# Patient Record
Sex: Male | Born: 1955 | Race: Black or African American | Hispanic: No | Marital: Married | Smoking: Former smoker
Health system: Southern US, Community
[De-identification: ages and names within clinical notes are randomized; demographics above are authoritative.]

## PROBLEM LIST (undated history)

## (undated) ENCOUNTER — Telehealth

## (undated) ENCOUNTER — Encounter: Attending: Family | Primary: Family

## (undated) ENCOUNTER — Encounter: Attending: Hematology & Oncology | Primary: Hematology & Oncology

## (undated) ENCOUNTER — Non-Acute Institutional Stay: Payer: PRIVATE HEALTH INSURANCE

## (undated) ENCOUNTER — Encounter: Payer: PRIVATE HEALTH INSURANCE | Attending: Family | Primary: Family

## (undated) ENCOUNTER — Telehealth
Attending: Pharmacist Clinician (PhC)/ Clinical Pharmacy Specialist | Primary: Pharmacist Clinician (PhC)/ Clinical Pharmacy Specialist

## (undated) ENCOUNTER — Encounter

## (undated) ENCOUNTER — Ambulatory Visit

## (undated) ENCOUNTER — Telehealth: Attending: Family | Primary: Family

## (undated) ENCOUNTER — Ambulatory Visit
Attending: Pharmacist Clinician (PhC)/ Clinical Pharmacy Specialist | Primary: Pharmacist Clinician (PhC)/ Clinical Pharmacy Specialist

## (undated) ENCOUNTER — Encounter
Attending: Pharmacist Clinician (PhC)/ Clinical Pharmacy Specialist | Primary: Pharmacist Clinician (PhC)/ Clinical Pharmacy Specialist

## (undated) ENCOUNTER — Ambulatory Visit: Payer: PRIVATE HEALTH INSURANCE

## (undated) ENCOUNTER — Encounter: Attending: Sports Medicine | Primary: Sports Medicine

## (undated) ENCOUNTER — Ambulatory Visit: Payer: PRIVATE HEALTH INSURANCE | Attending: Family | Primary: Family

## (undated) DIAGNOSIS — B1920 Unspecified viral hepatitis C without hepatic coma: Secondary | ICD-10-CM

## (undated) DIAGNOSIS — C189 Malignant neoplasm of colon, unspecified: Secondary | ICD-10-CM

## (undated) DIAGNOSIS — M1990 Unspecified osteoarthritis, unspecified site: Secondary | ICD-10-CM

## (undated) DIAGNOSIS — I10 Essential (primary) hypertension: Secondary | ICD-10-CM

## (undated) HISTORY — PX: SHOULDER ARTHROSCOPY: SHX128

## (undated) HISTORY — DX: Malignant neoplasm of colon, unspecified: C18.9

## (undated) HISTORY — DX: Essential (primary) hypertension: I10

## (undated) HISTORY — PX: WRIST SURGERY: SHX841

## (undated) HISTORY — PX: COLON SURGERY: SHX602

## (undated) HISTORY — PX: REPLACEMENT TOTAL KNEE: SUR1224

## (undated) MED FILL — Fluorouracil IV Soln 5 GM/100ML (50 MG/ML): INTRAVENOUS | Qty: 70 | Status: AC

## (undated) MED FILL — Fluorouracil IV Soln 2.5 GM/50ML (50 MG/ML): INTRAVENOUS | Qty: 12 | Status: AC

## (undated) MED FILL — Fluorouracil IV Soln 5 GM/100ML (50 MG/ML): INTRAVENOUS | Qty: 100 | Status: AC

---
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
## 2013-11-19 ENCOUNTER — Encounter: Payer: Self-pay | Admitting: Family

## 2013-11-19 ENCOUNTER — Ambulatory Visit (INDEPENDENT_AMBULATORY_CARE_PROVIDER_SITE_OTHER): Payer: Managed Care, Other (non HMO) | Admitting: Family

## 2013-11-19 VITALS — BP 140/90 | Temp 98.4°F | Ht 70.75 in | Wt 233.0 lb

## 2013-11-19 DIAGNOSIS — M79604 Pain in right leg: Secondary | ICD-10-CM

## 2013-11-19 DIAGNOSIS — IMO0002 Reserved for concepts with insufficient information to code with codable children: Secondary | ICD-10-CM

## 2013-11-19 DIAGNOSIS — M79609 Pain in unspecified limb: Secondary | ICD-10-CM

## 2013-11-19 DIAGNOSIS — M5416 Radiculopathy, lumbar region: Secondary | ICD-10-CM

## 2013-11-19 MED ORDER — IBUPROFEN 800 MG PO TABS: 800.0000 mg | ORAL_TABLET | Freq: Three times a day (TID) | ORAL | Status: DC | PRN

## 2013-11-19 MED ORDER — TRAMADOL HCL 50 MG PO TABS: 50.0000 mg | ORAL_TABLET | Freq: Two times a day (BID) | ORAL | Status: DC | PRN

## 2013-11-19 NOTE — Patient Instructions (Signed)
Back Exercises These exercises may help you when beginning to rehabilitate your injury. Your symptoms may resolve with or without further involvement from your physician, physical therapist or athletic trainer. While completing these exercises, remember:   Restoring tissue flexibility helps normal motion to return to the joints. This allows healthier, less painful movement and activity.  An effective stretch should be held for at least 30 seconds.  A stretch should never be painful. You should only feel a gentle lengthening or release in the stretched tissue. STRETCH  Extension, Prone on Elbows   Lie on your stomach on the floor, a bed will be too soft. Place your palms about shoulder width apart and at the height of your head.  Place your elbows under your shoulders. If this is too painful, stack pillows under your chest.  Allow your body to relax so that your hips drop lower and make contact more completely with the floor.  Hold this position for __________ seconds.  Slowly return to lying flat on the floor. Repeat __________ times. Complete this exercise __________ times per day.  RANGE OF MOTION  Extension, Prone Press Ups   Lie on your stomach on the floor, a bed will be too soft. Place your palms about shoulder width apart and at the height of your head.  Keeping your back as relaxed as possible, slowly straighten your elbows while keeping your hips on the floor. You may adjust the placement of your hands to maximize your comfort. As you gain motion, your hands will come more underneath your shoulders.  Hold this position __________ seconds.  Slowly return to lying flat on the floor. Repeat __________ times. Complete this exercise __________ times per day.  RANGE OF MOTION- Quadruped, Neutral Spine   Assume a hands and knees position on a firm surface. Keep your hands under your shoulders and your knees under your hips. You may place padding under your knees for comfort.  Drop  your head and point your tail bone toward the ground below you. This will round out your low back like an angry cat. Hold this position for __________ seconds.  Slowly lift your head and release your tail bone so that your back sags into a large arch, like an old horse.  Hold this position for __________ seconds.  Repeat this until you feel limber in your low back.  Now, find your "sweet spot." This will be the most comfortable position somewhere between the two previous positions. This is your neutral spine. Once you have found this position, tense your stomach muscles to support your low back.  Hold this position for __________ seconds. Repeat __________ times. Complete this exercise __________ times per day.  STRETCH  Flexion, Single Knee to Chest   Lie on a firm bed or floor with both legs extended in front of you.  Keeping one leg in contact with the floor, bring your opposite knee to your chest. Hold your leg in place by either grabbing behind your thigh or at your knee.  Pull until you feel a gentle stretch in your low back. Hold __________ seconds.  Slowly release your grasp and repeat the exercise with the opposite side. Repeat __________ times. Complete this exercise __________ times per day.  STRETCH - Hamstrings, Standing  Stand or sit and extend your right / left leg, placing your foot on a chair or foot stool  Keeping a slight arch in your low back and your hips straight forward.  Lead with your chest and   lean forward at the waist until you feel a gentle stretch in the back of your right / left knee or thigh. (When done correctly, this exercise requires leaning only a small distance.)  Hold this position for __________ seconds. Repeat __________ times. Complete this stretch __________ times per day. STRENGTHENING  Deep Abdominals, Pelvic Tilt   Lie on a firm bed or floor. Keeping your legs in front of you, bend your knees so they are both pointed toward the ceiling and  your feet are flat on the floor.  Tense your lower abdominal muscles to press your low back into the floor. This motion will rotate your pelvis so that your tail bone is scooping upwards rather than pointing at your feet or into the floor.  With a gentle tension and even breathing, hold this position for __________ seconds. Repeat __________ times. Complete this exercise __________ times per day.  STRENGTHENING  Abdominals, Crunches   Lie on a firm bed or floor. Keeping your legs in front of you, bend your knees so they are both pointed toward the ceiling and your feet are flat on the floor. Cross your arms over your chest.  Slightly tip your chin down without bending your neck.  Tense your abdominals and slowly lift your trunk high enough to just clear your shoulder blades. Lifting higher can put excessive stress on the low back and does not further strengthen your abdominal muscles.  Control your return to the starting position. Repeat __________ times. Complete this exercise __________ times per day.  STRENGTHENING  Quadruped, Opposite UE/LE Lift   Assume a hands and knees position on a firm surface. Keep your hands under your shoulders and your knees under your hips. You may place padding under your knees for comfort.  Find your neutral spine and gently tense your abdominal muscles so that you can maintain this position. Your shoulders and hips should form a rectangle that is parallel with the floor and is not twisted.  Keeping your trunk steady, lift your right hand no higher than your shoulder and then your left leg no higher than your hip. Make sure you are not holding your breath. Hold this position __________ seconds.  Continuing to keep your abdominal muscles tense and your back steady, slowly return to your starting position. Repeat with the opposite arm and leg. Repeat __________ times. Complete this exercise __________ times per day. Document Released: 12/15/2005 Document  Revised: 02/19/2012 Document Reviewed: 03/11/2009 [facility] Patient Information 2014 [place], [place].    Lumbosacral Radiculopathy Lumbosacral radiculopathy is a pinched nerve or nerves in the low back (lumbosacral area). When this happens you may have weakness in your legs and may not be able to stand on your toes. You may have pain going down into your legs. There may be difficulties with walking normally. There are many causes of this problem. Sometimes this may happen from an injury, or simply from arthritis or boney problems. It may also be caused by other illnesses such as diabetes. If there is no improvement after treatment, further studies may be done to find the exact cause. DIAGNOSIS  X-rays may be needed if the problems become long standing. Electromyograms may be done. This study is one in which the working of nerves and muscles is studied. HOME CARE INSTRUCTIONS   Applications of ice packs may be helpful. Ice can be used in a plastic bag with a towel around it to prevent frostbite to skin. This may be used every 2 hours for 20  to 30 minutes, or as needed, while awake, or as directed by your caregiver.  Only take over-the-counter or prescription medicines for pain, discomfort, or fever as directed by your caregiver.  If physical therapy was prescribed, follow your caregiver's directions. SEEK IMMEDIATE MEDICAL CARE IF:   You have pain not controlled with medications.  You seem to be getting worse rather than better.  You develop increasing weakness in your legs.  You develop loss of bowel or bladder control.  You have difficulty with walking or balance, or develop clumsiness in the use of your legs.  You have a fever. MAKE SURE YOU:   Understand these instructions.  Will watch your condition.  Will get help right away if you are not doing well or get worse. Document Released: 11/27/2005 Document Revised: 02/19/2012 Document Reviewed: 07/17/2008 [facility] Patient  Information 2014 [place], [place].

## 2013-11-19 NOTE — Progress Notes (Signed)
Subjective:    Patient ID: Evan [name], male    DOB: December 28, 1955, 57 y.o.   MRN: [id number]  HPI  This 57 year old the patient to the practice is then to be established. He has complaints of right upper leg pain ongoing several months for which she's been taken 100 mg ibuprofen and tramadol for relief. He is a Education administrator. With the pain tends to be worse after a days work. The symptoms and pain are relieved with tramadol and ibuprofen. He has a history of a bulging disc to the L-spine. Denies any current back pain. He rates the pain as 10 out of 10 when it occurs and describes it as a catch and sharp. He has not seen a primary care provider in over 30 years. Reports a history of drug dependence in the past.   Review of Systems  Constitutional: Negative.   HENT: Negative.   Respiratory: Negative.   Cardiovascular: Negative.   Gastrointestinal: Negative.   Endocrine: Negative.   Genitourinary: Negative.   Musculoskeletal: Negative.   Skin: Negative.   Allergic/Immunologic: Negative.   Neurological: Negative.   Hematological: Negative.   Psychiatric/Behavioral: Negative.    History reviewed. No pertinent past medical history.  History   Social History  . Marital Status: Widowed    Spouse Name: N/A    Number of Children: N/A  . Years of Education: N/A   Occupational History  . Not on file.   Social History Main Topics  . Smoking status: Former Smoker    Quit date: 11/19/2005  . Smokeless tobacco: Not on file  . Alcohol Use: No  . Drug Use: No  . Sexual Activity: Not on file   Other Topics Concern  . Not on file   Social History Narrative  . No narrative on file    Past Surgical History  Procedure Laterality Date  . Shoulder arthroscopy    . Wrist surgery Left     Family History  Problem Relation Age of Onset  . Heart disease Mother   . Dementia Father     No Known Allergies  No current outpatient prescriptions on file prior to visit.   No current  facility-administered medications on file prior to visit.    BP 140/90  Temp(Src) 98.4 F (36.9 C) (Oral)  Ht 5' 10.75" (1.797 m)  Wt 233 lb (105.688 kg)  BMI 32.73 kg/m2chart    Objective:   Physical Exam  Constitutional: He is oriented to person, place, and time. He appears well-developed and well-nourished.  HENT:  Right Ear: External ear normal.  Left Ear: External ear normal.  Nose: Nose normal.  Mouth/Throat: Oropharynx is clear and moist.  Neck: Normal range of motion. Neck supple. No thyromegaly present.  Cardiovascular: Normal rate, regular rhythm and normal heart sounds.   Pulmonary/Chest: Effort normal and breath sounds normal.  Abdominal: Soft. Bowel sounds are normal.  Musculoskeletal: Normal range of motion. He exhibits no edema and no tenderness.  Neurological: He is alert and oriented to person, place, and time. He has normal reflexes. He displays normal reflexes. No cranial nerve deficit. Coordination normal.  Skin: Skin is warm and dry.  Psychiatric: He has a normal mood and affect.          Assessment & Plan:  Assessment: 1. Lumbar radiculopathy 2. Right leg pain  Plan: Low back strengthening exercises provided for patient today. Refill prescription for tramadol as needed and ibuprofen 3 times a day with food. Consider Prilosec daily. Will bring  patient back for recheck and a complete physical exam in 2-3 weeks and sooner as needed fasting.

## 2013-11-20 LAB — COMPREHENSIVE METABOLIC PANEL
ALT: 53 U/L (ref 0–53)
AST: 59 U/L — ABNORMAL HIGH (ref 0–37)
Alkaline Phosphatase: 55 U/L (ref 39–117)
BUN: 10 mg/dL (ref 6–23)
Creatinine, Ser: 1 mg/dL (ref 0.4–1.5)
GFR: 102.91 mL/min (ref >60.00)
Total Bilirubin: 0.8 mg/dL (ref 0.3–1.2)

## 2013-12-09 ENCOUNTER — Ambulatory Visit: Payer: Managed Care, Other (non HMO)

## 2013-12-09 ENCOUNTER — Other Ambulatory Visit (INDEPENDENT_AMBULATORY_CARE_PROVIDER_SITE_OTHER): Payer: Managed Care, Other (non HMO)

## 2013-12-09 DIAGNOSIS — D729 Disorder of white blood cells, unspecified: Secondary | ICD-10-CM

## 2013-12-09 DIAGNOSIS — Z0000 Encounter for general adult medical examination without abnormal findings: Secondary | ICD-10-CM

## 2013-12-09 LAB — CBC WITH DIFFERENTIAL/PLATELET
Basophils Relative: 0.6 % (ref 0.0–3.0)
Eosinophils Absolute: 0.1 10*3/uL (ref 0.0–0.7)
Hemoglobin: 14.4 g/dL (ref 13.0–17.0)
MCHC: 33.7 g/dL (ref 30.0–36.0)
MCV: 95.3 fl (ref 78.0–100.0)
Monocytes Absolute: 0.7 10*3/uL (ref 0.1–1.0)
Neutro Abs: 2.5 10*3/uL (ref 1.4–7.7)
Neutrophils Relative %: 30.6 % — ABNORMAL LOW (ref 43.0–77.0)
RBC: 4.48 Mil/uL (ref 4.22–5.81)

## 2013-12-09 LAB — HEPATIC FUNCTION PANEL
Albumin: 3.9 g/dL (ref 3.5–5.2)
Bilirubin, Direct: 0.3 mg/dL (ref 0.0–0.3)
Total Protein: 7.2 g/dL (ref 6.0–8.3)

## 2013-12-09 LAB — BASIC METABOLIC PANEL
CO2: 26 mEq/L (ref 19–32)
Chloride: 104 mEq/L (ref 96–112)
Creatinine, Ser: 1 mg/dL (ref 0.4–1.5)
Glucose, Bld: 134 mg/dL — ABNORMAL HIGH (ref 70–99)
Potassium: 3.6 mEq/L (ref 3.5–5.1)
Sodium: 138 mEq/L (ref 135–145)

## 2013-12-09 LAB — PSA: PSA: 0.37 ng/mL (ref 0.10–4.00)

## 2013-12-09 LAB — POCT URINALYSIS DIPSTICK
Ketones, UA: NEGATIVE
Leukocytes, UA: NEGATIVE
Nitrite, UA: NEGATIVE
Protein, UA: NEGATIVE
pH, UA: 6

## 2013-12-10 ENCOUNTER — Telehealth: Payer: Self-pay

## 2013-12-10 DIAGNOSIS — R7989 Other specified abnormal findings of blood chemistry: Secondary | ICD-10-CM

## 2013-12-10 LAB — PATHOLOGIST SMEAR REVIEW

## 2013-12-10 MED ORDER — DICLOFENAC SODIUM 75 MG PO TBEC: 75.0000 mg | DELAYED_RELEASE_TABLET | Freq: Two times a day (BID) | ORAL | Status: DC

## 2013-12-10 NOTE — Telephone Encounter (Signed)
Message copied by Beverely Low on Wed Dec 10, 2013  1:37 PM ------      Message from: Adline Mango B      Created: Wed Dec 10, 2013  8:40 AM       Elevated LFTs. Needs ultrasound of the liver. Avoid NSAIDS, Tylenol, alcohol. ------

## 2013-12-16 ENCOUNTER — Ambulatory Visit (INDEPENDENT_AMBULATORY_CARE_PROVIDER_SITE_OTHER): Payer: Managed Care, Other (non HMO) | Admitting: Family

## 2013-12-16 ENCOUNTER — Encounter: Payer: Self-pay | Admitting: Family

## 2013-12-16 VITALS — BP 140/90 | HR 62 | Ht 70.75 in | Wt 229.0 lb

## 2013-12-16 DIAGNOSIS — R7309 Other abnormal glucose: Secondary | ICD-10-CM

## 2013-12-16 DIAGNOSIS — R739 Hyperglycemia, unspecified: Secondary | ICD-10-CM

## 2013-12-16 DIAGNOSIS — R945 Abnormal results of liver function studies: Secondary | ICD-10-CM

## 2013-12-16 DIAGNOSIS — Z0000 Encounter for general adult medical examination without abnormal findings: Secondary | ICD-10-CM

## 2013-12-16 DIAGNOSIS — R7989 Other specified abnormal findings of blood chemistry: Secondary | ICD-10-CM

## 2013-12-16 LAB — HEPATIC FUNCTION PANEL
ALK PHOS: 64 U/L (ref 39–117)
ALT: 65 U/L — AB (ref 0–53)
AST: 63 U/L — ABNORMAL HIGH (ref 0–37)
Albumin: 4.2 g/dL (ref 3.5–5.2)
BILIRUBIN DIRECT: 0.2 mg/dL (ref 0.0–0.3)
Total Bilirubin: 1.1 mg/dL (ref 0.3–1.2)
Total Protein: 8.3 g/dL (ref 6.0–8.3)

## 2013-12-16 LAB — HEMOGLOBIN A1C: Hgb A1c MFr Bld: 5.4 % (ref 4.6–6.5)

## 2013-12-16 NOTE — Progress Notes (Signed)
Pre visit review using our clinic review tool, if applicable. No additional management support is needed unless otherwise documented below in the visit note. 

## 2013-12-16 NOTE — Progress Notes (Signed)
Subjective:    Patient ID: Evan [name], male    DOB: 1956-04-28, 58 y.o.   MRN: [id number]  HPI 58 year old [place] American male, nonsmoker  Patient presents for yearly preventative medicine examination. All immunizations and health maintenance protocols were reviewed with the patient and they are up to date with these protocols. Screening laboratory values were reviewed with the patient including screening of hyperlipidemia PSA renal function and hepatic function. There medications past medical history social history problem list and allergies were reviewed in detail.     Review of Systems  Constitutional: Negative.   HENT: Negative.   Eyes: Negative.   Respiratory: Negative.   Cardiovascular: Negative.   Gastrointestinal: Negative.   Endocrine: Negative.   Genitourinary: Negative.   Musculoskeletal: Negative.   Skin: Negative.   Allergic/Immunologic: Negative.   Neurological: Negative.   Hematological: Negative.   Psychiatric/Behavioral: Negative.    History reviewed. No pertinent past medical history.  History   Social History  . Marital Status: Widowed    Spouse Name: N/A    Number of Children: N/A  . Years of Education: N/A   Occupational History  . Not on file.   Social History Main Topics  . Smoking status: Former Smoker    Quit date: 11/19/2005  . Smokeless tobacco: Not on file  . Alcohol Use: No  . Drug Use: No  . Sexual Activity: Not on file   Other Topics Concern  . Not on file   Social History Narrative  . No narrative on file    Past Surgical History  Procedure Laterality Date  . Shoulder arthroscopy    . Wrist surgery Left     Family History  Problem Relation Age of Onset  . Heart disease Mother   . Dementia Father     No Known Allergies  Current Outpatient Prescriptions on File Prior to Visit  Medication Sig Dispense Refill  . diclofenac (VOLTAREN) 75 MG EC tablet Take 1 tablet (75 mg total) by mouth 2 (two) times daily.   60 tablet  4  . traMADol (ULTRAM) 50 MG tablet Take 1 tablet (50 mg total) by mouth 2 (two) times daily as needed.  30 tablet  0   No current facility-administered medications on file prior to visit.    BP 140/90  Pulse 62  Ht 5' 10.75" (1.797 m)  Wt 229 lb (103.874 kg)  BMI 32.17 kg/m2chart    Objective:   Physical Exam  Constitutional: He is oriented to person, place, and time. He appears well-developed and well-nourished.  HENT:  Head: Normocephalic and atraumatic.  Right Ear: External ear normal.  Left Ear: External ear normal.  Nose: Nose normal.  Mouth/Throat: Oropharynx is clear and moist.  Eyes: Conjunctivae and EOM are normal. Pupils are equal, round, and reactive to light.  Neck: Normal range of motion. Neck supple.  Cardiovascular: Normal rate, regular rhythm and normal heart sounds.   Pulmonary/Chest: Breath sounds normal.  Abdominal: Soft. Bowel sounds are normal.  Genitourinary: Rectum normal, prostate normal and penis normal.  Musculoskeletal: Normal range of motion. He exhibits no tenderness.  Neurological: He is alert and oriented to person, place, and time. He displays normal reflexes. No cranial nerve deficit. Coordination normal.  Skin: Skin is warm and dry.  Psychiatric: He has a normal mood and affect.  EKG in office.        Assessment & Plan:  Assessment: 1. CPX 2. Elevated LFT related to chrinic Ibuprofen usage 3. Hyperglycemia  Plan Referral to [facility] for liver ultrasound.   Obtain labs that include LFT and hemoglobin A1C.  Continue leg exercises.  Contact office for any questions or concerns. Proceed with Ultrasound if still elevated.

## 2013-12-16 NOTE — Patient Instructions (Signed)
High Blood Sugar High blood sugar (hyperglycemia) means that the level of sugar in your blood is higher than it should be. Signs of high blood sugar include:  Feeling thirsty.  Frequent peeing (urinating).  Feeling tired or sleepy.  Dry mouth.  Vision changes.  Feeling weak.  Feeling hungry but losing weight.  Numbness and tingling in your hands or feet.  Headache. When you ignore these signs, your blood sugar may keep going up. These problems may get worse, and other problems may begin. HOME CARE  Check your blood sugars as told by your doctor. Write down the numbers with the date and time.  Take the right amount of insulin or diabetes pills at the right time. Write down the dose with date and time.  Refill your insulin or diabetes pills before running out.  Watch what you eat. Follow your meal plan.  Drink liquids without sugar, such as water. Check with your doctor if you have kidney or heart disease.  Follow your doctor's orders for exercise. Exercise at the same time of day.  Keep your doctor's appointments. GET HELP RIGHT AWAY IF:   You have trouble thinking or are confused.  You have fast breathing with fruity smelling breath.  You pass out (faint).  You have 2 to 3 days of high blood sugars and you do not know why.  You have chest pain.  You are feeling sick to your stomach (nauseous) or throwing up (vomiting).  You have sudden vision changes. MAKE SURE YOU:   Understand these instructions.  Will watch your condition.  Will get help right away if you are not doing well or get worse. Document Released: 09/24/2009 Document Revised: 02/19/2012 Document Reviewed: 09/24/2009 [facility] Patient Information 2014 [place], [place].

## 2013-12-18 ENCOUNTER — Other Ambulatory Visit: Payer: Self-pay | Admitting: Family

## 2013-12-18 ENCOUNTER — Other Ambulatory Visit (INDEPENDENT_AMBULATORY_CARE_PROVIDER_SITE_OTHER): Payer: Managed Care, Other (non HMO)

## 2013-12-18 DIAGNOSIS — D72829 Elevated white blood cell count, unspecified: Secondary | ICD-10-CM

## 2013-12-18 LAB — CBC WITH DIFFERENTIAL/PLATELET
BASOS ABS: 0 10*3/uL (ref 0.0–0.1)
Basophils Relative: 0.5 % (ref 0.0–3.0)
EOS ABS: 0.1 10*3/uL (ref 0.0–0.7)
Eosinophils Relative: 1.7 % (ref 0.0–5.0)
HCT: 42.2 % (ref 39.0–52.0)
Hemoglobin: 14.2 g/dL (ref 13.0–17.0)
Lymphocytes Relative: 58 % — ABNORMAL HIGH (ref 12.0–46.0)
Lymphs Abs: 4.5 10*3/uL — ABNORMAL HIGH (ref 0.7–4.0)
MCHC: 33.7 g/dL (ref 30.0–36.0)
MCV: 94.4 fl (ref 78.0–100.0)
MONO ABS: 0.5 10*3/uL (ref 0.1–1.0)
Monocytes Relative: 7 % (ref 3.0–12.0)
NEUTROS ABS: 2.5 10*3/uL (ref 1.4–7.7)
NEUTROS PCT: 32.8 % — AB (ref 43.0–77.0)
Platelets: 162 10*3/uL (ref 150.0–400.0)
RBC: 4.47 Mil/uL (ref 4.22–5.81)
RDW: 13 % (ref 11.5–14.6)
WBC: 7.8 10*3/uL (ref 4.5–10.5)

## 2013-12-19 LAB — HIV ANTIBODY (ROUTINE TESTING W REFLEX): HIV: NONREACTIVE

## 2013-12-22 ENCOUNTER — Ambulatory Visit
Admission: RE | Admit: 2013-12-22 | Discharge: 2013-12-22 | Disposition: A | Discharge status: Home / Self Care | Payer: Managed Care, Other (non HMO) | Source: Ambulatory Visit | Attending: Family | Admitting: Family

## 2013-12-22 DIAGNOSIS — R7989 Other specified abnormal findings of blood chemistry: Secondary | ICD-10-CM

## 2013-12-22 DIAGNOSIS — R945 Abnormal results of liver function studies: Principal | ICD-10-CM

## 2014-01-07 ENCOUNTER — Encounter: Payer: Self-pay | Admitting: Family

## 2014-02-24 ENCOUNTER — Telehealth: Payer: Self-pay | Admitting: Family

## 2014-02-24 MED ORDER — IBUPROFEN 600 MG PO TABS: 600.0000 mg | ORAL_TABLET | Freq: Two times a day (BID) | ORAL | Status: DC | PRN

## 2014-02-24 NOTE — Telephone Encounter (Signed)
Pt is needing rx ibuprofen, states he only has 4 pills left, also wants to inform padonda that the exercises she told him to do is working and he is not having to take as much medicine for the pain. Send to [facility]

## 2014-02-24 NOTE — Telephone Encounter (Signed)
Rx sent 

## 2015-08-11 ENCOUNTER — Ambulatory Visit (INDEPENDENT_AMBULATORY_CARE_PROVIDER_SITE_OTHER): Payer: Self-pay | Admitting: Family

## 2015-08-11 ENCOUNTER — Ambulatory Visit (INDEPENDENT_AMBULATORY_CARE_PROVIDER_SITE_OTHER)
Admission: RE | Admit: 2015-08-11 | Discharge: 2015-08-11 | Disposition: A | Discharge status: Home / Self Care | Payer: Self-pay | Source: Ambulatory Visit | Attending: Family | Admitting: Family

## 2015-08-11 ENCOUNTER — Encounter: Payer: Self-pay | Admitting: Family

## 2015-08-11 ENCOUNTER — Ambulatory Visit: Payer: Self-pay | Admitting: Family

## 2015-08-11 VITALS — BP 120/90 | HR 60 | Temp 98.3°F | Wt 222.0 lb

## 2015-08-11 DIAGNOSIS — M5416 Radiculopathy, lumbar region: Secondary | ICD-10-CM

## 2015-08-11 DIAGNOSIS — M1710 Unilateral primary osteoarthritis, unspecified knee: Secondary | ICD-10-CM

## 2015-08-11 DIAGNOSIS — M545 Low back pain: Secondary | ICD-10-CM

## 2015-08-11 DIAGNOSIS — M549 Dorsalgia, unspecified: Secondary | ICD-10-CM

## 2015-08-11 DIAGNOSIS — IMO0002 Reserved for concepts with insufficient information to code with codable children: Secondary | ICD-10-CM

## 2015-08-11 DIAGNOSIS — M179 Osteoarthritis of knee, unspecified: Secondary | ICD-10-CM

## 2015-08-11 DIAGNOSIS — G8929 Other chronic pain: Secondary | ICD-10-CM

## 2015-08-11 MED ORDER — HYDROCODONE-ACETAMINOPHEN 10-325 MG PO TABS: 1.0000 | ORAL_TABLET | Freq: Three times a day (TID) | ORAL | Status: DC | PRN

## 2015-08-11 MED ORDER — PREDNISONE 20 MG PO TABS: ORAL_TABLET | ORAL | Status: AC

## 2015-08-11 NOTE — Progress Notes (Signed)
Pre visit review using our clinic review tool, if applicable. No additional management support is needed unless otherwise documented below in the visit note. 

## 2015-08-11 NOTE — Patient Instructions (Signed)

## 2015-08-17 DIAGNOSIS — M549 Dorsalgia, unspecified: Secondary | ICD-10-CM

## 2015-08-17 DIAGNOSIS — IMO0002 Reserved for concepts with insufficient information to code with codable children: Secondary | ICD-10-CM | POA: Insufficient documentation

## 2015-08-17 DIAGNOSIS — M1710 Unilateral primary osteoarthritis, unspecified knee: Secondary | ICD-10-CM | POA: Insufficient documentation

## 2015-08-17 DIAGNOSIS — G8929 Other chronic pain: Secondary | ICD-10-CM | POA: Insufficient documentation

## 2015-08-17 NOTE — Progress Notes (Signed)
Subjective:    Patient ID: Evan [name], male    DOB: 1956/04/04, 59 y.o.   MRN: [id number]  HPI 59 year old African-American male, with a history of osteoarthritis is in today with complaints of joint pain in his knees particularly his lower back. He had past inflammatory medication without much relief. Rates the pain a 7 out of 10, worse with movement. Denies any acute injury describes the pain as a dull ache that tends to be worse first thing in the morning. Reports the pain in his back radiating down his right leg into his thigh.   Review of Systems  Constitutional: Negative.   Respiratory: Negative.   Cardiovascular: Negative.   Gastrointestinal: Negative.   Endocrine: Negative.   Genitourinary: Negative.   Musculoskeletal: Positive for back pain and arthralgias.  Skin: Negative.   Neurological: Negative.  Negative for dizziness, tremors and weakness.  Hematological: Negative.   Psychiatric/Behavioral: Negative.    No past medical history on file.  Social History   Social History  . Marital Status: Widowed    Spouse Name: N/A  . Number of Children: N/A  . Years of Education: N/A   Occupational History  . Not on file.   Social History Main Topics  . Smoking status: Former Smoker    Quit date: 11/19/2005  . Smokeless tobacco: Not on file  . Alcohol Use: No  . Drug Use: No  . Sexual Activity: Not on file   Other Topics Concern  . Not on file   Social History Narrative    Past Surgical History  Procedure Laterality Date  . Shoulder arthroscopy    . Wrist surgery Left     Family History  Problem Relation Age of Onset  . Heart disease Mother   . Dementia Father     No Known Allergies  Current Outpatient Prescriptions on File Prior to Visit  Medication Sig Dispense Refill  . diclofenac (VOLTAREN) 75 MG EC tablet Take 1 tablet (75 mg total) by mouth 2 (two) times daily. 60 tablet 4  . traMADol (ULTRAM) 50 MG tablet Take 1 tablet (50 mg total) by  mouth 2 (two) times daily as needed. 30 tablet 0   No current facility-administered medications on file prior to visit.    BP 120/90 mmHg  Pulse 60  Temp(Src) 98.3 F (36.8 C) (Oral)  Wt 222 lb (100.699 kg)chart    Objective:   Physical Exam  Constitutional: He is oriented to person, place, and time. He appears well-developed and well-nourished.  Neck: Normal range of motion. Neck supple. No thyromegaly present.  Cardiovascular: Normal rate, regular rhythm and normal heart sounds.   Pulmonary/Chest: Effort normal and breath sounds normal.  Abdominal: Soft. Bowel sounds are normal.  Musculoskeletal: He exhibits no edema or tenderness.  Has about a 90 flexion at the hip before pain is elicited. No tenderness to palpation of the spinosis process. Minimal pain with lateral movement.  Neurological: He is alert and oriented to person, place, and time. He has normal reflexes. No cranial nerve deficit. Coordination normal.  Positive straight leg raise on the right  Skin: Skin is warm.  Psychiatric: He has a normal mood and affect.          Assessment & Plan:  Evan [name] was seen today for generalized body aches.  Diagnoses and all orders for this visit:  Chronic low back pain -     DG Lumbar Spine Complete; Future  Lumbar radiculopathy, chronic -  DG Lumbar Spine Complete; Future  Other orders -     HYDROcodone-acetaminophen (NORCO) 10-325 MG per tablet; Take 1 tablet by mouth every 8 (eight) hours as needed. -     predniSONE (DELTASONE) 20 MG tablet; '60mg'$  PO qam x 3 days, '40mg'$  po qam x 3 days, '20mg'$  qam x 3 days   Suspect osteoarthritis versus lumbar radiculopathy. We'll treat with prednisone provided pain medication. Consider MRI symptoms persist. Call the office with any questions or concerns.

## 2015-08-25 ENCOUNTER — Telehealth: Payer: Self-pay | Admitting: Family

## 2015-08-25 DIAGNOSIS — M47819 Spondylosis without myelopathy or radiculopathy, site unspecified: Secondary | ICD-10-CM

## 2015-08-25 MED ORDER — HYDROCODONE-ACETAMINOPHEN 10-325 MG PO TABS: 1.0000 | ORAL_TABLET | Freq: Three times a day (TID) | ORAL | Status: DC | PRN

## 2015-08-25 NOTE — Telephone Encounter (Signed)
Advanced osteoarthritis. We can provide 1 last RX. Refer to pain clinic

## 2015-08-25 NOTE — Telephone Encounter (Signed)
Spoke to the pt.  Advised that the x-ray showed advanced osteoarthritis and that Evan [name] will be providing one more pain prescription.  Will start working on pain management referral.

## 2015-08-25 NOTE — Telephone Encounter (Signed)
Pt states he is still having that hip pain he saw padonda for 2 weeks ago. Pt would like to know what The xray said.  Pt also got the HYDROcodone-acetaminophen (NORCO) 10-325 MG per tablet 2 weeks ago, and they worked just great.  Pt states he wants some more of those.

## 2015-09-08 ENCOUNTER — Telehealth: Payer: Self-pay | Admitting: Family

## 2015-09-08 NOTE — Telephone Encounter (Signed)
Approve refill for quantity requested?

## 2015-09-08 NOTE — Telephone Encounter (Signed)
Pt is calling to request refill on HYDROcodone-acetaminophen (NORCO) 10-325 MG per tablet. He states he is only being given enough for 10 days at a time and needs more as he is taking atleast 3 per day. He also states he cannot work through the pain without them and hasn't gotten into a pain clinic as of yet.

## 2015-09-10 NOTE — Telephone Encounter (Signed)
One time only.

## 2015-09-13 ENCOUNTER — Other Ambulatory Visit: Payer: Self-pay | Admitting: *Deleted

## 2015-09-13 MED ORDER — HYDROCODONE-ACETAMINOPHEN 10-325 MG PO TABS: 1.0000 | ORAL_TABLET | Freq: Three times a day (TID) | ORAL | Status: DC | PRN

## 2015-09-13 NOTE — Telephone Encounter (Signed)
Rx printed

## 2015-09-13 NOTE — Telephone Encounter (Signed)
Rx printed and attempted to call patient it was ready to be picked up. Voicemail not accepting messages.

## 2015-09-13 NOTE — Telephone Encounter (Signed)
Rx printed and attempted to call patient that Rx for Hydrocodone-acetaminophen (Norco) was ready to be picked up. Voicemail was not accepting messages.

## 2015-10-08 ENCOUNTER — Telehealth: Payer: Self-pay | Admitting: Family

## 2015-10-08 NOTE — Telephone Encounter (Signed)
Pt needs new rx hydrocodone °

## 2015-10-12 NOTE — Telephone Encounter (Signed)
Patient was referred to pain clinic but referral is still under pending review as of 10/08/15. Could we do a 1 time refill until appointment is made with pain clinic?

## 2015-10-12 NOTE — Telephone Encounter (Signed)
Last 2 rx said 1x only. This cannot be done without an office visit and even then cannot guarantee this will be given. Would also need status update from pain clinic. Also Dr. Justin Mend works on [facility]- suggest this be brought to her attention

## 2015-10-13 ENCOUNTER — Ambulatory Visit (INDEPENDENT_AMBULATORY_CARE_PROVIDER_SITE_OTHER): Payer: Self-pay | Admitting: Family

## 2015-10-13 ENCOUNTER — Encounter: Payer: Self-pay | Admitting: Family

## 2015-10-13 VITALS — BP 124/70 | HR 82 | Temp 98.0°F | Wt 216.9 lb

## 2015-10-13 DIAGNOSIS — M545 Low back pain: Secondary | ICD-10-CM

## 2015-10-13 DIAGNOSIS — G8929 Other chronic pain: Secondary | ICD-10-CM

## 2015-10-13 DIAGNOSIS — M5416 Radiculopathy, lumbar region: Secondary | ICD-10-CM

## 2015-10-13 MED ORDER — PREDNISONE 20 MG PO TABS: 20.0000 mg | ORAL_TABLET | Freq: Every day | ORAL | Status: DC

## 2015-10-13 MED ORDER — HYDROCODONE-ACETAMINOPHEN 10-325 MG PO TABS: 1.0000 | ORAL_TABLET | Freq: Three times a day (TID) | ORAL | Status: DC | PRN

## 2015-10-13 NOTE — Telephone Encounter (Signed)
Pt wife will contact her husband and callback

## 2015-10-13 NOTE — Progress Notes (Signed)
Pre visit review using our clinic review tool, if applicable. No additional management support is needed unless otherwise documented below in the visit note. 

## 2015-10-13 NOTE — Patient Instructions (Signed)

## 2015-10-13 NOTE — Telephone Encounter (Signed)
Can you please schedule appointment for patient to be seen by [facility] for medication follow-up. If able, Abby Potash is here tomorrow and patient can be seen then.

## 2015-10-13 NOTE — Progress Notes (Signed)
Subjective:    Patient ID: Evan [name], male    DOB: [birth year], 59 y.o.   MRN: [id number]  HPI 59 year old male is in today with continued c/o chronic low back pain that is relieved with prednisone and hydrocodone. Requesting a referral to the pain clinic. Pain typically  8/10, very debilitating and keeps him from working when he is unable to take pain medication. I have given him a couple scripts at this point but I have advised primary care does not treat chronic pain. Due to the complexity he will have to see a specialist.   Review of Systems  Constitutional: Negative.   Respiratory: Negative.   Cardiovascular: Negative.   Gastrointestinal: Negative.   Musculoskeletal: Positive for back pain and arthralgias. Negative for neck pain.  Skin: Negative.   Allergic/Immunologic: Negative.   Neurological: Negative.   Psychiatric/Behavioral: Negative.    History reviewed. No pertinent past medical history.  Social History   Social History  . Marital Status: Widowed    Spouse Name: N/A  . Number of Children: N/A  . Years of Education: N/A   Occupational History  . Not on file.   Social History Main Topics  . Smoking status: Former Smoker    Quit date: 11/19/2005  . Smokeless tobacco: Not on file  . Alcohol Use: No  . Drug Use: No  . Sexual Activity: Not on file   Other Topics Concern  . Not on file   Social History Narrative    Past Surgical History  Procedure Laterality Date  . Shoulder arthroscopy    . Wrist surgery Left     Family History  Problem Relation Age of Onset  . Heart disease Mother   . Dementia Father     No Known Allergies  Current Outpatient Prescriptions on File Prior to Visit  Medication Sig Dispense Refill  . diclofenac (VOLTAREN) 75 MG EC tablet Take 1 tablet (75 mg total) by mouth 2 (two) times daily. 60 tablet 4  . traMADol (ULTRAM) 50 MG tablet Take 1 tablet (50 mg total) by mouth 2 (two) times daily as needed. 30 tablet 0    No current facility-administered medications on file prior to visit.    BP 124/70 mmHg  Pulse 82  Temp(Src) 98 F (36.7 C) (Oral)  Wt 216 lb 14.4 oz (98.385 kg)chart     Objective:   Physical Exam  Constitutional: He is oriented to person, place, and time. He appears well-developed and well-nourished.  Neck: Normal range of motion. Neck supple.  Cardiovascular: Normal rate, regular rhythm and normal heart sounds.   Pulmonary/Chest: Effort normal and breath sounds normal.  Musculoskeletal: He exhibits tenderness. He exhibits no edema.  Negative straight leg raise. Tenderness to palpation to the lower lumbar spine.  Neurological: He is alert and oriented to person, place, and time. He has normal reflexes. He displays normal reflexes. No cranial nerve deficit. Coordination normal.  Skin: Skin is warm and dry.  Psychiatric: He has a normal mood and affect.          Assessment & Plan:  Heyden was seen today for follow-up.  Diagnoses and all orders for this visit:  Chronic low back pain -     Ambulatory referral to Pain Clinic  Lumbar radiculopathy, chronic -     Ambulatory referral to Pain Clinic  Other orders -     HYDROcodone-acetaminophen (NORCO) 10-325 MG tablet; Take 1 tablet by mouth every 8 (eight) hours as needed. -  predniSONE (DELTASONE) 20 MG tablet; Take 1 tablet (20 mg total) by mouth daily with breakfast.   Call the office with any medical questions or concerns. Follow-up with pain clinic for further management. No more prescription from here will be able to be provided after today. Prednisone Norco filled. Recheck for medical concerns as needed.

## 2015-10-14 NOTE — Telephone Encounter (Signed)
Patient was seen by [facility] 11/2 and Rx was completed

## 2015-12-02 ENCOUNTER — Encounter: Payer: Self-pay | Admitting: Physical Medicine & Rehabilitation

## 2015-12-08 ENCOUNTER — Encounter
Payer: BLUE CROSS/BLUE SHIELD | Attending: Physical Medicine & Rehabilitation | Admitting: Physical Medicine & Rehabilitation

## 2015-12-08 ENCOUNTER — Encounter: Payer: Self-pay | Admitting: Physical Medicine & Rehabilitation

## 2015-12-08 ENCOUNTER — Other Ambulatory Visit: Payer: Self-pay | Admitting: Physical Medicine & Rehabilitation

## 2015-12-08 VITALS — BP 140/90 | HR 70 | Resp 14

## 2015-12-08 DIAGNOSIS — M79651 Pain in right thigh: Secondary | ICD-10-CM | POA: Insufficient documentation

## 2015-12-08 DIAGNOSIS — Z5181 Encounter for therapeutic drug level monitoring: Secondary | ICD-10-CM

## 2015-12-08 DIAGNOSIS — M79604 Pain in right leg: Secondary | ICD-10-CM | POA: Diagnosis present

## 2015-12-08 DIAGNOSIS — Z79899 Other long term (current) drug therapy: Secondary | ICD-10-CM | POA: Diagnosis not present

## 2015-12-08 DIAGNOSIS — Z87891 Personal history of nicotine dependence: Secondary | ICD-10-CM | POA: Insufficient documentation

## 2015-12-08 DIAGNOSIS — G894 Chronic pain syndrome: Secondary | ICD-10-CM | POA: Insufficient documentation

## 2015-12-08 MED ORDER — HYDROCODONE-ACETAMINOPHEN 5-325 MG PO TABS: 1.0000 | ORAL_TABLET | Freq: Every day | ORAL | Status: DC | PRN

## 2015-12-08 MED ORDER — TRAMADOL HCL 50 MG PO TABS: 100.0000 mg | ORAL_TABLET | Freq: Four times a day (QID) | ORAL | Status: DC | PRN

## 2015-12-08 NOTE — Addendum Note (Signed)
Addended by: Caro Hight on: 12/08/2015 11:36 AM   Modules accepted: Orders

## 2015-12-08 NOTE — Progress Notes (Addendum)
Subjective:    Patient ID: Evan [name], male    DOB: [birth year], 59 y.o.   MRN: [id number]  HPI 59 y/o male with pmh of OA presents with right leg pain.  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  The pain has been present for years and has been getting progressively worse.  He denies inciting event.  Exercise, pain meds improve the pain.  Prolonged postures exacerbate the pain.  Describes the pain as sharp.  Denies radiation.  Denies back pain.  Pain is constant.  Today, severity is 4/10.  If the pt is moving the pain does not bother him as much, if he sits, when he gets up, it takes him a couple of minutes for the pain to wear off.    He has tried prednisone with ?benefit.   Pain Inventory Average Pain na Pain Right Now na My pain is sharp, stabbing, tingling and aching  In the last 24 hours, has pain interfered with the following? General activity 8 Relation with others 7 Enjoyment of life 5 What TIME of day is your pain at its worst? daytime, night Sleep (in general) NA  Pain is worse with: walking, bending, sitting and some activites Pain improves with: rest, therapy/exercise and medication Relief from Meds: 10  Mobility walk without assistance ability to climb steps?  yes do you drive?  yes  Function employed # of hrs/week 50 hours what is your job? [employer] I need assistance with the following:  dressing  Neuro/Psych No problems in this area  Prior Studies Any changes since last visit?  no  Physicians involved in your care Primary care [place]  PMH: OA  Family History  Problem Relation Age of Onset  . Heart disease Mother   . Dementia Father    Social History   Social History  . Marital Status: Widowed    Spouse Name: N/A  . Number of Children: N/A  . Years of Education: N/A   Social History Main Topics  . Smoking status: Former Smoker    Quit date: 11/19/2005  . Smokeless tobacco: None  . Alcohol Use: No   . Drug Use: No  . Sexual Activity: Not Asked   Other Topics Concern  . None   Social History Narrative   Past Surgical History  Procedure Laterality Date  . Shoulder arthroscopy    . Wrist surgery Left    History reviewed. No pertinent past medical history. BP 140/90 mmHg  Pulse 70  Resp 14  SpO2 95%  Opioid Risk Score:   Fall Risk Score:  `1  Depression screen PHQ 2/9  Depression screen PHQ 2/9 12/08/2015  Decreased Interest 2  Down, Depressed, Hopeless 1  PHQ - 2 Score 3  Altered sleeping 2  Tired, decreased energy 2  Change in appetite 2  Feeling bad or failure about yourself  1  Trouble concentrating 2  Moving slowly or fidgety/restless 2  Suicidal thoughts 0  PHQ-9 Score 14  Difficult doing work/chores Very difficult   Current Outpatient Prescriptions on File Prior to Visit  Medication Sig Dispense Refill  . diclofenac (VOLTAREN) 75 MG EC tablet Take 1 tablet (75 mg total) by mouth 2 (two) times daily. 60 tablet 4  . HYDROcodone-acetaminophen (NORCO) 10-325 MG tablet Take 1 tablet by mouth every 8 (eight) hours as needed. 30 tablet 0  . predniSONE (DELTASONE) 20 MG tablet Take 1 tablet (20 mg total) by mouth daily  with breakfast. 18 tablet 0  . traMADol (ULTRAM) 50 MG tablet Take 1 tablet (50 mg total) by mouth 2 (two) times daily as needed. 30 tablet 0   No current facility-administered medications on file prior to visit.   Review of Systems  Constitutional: Negative for fever and fatigue.  Gastrointestinal: Negative for nausea and diarrhea.  Genitourinary: Negative for urgency.  Musculoskeletal:       +Right leg pain  Skin: Negative.   Neurological: Negative for weakness and numbness.  All other systems reviewed and are negative.     Objective:   Physical Exam HENT: Normocephalic, Atraumatic Eyes: EOMI, Conj WNL Cardio: S1, S2 normal, RRR Pulm: B/l clear to auscultation.  Effort normal Abd: Soft, non-distended, non-tender, BS+ MSK: Gait  Antalgic.   TTP right lateral leg.    No edema.   Difficult to assess FABERs due to lateral leg pain.  Neg fortin finger Neuro: CN II-XII grossly intact.    Sensation intact to light touch in all LE dermatomes  3+ right L4, otherwise 2+ throughout  Strength  5/5 in all LLE myotomes    4/5 in all RLE myotomes (limited due to pain)  ?SLR RLE, limited due to pain  +3 beat clonus in RLE, neg LLE Skin: Warm and Dry     Assessment & Plan:  59 y/o with right leg pain  1. Chronic pain syndrome  Pt states he took medication immediately before coming to the office, so is not in that much pain.  Will refer pt to PT as this has helped him in the past  Will also order MRI due to +neurological findings  Will order tramadol 100 q6 PRN  Will order hydocodone q8 PRN, discussed with pt this is not a long term medication he would be receiving, but provide until follow up visit  Opiod contract signed by pt  UDS performed  There may also be a component of trochanteric bursitis, but it is difficult to assess at this point  Will consider aquatic therapy referral in future   RTC 6 weeks

## 2015-12-09 LAB — PMP ALCOHOL METABOLITE (ETG): ETGU: NEGATIVE ng/mL

## 2015-12-10 LAB — OPIATES/OPIOIDS (LC/MS-MS)
Codeine Urine: NEGATIVE ng/mL (ref <50)
HYDROMORPHONE: NEGATIVE ng/mL — AB (ref <50)
Hydrocodone: 10970 ng/mL (ref <50)
MORPHINE: NEGATIVE ng/mL (ref <50)
NORHYDROCODONE, UR: 7749 ng/mL (ref <50)
NOROXYCODONE, UR: NEGATIVE ng/mL (ref <50)
OXYCODONE, UR: NEGATIVE ng/mL (ref <50)
OXYMORPHONE, URINE: NEGATIVE ng/mL (ref <50)

## 2015-12-11 LAB — PRESCRIPTION MONITORING PROFILE (SOLSTAS)
AMPHETAMINE/METH: NEGATIVE ng/mL
BARBITURATE SCREEN, URINE: NEGATIVE ng/mL
BENZODIAZEPINE SCREEN, URINE: NEGATIVE ng/mL
Buprenorphine, Urine: NEGATIVE ng/mL
Cannabinoid Scrn, Ur: NEGATIVE ng/mL
Carisoprodol, Urine: NEGATIVE ng/mL
Cocaine Metabolites: NEGATIVE ng/mL
Creatinine, Urine: 293.81 mg/dL (ref >20.0)
Fentanyl, Ur: NEGATIVE ng/mL
MDMA URINE: NEGATIVE ng/mL
MEPERIDINE UR: NEGATIVE ng/mL
METHADONE SCREEN, URINE: NEGATIVE ng/mL
NITRITES URINE, INITIAL: NEGATIVE ug/mL
OXYCODONE SCRN UR: NEGATIVE ng/mL
PROPOXYPHENE: NEGATIVE ng/mL
TAPENTADOLUR: NEGATIVE ng/mL
Tramadol Scrn, Ur: NEGATIVE ng/mL
Zolpidem, Urine: NEGATIVE ng/mL
pH, Initial: 5 pH (ref 4.5–8.9)

## 2015-12-15 ENCOUNTER — Ambulatory Visit: Payer: BLUE CROSS/BLUE SHIELD | Attending: Physical Medicine & Rehabilitation | Admitting: Physical Therapy

## 2015-12-15 DIAGNOSIS — M6281 Muscle weakness (generalized): Secondary | ICD-10-CM | POA: Insufficient documentation

## 2015-12-15 DIAGNOSIS — R6889 Other general symptoms and signs: Secondary | ICD-10-CM | POA: Insufficient documentation

## 2015-12-15 DIAGNOSIS — M79604 Pain in right leg: Secondary | ICD-10-CM | POA: Insufficient documentation

## 2015-12-15 DIAGNOSIS — M2560 Stiffness of unspecified joint, not elsewhere classified: Secondary | ICD-10-CM | POA: Insufficient documentation

## 2015-12-15 DIAGNOSIS — M5416 Radiculopathy, lumbar region: Secondary | ICD-10-CM | POA: Insufficient documentation

## 2015-12-15 DIAGNOSIS — M5441 Lumbago with sciatica, right side: Secondary | ICD-10-CM | POA: Insufficient documentation

## 2015-12-15 DIAGNOSIS — M79601 Pain in right arm: Secondary | ICD-10-CM | POA: Insufficient documentation

## 2015-12-15 DIAGNOSIS — R29898 Other symptoms and signs involving the musculoskeletal system: Secondary | ICD-10-CM | POA: Insufficient documentation

## 2016-01-10 ENCOUNTER — Ambulatory Visit: Payer: BLUE CROSS/BLUE SHIELD

## 2016-01-11 ENCOUNTER — Encounter: Payer: Self-pay | Admitting: Physical Therapy

## 2016-01-11 ENCOUNTER — Ambulatory Visit: Payer: BLUE CROSS/BLUE SHIELD | Admitting: Physical Therapy

## 2016-01-11 DIAGNOSIS — M5441 Lumbago with sciatica, right side: Secondary | ICD-10-CM | POA: Diagnosis present

## 2016-01-11 DIAGNOSIS — M5416 Radiculopathy, lumbar region: Secondary | ICD-10-CM | POA: Diagnosis not present

## 2016-01-11 DIAGNOSIS — M6281 Muscle weakness (generalized): Secondary | ICD-10-CM

## 2016-01-11 DIAGNOSIS — M79601 Pain in right arm: Secondary | ICD-10-CM | POA: Diagnosis present

## 2016-01-11 DIAGNOSIS — R29898 Other symptoms and signs involving the musculoskeletal system: Secondary | ICD-10-CM | POA: Diagnosis present

## 2016-01-11 DIAGNOSIS — M5386 Other specified dorsopathies, lumbar region: Secondary | ICD-10-CM

## 2016-01-11 DIAGNOSIS — M79604 Pain in right leg: Secondary | ICD-10-CM | POA: Diagnosis present

## 2016-01-11 DIAGNOSIS — M25669 Stiffness of unspecified knee, not elsewhere classified: Secondary | ICD-10-CM

## 2016-01-11 DIAGNOSIS — R6889 Other general symptoms and signs: Secondary | ICD-10-CM | POA: Diagnosis present

## 2016-01-11 DIAGNOSIS — M2560 Stiffness of unspecified joint, not elsewhere classified: Secondary | ICD-10-CM | POA: Diagnosis present

## 2016-01-11 NOTE — Patient Instructions (Addendum)
Posture Tips DO: - stand tall and erect - keep chin tucked in - keep head and shoulders in alignment - check posture regularly in mirror or large window - pull head back against headrest in car seat;  Change your position often.  Sit with lumbar support. DON'T: - slouch or slump while watching TV or reading - sit, stand or lie in one position  for too long;  Sitting is especially hard on the spine so if you sit at a desk/use the computer, then stand up often!   Copyright  VHI. All rights reserved.  Posture - Standing   Good posture is important. Avoid slouching and forward head thrust. Maintain curve in low back and align ears over shoul- ders, hips over ankles.  Pull your belly button in toward your back bone. Stand with even weight on balls of feet and heels,  Stand with ribs lifted up and chin down   Copyright  VHI. All rights reserved.  Posture - Sitting   Sit upright, head facing forward. Try using a roll to support lower back. Keep shoulders relaxed, and avoid rounded back. Keep hips level with knees. Avoid crossing legs for long periods. Sit on sit bones and not tail bone.  Sit with lumbar support all the way back into chair   Copyright  VHI. All rights reserved.  Leg Extension (Hamstring)   Sit toward front edge of chair, with leg out straight, heel on floor, toes pointing toward body. Keeping back straight, bend forward at hip, breathing out through pursed lips. Return, breathing in. Repeat __3_ times. Repeat with other leg. Do __3_ sessions per day  Can do the one below Variation: Perform from standing position, with support.  Hamstring Step 1   Straighten left knee. Keep knee level with other knee . Hold _30__ seconds. Relax knee by returning foot to start. Repeat _3__ times. 3 times a day  Hamstring Stretch   Hamstring Stretch (Standing)   Standing, place one heel on chair or bench. Use one or both hands on thigh for support. Keeping torso straight, lean forward  slowly until a stretch is felt in back of same thigh. Hold _30___ seconds. Repeat with other leg.    Voncille Lo, PT 01/11/2016 11:43 AM Phone: [phone] Fax: [phone]

## 2016-01-11 NOTE — Therapy (Signed)
[place], [place], [address] Phone: [phone]   Fax:  [phone]  Physical Therapy Evaluation  Patient Details  Name: Evan [name] MRN: [id number] Date of Birth: 1956-06-03 Referring Provider: Delice Lesch MD  Encounter Date: 01/11/2016      PT End of Session - 01/11/16 1256    Visit Number 1   Number of Visits 8   Date for PT Re-Evaluation 03/07/16   Authorization Type [employer]   Authorization Time Period 3_28-17   PT Start Time 1100   PT Stop Time 1150   PT Time Calculation (min) 50 min   Activity Tolerance Patient tolerated treatment well   Behavior During Therapy [facility] for tasks assessed/performed      History reviewed. No pertinent past medical history.  Past Surgical History  Procedure Laterality Date  . Shoulder arthroscopy    . Wrist surgery Left     There were no vitals filed for this visit.  Visit Diagnosis:  Lumbar radiculopathy, chronic  Decreased ROM of lumbar spine  Decreased ROM of lower extremity  Musculoskeletal pain of lower extremity, right  Activity intolerance  Decreased muscle strength  Right-sided low back pain with right-sided sciatica      Subjective Assessment - 01/11/16 1110    Subjective Sometimes I have pain down my right leg and it catches me off gaurd with shooting pain.  I can walk for 1 mile but then i hurt bad.  Ihave had this pain for 2 years.   Pertinent History no surgeries   Limitations Standing;Walking;House hold activities   How long can you sit comfortably? unlimited   How long can you stand comfortably? 1 hour   How long can you walk comfortably? 1 hour   Diagnostic tests MRI   Patient Stated Goals I would like to return to exercise . 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  I would like to exericises without back pain   Currently in Pain? 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 and getting in and out of car, going up and down steps   Pain Relieving Factors medication   Multiple Pain Sites Yes   Pain Score 8   Pain Location Leg  anterior thigh pain   Pain Orientation Right   Pain Descriptors / Indicators Aching;Sharp   Pain Type Chronic pain   Pain Onset More than a month ago   Pain Frequency Intermittent   Aggravating Factors  same as above            [facility] PT Assessment - 01/11/16 1115    Assessment   Medical Diagnosis low back pain radiating into right leg and thigh   Referring Provider Delice Lesch MD   Onset Date/Surgical Date 01/10/14   Hand Dominance Right   Prior Therapy none   Precautions   Precautions None   Restrictions   Weight Bearing Restrictions No   Balance Screen   Has the patient fallen in the past 6 months No   Has the patient had a decrease in activity level because of a fear of falling?  No   Is the patient reluctant to leave their home because of  a fear of falling?  No   Home Social worker Private residence   Living Arrangements Spouse/significant other;Children   [place] to enter   Entrance Stairs-Number of Steps 1   [place] One level   Prior Function   Level of Independence Independent   Cognition   Overall Cognitive Status Within Functional Limits for tasks assessed   Observation/Other Assessments   Focus on Therapeutic Outcomes (FOTO)  Intake 49% limtation 51%  predicted 40%   Functional Tests   Functional tests Squat;Lunges   Squat   Comments Pt can only bend 70 degrees with wt shift to left off of Right.   Lunges   Comments Left side able to bend 60 degrees, right unable to lunge with right and demo poor motor control and visibel co contractions   Posture/Postural Control   Posture/Postural Control  Postural limitations   AROM   Right Hip Flexion 90  pain   Right Hip External Rotation  15  pain   Right Hip Internal Rotation  15  pain   Right Hip ABduction 45   Left Hip Flexion 105   Left Hip External Rotation  25   Left Hip Internal Rotation  30   Lumbar Flexion 64   Lumbar Extension 10   Lumbar - Right Side Bend 20   Lumbar - Left Side Bend 13  pain   Lumbar - Right Rotation 80%   Lumbar - Left Rotation 60%   Strength   Overall Strength Deficits   Flexibility   Hamstrings Pt with bil hamstring tightness R58, L 63   Palpation   Spinal mobility hypomobility T8 to L5,  Pt with increased pain with Right ext and rotation with referral into ant and lateral thigh   Palpation comment Pt with hypomobility of Lumbar L-1 to L5 spinous process and pain , Tenderness over Right Quadratus and Right gluts.          Posture education for sitting and standing There Ex. Hamstring stretch with strap in supine 30 sec x 3 and sitting 30 sec x 3 Standing hamstring/ITband stretch with foot on chair and rotating ankle for added stretch to tolerance for 2 minutes                  PT Education - 01/11/16 1143    Education provided Yes   Education Details POC, Explanation of findings, posture sitting and standing.  Hamstring stretch/ IT band variations variations   Person(s) Educated Patient   Methods Explanation;Demonstration;Tactile cues;Verbal cues;Handout   Comprehension Verbalized understanding;Returned demonstration          PT Short Term Goals - 01/11/16 1303    PT SHORT TERM GOAL #1   Title Pt will be independent with Initial HEP2-28-17   Time 4   Period Weeks   Status New   PT SHORT TERM GOAL #2   Title "Report pain decrease from8 /10 to5 /10.    02-08-16   Time 4   Period Weeks   Status New   PT SHORT TERM GOAL #3   Title "Demonstrate understanding of proper sitting posture and be more conscious of position and posture throughout the day.    Time 4    Period Weeks   Status New           PT Long Term Goals - 01/11/16 1305    PT LONG TERM GOAL #1   Title "Demonstrate and verbalize techniques to reduce the risk of  re-injury including: lifting, posture, body mechanics. 03-07-16   Time 8   Period Weeks   Status New   PT LONG TERM GOAL #2   Title "Pt will be independent with advanced HEP 03-07-16   Time 8   Period Weeks   Status New   PT LONG TERM GOAL #3   Title "Pain will decrease to 3/10 or less with all functional activities   Time 8   Period Weeks   Status New   PT LONG TERM GOAL #4   Title "Pt will tolerate sitting 1 hour without increased pain to ride in car without increased pain   Time 8   Period Weeks   Status New   PT LONG TERM GOAL #5   Title Pt will be able to get in and out of car without exacerbating pain symptoms in back   Time 8   Period Weeks   Status New   PT LONG TERM GOAL #6   Title "FOTO will improve from   51%   to  40% limtation   indicating improved functional mobility   Time 8   Period Weeks   Status New               Plan - 01/11/16 1257    Clinical Impression Statement 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  Pt has complained of pain for last 2 years but recently increasing in pain. Pt presents with signs and symptoms compatible with lumbar radiculopathy due to disc derangement and stiffness dominant.   Pt must travels from [place] and he would like an HEP to address his deficits and help him get stronger in order to do his work as a Curator and to return to healthy exericise which he has not done for > than 5 years. Pt would benefit from skilled PT for 1 times a week for  8 weeks (limited in transportation and distance from clinic)to address above impariments and functional limitations and return to pain-free PLOF.   Pt will benefit from skilled therapeutic  intervention in order to improve on the following deficits Decreased activity tolerance;Decreased mobility;Decreased strength;Hypomobility;Decreased range of motion;Postural dysfunction;Improper body mechanics;Pain;Increased fascial restricitons   Rehab Potential Good   PT Frequency 1x / week   PT Duration 8 weeks   PT Treatment/Interventions Dry needling;Iontophoresis '4mg'$ /ml Dexamethasone;Moist Heat;Electrical Stimulation;ADLs/Self Care Home Management;Cryotherapy;Ultrasound;Traction;Functional mobility training;Therapeutic exercise;Manual techniques;Taping;Patient/family education;Neuromuscular re-education   PT Next Visit Plan Give back back and introduce Lynden [place] extension and assess benefit.  Manual/dry needling  as needed   PT Home Exercise Plan Posture and hamstring stretch   Consulted and Agree with Plan of Care Patient         Problem List Patient Active Problem List   Diagnosis Date Noted  . Right leg pain 12/08/2015  . Right thigh pain 12/08/2015  . Osteoarthrosis, unspecified whether generalized or localized, involving lower leg 08/17/2015  . Chronic back pain 08/17/2015   Voncille Lo, PT 01/11/2016 1:13 PM Phone: [phone] Fax: [phone]  By signing I understand that I am ordering/authorizing the use of Iontophoresis using 4 mg/mL of dexamethasone as a component of this plan of care. [place], [place], [address] Phone: [phone]   Fax:  [phone]  Name: Evan [name] MRN: [id number] Date of Birth: [birth year]

## 2016-01-18 ENCOUNTER — Ambulatory Visit: Payer: BLUE CROSS/BLUE SHIELD | Attending: Physical Medicine & Rehabilitation | Admitting: Physical Therapy

## 2016-01-18 DIAGNOSIS — R29898 Other symptoms and signs involving the musculoskeletal system: Secondary | ICD-10-CM | POA: Diagnosis present

## 2016-01-18 DIAGNOSIS — M2560 Stiffness of unspecified joint, not elsewhere classified: Secondary | ICD-10-CM | POA: Insufficient documentation

## 2016-01-18 DIAGNOSIS — M5441 Lumbago with sciatica, right side: Secondary | ICD-10-CM | POA: Diagnosis present

## 2016-01-18 DIAGNOSIS — M79604 Pain in right leg: Secondary | ICD-10-CM | POA: Diagnosis present

## 2016-01-18 DIAGNOSIS — R6889 Other general symptoms and signs: Secondary | ICD-10-CM | POA: Diagnosis present

## 2016-01-18 DIAGNOSIS — M6281 Muscle weakness (generalized): Secondary | ICD-10-CM | POA: Insufficient documentation

## 2016-01-18 DIAGNOSIS — M25669 Stiffness of unspecified knee, not elsewhere classified: Secondary | ICD-10-CM

## 2016-01-18 DIAGNOSIS — M79601 Pain in right arm: Secondary | ICD-10-CM | POA: Diagnosis present

## 2016-01-18 DIAGNOSIS — M5416 Radiculopathy, lumbar region: Secondary | ICD-10-CM | POA: Diagnosis not present

## 2016-01-18 DIAGNOSIS — M5386 Other specified dorsopathies, lumbar region: Secondary | ICD-10-CM

## 2016-01-18 NOTE — Therapy (Signed)
[place], [place], [address] Phone: [phone]   Fax:  [phone]  Physical Therapy Treatment  Patient Details  Name: Evan [name] MRN: [id number] Date of Birth: Jun 29, 1956 Referring Provider: Delice Lesch MD  Encounter Date: 01/18/2016      PT End of Session - 01/18/16 1009    Visit Number 2   Number of Visits 8   Date for PT Re-Evaluation 03/07/16   Authorization Type [employer]   Authorization Time Period 3_28-17   PT Start Time 0932   PT Stop Time 1025   PT Time Calculation (min) 53 min   Activity Tolerance Patient tolerated treatment well   Behavior During Therapy [facility] for tasks assessed/performed      No past medical history on file.  Past Surgical History  Procedure Laterality Date  . Shoulder arthroscopy    . Wrist surgery Left     There were no vitals filed for this visit.  Visit Diagnosis:  Lumbar radiculopathy, chronic  Decreased ROM of lumbar spine  Decreased ROM of lower extremity  Musculoskeletal pain of lower extremity, right  Activity intolerance  Decreased muscle strength  Right-sided low back pain with right-sided sciatica      Subjective Assessment - 01/18/16 0936    Subjective I feel the same as on the initial evaluation   Pertinent History no surgeries   Limitations Standing;Walking;House hold activities   How long can you sit comfortably? unlimited   How long can you stand comfortably? 1 hour   How long can you walk comfortably? 1 hour   Diagnostic tests MRI   Currently in Pain? Yes   Pain Score 5    Pain Location Back   Pain Orientation Right   Pain Descriptors / Indicators Aching   Pain Score 8   Pain Location Leg  thigh/ piriformis   Pain Orientation Right   Pain Descriptors / Indicators Aching;Sharp   Pain Type Chronic pain   Pain Onset More than a month ago   Pain Frequency Intermittent                         [facility] Adult  PT Treatment/Exercise - 01/18/16 1012    Knee/Hip Exercises: Stretches   Piriformis Stretch 5 reps;30 seconds  some to 60 sec on right sitting and supine   Piriformis Stretch Limitations pt with limited IR/ER   Other Knee/Hip Stretches trunk rotation stretch in supine bil 3 x 30 sec each   Modalities   Modalities Moist Heat   Moist Heat Therapy   Number Minutes Moist Heat 15 Minutes   Moist Heat Location Lumbar Spine;Hip   Manual Therapy   Joint Mobilization right PA hip mob grade 3/4 and IR/er maitland in supine and prone grae 3 and 4   Soft tissue mobilization prone IR/ER to right gluteals and piriformis          Trigger Point Dry Needling - 01/18/16 1013    Consent Given? Yes   Education Handout Provided Yes   Muscles Treated Lower Body Gluteus minimus;Gluteus maximus;Piriformis  right side only   Gluteus Maximus Response Twitch response elicited;Palpable increased muscle length   Gluteus Minimus Response Twitch response elicited;Palpable increased muscle length   Piriformis Response Twitch response elicited;Palpable increased muscle length      trans abdominal set 10 sec with cough to engage  X 10          PT Education - 01/18/16  0943    Education provided Yes   Education Details triigger point dry needling, piriformis  and trunk rotation stretch   Person(s) Educated Patient   Methods Explanation;Demonstration;Verbal cues;Handout   Comprehension Verbalized understanding;Returned demonstration          PT Short Term Goals - 01/18/16 0934    PT SHORT TERM GOAL #1   Title Pt will be independent with Initial HEP2-28-17   Time 4   Period Weeks   Status On-going   PT SHORT TERM GOAL #2   Title "Report pain decrease from8 /10 to5 /10.    02-08-16   Time 4   Period Weeks   Status On-going   PT SHORT TERM GOAL #3   Title "Demonstrate understanding of proper sitting posture and be more conscious of position and posture throughout the day.    Time 4   Period Weeks    Status On-going           PT Long Term Goals - 01/18/16 0935    PT LONG TERM GOAL #1   Title "Demonstrate and verbalize techniques to reduce the risk of re-injury including: lifting, posture, body mechanics. 03-07-16   Time 8   Period Weeks   Status On-going   PT LONG TERM GOAL #2   Title "Pt will be independent with advanced HEP 03-07-16   Time 8   Period Weeks   Status On-going   PT LONG TERM GOAL #3   Title "Pain will decrease to 3/10 or less with all functional activities   Period Weeks   Status On-going   PT LONG TERM GOAL #4   Title "Pt will tolerate sitting 1 hour without increased pain to ride in car without increased pain   Time 8   Period Weeks   Status On-going   PT LONG TERM GOAL #5   Title Pt will be able to get in and out of car without exacerbating pain symptoms in back   Time 8   Period Weeks   Status On-going   PT LONG TERM GOAL #6   Title "FOTO will improve from   51%   to  40% limtation   indicating improved functional mobility   Time 8   Period Weeks   Status On-going               Plan - 01/18/16 1009    Clinical Impression Statement Pt had trigger points in right gluteals and right piriformis that decreased from 8/10 to  4- 5/10 with one treatment.  Pt with limited ER/IR hip AROM and PROM but increased post treatment.  Pt given HEP for piriformis and trunk rotation and needs to continue with core ABDominal and basic back   Pt will benefit from skilled therapeutic intervention in order to improve on the following deficits Decreased activity tolerance;Decreased mobility;Decreased strength;Hypomobility;Decreased range of motion;Postural dysfunction;Improper body mechanics;Pain;Increased fascial restricitons   Rehab Potential Good   PT Frequency 1x / week   PT Duration 8 weeks   PT Treatment/Interventions Dry needling;Iontophoresis '4mg'$ /ml Dexamethasone;Moist Heat;Electrical Stimulation;ADLs/Self Care Home  Management;Cryotherapy;Ultrasound;Traction;Functional mobility training;Therapeutic exercise;Manual techniques;Taping;Patient/family education;Neuromuscular re-education   PT Next Visit Plan Give back back review HEP given for piriformis and trunk rogtation and assess benefit of dry needling.   continue with core abdominal and back strength.    PT Home Exercise Plan hamstring, Transabd set and piriformis and trunk roatation   Consulted and Agree with Plan of Care Patient        Problem  List Patient Active Problem List   Diagnosis Date Noted  . Right leg pain 12/08/2015  . Right thigh pain 12/08/2015  . Osteoarthrosis, unspecified whether generalized or localized, involving lower leg 08/17/2015  . Chronic back pain 08/17/2015   Voncille Lo, PT 01/18/2016 10:17 AM Phone: [phone] Fax: [facility] [address] [place], [place], [address] Phone: [phone]   Fax:  [phone]  Name: ALYAN HARTLINE MRN: [id number] Date of Birth: [birth year]

## 2016-01-18 NOTE — Patient Instructions (Addendum)
Trigger Point Dry Needling  . What is Trigger Point Dry Needling (DN)? o DN is a physical therapy technique used to treat muscle pain and dysfunction. Specifically, DN helps deactivate muscle trigger points (muscle knots).  o A thin filiform needle is used to penetrate the skin and stimulate the underlying trigger point. The goal is for a local twitch response (LTR) to occur and for the trigger point to relax. No medication of any kind is injected during the procedure.   . What Does Trigger Point Dry Needling Feel Like?  o The procedure feels different for each individual patient. Some patients report that they do not actually feel the needle enter the skin and overall the process is not painful. Very mild bleeding may occur. However, many patients feel a deep cramping in the muscle in which the needle was inserted. This is the local twitch response.   Marland Kitchen How Will I feel after the treatment? o Soreness is normal, and the onset of soreness may not occur for a few hours. Typically this soreness does not last longer than two days.  o Bruising is uncommon, however; ice can be used to decrease any possible bruising.  o In rare cases feeling tired or nauseous after the treatment is normal. In addition, your symptoms may get worse before they get better, this period will typically not last longer than 24 hours.   . What Can I do After My Treatment? o Increase your hydration by drinking more water for the next 24 hours. o You may place ice or heat on the areas treated that have become sore, however, do not use heat on inflamed or bruised areas. Heat often brings more relief post needling. o You can continue your regular activities, but vigorous activity is not recommended initially after the treatment for 24 hours. o DN is best combined with other physical therapy such as strengthening, stretching, and other therapies.     Hip Stretch  Put right ankle over left knee. Let right knee fall downward, but  keep ankle in place. Feel the stretch in hip. May push down gently with hand to feel stretch. Hold 30____ seconds while counting out loud. Repeat with other leg. Repeat __3__ times. Do __2-3__ sessions per day.  Stretching: Piriformis   Stretching: Piriformis (Supine)  Pull right knee toward opposite shoulder. Hold ___30-60_ seconds. Relax. Repeat _3___ times per set. Do __1__ sets per session. Do __2-3__ sessions per day.  Knee Roll    Lying on back, with knees bent and feet flat on floor, arms outstretched to sides, slowly roll both knees to side, hold  30 -60 seconds. . Then to opposite side, hold 5 30 to 60 seconds. Return to starting position. Keep shoulders and arms in contact with floor.  You can separate feet and do trunk rotation for added stretch to internal and exernal rotators of hip.   To start being aware of Transversus Abdominis , cough and hold muscle for 10 seconds.    Copyright  VHI. All rights reserved.   Voncille Lo, PT 01/18/2016 9:42 AM Phone: [phone] Fax: [phone]  .

## 2016-01-19 ENCOUNTER — Encounter: Payer: BLUE CROSS/BLUE SHIELD | Admitting: Physical Medicine & Rehabilitation

## 2016-01-20 ENCOUNTER — Encounter: Payer: Self-pay | Admitting: Physical Medicine & Rehabilitation

## 2016-01-20 ENCOUNTER — Encounter
Payer: BLUE CROSS/BLUE SHIELD | Attending: Physical Medicine & Rehabilitation | Admitting: Physical Medicine & Rehabilitation

## 2016-01-20 VITALS — BP 173/112 | HR 75 | Resp 14

## 2016-01-20 DIAGNOSIS — M791 Myalgia: Secondary | ICD-10-CM

## 2016-01-20 DIAGNOSIS — G894 Chronic pain syndrome: Secondary | ICD-10-CM | POA: Insufficient documentation

## 2016-01-20 DIAGNOSIS — M79604 Pain in right leg: Secondary | ICD-10-CM | POA: Insufficient documentation

## 2016-01-20 DIAGNOSIS — Z79899 Other long term (current) drug therapy: Secondary | ICD-10-CM | POA: Insufficient documentation

## 2016-01-20 DIAGNOSIS — Z87891 Personal history of nicotine dependence: Secondary | ICD-10-CM | POA: Diagnosis not present

## 2016-01-20 DIAGNOSIS — M79651 Pain in right thigh: Secondary | ICD-10-CM

## 2016-01-20 DIAGNOSIS — M609 Myositis, unspecified: Secondary | ICD-10-CM

## 2016-01-20 DIAGNOSIS — Z5181 Encounter for therapeutic drug level monitoring: Secondary | ICD-10-CM | POA: Diagnosis not present

## 2016-01-20 DIAGNOSIS — IMO0001 Reserved for inherently not codable concepts without codable children: Secondary | ICD-10-CM

## 2016-01-20 MED ORDER — HYDROCODONE-ACETAMINOPHEN 5-325 MG PO TABS: 1.0000 | ORAL_TABLET | Freq: Every day | ORAL | Status: DC | PRN

## 2016-01-20 NOTE — Progress Notes (Signed)
Urine drug screen for this encounter is consistent for prescribed medication 

## 2016-01-20 NOTE — Progress Notes (Signed)
Subjective:    Patient ID: Evan [name], male    DOB: 09-03-1956, 60 y.o.   MRN: [id number]  HPI   60 y/o male with pmh of OA presents with right leg pain. Pt is a Curator. Located along the later aspect of his proximal right leg. The pain has been present for years and had been getting progressively worse. He denied inciting event. Exercise, pain meds improve the pain. Prolonged postures exacerbate the pain. Describes the pain as sharp. Denies radiation. Denies back pain. Pain is constant. Today, severity is 5/10. If the pt is moving the pain does not bother him as much, if he sits, when he gets up, it takes him a couple of minutes for the pain to wear off.  On his last visit he was referred to PT.  He did not have an MRI.  Tramadol 100 was ordered along with hydrocodone.   He has been going to PT, he has only had 3 sessions, but notes that he has had considerable improvement.  He does not like the way tramadol makes him feel, so does not typically take it. He has been taking the hydrocodone 1/day.  He has been taking aleve, which appears to help.    Pain Inventory Average Pain 6 Pain Right Now 5 My pain is sharp, stabbing and aching  In the last 24 hours, has pain interfered with the following? General activity 7 Relation with others 0 Enjoyment of life 3 What TIME of day is your pain at its worst? daytime Sleep (in general) Poor  Pain is worse with: walking, bending, standing and some activites Pain improves with: rest Relief from Meds: NA  Mobility walk without assistance ability to climb steps?  yes do you drive?  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  no  Physicians involved in your care Any changes since last visit?  no   Family History  Problem Relation Age of Onset  . Heart disease Mother   . Dementia Father     Social History   Social History  . Marital Status: Widowed    Spouse Name: N/A  . Number of Children: N/A  . Years of Education: N/A   Social History Main Topics  . Smoking status: Former Smoker    Quit date: 11/19/2005  . Smokeless tobacco: None  . Alcohol Use: No  . Drug Use: No  . Sexual Activity: Not Asked   Other Topics Concern  . None   Social History Narrative   Past Surgical History  Procedure Laterality Date  . Shoulder arthroscopy    . Wrist surgery Left    History reviewed. No pertinent past medical history. BP 173/112 mmHg  Pulse 75  Resp 14  SpO2 98%  Opioid Risk Score:   Fall Risk Score:  `1  Depression screen PHQ 2/9  Depression screen [facility] 2/9 01/20/2016 12/08/2015  Decreased Interest 2 2  Down, Depressed, Hopeless 1 1  PHQ - 2 Score 3 3  Altered sleeping 3 2  Tired, decreased energy 3 2  Change in appetite 3 2  Feeling bad or failure about yourself  0 1  Trouble concentrating 0 2  Moving slowly or fidgety/restless 0 2  Suicidal thoughts 0 0  PHQ-9 Score 12 14  Difficult doing work/chores Very difficult Very difficult    Review of Systems  All  other systems reviewed and are negative.     Objective:   Physical Exam HENT: Normocephalic, Atraumatic Eyes: EOMI, Conj WNL Cardio: S1, S2 normal, RRR Pulm: B/l clear to auscultation. Effort normal Abd: Soft, non-distended, non-tender, BS+ MSK: Gait Antalgic.  TTP right lateral leg.  No edema.   Limited ROM right hip 2/2 pain and tightness  Neg FABERs b/l, FADERs Neg fortin finger Neuro: CN II-XII grossly intact.  Sensation intact to light touch in all LE dermatomes  Reflexes 1+ right L4, otherwise 2+ throughout Strength 5/5 in all LLE myotomes 5/5 in all RLE myotomes  Neg SLR RLE +2 beat clonus in RLE, neg LLE Skin: Warm and  Dry    Assessment & Plan:  60 y/o with right leg pain  1. Chronic pain syndrome: right leg and lateral thigh Cont PT as this is providing him with significant relief  Change tramadol to '50mg'$  q6 PRN  Cont hydocodone daily PRN, discussed with pt this will be the last prescription to allow him to get through therapies.  UDS reviewed, expected Pt does not have access to a pool  Will consider [employer] if pt does not continue to improve  Will also consider MRI in future if symptoms persist and radiate  2. Myalgia  See above

## 2016-01-24 ENCOUNTER — Ambulatory Visit: Payer: BLUE CROSS/BLUE SHIELD | Admitting: Physical Therapy

## 2016-01-24 DIAGNOSIS — M25669 Stiffness of unspecified knee, not elsewhere classified: Secondary | ICD-10-CM

## 2016-01-24 DIAGNOSIS — M6281 Muscle weakness (generalized): Secondary | ICD-10-CM

## 2016-01-24 DIAGNOSIS — R6889 Other general symptoms and signs: Secondary | ICD-10-CM

## 2016-01-24 DIAGNOSIS — M5441 Lumbago with sciatica, right side: Secondary | ICD-10-CM

## 2016-01-24 DIAGNOSIS — M5386 Other specified dorsopathies, lumbar region: Secondary | ICD-10-CM

## 2016-01-24 DIAGNOSIS — M5416 Radiculopathy, lumbar region: Secondary | ICD-10-CM | POA: Diagnosis not present

## 2016-01-24 DIAGNOSIS — M79604 Pain in right leg: Secondary | ICD-10-CM

## 2016-01-24 NOTE — Therapy (Addendum)
[place], [place], [address] Phone: [phone]   Fax:  [phone]  Physical Therapy Treatment/Discharge Note  Patient Details  Name: Evan [name] MRN: [id number] Date of Birth: 10-06-1956 Referring Provider: Delice Lesch MD  Encounter Date: 01/24/2016      PT End of Session - 01/24/16 1016    Visit Number 3   Number of Visits 8   Date for PT Re-Evaluation 03/07/16   PT Start Time 0930   PT Stop Time 1010   PT Time Calculation (min) 40 min   Activity Tolerance Patient tolerated treatment well;No increased pain   Behavior During Therapy [facility] for tasks assessed/performed      No past medical history on file.  Past Surgical History  Procedure Laterality Date  . Shoulder arthroscopy    . Wrist surgery Left     There were no vitals filed for this visit.  Visit Diagnosis:  Lumbar radiculopathy, chronic  Decreased ROM of lumbar spine  Decreased ROM of lower extremity  Musculoskeletal pain of lower extremity, right  Activity intolerance  Decreased muscle strength  Right-sided low back pain with right-sided sciatica      Subjective Assessment - 01/24/16 0932    Subjective Less pain over all.  Stretching helpful.  Pain now intermittant.  Less medication   Currently in Pain? Yes   Pain Location Hip   Pain Orientation Right   Pain Descriptors / Indicators Aching;Sharp   Pain Radiating Towards back/gluteal/knee   Pain Frequency Intermittent   Aggravating Factors  not stretching   Pain Relieving Factors stretching,                         [facility] Adult PT Treatment/Exercise - 01/24/16 0939    Lumbar Exercises: Stretches   Passive Hamstring Stretch 3 reps;30 seconds   Single Knee to Chest Stretch 5 reps   Lower Trunk Rotation 30 seconds;3 reps  both, tight.   Pelvic Tilt 5 reps   Piriformis Stretch 3 reps;30 seconds   Piriformis Stretch Limitations 3 reps LT  passive by PTA   Lumbar Exercises: Supine   Bridge 10 reps  good height   Knee/Hip Exercises: Stretches   Gastroc Stretch 3 reps;30 seconds   Other Knee/Hip Stretches IT bands supine, standing, groin stretch supine knees flexed 3 X 30 seconds each                PT Education - 01/24/16 1016    Education provided Yes   Education Details stretching, ADL   Person(s) Educated Patient   Methods Explanation;Demonstration;Tactile cues;Verbal cues;Handout   Comprehension Verbalized understanding;Returned demonstration          PT Short Term Goals - 01/24/16 1017    PT SHORT TERM GOAL #1   Title Pt will be independent with Initial HEP2-28-17   Time 4   Period Weeks   Status On-going   PT SHORT TERM GOAL #2   Title "Report pain decrease from8 /10 to5 /10.    02-08-16   Baseline 3/10   Time 4   Period Weeks   Status Partially Met   PT SHORT TERM GOAL #3   Title "Demonstrate understanding of proper sitting posture and be more conscious of position and posture throughout the day.    Time 4   Period Weeks   Status On-going           PT Long Term Goals - 01/18/16 3300  PT LONG TERM GOAL #1   Title "Demonstrate and verbalize techniques to reduce the risk of re-injury including: lifting, posture, body mechanics. 03-07-16   Time 8   Period Weeks   Status On-going   PT LONG TERM GOAL #2   Title "Pt will be independent with advanced HEP 03-07-16   Time 8   Period Weeks   Status On-going   PT LONG TERM GOAL #3   Title "Pain will decrease to 3/10 or less with all functional activities   Period Weeks   Status On-going   PT LONG TERM GOAL #4   Title "Pt will tolerate sitting 1 hour without increased pain to ride in car without increased pain   Time 8   Period Weeks   Status On-going   PT LONG TERM GOAL #5   Title Pt will be able to get in and out of car without exacerbating pain symptoms in back   Time 8   Period Weeks   Status On-going   PT LONG TERM GOAL #6    Title "FOTO will improve from   51%   to  40% limtation   indicating improved functional mobility   Time 8   Period Weeks   Status On-going               Plan - 01/24/16 1016    Clinical Impression Statement Less pain post session.  Progress toward home exerecise goals and pain goals.   PT Next Visit Plan review new stretches, add quad stretch,  Review ADL   PT Home Exercise Plan groin, IT, hamstring, calf stretches   Consulted and Agree with Plan of Care Patient        Problem List Patient Active Problem List   Diagnosis Date Noted  . Right leg pain 12/08/2015  . Right thigh pain 12/08/2015  . Osteoarthrosis, unspecified whether generalized or localized, involving lower leg 08/17/2015  . Chronic back pain 08/17/2015    [facility] 01/24/2016, 10:19 AM  [facility] [address] [place], [place], [address] Phone: [phone]   Fax:  [phone]  Name: Evan [name] MRN: [id number] Date of Birth: May 22, 1956 Melvenia Needles, PTA 01/24/2016 10:19 AM Phone: [phone] Fax: [phone]  PHYSICAL THERAPY DISCHARGE SUMMARY  Visits from Start of Care: 3  Current functional level related to goals / functional outcomes: unknown   Remaining deficits: unknown   Education / Equipment: Initial HEP Plan:                                                    Patient goals were not met. Patient is being discharged due to not returning since the last visit.  ?????      Voncille Lo, PT [facility]  11/23/16 4:46 PM Phone: [phone] Fax: [phone]

## 2016-01-24 NOTE — Patient Instructions (Addendum)
Hamstring Step 1    Straighten left knee. Keep knee level with other knee or on bolster. Hold _30__ seconds. Relax knee by returning foot to start. Repeat __3_ times.  Copyright  VHI. All rights reserved.  Sleeping on Back  Place pillow under knees. A pillow with cervical support and a roll around waist are also helpful. Copyright  VHI. All rights reserved.  Sleeping on Side Place pillow between knees. Use cervical support under neck and a roll around waist as needed. Copyright  VHI. All rights reserved.   Sleeping on Stomach   If this is the only desirable sleeping position, place pillow under lower legs, and under stomach or chest as needed.  Posture - Sitting   Sit upright, head facing forward. Try using a roll to support lower back. Keep shoulders relaxed, and avoid rounded back. Keep hips level with knees. Avoid crossing legs for long periods. Stand to Sit / Sit to Stand   To sit: Bend knees to lower self onto front edge of chair, then scoot back on seat. To stand: Reverse sequence by placing one foot forward, and scoot to front of seat. Use rocking motion to stand up.   Work Height and Reach  Ideal work height is no more than 2 to 4 inches below elbow level when standing, and at elbow level when sitting. Reaching should be limited to arm's length, with elbows slightly bent.  Bending  Bend at hips and knees, not back. Keep feet shoulder-width apart.    Posture - Standing   Good posture is important. Avoid slouching and forward head thrust. Maintain curve in low back and align ears over shoul- ders, hips over ankles.  Alternating Positions   Alternate tasks and change positions frequently to reduce fatigue and muscle tension. Take rest breaks. Computer Work   Position work to Programmer, multimedia. Use proper work and seat height. Keep shoulders back and down, wrists straight, and elbows at right angles. Use chair that provides full back support. Add footrest and  lumbar roll as needed.  Getting Into / Out of Car  Lower self onto seat, scoot back, then bring in one leg at a time. Reverse sequence to get out.  Dressing  Lie on back to pull socks or slacks over feet, or sit and bend leg while keeping back straight.    Housework - Sink  Place one foot on ledge of cabinet under sink when standing at sink for prolonged periods.   Pushing / Pulling  Pushing is preferable to pulling. Keep back in proper alignment, and use leg muscles to do the work.  Deep Squat   Squat and lift with both arms held against upper trunk. Tighten stomach muscles without holding breath. Use smooth movements to avoid jerking.  Avoid Twisting   Avoid twisting or bending back. Pivot around using foot movements, and bend at knees if needed when reaching for articles.  Carrying Luggage   Distribute weight evenly on both sides. Use a cart whenever possible. Do not twist trunk. Move body as a unit.   Lifting Principles .Maintain proper posture and head alignment. .Slide object as close as possible before lifting. .Move obstacles out of the way. .Test before lifting; ask for help if too heavy. .Tighten stomach muscles without holding breath. .Use smooth movements; do not jerk. .Use legs to do the work, and pivot with feet. .Distribute the work load symmetrically and close to the center of trunk. .Push instead of pull whenever possible.   Ask  For Help   Ask for help and delegate to others when possible. Coordinate your movements when lifting together, and maintain the low back curve.  Log Roll   Lying on back, bend left knee and place left arm across chest. Roll all in one movement to the right. Reverse to roll to the left. Always move as one unit. Housework - Sweeping  Use long-handled equipment to avoid stooping.   Housework - Wiping  Position yourself as close as possible to reach work surface. Avoid straining your back.  Laundry - Unloading  Wash   To unload small items at bottom of washer, lift leg opposite to arm being used to reach.  Downing close to area to be raked. Use arm movements to do the work. Keep back straight and avoid twisting.     Cart  When reaching into cart with one arm, lift opposite leg to keep back straight.   Getting Into / Out of Bed  Lower self to lie down on one side by raising legs and lowering head at the same time. Use arms to assist moving without twisting. Bend both knees to roll onto back if desired. To sit up, start from lying on side, and use same move-ments in reverse. Housework - Vacuuming  Hold the vacuum with arm held at side. Step back and forth to move it, keeping head up. Avoid twisting.   Laundry - IT consultant so that bending and twisting can be avoided.   Laundry - Unloading Dryer  Squat down to reach into clothes dryer or use a reacher.  Gardening - Weeding / Probation officer or Kneel. Knee pads may be helpful.                    From exercise drawer:IT band, groin and calf stretch on step 3 X 30 seconds daily each.

## 2016-02-01 ENCOUNTER — Ambulatory Visit: Payer: BLUE CROSS/BLUE SHIELD | Admitting: Physical Therapy

## 2016-02-02 ENCOUNTER — Ambulatory Visit: Payer: BLUE CROSS/BLUE SHIELD

## 2016-02-08 ENCOUNTER — Ambulatory Visit: Payer: BLUE CROSS/BLUE SHIELD | Admitting: Physical Therapy

## 2016-03-02 ENCOUNTER — Encounter
Payer: BLUE CROSS/BLUE SHIELD | Attending: Physical Medicine & Rehabilitation | Admitting: Physical Medicine & Rehabilitation

## 2016-03-02 DIAGNOSIS — Z5181 Encounter for therapeutic drug level monitoring: Secondary | ICD-10-CM | POA: Insufficient documentation

## 2016-03-02 DIAGNOSIS — Z79899 Other long term (current) drug therapy: Secondary | ICD-10-CM | POA: Insufficient documentation

## 2016-03-02 DIAGNOSIS — M79651 Pain in right thigh: Secondary | ICD-10-CM | POA: Insufficient documentation

## 2016-03-02 DIAGNOSIS — M79604 Pain in right leg: Secondary | ICD-10-CM | POA: Insufficient documentation

## 2016-03-02 DIAGNOSIS — Z87891 Personal history of nicotine dependence: Secondary | ICD-10-CM | POA: Insufficient documentation

## 2016-03-02 DIAGNOSIS — G894 Chronic pain syndrome: Secondary | ICD-10-CM | POA: Insufficient documentation

## 2017-01-17 ENCOUNTER — Ambulatory Visit: Payer: BLUE CROSS/BLUE SHIELD | Admitting: Adult Health

## 2017-01-31 ENCOUNTER — Ambulatory Visit: Payer: BLUE CROSS/BLUE SHIELD | Admitting: Adult Health

## 2017-06-14 ENCOUNTER — Ambulatory Visit
Admission: RE | Admit: 2017-06-14 | Discharge: 2017-06-14 | Disposition: A | Discharge status: Home / Self Care | Payer: PRIVATE HEALTH INSURANCE | Attending: Hematology & Oncology | Admitting: Hematology & Oncology

## 2017-06-14 DIAGNOSIS — Z95828 Presence of other vascular implants and grafts: Secondary | ICD-10-CM

## 2017-06-14 DIAGNOSIS — M5441 Lumbago with sciatica, right side: Secondary | ICD-10-CM

## 2017-06-14 DIAGNOSIS — C182 Malignant neoplasm of ascending colon: Principal | ICD-10-CM

## 2017-06-14 DIAGNOSIS — Z8719 Personal history of other diseases of the digestive system: Secondary | ICD-10-CM

## 2017-06-14 DIAGNOSIS — G8929 Other chronic pain: Secondary | ICD-10-CM

## 2017-06-14 MED ORDER — HYDROCODONE 5 MG-ACETAMINOPHEN 325 MG TABLET
ORAL_TABLET | Freq: Three times a day (TID) | ORAL | 0 refills | 0 days | Status: CP | PRN
Start: 2017-06-14 — End: 2018-01-15

## 2017-06-24 MED ORDER — PROCHLORPERAZINE MALEATE 10 MG TABLET
ORAL_TABLET | Freq: Four times a day (QID) | ORAL | 5 refills | 0.00000 days | Status: CP | PRN
Start: 2017-06-24 — End: 2017-07-01

## 2017-06-24 MED ORDER — DIPHENOXYLATE-ATROPINE 2.5 MG-0.025 MG TABLET
ORAL_TABLET | ORAL | 5 refills | 0 days | Status: CP | PRN
Start: 2017-06-24 — End: 2018-01-15

## 2017-06-24 MED ORDER — PYRIDOXINE (VITAMIN B6) 50 MG TABLET
ORAL_TABLET | 5 refills | 0 days | Status: CP
Start: 2017-06-24 — End: 2018-11-12

## 2017-06-27 MED ORDER — CAPECITABINE 500 MG TABLET
ORAL_TABLET | 7 refills | 0 days | Status: CP
Start: 2017-06-27 — End: 2017-06-27

## 2017-06-27 MED ORDER — CAPECITABINE 500 MG TABLET: tablet | 7 refills | 0 days | Status: AC

## 2017-06-29 MED ORDER — CAPECITABINE 500 MG TABLET
ORAL_TABLET | 7 refills | 0 days | Status: CP
Start: 2017-06-29 — End: 2017-06-27

## 2017-07-04 MED ORDER — CAPECITABINE 500 MG TABLET
ORAL_TABLET | ORAL | 7 refills | 0.00000 days | Status: CP
Start: 2017-07-04 — End: 2017-07-03

## 2017-07-06 ENCOUNTER — Ambulatory Visit
Admission: RE | Admit: 2017-07-06 | Discharge: 2017-07-06 | Disposition: A | Discharge status: Home / Self Care | Payer: MEDICAID

## 2017-07-06 DIAGNOSIS — Z95828 Presence of other vascular implants and grafts: Secondary | ICD-10-CM

## 2017-07-06 DIAGNOSIS — C182 Malignant neoplasm of ascending colon: Principal | ICD-10-CM

## 2017-08-01 ENCOUNTER — Ambulatory Visit: Admission: RE | Admit: 2017-08-01 | Discharge: 2017-08-01 | Payer: PRIVATE HEALTH INSURANCE

## 2017-08-01 DIAGNOSIS — T80211D Bloodstream infection due to central venous catheter, subsequent encounter: Secondary | ICD-10-CM

## 2017-08-01 DIAGNOSIS — T80212D Local infection due to central venous catheter, subsequent encounter: Principal | ICD-10-CM

## 2017-08-07 ENCOUNTER — Ambulatory Visit: Admission: RE | Admit: 2017-08-07 | Discharge: 2017-08-07 | Payer: PRIVATE HEALTH INSURANCE

## 2017-08-07 DIAGNOSIS — T80211D Bloodstream infection due to central venous catheter, subsequent encounter: Secondary | ICD-10-CM

## 2017-08-07 DIAGNOSIS — T80212D Local infection due to central venous catheter, subsequent encounter: Principal | ICD-10-CM

## 2017-08-07 DIAGNOSIS — C182 Malignant neoplasm of ascending colon: Secondary | ICD-10-CM

## 2017-08-10 ENCOUNTER — Ambulatory Visit
Admission: RE | Admit: 2017-08-10 | Discharge: 2017-08-10 | Disposition: A | Discharge status: Home / Self Care | Payer: MEDICAID

## 2017-08-10 ENCOUNTER — Ambulatory Visit
Admission: RE | Admit: 2017-08-10 | Discharge: 2017-08-10 | Disposition: A | Discharge status: Home / Self Care | Payer: PRIVATE HEALTH INSURANCE

## 2017-08-10 DIAGNOSIS — C189 Malignant neoplasm of colon, unspecified: Principal | ICD-10-CM

## 2017-08-15 ENCOUNTER — Ambulatory Visit
Admission: RE | Admit: 2017-08-15 | Discharge: 2017-08-15 | Disposition: A | Discharge status: Home / Self Care | Payer: MEDICAID

## 2017-08-15 DIAGNOSIS — C182 Malignant neoplasm of ascending colon: Principal | ICD-10-CM

## 2017-08-17 ENCOUNTER — Ambulatory Visit
Admission: RE | Admit: 2017-08-17 | Discharge: 2017-08-17 | Disposition: A | Discharge status: Home / Self Care | Payer: PRIVATE HEALTH INSURANCE

## 2017-08-17 ENCOUNTER — Ambulatory Visit
Admission: RE | Admit: 2017-08-17 | Discharge: 2017-08-17 | Disposition: A | Discharge status: Home / Self Care | Payer: MEDICAID | Attending: Hematology & Oncology | Admitting: Hematology & Oncology

## 2017-08-17 DIAGNOSIS — Z95828 Presence of other vascular implants and grafts: Secondary | ICD-10-CM

## 2017-08-17 DIAGNOSIS — C182 Malignant neoplasm of ascending colon: Principal | ICD-10-CM

## 2017-08-17 MED ORDER — MIRTAZAPINE 15 MG DISINTEGRATING TABLET
ORAL_TABLET | Freq: Every evening | ORAL | 2 refills | 0.00000 days | Status: CP
Start: 2017-08-17 — End: 2017-10-19

## 2017-08-22 ENCOUNTER — Ambulatory Visit: Admission: RE | Admit: 2017-08-22 | Discharge: 2017-08-22 | Payer: MEDICAID

## 2017-08-22 DIAGNOSIS — C182 Malignant neoplasm of ascending colon: Principal | ICD-10-CM

## 2017-08-28 ENCOUNTER — Ambulatory Visit: Admission: RE | Admit: 2017-08-28 | Discharge: 2017-08-28 | Payer: PRIVATE HEALTH INSURANCE

## 2017-09-05 ENCOUNTER — Ambulatory Visit
Admission: RE | Admit: 2017-09-05 | Discharge: 2017-09-05 | Disposition: A | Discharge status: Home / Self Care | Payer: PRIVATE HEALTH INSURANCE

## 2017-09-05 DIAGNOSIS — C182 Malignant neoplasm of ascending colon: Principal | ICD-10-CM

## 2017-09-12 ENCOUNTER — Ambulatory Visit
Admission: RE | Admit: 2017-09-12 | Discharge: 2017-09-12 | Disposition: A | Discharge status: Home / Self Care | Payer: PRIVATE HEALTH INSURANCE

## 2017-09-12 DIAGNOSIS — C182 Malignant neoplasm of ascending colon: Principal | ICD-10-CM

## 2017-09-18 ENCOUNTER — Ambulatory Visit
Admission: RE | Admit: 2017-09-18 | Discharge: 2017-09-18 | Disposition: A | Discharge status: Home / Self Care | Payer: PRIVATE HEALTH INSURANCE

## 2017-09-18 ENCOUNTER — Ambulatory Visit
Admission: RE | Admit: 2017-09-18 | Discharge: 2017-09-18 | Disposition: A | Discharge status: Home / Self Care | Payer: MEDICAID | Attending: Hematology & Oncology | Admitting: Hematology & Oncology

## 2017-09-18 DIAGNOSIS — C189 Malignant neoplasm of colon, unspecified: Principal | ICD-10-CM

## 2017-09-18 DIAGNOSIS — Z85038 Personal history of other malignant neoplasm of large intestine: Secondary | ICD-10-CM

## 2017-09-18 DIAGNOSIS — Z5111 Encounter for antineoplastic chemotherapy: Secondary | ICD-10-CM

## 2017-09-18 DIAGNOSIS — M159 Polyosteoarthritis, unspecified: Secondary | ICD-10-CM

## 2017-09-18 MED ORDER — ESOMEPRAZOLE MAGNESIUM 20 MG CAPSULE,DELAYED RELEASE
ORAL_CAPSULE | Freq: Every day | ORAL | 3 refills | 0.00000 days | Status: CP
Start: 2017-09-18 — End: 2018-01-15

## 2017-09-24 ENCOUNTER — Ambulatory Visit
Admission: RE | Admit: 2017-09-24 | Discharge: 2017-09-24 | Disposition: A | Discharge status: Home / Self Care | Payer: PRIVATE HEALTH INSURANCE

## 2017-09-24 DIAGNOSIS — Z5111 Encounter for antineoplastic chemotherapy: Secondary | ICD-10-CM

## 2017-09-24 DIAGNOSIS — Z85038 Personal history of other malignant neoplasm of large intestine: Secondary | ICD-10-CM

## 2017-09-24 DIAGNOSIS — C189 Malignant neoplasm of colon, unspecified: Principal | ICD-10-CM

## 2017-09-25 ENCOUNTER — Ambulatory Visit
Admission: RE | Admit: 2017-09-25 | Discharge: 2017-09-25 | Disposition: A | Discharge status: Home / Self Care | Payer: MEDICAID

## 2017-09-25 DIAGNOSIS — Z95828 Presence of other vascular implants and grafts: Secondary | ICD-10-CM

## 2017-09-25 DIAGNOSIS — C182 Malignant neoplasm of ascending colon: Principal | ICD-10-CM

## 2017-10-11 ENCOUNTER — Ambulatory Visit
Admission: RE | Admit: 2017-10-11 | Discharge: 2017-10-11 | Disposition: A | Discharge status: Home / Self Care | Payer: PRIVATE HEALTH INSURANCE

## 2017-10-11 ENCOUNTER — Ambulatory Visit
Admission: RE | Admit: 2017-10-11 | Discharge: 2017-10-11 | Disposition: A | Discharge status: Home / Self Care | Payer: PRIVATE HEALTH INSURANCE | Attending: Hematology & Oncology | Admitting: Hematology & Oncology

## 2017-10-11 DIAGNOSIS — Z5111 Encounter for antineoplastic chemotherapy: Secondary | ICD-10-CM

## 2017-10-11 DIAGNOSIS — C180 Malignant neoplasm of cecum: Principal | ICD-10-CM

## 2017-10-11 DIAGNOSIS — C189 Malignant neoplasm of colon, unspecified: Principal | ICD-10-CM

## 2017-10-11 DIAGNOSIS — C182 Malignant neoplasm of ascending colon: Principal | ICD-10-CM

## 2017-10-11 DIAGNOSIS — Z85038 Personal history of other malignant neoplasm of large intestine: Secondary | ICD-10-CM

## 2017-10-19 ENCOUNTER — Ambulatory Visit
Admission: RE | Admit: 2017-10-19 | Discharge: 2017-10-19 | Disposition: A | Discharge status: Home / Self Care | Payer: MEDICAID | Attending: Hematology & Oncology | Admitting: Hematology & Oncology

## 2017-10-19 ENCOUNTER — Ambulatory Visit
Admission: RE | Admit: 2017-10-19 | Discharge: 2017-10-19 | Disposition: A | Discharge status: Home / Self Care | Payer: PRIVATE HEALTH INSURANCE

## 2017-10-19 DIAGNOSIS — Z85038 Personal history of other malignant neoplasm of large intestine: Secondary | ICD-10-CM

## 2017-10-19 DIAGNOSIS — Z5111 Encounter for antineoplastic chemotherapy: Secondary | ICD-10-CM

## 2017-10-19 DIAGNOSIS — C182 Malignant neoplasm of ascending colon: Principal | ICD-10-CM

## 2017-10-19 DIAGNOSIS — C189 Malignant neoplasm of colon, unspecified: Principal | ICD-10-CM

## 2017-10-31 ENCOUNTER — Ambulatory Visit
Admission: RE | Admit: 2017-10-31 | Discharge: 2017-10-31 | Disposition: A | Discharge status: Home / Self Care | Payer: PRIVATE HEALTH INSURANCE

## 2017-10-31 DIAGNOSIS — C182 Malignant neoplasm of ascending colon: Principal | ICD-10-CM

## 2017-11-09 ENCOUNTER — Ambulatory Visit
Admission: RE | Admit: 2017-11-09 | Discharge: 2017-11-09 | Disposition: A | Discharge status: Home / Self Care | Payer: PRIVATE HEALTH INSURANCE

## 2017-11-09 ENCOUNTER — Ambulatory Visit
Admission: RE | Admit: 2017-11-09 | Discharge: 2017-11-09 | Disposition: A | Discharge status: Home / Self Care | Payer: MEDICAID | Attending: Hematology & Oncology | Admitting: Hematology & Oncology

## 2017-11-09 ENCOUNTER — Ambulatory Visit
Admission: RE | Admit: 2017-11-09 | Discharge: 2017-11-09 | Disposition: A | Discharge status: Home / Self Care | Payer: MEDICAID

## 2017-11-09 DIAGNOSIS — M1712 Unilateral primary osteoarthritis, left knee: Secondary | ICD-10-CM

## 2017-11-09 DIAGNOSIS — Z95828 Presence of other vascular implants and grafts: Principal | ICD-10-CM

## 2017-11-09 DIAGNOSIS — Z5111 Encounter for antineoplastic chemotherapy: Secondary | ICD-10-CM

## 2017-11-09 DIAGNOSIS — C182 Malignant neoplasm of ascending colon: Principal | ICD-10-CM

## 2017-11-30 ENCOUNTER — Ambulatory Visit
Admission: RE | Admit: 2017-11-30 | Discharge: 2017-11-30 | Disposition: A | Discharge status: Home / Self Care | Payer: PRIVATE HEALTH INSURANCE

## 2017-11-30 ENCOUNTER — Ambulatory Visit
Admission: RE | Admit: 2017-11-30 | Discharge: 2017-11-30 | Disposition: A | Discharge status: Home / Self Care | Payer: PRIVATE HEALTH INSURANCE | Attending: Hematology & Oncology | Admitting: Hematology & Oncology

## 2017-11-30 DIAGNOSIS — C182 Malignant neoplasm of ascending colon: Principal | ICD-10-CM

## 2017-11-30 DIAGNOSIS — Z09 Encounter for follow-up examination after completed treatment for conditions other than malignant neoplasm: Secondary | ICD-10-CM

## 2017-11-30 DIAGNOSIS — Z809 Family history of malignant neoplasm, unspecified: Secondary | ICD-10-CM

## 2017-12-19 ENCOUNTER — Encounter: Admit: 2017-12-19 | Discharge: 2017-12-20 | Payer: PRIVATE HEALTH INSURANCE

## 2017-12-19 DIAGNOSIS — C182 Malignant neoplasm of ascending colon: Principal | ICD-10-CM

## 2017-12-24 ENCOUNTER — Encounter: Admit: 2017-12-24 | Discharge: 2017-12-24 | Payer: PRIVATE HEALTH INSURANCE

## 2017-12-24 ENCOUNTER — Ambulatory Visit: Admit: 2017-12-24 | Discharge: 2017-12-24 | Payer: PRIVATE HEALTH INSURANCE

## 2017-12-24 DIAGNOSIS — D500 Iron deficiency anemia secondary to blood loss (chronic): Secondary | ICD-10-CM

## 2017-12-24 DIAGNOSIS — C180 Malignant neoplasm of cecum: Secondary | ICD-10-CM

## 2017-12-24 DIAGNOSIS — Z95828 Presence of other vascular implants and grafts: Principal | ICD-10-CM

## 2017-12-24 DIAGNOSIS — C182 Malignant neoplasm of ascending colon: Principal | ICD-10-CM

## 2017-12-24 DIAGNOSIS — Z09 Encounter for follow-up examination after completed treatment for conditions other than malignant neoplasm: Secondary | ICD-10-CM

## 2018-01-09 ENCOUNTER — Other Ambulatory Visit: Admit: 2018-01-09 | Discharge: 2018-01-09 | Payer: PRIVATE HEALTH INSURANCE

## 2018-01-09 DIAGNOSIS — C182 Malignant neoplasm of ascending colon: Principal | ICD-10-CM

## 2018-01-10 ENCOUNTER — Encounter
Admit: 2018-01-10 | Discharge: 2018-01-11 | Payer: PRIVATE HEALTH INSURANCE | Attending: Sports Medicine | Primary: Sports Medicine

## 2018-01-10 DIAGNOSIS — C189 Malignant neoplasm of colon, unspecified: Secondary | ICD-10-CM

## 2018-01-10 DIAGNOSIS — M1710 Unilateral primary osteoarthritis, unspecified knee: Principal | ICD-10-CM

## 2018-01-10 DIAGNOSIS — D500 Iron deficiency anemia secondary to blood loss (chronic): Secondary | ICD-10-CM

## 2018-01-15 ENCOUNTER — Encounter: Admit: 2018-01-15 | Discharge: 2018-01-15 | Payer: PRIVATE HEALTH INSURANCE

## 2018-01-15 DIAGNOSIS — C182 Malignant neoplasm of ascending colon: Principal | ICD-10-CM

## 2018-01-28 ENCOUNTER — Encounter: Admit: 2018-01-28 | Discharge: 2018-01-29 | Payer: PRIVATE HEALTH INSURANCE

## 2018-01-28 DIAGNOSIS — C182 Malignant neoplasm of ascending colon: Principal | ICD-10-CM

## 2018-02-08 ENCOUNTER — Encounter: Admit: 2018-02-08 | Discharge: 2018-02-09 | Payer: PRIVATE HEALTH INSURANCE

## 2018-03-18 ENCOUNTER — Encounter: Admit: 2018-03-18 | Discharge: 2018-03-19 | Payer: PRIVATE HEALTH INSURANCE

## 2018-03-18 DIAGNOSIS — C189 Malignant neoplasm of colon, unspecified: Principal | ICD-10-CM

## 2018-03-18 DIAGNOSIS — C182 Malignant neoplasm of ascending colon: Secondary | ICD-10-CM

## 2018-04-12 ENCOUNTER — Encounter: Admit: 2018-04-12 | Discharge: 2018-04-13 | Payer: PRIVATE HEALTH INSURANCE

## 2018-04-24 ENCOUNTER — Encounter
Admit: 2018-04-24 | Discharge: 2018-04-25 | Payer: PRIVATE HEALTH INSURANCE | Attending: Sports Medicine | Primary: Sports Medicine

## 2018-04-24 DIAGNOSIS — M1710 Unilateral primary osteoarthritis, unspecified knee: Principal | ICD-10-CM

## 2018-04-30 ENCOUNTER — Encounter: Admit: 2018-04-30 | Discharge: 2018-05-01 | Payer: PRIVATE HEALTH INSURANCE

## 2018-05-07 MED ORDER — OXYCODONE-ACETAMINOPHEN 5 MG-325 MG TABLET
ORAL_TABLET | Freq: Four times a day (QID) | ORAL | 0 refills | 0.00000 days | Status: CP | PRN
Start: 2018-05-07 — End: 2018-05-09

## 2018-05-08 ENCOUNTER — Encounter
Admit: 2018-05-08 | Discharge: 2018-05-09 | Payer: PRIVATE HEALTH INSURANCE | Attending: Sports Medicine | Primary: Sports Medicine

## 2018-05-08 DIAGNOSIS — Z96652 Presence of left artificial knee joint: Principal | ICD-10-CM

## 2018-05-09 MED ORDER — OXYCODONE-ACETAMINOPHEN 5 MG-325 MG TABLET
ORAL_TABLET | Freq: Four times a day (QID) | ORAL | 0 refills | 0 days | Status: CP | PRN
Start: 2018-05-09 — End: 2018-05-22

## 2018-05-15 ENCOUNTER — Ambulatory Visit
Admit: 2018-05-15 | Discharge: 2018-05-16 | Payer: PRIVATE HEALTH INSURANCE | Attending: Sports Medicine | Primary: Sports Medicine

## 2018-05-15 DIAGNOSIS — M1712 Unilateral primary osteoarthritis, left knee: Principal | ICD-10-CM

## 2018-05-15 DIAGNOSIS — Z96652 Presence of left artificial knee joint: Secondary | ICD-10-CM

## 2018-05-22 MED ORDER — OXYCODONE-ACETAMINOPHEN 5 MG-325 MG TABLET
ORAL_TABLET | Freq: Four times a day (QID) | ORAL | 0 refills | 0 days | Status: CP | PRN
Start: 2018-05-22 — End: 2018-05-27

## 2018-05-27 MED ORDER — OXYCODONE-ACETAMINOPHEN 5 MG-325 MG TABLET
ORAL_TABLET | Freq: Four times a day (QID) | ORAL | 0 refills | 0.00000 days | Status: CP | PRN
Start: 2018-05-27 — End: 2018-06-03

## 2018-06-03 MED ORDER — OXYCODONE-ACETAMINOPHEN 5 MG-325 MG TABLET
ORAL_TABLET | Freq: Four times a day (QID) | ORAL | 0 refills | 0.00000 days | Status: CP | PRN
Start: 2018-06-03 — End: 2018-06-10

## 2018-06-10 MED ORDER — OXYCODONE-ACETAMINOPHEN 5 MG-325 MG TABLET
ORAL_TABLET | Freq: Four times a day (QID) | ORAL | 0 refills | 0 days | Status: CP | PRN
Start: 2018-06-10 — End: 2019-02-18

## 2018-06-19 ENCOUNTER — Encounter
Admit: 2018-06-19 | Discharge: 2018-06-20 | Payer: PRIVATE HEALTH INSURANCE | Attending: Sports Medicine | Primary: Sports Medicine

## 2018-06-19 DIAGNOSIS — Z96652 Presence of left artificial knee joint: Principal | ICD-10-CM

## 2018-07-08 ENCOUNTER — Encounter: Admit: 2018-07-08 | Discharge: 2018-07-09 | Payer: PRIVATE HEALTH INSURANCE

## 2018-07-08 DIAGNOSIS — C182 Malignant neoplasm of ascending colon: Principal | ICD-10-CM

## 2018-07-15 ENCOUNTER — Encounter: Admit: 2018-07-15 | Discharge: 2018-07-16 | Payer: PRIVATE HEALTH INSURANCE

## 2018-07-15 ENCOUNTER — Ambulatory Visit: Admit: 2018-07-15 | Discharge: 2018-07-16 | Payer: PRIVATE HEALTH INSURANCE

## 2018-07-15 DIAGNOSIS — R768 Other specified abnormal immunological findings in serum: Principal | ICD-10-CM

## 2018-07-15 DIAGNOSIS — C182 Malignant neoplasm of ascending colon: Secondary | ICD-10-CM

## 2018-07-26 ENCOUNTER — Encounter: Admit: 2018-07-26 | Discharge: 2018-07-27 | Payer: PRIVATE HEALTH INSURANCE

## 2018-07-26 DIAGNOSIS — C182 Malignant neoplasm of ascending colon: Principal | ICD-10-CM

## 2018-09-04 ENCOUNTER — Encounter
Admit: 2018-09-04 | Discharge: 2018-09-04 | Payer: PRIVATE HEALTH INSURANCE | Attending: Gastroenterology | Primary: Gastroenterology

## 2018-09-04 DIAGNOSIS — R768 Other specified abnormal immunological findings in serum: Principal | ICD-10-CM

## 2018-09-04 DIAGNOSIS — C182 Malignant neoplasm of ascending colon: Secondary | ICD-10-CM

## 2018-09-04 LAB — COMPREHENSIVE METABOLIC PANEL
ALBUMIN: 4.1 g/dL (ref 3.5–5.0)
ALKALINE PHOSPHATASE: 75 U/L (ref 38–126)
ALT (SGPT): 28 U/L (ref 19–72)
ANION GAP: 7 mmol/L — ABNORMAL LOW (ref 9–15)
AST (SGOT): 45 U/L (ref 19–55)
BILIRUBIN TOTAL: 0.8 mg/dL (ref 0.0–1.2)
BLOOD UREA NITROGEN: 18 mg/dL (ref 7–21)
BUN / CREAT RATIO: 20
CALCIUM: 9.6 mg/dL (ref 8.5–10.2)
CO2: 26 mmol/L (ref 22.0–30.0)
CREATININE: 0.89 mg/dL (ref 0.70–1.30)
EGFR CKD-EPI AA MALE: >90 mL/min/{1.73_m2} (ref >=60)
EGFR CKD-EPI NON-AA MALE: >90 mL/min/{1.73_m2} (ref >=60)
GLUCOSE RANDOM: 95 mg/dL (ref 65–179)
POTASSIUM: 4.4 mmol/L (ref 3.5–5.0)
PROTEIN TOTAL: 8 g/dL (ref 6.5–8.3)
SODIUM: 138 mmol/L (ref 135–145)

## 2018-09-04 LAB — MEAN CORPUSCULAR HEMOGLOBIN: Lab: 31.2

## 2018-09-04 LAB — CBC
HEMATOCRIT: 42.2 % (ref 41.0–53.0)
HEMOGLOBIN: 13.8 g/dL (ref 13.5–17.5)
MEAN CORPUSCULAR HEMOGLOBIN CONC: 32.7 g/dL (ref 31.0–37.0)
MEAN CORPUSCULAR HEMOGLOBIN: 31.2 pg (ref 26.0–34.0)
MEAN PLATELET VOLUME: 12.5 fL — ABNORMAL HIGH (ref 7.0–10.0)
PLATELET COUNT: 140 10*9/L — ABNORMAL LOW (ref 150–440)
RED BLOOD CELL COUNT: 4.43 10*12/L — ABNORMAL LOW (ref 4.50–5.90)
RED CELL DISTRIBUTION WIDTH: 15.2 % — ABNORMAL HIGH (ref 12.0–15.0)
WBC ADJUSTED: 6 10*9/L (ref 4.5–11.0)

## 2018-09-04 LAB — GLUCOSE RANDOM: Glucose:MCnc:Pt:Ser/Plas:Qn:: 95

## 2018-09-04 NOTE — Unmapped (Signed)
[facility]    Bob Short, M.D.  Professor of Medicine  Director, [facility]  [place] [facility] [place] [facility] [place]    [phone]    Gwenevere Abbot, MD  [address] [place], [place] [address]     Chief complaint: Patient referred for consultation for chronic hepatitis C genotype 1a.    Present illness: The patient is a 62 y.o. African-American male with chronic hepatitis C genotype 1a.  Patient was diagnosed when he was undergoing therapy for advanced colon cancer.  He has a distant history of injecting drug use.  He never had acute icteric hepatitis.  Up until recently he had no idea that he may have had hepatitis C.  Overall he feels well.  He denies nausea, vomiting, abdominal pain, chest pain, or shortness of breath.  Patient has a history of stage IIIb colon cancer which was treated with resection.  He also had chemotherapy through May 2018.    10 system review of systems is negative    Active Ambulatory Problems     Diagnosis Date Noted   ??? Chronic back pain 08/17/2015   ??? Osteoarthrosis involving lower leg 08/17/2015   ??? Right leg pain 12/08/2015   ??? Colon cancer (CMS-HCC)    ??? Iron deficiency anemia due to chronic blood loss 05/31/2017   ??? Port catheter in place 06/14/2017   ??? Bloodstream infection due to Port-A-Cath 08/01/2017   ??? Melena 08/06/2017   ??? Anemia, unspecified 08/06/2017   ??? Malignant neoplasm of colon (CMS-HCC) 01/10/2018   ??? 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 Hepatitis C antibody test positive 07/15/2018     Resolved Ambulatory Problems     Diagnosis Date Noted   ??? Right thigh pain 12/08/2015     Past Medical History:   Diagnosis Date   ??? Cancer (CMS-HCC) 05/2017   ??? Osteoarthritis    ??? Pain in right leg    ??? Port catheter in place 06/08/2017     Liver care:  1.  Hepatitis B surface antigen negative  2.  HIV negative  3.  CT abdomen pelvis 06/2018: No evidence of metastatic disease.  4.  FIBROSCAN/2019: 8.6 kPa consistent with stage F2 moderate fibrosis.    No Known Allergies    Medications: None currently.    Social history: The patient works as a Education administrator.  He is married he has 7 children.  His wife has not been tested for hepatitis C which I have recommended to him.  He rarely drinks alcohol.  He smokes a half a pack of cigarettes per day    Family history: Negative for liver cancer or liver disease.    BP 150/89  - Pulse 61  - Temp 36.4 ??C (97.5 ??F)  - Resp 20  - Ht 182.9 cm (6')  - Wt 94.2 kg (207 lb 11.2 oz)  - SpO2 98%  - BMI 28.17 kg/m??     Pleasant individual in NAD    HEENT: Sclera are anicteric, no temporal muscle loss, oropharynx is negative  NECK: No thyromegaly or lymphadenopathy, No carotid bruits  Chest: Clear to auscultation and percussion  Heart: S1, S2, RR, No murmurs  Abdomen: Soft, non-tender, non-distended, no hepatosplenomegaly, no masses appreciated, no ascites  Skin: No spider angiomata, No rashes  Extremities: Without pedal edema, no palmar erythema  Neuro: Grossly intact, No focal deficits    Results for  orders placed or performed in visit on 09/04/18   Hepatitis A IgG   Result Value Ref Range    Hep A IgG Reactive (A) Nonreactive   Hepatitis B Surface Antibody   Result Value Ref Range    Hep B S Ab Nonreactive Nonreactive, [place]    Hep B Surf Ab Quant <8.00 <8.00 m(IU)/mL   Hepatitis B Core Antibody, total   Result Value Ref Range    Hep B Core Total Ab Nonreactive Nonreactive   Comprehensive Metabolic Panel   Result Value Ref Range    Sodium 138 135 - 145 mmol/L    Potassium 4.4 3.5 - 5.0 mmol/L    Chloride 105 98 - 107 mmol/L    CO2 26.0 22.0 - 30.0 mmol/L    BUN 18 7 - 21 mg/dL    Creatinine 1.61 0.96 - 1.30 mg/dL    BUN/Creatinine Ratio 20     EGFR CKD-EPI Non-African American, Male >90 >=60 mL/min/1.28m2    EGFR CKD-EPI African American, Male >90 >=60 mL/min/1.56m2    Glucose 95 65 - 179 mg/dL    Calcium 9.6 8.5 - 04.5 mg/dL    Albumin 4.1 3.5 - 5.0 g/dL    Total Protein 8.0 6.5 - 8.3 g/dL    Total Bilirubin 0.8 0.0 - 1.2 mg/dL    AST 45 19 - 55 U/L    ALT 28 19 - 72 U/L    Alkaline Phosphatase 75 38 - 126 U/L    Anion Gap 7 (L) 9 - 15 mmol/L   CBC   Result Value Ref Range    WBC 6.0 4.5 - 11.0 10*9/L    RBC 4.43 (L) 4.50 - 5.90 10*12/L    HGB 13.8 13.5 - 17.5 g/dL    HCT 40.9 81.1 - 91.4 %    MCV 95.4 80.0 - 100.0 fL    MCH 31.2 26.0 - 34.0 pg    MCHC 32.7 31.0 - 37.0 g/dL    RDW 78.2 (H) 95.6 - 15.0 %    MPV 12.5 (H) 7.0 - 10.0 fL    Platelet 140 (L) 150 - 440 10*9/L       Impression:  1.  Chronic hepatitis C genotype 1a.  Patient has moderate liver disease based upon FIBROSCAN.  He is otherwise asymptomatic. Today we discussed the natural history of HCV infection, including the risks for progression to cirrhosis, liver failure, liver cancer, and risks of hepatocellular carcinoma. Patient is aware of the need for continued follow-up and monitoring.  We discussed the importance of remaining abstinent from alcohol due to additive effects on disease progression to cirrhosis.  We discussed potential treatment options that include all-oral regimens with low rates of side effects and high rates of cure (sustained virological response).     Patient will meet with our pharmacist, Erskine Squibb, to review treatment options.    2.  Vaccination status for hepatitis A and hepatitis B: Appropriate serologies are pending.  Patient will be vaccinated as needed at the time of his next visit.    Patient will follow-up in 3 months with Owens Shark, DNP or sooner once hepatitis C medications have been started.    Bob Short, M.D.  Professor of Medicine  Director, [facility]  [place] [facility] [place] [facility] [place]    [phone]

## 2018-09-04 NOTE — Unmapped (Addendum)
[facility]  FAST ??? Fibrosis Assessment Team  Division of Gastroenterology and Hepatology  ??  ??    ??  FIBROSCAN will be performed to assess hepatic fibrosis (scarring) in order to stage this patient's liver disease. This will assist with evaluating the natural course of the disease and will provide important information regarding prognosis, duration of therapy, and potential response to treatment. This information will also help assess risk for hepatocellular carcinoma and need for liver cancer surveillance.??  ??  FibroscanProcedure:   After obtaining verbal consent, the patient was placed in a supine position. Physical characteristics and landmarks were assessed to establish appropriate mid-axillary intercostal space for probe placement. 50Hz  Shear Wave pulses were applied and the resulting Shear Wave and Propagation Speed detected with a 3.5 MHz ultrasonic signal, using the FibroScan probe.  Skin to liver capsule distance and liver parenchyma were accessed during the entire examination with the FibroScan probe. The patient was instructed to breathe normally and to abstain from sudden movements during the procedure, allowing for random measurements of liver stiffness. At least ten Shear Waves were produced; individual measurements of each Shear Wave were calculated. Patient tolerated the procedure well and was discharged without incident.  ??  Probe used []  M+    Serial # [id number]                         [x]  XL+   Serial # [id number]  ??  Main Etiology of Liver Disease:  [x] HCV     [] HBV   [] Alcohol    [] NASH  [] PBC      [] PSC     [] Other________________  ??  50Hz  shear wave pulses were applied and the resulting shear wave and propagation speed detected with a 3.5 MHz ultrasonic signal, using the FibroScan probe.     ??  At least ten Shear Waves were produced; individual measurements of each shear wave were calculated.    ??  Patient tolerated the procedure well and was discharged without incident.  ??  Fibroscan score: ___8.6___kPa  ??  IQR:                       ___1___%  ??  Test performed by: Luiz Ochoa, RN  ??  [facility]  FAST ??? Fibrosis Assessment Team  Division of Gastroenterology and Hepatology  ??  ??    ??  ??  ??  Estimation of the stage of liver fibrosis (Metavir Score):  The results of the Liver Stiffness Score are consistent with the following liver fibrosis stage:  ??  ??  []  F0-F1             [x]  F2               []  F3               []  F4  ??  ??  GENERAL RECOMMENDATIONS ACCORDING TO THE STAGE OF LIVER FIBROSIS.  ??  F0-F1: No-minimal fibrosis. The risk of progression to advanced fibrosis and cirrhosis is low. If the cause of liver disease is not removed, a 1-2 yr follow-up study is recommended.   F2: Significant fibrosis. There is a moderate risk of progression to cirrhosis. If the cause of liver disease is not removed, a follow-up study in 12 months is recommended.  F3: Advanced (pre-cirrhotic stage). The risk of progression to cirrhosis is high. Imaging studies to rule out hepatocellular carcinoma  should be considered. Efforts to remove the cause of liver disease are highly recommended.  F4: Cirrhosis. There is significant risk of portal hypertension and esophageal varices. An upper endoscopy is recommended. Imaging studies for hepatocellular carcinoma screening are recommended.   ??  Any and all FibroScan studies must be carefully evaluated, taking fully into account all individual measurement/scans, patient history and other factors.  As with liver biopsy, any estimation of liver fibrosis may be subject to under or over staging due to sampling error.  Any further medical or surgical intervention should be made only while fully considering the circumstances of this patient and in consultation with this patient.    I have reviewed and interpreted the FIBROSCAN test results as described above.

## 2018-09-04 NOTE — Unmapped (Addendum)
Chronic hepatitis C. You will be a good candidate for treatment with medications that have a high rate of cure and a low risk of side effects. Labs and FIBROSCAN today. You will meet our pharmacist Erskine Squibb to discuss starting therapy. Your wife should be tested on one occasion for hepatitis C.   Return to office in 3 months or sooner once hepatitis C medication has been started to see Owens Shark, DNP.

## 2018-09-04 NOTE — Unmapped (Signed)
Counseling for HCV treatment     B18.2 Hep C: yes    K74.60 Cirrhosis: no,   Child Pugh Score if applicable and for Medicaid pts: n/a  Z94.4 Liver Transplant: no    Genotype: 1a (07/15/18)  HCV RNA:  2,170,000 Iu/ml on 07/26/18   Fibrosis score: F2 (8.6 kPa) on Fibroscan on 09/04/18  HIV Co-infection? no  Signs of liver decompensation? no  Previous treatment? naive    Planned regimen: Harvoni (ledipasvir/sofosbuvir 90/400mg ) x 12 weeks  Urgency: Routine Request    Prescribing Provider/NPI: Dr. Gavin Short / [id number]  Signature waiver form not obtained at this time.  Insurance: Bob Short is 62 y.o. African American male who presents to clinic wife (in waiting room) and is interested in starting treatment with Harvoni. We discussed the prior authorization (PA) process of obtaining the medication through insurance and that this may take some time.      Current medications: Naproxen-Esomeprazole 500mg /20mg  1 cap BID.    Following topics were discussed during counseling:    1. Indications for medication, dosage and administration.     A. Harvoni 90/400mg  1 tablet to take daily with or without food.    2. Common side effects of medications and management strategies. (fatigue, headache)    3. Importance of adherence to regimen, follow-up clinic visits and lab monitoring.     A.Asked patient to call Bob Short at [phone] before starting treatment to establish start date and to schedule TW#4 appointment.    4. Drug-drug interaction.    A. Current medications have been reviewed and assessed for possible interaction.  Endorses esomprazole use with his Vimono. We discussed the mechanism of drug-drug interaction with acid lowering agents. Instructed to check with his PCP to see if ok to take plain naproxen without esomeprazole. Advised to check with MD or pharmacist before taking any OTC/herbal medications, with emphasis regarding indigestion/heartburn medications.  Denies use of herbal medication such as milk thistle or St. John's wart.  Allergies have been verified. Quit alcohol about 2 weeks ago. Recommended continued abstinence during treatment. Pt in agreement.    5. Importance of informing pharmacy and clinic of updated contact information.   Discussed the process of obtaining medication through specialty pharmacy and when approved medication will be delivered to patient's home. Stressed importance of being able to be reached over the phone.    Patient verbalized understanding. Provided contact information for any questions/concerns.       Bob Short, Pharm D., BCPS, BCGP, CPP  [facility]  [address]  [place], [place] [address]  [phone]    This portion of the visit was 15 minutes in duration and greater than 50% was spend in direct counseling and coordination of care regarding hepatitis C medication management.

## 2018-09-05 LAB — HEPATITIS B SURFACE ANTIBODY
HEPATITIS B SURFACE ANTIBODY QUANT: <8 m[IU]/mL (ref <8.00)
Hepatitis B virus surface Ab:PrThr:Pt:Ser:Ord:: NONREACTIVE

## 2018-09-05 LAB — HEPATITIS A IGG: Hepatitis A virus Ab.IgG:PrThr:Pt:Ser:Ord:: REACTIVE — AB

## 2018-09-05 LAB — HEPATITIS B CORE TOTAL ANTIBODY: Hepatitis B virus core Ab:PrThr:Pt:Ser/Plas:Ord:IA: NONREACTIVE

## 2018-09-11 NOTE — Unmapped (Signed)
Spoke with patient regarding naproxen/esomperazole DDI with Harvoni. Patient reports naproxen/esomeprazole was prescribed for his wife but he's been taking and it has been helpful. Advised not safe to take prescription medication that's not prescribed for him. Reviewed with patient that naproxen does not have any interactions with Harvoni but it's the esomeprazole that will interact. Advised pt may purchase OTC naproxen to see if that will control his pain before we proceed with Harvoni treatment. Pt will try OTC naproxen as he does not want any opioids (h/o opioid abuse) and will give me a call back.

## 2018-09-12 NOTE — Unmapped (Signed)
Addended by: Jalyric Kaestner M on: 09/12/2018 01:19 PM     Modules accepted: Orders

## 2018-09-23 MED ORDER — LEDIPASVIR 90 MG-SOFOSBUVIR 400 MG TABLET
ORAL_TABLET | Freq: Every day | ORAL | 2 refills | 0 days | Status: CP
Start: 2018-09-23 — End: 2018-10-01

## 2018-09-23 NOTE — Unmapped (Signed)
Followed up with patient regarding stopping naproxen/esomeprazole (see Telephone encounter 09/11/18 for details). Patient reports he's been doing fine on OTC naproxen 3x/day. Denies any stomach issues but recommended to take naproxen with food. Reviewed DDI between acid suppressive agents and Harvoni. Pt verbalized understanding. Will proceed with PA approval for Harvoni.

## 2018-10-01 MED ORDER — LEDIPASVIR 90 MG-SOFOSBUVIR 400 MG TABLET
ORAL_TABLET | Freq: Every day | ORAL | 2 refills | 0 days | Status: CP
Start: 2018-10-01 — End: 2019-04-14

## 2018-10-01 NOTE — Unmapped (Signed)
Initial Counseling for HCV Treatment     Planned regimen: Harvoni (ledipasvir/sofosbuvir 90/400mg ) x 12 weeks  Planned start date: TBD, pending delivery    Pharmacy: [facility] [phone])    PMH:   Past Medical History:   Diagnosis Date   ??? Cancer (CMS-HCC) 05/2017    Colon Cancer   ??? Colon cancer (CMS-HCC)     May 2018 R colectomy - stage IIIB   ??? Osteoarthritis     L spine DJD with radiculopathy, bilat hip DJD   ??? Pain in right leg    ??? Port catheter in place 06/08/2017    Right     Current meds: naproxen     Patient is ready to start Harvoni.     Following topics were discussed during counseling:   Patient Counseling    Counseled the patient on the following:  doses and administration discussed, possible adverse effects and management discussed, possible drug and prescription drug interactions discussed, possible drug and OTC drug and food interactions discussed, lab monitoring and follow-up discussed, cost of medications and cost implications discussed, adherence and missed doses discussed, pharmacy contact information discussed        1. Indications for medication, dosage and administration.     A. Harvoni 90/400mg  1 tablet to take daily with or without food.      2. Common side effects of medications and management strategies. (fatigue, headache)      3. Importance of adherence to regimen, follow-up clinic visits and lab monitoring.   Medication Adherence    Demonstrates understanding of importance of adherence:  yes  Informant:  patient  Patient is at risk for Non-Adherence:  No  Reasons for non-adherence:  no problems identified              Confirmed plan for next specialty medication refill:  outside pharmacy       A. Asked patient to call Carren Rang at [phone] establish start date for treatment and to schedule appointment 4 weeks before starting treatment.      4. Drug-drug interaction.  Drug Interactions    Drug interactions evaluated:  yes  Clinically relevant drug interactions identified:  no               A. Current medications have been reviewed and assessed for possible interaction.  We discussed the mechanism of drug-drug interaction with acid lowering agents. Advised to check with MD or pharmacist before taking any OTC/herbal medications, with emphasis regarding indigestion/heartburn medications.  Denies use of herbal medication such as milk thistle or St. John's wart.  Allergies have been verified. Reports 1 can of 24 oz Natrual light beer about a week ago. (Was celebrating that he got his driver's licence back with friends. Advised to avoid bad influences.) Reviewed effects of alcohol on HCV treatment and liver. Recommended abstinence. Pt in agreement.      5. Importance of informing pharmacy and clinic of updated contact information. Stressed importance of being able to reach over the phone to set up refills. Advised patient to call pharmacy when down to about 10 day supply left to ensure there's no interruption in therapy.       Patient verbalized understanding. Provided contact information for any questions/concerns.       Park Breed, Pharm D., BCPS, BCGP, CPP  [facility]  [address]  [place], [place] [address]  [phone]    October 01, 2018 9:37 AM

## 2018-10-01 NOTE — Unmapped (Signed)
Harvoni PA approved til 12/18/2018. Pt must fill through [facility]  [phone]) per insurance requirement. Transferred rx over.

## 2018-10-07 NOTE — Unmapped (Signed)
Patient called and reschedule appointment for a 3 month f/u until 11/11@10am .

## 2018-10-11 NOTE — Unmapped (Signed)
Follow-Up Counseling for HCV Treatment    Regimen: Harvoni (ledipasvir/sofosbuvir 90/400mg ) x 12 weeks  Start Date: 10/08/18  Completed Treatment Day #4    Pharmacy: [facility] [phone]    Following topics were reviewed during the phone call:  Patient Counseling    Counseled the patient on the following:  doses and administration discussed, possible adverse effects and management discussed, possible drug and prescription drug interactions discussed, possible drug and OTC drug and food interactions discussed, lab monitoring and follow-up discussed, therapeutic rationale discussed, adherence and missed doses discussed, pharmacy contact information discussed         1. Medication administration - Takes Harvoni every morning between 7:30 and 0800 am. Pt takes it right before he takes his daughter to school.     2. Importance of adherence -   Medication Adherence    Patient reported X missed doses in the last month:  0  Specialty Medication:  Harvoni  Patient is on additional specialty medications:  No  Patient is on more than two specialty medications:  No  Any gaps in refill history greater than 2 weeks in the last 3 months:  no  Demonstrates understanding of importance of adherence:  yes  Informant:  patient  Reliability of informant:  reliable  Provider-estimated medication adherence level:  90-100%  Patient is at risk for Non-Adherence:  No  Reasons for non-adherence:  no problems identified      Adherence tools used:  directed education          Confirmed plan for next specialty medication refill:  outside pharmacy       Pt reports taking 4 days of medication and denies missing any doses.     3. Side effects - No new side effects to report.  Adverse Effects        *All other systems reviewed and are negative         4. Drug-drug interaction  Pt was counseled on interaction between Claritin and Harvoni and advised to take 5 mg daily of Loratadine onlyas needed. Pt stated he usually only takes it once or twice a month. Pt denies any alcohol use since starting tx. Pt asked Is it ok if I smoke a little marijuana while on tx. Advised pt to abstain from marijuana while on tx. Pt verbalized understanding.   Drug Interactions    Drug interactions evaluated:  yes  Clinically relevant drug interactions identified:  no      Provided the patient with educational material regarding drug interactions:  yes   Educational material comments:  Loratadine 10 mg: P-glycoprotein/ABCB1 Inhibitors may increase the serum concentration of P-glycoprotein/ABCB1 Substrates. P-glycoprotein inhibitors may also enhance the distribution of p-glycoprotein substrates to specific cells/tissues/organs where p-glycoprotein is present in large amounts (e.g., brain, T-lymphocytes, testes, etc.). Severity Moderate Reliability Rating Fair   After speaking with Bob Short, CPP pt was advised to only take 5 mg of Loratadine once daily as needed.           5. Follow up - Has follow up appointment scheduled in HCV treatment clinic on12/3/19 @ 0900 am with Bob Shark, DNP. Advised patient to call pharmacy when down to about 7 day supply left to ensure there's no interruption in therapy.      All questions were answered.      Bob Limber RN, BSN  Nursing Care Coordinator   Pharmacy Adult GI Medicine  [facility]  [address]   [place], [place] [address]  [phone]    October 11, 2018 9:14 AM

## 2018-10-21 NOTE — Unmapped (Signed)
Called patient about missed appt, had to leave a voicemail asking him to call the office.

## 2018-10-22 NOTE — Unmapped (Signed)
Follow-Up Counseling for HCV Treatment    Regimen: Harvoni (ledipasvir/sofosbuvir 90/400mg ) x 12 weeks  Start Date: 10/08/18  Completed Treatment Week #2    Pharmacy: [facility] [phone]    Following topics were reviewed during the phone call:  Patient Counseling    Counseled the patient on the following:  doses and administration discussed, possible adverse effects and management discussed, possible drug and prescription drug interactions discussed, possible drug and OTC drug and food interactions discussed, lab monitoring and follow-up discussed, therapeutic rationale discussed, adherence and missed doses discussed, pharmacy contact information discussed         1. Medication administration - Takes Harvoni every day between 7:30 & 8:00 am before he takes his daughter to school. Pt admits to a couple times taking it later around 10:00 am.     2. Importance of adherence -   Medication Adherence    Patient reported X missed doses in the last month:  0  Specialty Medication:  Harvoni  Patient is on additional specialty medications:  No  Patient is on more than two specialty medications:  No  Any gaps in refill history greater than 2 weeks in the last 3 months:  no  Demonstrates understanding of importance of adherence:  yes  Informant:  patient  Reliability of informant:  reliable  Provider-estimated medication adherence level:  90-100%  Patient is at risk for Non-Adherence:  No  Reasons for non-adherence:  no problems identified      Adherence tools used:  directed education          Confirmed plan for next specialty medication refill:  outside pharmacy        Pill count over the phone revealed #13 tablets which is appropriate.    3. Side effects - No new side effects to report.  Adverse Effects        *All other systems reviewed and are negative         4. Drug-drug interaction -Pt reports taking something over the counter like Viagra. Pt states  I can't remember the name of it, I was experimenting. Stressed the importance of not taking any thing prescribed or OTC without consulting with our office first. Pt verbalized understanding. Pt denies any alcohol use. Pt reports hitting on one joint weekly. Stressed the importance of abstaining from marijuana during tx. Pt states it is hard because my wife smokes and she lights up in bed beside me so I take a hit.      Drug Interactions    Drug interactions evaluated:  yes  Clinically relevant drug interactions identified:  no                 5. Follow up - Has follow up appointment scheduled in HCV treatment clinic on 11/12/18 @ 0900 with Owens Shark, DNP.  Advised patient to call pharmacy when down to about 7 day supply left to ensure there's no interruption in therapy.      All questions were answered.      Vertell Limber RN, [facility]   Pharmacy Adult GI Medicine  [facility]  [address]   [place], [place] [address]  [phone]    October 22, 2018 2:13 PM

## 2018-11-12 ENCOUNTER — Encounter: Admit: 2018-11-12 | Discharge: 2018-11-13 | Payer: PRIVATE HEALTH INSURANCE | Attending: Family | Primary: Family

## 2018-11-12 DIAGNOSIS — B182 Chronic viral hepatitis C: Principal | ICD-10-CM

## 2018-11-12 DIAGNOSIS — Z9229 Personal history of other drug therapy: Secondary | ICD-10-CM

## 2019-01-06 ENCOUNTER — Ambulatory Visit: Admit: 2019-01-06 | Discharge: 2019-01-07 | Payer: PRIVATE HEALTH INSURANCE | Attending: Family | Primary: Family

## 2019-01-06 DIAGNOSIS — B182 Chronic viral hepatitis C: Principal | ICD-10-CM

## 2019-02-18 ENCOUNTER — Ambulatory Visit: Admit: 2019-02-18 | Discharge: 2019-02-18 | Payer: PRIVATE HEALTH INSURANCE

## 2019-02-18 DIAGNOSIS — C189 Malignant neoplasm of colon, unspecified: Principal | ICD-10-CM

## 2019-02-18 DIAGNOSIS — C801 Malignant (primary) neoplasm, unspecified: Principal | ICD-10-CM

## 2019-02-18 DIAGNOSIS — C182 Malignant neoplasm of ascending colon: Principal | ICD-10-CM

## 2019-02-18 DIAGNOSIS — R768 Other specified abnormal immunological findings in serum: Principal | ICD-10-CM

## 2019-02-18 DIAGNOSIS — D649 Anemia, unspecified: Principal | ICD-10-CM

## 2019-02-18 DIAGNOSIS — I10 Essential (primary) hypertension: Principal | ICD-10-CM

## 2019-02-18 DIAGNOSIS — M1990 Unspecified osteoarthritis, unspecified site: Principal | ICD-10-CM

## 2019-02-18 DIAGNOSIS — Z95828 Presence of other vascular implants and grafts: Principal | ICD-10-CM

## 2019-02-18 DIAGNOSIS — M79604 Pain in right leg: Principal | ICD-10-CM

## 2019-04-14 ENCOUNTER — Telehealth: Admit: 2019-04-14 | Discharge: 2019-04-15 | Payer: PRIVATE HEALTH INSURANCE | Attending: Family | Primary: Family

## 2019-04-14 DIAGNOSIS — B182 Chronic viral hepatitis C: Principal | ICD-10-CM

## 2019-05-26 ENCOUNTER — Encounter: Admit: 2019-05-26 | Discharge: 2019-05-27 | Payer: PRIVATE HEALTH INSURANCE

## 2019-05-26 DIAGNOSIS — C182 Malignant neoplasm of ascending colon: Principal | ICD-10-CM

## 2019-05-29 ENCOUNTER — Encounter: Admit: 2019-05-29 | Discharge: 2019-05-30 | Payer: PRIVATE HEALTH INSURANCE

## 2019-05-29 DIAGNOSIS — Z720 Tobacco use: Secondary | ICD-10-CM

## 2019-05-29 DIAGNOSIS — D649 Anemia, unspecified: Secondary | ICD-10-CM

## 2019-05-29 DIAGNOSIS — R911 Solitary pulmonary nodule: Principal | ICD-10-CM

## 2019-05-29 DIAGNOSIS — C189 Malignant neoplasm of colon, unspecified: Secondary | ICD-10-CM

## 2019-05-29 DIAGNOSIS — D500 Iron deficiency anemia secondary to blood loss (chronic): Secondary | ICD-10-CM

## 2019-05-29 DIAGNOSIS — R768 Other specified abnormal immunological findings in serum: Secondary | ICD-10-CM

## 2019-08-25 DIAGNOSIS — C189 Malignant neoplasm of colon, unspecified: Secondary | ICD-10-CM

## 2019-09-11 ENCOUNTER — Inpatient Hospital Stay (HOSPITAL_COMMUNITY): Payer: BLUE CROSS/BLUE SHIELD | Admitting: Hematology

## 2019-09-22 ENCOUNTER — Ambulatory Visit (HOSPITAL_COMMUNITY): Payer: BLUE CROSS/BLUE SHIELD | Admitting: Hematology

## 2019-10-06 ENCOUNTER — Encounter (HOSPITAL_COMMUNITY): Payer: Self-pay | Admitting: *Deleted

## 2019-10-07 ENCOUNTER — Inpatient Hospital Stay (HOSPITAL_COMMUNITY): Payer: Self-pay | Attending: Hematology | Admitting: Hematology

## 2019-10-07 ENCOUNTER — Encounter (HOSPITAL_COMMUNITY): Payer: Self-pay

## 2019-10-07 ENCOUNTER — Encounter (HOSPITAL_COMMUNITY): Payer: Self-pay | Admitting: Hematology

## 2019-10-07 ENCOUNTER — Other Ambulatory Visit: Payer: Self-pay

## 2019-10-07 DIAGNOSIS — R918 Other nonspecific abnormal finding of lung field: Secondary | ICD-10-CM | POA: Insufficient documentation

## 2019-10-07 DIAGNOSIS — Z23 Encounter for immunization: Secondary | ICD-10-CM | POA: Insufficient documentation

## 2019-10-07 DIAGNOSIS — Z808 Family history of malignant neoplasm of other organs or systems: Secondary | ICD-10-CM | POA: Diagnosis not present

## 2019-10-07 DIAGNOSIS — F17210 Nicotine dependence, cigarettes, uncomplicated: Secondary | ICD-10-CM | POA: Insufficient documentation

## 2019-10-07 DIAGNOSIS — I10 Essential (primary) hypertension: Secondary | ICD-10-CM | POA: Diagnosis not present

## 2019-10-07 DIAGNOSIS — C180 Malignant neoplasm of cecum: Secondary | ICD-10-CM

## 2019-10-07 DIAGNOSIS — Z77090 Contact with and (suspected) exposure to asbestos: Secondary | ICD-10-CM | POA: Insufficient documentation

## 2019-10-07 DIAGNOSIS — Z79899 Other long term (current) drug therapy: Secondary | ICD-10-CM | POA: Insufficient documentation

## 2019-10-07 DIAGNOSIS — G629 Polyneuropathy, unspecified: Secondary | ICD-10-CM | POA: Insufficient documentation

## 2019-10-07 DIAGNOSIS — Z9221 Personal history of antineoplastic chemotherapy: Secondary | ICD-10-CM | POA: Diagnosis not present

## 2019-10-07 DIAGNOSIS — Z85038 Personal history of other malignant neoplasm of large intestine: Secondary | ICD-10-CM | POA: Insufficient documentation

## 2019-10-07 MED ORDER — INFLUENZA VAC SPLIT QUAD 0.5 ML IM SUSY
0.5000 mL | PREFILLED_SYRINGE | Freq: Once | INTRAMUSCULAR | Status: AC
  Administered 2019-10-07: 0.5 mL via INTRAMUSCULAR
  Filled 2019-10-07: qty 0.5

## 2019-10-07 NOTE — Patient Instructions (Signed)
[place] at [facility] Discharge Instructions  You were seen today by Dr. Delton Coombes. He went over your history, family history and how you've been feeling lately. He will schedule you for a repeat CT scan in 2 months. He will see you back in 6 weeks for labs and follow up.   Thank you for choosing [place] at [facility] to provide your oncology and hematology care.  To afford each patient quality time with our provider, please arrive at least 15 minutes before your scheduled appointment time.   If you have a lab appointment with the [place] please come in thru the  Main Entrance and check in at the main information desk  You need to re-schedule your appointment should you arrive 10 or more minutes late.  We strive to give you quality time with our providers, and arriving late affects you and other patients whose appointments are after yours.  Also, if you no show three or more times for appointments you may be dismissed from the clinic at the providers discretion.     Again, thank you for choosing [facility].  Our hope is that these requests will decrease the amount of time that you wait before being seen by our physicians.       _____________________________________________________________  Should you have questions after your visit to [facility], please contact our office at [phone] between the hours of 8:00 a.m. and 4:30 p.m.  Voicemails left after 4:00 p.m. will not be returned until the following business day.  For prescription refill requests, have your pharmacy contact our office and allow 72 hours.    Cancer Center Support Programs:   > Cancer Support Group  2nd Tuesday of the month 1pm-2pm, Journey Room

## 2019-10-07 NOTE — Progress Notes (Signed)
[facility] NOTE  No care team member to display  CHIEF COMPLAINTS/PURPOSE OF CONSULTATION:  Stage III cecal cancer.  HISTORY OF PRESENTING ILLNESS:  Evan [name] 63 y.o. male is seen in consultation today for surveillance of stage III cecal adenocarcinoma.  He was found to have a partially circumscribed ulcerated firm mass in the cecum on a colonoscopy on 05/04/2017, consistent with adenocarcinoma.  CT scan of the abdomen and pelvis on 05/04/2017 showed 3.5 cm soft mass in the cecum with partially necrotic appearing pericecal lymph nodes and a second lymph node slightly more cephalad.  CEA was 4.2.  He underwent laparoscopic right hemicolectomy with end-to-end anastomosis on 05/09/2017.  Pathology revealed 3.7 x 3.3 cm, well-differentiated adenocarcinoma involving the proximal ascending colon, 1/24 regional lymph nodes positive for metastatic disease.  Surgical margins were negative.  No lymphovascular or perineural invasion was identified.  Pathological staging is [id number].  He received 3 cycles of adjuvant XELOX from 07/06/2017 through 09/25/2017.  Oxaliplatin was held during cycle 4 onwards secondary to transaminases elevation and bilirubin elevation and thrombocytopenia after cycle 3.  He completed cycle 8 on 01/08/2018.  CT of the chest and abdomen and pelvis performed on 01/11/2018 showed no evidence of recurrence.  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  He was undergoing surveillance CT scans, last scan in June 2020.  Last CEA was also within normal limits.  He does report some residual numbness in the fingertips which is stable.  He also reported colonoscopy in 2019 which was reportedly normal.  Appetite is 50%.  Energy levels are 100%.  Denies any bleeding per rectum or melena.  Family history significant for brother who died of brain cancer.  Father also had cancer.  MEDICAL HISTORY:  Past Medical History:  Diagnosis Date  . Colon cancer ([place])   .  Hypertension     SURGICAL HISTORY: Past Surgical History:  Procedure Laterality Date  . REPLACEMENT TOTAL KNEE    . SHOULDER ARTHROSCOPY    . WRIST SURGERY Left     SOCIAL HISTORY: Social History   Socioeconomic History  . Marital status: Married    Spouse name: Not on file  . Number of children: 7  . Years of education: Not on file  . Highest education level: Not on file  Occupational History  . Occupation: employed  Scientific laboratory technician  . Financial resource strain: Not very hard  . Food insecurity    Worry: Never true    Inability: Never true  . Transportation needs    Medical: No    Non-medical: No  Tobacco Use  . Smoking status: Current Every Day Smoker    Packs/day: 0.50    Types: Cigarettes    Last attempt to quit: 11/19/2005    Years since quitting: 13.8  . Smokeless tobacco: Never Used  Substance and Sexual Activity  . Alcohol use: Yes    Comment: occasional beer  . Drug use: Yes    Types: Marijuana  . Sexual activity: Yes  Lifestyle  . Physical activity    Days per week: 0 days    Minutes per session: 0 min  . Stress: Not at all  Relationships  . Social Herbalist on phone: Once a week    Gets together: Once a week    Attends religious service: More than 4 times per year    Active member of club or organization: Yes    Attends meetings of clubs or  organizations: More than 4 times per year    Relationship status: Married  . Intimate partner violence    Fear of current or ex partner: No    Emotionally abused: No    Physically abused: No    Forced sexual activity: No  Other Topics Concern  . Not on file  Social History Narrative  . Not on file    FAMILY HISTORY: Family History  Problem Relation Age of Onset  . Heart disease Mother   . Dementia Father   . Heart disease Sister   . Cancer Brother   . Hypertension Brother   . Hypertension Brother   . Healthy Son   . Healthy Son   . Healthy Son   . Healthy Son   . Healthy Daughter    . Healthy Daughter   . Healthy Daughter     ALLERGIES:  has No Known Allergies.  MEDICATIONS:  Current Outpatient Medications  Medication Sig Dispense Refill  . BYSTOLIC 5 MG tablet Take 5 mg by mouth daily.    Marland Kitchen HYDROcodone-acetaminophen (NORCO) 5-325 MG tablet Take 1 tablet by mouth daily as needed for moderate pain. 30 tablet 0   No current facility-administered medications for this visit.     REVIEW OF SYSTEMS:   Constitutional: Denies fevers, chills or abnormal night sweats Eyes: Denies blurriness of vision, double vision or watery eyes Ears, nose, mouth, throat, and face: Denies mucositis or sore throat Respiratory: Denies cough, dyspnea or wheezes Cardiovascular: Denies palpitation, chest discomfort or lower extremity swelling Gastrointestinal:  Denies nausea, heartburn or change in bowel habits Skin: Denies abnormal skin rashes Lymphatics: Denies new lymphadenopathy or easy bruising Neurological:Denies numbness, tingling or new weaknesses Behavioral/Psych: Mood is stable, no new changes  All other systems were reviewed with the patient and are negative.  PHYSICAL EXAMINATION: ECOG PERFORMANCE STATUS: 0 - Asymptomatic  Vitals:   10/07/19 1300  BP: (!) 169/98  Pulse: (!) 57  Resp: 16  Temp: 97.7 F (36.5 C)  SpO2: 97%   Filed Weights   10/07/19 1300  Weight: 202 lb 2 oz (91.7 kg)    GENERAL:alert, no distress and comfortable SKIN: skin color, texture, turgor are normal, no rashes or significant lesions EYES: normal, conjunctiva are pink and non-injected, sclera clear OROPHARYNX:no exudate, no erythema and lips, buccal mucosa, and tongue normal  NECK: supple, thyroid normal size, non-tender, without nodularity LYMPH:  no palpable lymphadenopathy in the cervical, axillary or inguinal LUNGS: clear to auscultation and percussion with normal breathing effort HEART: regular rate & rhythm and no murmurs and no lower extremity edema ABDOMEN:abdomen soft,  non-tender and normal bowel sounds Musculoskeletal:no cyanosis of digits and no clubbing  PSYCH: alert & oriented x 3 with fluent speech NEURO: no focal motor/sensory deficits  LABORATORY DATA:  I have reviewed the data as listed Lab Results  Component Value Date   WBC 7.8 12/18/2013   HGB 14.2 12/18/2013   HCT 42.2 12/18/2013   MCV 94.4 12/18/2013   PLT 162.0 12/18/2013     Chemistry      Component Value Date/Time   NA 138 12/09/2013 1010   K 3.6 12/09/2013 1010   CL 104 12/09/2013 1010   CO2 26 12/09/2013 1010   BUN 11 12/09/2013 1010   CREATININE 1.0 12/09/2013 1010      Component Value Date/Time   CALCIUM 8.8 12/09/2013 1010   ALKPHOS 64 12/16/2013 1047   AST 63 (H) 12/16/2013 1047   ALT 65 (H) 12/16/2013 1047  BILITOT 1.1 12/16/2013 1047       RADIOGRAPHIC STUDIES: I have personally reviewed the radiological images as listed and agreed with the findings in the report.  ASSESSMENT & PLAN:  Cecal cancer (HCC) 1.  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  Margins were negative.  No lymphovascular or perineural invasion.  Preoperative CEA was 4.2. -Cycle 1 of XELOX on 07/06/2017, cycle 3 on 09/25/2017, oxaliplatin discontinued during cycle 4 secondary to transaminitis, elevated bilirubin and thrombocytopenia.  He completed cycle 8 of single agent Xeloda on 01/08/2018. -Since then he has been under surveillance.  Last CEA and CT scan of the abdomen and pelvis and June was negative for recurrence or metastatic disease. 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 -Last colonoscopy in 2019 was within normal limits by Dr. Ladona Horns. -I have recommended a repeat CT scan of the abdomen and pelvis along with a CEA level and LFTs in 2 months. -We have reviewed surveillance plan for his colon cancer with CT scan of the abdomen and pelvis  yearly from year 3-5.  We will also obtain CEA and other blood work with physical exam once every 6 months.  2.  Pleural plaques: -Notes from [facility] say that he has pleural plaques on the chest CT related to asbestos exposure. 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  He smoked 1 pack/day of cigarettes for 20 years and quit 12 years ago.  He started back smoking again half pack per day 1 year ago.   Orders Placed This Encounter  Procedures  . CT Abdomen Pelvis W Contrast    Standing Status:   Future    Standing Expiration Date:   10/06/2020    Order Specific Question:   ** REASON FOR EXAM (FREE TEXT)    Answer:   cecal cancer    Order Specific Question:   If indicated for the ordered procedure, I authorize the administration of contrast media per Radiology protocol    Answer:   Yes    Order Specific Question:   Preferred imaging location?    Answer:   [facility]    Order Specific Question:   Is Oral Contrast requested for this exam?    Answer:   Yes, Per Radiology protocol    Order Specific Question:   Radiology Contrast Protocol - do NOT remove file path    Answer:   [email or web address]  . CBC with Differential/Platelet    Standing Status:   Future    Standing Expiration Date:   10/06/2020  . Comprehensive metabolic panel    Standing Status:   Future    Standing Expiration Date:   10/06/2020  . CEA    Standing Status:   Future    Standing Expiration Date:   10/06/2020    All questions were answered. The patient knows to call the clinic with any problems, questions or concerns.     Derek Jack, MD 10/07/2019 7:08 PM

## 2019-10-07 NOTE — Assessment & Plan Note (Addendum)
1.  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  Margins were negative.  No lymphovascular or perineural invasion.  Preoperative CEA was 4.2. -Cycle 1 of XELOX on 07/06/2017, cycle 3 on 09/25/2017, oxaliplatin discontinued during cycle 4 secondary to transaminitis, elevated bilirubin and thrombocytopenia.  He completed cycle 8 of single agent Xeloda on 01/08/2018. -Since then he has been under surveillance.  Last CEA and CT scan of the abdomen and pelvis and June was negative for recurrence or metastatic disease. 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 -Last colonoscopy in 2019 was within normal limits by Dr. Ladona Horns. -I have recommended a repeat CT scan of the abdomen and pelvis along with a CEA level and LFTs in 2 months. -We have reviewed surveillance plan for his colon cancer with CT scan of the abdomen and pelvis yearly from year 3-5.  We will also obtain CEA and other blood work with physical exam once every 6 months.  2.  Pleural plaques: -Notes from [facility] say that he has pleural plaques on the chest CT related to asbestos exposure. 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  He smoked 1 pack/day of cigarettes for 20 years and quit 12 years ago.  He started back smoking again half pack per day 1 year ago.

## 2019-10-21 ENCOUNTER — Institutional Professional Consult (permissible substitution): Admit: 2019-10-21 | Discharge: 2019-10-22 | Attending: Family | Primary: Family

## 2019-12-16 ENCOUNTER — Inpatient Hospital Stay (HOSPITAL_COMMUNITY): Payer: Self-pay | Attending: Hematology

## 2019-12-16 ENCOUNTER — Ambulatory Visit (HOSPITAL_COMMUNITY): Admission: RE | Admit: 2019-12-16 | Payer: Self-pay | Source: Ambulatory Visit

## 2019-12-18 ENCOUNTER — Ambulatory Visit (HOSPITAL_COMMUNITY): Payer: BLUE CROSS/BLUE SHIELD | Admitting: Hematology

## 2020-02-26 ENCOUNTER — Encounter (HOSPITAL_COMMUNITY): Payer: Self-pay | Admitting: Lab

## 2020-02-26 NOTE — Progress Notes (Signed)
Received request from [place] for patient to be seen for HX of colon cancer.  Patient already established with our practice and therefore, only needs a follow up.  Patient was a no show / no cancel for CT, labs and follow up for January 2021.  Called patient today to reschedule.  No answer.  No voicemail available.

## 2020-10-26 NOTE — Progress Notes (Signed)
Pt. Needs orders for upcomming surgery.PST and labs. appointment on 10/27/20.Thanks.

## 2020-10-26 NOTE — Patient Instructions (Addendum)
DUE TO COVID-19 ONLY ONE VISITOR IS ALLOWED TO COME WITH YOU AND STAY IN THE WAITING ROOM ONLY DURING PRE OP AND PROCEDURE DAY OF SURGERY. THE 1 VISITOR  MAY VISIT WITH YOU AFTER SURGERY IN YOUR PRIVATE ROOM DURING VISITING HOURS ONLY!  YOU NEED TO HAVE A COVID 19 TEST ON: 11/05/20 @ 9:00 AM , THIS TEST MUST BE DONE BEFORE SURGERY,  COVID TESTING SITE [place]  [id number], IT IS ON THE RIGHT GOING OUT [address] APPROXIMATELY  2 MINUTES PAST [employer] ON THE RIGHT. ONCE YOUR COVID TEST IS COMPLETED,  PLEASE BEGIN THE QUARANTINE INSTRUCTIONS AS OUTLINED IN YOUR HANDOUT.                Evan [name]    Your procedure is scheduled on: 11/09/20   Report to [facility] Main  Entrance   Report to admitting at: 10:20 AM     Call this number if you have problems the morning of surgery [phone]    Remember: NO SOLID FOOD AFTER MIDNIGHT THE NIGHT PRIOR TO SURGERY. NOTHING BY MOUTH EXCEPT CLEAR LIQUIDS UNTIL: 9:50 AM . PLEASE FINISH ENSURE DRINK PER SURGEON ORDER  WHICH NEEDS TO BE COMPLETED AT : 9:50 AM.  CLEAR LIQUID DIET   Foods Allowed                                                                     Foods Excluded  Coffee and tea, regular and decaf                             liquids that you cannot  Plain Jell-O any favor except red or purple                                           see through such as: Fruit ices (not with fruit pulp)                                     milk, soups, orange juice  Iced Popsicles                                    All solid food Carbonated beverages, regular and diet                                    Cranberry, grape and apple juices Sports drinks like Gatorade Lightly seasoned clear broth or consume(fat free) Sugar, honey syrup  Sample Menu Breakfast                                Lunch  Supper Cranberry juice                    Beef broth                             Chicken broth Jell-O                                     Grape juice                           Apple juice Coffee or tea                        Jell-O                                      Popsicle                                                Coffee or tea                        Coffee or tea  _____________________________________________________________________  BRUSH YOUR TEETH MORNING OF SURGERY AND RINSE YOUR MOUTH OUT, NO CHEWING GUM CANDY OR MINTS.                                You may not have any metal on your body including hair pins and              piercings  Do not wear jewelry, lotions, powders or perfumes, deodorant             Men may shave face and neck.   Do not bring valuables to the hospital. Evan [name].  Contacts, dentures or bridgework may not be worn into surgery.  Leave suitcase in the car. After surgery it may be brought to your room.     Patients discharged the day of surgery will not be allowed to drive home. IF YOU ARE HAVING SURGERY AND GOING HOME THE SAME DAY, YOU MUST HAVE AN ADULT TO DRIVE YOU HOME AND BE WITH YOU FOR 24 HOURS. YOU MAY GO HOME BY TAXI OR UBER OR ORTHERWISE, BUT AN ADULT MUST ACCOMPANY YOU HOME AND STAY WITH YOU FOR 24 HOURS.  Name and phone number of your driver:  Special Instructions: N/A              Please read over the following fact sheets you were given: _____________________________________________________________________         [facility] - Preparing for Surgery Before surgery, you can play an important role.  Because skin is not sterile, your skin needs to be as free of germs as possible.  You can reduce the number of germs on your skin by washing with CHG (chlorahexidine gluconate) soap before surgery.  CHG is an antiseptic cleaner which kills germs and bonds with the  skin to continue killing germs even after washing. Please DO NOT use if you have an allergy to CHG or  antibacterial soaps.  If your skin becomes reddened/irritated stop using the CHG and inform your nurse when you arrive at Short Stay. Do not shave (including legs and underarms) for at least 48 hours prior to the first CHG shower.  You may shave your face/neck. Please follow these instructions carefully:  1.  Shower with CHG Soap the night before surgery and the  morning of Surgery.  2.  If you choose to wash your hair, wash your hair first as usual with your  normal  shampoo.  3.  After you shampoo, rinse your hair and body thoroughly to remove the  shampoo.                           4.  Use CHG as you would any other liquid soap.  You can apply chg directly  to the skin and wash                       Gently with a scrungie or clean washcloth.  5.  Apply the CHG Soap to your body ONLY FROM THE NECK DOWN.   Do not use on face/ open                           Wound or open sores. Avoid contact with eyes, ears mouth and genitals (private parts).                       Wash face,  Genitals (private parts) with your normal soap.             6.  Wash thoroughly, paying special attention to the area where your surgery  will be performed.  7.  Thoroughly rinse your body with warm water from the neck down.  8.  DO NOT shower/wash with your normal soap after using and rinsing off  the CHG Soap.                9.  Pat yourself dry with a clean towel.            10.  Wear clean pajamas.            11.  Place clean sheets on your bed the night of your first shower and do not  sleep with pets. Day of Surgery : Do not apply any lotions/deodorants the morning of surgery.  Please wear clean clothes to the hospital/surgery center.  FAILURE TO FOLLOW THESE INSTRUCTIONS MAY RESULT IN THE CANCELLATION OF YOUR SURGERY PATIENT SIGNATURE_________________________________  NURSE SIGNATURE__________________________________  ________________________________________________________________________   Evan [name]  An incentive spirometer is a tool that can help keep your lungs clear and active. This tool measures how well you are filling your lungs with each breath. Taking long deep breaths may help reverse or decrease the chance of developing breathing (pulmonary) problems (especially infection) following:  A long period of time when you are unable to move or be active. BEFORE THE PROCEDURE   If the spirometer includes an indicator to show your best effort, your nurse or respiratory therapist will set it to a desired goal.  If possible, sit up straight or lean slightly forward. Try not to slouch.  Hold the incentive spirometer in an upright position. INSTRUCTIONS FOR USE  1. Sit on the edge of your bed if possible, or sit up as far as you can in bed or on a chair. 2. Hold the incentive spirometer in an upright position. 3. Breathe out normally. 4. Place the mouthpiece in your mouth and seal your lips tightly around it. 5. Breathe in slowly and as deeply as possible, raising the piston or the ball toward the top of the column. 6. Hold your breath for 3-5 seconds or for as long as possible. Allow the piston or ball to fall to the bottom of the column. 7. Remove the mouthpiece from your mouth and breathe out normally. 8. Rest for a few seconds and repeat Steps 1 through 7 at least 10 times every 1-2 hours when you are awake. Take your time and take a few normal breaths between deep breaths. 9. The spirometer may include an indicator to show your best effort. Use the indicator as a goal to work toward during each repetition. 10. After each set of 10 deep breaths, practice coughing to be sure your lungs are clear. If you have an incision (the cut made at the time of surgery), support your incision when coughing by placing a pillow or rolled up towels firmly against it. Once you are able to get out of bed, walk around indoors and cough well. You may stop using the incentive spirometer when  instructed by your caregiver.  RISKS AND COMPLICATIONS  Take your time so you do not get dizzy or light-headed.  If you are in pain, you may need to take or ask for pain medication before doing incentive spirometry. It is harder to take a deep breath if you are having pain. AFTER USE  Rest and breathe slowly and easily.  It can be helpful to keep track of a log of your progress. Your caregiver can provide you with a simple table to help with this. If you are using the spirometer at home, follow these instructions: [place] IF:   You are having difficultly using the spirometer.  You have trouble using the spirometer as often as instructed.  Your pain medication is not giving enough relief while using the spirometer.  You develop fever of 100.5 F (38.1 C) or higher. SEEK IMMEDIATE MEDICAL CARE IF:   You cough up bloody sputum that had not been present before.  You develop fever of 102 F (38.9 C) or greater.  You develop worsening pain at or near the incision site. MAKE SURE YOU:   Understand these instructions.  Will watch your condition.  Will get help right away if you are not doing well or get worse. Document Released: 04/09/2007 Document Revised: 02/19/2012 Document Reviewed: 06/10/2007 [facility] Patient Information 2014 [place], [place].   ________________________________________________________________________

## 2020-10-27 ENCOUNTER — Encounter (HOSPITAL_COMMUNITY): Payer: Self-pay

## 2020-10-27 ENCOUNTER — Encounter (HOSPITAL_COMMUNITY)
Admission: RE | Admit: 2020-10-27 | Discharge: 2020-10-27 | Disposition: A | Discharge status: Home / Self Care | Payer: 59 | Source: Ambulatory Visit | Attending: Orthopedic Surgery | Admitting: Orthopedic Surgery

## 2020-10-27 ENCOUNTER — Other Ambulatory Visit: Payer: Self-pay

## 2020-10-27 DIAGNOSIS — Z01818 Encounter for other preprocedural examination: Secondary | ICD-10-CM | POA: Diagnosis present

## 2020-10-27 HISTORY — DX: Unspecified viral hepatitis C without hepatic coma: B19.20

## 2020-10-27 HISTORY — DX: Unspecified osteoarthritis, unspecified site: M19.90

## 2020-10-27 LAB — BASIC METABOLIC PANEL
Anion gap: 7 (ref 5–15)
BUN: 12 mg/dL (ref 8–23)
CO2: 28 mmol/L (ref 22–32)
Calcium: 9.2 mg/dL (ref 8.9–10.3)
Chloride: 105 mmol/L (ref 98–111)
Creatinine, Ser: 0.98 mg/dL (ref 0.61–1.24)
GFR, Estimated: >60 mL/min (ref >60)
Glucose, Bld: 90 mg/dL (ref 70–99)
Potassium: 3.7 mmol/L (ref 3.5–5.1)
Sodium: 140 mmol/L (ref 135–145)

## 2020-10-27 LAB — CBC
HCT: 36.6 % — ABNORMAL LOW (ref 39.0–52.0)
Hemoglobin: 12.4 g/dL — ABNORMAL LOW (ref 13.0–17.0)
MCH: 32.3 pg (ref 26.0–34.0)
MCHC: 33.9 g/dL (ref 30.0–36.0)
MCV: 95.3 fL (ref 80.0–100.0)
Platelets: 141 10*3/uL — ABNORMAL LOW (ref 150–400)
RBC: 3.84 MIL/uL — ABNORMAL LOW (ref 4.22–5.81)
RDW: 12.9 % (ref 11.5–15.5)
WBC: 5.5 10*3/uL (ref 4.0–10.5)
nRBC: 0 % (ref 0.0–0.2)

## 2020-10-27 LAB — SURGICAL PCR SCREEN
MRSA, PCR: NEGATIVE
Staphylococcus aureus: NEGATIVE

## 2020-10-27 NOTE — Progress Notes (Signed)
COVID Vaccine Completed: Yes Date COVID Vaccine completed: 04/2020 COVID vaccine manufacturer:   [employer]   PCP - Aggie Hacker: PA. LOV:10/2020 Cardiologist -   Chest x-ray -  EKG -  Stress Test -  ECHO -  Cardiac Cath -  Pacemaker/ICD device last checked:  Sleep Study -  CPAP -   Fasting Blood Sugar -  Checks Blood Sugar _____ times a day  Blood Thinner Instructions: Aspirin Instructions: Last Dose:  Anesthesia review: Hx: HTN.  Patient denies shortness of breath, fever, cough and chest pain at PAT appointment   Patient verbalized understanding of instructions that were given to them at the PAT appointment. Patient was also instructed that they will need to review over the PAT instructions again at home before surgery.

## 2020-10-29 NOTE — H&P (Signed)
HIP ARTHROPLASTY ADMISSION H&P  Patient ID: Evan [name] MRN: [id number] DOB/AGE: 1956/01/07 64 y.o.  Chief Complaint: right hip pain.  Planned Procedure Date: 11/09/20 Medical Clearance by Georgianne Fick     HPI: Evan [name] is a 64 y.o. male who presents for evaluation of OA RIGHT HIP. The patient has a history of pain and functional disability in the right hip due to arthritis and has failed non-surgical conservative treatments for greater than 12 weeks to include NSAID's and/or analgesics, corticosteriod injections, flexibility and strengthening excercises and activity modification.  Onset of symptoms was gradual, starting 6 years ago with gradually worsening course since that time. The patient noted no past surgery on the right hip.  Patient currently rates pain at 6 out of 10 with activity. Patient has night pain, worsening of pain with activity and weight bearing, pain that interferes with activities of daily living and pain with passive range of motion.  Patient has evidence of periarticular osteophytes and joint space narrowing by imaging studies.  There is no active infection.  Past Medical History:  Diagnosis Date  . Arthritis   . Colon cancer ([place])    colon  . Hepatitis C   . Hypertension    Past Surgical History:  Procedure Laterality Date  . COLON SURGERY    . REPLACEMENT TOTAL KNEE    . SHOULDER ARTHROSCOPY    . WRIST SURGERY Left    No Known Allergies Prior to Admission medications   Medication Sig Start Date End Date Taking? Authorizing Provider  HYDROcodone-acetaminophen (NORCO) 5-325 MG tablet Take 1 tablet by mouth daily as needed for moderate pain. Patient taking differently: Take 1 tablet by mouth every 6 (six) hours as needed for moderate pain.  01/20/16  Yes Patel, Domenick Bookbinder, MD  losartan-hydrochlorothiazide (HYZAAR) 50-12.5 MG tablet Take 1 tablet by mouth daily. 09/17/20  Yes [provider]  naproxen (NAPROSYN) 500 MG tablet Take 500 mg by  mouth 2 (two) times daily with a meal.   Yes [provider]  BYSTOLIC 5 MG tablet Take 5 mg by mouth daily. Patient not taking: Reported on 10/21/2020 10/05/19   [provider]   Social History   Socioeconomic History  . Marital status: Married    Spouse name: Not on file  . Number of children: 7  . Years of education: Not on file  . Highest education level: Not on file  Occupational History  . Occupation: employed  Tobacco Use  . Smoking status: Current Every Day Smoker    Packs/day: 0.50    Types: Cigarettes    Last attempt to quit: 11/19/2005    Years since quitting: 14.9  . Smokeless tobacco: Never Used  Vaping Use  . Vaping Use: Never used  Substance and Sexual Activity  . Alcohol use: Yes    Comment: occasional beer  . Drug use: Yes    Types: Marijuana  . Sexual activity: Yes  Other Topics Concern  . Not on file  Social History Narrative  . Not on file   Social Determinants of Health   Financial Resource Strain:   . Difficulty of Paying Living Expenses: Not on file  Food Insecurity:   . Worried About Charity fundraiser in the Last Year: Not on file  . Ran Out of Food in the Last Year: Not on file  Transportation Needs:   . Lack of Transportation (Medical): Not on file  . Lack of Transportation (Non-Medical): Not on file  Physical  Activity:   . Days of Exercise per Week: Not on file  . Minutes of Exercise per Session: Not on file  Stress:   . Feeling of Stress : Not on file  Social Connections:   . Frequency of Communication with Friends and Family: Not on file  . Frequency of Social Gatherings with Friends and Family: Not on file  . Attends Religious Services: Not on file  . Active Member of Clubs or Organizations: Not on file  . Attends Archivist Meetings: Not on file  . Marital Status: Not on file   Family History  Problem Relation Age of Onset  . Heart disease Mother   . Dementia Father   . Heart disease Sister   .  Cancer Brother   . Hypertension Brother   . Hypertension Brother   . Healthy Son   . Healthy Son   . Healthy Son   . Healthy Son   . Healthy Daughter   . Healthy Daughter   . Healthy Daughter     ROS: Currently denies lightheadedness, dizziness, Fever, chills, CP, SOB.   No personal history of DVT, PE, MI, or CVA. Pt. Does have loose teeth or dentures All other systems have been reviewed and were otherwise currently negative with the exception of those mentioned in the HPI and as above.  Objective: Vitals: Ht: 71 Wt: 194.2 lbs Temp: 98.1 BP: 139/87 Pulse: 55 O2 99% on room air.   Physical Exam: General: Alert, NAD. Trendelenberg Gait  HEENT: EOMI, Trachea is midline, Head is AT/[place] Pulm: No increased work of breathing.  Clear B/L A/P w/o crackle or wheeze.  CV: RRR, No m/g/r appreciated  GI: soft, NT, ND Neuro: Neuro without gross focal deficit.  Sensation intact distally Skin: No lesions in the area of chief complaint MSK/Surgical Site: Right Hip pain with passive ROM.  Positive Stinchfield.  5/5 strength.  NVI.    Imaging Review Plain radiographs demonstrate severe degenerative joint disease of the right hip.   The bone quality appears to be fair for age and reported activity level.  Preoperative templating of the joint replacement has been completed, documented, and submitted to the Operating Room personnel in order to optimize intra-operative equipment management.  Assessment: OA RIGHT HIP Active Problems:   * No active hospital problems. *   Plan: Plan for Procedure(s): TOTAL HIP ARTHROPLASTY ANTERIOR APPROACH  The patient history, physical exam, clinical judgement of the provider and imaging are consistent with end stage degenerative joint disease and total joint arthroplasty is deemed medically necessary. The treatment options including medical management, injection therapy, and arthroplasty were discussed at length. The risks and benefits of Procedure(s): TOTAL  HIP ARTHROPLASTY ANTERIOR APPROACH were presented and reviewed.  The risks of nonoperative treatment, versus surgical intervention including but not limited to continued pain, aseptic loosening, stiffness, dislocation/subluxation, infection, bleeding, nerve injury, blood clots, cardiopulmonary complications, morbidity, mortality, among others were discussed. The patient verbalizes understanding and wishes to proceed with the plan.  Patient is being admitted for surgery, pain control, PT, prophylactic antibiotics, VTE prophylaxis, progressive ambulation, ADL's and discharge planning.   Dental prophylaxis discussed and recommended for 2 years postoperatively.   The patient does meet the criteria for TXA which will be used perioperatively.    ASA 81 mg BID  will be used postoperatively for DVT prophylaxis in addition to SCDs, and early ambulation.  Plan for Oxycodone, Celebrex, Gabapentin for pain.  Robaxin for spasm.  Omeprazole for gastric protection.  The  patient is planning to be discharged home with OPPT in care of his wife Laren Boom, Hershal Coria 10/29/2020 7:53 AM

## 2020-11-05 ENCOUNTER — Other Ambulatory Visit (HOSPITAL_COMMUNITY)
Admission: RE | Admit: 2020-11-05 | Discharge: 2020-11-05 | Disposition: A | Discharge status: Home / Self Care | Payer: 59 | Source: Ambulatory Visit | Attending: Orthopedic Surgery | Admitting: Orthopedic Surgery

## 2020-11-05 DIAGNOSIS — Z20822 Contact with and (suspected) exposure to covid-19: Secondary | ICD-10-CM | POA: Diagnosis not present

## 2020-11-05 DIAGNOSIS — Z01812 Encounter for preprocedural laboratory examination: Secondary | ICD-10-CM | POA: Diagnosis present

## 2020-11-05 LAB — SARS CORONAVIRUS 2 (TAT 6-24 HRS): SARS Coronavirus 2: NEGATIVE

## 2020-11-09 ENCOUNTER — Ambulatory Visit (HOSPITAL_COMMUNITY): Payer: 59 | Admitting: Certified Registered Nurse Anesthetist

## 2020-11-09 ENCOUNTER — Ambulatory Visit (HOSPITAL_COMMUNITY): Payer: 59

## 2020-11-09 ENCOUNTER — Encounter (HOSPITAL_COMMUNITY)
Admission: RE | Disposition: A | Discharge status: Home / Self Care | Payer: Self-pay | Source: Other Acute Inpatient Hospital | Attending: Orthopedic Surgery

## 2020-11-09 ENCOUNTER — Other Ambulatory Visit: Payer: Self-pay

## 2020-11-09 ENCOUNTER — Observation Stay (HOSPITAL_COMMUNITY)
Admission: RE | Admit: 2020-11-09 | Discharge: 2020-11-10 | Disposition: A | Discharge status: Home / Self Care | Payer: 59 | Source: Other Acute Inpatient Hospital | Attending: Orthopedic Surgery | Admitting: Orthopedic Surgery

## 2020-11-09 ENCOUNTER — Encounter (HOSPITAL_COMMUNITY): Payer: Self-pay | Admitting: Orthopedic Surgery

## 2020-11-09 DIAGNOSIS — M1611 Unilateral primary osteoarthritis, right hip: Secondary | ICD-10-CM | POA: Diagnosis not present

## 2020-11-09 DIAGNOSIS — Z96649 Presence of unspecified artificial hip joint: Secondary | ICD-10-CM

## 2020-11-09 DIAGNOSIS — F17210 Nicotine dependence, cigarettes, uncomplicated: Secondary | ICD-10-CM | POA: Diagnosis not present

## 2020-11-09 DIAGNOSIS — I10 Essential (primary) hypertension: Secondary | ICD-10-CM | POA: Diagnosis not present

## 2020-11-09 DIAGNOSIS — Z79899 Other long term (current) drug therapy: Secondary | ICD-10-CM | POA: Diagnosis not present

## 2020-11-09 DIAGNOSIS — Z85038 Personal history of other malignant neoplasm of large intestine: Secondary | ICD-10-CM | POA: Diagnosis not present

## 2020-11-09 DIAGNOSIS — M25551 Pain in right hip: Secondary | ICD-10-CM | POA: Diagnosis present

## 2020-11-09 DIAGNOSIS — S72041A Displaced fracture of base of neck of right femur, initial encounter for closed fracture: Secondary | ICD-10-CM

## 2020-11-09 HISTORY — PX: TOTAL HIP ARTHROPLASTY: SHX124

## 2020-11-09 SURGERY — ARTHROPLASTY, HIP, TOTAL, ANTERIOR APPROACH
Anesthesia: Spinal | Site: Hip | Laterality: Right

## 2020-11-09 MED ORDER — ASPIRIN 81 MG PO CHEW
81.0000 mg | CHEWABLE_TABLET | Freq: Two times a day (BID) | ORAL | Status: DC
  Administered 2020-11-09 – 2020-11-10 (×2): 81 mg via ORAL
  Filled 2020-11-09 (×2): qty 1

## 2020-11-09 MED ORDER — ASPIRIN EC 81 MG PO TBEC: 81.0000 mg | DELAYED_RELEASE_TABLET | Freq: Two times a day (BID) | ORAL | Status: DC

## 2020-11-09 MED ORDER — BUPIVACAINE LIPOSOME 1.3 % IJ SUSP
10.0000 mL | Freq: Once | INTRAMUSCULAR | Status: AC
  Administered 2020-11-09: 10 mL
  Filled 2020-11-09: qty 10

## 2020-11-09 MED ORDER — METHOCARBAMOL 500 MG IVPB - SIMPLE MED
500.0000 mg | Freq: Four times a day (QID) | INTRAVENOUS | Status: DC | PRN
  Filled 2020-11-09: qty 50

## 2020-11-09 MED ORDER — BUPIVACAINE IN DEXTROSE 0.75-8.25 % IT SOLN
INTRATHECAL | Status: DC | PRN
  Administered 2020-11-09: 13.5 mg via INTRATHECAL

## 2020-11-09 MED ORDER — LACTATED RINGERS IV BOLUS: 250.0000 mL | Freq: Once | INTRAVENOUS | Status: DC

## 2020-11-09 MED ORDER — CHLORHEXIDINE GLUCONATE 0.12 % MT SOLN
15.0000 mL | Freq: Once | OROMUCOSAL | Status: AC
  Administered 2020-11-09: 15 mL via OROMUCOSAL

## 2020-11-09 MED ORDER — METHOCARBAMOL 500 MG PO TABS: 500.0000 mg | ORAL_TABLET | Freq: Four times a day (QID) | ORAL | Status: DC | PRN

## 2020-11-09 MED ORDER — TRANEXAMIC ACID-NACL 1000-0.7 MG/100ML-% IV SOLN
1000.0000 mg | Freq: Once | INTRAVENOUS | Status: AC
  Administered 2020-11-09: 1000 mg via INTRAVENOUS
  Filled 2020-11-09: qty 100

## 2020-11-09 MED ORDER — FENTANYL CITRATE (PF) 100 MCG/2ML IJ SOLN
INTRAMUSCULAR | Status: DC | PRN
  Administered 2020-11-09: 50 ug via INTRAVENOUS

## 2020-11-09 MED ORDER — OXYCODONE HCL 5 MG PO TABS: 10.0000 mg | ORAL_TABLET | ORAL | Status: DC | PRN

## 2020-11-09 MED ORDER — PHENYLEPHRINE HCL (PRESSORS) 10 MG/ML IV SOLN
INTRAVENOUS | Status: AC
  Filled 2020-11-09: qty 1

## 2020-11-09 MED ORDER — METOCLOPRAMIDE HCL 5 MG/ML IJ SOLN: 5.0000 mg | Freq: Three times a day (TID) | INTRAMUSCULAR | Status: DC | PRN

## 2020-11-09 MED ORDER — PROPOFOL 500 MG/50ML IV EMUL
INTRAVENOUS | Status: DC | PRN
  Administered 2020-11-09: 75 ug/kg/min via INTRAVENOUS

## 2020-11-09 MED ORDER — DEXAMETHASONE SODIUM PHOSPHATE 10 MG/ML IJ SOLN
INTRAMUSCULAR | Status: DC | PRN
  Administered 2020-11-09: 10 mg via INTRAVENOUS

## 2020-11-09 MED ORDER — PROMETHAZINE HCL 25 MG/ML IJ SOLN: 6.2500 mg | INTRAMUSCULAR | Status: DC | PRN

## 2020-11-09 MED ORDER — CEFAZOLIN SODIUM-DEXTROSE 2-4 GM/100ML-% IV SOLN
2.0000 g | INTRAVENOUS | Status: AC
  Administered 2020-11-09: 2 g via INTRAVENOUS
  Filled 2020-11-09: qty 100

## 2020-11-09 MED ORDER — HYDROMORPHONE HCL 1 MG/ML IJ SOLN: 0.5000 mg | INTRAMUSCULAR | Status: DC | PRN

## 2020-11-09 MED ORDER — LACTATED RINGERS IV SOLN: INTRAVENOUS | Status: DC

## 2020-11-09 MED ORDER — FENTANYL CITRATE (PF) 100 MCG/2ML IJ SOLN: 25.0000 ug | INTRAMUSCULAR | Status: DC | PRN

## 2020-11-09 MED ORDER — ONDANSETRON HCL 4 MG PO TABS: 4.0000 mg | ORAL_TABLET | Freq: Four times a day (QID) | ORAL | Status: DC | PRN

## 2020-11-09 MED ORDER — PANTOPRAZOLE SODIUM 40 MG PO TBEC: 40.0000 mg | DELAYED_RELEASE_TABLET | Freq: Every day | ORAL | 0 refills | Status: DC

## 2020-11-09 MED ORDER — PROPOFOL 10 MG/ML IV BOLUS
INTRAVENOUS | Status: AC
  Filled 2020-11-09: qty 20

## 2020-11-09 MED ORDER — BISACODYL 10 MG RE SUPP: 10.0000 mg | Freq: Every day | RECTAL | Status: DC | PRN

## 2020-11-09 MED ORDER — TRANEXAMIC ACID-NACL 1000-0.7 MG/100ML-% IV SOLN: 1000.0000 mg | Freq: Once | INTRAVENOUS | Status: DC

## 2020-11-09 MED ORDER — WATER FOR IRRIGATION, STERILE IR SOLN
Status: DC | PRN
  Administered 2020-11-09: 2000 mL

## 2020-11-09 MED ORDER — LACTATED RINGERS IV BOLUS
500.0000 mL | Freq: Once | INTRAVENOUS | Status: AC
  Administered 2020-11-09: 500 mL via INTRAVENOUS

## 2020-11-09 MED ORDER — MENTHOL 3 MG MT LOZG: 1.0000 | LOZENGE | OROMUCOSAL | Status: DC | PRN

## 2020-11-09 MED ORDER — CELECOXIB 200 MG PO CAPS: 200.0000 mg | ORAL_CAPSULE | Freq: Two times a day (BID) | ORAL | Status: DC

## 2020-11-09 MED ORDER — OXYCODONE HCL 5 MG PO TABS: 5.0000 mg | ORAL_TABLET | Freq: Once | ORAL | Status: DC | PRN

## 2020-11-09 MED ORDER — EPHEDRINE 5 MG/ML INJ
INTRAVENOUS | Status: AC
  Filled 2020-11-09: qty 10

## 2020-11-09 MED ORDER — OXYCODONE HCL 5 MG/5ML PO SOLN: 5.0000 mg | Freq: Once | ORAL | Status: DC | PRN

## 2020-11-09 MED ORDER — PHENYLEPHRINE HCL-NACL 10-0.9 MG/250ML-% IV SOLN
INTRAVENOUS | Status: DC | PRN
  Administered 2020-11-09: 60 ug/min via INTRAVENOUS

## 2020-11-09 MED ORDER — DIPHENHYDRAMINE HCL 12.5 MG/5ML PO ELIX: 12.5000 mg | ORAL_SOLUTION | ORAL | Status: DC | PRN

## 2020-11-09 MED ORDER — FENTANYL CITRATE (PF) 100 MCG/2ML IJ SOLN
INTRAMUSCULAR | Status: AC
  Filled 2020-11-09: qty 2

## 2020-11-09 MED ORDER — SENNOSIDES-DOCUSATE SODIUM 8.6-50 MG PO TABS
1.0000 | ORAL_TABLET | Freq: Every evening | ORAL | Status: DC | PRN
Start: 2020-11-09 — End: 2020-11-09

## 2020-11-09 MED ORDER — GLYCOPYRROLATE PF 0.2 MG/ML IJ SOSY
PREFILLED_SYRINGE | INTRAMUSCULAR | Status: AC
  Filled 2020-11-09: qty 1

## 2020-11-09 MED ORDER — OXYCODONE HCL 5 MG PO TABS
10.0000 mg | ORAL_TABLET | ORAL | Status: DC | PRN
  Administered 2020-11-09: 10 mg via ORAL
  Filled 2020-11-09: qty 2

## 2020-11-09 MED ORDER — SODIUM CHLORIDE FLUSH 0.9 % IV SOLN
INTRAVENOUS | Status: DC | PRN
  Administered 2020-11-09: 20 mL via INTRAVENOUS

## 2020-11-09 MED ORDER — GLYCOPYRROLATE PF 0.2 MG/ML IJ SOSY
PREFILLED_SYRINGE | INTRAMUSCULAR | Status: DC | PRN
  Administered 2020-11-09: .2 mg via INTRAVENOUS

## 2020-11-09 MED ORDER — ACETAMINOPHEN 325 MG PO TABS: 325.0000 mg | ORAL_TABLET | Freq: Four times a day (QID) | ORAL | Status: DC | PRN

## 2020-11-09 MED ORDER — OXYCODONE HCL 5 MG PO TABS
5.0000 mg | ORAL_TABLET | ORAL | Status: DC | PRN
  Administered 2020-11-09: 5 mg via ORAL
  Administered 2020-11-10: 10 mg via ORAL
  Filled 2020-11-09: qty 1
  Filled 2020-11-09: qty 2

## 2020-11-09 MED ORDER — OXYCODONE HCL 5 MG PO TABS: 5.0000 mg | ORAL_TABLET | ORAL | Status: DC | PRN

## 2020-11-09 MED ORDER — METHOCARBAMOL 500 MG IVPB - SIMPLE MED: 500.0000 mg | Freq: Four times a day (QID) | INTRAVENOUS | Status: DC | PRN

## 2020-11-09 MED ORDER — ACETAMINOPHEN 500 MG PO TABS
1000.0000 mg | ORAL_TABLET | Freq: Once | ORAL | Status: AC
  Administered 2020-11-09: 1000 mg via ORAL
  Filled 2020-11-09: qty 2

## 2020-11-09 MED ORDER — HYDROMORPHONE HCL 1 MG/ML IJ SOLN
0.5000 mg | INTRAMUSCULAR | Status: DC | PRN
  Administered 2020-11-09 – 2020-11-10 (×3): 1 mg via INTRAVENOUS
  Filled 2020-11-09 (×3): qty 1

## 2020-11-09 MED ORDER — CEFAZOLIN SODIUM-DEXTROSE 2-4 GM/100ML-% IV SOLN
2.0000 g | Freq: Four times a day (QID) | INTRAVENOUS | Status: AC
  Administered 2020-11-09 – 2020-11-10 (×2): 2 g via INTRAVENOUS
  Filled 2020-11-09 (×2): qty 100

## 2020-11-09 MED ORDER — ASPIRIN EC 81 MG PO TBEC: 81.0000 mg | DELAYED_RELEASE_TABLET | Freq: Two times a day (BID) | ORAL | 0 refills | Status: AC

## 2020-11-09 MED ORDER — CELECOXIB 200 MG PO CAPS: 200.0000 mg | ORAL_CAPSULE | Freq: Two times a day (BID) | ORAL | 0 refills | Status: AC

## 2020-11-09 MED ORDER — TRANEXAMIC ACID-NACL 1000-0.7 MG/100ML-% IV SOLN
1000.0000 mg | INTRAVENOUS | Status: AC
  Administered 2020-11-09: 1000 mg via INTRAVENOUS
  Filled 2020-11-09: qty 100

## 2020-11-09 MED ORDER — METOCLOPRAMIDE HCL 5 MG PO TABS: 5.0000 mg | ORAL_TABLET | Freq: Three times a day (TID) | ORAL | Status: DC | PRN

## 2020-11-09 MED ORDER — ONDANSETRON HCL 4 MG/2ML IJ SOLN: 4.0000 mg | Freq: Four times a day (QID) | INTRAMUSCULAR | Status: DC | PRN

## 2020-11-09 MED ORDER — METHOCARBAMOL 500 MG PO TABS: 500.0000 mg | ORAL_TABLET | Freq: Three times a day (TID) | ORAL | 0 refills | Status: DC | PRN

## 2020-11-09 MED ORDER — PROPOFOL 500 MG/50ML IV EMUL
INTRAVENOUS | Status: DC | PRN
  Administered 2020-11-09: 40 mg via INTRAVENOUS

## 2020-11-09 MED ORDER — DEXAMETHASONE SODIUM PHOSPHATE 10 MG/ML IJ SOLN
INTRAMUSCULAR | Status: AC
  Filled 2020-11-09: qty 1

## 2020-11-09 MED ORDER — CELECOXIB 200 MG PO CAPS
200.0000 mg | ORAL_CAPSULE | Freq: Two times a day (BID) | ORAL | Status: DC
  Administered 2020-11-09 – 2020-11-10 (×2): 200 mg via ORAL
  Filled 2020-11-09 (×2): qty 1

## 2020-11-09 MED ORDER — SODIUM CHLORIDE (PF) 0.9 % IJ SOLN
INTRAMUSCULAR | Status: AC
  Filled 2020-11-09: qty 20

## 2020-11-09 MED ORDER — MIDAZOLAM HCL 5 MG/5ML IJ SOLN
INTRAMUSCULAR | Status: DC | PRN
  Administered 2020-11-09: 2 mg via INTRAVENOUS

## 2020-11-09 MED ORDER — MIDAZOLAM HCL 2 MG/2ML IJ SOLN
INTRAMUSCULAR | Status: AC
  Filled 2020-11-09: qty 2

## 2020-11-09 MED ORDER — ONDANSETRON HCL 4 MG/2ML IJ SOLN
INTRAMUSCULAR | Status: DC | PRN
  Administered 2020-11-09: 4 mg via INTRAVENOUS

## 2020-11-09 MED ORDER — OXYCODONE HCL 5 MG PO TABS
5.0000 mg | ORAL_TABLET | Freq: Four times a day (QID) | ORAL | 0 refills | Status: AC | PRN
Start: 2020-11-09 — End: 2020-11-14

## 2020-11-09 MED ORDER — SENNOSIDES-DOCUSATE SODIUM 8.6-50 MG PO TABS: 1.0000 | ORAL_TABLET | Freq: Every evening | ORAL | Status: DC | PRN

## 2020-11-09 MED ORDER — ORAL CARE MOUTH RINSE: 15.0000 mL | Freq: Once | OROMUCOSAL | Status: AC

## 2020-11-09 MED ORDER — METHOCARBAMOL 500 MG PO TABS
500.0000 mg | ORAL_TABLET | Freq: Four times a day (QID) | ORAL | Status: DC | PRN
  Administered 2020-11-09: 500 mg via ORAL
  Filled 2020-11-09: qty 1

## 2020-11-09 MED ORDER — ONDANSETRON HCL 4 MG PO TABS: 4.0000 mg | ORAL_TABLET | Freq: Every day | ORAL | 1 refills | Status: AC | PRN

## 2020-11-09 MED ORDER — CEFAZOLIN SODIUM-DEXTROSE 2-4 GM/100ML-% IV SOLN: 2.0000 g | Freq: Four times a day (QID) | INTRAVENOUS | Status: DC

## 2020-11-09 MED ORDER — ONDANSETRON HCL 4 MG/2ML IJ SOLN
INTRAMUSCULAR | Status: AC
  Filled 2020-11-09: qty 2

## 2020-11-09 MED ORDER — 0.9 % SODIUM CHLORIDE (POUR BTL) OPTIME
TOPICAL | Status: DC | PRN
  Administered 2020-11-09: 1000 mL

## 2020-11-09 MED ORDER — PHENOL 1.4 % MT LIQD: 1.0000 | OROMUCOSAL | Status: DC | PRN

## 2020-11-09 MED ORDER — EPHEDRINE SULFATE-NACL 50-0.9 MG/10ML-% IV SOSY
PREFILLED_SYRINGE | INTRAVENOUS | Status: DC | PRN
  Administered 2020-11-09: 10 mg via INTRAVENOUS

## 2020-11-09 MED ORDER — DOCUSATE SODIUM 100 MG PO CAPS: 100.0000 mg | ORAL_CAPSULE | Freq: Two times a day (BID) | ORAL | Status: DC

## 2020-11-09 MED ORDER — PANTOPRAZOLE SODIUM 40 MG PO TBEC
40.0000 mg | DELAYED_RELEASE_TABLET | Freq: Every day | ORAL | Status: DC
  Administered 2020-11-09 – 2020-11-10 (×2): 40 mg via ORAL
  Filled 2020-11-09 (×2): qty 1

## 2020-11-09 MED ORDER — DOCUSATE SODIUM 100 MG PO CAPS
100.0000 mg | ORAL_CAPSULE | Freq: Two times a day (BID) | ORAL | Status: DC
  Administered 2020-11-09 – 2020-11-10 (×2): 100 mg via ORAL
  Filled 2020-11-09 (×2): qty 1

## 2020-11-09 SURGICAL SUPPLY — 45 items
APL PRP STRL LF DISP 70% ISPRP (MISCELLANEOUS) ×1
BAG SPEC THK2 15X12 ZIP CLS (MISCELLANEOUS)
BAG ZIPLOCK 12X15 (MISCELLANEOUS) IMPLANT
BLADE SAG 18X100X1.27 (BLADE) ×2 IMPLANT
BLADE SURG SZ10 CARB STEEL (BLADE) IMPLANT
CHLORAPREP W/TINT 26 (MISCELLANEOUS) ×2 IMPLANT
CLSR STERI-STRIP ANTIMIC 1/2X4 (GAUZE/BANDAGES/DRESSINGS) ×2 IMPLANT
COVER PERINEAL POST (MISCELLANEOUS) ×2 IMPLANT
COVER SURGICAL LIGHT HANDLE (MISCELLANEOUS) ×2 IMPLANT
COVER WAND RF STERILE (DRAPES) IMPLANT
DECANTER SPIKE VIAL GLASS SM (MISCELLANEOUS) ×4 IMPLANT
DRAPE IMP U-DRAPE 54X76 (DRAPES) ×2 IMPLANT
DRAPE STERI IOBAN 125X83 (DRAPES) ×2 IMPLANT
DRAPE U-SHAPE 47X51 STRL (DRAPES) ×4 IMPLANT
DRSG MEPILEX BORDER 4X8 (GAUZE/BANDAGES/DRESSINGS) ×2 IMPLANT
ELECT REM PT RETURN 15FT ADLT (MISCELLANEOUS) ×2 IMPLANT
GLOVE BIO SURGEON STRL SZ7.5 (GLOVE) ×4 IMPLANT
GLOVE BIOGEL PI IND STRL 7.5 (GLOVE) ×1 IMPLANT
GLOVE BIOGEL PI IND STRL 8 (GLOVE) ×1 IMPLANT
GLOVE BIOGEL PI INDICATOR 7.5 (GLOVE) ×1
GLOVE BIOGEL PI INDICATOR 8 (GLOVE) ×1
GOWN STRL REUS W/TWL LRG LVL3 (GOWN DISPOSABLE) ×2 IMPLANT
GOWN STRL REUS W/TWL XL LVL3 (GOWN DISPOSABLE) ×2 IMPLANT
HEAD BIOLOX HIP 36/-5 (Joint) IMPLANT
HIP BIOLOX HD 36/-5 (Joint) ×2 IMPLANT
HOLDER FOLEY CATH W/STRAP (MISCELLANEOUS) IMPLANT
INSERT TRIDENT POLY 36 0DEG (Insert) ×1 IMPLANT
KIT TURNOVER KIT A (KITS) IMPLANT
MANIFOLD NEPTUNE II (INSTRUMENTS) ×2 IMPLANT
NS IRRIG 1000ML POUR BTL (IV SOLUTION) ×2 IMPLANT
PACK ANTERIOR HIP CUSTOM (KITS) ×2 IMPLANT
PROTECTOR NERVE ULNAR (MISCELLANEOUS) ×2 IMPLANT
SCREW HEX LP 6.5X20 (Screw) ×1 IMPLANT
SHELL ACETABUL CLUSTER SZ 54 (Shell) ×1 IMPLANT
STEM 37MM HIP (Hips) ×1 IMPLANT
SUT MNCRL AB 3-0 PS2 18 (SUTURE) ×2 IMPLANT
SUT STRATAFIX 0 PDS 27 VIOLET (SUTURE) ×2
SUT VIC AB 0 CT1 36 (SUTURE) ×2 IMPLANT
SUT VIC AB 1 CT1 36 (SUTURE) ×2 IMPLANT
SUT VIC AB 2-0 CT1 27 (SUTURE) ×4
SUT VIC AB 2-0 CT1 TAPERPNT 27 (SUTURE) ×2 IMPLANT
SUTURE STRATFX 0 PDS 27 VIOLET (SUTURE) ×1 IMPLANT
TRAY FOLEY MTR SLVR 16FR STAT (SET/KITS/TRAYS/PACK) IMPLANT
TUBE SUCTION HIGH CAP CLEAR NV (SUCTIONS) ×2 IMPLANT
WATER STERILE IRR 1000ML POUR (IV SOLUTION) ×4 IMPLANT

## 2020-11-09 NOTE — Progress Notes (Signed)
PT Cancellation Note  Patient Details Name: Evan [name] MRN: [id number] DOB: 1956/10/15   Cancelled Treatment:    Reason Eval/Treat Not Completed: Other (comment);Patient not medically ready (Patient unable to activate bil LE's with 1/5 or less throughout. Patient will need to stay overnight and acute PT will re-assess tomorrow as pt able and schedule allows.)   Verner Mould, DPT Acute Rehabilitation Services  Office [phone] Pager [phone]  11/09/2020 6:03 PM

## 2020-11-09 NOTE — Anesthesia Procedure Notes (Addendum)
Spinal  Patient location during procedure: OR Start time: 11/09/2020 1:37 PM Staffing Performed: resident/CRNA  Resident/CRNA: Rosaland Lao, CRNA Preanesthetic Checklist Completed: patient identified, IV checked, site marked, risks and benefits discussed, surgical consent, monitors and equipment checked, pre-op evaluation and timeout performed Spinal Block Patient position: sitting Prep: DuraPrep Patient monitoring: heart rate, cardiac monitor, continuous pulse ox and blood pressure Approach: midline Location: L3-4 Injection technique: single-shot Needle Needle type: Pencan  Needle gauge: 24 G Needle length: 10 cm Needle insertion depth: 6 cm Assessment Sensory level: T4

## 2020-11-09 NOTE — Progress Notes (Signed)
Margy Clarks P.A. called and informed of patient's spinal level and patient need to remain overnight for PT assessment tomorrow

## 2020-11-09 NOTE — Op Note (Signed)
11/09/2020  2:48 PM  PATIENT:  Evan [name]   MRN: [id number]  PRE-OPERATIVE DIAGNOSIS:  OA RIGHT HIP  POST-OPERATIVE DIAGNOSIS:  OA RIGHT HIP  PROCEDURE:  Procedure(s): TOTAL HIP ARTHROPLASTY ANTERIOR APPROACH  PREOPERATIVE INDICATIONS:    Evan [name] is an 64 y.o. male who has a diagnosis of <principal problem not specified> and elected for surgical management after failing conservative treatment.  The risks benefits and alternatives were discussed with the patient including but not limited to the risks of nonoperative treatment, versus surgical intervention including infection, bleeding, nerve injury, periprosthetic fracture, the need for revision surgery, dislocation, leg length discrepancy, blood clots, cardiopulmonary complications, morbidity, mortality, among others, and they were willing to proceed.     OPERATIVE REPORT     SURGEON:   Renette Butters, MD    ASSISTANT:  Margy Clarks, PA-C, he was present and scrubbed throughout the case, critical for completion in a timely fashion, and for retraction, instrumentation, and closure.     ANESTHESIA:  General    COMPLICATIONS:  None.     COMPONENTS:  Stryker acolade fit femur size 7 with a 36 mm -5 head ball and an acetabular shell size 54 with a  polyethylene liner    PROCEDURE IN DETAIL:   The patient was met in the holding area and  identified.  The appropriate hip was identified and marked at the operative site.  The patient was then transported to the OR  and  placed under anesthesia per that record.  At that point, the patient was  placed in the supine position and  secured to the operating room table and all bony prominences padded. He received pre-operative antibiotics    The operative lower extremity was prepped from the iliac crest to the distal leg.  Sterile draping was performed.  Time out was performed prior to incision.      Skin incision was made just 2 cm lateral to the ASIS  extending in line  with the tensor fascia lata. Electrocautery was used to control all bleeders. I dissected down sharply to the fascia of the tensor fascia lata was confirmed that the muscle fibers beneath were running posteriorly. I then incised the fascia over the superficial tensor fascia lata in line with the incision. The fascia was elevated off the anterior aspect of the muscle the muscle was retracted posteriorly and protected throughout the case. I then used electrocautery to incise the tensor fascia lata fascia control and all bleeders. Immediately visible was the fat over top of the anterior neck and capsule.  I removed the anterior fat from the capsule and elevated the rectus muscle off of the anterior capsule. I then removed a large time of capsule. The retractors were then placed over the anterior acetabulum as well as around the superior and inferior neck.  I then made a femoral neck cut. Then used the power corkscrew to remove the femoral head from the acetabulum and thoroughly irrigated the acetabulum. I sized the femoral head.    I then exposed the deep acetabulum, cleared out any tissue including the ligamentum teres.   After adequate visualization, I excised the labrum, and then sequentially reamed.  I then impacted the acetabular implant into place using fluoroscopy for guidance.  Appropriate version and inclination was confirmed clinically matching their bony anatomy, and with fluoroscopy.  I placed a 20 mm screw in the posterior/superio position with an excellent bite.    I then placed the polyethylene liner in  place  I then adducted the leg and released the external rotators from the posterior femur allowing it to be easily delivered up lateral and anterior to the acetabulum for preparation of the femoral canal.    I then prepared the proximal femur using the cookie-cutter and then sequentially reamed and broached.  A trial broach, neck, and head was utilized, and I reduced the hip and used  floroscopy to assess the neck length and femoral implant.  I then impacted the femoral prosthesis into place into the appropriate version. The hip was then reduced and fluoroscopy confirmed appropriate position. Leg lengths were restored.  I then irrigated the hip copiously again with, and repaired the fascia with Vicryl, followed by monocryl for the subcutaneous tissue, Monocryl for the skin, Steri-Strips and sterile gauze. The patient was then awakened and returned to PACU in stable and satisfactory condition. There were no complications.  POST OPERATIVE PLAN: WBAT, DVT px: SCD's/TED, ambulation and chemical dvt px  Edmonia Lynch, MD Orthopedic Surgeon [phone]

## 2020-11-09 NOTE — Anesthesia Postprocedure Evaluation (Signed)
Anesthesia Post Note  Patient: Evan [name]  Procedure(s) Performed: TOTAL HIP ARTHROPLASTY ANTERIOR APPROACH (Right Hip)     Patient location during evaluation: Nursing Unit Anesthesia Type: Spinal Level of consciousness: oriented and awake and alert Pain management: pain level controlled Vital Signs Assessment: post-procedure vital signs reviewed and stable Respiratory status: spontaneous breathing and respiratory function stable Cardiovascular status: blood pressure returned to baseline and stable Postop Assessment: no headache, no backache, no apparent nausea or vomiting and patient able to bend at knees Anesthetic complications: no   No complications documented.  Last Vitals:  Vitals:   11/09/20 1545 11/09/20 1600  BP: 113/77 114/81  Pulse: 68 67  Resp: 15 13  Temp:    SpO2: 100% 100%    Last Pain:  Vitals:   11/09/20 1600  TempSrc:   PainSc: 0-No pain                 Barnet Glasgow

## 2020-11-09 NOTE — Anesthesia Preprocedure Evaluation (Addendum)
Anesthesia Evaluation  Patient identified by MRN, date of birth, ID band Patient awake    Reviewed: Allergy & Precautions, NPO status , Patient's Chart, lab work & pertinent test results  Airway Mallampati: II  TM Distance: >3 FB Neck ROM: Full    Dental  (+) Upper Dentures, Dental Advisory Given   Pulmonary Current Smoker,    Pulmonary exam normal breath sounds clear to auscultation       Cardiovascular hypertension, Normal cardiovascular exam Rhythm:Regular Rate:Normal     Neuro/Psych negative neurological ROS  negative psych ROS   GI/Hepatic (+)     substance abuse  marijuana use, Hepatitis -, C  Endo/Other    Renal/GU   negative genitourinary   Musculoskeletal  (+) Arthritis ,   Abdominal Normal abdominal exam  (+)   Peds negative pediatric ROS (+)  Hematology   Anesthesia Other Findings Colon cancer  Reproductive/Obstetrics                            Anesthesia Physical Anesthesia Plan  ASA: III  Anesthesia Plan: Spinal   Post-op Pain Management:    Induction: Intravenous  PONV Risk Score and Plan: 0 and Propofol infusion, TIVA and Treatment may vary due to age or medical condition  Airway Management Planned: Natural Airway and Simple Face Mask  Additional Equipment:   Intra-op Plan:   Post-operative Plan:   Informed Consent: I have reviewed the patients History and Physical, chart, labs and discussed the procedure including the risks, benefits and alternatives for the proposed anesthesia with the patient or authorized representative who has indicated his/her understanding and acceptance.     Dental advisory given  Plan Discussed with: CRNA and Anesthesiologist  Anesthesia Plan Comments:        Anesthesia Quick Evaluation

## 2020-11-09 NOTE — Transfer of Care (Signed)
Immediate Anesthesia Transfer of Care Note  Patient: Evan [name]  Procedure(s) Performed: TOTAL HIP ARTHROPLASTY ANTERIOR APPROACH (Right Hip)  Patient Location: PACU  Anesthesia Type:Spinal  Level of Consciousness: awake, alert  and oriented  Airway & Oxygen Therapy: Patient Spontanous Breathing and Patient connected to face mask  Post-op Assessment: Report given to RN and Post -op Vital signs reviewed and stable  Post vital signs: Reviewed and stable  Last Vitals:  Vitals Value Taken Time  BP 117/80 11/09/20 1540  Temp    Pulse 67 11/09/20 1542  Resp 15 11/09/20 1542  SpO2 100 % 11/09/20 1542  Vitals shown include unvalidated device data.  Last Pain:  Vitals:   11/09/20 1102  TempSrc:   PainSc: 7       Patients Stated Pain Goal: 5 (48/54/62 7035)  Complications: No complications documented.

## 2020-11-09 NOTE — Interval H&P Note (Signed)
History and Physical Interval Note:  11/09/2020 10:24 AM  Evan [name]  has presented today for surgery, with the diagnosis of OA RIGHT HIP.  The various methods of treatment have been discussed with the patient and family. After consideration of risks, benefits and other options for treatment, the patient has consented to  Procedure(s): TOTAL HIP ARTHROPLASTY ANTERIOR APPROACH (Right) as a surgical intervention.  The patient's history has been reviewed, patient examined, no change in status, stable for surgery.  I have reviewed the patient's chart and labs.  Questions were answered to the patient's satisfaction.     Renette Butters

## 2020-11-09 NOTE — Discharge Instructions (Signed)
You may bear weight as tolerated. Keep your dressing on and dry until follow up. Take medicine to prevent blood clots as directed. Take pain medicine as needed with the goal of transitioning to over the counter medicines.  If needed, you may increase breakthrough pain medication (oxycodone) for the first few days post op - up to 2 tablets every 4 hours.  Stop this medication as soon as you are able.  INSTRUCTIONS AFTER JOINT REPLACEMENT   o Remove items at home which could result in a fall. This includes throw rugs or furniture in walking pathways o ICE to the affected joint every three hours while awake for 30 minutes at a time, for at least the first 3-5 days, and then as needed for pain and swelling.  Continue to use ice for pain and swelling. You may notice swelling that will progress down to the foot and ankle.  This is normal after surgery.  Elevate your leg when you are not up walking on it.   o Continue to use the breathing machine you got in the hospital (incentive spirometer) which will help keep your temperature down.  It is common for your temperature to cycle up and down following surgery, especially at night when you are not up moving around and exerting yourself.  The breathing machine keeps your lungs expanded and your temperature down.   DIET:  As you were doing prior to hospitalization, we recommend a well-balanced diet.  DRESSING / WOUND CARE / SHOWERING  You may shower 3 days after surgery, but keep the wounds dry during showering.  You may use an occlusive plastic wrap (Press'n Seal for example) with blue painter's tape at edges, NO SOAKING/SUBMERGING IN THE BATHTUB.  If the bandage gets wet, change with a clean dry gauze.  If the incision gets wet, pat the wound dry with a clean towel.  ACTIVITY  o Increase activity slowly as tolerated, but follow the weight bearing instructions below.   o No driving for 6 weeks or until further direction given by your physician.  You  cannot drive while taking narcotics.  o No lifting or carrying greater than 10 lbs. until further directed by your surgeon. o Avoid periods of inactivity such as sitting longer than an hour when not asleep. This helps prevent blood clots.  o You may return to work once you are authorized by your doctor.     WEIGHT BEARING   Weight bearing as tolerated with assist device (walker, cane, etc) as directed, use it as long as suggested by your surgeon or therapist, typically at least 4-6 weeks.   EXERCISES  Results after joint replacement surgery are often greatly improved when you follow the exercise, range of motion and muscle strengthening exercises prescribed by your doctor. Safety measures are also important to protect the joint from further injury. Any time any of these exercises cause you to have increased pain or swelling, decrease what you are doing until you are comfortable again and then slowly increase them. If you have problems or questions, call your caregiver or physical therapist for advice.   Rehabilitation is important following a joint replacement. After just a few days of immobilization, the muscles of the leg can become weakened and shrink (atrophy).  These exercises are designed to build up the tone and strength of the thigh and leg muscles and to improve motion. Often times heat used for twenty to thirty minutes before working out will loosen up your tissues and help with  improving the range of motion but do not use heat for the first two weeks following surgery (sometimes heat can increase post-operative swelling).   These exercises can be done on a training (exercise) mat, on the floor, on a table or on a bed. Use whatever works the best and is most comfortable for you.    Use music or television while you are exercising so that the exercises are a pleasant break in your day. This will make your life better with the exercises acting as a break in your routine that you can look  forward to.   Perform all exercises about fifteen times, three times per day or as directed.  You should exercise both the operative leg and the other leg as well.  Exercises include:   . Quad Sets - Tighten up the muscle on the front of the thigh (Quad) and hold for 5-10 seconds.   . Straight Leg Raises - With your knee straight (if you were given a brace, keep it on), lift the leg to 60 degrees, hold for 3 seconds, and slowly lower the leg.  Perform this exercise against resistance later as your leg gets stronger.  . Leg Slides: Lying on your back, slowly slide your foot toward your buttocks, bending your knee up off the floor (only go as far as is comfortable). Then slowly slide your foot back down until your leg is flat on the floor again.  G[place]ard Haring Wings: Lying on your back spread your legs to the side as far apart as you can without causing discomfort.  . Hamstring Strength:  Lying on your back, push your heel against the floor with your leg straight by tightening up the muscles of your buttocks.  Repeat, but this time bend your knee to a comfortable angle, and push your heel against the floor.  You may put a pillow under the heel to make it more comfortable if necessary.   A rehabilitation program following joint replacement surgery can speed recovery and prevent re-injury in the future due to weakened muscles. Contact your doctor or a physical therapist for more information on knee rehabilitation.    CONSTIPATION  Constipation is defined medically as fewer than three stools per week and severe constipation as less than one stool per week.  Even if you have a regular bowel pattern at home, your normal regimen is likely to be disrupted due to multiple reasons following surgery.  Combination of anesthesia, postoperative narcotics, change in appetite and fluid intake all can affect your bowels.   YOU MUST use at least one of the following options; they are listed in order of increasing strength  to get the job done.  They are all available over the counter, and you may need to use some, POSSIBLY even all of these options:    Drink plenty of fluids (prune juice may be helpful) and high fiber foods Colace 100 mg by mouth twice a day  Senokot for constipation as directed and as needed Dulcolax (bisacodyl), take with full glass of water  Miralax (polyethylene glycol) once or twice a day as needed.  If you have tried all these things and are unable to have a bowel movement in the first 3-4 days after surgery call either your surgeon or your primary doctor.    If you experience loose stools or diarrhea, hold the medications until you stool forms back up.  If your symptoms do not get better within 1 week or if they get worse,  check with your doctor.  If you experience "the worst abdominal pain ever" or develop nausea or vomiting, please contact the office immediately for further recommendations for treatment.   ITCHING:  If you experience itching with your medications, try taking only a single pain pill, or even half a pain pill at a time.  You can also use Benadryl over the counter for itching or also to help with sleep.   TED HOSE STOCKINGS:  Use stockings on both legs until for at least 2 weeks or as directed by physician office. They may be removed at night for sleeping.  MEDICATIONS:  See your medication summary on the "After Visit Summary" that nursing will review with you.  You may have some home medications which will be placed on hold until you complete the course of blood thinner medication.  It is important for you to complete the blood thinner medication as prescribed.  PRECAUTIONS:  If you experience chest pain or shortness of breath - call 911 immediately for transfer to the hospital emergency department.   If you develop a fever greater that 101 F, purulent drainage from wound, increased redness or drainage from wound, foul odor from the wound/dressing, or calf pain - CONTACT  YOUR SURGEON.                                                   FOLLOW-UP APPOINTMENTS:  If you do not already have a post-op appointment, please call the office for an appointment to be seen by your surgeon.  Guidelines for how soon to be seen are listed in your "After Visit Summary", but are typically between 1-4 weeks after surgery.  OTHER INSTRUCTIONS:  Dental Antibiotics:  In most cases prophylactic antibiotics for Dental procdeures after total joint surgery are not necessary.  Exceptions are as follows:  1. History of prior total joint infection  2. Severely immunocompromised (Organ Transplant, cancer chemotherapy, Rheumatoid biologic meds such as [place])  3. Poorly controlled diabetes (A1C &gt; 8.0, blood glucose over 200)  If you have one of these conditions, contact your surgeon for an antibiotic prescription, prior to your dental procedure.   MAKE SURE YOU:  . Understand these instructions.  . Get help right away if you are not doing well or get worse.    Thank you for letting us be a part of your medical care team.  It is a privilege we respect greatly.  We hope these instructions will help you stay on track for a fast and full recovery!

## 2020-11-10 ENCOUNTER — Encounter (HOSPITAL_COMMUNITY): Payer: Self-pay | Admitting: Orthopedic Surgery

## 2020-11-10 DIAGNOSIS — M1611 Unilateral primary osteoarthritis, right hip: Secondary | ICD-10-CM | POA: Diagnosis not present

## 2020-11-10 NOTE — Plan of Care (Signed)

## 2020-11-10 NOTE — TOC Transition Note (Signed)
Transition of Care [facility]) - CM/SW Discharge Note   Patient Details  Name: Evan [name] MRN: [id number] Date of Birth: 1956/04/02  [facility] CM/SW Contact:  Lia Hopping, [place] Phone Number: 11/10/2020, 1:48 PM   Clinical Narrative:    Therapy Plan: OPPT Patient confirm he has RW and 3 in1    Final next level of care: OP Rehab Barriers to Discharge: No Barriers Identified   Patient Goals and CMS Choice        Discharge Placement                       Discharge Plan and Services                                     Social Determinants of Health (SDOH) Interventions     Readmission Risk Interventions No flowsheet data found.

## 2020-11-10 NOTE — Progress Notes (Signed)
    Subjective: Patient reports pain as mild.  Tolerating diet.  Urinating.  +Flatus.  No CP, SOB.  Not yet OOB.  Objective:   VITALS:   Vitals:   11/09/20 2111 11/09/20 2235 11/10/20 0232 11/10/20 0553  BP: 139/79 (!) 135/59 (!) 153/95 (!) 150/97  Pulse: 67 70 77 82  Resp: 15 17 18 17   Temp: 98 F (36.7 C) 98.5 F (36.9 C) 98.6 F (37 C) 98.3 F (36.8 C)  TempSrc: Oral  Oral Oral  SpO2: 99% 97% 100% 99%  Weight:      Height:       CBC Latest Ref Rng & Units 10/27/2020 12/18/2013 12/09/2013  WBC 4.0 - 10.5 K/uL 5.5 7.8 8.1  Hemoglobin 13.0 - 17.0 g/dL 12.4(L) 14.2 14.4  Hematocrit 39 - 52 % 36.6(L) 42.2 42.7  Platelets 150 - 400 K/uL 141(L) 162.0 165.0   BMP Latest Ref Rng & Units 10/27/2020 12/09/2013 11/19/2013  Glucose 70 - 99 mg/dL 90 134(H) 62(L)  BUN 8 - 23 mg/dL 12 11 10   Creatinine 0.61 - 1.24 mg/dL 0.98 1.0 1.0  Sodium 135 - 145 mmol/L 140 138 140  Potassium 3.5 - 5.1 mmol/L 3.7 3.6 4.0  Chloride 98 - 111 mmol/L 105 104 105  CO2 22 - 32 mmol/L 28 26 29   Calcium 8.9 - 10.3 mg/dL 9.2 8.8 9.1   Intake/Output      11/30 0701 - 12/01 0700 12/01 0701 - 12/02 0700   P.O. 480    I.V. (mL/kg) 1000 (11.3)    IV Piggyback 1000    Total Intake(mL/kg) 2480 (28)    Urine (mL/kg/hr) 1325    Blood 200    Total Output 1525    Net +955            Physical Exam: General: NAD.  Resting in bed, comfortable.  Resp: No increased wob Cardio: regular rate and rhythm ABD soft Neurologically intact MSK Neurovascularly intact Sensation intact distally Intact pulses distally Dorsiflexion/Plantar flexion intact Incision: dressing C/D/I Neurovascular intact Sensation intact distally Dorsiflexion/Plantar flexion intact Compartment soft  Assessment: 1 Day Post-Op  S/P Procedure(s) (LRB): TOTAL HIP ARTHROPLASTY ANTERIOR APPROACH (Right) by Dr. Ernesta Amble. Murphy on 11/08/20  Active Problems:   S/P total hip arthroplasty     Primary osteoarthritis, status post  total hip arthroplasty   Plan: Up with therapy D/C IV fluids Incentive Spirometry Apply ice   Weight Bearing: Weight Bearing as Tolerated (WBAT) RLE Dressings: Maintain Mepilex.   VTE prophylaxis: Aspirin 81mg  BID, SCDs, ambulation Dispo: Home with OPPT   Rachael Fee, PA-C 11/10/2020, 7:42 AM

## 2020-11-10 NOTE — Evaluation (Signed)
Occupational Therapy Evaluation Patient Details Name: Evan [name] MRN: [id number] DOB: 12-26-1955 Today's Date: 11/10/2020    History of Present Illness Patient is S/PRight  total hip arthroplasty   Clinical Impression   [name] is a 64 year old man s/p R THA who presents with mild complaints of hip pain, decreased ROM and strength of RLE limiting his ability to perform independent ambulation and ADLs. Patient able to don underwear and shorts but will need assistance for footwear. Educated patient on use of long handled shoe horn and potential need for sock aide though he states his wife can help him. Patient reports low toilet at home but states he will not need a BSC or riser. He has a counter to push up on as needed. Therapist recommended use of shower chair in tub for safety. Patient verbalized understanding of all education. No further OT needs at this time.    Follow Up Recommendations  No OT follow up    Equipment Recommendations  Tub/shower seat;Other (comment) (long handled shoe horn, sock aide)    Recommendations for Other Services       Precautions / Restrictions Precautions Precautions: Fall Restrictions Weight Bearing Restrictions: No Other Position/Activity Restrictions: WBAT      Mobility Bed Mobility Overal bed mobility: Needs Assistance Bed Mobility: Supine to Sit     Supine to sit: Supervision     General bed mobility comments: increased time and use of bed rail.    Transfers Overall transfer level: Needs assistance Equipment used: Rolling walker (2 wheeled) Transfers: Sit to/from [facility] Sit to Stand: Min guard Stand pivot transfers: Min guard       General transfer comment: min guard to ambulate in room with RW    Balance Overall balance assessment: No apparent balance deficits (not formally assessed)                                         ADL either performed or assessed with  clinical judgement   ADL Overall ADL's : Needs assistance/impaired Eating/Feeding: Independent   Grooming: Independent   Upper Body Bathing: Independent   Lower Body Bathing: Minimal assistance Lower Body Bathing Details (indicate cue type and reason): recommended use of shower chair at home for safety. educated on where to purchase. Upper Body Dressing : Independent   Lower Body Dressing: Minimal assistance;Sitting/lateral leans;Sit to/from stand Lower Body Dressing Details (indicate cue type and reason): Able to donn underwear and shorts by leaning as needed. Reports normally doning clothing in bed. Needed assistance for socks. Recommended use of long handled shoe horn and potentially sock aide if needed at discharge.                     Vision Patient Visual Report: No change from baseline       Perception     Praxis      Pertinent Vitals/Pain Pain Assessment: 0-10 Pain Score: 3  Pain Location: R hip Pain Intervention(s): Monitored during session;Premedicated before session     Hand Dominance Right   Extremity/Trunk Assessment Upper Extremity Assessment Upper Extremity Assessment: Overall WFL for tasks assessed (history of shoulder surgeries bilaterally.)   Lower Extremity Assessment Lower Extremity Assessment: Defer to PT evaluation   Cervical / Trunk Assessment Cervical / Trunk Assessment: Normal   Communication Communication Communication: No difficulties   Cognition Arousal/Alertness: Awake/alert Behavior During  Therapy: WFL for tasks assessed/performed Overall Cognitive Status: Within Functional Limits for tasks assessed                                     General Comments       Exercises     Shoulder Instructions      Home Living Family/patient expects to be discharged to:: Private residence Living Arrangements: Spouse/significant other Available Help at Discharge: Family Type of Home: House Home Access: Stairs to  enter Technical brewer of Steps: 2   Home Layout: One level     Bathroom Shower/Tub: Teacher, early years/pre: [place]: Environmental consultant - 2 wheels          Prior Functioning/Environment Level of Independence: Independent        Comments: Works as a Curator.        OT Problem List:        OT Treatment/Interventions:      OT Goals(Current goals can be found in the care plan section) Acute Rehab OT Goals Patient Stated Goal: To get back to work. OT Goal Formulation: All assessment and education complete, DC therapy  OT Frequency:     Barriers to D/C:            Co-evaluation              AM-PAC OT "6 Clicks" Daily Activity     Outcome Measure Help from another person eating meals?: None Help from another person taking care of personal grooming?: None Help from another person toileting, which includes using toliet, bedpan, or urinal?: A Little Help from another person bathing (including washing, rinsing, drying)?: A Little Help from another person to put on and taking off regular upper body clothing?: None Help from another person to put on and taking off regular lower body clothing?: A Little 6 Click Score: 21   End of Session Equipment Utilized During Treatment: Rolling walker Nurse Communication: Mobility status  Activity Tolerance: Patient tolerated treatment well Patient left: in chair;with chair alarm set  OT Visit Diagnosis: Other abnormalities of gait and mobility (R26.89);Pain Pain - Right/Left: Right Pain - part of body: Hip                Time: 0092-3300 OT Time Calculation (min): 15 min Charges:  OT General Charges $OT Visit: 1 Visit OT Evaluation $OT Eval Low Complexity: 1 Low  Eleanora Guinyard, OTR/L [place] [phone] Pager: [place] 11/10/2020, 9:06 AM

## 2020-11-10 NOTE — Discharge Summary (Signed)
Discharge Summary  Patient ID: Evan [name] MRN: [id number] DOB/AGE: 16-Oct-1956 64 y.o.  Admit date: 11/09/2020 Discharge date: 11/10/2020  Admission Diagnoses:  OA of the Right hip  Discharge Diagnoses:  Active Problems:   S/P total hip arthroplasty   Past Medical History:  Diagnosis Date  . Arthritis   . Colon cancer ([place])    colon  . Hepatitis C   . Hypertension     Surgeries: Procedure(s): TOTAL HIP ARTHROPLASTY ANTERIOR APPROACH on 11/09/2020   Consultants (if any):   Discharged Condition: Improved  Hospital Course: [name] is an 64 y.o. male who was admitted 11/09/2020 with a diagnosis of <principal problem not specified> and went to the operating room on 11/09/2020 and underwent the above named procedures.     He was given perioperative antibiotics:  Anti-infectives (From admission, onward)   Start     Dose/Rate Route Frequency Ordered Stop   11/09/20 2000  ceFAZolin (ANCEF) IVPB 2g/100 mL premix        2 g 200 mL/hr over 30 Minutes Intravenous Every 6 hours 11/09/20 1647 11/10/20 0301   11/09/20 1945  ceFAZolin (ANCEF) IVPB 2g/100 mL premix  Status:  Discontinued        2 g 200 mL/hr over 30 Minutes Intravenous Every 6 hours 11/09/20 1847 11/09/20 1855   11/09/20 1100  ceFAZolin (ANCEF) IVPB 2g/100 mL premix        2 g 200 mL/hr over 30 Minutes Intravenous On call to O.R. 11/09/20 1049 11/09/20 1408    .  He was given sequential compression devices, early ambulation, and ASA for DVT prophylaxis.  He benefited maximally from the hospital stay and there were no complications.    Recent vital signs:  Vitals:   11/10/20 0232 11/10/20 0553  BP: (!) 153/95 (!) 150/97  Pulse: 77 82  Resp: 18 17  Temp: 98.6 F (37 C) 98.3 F (36.8 C)  SpO2: 100% 99%    Recent laboratory studies:  Lab Results  Component Value Date   HGB 12.4 (L) 10/27/2020   HGB 14.2 12/18/2013   HGB 14.4 12/09/2013   Lab Results  Component Value Date   WBC  5.5 10/27/2020   PLT 141 (L) 10/27/2020   No results found for: INR Lab Results  Component Value Date   NA 140 10/27/2020   K 3.7 10/27/2020   CL 105 10/27/2020   CO2 28 10/27/2020   BUN 12 10/27/2020   CREATININE 0.98 10/27/2020   GLUCOSE 90 10/27/2020    Discharge Medications:   Allergies as of 11/10/2020   No Known Allergies     Medication List    STOP taking these medications   Bystolic 5 MG tablet Generic drug: nebivolol   HYDROcodone-acetaminophen 5-325 MG tablet Commonly known as: Norco   naproxen 500 MG tablet Commonly known as: NAPROSYN     TAKE these medications   aspirin EC 81 MG tablet Take 1 tablet (81 mg total) by mouth in the morning and at bedtime. Swallow whole.   celecoxib 200 MG capsule Commonly known as: CeleBREX Take 1 capsule (200 mg total) by mouth 2 (two) times daily for 14 days.   losartan-hydrochlorothiazide 50-12.5 MG tablet Commonly known as: HYZAAR Take 1 tablet by mouth daily.   methocarbamol 500 MG tablet Commonly known as: Robaxin Take 1 tablet (500 mg total) by mouth every 8 (eight) hours as needed for muscle spasms.   ondansetron 4 MG tablet Commonly known as: Zofran Take 1  tablet (4 mg total) by mouth daily as needed for up to 14 days for nausea or vomiting.   oxyCODONE 5 MG immediate release tablet Commonly known as: Roxicodone Take 1 tablet (5 mg total) by mouth every 6 (six) hours as needed for up to 5 days.   pantoprazole 40 MG tablet Commonly known as: Protonix Take 1 tablet (40 mg total) by mouth daily for 14 days.       Diagnostic Studies: DG C-Arm 1-60 Min-No Report  Result Date: 11/09/2020 Fluoroscopy was utilized by the requesting physician.  No radiographic interpretation.   DG HIP OPERATIVE UNILAT W OR W/O PELVIS RIGHT  Result Date: 11/09/2020 CLINICAL DATA:  Right hip replacement EXAM: OPERATIVE RIGHT HIP (WITH PELVIS IF PERFORMED) 2 VIEWS TECHNIQUE: Fluoroscopic spot image(s) were submitted for  interpretation post-operatively. COMPARISON:  None. FINDINGS: Two fluoroscopic images are obtained during the performance of the procedure and are provided for interpretation only. Right hip arthroplasty is identified in expected position without evidence of complication. FLUOROSCOPY TIME:  13 seconds IMPRESSION: 1. Unremarkable right hip arthroplasty. Electronically Signed   By: Randa Ngo M.D.   On: 11/09/2020 15:05    Disposition:  Discharge to home with [place]    Renette Butters, MD In 2 weeks.   Specialty: Orthopedic Surgery Contact information: [address] [place] [address] [phone]                Signed: Rachael Fee PA-C 11/10/2020, 7:45 AM

## 2020-11-10 NOTE — Plan of Care (Signed)
°  Problem: Education: Goal: Knowledge of General Education information will improve Description: Including pain rating scale, medication(s)/side effects and non-pharmacologic comfort measures Outcome: Progressing   Problem: Clinical Measurements: Goal: Will remain free from infection Outcome: Progressing   Problem: Activity: Goal: Risk for activity intolerance will decrease Outcome: Progressing   Problem: Pain Managment: Goal: General experience of comfort will improve Outcome: Progressing

## 2020-11-10 NOTE — Evaluation (Signed)
Physical Therapy Evaluation Patient Details Name: Evan [name] MRN: [id number] DOB: [birth year] Today's Date: 11/10/2020   History of Present Illness  Pt is 64 yo male s/p R anterior THA on 11/09/20.  He has PMH including TKA, colon CA, and HTN.  Clinical Impression  Pt is s/p THA resulting in the deficits listed below (see PT Problem List). Pt presenting with excellent ROM, pain control, and mobility for POD #1.  He was able to demonstrate safe gait and transfers with AD. Required min cues for safety and correct exercise technique.  Pt normally independent, has necessary DME, and family support.  Demonstrates mobility safe to return home from PT perspective but will continue to benefit from acute PT while hospitalized to advance mobility. Pt will benefit from skilled PT to increase their independence and safety with mobility to allow discharge to the venue listed below.      Follow Up Recommendations Follow surgeon's recommendation for DC plan and follow-up therapies;Supervision/Assistance - 24 hour    Equipment Recommendations  None recommended by PT    Recommendations for Other Services       Precautions / Restrictions Precautions Precautions: Fall Restrictions Weight Bearing Restrictions: No Other Position/Activity Restrictions: WBAT      Mobility  Bed Mobility Overal bed mobility: Needs Assistance Bed Mobility: Supine to Sit     Supine to sit: Supervision     General bed mobility comments: increased time and use of bed rail.    Transfers Overall transfer level: Needs assistance Equipment used: None Transfers: Sit to/from Stand Sit to Stand: Supervision Stand pivot transfers: Min guard       General transfer comment: cues to wait for RW  Ambulation/Gait Ambulation/Gait assistance: Supervision Gait Distance (Feet): 300 Feet Assistive device: Rolling walker (2 wheeled);Straight cane Gait Pattern/deviations: Step-through pattern Gait velocity: normal    General Gait Details: Near normal gait pattern with only slight decrease in stance time on R.  Ambulated safely with RW.  Pt request to try cane and ambulated 20' in gym with cane safely.  Advised use of RW at home for first several days to assist with pain control and safety.  Stairs Stairs: Yes Stairs assistance: Min guard Stair Management: No rails;With cane;Step to pattern;Forwards;One rail Right Number of Stairs: 5 General stair comments: Started with rail/cane and progressed to cane only  Wheelchair Mobility    Modified Rankin (Stroke Patients Only)       Balance Overall balance assessment: No apparent balance deficits (not formally assessed)   Sitting balance-Leahy Scale: Normal     Standing balance support: Bilateral upper extremity supported;Single extremity supported;No upper extremity supported Standing balance-Leahy Scale: Good Standing balance comment: Utilized cane or RW for ambulation but was able to static stand and weight shift without UE support                             Pertinent Vitals/Pain Pain Assessment: No/denies pain Pain Score: 3  Pain Location: R hip Pain Intervention(s): Monitored during session;Premedicated before session    Home Living Family/patient expects to be discharged to:: Private residence Living Arrangements: Spouse/significant other Available Help at Discharge: Family;Available 24 hours/day Type of Home: House Home Access: Stairs to enter Entrance Stairs-Rails: None Entrance Stairs-Number of Steps: 2 Home Layout: One level Home Equipment: Walker - 2 wheels;Cane - single point      Prior Function Level of Independence: Independent         Comments: Works  as a Curator.     Hand Dominance   Dominant Hand: Right    Extremity/Trunk Assessment   Upper Extremity Assessment Upper Extremity Assessment: Overall WFL for tasks assessed    Lower Extremity Assessment Lower Extremity Assessment: LLE  deficits/detail;RLE deficits/detail RLE Deficits / Details: ROM : WFL, tends to IR hip with transfers cued to maintain neutral and was able to correct; MMT: ankle 5/5, knee and hip 3/5 not further tested LLE Deficits / Details: WFL    Cervical / Trunk Assessment Cervical / Trunk Assessment: Normal  Communication   Communication: No difficulties  Cognition Arousal/Alertness: Awake/alert Behavior During Therapy: WFL for tasks assessed/performed Overall Cognitive Status: Within Functional Limits for tasks assessed                                 General Comments: pleasant and motivated      General Comments General comments (skin integrity, edema, etc.): VSS; Educated on safety, HEP, RW use, f/u with therapy as prescribed by MD, no pivots, safe ice use    Exercises Total Joint Exercises Ankle Circles/Pumps: AROM;Both;10 reps;Supine Quad Sets: AROM;Both;10 reps;Supine Heel Slides: AROM;Right;10 reps;Supine (cues to limit IR) Long Arc Quad: AROM;Both;10 reps;Seated   Assessment/Plan    PT Assessment Patient needs continued PT services  PT Problem List Decreased strength;Decreased mobility;Decreased range of motion;Decreased activity tolerance;Decreased balance;Decreased knowledge of use of DME       PT Treatment Interventions DME instruction;Therapeutic activities;Modalities;Gait training;Therapeutic exercise;Patient/family education;Stair training;Balance training;Functional mobility training    PT Goals (Current goals can be found in the Care Plan section)  Acute Rehab PT Goals Patient Stated Goal: To get back to work. PT Goal Formulation: With patient Time For Goal Achievement: 11/24/20 Potential to Achieve Goals: Good    Frequency 7X/week   Barriers to discharge        Co-evaluation               AM-PAC PT "6 Clicks" Mobility  Outcome Measure Help needed turning from your back to your side while in a flat bed without using bedrails?: None Help  needed moving from lying on your back to sitting on the side of a flat bed without using bedrails?: None Help needed moving to and from a bed to a chair (including a wheelchair)?: None Help needed standing up from a chair using your arms (e.g., wheelchair or bedside chair)?: None Help needed to walk in hospital room?: None Help needed climbing 3-5 steps with a railing? : A Little 6 Click Score: 23    End of Session Equipment Utilized During Treatment: Gait belt Activity Tolerance: Patient tolerated treatment well Patient left: in bed;with call bell/phone within reach;Other (comment) (safe to ambulate independently if unhooked from IV) Nurse Communication: Mobility status PT Visit Diagnosis: Other abnormalities of gait and mobility (R26.89);Muscle weakness (generalized) (M62.81)    Time: 7412-8786 PT Time Calculation (min) (ACUTE ONLY): 23 min   Charges:   PT Evaluation $PT Eval Low Complexity: 1 Low PT Treatments $Therapeutic Exercise: 8-22 mins        Abran Richard, PT Acute Rehab Services Pager [phone] [facility] [phone]    Karlton Lemon 11/10/2020, 11:24 AM

## 2020-11-11 ENCOUNTER — Encounter (HOSPITAL_COMMUNITY): Payer: Self-pay | Admitting: Physical Therapy

## 2020-11-11 ENCOUNTER — Ambulatory Visit (HOSPITAL_COMMUNITY): Payer: 59 | Attending: Physician Assistant | Admitting: Physical Therapy

## 2020-11-11 ENCOUNTER — Other Ambulatory Visit: Payer: Self-pay

## 2020-11-11 DIAGNOSIS — R262 Difficulty in walking, not elsewhere classified: Secondary | ICD-10-CM | POA: Diagnosis not present

## 2020-11-11 DIAGNOSIS — M6281 Muscle weakness (generalized): Secondary | ICD-10-CM | POA: Insufficient documentation

## 2020-11-11 DIAGNOSIS — R2689 Other abnormalities of gait and mobility: Secondary | ICD-10-CM | POA: Diagnosis present

## 2020-11-11 NOTE — Patient Instructions (Signed)
Access Code: [email or web address] URL: [email or web address] Date: 11/11/2020 Prepared by: Sherlyn Lees  Exercises Supine Heel Slide with Strap - 3 x daily - 7 x weekly - 3 sets - 10 reps - 3 sec hold Supine Hip Abduction - 3 x daily - 7 x weekly - 3 sets - 10 reps

## 2020-11-11 NOTE — Therapy (Addendum)
[place], [place], [address] Phone: [phone]   Fax:  [phone]  Physical Therapy Evaluation  Patient Details  Name: Evan [name] MRN: [id number] Date of Birth: 1956-08-11 Referring Provider (PT): Fredonia Highland, MD   Encounter Date: 11/11/2020   PT End of Session - 11/11/20 1354    Visit Number 1    Number of Visits 12    Date for PT Re-Evaluation 12/23/20    Authorization Type [employer]- visit limit 30, 2 used    Authorization - Visit Number 1    Authorization - Number of Visits 28    Progress Note Due on Visit 10    PT Start Time 1318    PT Stop Time 1350    PT Time Calculation (min) 32 min    Activity Tolerance Patient tolerated treatment well    Behavior During Therapy [facility] for tasks assessed/performed           Past Medical History:  Diagnosis Date  . Arthritis   . Colon cancer ([place])    colon  . Hepatitis C   . Hypertension     Past Surgical History:  Procedure Laterality Date  . COLON SURGERY    . REPLACEMENT TOTAL KNEE    . SHOULDER ARTHROSCOPY    . TOTAL HIP ARTHROPLASTY Right 11/09/2020   Procedure: TOTAL HIP ARTHROPLASTY ANTERIOR APPROACH;  Surgeon: Renette Butters, MD;  Location: [facility];  Service: Orthopedics;  Laterality: Right;  . WRIST SURGERY Left     There were no vitals filed for this visit.    Subjective Assessment - 11/11/20 1337    Subjective R THA Surgery 11/09/20, 5/10 pain along right lateral thigh. Worse with movement. Using RW at this time for ambulation. Currently taking medication for pain. 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 Yes    Pain Score 5     Pain Location Hip    Pain Orientation Right    Pain Descriptors / Indicators Sore     Pain Type Surgical pain    Pain Radiating Towards R lateral thigh    Pain Onset In the past 7 days    Pain Frequency Intermittent              [facility] PT Assessment - 11/11/20 0001      Assessment   Medical Diagnosis R THA    Referring Provider (PT) Fredonia Highland, MD    Onset Date/Surgical Date 11/09/20    Hand Dominance Right      Precautions   Precautions Anterior Hip      Restrictions   Weight Bearing Restrictions Yes    RLE Weight Bearing Weight bearing as tolerated      Balance Screen   Has the patient fallen in the past 6 months No    Is the patient reluctant to leave their home because of a fear of falling?  No      Home Social worker Private residence    Living Arrangements Spouse/significant other    Available Help at Discharge Family    Type of [place] to enter    Entrance Stairs-Number of Steps 2    [place]  One level    Research scientist (life sciences) - 2 wheels;Cane - single point      Prior Function   Level of Independence Independent    Vocation Full time employment    Biochemist, clinical   bending, squat,lifting, climbing ladders     Observation/Other Assessments   Focus on Therapeutic Outcomes (FOTO)  42% function       ROM / Strength   AROM / PROM / Strength AROM;PROM;Strength      AROM   Right Hip Extension --   unable to extend fully in supine   Right Hip Flexion 50    Right Hip ABduction 12   supine     Strength   Left Hip Flexion 3/5   by observation   Left Hip ABduction 3-/5   by observation     Ambulation/Gait   Ambulation/Gait Yes    Ambulation/Gait Assistance 6: Modified independent (Device/Increase time)    Ambulation Distance (Feet) 350 Feet    Assistive device Rolling walker    Gait Pattern Step-through pattern;Decreased hip/knee flexion - right;Decreased weight shift to right;Trunk flexed    Ambulation Surface Level    Gait Comments 2MWT: 350 ft                       Objective measurements completed on examination: See above findings.       [place] Adult PT Treatment/Exercise - 11/11/20 0001      Exercises   Exercises Knee/Hip      Knee/Hip Exercises: Stretches   Other Knee/Hip Stretches pt instructed in supine AAROM hip flexion and abduction 3x10 w/ 5 sec hold                  PT Education - 11/11/20 1339    Education Details patient educated in HEP activities and activity limitations to CLOF and advised to continue w/ use of RW until gait mechanics have improved    Person(s) Educated Patient    Methods Explanation;Handout    Comprehension Verbalized understanding            PT Short Term Goals - 11/11/20 1404      PT SHORT TERM GOAL #1   Title Patient will be independent in HEP activities in order to self-progress program    Baseline In development    Time 3    Period Weeks    Status New    Target Date 12/02/20      PT SHORT TERM GOAL #2   Title Patient will demonstrate RLE ROM WNL    Baseline hip and abduction ROM limited    Time 3    Period Weeks    Status New    Target Date 12/02/20      PT SHORT TERM GOAL #3   Title Patient will demonstrate Independent ambulation with normalized pattern    Baseline Modified independent with RW and gait deviations    Time 3    Period Weeks    Target Date 12/02/20      PT SHORT TERM GOAL #4   Title Patient will ambulate 600 ft during 2MWT without AD    Baseline 350 modified indepedent w RW    Time 3    Period Weeks    Status New    Target Date 12/02/20             PT Long Term Goals - 11/11/20 1411      PT LONG TERM GOAL #1  Title Patient will improve FOTO score by at least 15 points in order to indicate improved tolerance to activity.    Baseline 42%    Time 6    Period Weeks    Status New    Target Date 12/23/20      PT LONG TERM GOAL #2   Title "Pt will be independent with advanced HEP for self-progression of strengthening exercises     Baseline in development    Time 6    Period Weeks    Status New    Target Date 12/23/20      PT LONG TERM GOAL #3   Title Patient will be able to ascend/descend flight of stairs while carrying 10 lbs to prepare for return to work    Baseline unable to attempt    Time 6    Period Weeks    Status New    Target Date 12/23/20                  Plan - 11/11/20 1355    Clinical Impression Statement Patient presents s/p R THA anterior approach and demonstrates gait deviations, decreased ROM, reduced activity tolerance, and new onset of RLE pain following surgery which limits activity and employment participation    Personal Factors and Comorbidities Comorbidity 1    Comorbidities HTN    Examination-Activity Limitations [place];Carry;Lift;Locomotion Level;Squat;Stairs;Stand;Transfers    Examination-Participation Restrictions Community Activity;Driving;Laundry;Occupation    Stability/Clinical Decision Making Stable/Uncomplicated    Clinical Decision Making Low    Rehab Potential Excellent    PT Frequency 2x / week    PT Duration 6 weeks    PT Treatment/Interventions Aquatic Therapy;Cryotherapy;Electrical Stimulation;DME Instruction;Gait training;Stair training;Functional mobility training;Therapeutic activities;Therapeutic exercise;Balance training;Patient/family education;Manual techniques;Passive range of motion;Joint Manipulations    PT Next Visit Plan continue with HEP development and techniques to normalize gait pattern    PT Home Exercise Plan AAROM heel slides, hip abduction           Patient will benefit from skilled therapeutic intervention in order to improve the following deficits and impairments:  Abnormal gait, Decreased activity tolerance, Decreased mobility, Decreased knowledge of use of DME, Decreased endurance, Decreased range of motion, Decreased strength, Difficulty walking, Pain  Visit Diagnosis: Difficulty in walking, not elsewhere classified - Plan: PT plan  of care cert/re-cert  Other abnormalities of gait and mobility - Plan: PT plan of care cert/re-cert  Muscle weakness (generalized) - Plan: PT plan of care cert/re-cert     Problem List Patient Active Problem List   Diagnosis Date Noted  . 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 Right leg pain 12/08/2015  . Right thigh pain 12/08/2015  . Osteoarthrosis, unspecified whether generalized or localized, involving lower leg 08/17/2015  . Chronic back pain 08/17/2015   3:39 PM, 11/11/20 Jerene Pitch, DPT Physical Therapy with [facility]  [phone] office  [place] [address] [place], [place], [address] Phone: [phone]   Fax:  [phone]  Name: Evan [name] MRN: [id number] Date of Birth: July 11, 1956

## 2020-11-15 ENCOUNTER — Ambulatory Visit (HOSPITAL_COMMUNITY): Payer: 59 | Admitting: Physical Therapy

## 2020-11-17 ENCOUNTER — Encounter (HOSPITAL_COMMUNITY): Payer: Self-pay | Admitting: Physical Therapy

## 2020-11-17 ENCOUNTER — Other Ambulatory Visit: Payer: Self-pay

## 2020-11-17 ENCOUNTER — Ambulatory Visit (HOSPITAL_COMMUNITY): Payer: 59 | Admitting: Physical Therapy

## 2020-11-17 DIAGNOSIS — R262 Difficulty in walking, not elsewhere classified: Secondary | ICD-10-CM

## 2020-11-17 DIAGNOSIS — R2689 Other abnormalities of gait and mobility: Secondary | ICD-10-CM

## 2020-11-17 DIAGNOSIS — M6281 Muscle weakness (generalized): Secondary | ICD-10-CM

## 2020-11-17 NOTE — Patient Instructions (Signed)
Access Code: [email or web address] URL: [email or web address] Date: 11/17/2020 Prepared by: Mitzi Hansen Kynadie Yaun  Exercises Sit to Stand with Arms Crossed - 1 x daily - 7 x weekly - 2 sets - 10 reps Beginner Bridge - 1 x daily - 7 x weekly - 3 sets - 10 reps Active Straight Leg Raise with Quad Set - 1 x daily - 7 x weekly - 2 sets - 10 reps

## 2020-11-17 NOTE — Therapy (Signed)
Evan [name], [place], [address] Phone: [phone]   Fax:  [phone]  Physical Therapy Treatment  Patient Details  Name: Evan [name] MRN: [id number] Date of Birth: January 27, 1956 Referring Provider (PT): Evan Highland, MD   Encounter Date: 11/17/2020   PT End of Session - 11/17/20 1359    Visit Number 2    Number of Visits 12    Date for PT Re-Evaluation 12/23/20    Authorization Type [employer]- visit limit 30, 2 used    Authorization - Visit Number 2    Authorization - Number of Visits 28    Progress Note Due on Visit 10    PT Start Time 1400    PT Stop Time 1440    PT Time Calculation (min) 40 min    Activity Tolerance Patient tolerated treatment well    Behavior During Therapy [facility] for tasks assessed/performed           Past Medical History:  Diagnosis Date  . Arthritis   . Colon cancer ([place])    colon  . Hepatitis C   . Hypertension     Past Surgical History:  Procedure Laterality Date  . COLON SURGERY    . REPLACEMENT TOTAL KNEE    . SHOULDER ARTHROSCOPY    . TOTAL HIP ARTHROPLASTY Right 11/09/2020   Procedure: TOTAL HIP ARTHROPLASTY ANTERIOR APPROACH;  Surgeon: Renette Butters, MD;  Location: [facility];  Service: Orthopedics;  Laterality: Right;  . WRIST SURGERY Left     There were no vitals filed for this visit.   Subjective Assessment - 11/17/20 1400    Subjective He has been walking with cane. He is still having pain on the side of his leg. His home exercises are going well.    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 Yes    Pain Score 3     Pain Location Hip    Pain Orientation Right    Pain Descriptors / Indicators Sore    Pain Type Surgical pain    Pain Onset In the past 7 days                             [facility] Adult PT Treatment/Exercise - 11/17/20 0001       Knee/Hip Exercises: Standing   Heel Raises Both;1 set;20 reps    Hip Flexion Both;2 sets;10 reps    Hip Flexion Limitations marching    Forward Step Up Right;15 reps;Hand Hold: 1;Step Height: 6";2 sets    Functional Squat 1 set;10 reps    Functional Squat Limitations at counter with manual cueing for reducing dynamic knee valgus on R      Knee/Hip Exercises: Seated   Sit to Sand 10 reps   2 sets     Knee/Hip Exercises: Supine   Bridges Both;2 sets;10 reps    Straight Leg Raises Left;2 sets;10 reps    Other Supine Knee/Hip Exercises glute sets 5x 5 second holds                  PT Education - 11/17/20 1400    Education Details Patient educated on HEP, exercise mechanics, gait mechanics with SPC    Person(s) Educated Patient    Methods Explanation;Demonstration;Handout    Comprehension Verbalized understanding;Returned demonstration  PT Short Term Goals - 11/11/20 1404      PT SHORT TERM GOAL #1   Title Patient will be independent in HEP activities in order to self-progress program    Baseline In development    Time 3    Period Weeks    Status New    Target Date 12/02/20      PT SHORT TERM GOAL #2   Title Patient will demonstrate RLE ROM WNL    Baseline hip and abduction ROM limited    Time 3    Period Weeks    Status New    Target Date 12/02/20      PT SHORT TERM GOAL #3   Title Patient will demonstrate Independent ambulation with normalized pattern    Baseline Modified independent with RW and gait deviations    Time 3    Period Weeks    Target Date 12/02/20      PT SHORT TERM GOAL #4   Title Patient will ambulate 600 ft during 2MWT without AD    Baseline 350 modified indepedent w RW    Time 3    Period Weeks    Status New    Target Date 12/02/20             PT Long Term Goals - 11/11/20 1411      PT LONG TERM GOAL #1   Title Patient will improve FOTO score by at least 15 points in order to indicate improved tolerance to  activity.    Baseline 42%    Time 6    Period Weeks    Status New    Target Date 12/23/20      PT LONG TERM GOAL #2   Title "Pt will be independent with advanced HEP for self-progression of strengthening exercises    Baseline in development    Time 6    Period Weeks    Status New    Target Date 12/23/20      PT LONG TERM GOAL #3   Title Patient will be able to ascend/descend flight of stairs while carrying 10 lbs to prepare for return to work    Baseline unable to attempt    Time 6    Period Weeks    Status New    Target Date 12/23/20                 Plan - 11/17/20 1359    Clinical Impression Statement Patient completes glute sets initially and transitions to bridges with good mechanics. Patient has no difficulty with supine marching so progressed to SLR with cueing for prior quad set. Patient given cueing for glute activation with marching and to decrease height of hip flexion if painful. Patient showing dynamic knee valgus on RLE secondary to R hip weakness. Patient given manual, verbal, and tactile cueing for improved glute activation to reduce valgus. Patient requires unilateral UE support with step up secondary to impaired dynamic balance while completing. Patient will continue to benefit from skilled physical therapy in order to reduce impairment and improve function.    Personal Factors and Comorbidities Comorbidity 1    Comorbidities HTN    Examination-Activity Limitations Bend;Carry;Lift;Locomotion Level;Squat;Stairs;Stand;Transfers    Examination-Participation Restrictions Community Activity;Driving;Laundry;Occupation    Stability/Clinical Decision Making Stable/Uncomplicated    Rehab Potential Excellent    PT Frequency 2x / week    PT Duration 6 weeks    PT Treatment/Interventions Aquatic Therapy;Cryotherapy;Electrical Stimulation;DME Instruction;Gait training;Stair training;Functional mobility training;Therapeutic activities;Therapeutic exercise;Balance  training;Patient/family  education;Manual techniques;Passive range of motion;Joint Manipulations    PT Next Visit Plan continue with HEP development and techniques to normalize gait pattern    PT Home Exercise Plan AAROM heel slides, hip abduction; 12/8 bridge STS, SLR           Patient will benefit from skilled therapeutic intervention in order to improve the following deficits and impairments:  Abnormal gait, Decreased activity tolerance, Decreased mobility, Decreased knowledge of use of DME, Decreased endurance, Decreased range of motion, Decreased strength, Difficulty walking, Pain  Visit Diagnosis: Difficulty in walking, not elsewhere classified  Other abnormalities of gait and mobility  Muscle weakness (generalized)     Problem List Patient Active Problem List   Diagnosis Date Noted  . 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 Right leg pain 12/08/2015  . Right thigh pain 12/08/2015  . Osteoarthrosis, unspecified whether generalized or localized, involving lower leg 08/17/2015  . Chronic back pain 08/17/2015    2:42 PM, 11/17/20 Mearl Latin PT, DPT Physical Therapist at [place], [place], [address] Phone: [phone]   Fax:  [phone]  Name: Evan [name] MRN: [id number] Date of Birth: 04-19-1956

## 2020-11-19 ENCOUNTER — Telehealth (HOSPITAL_COMMUNITY): Payer: Self-pay | Admitting: Physical Therapy

## 2020-11-19 ENCOUNTER — Ambulatory Visit (HOSPITAL_COMMUNITY): Payer: 59 | Admitting: Physical Therapy

## 2020-11-19 NOTE — Telephone Encounter (Signed)
pt cancelled appt for today because his ride could not bring him

## 2020-11-22 ENCOUNTER — Ambulatory Visit (HOSPITAL_COMMUNITY): Payer: 59 | Admitting: Physical Therapy

## 2020-11-24 ENCOUNTER — Other Ambulatory Visit: Payer: Self-pay

## 2020-11-24 ENCOUNTER — Ambulatory Visit (HOSPITAL_COMMUNITY): Payer: 59 | Admitting: Physical Therapy

## 2020-11-24 DIAGNOSIS — R2689 Other abnormalities of gait and mobility: Secondary | ICD-10-CM

## 2020-11-24 DIAGNOSIS — R262 Difficulty in walking, not elsewhere classified: Secondary | ICD-10-CM

## 2020-11-24 DIAGNOSIS — M6281 Muscle weakness (generalized): Secondary | ICD-10-CM

## 2020-11-24 NOTE — Therapy (Addendum)
[place], [place], [address] Phone: [phone]   Fax:  [phone]  Physical Therapy Treatment and Discharge note   Patient Details  Name: Evan [name] MRN: [id number] Date of Birth: 1956/01/10 Referring Provider (PT): Fredonia Highland, MD  PHYSICAL THERAPY DISCHARGE SUMMARY  Visits from Start of Care: 3  Current functional level related to goals / functional outcomes: Unable to assess secondary to unplanned discharge   Remaining deficits: Unable to assess secondary to unplanned discharge   Education / Equipment: Unable to assess secondary to unplanned discharge Plan: Patient agrees to discharge.  Patient goals were not met. Patient is being discharged due to the patient's request.  ?????     9:23 AM, 12/02/20 Jerene Pitch, DPT Physical Therapy with [facility]  [phone] office   Encounter Date: 11/24/2020   PT End of Session - 11/24/20 1450    Visit Number 3    Number of Visits 12    Date for PT Re-Evaluation 12/23/20    Authorization Type [employer]- visit limit 30, 2 used    Authorization - Visit Number 3    Authorization - Number of Visits 28    Progress Note Due on Visit 10    PT Start Time 1400    PT Stop Time 1438    PT Time Calculation (min) 38 min    Activity Tolerance Patient tolerated treatment well    Behavior During Therapy WFL for tasks assessed/performed           Past Medical History:  Diagnosis Date  . Arthritis   . Colon cancer ([place])    colon  . Hepatitis C   . Hypertension     Past Surgical History:  Procedure Laterality Date  . COLON SURGERY    . REPLACEMENT TOTAL KNEE    . SHOULDER ARTHROSCOPY    . TOTAL HIP ARTHROPLASTY Right 11/09/2020   Procedure: TOTAL HIP ARTHROPLASTY ANTERIOR APPROACH;  Surgeon: Renette Butters, MD;  Location: [facility];  Service: Orthopedics;  Laterality: Right;  . WRIST SURGERY Left     There were no  vitals filed for this visit.   Subjective Assessment - 11/24/20 1405    Subjective pt states he returns to [facility] today to get his stitches out.  No pain or issues.  Compliant with HEP.    Currently in Pain? No/denies                             [facility] Adult PT Treatment/Exercise - 11/24/20 0001      Knee/Hip Exercises: Standing   Heel Raises 20 reps    Hip Flexion Both;2 sets;15 reps    Hip Flexion Limitations marching    Hip Abduction Both;15 reps    Hip Extension Both;15 reps    Lateral Step Up Both;15 reps;Step Height: 6";Hand Hold: 1    Forward Step Up Right;15 reps;Hand Hold: 1;Step Height: 6";2 sets    Step Down Both;15 reps;Step Height: 4";Hand Hold: 1    Stairs 7" steps 3RT reciprcally no issues    SLS with Vectors 10X5" each LE                    PT Short Term Goals - 11/24/20 1536      PT SHORT TERM GOAL #1   Title Patient will be independent in HEP activities in order to self-progress program  Baseline In development    Time 3    Period Weeks    Status Achieved    Target Date 12/02/20      PT SHORT TERM GOAL #2   Title Patient will demonstrate RLE ROM WNL    Baseline hip and abduction ROM limited    Time 3    Period Weeks    Status Achieved    Target Date 12/02/20      PT SHORT TERM GOAL #3   Title Patient will demonstrate Independent ambulation with normalized pattern    Baseline Modified independent with RW and gait deviations    Time 3    Period Weeks    Status Achieved    Target Date 12/02/20      PT SHORT TERM GOAL #4   Title Patient will ambulate 600 ft during 2MWT without AD    Baseline 350 modified indepedent w RW    Time 3    Period Weeks    Status On-going    Target Date 12/02/20             PT Long Term Goals - 11/24/20 1537      PT LONG TERM GOAL #1   Title Patient will improve FOTO score by at least 15 points in order to indicate improved tolerance to activity.    Baseline 42%    Time 6    Period  Weeks    Status On-going      PT LONG TERM GOAL #2   Title "Pt will be independent with advanced HEP for self-progression of strengthening exercises    Baseline in development    Time 6    Period Weeks    Status On-going      PT LONG TERM GOAL #3   Title Patient will be able to ascend/descend flight of stairs while carrying 10 lbs to prepare for return to work    Baseline unable to attempt    Time 6    Period Weeks    Status On-going                 Plan - 11/24/20 1534    Clinical Impression Statement Continued with established therex.  Pt without any pain or discomfort today and reports overall improvement since beginning therapy.  Added forward step downs, lateral step ups, hip abduction/extension and vectors to help improve hip and LE strength and stability.  Pt able to negotiate 7 inch stairs without UE assist reciprocally without gait deviations or any other impairments.  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  Gait without antalgia today.   Instructed to discuss with MD today at appt and may be discharged at any time he feels he has returned to 100%.  Otherwise, will continue PT to address all remaining goals.    Personal Factors and Comorbidities Comorbidity 1    Comorbidities HTN    Examination-Activity Limitations Bend;Carry;Lift;Locomotion Level;Squat;Stairs;Stand;Transfers    Examination-Participation Restrictions Community Activity;Driving;Laundry;Occupation    Stability/Clinical Decision Making Stable/Uncomplicated    Rehab Potential Excellent    PT Frequency 2x / week    PT Duration 6 weeks    PT Treatment/Interventions Aquatic Therapy;Cryotherapy;Electrical Stimulation;DME Instruction;Gait training;Stair training;Functional mobility training;Therapeutic activities;Therapeutic exercise;Balance training;Patient/family education;Manual techniques;Passive range of motion;Joint  Manipulations    PT Next Visit Plan continue with HEP development and techniques to normalize gait pattern    PT Home Exercise Plan AAROM  heel slides, hip abduction; 12/8 bridge STS, SLR           Patient will benefit from skilled therapeutic intervention in order to improve the following deficits and impairments:  Abnormal gait,Decreased activity tolerance,Decreased mobility,Decreased knowledge of use of DME,Decreased endurance,Decreased range of motion,Decreased strength,Difficulty walking,Pain  Visit Diagnosis: Muscle weakness (generalized)  Other abnormalities of gait and mobility  Difficulty in walking, not elsewhere classified     Problem List Patient Active Problem List   Diagnosis Date Noted  . 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 Right leg pain 12/08/2015  . Right thigh pain 12/08/2015  . Osteoarthrosis, unspecified whether generalized or localized, involving lower leg 08/17/2015  . Chronic back pain 08/17/2015   Teena Irani, PTA/CLT [phone]  Teena Irani 11/24/2020, 3:39 PM  [facility] [address] [place], [place], [address] Phone: [phone]   Fax:  [phone]  Name: Evan [name] MRN: [id number] Date of Birth: 1956/11/07

## 2020-11-26 ENCOUNTER — Ambulatory Visit (HOSPITAL_COMMUNITY): Payer: 59

## 2020-11-26 ENCOUNTER — Telehealth (HOSPITAL_COMMUNITY): Payer: Self-pay

## 2020-11-26 NOTE — Telephone Encounter (Signed)
No show, called and talked to pt.  Pt stated he forgot to call.  Went to [facility] apt following last apt and ready for DC.   Plan to cancel rest of apts and send note to evaluation therapist.    Ihor Austin, LPTA/CLT; Delana Meyer [phone]

## 2020-11-29 ENCOUNTER — Encounter (HOSPITAL_COMMUNITY): Payer: 59 | Admitting: Physical Therapy

## 2020-12-01 ENCOUNTER — Ambulatory Visit (HOSPITAL_COMMUNITY): Payer: 59 | Admitting: Physical Therapy

## 2020-12-07 ENCOUNTER — Emergency Department
Admit: 2020-12-07 | Discharge: 2020-12-07 | Disposition: A | Discharge status: Home / Self Care | Payer: PRIVATE HEALTH INSURANCE

## 2020-12-07 ENCOUNTER — Ambulatory Visit
Admit: 2020-12-07 | Discharge: 2020-12-07 | Disposition: A | Discharge status: Home / Self Care | Payer: PRIVATE HEALTH INSURANCE

## 2020-12-07 DIAGNOSIS — F17210 Nicotine dependence, cigarettes, uncomplicated: Principal | ICD-10-CM

## 2020-12-07 DIAGNOSIS — R0781 Pleurodynia: Principal | ICD-10-CM

## 2020-12-07 DIAGNOSIS — W19XXXA Unspecified fall, initial encounter: Principal | ICD-10-CM

## 2020-12-07 DIAGNOSIS — S20212A Contusion of left front wall of thorax, initial encounter: Principal | ICD-10-CM

## 2020-12-07 MED ORDER — HYDROCODONE 5 MG-ACETAMINOPHEN 325 MG TABLET
ORAL_TABLET | Freq: Four times a day (QID) | ORAL | 0 refills | 2 days | Status: CP | PRN
Start: 2020-12-07 — End: ?

## 2020-12-07 MED ORDER — LIDOCAINE 5 % TOPICAL PATCH
MEDICATED_PATCH | TRANSDERMAL | 0 refills | 30 days | Status: CP
Start: 2020-12-07 — End: 2021-01-06

## 2020-12-07 MED ORDER — IBUPROFEN 800 MG TABLET
ORAL_TABLET | Freq: Three times a day (TID) | ORAL | 0 refills | 10 days | Status: CP | PRN
Start: 2020-12-07 — End: ?

## 2020-12-08 ENCOUNTER — Encounter (HOSPITAL_COMMUNITY): Payer: 59 | Admitting: Physical Therapy

## 2020-12-09 ENCOUNTER — Encounter (HOSPITAL_COMMUNITY): Payer: 59 | Admitting: Physical Therapy

## 2020-12-14 ENCOUNTER — Encounter (HOSPITAL_COMMUNITY): Payer: 59

## 2020-12-16 ENCOUNTER — Encounter (HOSPITAL_COMMUNITY): Payer: 59 | Admitting: Physical Therapy

## 2020-12-21 ENCOUNTER — Encounter (HOSPITAL_COMMUNITY): Payer: 59 | Admitting: Physical Therapy

## 2020-12-23 ENCOUNTER — Encounter (HOSPITAL_COMMUNITY): Payer: 59 | Admitting: Physical Therapy

## 2020-12-28 ENCOUNTER — Encounter (HOSPITAL_COMMUNITY): Payer: 59

## 2020-12-30 ENCOUNTER — Encounter (HOSPITAL_COMMUNITY): Payer: 59 | Admitting: Physical Therapy

## 2021-10-08 ENCOUNTER — Emergency Department: Admit: 2021-10-08 | Discharge: 2021-10-08 | Discharge status: Against Medical Advice | Payer: BLUE CROSS/BLUE SHIELD

## 2021-10-08 ENCOUNTER — Ambulatory Visit: Admit: 2021-10-08 | Discharge: 2021-10-08 | Discharge status: Against Medical Advice | Payer: BLUE CROSS/BLUE SHIELD

## 2021-12-16 ENCOUNTER — Other Ambulatory Visit (HOSPITAL_COMMUNITY): Payer: Self-pay | Admitting: Physician Assistant

## 2021-12-16 ENCOUNTER — Other Ambulatory Visit: Payer: Self-pay

## 2021-12-16 ENCOUNTER — Ambulatory Visit (HOSPITAL_COMMUNITY)
Admission: RE | Admit: 2021-12-16 | Discharge: 2021-12-16 | Disposition: A | Discharge status: Home / Self Care | Payer: 59 | Source: Ambulatory Visit | Attending: Physician Assistant | Admitting: Physician Assistant

## 2021-12-16 ENCOUNTER — Other Ambulatory Visit: Payer: Self-pay | Admitting: Physician Assistant

## 2021-12-16 ENCOUNTER — Encounter (HOSPITAL_COMMUNITY): Payer: Self-pay | Admitting: Radiology

## 2021-12-16 DIAGNOSIS — R918 Other nonspecific abnormal finding of lung field: Secondary | ICD-10-CM | POA: Insufficient documentation

## 2021-12-16 LAB — POCT I-STAT CREATININE: Creatinine, Ser: 1 mg/dL (ref 0.61–1.24)

## 2021-12-16 MED ORDER — IOHEXOL 300 MG/ML  SOLN
75.0000 mL | Freq: Once | INTRAMUSCULAR | Status: AC | PRN
  Administered 2021-12-16: 75 mL via INTRAVENOUS

## 2021-12-23 ENCOUNTER — Encounter (HOSPITAL_COMMUNITY): Payer: Self-pay

## 2021-12-23 NOTE — Progress Notes (Signed)
Introductory phone call placed to patient. I introduced myself and explained my role in the patient's care. I provided my contact information and encouraged the patient to call with questions or concerns.

## 2021-12-26 ENCOUNTER — Other Ambulatory Visit: Payer: Self-pay

## 2021-12-26 ENCOUNTER — Encounter (HOSPITAL_COMMUNITY): Payer: Self-pay | Admitting: Hematology

## 2021-12-26 ENCOUNTER — Encounter (HOSPITAL_COMMUNITY): Payer: Self-pay

## 2021-12-26 ENCOUNTER — Inpatient Hospital Stay (HOSPITAL_COMMUNITY): Payer: 59 | Attending: Hematology | Admitting: Hematology

## 2021-12-26 ENCOUNTER — Inpatient Hospital Stay (HOSPITAL_COMMUNITY): Payer: 59

## 2021-12-26 DIAGNOSIS — R1013 Epigastric pain: Secondary | ICD-10-CM | POA: Diagnosis not present

## 2021-12-26 DIAGNOSIS — Z87891 Personal history of nicotine dependence: Secondary | ICD-10-CM | POA: Insufficient documentation

## 2021-12-26 DIAGNOSIS — R918 Other nonspecific abnormal finding of lung field: Secondary | ICD-10-CM | POA: Insufficient documentation

## 2021-12-26 DIAGNOSIS — Z79899 Other long term (current) drug therapy: Secondary | ICD-10-CM | POA: Diagnosis not present

## 2021-12-26 DIAGNOSIS — Z85038 Personal history of other malignant neoplasm of large intestine: Secondary | ICD-10-CM | POA: Diagnosis present

## 2021-12-26 DIAGNOSIS — R0781 Pleurodynia: Secondary | ICD-10-CM | POA: Diagnosis not present

## 2021-12-26 DIAGNOSIS — Z809 Family history of malignant neoplasm, unspecified: Secondary | ICD-10-CM | POA: Insufficient documentation

## 2021-12-26 DIAGNOSIS — C3490 Malignant neoplasm of unspecified part of unspecified bronchus or lung: Secondary | ICD-10-CM

## 2021-12-26 DIAGNOSIS — K921 Melena: Secondary | ICD-10-CM | POA: Insufficient documentation

## 2021-12-26 LAB — CBC WITH DIFFERENTIAL/PLATELET
Abs Immature Granulocytes: 0.03 10*3/uL (ref 0.00–0.07)
Basophils Absolute: 0.1 10*3/uL (ref 0.0–0.1)
Basophils Relative: 1 %
Eosinophils Absolute: 0.4 10*3/uL (ref 0.0–0.5)
Eosinophils Relative: 6 %
HCT: 34.5 % — ABNORMAL LOW (ref 39.0–52.0)
Hemoglobin: 11.5 g/dL — ABNORMAL LOW (ref 13.0–17.0)
Immature Granulocytes: 1 %
Lymphocytes Relative: 36 %
Lymphs Abs: 2.4 10*3/uL (ref 0.7–4.0)
MCH: 31 pg (ref 26.0–34.0)
MCHC: 33.3 g/dL (ref 30.0–36.0)
MCV: 93 fL (ref 80.0–100.0)
Monocytes Absolute: 0.7 10*3/uL (ref 0.1–1.0)
Monocytes Relative: 11 %
Neutro Abs: 2.9 10*3/uL (ref 1.7–7.7)
Neutrophils Relative %: 45 %
Platelets: 210 10*3/uL (ref 150–400)
RBC: 3.71 MIL/uL — ABNORMAL LOW (ref 4.22–5.81)
RDW: 13 % (ref 11.5–15.5)
WBC: 6.6 10*3/uL (ref 4.0–10.5)
nRBC: 0 % (ref 0.0–0.2)

## 2021-12-26 LAB — COMPREHENSIVE METABOLIC PANEL
ALT: 9 U/L (ref 0–44)
AST: 18 U/L (ref 15–41)
Albumin: 3.6 g/dL (ref 3.5–5.0)
Alkaline Phosphatase: 48 U/L (ref 38–126)
Anion gap: 7 (ref 5–15)
BUN: 19 mg/dL (ref 8–23)
CO2: 26 mmol/L (ref 22–32)
Calcium: 9.1 mg/dL (ref 8.9–10.3)
Chloride: 101 mmol/L (ref 98–111)
Creatinine, Ser: 0.89 mg/dL (ref 0.61–1.24)
GFR, Estimated: >60 mL/min (ref >60)
Glucose, Bld: 94 mg/dL (ref 70–99)
Potassium: 4.2 mmol/L (ref 3.5–5.1)
Sodium: 134 mmol/L — ABNORMAL LOW (ref 135–145)
Total Bilirubin: 0.5 mg/dL (ref 0.3–1.2)
Total Protein: 7.7 g/dL (ref 6.5–8.1)

## 2021-12-26 LAB — LACTATE DEHYDROGENASE: LDH: 258 U/L — ABNORMAL HIGH (ref 98–192)

## 2021-12-26 NOTE — Patient Instructions (Addendum)
[place] at [facility] Discharge Instructions  You were seen and examined today by Dr. Delton Coombes. Dr. Delton Coombes is a medical oncologist, meaning that he specializes in the management of cancer diagnoses. Dr. Delton Coombes discussed your past medical history, family history of cancers and the events that led to you being here today.  You were referred to Dr. Delton Coombes due to an abnormal chest CT which revealed multiple nodules in your lung. This is highly concerning for cancer. There are nodules in both of your lung as well as lymph node involvement. This means that the cancer is Stage IV. Treatment options and prognosis will be discussed further when there is more information. In order to identify where the cancer is arising from, you will need a biopsy.  Dr. Delton Coombes has recommended another CT scan to get a photo of your abdomen and pelvis. Dr. Delton Coombes also recommends a brain MRI. This is done for any patient with a lung mass. This will be helpful to identify the stage of your cancer.  Dr. Delton Coombes also recommends checking a CEA. This is a lab test known as a tumor marker. CEA is specific to colon cancer.  Follow-up as scheduled following your scans.   Thank you for choosing [place] at [facility] to provide your oncology and hematology care.  To afford each patient quality time with our provider, please arrive at least 15 minutes before your scheduled appointment time.   If you have a lab appointment with the [place] please come in thru the Main Entrance and check in at the main information desk.  You need to re-schedule your appointment should you arrive 10 or more minutes late.  We strive to give you quality time with our providers, and arriving late affects you and other patients whose appointments are after yours.  Also, if you no show three or more times for appointments you may be dismissed from the clinic at the providers  discretion.     Again, thank you for choosing [facility].  Our hope is that these requests will decrease the amount of time that you wait before being seen by our physicians.       _____________________________________________________________  Should you have questions after your visit to [facility], please contact our office at [phone] and follow the prompts.  Our office hours are 8:00 a.m. and 4:30 p.m. Monday - Friday.  Please note that voicemails left after 4:00 p.m. may not be returned until the following business day.  We are closed weekends and major holidays.  You do have access to a nurse 24-7, just call the main number to the clinic [phone] and do not press any options, hold on the line and a nurse will answer the phone.    For prescription refill requests, have your pharmacy contact our office and allow 72 hours.    Due to Covid, you will need to wear a mask upon entering the hospital. If you do not have a mask, a mask will be given to you at the Main Entrance upon arrival. For doctor visits, patients may have 1 support person age 46 or older with them. For treatment visits, patients can not have anyone with them due to social distancing guidelines and our immunocompromised population.

## 2021-12-26 NOTE — Progress Notes (Signed)
[name] [address] [place] [address]   CLINIC:  Medical Oncology/Hematology  CONSULT NOTE  Patient Care Team: Lavella Lemons, [place] as PCP - General (Physician Assistant) Derek Jack, MD as Medical Oncologist (Medical Oncology) Brien Mates, RN as Oncology Nurse Navigator (Medical Oncology)  CHIEF COMPLAINTS/PURPOSE OF CONSULTATION:  Evaluation of lung mass  HISTORY OF PRESENTING ILLNESS:  Evan [name] 66 y.o. male is here because of evaluation of lung mass, at the request of Aggie Hacker, [place].  Today he reports feeling well. He reports  a dry cough starting 6 months ago which has not resolved. He reports losing 30 lbs over the past 2 years, but he denies sudden unexpected weight loss. He reports constant abdominal pain for which he takes naproxen prn which provides temporary relief; this pain started 1 week ago. He reports black stool starting 1 week ago. He reports noticing the black stool after he started requiring daily naproxen 1 week ago. He denies cardiac issues. He denies tingling/numbness.   He lives at home on his own, and he works as a Facilities manager. He quit smoking 16 years ago; he previously smoked 1/2 ppd. His father had unspecified cancer, and his brother passed from brain cancer 3 years ago.   MEDICAL HISTORY:  Past Medical History:  Diagnosis Date   Arthritis    Colon cancer ([place])    colon   Hepatitis C    Hypertension     SURGICAL HISTORY: Past Surgical History:  Procedure Laterality Date   COLON SURGERY     REPLACEMENT TOTAL KNEE     SHOULDER ARTHROSCOPY     TOTAL HIP ARTHROPLASTY Right 11/09/2020   Procedure: TOTAL HIP ARTHROPLASTY ANTERIOR APPROACH;  Surgeon: Renette Butters, MD;  Location: [facility];  Service: Orthopedics;  Laterality: Right;   WRIST SURGERY Left     SOCIAL HISTORY: Social History   Socioeconomic History   Marital status: Married    Spouse name: Not on file   Number  of children: 7   Years of education: Not on file   Highest education level: Not on file  Occupational History   Occupation: employed  Tobacco Use   Smoking status: Former    Packs/day: 0.50    Types: Cigarettes    Quit date: 11/19/2005    Years since quitting: 16.1   Smokeless tobacco: Never  Vaping Use   Vaping Use: Never used  Substance and Sexual Activity   Alcohol use: Not Currently   Drug use: Yes   Sexual activity: Yes  Other Topics Concern   Not on file  Social History Narrative   Separated from wife 11/2021   Social Determinants of Health   Financial Resource Strain: Not on file  Food Insecurity: Not on file  Transportation Needs: Not on file  Physical Activity: Not on file  Stress: Not on file  Social Connections: Not on file  Intimate Partner Violence: Not on file    FAMILY HISTORY: Family History  Problem Relation Age of Onset   Heart disease Mother    Dementia Father    Heart disease Sister    Cancer Brother    Hypertension Brother    Hypertension Brother    Healthy Son    Healthy Son    Healthy Son    Healthy Son    Healthy Daughter    Healthy Daughter    Healthy Daughter     ALLERGIES:  has No Known  Allergies.  MEDICATIONS:  Current Outpatient Medications  Medication Sig Dispense Refill   NAPROXEN PO Take by mouth.     No current facility-administered medications for this visit.    REVIEW OF SYSTEMS:   Review of Systems  Constitutional:  Positive for appetite change. Negative for fatigue and unexpected weight change.  HENT:   Positive for trouble swallowing (chewing - teeth).   Respiratory:  Positive for cough (dry) and shortness of breath.   Cardiovascular:  Positive for chest pain (9/10).  Gastrointestinal:  Positive for abdominal pain (9/10), constipation and vomiting (black).  Neurological:  Negative for numbness.  All other systems reviewed and are negative.   PHYSICAL EXAMINATION: ECOG PERFORMANCE STATUS: 0 -  Asymptomatic  Vitals:   12/26/21 0825  BP: 119/73  Pulse: 77  Resp: 18  Temp: (!) 97.5 F (36.4 C)  SpO2: 95%   Filed Weights   12/26/21 0825  Weight: 179 lb 6.4 oz (81.4 kg)   Physical Exam Vitals reviewed.  Constitutional:      Appearance: Normal appearance.  Cardiovascular:     Rate and Rhythm: Normal rate and regular rhythm.     Pulses: Normal pulses.     Heart sounds: Normal heart sounds.  Pulmonary:     Effort: Pulmonary effort is normal.     Breath sounds: Normal breath sounds.  Abdominal:     Palpations: Abdomen is soft. There is no hepatomegaly, splenomegaly or mass.     Tenderness: There is no abdominal tenderness.  Lymphadenopathy:     Cervical: No cervical adenopathy.     Right cervical: No superficial, deep or posterior cervical adenopathy.    Left cervical: No superficial, deep or posterior cervical adenopathy.  Neurological:     General: No focal deficit present.     Mental Status: He is alert and oriented to person, place, and time.  Psychiatric:        Mood and Affect: Mood normal.        Behavior: Behavior normal.     LABORATORY DATA:  I have reviewed the data as listed CBC Latest Ref Rng & Units 10/27/2020 12/18/2013 12/09/2013  WBC 4.0 - 10.5 K/uL 5.5 7.8 8.1  Hemoglobin 13.0 - 17.0 g/dL 12.4(L) 14.2 14.4  Hematocrit 39.0 - 52.0 % 36.6(L) 42.2 42.7  Platelets 150 - 400 K/uL 141(L) 162.0 165.0   CMP Latest Ref Rng & Units 12/16/2021 10/27/2020 12/16/2013  Glucose 70 - 99 mg/dL - 90 -  BUN 8 - 23 mg/dL - 12 -  Creatinine 0.61 - 1.24 mg/dL 1.00 0.98 -  Sodium 135 - 145 mmol/L - 140 -  Potassium 3.5 - 5.1 mmol/L - 3.7 -  Chloride 98 - 111 mmol/L - 105 -  CO2 22 - 32 mmol/L - 28 -  Calcium 8.9 - 10.3 mg/dL - 9.2 -  Total Protein 6.0 - 8.3 g/dL - - 8.3  Total Bilirubin 0.3 - 1.2 mg/dL - - 1.1  Alkaline Phos 39 - 117 U/L - - 64  AST 0 - 37 U/L - - 63(H)  ALT 0 - 53 U/L - - 65(H)    RADIOGRAPHIC STUDIES: I have personally reviewed the  radiological images as listed and agreed with the findings in the report. CT CHEST W CONTRAST  Result Date: 12/16/2021 CLINICAL DATA:  Cough, weight loss, history of colon cancer, mass and pulmonary nodules on chest radiograph EXAM: CT CHEST WITH CONTRAST TECHNIQUE: Multidetector CT imaging of the chest was performed during intravenous contrast administration.  CONTRAST:  26mL OMNIPAQUE IOHEXOL 300 MG/ML  SOLN COMPARISON:  Chest radiographs, 12/15/2021, CT chest, 05/22/2019 FINDINGS: Cardiovascular: No significant vascular findings. Normal heart size. No pericardial effusion. Mediastinum/Nodes: Numerous bulky mediastinal and bilateral hilar lymph nodes, largest pretracheal nodes measuring up to 4.1 x 3.1 cm. Thyroid gland, trachea, and esophagus demonstrate no significant findings. Lungs/Pleura: Bulky left hilar mass centered about the superior segment left lower lobe, measuring 8.1 x 7.5 cm (series 4, image 89). Numerous bilateral pulmonary masses and nodules of varying sizes, index mass of the medial right upper lobe measuring 3.9 x 2.3 cm (series 4, image 60), index nodule of the anterior left upper lobe measuring 2.4 x 1.9 cm (series 4, image 60). No pleural effusion or pneumothorax. Upper Abdomen: No acute abnormality. New left adrenal nodule measuring 2.8 x 2.2 cm (series 2, image 153). Musculoskeletal: No chest wall mass or suspicious bone lesions identified. IMPRESSION: 1. Bulky left hilar mass centered about the superior segment left lower lobe, measuring 8.1 x 7.5 cm. 2. Numerous bilateral pulmonary masses and nodules of varying sizes. 3. Numerous bulky mediastinal and bilateral hilar lymph nodes. 4. New left adrenal nodule. 5. Constellation of findings consistent with primary lung malignancy and metastatic disease. These results will be called to the ordering clinician or representative by the Radiologist Assistant, and communication documented in the PACS or [employer]. Electronically Signed   By:  Delanna Ahmadi M.D.   On: 12/16/2021 14:33    ASSESSMENT:  Left lung mass with multiple lung nodules: - Presentation with dry cough for 6 months. - 30 pound weight loss in the last couple of years, weight stable over the last 6 months. - CT chest with contrast on 12/16/2021 showed bulky left hilar mass measuring 8.1 x 7.5 cm.  Numerous bilateral lung nodules of varying sizes.  Left adrenal nodule measuring 2.8 x 2.2 cm.  Mediastinal and bilateral hilar adenopathy with the largest pretracheal node measuring 4.1 x 3.1 cm.   Social/family history: - Lives by himself.  He paints houses for living. - He quit smoking 12 years back and started back again 1 and half year ago and smoked half pack per day.  He quit again about 1 week ago. - Father had cancer, type unknown to the patient.  Brother died of brain tumor.  3.  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  Margins negative.  No lymphovascular or perineural invasion. - Received 3 cycles of XELOX followed by Xeloda for total of 6 months.  Oxaliplatin discontinued during cycle 4 secondary to transaminitis, elevated bilirubin and thrombocytopenia.   PLAN:  Left lung mass with multiple lung nodules: - We have reviewed images of the CT of the chest with the patient. - Differential diagnosis includes metastatic cecal adenocarcinoma versus lung primary. - Recommend CT abdomen and pelvis with contrast to complete staging.  Also recommend brain MRI with and without contrast. - Recommend CT-guided biopsy of the left hilar mass for diagnosis. - We will also check CEA level and LDH levels. - Discussed terminal nature of his disease and treatment intent in the palliative setting. - RTC 1 week after the biopsy.  2.  Lower rib/epigastric pain: - He reports bilateral lower rib pains since CT scan done on 12/16/2021. - He is reportedly taking Naprosyn daily. - He reported black stool  in the last 1 week. - Recommend discontinuing Naprosyn. - We will check CBC today.  All questions were answered. The patient knows to call the clinic with any problems, questions or concerns.   Derek Jack, MD, 12/26/21 8:57 AM  [place] [phone]   I, Thana Ates, am acting as a scribe for Dr. Derek Jack.  I, Derek Jack MD, have reviewed the above documentation for accuracy and completeness, and I agree with the above.

## 2021-12-27 ENCOUNTER — Encounter (HOSPITAL_COMMUNITY): Payer: Self-pay

## 2021-12-27 ENCOUNTER — Encounter: Payer: Self-pay | Admitting: Licensed Clinical Social Worker

## 2021-12-27 DIAGNOSIS — R918 Other nonspecific abnormal finding of lung field: Secondary | ICD-10-CM

## 2021-12-27 LAB — CEA: CEA: 15.6 ng/mL — ABNORMAL HIGH (ref 0.0–4.7)

## 2021-12-27 NOTE — Progress Notes (Signed)
[facility]  Initial Assessment   Evan [name] is a 66 y.o. year old male contacted by phone. Clinical Social Work was referred by Reed Pandy for assessment of psychosocial needs.   SDOH (Social Determinants of Health) assessments performed: No   Distress Screen completed: No No flowsheet data found.    Family/Social Information:  Housing Arrangement: patient lives alone, recently separated Family members/support persons in your life? Family and Medical Staff Transportation concerns: no, patient stated he plans to continue his regular routine during treatment  Employment: Working full time. Income source: Employment Financial concerns: No Type of concern: None Food access concerns: no Religious or spiritual practice: no Medication Concerns: no  Services Currently in place:  N/A  Coping/ Adjustment to diagnosis: Patient understands treatment plan and what happens next? yes Concerns about diagnosis and/or treatment: I'm not especially worried about anything Patient reported stressors:  Patient stated at this moment he does not have any concerns  or stressors Hopes and priorities: To continue his current ADLs, independently Patient enjoys watching TV Current coping skills/ strengths: Average or above average intelligence , Capable of independent living , Financial means , and Work skills     SUMMARY: Current SDOH Barriers:  Patient does not have current barriers, he is independent, continues work and has family support  Clinical Social Work Clinical Goal(s):  Patient agreed to contact CSW if he felt he needed additional support  Interventions: Discussed common feeling and emotions when being diagnosed with cancer, and the importance of support during treatment Informed patient of the support team roles and support services at [facility] Provided [place] contact information and encouraged patient to call with any questions or concerns Patient declined CSW services at this  time   Follow Up Plan: Patient will contact CSW with any support or resource needs Patient verbalizes understanding of plan: Yes  Patient has seven children who are active supports,  is currently independent w/ all ADLS, and continues to work.  Patient stated he will contact CSW for assistance or support if he needs it.   Adelene Amas , LCSW

## 2022-01-03 ENCOUNTER — Other Ambulatory Visit: Payer: Self-pay

## 2022-01-03 ENCOUNTER — Ambulatory Visit (HOSPITAL_COMMUNITY)
Admission: RE | Admit: 2022-01-03 | Discharge: 2022-01-03 | Disposition: A | Discharge status: Home / Self Care | Payer: 59 | Source: Ambulatory Visit | Attending: Hematology | Admitting: Hematology

## 2022-01-03 ENCOUNTER — Encounter (HOSPITAL_COMMUNITY): Payer: Self-pay

## 2022-01-03 DIAGNOSIS — C3490 Malignant neoplasm of unspecified part of unspecified bronchus or lung: Secondary | ICD-10-CM | POA: Diagnosis present

## 2022-01-03 DIAGNOSIS — R918 Other nonspecific abnormal finding of lung field: Secondary | ICD-10-CM | POA: Insufficient documentation

## 2022-01-03 MED ORDER — IOHEXOL 300 MG/ML  SOLN
100.0000 mL | Freq: Once | INTRAMUSCULAR | Status: AC | PRN
  Administered 2022-01-03: 100 mL via INTRAVENOUS

## 2022-01-03 MED ORDER — GADOBUTROL 1 MMOL/ML IV SOLN
7.0000 mL | Freq: Once | INTRAVENOUS | Status: AC | PRN
  Administered 2022-01-03: 7 mL via INTRAVENOUS

## 2022-01-04 ENCOUNTER — Institutional Professional Consult (permissible substitution): Payer: 59 | Admitting: Emergency Medicine

## 2022-01-04 ENCOUNTER — Encounter (HOSPITAL_COMMUNITY): Payer: Self-pay | Admitting: Radiology

## 2022-01-04 NOTE — Progress Notes (Signed)
Patient Name  °Evan [name], Evan [name] Legal Sex  °Male DOB  °11/09/1956 SSN  °[id number] Address  °[address]  °[place] [address] Phone  °[phone] (Home)  °[phone] (Mobile)  ° ° RE: CT Biopsy °Received: Today °Hassell, Daniel, MD  Simpson, Nitasha D °Ok  ° °CT core biop [name]  °See CT 01/03/22 Se 2 Im 12 [name]  ° °[facility]   °  °   °Previous Messages °  °----- Message -----  °From: Simpson, Nitasha D  °Sent: 01/03/2022   6:08 PM EST  °To: Daniel Hassell, MD  °Subject: RE: CT Biopsy                                  ° °CT complete  °----- Message -----  °From: Hassell, Daniel, MD  °Sent: 12/27/2021  10:00 AM EST  °To: Nitasha D Simpson  °Subject: RE: CT Biopsy                                  ° °Resubmit when CT A/P finalized  °Thx  °[facility]  ° ° ° °----- Message -----  °From: Simpson, Nitasha D  °Sent: 12/26/2021  10:19 AM EST  °To: Ir Procedure Requests  °Subject: CT Biopsy                                      ° °Procedure:  CT Biopsy  ° °Reason:  Malignant neoplasm of unspecified part of unspecified bronchus or lung, [name]  ° °History:  CT Chest in computer, CT ABD/PEL, MR Brain scheduled  ° °Provider:  KATRAGADDA, SREEDHAR  ° °Provider Contact:  [phone]  °

## 2022-01-09 IMAGING — RF DG HIP (WITH PELVIS) OPERATIVE*R*
1 series · 2 of 2 positions shown · non-contrast
Comparison: None.

CLINICAL DATA: Right hip replacement

EXAM:
OPERATIVE RIGHT HIP (WITH PELVIS IF PERFORMED) 2 VIEWS
TECHNIQUE: Fluoroscopic spot image(s) were submitted for interpretation
post-operatively.

[Series 1: unknown protocol · 0.20mm/px · 2 of 2 slices shown]
[im 1/2]
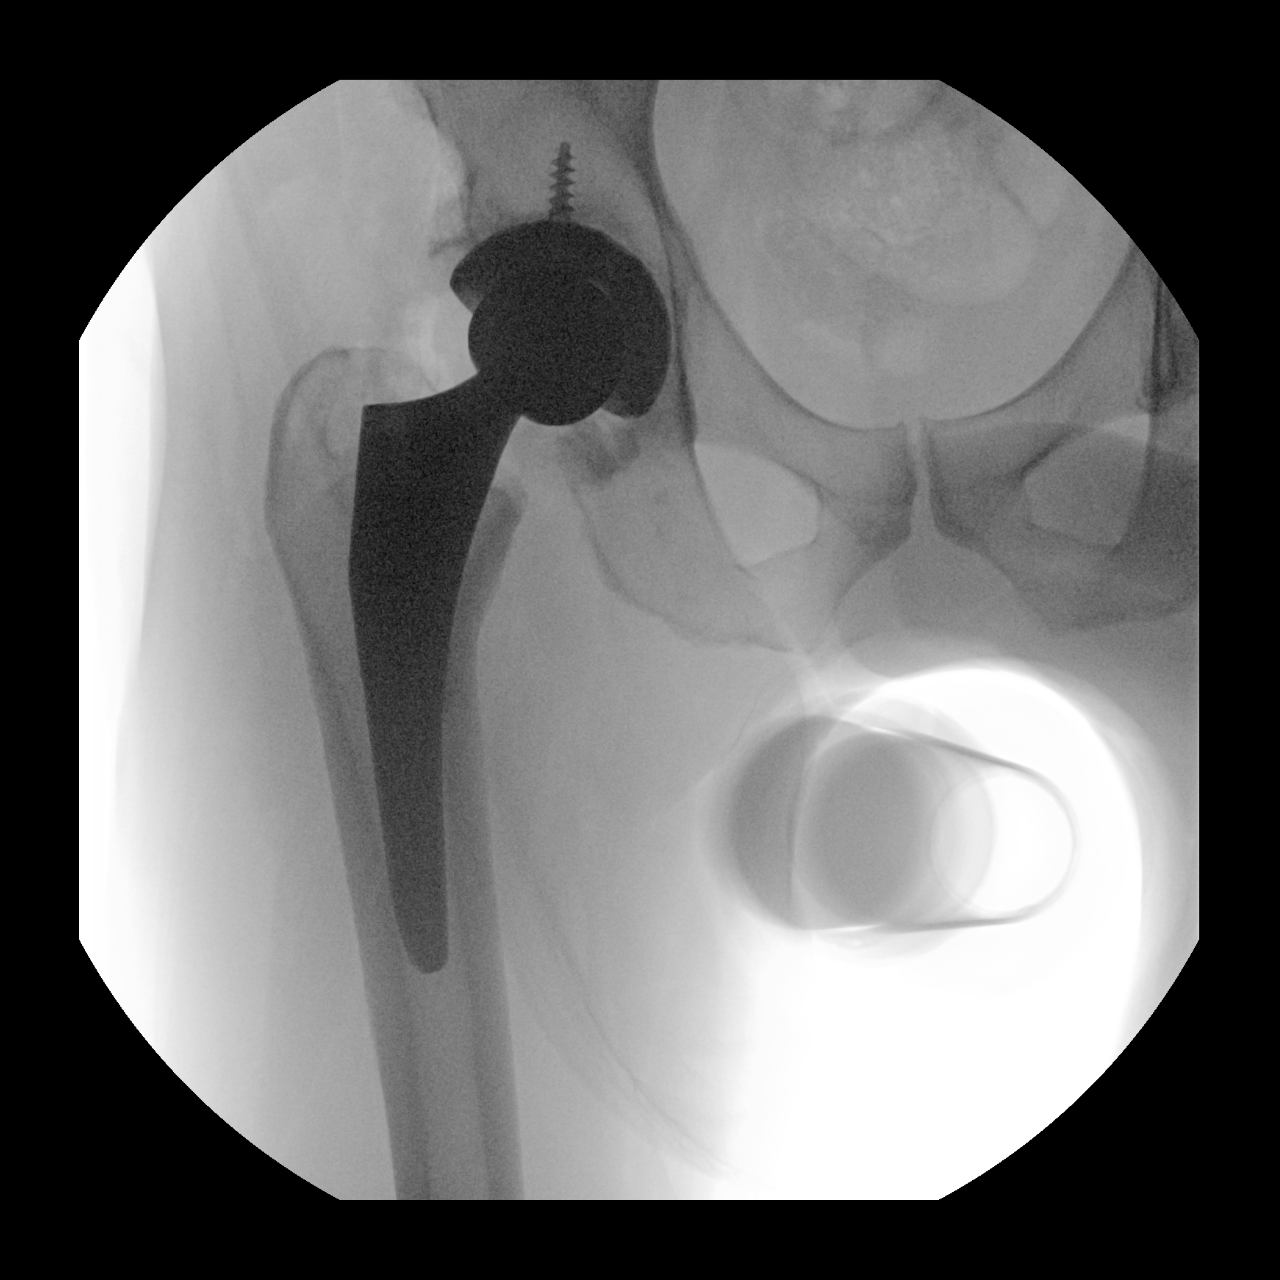
[im 2/2]
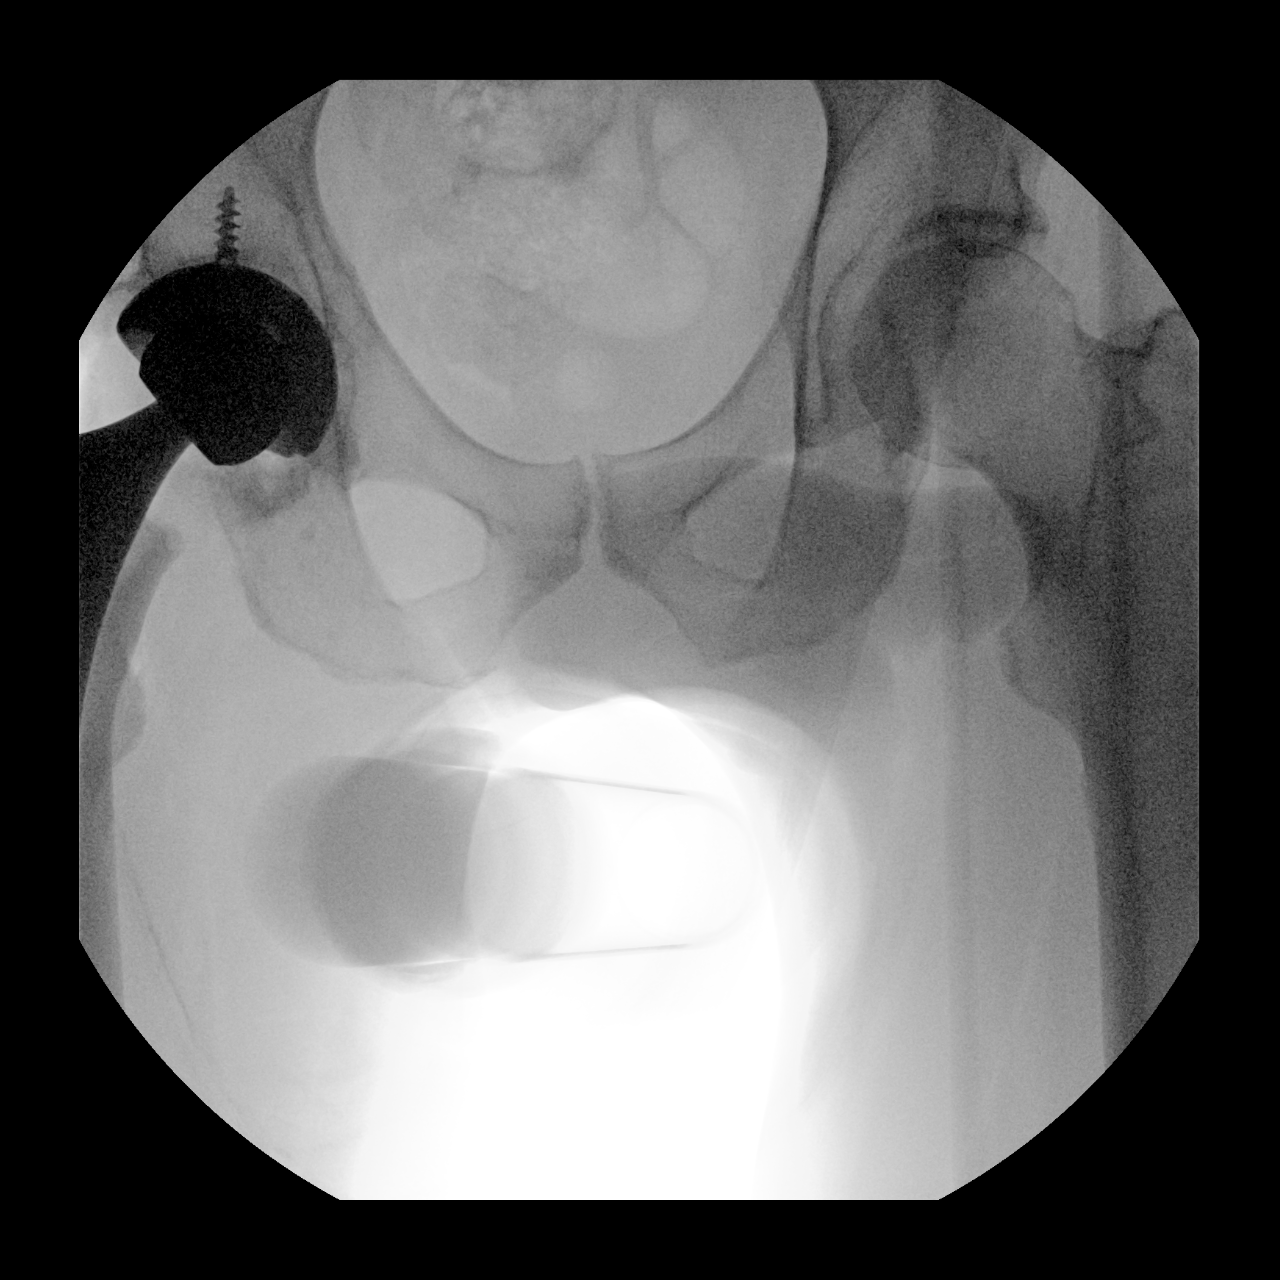

[2 of 2 positions shown; findings below may reference images not displayed]

FINDINGS: Two fluoroscopic images are obtained during the performance of the
procedure and are provided for interpretation only. Right hip
arthroplasty is identified in expected position without evidence of
complication.

FLUOROSCOPY TIME:  13 seconds
IMPRESSION: 1. Unremarkable right hip arthroplasty.

## 2022-01-12 ENCOUNTER — Other Ambulatory Visit (HOSPITAL_COMMUNITY): Payer: Self-pay | Admitting: Physician Assistant

## 2022-01-12 ENCOUNTER — Other Ambulatory Visit: Payer: Self-pay | Admitting: Radiology

## 2022-01-16 ENCOUNTER — Ambulatory Visit (HOSPITAL_COMMUNITY)
Admission: RE | Admit: 2022-01-16 | Discharge: 2022-01-16 | Disposition: A | Discharge status: Home / Self Care | Payer: 59 | Source: Ambulatory Visit | Attending: Hematology | Admitting: Hematology

## 2022-01-16 ENCOUNTER — Other Ambulatory Visit: Payer: Self-pay

## 2022-01-16 ENCOUNTER — Other Ambulatory Visit: Payer: Self-pay | Admitting: Radiology

## 2022-01-16 ENCOUNTER — Encounter (HOSPITAL_COMMUNITY): Payer: Self-pay

## 2022-01-16 DIAGNOSIS — C3490 Malignant neoplasm of unspecified part of unspecified bronchus or lung: Secondary | ICD-10-CM

## 2022-01-16 MED ORDER — SODIUM CHLORIDE 0.9 % IV SOLN: INTRAVENOUS | Status: DC

## 2022-01-17 ENCOUNTER — Ambulatory Visit (HOSPITAL_COMMUNITY)
Admission: RE | Admit: 2022-01-17 | Discharge: 2022-01-17 | Disposition: A | Discharge status: Home / Self Care | Payer: 59 | Source: Ambulatory Visit | Attending: Hematology | Admitting: Hematology

## 2022-01-17 ENCOUNTER — Other Ambulatory Visit: Payer: Self-pay

## 2022-01-17 ENCOUNTER — Encounter (HOSPITAL_COMMUNITY): Payer: Self-pay

## 2022-01-17 ENCOUNTER — Ambulatory Visit (HOSPITAL_COMMUNITY)
Admission: RE | Admit: 2022-01-17 | Discharge: 2022-01-17 | Disposition: A | Discharge status: Home / Self Care | Payer: 59 | Source: Ambulatory Visit | Attending: Interventional Radiology | Admitting: Interventional Radiology

## 2022-01-17 ENCOUNTER — Ambulatory Visit (HOSPITAL_COMMUNITY): Payer: 59 | Admitting: Hematology

## 2022-01-17 DIAGNOSIS — J939 Pneumothorax, unspecified: Secondary | ICD-10-CM | POA: Insufficient documentation

## 2022-01-17 DIAGNOSIS — C3490 Malignant neoplasm of unspecified part of unspecified bronchus or lung: Secondary | ICD-10-CM | POA: Diagnosis present

## 2022-01-17 DIAGNOSIS — R058 Other specified cough: Secondary | ICD-10-CM | POA: Diagnosis not present

## 2022-01-17 DIAGNOSIS — C3492 Malignant neoplasm of unspecified part of left bronchus or lung: Secondary | ICD-10-CM | POA: Insufficient documentation

## 2022-01-17 DIAGNOSIS — R109 Unspecified abdominal pain: Secondary | ICD-10-CM | POA: Diagnosis not present

## 2022-01-17 DIAGNOSIS — K921 Melena: Secondary | ICD-10-CM | POA: Diagnosis not present

## 2022-01-17 DIAGNOSIS — Z85038 Personal history of other malignant neoplasm of large intestine: Secondary | ICD-10-CM | POA: Insufficient documentation

## 2022-01-17 DIAGNOSIS — J850 Gangrene and necrosis of lung: Secondary | ICD-10-CM | POA: Insufficient documentation

## 2022-01-17 LAB — CBC
HCT: 35.6 % — ABNORMAL LOW (ref 39.0–52.0)
Hemoglobin: 11.7 g/dL — ABNORMAL LOW (ref 13.0–17.0)
MCH: 29.1 pg (ref 26.0–34.0)
MCHC: 32.9 g/dL (ref 30.0–36.0)
MCV: 88.6 fL (ref 80.0–100.0)
Platelets: 253 10*3/uL (ref 150–400)
RBC: 4.02 MIL/uL — ABNORMAL LOW (ref 4.22–5.81)
RDW: 12.6 % (ref 11.5–15.5)
WBC: 6.3 10*3/uL (ref 4.0–10.5)
nRBC: 0 % (ref 0.0–0.2)

## 2022-01-17 LAB — PROTIME-INR
INR: 1.1 (ref 0.8–1.2)
Prothrombin Time: 14.5 seconds (ref 11.4–15.2)

## 2022-01-17 MED ORDER — SODIUM CHLORIDE 0.9 % IV SOLN: INTRAVENOUS | Status: DC

## 2022-01-17 MED ORDER — LIDOCAINE HCL 1 % IJ SOLN
INTRAMUSCULAR | Status: AC
  Filled 2022-01-17: qty 10

## 2022-01-17 MED ORDER — FENTANYL CITRATE (PF) 100 MCG/2ML IJ SOLN
INTRAMUSCULAR | Status: AC | PRN
  Administered 2022-01-17 (×2): 50 ug via INTRAVENOUS

## 2022-01-17 MED ORDER — MIDAZOLAM HCL 2 MG/2ML IJ SOLN
INTRAMUSCULAR | Status: AC | PRN
Start: 2022-01-17 — End: 2022-01-17
  Administered 2022-01-17 (×2): 1 mg via INTRAVENOUS

## 2022-01-17 MED ORDER — MIDAZOLAM HCL 2 MG/2ML IJ SOLN
INTRAMUSCULAR | Status: AC
  Filled 2022-01-17: qty 4

## 2022-01-17 MED ORDER — FENTANYL CITRATE (PF) 100 MCG/2ML IJ SOLN
INTRAMUSCULAR | Status: AC
  Filled 2022-01-17: qty 4

## 2022-01-17 NOTE — Procedures (Signed)
Vascular and Interventional Radiology Procedure Note  Patient: Evan [name] DOB: June 07, 1956 Medical Record Number: [id number] Note Date/Time: 01/17/22 12:51 PM   Performing Physician: Michaelle Birks, MD Assistant(s): None  Diagnosis: L lung mass  Procedure: LEFT HILAR LUNG MASS BIOPSY  Anesthesia: Conscious Sedation Complications: None Estimated Blood Loss: Minimal Specimens: Sent for Pathology  Findings:  Successful CT-guided biopsy of L lung mass. A total of 3 samples were obtained. Hemostasis of the tract was achieved using Hemostatic Patch.  Plan: Bed rest for 1 hours.  See detailed procedure note with images in PACS. The patient tolerated the procedure well without incident or complication and was returned to Recovery in stable condition.    Michaelle Birks, MD [facility]   Pager. [place]

## 2022-01-17 NOTE — H&P (Signed)
Chief Complaint: Patient was seen in consultation today for left hilar mass biopsy at the request of [place]  Referring Physician(s): Katragadda,Sreedhar  Supervising Physician: Michaelle Birks  Patient Status: [facility] - Out-pt  History of Present Illness: Evan [name] is a 66 y.o. male   Hx Colon Ca 2018 Dry cough for months Persisted after treatment with PCP 30 lb wt loss over last year or 2-- stable now about 6 mo And pain and black stools CEA 15.6 12/26/21 Follows with Dr Delton Coombes  CT 12/16/21:  IMPRESSION: 1. Bulky left hilar mass centered about the superior segment left lower lobe, measuring 8.1 x 7.5 cm. 2. Numerous bilateral pulmonary masses and nodules of varying sizes. 3. Numerous bulky mediastinal and bilateral hilar lymph nodes. 4. New left adrenal nodule. 5. Constellation of findings consistent with primary lung malignancy and metastatic disease.  Katragadda note 12/26/21: PLAN:  Left lung mass with multiple lung nodules: - We have reviewed images of the CT of the chest with the patient. - Differential diagnosis includes metastatic cecal adenocarcinoma versus lung primary. - Recommend CT abdomen and pelvis with contrast to complete staging.  Also recommend brain MRI with and without contrast. - Recommend CT-guided biopsy of the left hilar mass for diagnosis. - We will also check CEA level and LDH levels. - Discussed terminal nature of his disease and treatment intent in the palliative setting. - RTC 1 week after the biopsy  Scheduled now for Left hilar mass biopsy  Past Medical History:  Diagnosis Date   Arthritis    Colon cancer ([place])    colon   Hepatitis C    Hypertension     Past Surgical History:  Procedure Laterality Date   COLON SURGERY     REPLACEMENT TOTAL KNEE     SHOULDER ARTHROSCOPY     TOTAL HIP ARTHROPLASTY Right 11/09/2020   Procedure: TOTAL HIP ARTHROPLASTY ANTERIOR APPROACH;  Surgeon: Renette Butters, MD;   Location: [facility];  Service: Orthopedics;  Laterality: Right;   WRIST SURGERY Left     Allergies: Patient has no known allergies.  Medications: Prior to Admission medications   Medication Sig Start Date End Date Taking? Authorizing Provider  NAPROXEN PO Take by mouth.   Yes [provider]     Family History  Problem Relation Age of Onset   Heart disease Mother    Dementia Father    Heart disease Sister    Cancer Brother    Hypertension Brother    Hypertension Brother    Healthy Son    Healthy Son    Healthy Son    Healthy Son    Healthy Daughter    Healthy Daughter    Healthy Daughter     Social History   Socioeconomic History   Marital status: Married    Spouse name: Not on file   Number of children: 7   Years of education: Not on file   Highest education level: Not on file  Occupational History   Occupation: employed  Tobacco Use   Smoking status: Former    Packs/day: 0.50    Types: Cigarettes    Quit date: 11/19/2005    Years since quitting: 16.1   Smokeless tobacco: Never  Vaping Use   Vaping Use: Never used  Substance and Sexual Activity   Alcohol use: Not Currently   Drug use: Yes   Sexual activity: Yes  Other Topics Concern   Not on file  Social History Narrative   **  Merged History Encounter **       Separated from wife 11/2021   Social Determinants of Health   Financial Resource Strain: Not on file  Food Insecurity: Not on file  Transportation Needs: Not on file  Physical Activity: Not on file  Stress: Not on file  Social Connections: Not on file    Review of Systems: A 12 point ROS discussed and pertinent positives are indicated in the HPI above.  All other systems are negative.  Review of Systems  Constitutional:  Positive for unexpected weight change. Negative for activity change, appetite change, fatigue and fever.  Respiratory:  Negative for cough and shortness of breath.   Cardiovascular:  Negative for chest pain.   Gastrointestinal:  Negative for abdominal pain, nausea and vomiting.  Neurological:  Negative for weakness.  Psychiatric/Behavioral:  Negative for behavioral problems and confusion.    Vital Signs: BP 108/71    Pulse 71    Temp 97.8 F (36.6 C) (Oral)    Resp 16    Ht 5\' 11"  (1.803 m)    Wt 180 lb (81.6 kg)    SpO2 98%    BMI 25.10 kg/m   Physical Exam Vitals reviewed.  HENT:     Mouth/Throat:     Mouth: Mucous membranes are moist.  Cardiovascular:     Rate and Rhythm: Normal rate and regular rhythm.     Heart sounds: Normal heart sounds.  Pulmonary:     Effort: Pulmonary effort is normal.     Breath sounds: Normal breath sounds.  Abdominal:     Palpations: Abdomen is soft.  Musculoskeletal:        General: Normal range of motion.  Skin:    General: Skin is warm.  Neurological:     Mental Status: He is alert and oriented to person, place, and time.  Psychiatric:        Behavior: Behavior normal.    Imaging: MR Brain W Wo Contrast  Result Date: 01/04/2022 CLINICAL DATA:  Lung mass, staging EXAM: MRI HEAD WITHOUT AND WITH CONTRAST TECHNIQUE: Multiplanar, multiecho pulse sequences of the brain and surrounding structures were obtained without and with intravenous contrast. CONTRAST:  12mL GADAVIST GADOBUTROL 1 MMOL/ML IV SOLN COMPARISON:  None. FINDINGS: Brain: There is no acute infarction or intracranial hemorrhage. There is no intracranial mass, mass effect, or edema. There is no hydrocephalus or extra-axial fluid collection. Ventricles and sulci are normal in size and configuration. No abnormal enhancement. Vascular: Major vessel flow voids at the skull base are preserved. Skull and upper cervical spine: Normal marrow signal is preserved. Sinuses/Orbits: Paranasal sinuses are aerated. Orbits are unremarkable. Other: Sella is unremarkable.  Mastoid air cells are clear. IMPRESSION: No evidence of intracranial metastatic disease. Electronically Signed   By: Macy Mis M.D.   On:  01/04/2022 10:35   CT Abdomen Pelvis W Contrast  Result Date: 01/03/2022 CLINICAL DATA:  Non-small-cell lung cancer, staging. History of colon cancer. EXAM: CT ABDOMEN AND PELVIS WITH CONTRAST TECHNIQUE: Multidetector CT imaging of the abdomen and pelvis was performed using the standard protocol following bolus administration of intravenous contrast. RADIATION DOSE REDUCTION: This exam was performed according to the departmental dose-optimization program which includes automated exposure control, adjustment of the mA and/or kV according to patient size and/or use of iterative reconstruction technique. CONTRAST:  143mL OMNIPAQUE IOHEXOL 300 MG/ML  SOLN COMPARISON:  12/16/2021 chest CT. 05/22/2019 abdominopelvic CT from [facility] rocking [place] FINDINGS: Lower chest: Bilateral pulmonary lesions, as detailed on dedicated  chest CT. Normal heart size without pericardial or pleural effusion. Hepatobiliary: Normal liver. Normal gallbladder, without biliary ductal dilatation. Pancreas: Normal, without mass or ductal dilatation. Spleen: 7 mm low-density splenic lesion is nonspecific, but of doubtful clinical significance. No splenomegaly. Adrenals/Urinary Tract: Normal right adrenal gland. A left adrenal nodule of 2.5 cm is new since the remote abdominal CT and most consistent with metastatic disease. Normal kidneys, without hydronephrosis. Normal urinary bladder. Stomach/Bowel: Normal stomach, without wall thickening. Colonic stool burden suggests constipation. Partial right hemicolectomy. Normal small bowel. Vascular/Lymphatic: Aortic atherosclerosis. No abdominopelvic adenopathy. Reproductive: Grossly normal prostate; beam hardening artifact from right hip arthroplasty degrades evaluation. Other: No significant free fluid. No evidence of omental or peritoneal disease. Musculoskeletal: Right hip arthroplasty. Mild left hip osteoarthritis. IMPRESSION: 1. Isolated left adrenal metastasis, as on chest CT. No other evidence of  metastatic disease within the abdomen or pelvis. 2.  Possible constipation. 3.  Aortic Atherosclerosis (ICD10-I70.0). Electronically Signed   By: Abigail Miyamoto M.D.   On: 01/03/2022 16:23    Labs:  CBC: Recent Labs    12/26/21 1010 01/17/22 0948  WBC 6.6 6.3  HGB 11.5* 11.7*  HCT 34.5* 35.6*  PLT 210 253    COAGS: Recent Labs    01/17/22 0948  INR 1.1    BMP: Recent Labs    12/16/21 1346 12/26/21 1010  NA  --  134*  K  --  4.2  CL  --  101  CO2  --  26  GLUCOSE  --  94  BUN  --  19  CALCIUM  --  9.1  CREATININE 1.00 0.89  GFRNONAA  --  >60    LIVER FUNCTION TESTS: Recent Labs    12/26/21 1010  BILITOT 0.5  AST 18  ALT 9  ALKPHOS 48  PROT 7.7  ALBUMIN 3.6    TUMOR MARKERS: No results for input(s): AFPTM, CEA, CA199, CHROMGRNA in the last 8760 hours.  Assessment and Plan:  Left hilar mass Hx colon cancer 2018 Request for biopsy for diagnosis and planning Risks and benefits of CT guided left hilar mass biopsy was discussed with the patient including, but not limited to bleeding, hemoptysis, respiratory failure requiring intubation, infection, pneumothorax requiring chest tube placement, stroke from air embolism or even death.  All of the patient's questions were answered and the patient is agreeable to proceed. Consent signed and in chart.   Thank you for this interesting consult.  I greatly enjoyed meeting DAMARI SUASTEGUI and look forward to participating in their care.  A copy of this report was sent to the requesting provider on this date.  Electronically Signed: Lavonia Drafts, PA-C 01/17/2022, 10:28 AM   I spent a total of  30 Minutes   in face to face in clinical consultation, greater than 50% of which was counseling/coordinating care for left hilar mass bx

## 2022-01-18 NOTE — Progress Notes (Signed)
Will be reviewed at 2/13 OV

## 2022-01-19 LAB — SURGICAL PATHOLOGY

## 2022-01-23 ENCOUNTER — Inpatient Hospital Stay (HOSPITAL_COMMUNITY): Payer: 59 | Attending: Hematology | Admitting: Hematology

## 2022-01-23 ENCOUNTER — Other Ambulatory Visit: Payer: Self-pay

## 2022-01-23 VITALS — BP 114/77 | HR 83 | Temp 97.7°F | Resp 18 | Ht 71.0 in | Wt 176.8 lb

## 2022-01-23 DIAGNOSIS — C3490 Malignant neoplasm of unspecified part of unspecified bronchus or lung: Secondary | ICD-10-CM | POA: Diagnosis not present

## 2022-01-23 DIAGNOSIS — D649 Anemia, unspecified: Secondary | ICD-10-CM | POA: Diagnosis not present

## 2022-01-23 DIAGNOSIS — Z79899 Other long term (current) drug therapy: Secondary | ICD-10-CM | POA: Insufficient documentation

## 2022-01-23 DIAGNOSIS — C7972 Secondary malignant neoplasm of left adrenal gland: Secondary | ICD-10-CM | POA: Diagnosis not present

## 2022-01-23 DIAGNOSIS — C189 Malignant neoplasm of colon, unspecified: Secondary | ICD-10-CM | POA: Diagnosis not present

## 2022-01-23 DIAGNOSIS — C7802 Secondary malignant neoplasm of left lung: Secondary | ICD-10-CM | POA: Insufficient documentation

## 2022-01-23 DIAGNOSIS — R918 Other nonspecific abnormal finding of lung field: Secondary | ICD-10-CM

## 2022-01-23 DIAGNOSIS — R1013 Epigastric pain: Secondary | ICD-10-CM | POA: Diagnosis not present

## 2022-01-23 DIAGNOSIS — Z87891 Personal history of nicotine dependence: Secondary | ICD-10-CM | POA: Diagnosis not present

## 2022-01-23 DIAGNOSIS — C180 Malignant neoplasm of cecum: Secondary | ICD-10-CM | POA: Insufficient documentation

## 2022-01-23 DIAGNOSIS — C7801 Secondary malignant neoplasm of right lung: Secondary | ICD-10-CM | POA: Diagnosis present

## 2022-01-23 NOTE — Progress Notes (Signed)
[place] testing sent on [phone] per Dr. Delton Coombes

## 2022-01-23 NOTE — Patient Instructions (Addendum)
[place] at [facility] Discharge Instructions   You were seen and examined today by Dr. Delton Coombes.  He reviewed the results of your biopsy.  It confirms a diagnosis of recurrent colon cancer. He also reviewed the results of your CT scan.  There is a mass that has spread to your left adrenal gland, as well as colon cancer that has spread to your lungs.  This makes this a Stage IV cancer.  This means this cancer is not curable.  But it is treatable.  We can initiate treatment to shrink the tumors you have and also prevent the cancer from spreading further with treatment.   We will also send special testing on your biopsy tissue to help determine all of the treatment options available to you. We will see you back in a couple of weeks to review the results of this testing to discuss treatment options available to you.      Thank you for choosing [place] at [facility] to provide your oncology and hematology care.  To afford each patient quality time with our provider, please arrive at least 15 minutes before your scheduled appointment time.   If you have a lab appointment with the [place] please come in thru the Main Entrance and check in at the main information desk.  You need to re-schedule your appointment should you arrive 10 or more minutes late.  We strive to give you quality time with our providers, and arriving late affects you and other patients whose appointments are after yours.  Also, if you no show three or more times for appointments you may be dismissed from the clinic at the providers discretion.     Again, thank you for choosing [facility].  Our hope is that these requests will decrease the amount of time that you wait before being seen by our physicians.       _____________________________________________________________  Should you have questions after your visit to [facility], please contact  our office at [phone] and follow the prompts.  Our office hours are 8:00 a.m. and 4:30 p.m. Monday - Friday.  Please note that voicemails left after 4:00 p.m. may not be returned until the following business day.  We are closed weekends and major holidays.  You do have access to a nurse 24-7, just call the main number to the clinic [phone] and do not press any options, hold on the line and a nurse will answer the phone.    For prescription refill requests, have your pharmacy contact our office and allow 72 hours.    Due to Covid, you will need to wear a mask upon entering the hospital. If you do not have a mask, a mask will be given to you at the Main Entrance upon arrival. For doctor visits, patients may have 1 support person age 27 or older with them. For treatment visits, patients can not have anyone with them due to social distancing guidelines and our immunocompromised population.

## 2022-01-23 NOTE — Progress Notes (Signed)
[place], [place] [address]   CLINIC:  Medical Oncology/Hematology  PCP:  Lavella Lemons, PA [address] 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, a 66 y.o. male, returns for routine follow-up of his left lung mass. Evan [name] was last seen on 12/26/2021.   Today he reports feeling good. He reports continued lower rib and epigastric pain for which he has continued to take Naproxen. He denies any recurrence of black stools.   REVIEW OF SYSTEMS:  Review of Systems  Constitutional:  Positive for appetite change. Negative for fatigue.  HENT:   Positive for trouble swallowing.   Respiratory:  Positive for cough, hemoptysis and shortness of breath.   Cardiovascular:  Positive for chest pain (4/10 lower rib).  Gastrointestinal:  Positive for abdominal pain (epigastric) and constipation.  Psychiatric/Behavioral:  Positive for depression. The patient is nervous/anxious.   All other systems reviewed and are negative.  PAST MEDICAL/SURGICAL HISTORY:  Past  Medical History:  Diagnosis Date   Arthritis    Colon cancer ([place])    colon   Hepatitis C    Hypertension    Past Surgical History:  Procedure Laterality Date   COLON SURGERY     REPLACEMENT TOTAL KNEE     SHOULDER ARTHROSCOPY     TOTAL HIP ARTHROPLASTY Right 11/09/2020   Procedure: TOTAL HIP ARTHROPLASTY ANTERIOR APPROACH;  Surgeon: Renette Butters, MD;  Location: [facility];  Service: Orthopedics;  Laterality: Right;   WRIST SURGERY Left     SOCIAL HISTORY:  Social History   Socioeconomic History   Marital status: Married    Spouse name: Not on file   Number of children: 7   Years of education: Not on file   Highest education level: Not on file  Occupational History   Occupation: employed  Tobacco Use   Smoking status: Former    Packs/day: 0.50    Types: Cigarettes    Quit date: 11/19/2005    Years since quitting: 16.1   Smokeless tobacco: Never  Vaping Use   Vaping Use: Never used  Substance and Sexual Activity   Alcohol use: Not Currently   Drug use: Yes   Sexual activity: Yes  Other Topics Concern   Not on file  Social History Narrative   ** Merged History Encounter **       Separated from wife 11/2021   Social Determinants of Health   Financial Resource Strain: Not on file  Food Insecurity: Not on file  Transportation Needs: Not on file  Physical Activity: Not on file  Stress: Not on file  Social Connections: Not on file  Intimate Partner Violence: Not on file    FAMILY HISTORY:  Family History  Problem Relation Age of Onset   Heart disease Mother    Dementia Father    Heart disease Sister    Cancer Brother    Hypertension Brother    Hypertension Brother    Healthy Son    Healthy Son    Healthy Son    Healthy Son    Healthy Daughter    Healthy Daughter    Healthy Daughter     CURRENT MEDICATIONS:  Current Outpatient Medications  Medication Sig Dispense Refill   NAPROXEN PO Take by mouth.     No current facility-administered  medications for this visit.    ALLERGIES:  No Known Allergies  PHYSICAL EXAM:  Performance status (ECOG): 0 - Asymptomatic  Vitals:   01/23/22 1122  BP: 114/77  Pulse: 83  Resp: 18  Temp: 97.7 F (36.5 C)  SpO2: 95%   Wt Readings from Last 3 Encounters:  01/23/22 176 lb 12.8 oz (80.2 kg)  01/17/22 180 lb (81.6 kg)  12/26/21 179 lb 6.4 oz (81.4 kg)   Physical Exam Vitals reviewed.  Constitutional:      Appearance: Normal appearance.  Cardiovascular:     Rate and Rhythm: Normal rate and regular rhythm.     Pulses: Normal pulses.     Heart sounds: Normal heart sounds.  Pulmonary:     Effort: Pulmonary effort is normal.     Breath sounds: Normal breath sounds.  Neurological:     General: No focal deficit present.     Mental Status: He is alert and oriented to person, place, and time.  Psychiatric:        Mood and Affect: Mood normal.        Behavior: Behavior normal.     LABORATORY DATA:  I have reviewed the labs as listed.  CBC Latest Ref Rng & Units 01/17/2022 12/26/2021 10/27/2020  WBC 4.0 - 10.5 K/uL 6.3 6.6 5.5  Hemoglobin 13.0 - 17.0 g/dL 11.7(L) 11.5(L) 12.4(L)  Hematocrit 39.0 - 52.0 % 35.6(L) 34.5(L) 36.6(L)  Platelets 150 - 400 K/uL 253 210 141(L)   CMP Latest Ref Rng & Units 12/26/2021 12/16/2021 10/27/2020  Glucose 70 - 99 mg/dL 94 - 90  BUN 8 - 23 mg/dL 19 - 12  Creatinine 0.61 - 1.24 mg/dL 0.89 1.00 0.98  Sodium 135 - 145 mmol/L 134(L) - 140  Potassium 3.5 - 5.1 mmol/L 4.2 - 3.7  Chloride 98 - 111 mmol/L 101 - 105  CO2 22 - 32 mmol/L 26 - 28  Calcium 8.9 - 10.3 mg/dL 9.1 - 9.2  Total Protein 6.5 - 8.1 g/dL 7.7 - -  Total Bilirubin 0.3 - 1.2 mg/dL 0.5 - -  Alkaline Phos 38 - 126 U/L 48 - -  AST 15 - 41 U/L 18 - -  ALT 0 - 44 U/L 9 - -    DIAGNOSTIC IMAGING:  I have independently reviewed the scans and discussed with the patient. CT GUIDED NEEDLE PLACEMENT  Result Date: 01/17/2022 INDICATION: LEFT lung mass. EXAM: CT GUIDANCE NEEDLE PLACEMENT  Procedure: CT-GUIDED LEFT HILAR LUNG MASS BIOPSY RADIATION DOSE REDUCTION: This exam was performed according to the departmental dose-optimization program which includes automated exposure control, adjustment of the mA and/or kV according to patient size and/or use of iterative reconstruction technique. COMPARISON:  CT chest, 12/16/2021. MEDICATIONS: None. ANESTHESIA/SEDATION: Fentanyl 100 mcg IV; Versed  2 mg IV Sedation time: 12 minutes; The patient was continuously monitored during the procedure by the interventional radiology nurse under my direct supervision. CONTRAST:  None. COMPLICATIONS: None immediate. PROCEDURE: Informed consent was obtained from the patient following an explanation of the procedure, risks, benefits and alternatives. A time out was performed prior to the initiation of the procedure. The patient was positioned supine on the CT table and a limited CT was performed for procedural planning demonstrating LEFT hilar lung mass. The procedure was planned. The operative site was prepped and draped in the usual sterile fashion. Appropriate trajectory was confirmed with a 22 gauge spinal needle after the adjacent tissues were anesthetized with 1% Lidocaine with epinephrine. Under intermittent CT guidance, a 17 gauge coaxial needle was advanced into the peripheral aspect of the mass. Appropriate positioning was confirmed and 3 samples were obtained with an 18 gauge core needle biopsy device. The co-axial needle was removed and hemostasis was achieved with hemostatic patch. A limited postprocedural CT was negative for pneumothorax, pulmonary hemorrhage or additional complication. A dressing was placed. The patient tolerated the procedure well without immediate postprocedural complication. IMPRESSION: Successful CT guided core needle biopsy of LEFT hilar lung mass, as above. Michaelle Birks, MD [facility] Electronically Signed   By: Michaelle Birks  M.D.   On: 01/17/2022 13:26   MR Brain W Wo Contrast  Result Date: 01/04/2022 CLINICAL DATA:  Lung mass, staging EXAM: MRI HEAD WITHOUT AND WITH CONTRAST TECHNIQUE: Multiplanar, multiecho pulse sequences of the brain and surrounding structures were obtained without and with intravenous contrast. CONTRAST:  65m GADAVIST GADOBUTROL 1 MMOL/ML IV SOLN COMPARISON:  None. FINDINGS: Brain: There is no acute infarction or intracranial hemorrhage. There is no intracranial mass, mass effect, or edema. There is no hydrocephalus or extra-axial fluid collection. Ventricles and sulci are normal in size and configuration. No abnormal enhancement. Vascular: Major vessel flow voids at the skull base are preserved. Skull and upper cervical spine: Normal marrow signal is preserved. Sinuses/Orbits: Paranasal sinuses are aerated. Orbits are unremarkable. Other: Sella is unremarkable.  Mastoid air cells are clear. IMPRESSION: No evidence of intracranial metastatic disease. Electronically Signed   By: PMacy MisM.D.   On: 01/04/2022 10:35   CT Abdomen Pelvis W Contrast  Result Date: 01/03/2022 CLINICAL DATA:  Non-small-cell lung cancer, staging. History of colon cancer. EXAM: CT ABDOMEN AND PELVIS WITH CONTRAST TECHNIQUE: Multidetector CT imaging of the abdomen and pelvis was performed using the standard protocol following bolus administration of intravenous contrast. RADIATION DOSE REDUCTION: This exam was performed according to the departmental dose-optimization program which includes automated exposure control, adjustment of the mA and/or kV according to patient size and/or use of iterative reconstruction technique. CONTRAST:  1083mOMNIPAQUE IOHEXOL 300 MG/ML  SOLN COMPARISON:  12/16/2021 chest CT. [facility]22/[facility] FINDINGS: Lower chest: Bilateral pulmonary lesions, as detailed on dedicated chest CT. Normal heart size without pericardial or pleural effusion. Hepatobiliary: Normal liver.  Normal gallbladder, without biliary ductal dilatation. Pancreas: Normal, without mass or ductal dilatation. Spleen: 7 mm low-density splenic lesion is nonspecific, but of doubtful clinical significance. No splenomegaly. Adrenals/Urinary Tract: Normal right adrenal gland. A left adrenal nodule of 2.5 cm is new since the remote abdominal CT and most consistent with metastatic disease. Normal kidneys, without hydronephrosis. Normal urinary bladder. Stomach/Bowel: Normal stomach, without wall thickening. Colonic stool burden suggests constipation. Partial right hemicolectomy. Normal small bowel. Vascular/Lymphatic: Aortic atherosclerosis. No abdominopelvic adenopathy. Reproductive:  Grossly normal prostate; beam hardening artifact from right hip arthroplasty degrades evaluation. Other: No significant free fluid. No evidence of omental or peritoneal disease. Musculoskeletal: Right hip arthroplasty. Mild left hip osteoarthritis. IMPRESSION: 1. Isolated left adrenal metastasis, as on chest CT. No other evidence of metastatic disease within the abdomen or pelvis. 2.  Possible constipation. 3.  Aortic Atherosclerosis (ICD10-I70.0). Electronically Signed   By: Abigail Miyamoto M.D.   On: 01/03/2022 16:23   DG Chest Port 1 View  Result Date: 01/17/2022 CLINICAL DATA:  Status post right lung biopsy. EXAM: PORTABLE CHEST 1 VIEW COMPARISON:  December 15, 2021. FINDINGS: Normal cardiac size. Large left perihilar mass is again noted. Multiple rounded densities are noted throughout both lungs concerning for metastatic disease. No definite pneumothorax is noted status post biopsy. Bony thorax unremarkable. IMPRESSION: No definite pneumothorax seen status post left lung biopsy. Electronically Signed   By: Marijo Conception M.D.   On: 01/17/2022 14:10     ASSESSMENT:  Left lung mass with multiple lung nodules: - Presentation with dry cough for 6 months. - 30 pound weight loss in the last couple of years, weight stable over the last 6  months. - CT chest with contrast on 12/16/2021 showed bulky left hilar mass measuring 8.1 x 7.5 cm.  Numerous bilateral lung nodules of varying sizes.  Left adrenal nodule measuring 2.8 x 2.2 cm.  Mediastinal and bilateral hilar adenopathy with the largest pretracheal node measuring 4.1 x 3.1 cm.    Social/family history: - Lives by himself.  He paints houses for living. - He quit smoking 12 years back and started back again 1 and half year ago and smoked half pack per day.  He quit again about 1 week ago. - Father had cancer, type unknown to the patient.  Brother died of brain tumor.  3.  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  Margins negative.  No lymphovascular or perineural invasion. - Received 3 cycles of XELOX followed by Xeloda for total of 6 months.  Oxaliplatin discontinued during cycle 4 secondary to transaminitis, elevated bilirubin and thrombocytopenia.   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 - Reviewed CTAP from 01/03/2022 which showed isolated left adrenal metastasis with no other evidence of metastatic disease in the abdomen or pelvis. - Reviewed pathology report from the left lung biopsy which shows adenocarcinoma with necrosis.  CK20 positive and CDX2 positive but negative for CK7 indicating colonic primary. - We have talked about normal prognosis of metastatic colon cancer with median survival of 2 to 3 years. - I have recommended NGS testing for MSI, RAS and other targetable mutations. - We talked about palliative chemotherapy to keep the disease under control for the rest of his life. - He has poorly tolerated oxaliplatin in the past when he had elevated LFTs.  However he was also treated for hep C with Harvoni after that. - I have recommended FOLFIRI with bevacizumab as first-line  treatment. - I have recommended port placement. - We will see him back in 2 weeks after we get the NGS results back.  2.  Lower rib/epigastric pain: - He has occasional pain at the xiphisternum. - Continue Naprosyn as needed which is helping.   Orders placed this encounter:  No orders of the defined types were placed in this encounter.  Derek Jack, MD [facility] [phone]   I, Thana Ates, am acting as a scribe for Dr. Derek Jack.  I, Derek Jack MD, have reviewed the above documentation for accuracy and completeness, and I agree with the above.

## 2022-01-31 ENCOUNTER — Encounter: Payer: Self-pay | Admitting: Surgery

## 2022-01-31 ENCOUNTER — Ambulatory Visit (INDEPENDENT_AMBULATORY_CARE_PROVIDER_SITE_OTHER): Payer: 59 | Admitting: Surgery

## 2022-01-31 ENCOUNTER — Other Ambulatory Visit: Payer: Self-pay

## 2022-01-31 VITALS — BP 103/69 | HR 69 | Temp 97.1°F | Resp 16 | Ht 71.0 in | Wt 179.0 lb

## 2022-01-31 DIAGNOSIS — R918 Other nonspecific abnormal finding of lung field: Secondary | ICD-10-CM | POA: Diagnosis not present

## 2022-01-31 NOTE — Progress Notes (Signed)
[facility] History and Physical  Reason for Referral: Port-a-cath insertion Referring Physician: Dr. [name] is a 66 y.o. male.  HPI: Patient presents to discuss Port-a-cath insertion.  Patient has a history of colon cancer of the cecum, status post laparoscopic right hemicolectomy in May 2018, and status postchemotherapy treatment.  In January of this year, he was noted to have a left lung mass, and biopsy demonstrates concern for metastatic colon cancer.  He has had increasing shortness of breath and difficulty breathing since that time.  He had noted that he had a decreased appetite over the last 6 months and had 30 pound weight loss in that same time, however, he was awaiting return of his insurance in January, at which time he was evaluated.  For his last cancer treatment, he required bilateral port placements, as his initial port was infected requiring port placement on the other side.  He quit smoking in January after diagnosis of this new lung mass.  He denies use of blood thinning medications, alcohol, and illegal drugs.  Past Medical History:  Diagnosis Date   Arthritis    Colon cancer ([place])    colon   Hepatitis C    Hypertension     Past Surgical History:  Procedure Laterality Date   COLON SURGERY     REPLACEMENT TOTAL KNEE     SHOULDER ARTHROSCOPY     TOTAL HIP ARTHROPLASTY Right 11/09/2020   Procedure: TOTAL HIP ARTHROPLASTY ANTERIOR APPROACH;  Surgeon: Renette Butters, MD;  Location: [facility];  Service: Orthopedics;  Laterality: Right;   WRIST SURGERY Left     Family History  Problem Relation Age of Onset   Heart disease Mother    Dementia Father    Heart disease Sister    Cancer Brother    Hypertension Brother    Hypertension Brother    Healthy Son    Healthy Son    Healthy Son    Healthy Son    Healthy Daughter    Healthy Daughter    Healthy Daughter     Social History   Tobacco Use   Smoking status: Former     Packs/day: 0.50    Types: Cigarettes    Quit date: 11/19/2005    Years since quitting: 16.2   Smokeless tobacco: Never  Vaping Use   Vaping Use: Never used  Substance Use Topics   Alcohol use: Not Currently   Drug use: Yes    Medications: I have reviewed the patient's current medications. Allergies as of 01/31/2022   No Known Allergies      Medication List        Accurate as of January 31, 2022  9:02 AM. If you have any questions, ask your nurse or doctor.          NAPROXEN PO Take by mouth.         ROS:  Constitutional: negative for chills, fatigue, and fevers Eyes: negative for visual disturbance and pain Ears, nose, mouth, throat, and face: negative for ear drainage, sore throat, and sinus problems Respiratory: positive for cough, wheezing, and shortness of breath Cardiovascular: negative for chest pain and palpitations Gastrointestinal: negative for abdominal pain, nausea, reflux symptoms, and vomiting Genitourinary:negative for dysuria, frequency, and urinary retention Integument/breast: negative for dryness and rash Hematologic/lymphatic: negative for bleeding and lymphadenopathy Musculoskeletal:negative for back pain, neck pain, and joint pain Neurological: negative for dizziness, tremors, and numbness Endocrine: negative for temperature intolerance  There were no  vitals taken for this visit. Physical Exam Vitals reviewed.  Constitutional:      Appearance: Normal appearance.  HENT:     Head: Normocephalic and atraumatic.  Eyes:     Extraocular Movements: Extraocular movements intact.     Pupils: Pupils are equal, round, and reactive to light.  Cardiovascular:     Rate and Rhythm: Normal rate and regular rhythm.  Pulmonary:     Effort: Pulmonary effort is normal.     Breath sounds: Normal breath sounds.  Abdominal:     General: There is no distension.     Palpations: Abdomen is soft.     Tenderness: There is no abdominal tenderness.   Musculoskeletal:        General: Normal range of motion.     Cervical back: Normal range of motion.  Lymphadenopathy:     Cervical: No cervical adenopathy.  Skin:    General: Skin is warm and dry.  Neurological:     General: No focal deficit present.     Mental Status: He is alert and oriented to person, place, and time.  Psychiatric:        Mood and Affect: Mood normal.        Behavior: Behavior normal.    Results: No results found for this or any previous visit (from the past 48 hour(s)).  No results found.   Assessment & Plan:  Evan [name] is a 66 y.o. male who presents to discus Port-a-cath placement for metastatic colon cancer to the lung.  -The risk and benefits of right IJ Port-a-cath insertion were discussed including but not limited to bleeding, infection, injury to surrounding structures, pneumothorax, and need for additional procedures.  After careful consideration, MICHAIAH HOLSOPPLE has decided to proceed with Port-a-cath insertion.  -Port-A-Cath placement tentatively scheduled for 3/1  All questions were answered to the satisfaction of the patient.   Graciella Freer, DO [facility] [address] [place], [place] [address] [phone] (office)

## 2022-01-31 NOTE — H&P (Signed)
[facility] History and Physical   Reason for Referral: Port-a-cath insertion Referring Physician: Dr. [name] is a 66 y.o. male.  HPI: Patient presents to discuss Port-a-cath insertion.  Patient has a history of colon cancer of the cecum, status post laparoscopic right hemicolectomy in May 2018, and status postchemotherapy treatment.  In January of this year, he was noted to have a left lung mass, and biopsy demonstrates concern for metastatic colon cancer.  He has had increasing shortness of breath and difficulty breathing since that time.  He had noted that he had a decreased appetite over the last 6 months and had 30 pound weight loss in that same time, however, he was awaiting return of his insurance in January, at which time he was evaluated.  For his last cancer treatment, he required bilateral port placements, as his initial port was infected requiring port placement on the other side.  He quit smoking in January after diagnosis of this new lung mass.  He denies use of blood thinning medications, alcohol, and illegal drugs.       Past Medical History:  Diagnosis Date   Arthritis     Colon cancer ([place])      colon   Hepatitis C     Hypertension             Past Surgical History:  Procedure Laterality Date   COLON SURGERY       REPLACEMENT TOTAL KNEE       SHOULDER ARTHROSCOPY       TOTAL HIP ARTHROPLASTY Right 11/09/2020    Procedure: TOTAL HIP ARTHROPLASTY ANTERIOR APPROACH;  Surgeon: Renette Butters, MD;  Location: [facility];  Service: Orthopedics;  Laterality: Right;   WRIST SURGERY Left             Family History  Problem Relation Age of Onset   Heart disease Mother     Dementia Father     Heart disease Sister     Cancer Brother     Hypertension Brother     Hypertension Brother     Healthy Son     Healthy Son     Healthy Son     Healthy Son     Healthy Daughter     Healthy Daughter     Healthy Daughter        Social  History         Tobacco Use   Smoking status: Former      Packs/day: 0.50      Types: Cigarettes      Quit date: 11/19/2005      Years since quitting: 16.2   Smokeless tobacco: Never  Vaping Use   Vaping Use: Never used  Substance Use Topics   Alcohol use: Not Currently   Drug use: Yes      Medications: I have reviewed the patient's current medications. Allergies as of 01/31/2022   No Known Allergies         Medication List           Accurate as of January 31, 2022  9:02 AM. If you have any questions, ask your nurse or doctor.              NAPROXEN PO Take by mouth.               ROS:  Constitutional: negative for chills, fatigue, and fevers Eyes: negative for visual disturbance and pain Ears, nose, mouth,  throat, and face: negative for ear drainage, sore throat, and sinus problems Respiratory: positive for cough, wheezing, and shortness of breath Cardiovascular: negative for chest pain and palpitations Gastrointestinal: negative for abdominal pain, nausea, reflux symptoms, and vomiting Genitourinary:negative for dysuria, frequency, and urinary retention Integument/breast: negative for dryness and rash Hematologic/lymphatic: negative for bleeding and lymphadenopathy Musculoskeletal:negative for back pain, neck pain, and joint pain Neurological: negative for dizziness, tremors, and numbness Endocrine: negative for temperature intolerance   There were no vitals taken for this visit. Physical Exam Vitals reviewed.  Constitutional:      Appearance: Normal appearance.  HENT:     Head: Normocephalic and atraumatic.  Eyes:     Extraocular Movements: Extraocular movements intact.     Pupils: Pupils are equal, round, and reactive to light.  Cardiovascular:     Rate and Rhythm: Normal rate and regular rhythm.  Pulmonary:     Effort: Pulmonary effort is normal.     Breath sounds: Normal breath sounds.  Abdominal:     General: There is no distension.      Palpations: Abdomen is soft.     Tenderness: There is no abdominal tenderness.  Musculoskeletal:        General: Normal range of motion.     Cervical back: Normal range of motion.  Lymphadenopathy:     Cervical: No cervical adenopathy.  Skin:    General: Skin is warm and dry.  Neurological:     General: No focal deficit present.     Mental Status: He is alert and oriented to person, place, and time.  Psychiatric:        Mood and Affect: Mood normal.        Behavior: Behavior normal.      Results: Lab Results Last 48 Hours  No results found for this or any previous visit (from the past 48 hour(s)).     Imaging Results (Last 48 hours)  No results found.       Assessment & Plan:  Evan [name] is a 66 y.o. male who presents to discus Port-a-cath placement for metastatic colon cancer to the lung.   -The risk and benefits of right IJ Port-a-cath insertion were discussed including but not limited to bleeding, infection, injury to surrounding structures, pneumothorax, and need for additional procedures.  After careful consideration, Evan [name] has decided to proceed with Port-a-cath insertion.  -Port-A-Cath placement tentatively scheduled for 3/1   All questions were answered to the satisfaction of the patient.     Graciella Freer, DO [facility] [address] [place], [place] [address] [phone] (office)

## 2022-02-02 ENCOUNTER — Other Ambulatory Visit: Payer: Self-pay

## 2022-02-02 ENCOUNTER — Encounter (HOSPITAL_COMMUNITY): Payer: Self-pay

## 2022-02-02 NOTE — Patient Instructions (Signed)
Evan [name]  02/02/2022     @PREFPERIOPPHARMACY @   Your procedure is scheduled on  02/08/2022.   Report to [facility] at  0930  A.M.   Call this number if you have problems the morning of surgery:  [phone]   Remember:  Do not eat or drink after midnight.      Take these medicines the morning of surgery with A SIP OF WATER                                                None     Do not wear jewelry, make-up or nail polish.  Do not wear lotions, powders, or perfumes, or deodorant.  Do not shave 48 hours prior to surgery.  Men may shave face and neck.  Do not bring valuables to the hospital.  [facility] is not responsible for any belongings or valuables.  Contacts, dentures or bridgework may not be worn into surgery.  Leave your suitcase in the car.  After surgery it may be brought to your room.  For patients admitted to the hospital, discharge time will be determined by your treatment team.  Patients discharged the day of surgery will not be allowed to drive home and must have someone with them for 24 hours.    Special instructions:   DO NOT smoke tobacco or vape for 24 hours before your procedure.  Please read over the following fact sheets that you were given. Coughing and Deep Breathing, Surgical Site Infection Prevention, Anesthesia Post-op Instructions, and Care and Recovery After Surgery      Implanted [facility] Guide An implanted port is a device that is placed under the skin. It is usually placed in the chest. The device may vary based on the need. Implanted ports can be used to give IV medicine, to take blood, or to give fluids. You may have an implanted port if: You need IV medicine that would be irritating to the small veins in your hands or arms. You need IV medicines, such as chemotherapy, for a long period of time. You need IV nutrition for a long period of time. You may have fewer limitations when using a port than you would if you  used other types of long-term IVs. You will also likely be able to return to normal activities after your incision heals. An implanted port has two main parts: Reservoir. The reservoir is the part where a needle is inserted to give medicines or draw blood. The reservoir is round. After the port is placed, it appears as a small, raised area under your skin. Catheter. The catheter is a small, thin tube that connects the reservoir to a vein. Medicine that is inserted into the reservoir goes into the catheter and then into the vein. How is my port accessed? To access your port: A numbing cream may be placed on the skin over the port site. Your health care provider will put on a mask and sterile gloves. The skin over your port will be cleaned carefully with a germ-killing soap and allowed to dry. Your health care provider will gently pinch the port and insert a needle into it. Your health care provider will check for a blood return to make sure the port is in the vein and is still working (  patent). If your port needs to remain accessed to get medicine continuously (constant infusion), your health care provider will place a clear bandage (dressing) over the needle site. The dressing and needle will need to be changed every week, or as told by your health care provider. What is flushing? Flushing helps keep the port working. Follow instructions from your health care provider about how and when to flush the port. Ports are usually flushed with saline solution or a medicine called heparin. The need for flushing will depend on how the port is used: If the port is only used from time to time to give medicines or draw blood, the port may need to be flushed: Before and after medicines have been given. Before and after blood has been drawn. As part of routine maintenance. Flushing may be recommended every 4-6 weeks. If a constant infusion is running, the port may not need to be flushed. Throw away any syringes  in a disposal container that is meant for sharp items (sharps container). You can buy a sharps container from a pharmacy, or you can make one by using an empty hard plastic bottle with a cover. How long will my port stay implanted? The port can stay in for as long as your health care provider thinks it is needed. When it is time for the port to come out, a surgery will be done to remove it. The surgery will be similar to the procedure that was done to put the port in. Follow these instructions at home: Caring for your port and port site Flush your port as told by your health care provider. If you need an infusion over several days, follow instructions from your health care provider about how to take care of your port site. Make sure you: Change your dressing as told by your health care provider. Wash your hands with soap and water for at least 20 seconds before and after you change your dressing. If soap and water are not available, use alcohol-based hand sanitizer. Place any used dressings or infusion bags into a plastic bag. Throw that bag in the trash. Keep the dressing that covers the needle clean and dry. Do not get it wet. Do not use scissors or sharp objects near the infusion tubing. Keep any external tubes clamped, unless they are being used. Check your port site every day for signs of infection. Check for: Redness, swelling, or pain. Fluid or blood. Warmth. Pus or a bad smell. Protect the skin around the port site. Avoid wearing bra straps that rub or irritate the site. Protect the skin around your port from seat belts. Place a soft pad over your chest if needed. Bathe or shower as told by your health care provider. The site may get wet as long as you are not actively receiving an infusion. General instructions  Return to your normal activities as told by your health care provider. Ask your health care provider what activities are safe for you. Carry a medical alert card or wear a  medical alert bracelet at all times. This will let health care providers know that you have an implanted port in case of an emergency. Where to find more information American Cancer Society: www.cancer.[place] of Clinical Oncology: www.cancer.net Contact a health care provider if: You have a fever or chills. You have redness, swelling, or pain at the port site. You have fluid or blood coming from your port site. Your incision feels warm to the touch. You have pus  or a bad smell coming from the port site. Summary Implanted ports are usually placed in the chest for long-term IV access. Follow instructions from your health care provider about flushing the port and changing bandages (dressings). Take care of the area around your port by avoiding clothing that puts pressure on the area, and by watching for signs of infection. Protect the skin around your port from seat belts. Place a soft pad over your chest if needed. Contact a health care provider if you have a fever or you have redness, swelling, pain, fluid, or a bad smell at the port site. This information is not intended to replace advice given to you by your health care provider. Make sure you discuss any questions you have with your health care provider. Document Revised: 05/31/2021 Document Reviewed: 05/31/2021 Elsevier Patient Education  2022 [place] Insert[place], Care After The following information offers guidance on how to care for yourself after your procedure. Your health care provider may also give you more specific instructions. If you have problems or questions, contact your health care provider. What can I expect after the procedure? After the procedure, it is common to have: Discomfort at the port insertion site. Bruising on the skin over the port. This should improve over 3-4 days. Follow these instructions at home: [facility] care After your port is placed, you will get a manufacturer's  information card. The card has information about your port. Keep this card with you at all times. Take care of the port as told by your health care provider. Ask your health care provider if you or a family member can get training for taking care of the port at home. A home health care nurse will be be available to help care for the port. Make sure to remember what type of port you have. Incision care   Follow instructions from your health care provider about how to take care of your port insertion site. Make sure you: Wash your hands with soap and water for at least 20 seconds before and after you change your bandage (dressing). If soap and water are not available, use hand sanitizer. Change your dressing as told by your health care provider. Leave stitches (sutures), skin glue, or adhesive strips in place. These skin closures may need to stay in place for 2 weeks or longer. If adhesive strip edges start to loosen and curl up, you may trim the loose edges. Do not remove adhesive strips completely unless your health care provider tells you to do that. Check your port insertion site every day for signs of infection. Check for: Redness, swelling, or pain. Fluid or blood. Warmth. Pus or a bad smell. Activity Return to your normal activities as told by your health care provider. Ask your health care provider what activities are safe for you. You may have to avoid lifting. Ask your health care provider how much you can safely lift. General instructions Take over-the-counter and prescription medicines only as told by your health care provider. Do not take baths, swim, or use a hot tub until your health care provider approves. Ask your health care provider if you may take showers. You may only be allowed to take sponge baths. If you were given a sedative during the procedure, it can affect you for several hours. Do not drive or operate machinery until your health care provider says that it is  safe. Wear a medical alert bracelet in case of an emergency. This will tell any health  care providers that you have a port. Keep all follow-up visits. This is important. Contact a health care provider if: You cannot flush your port with saline as directed, or you cannot draw blood from the port. You have a fever or chills. You have redness, swelling, or pain around your port insertion site. You have fluid or blood coming from your port insertion site. Your port insertion site feels warm to the touch. You have pus or a bad smell coming from the port insertion site. Get help right away if: You have chest pain or shortness of breath. You have bleeding from your port that you cannot control. These symptoms may be an emergency. Get help right away. Call 911. Do not wait to see if the symptoms will go away. Do not drive yourself to the hospital. Summary Take care of the port as told by your health care provider. Keep the manufacturer's information card with you at all times. Change your dressing as told by your health care provider. Contact a health care provider if you have a fever or chills or if you have redness, swelling, or pain around your port insertion site. Keep all follow-up visits. This information is not intended to replace advice given to you by your health care provider. Make sure you discuss any questions you have with your health care provider. Document Revised: 05/31/2021 Document Reviewed: 05/31/2021 Elsevier Patient Education  [place] After This sheet gives you information about how to care for yourself after your procedure. Your health care provider may also give you more specific instructions. If you have problems or questions, contact your health care provider. What can I expect after the procedure? After the procedure, it is common to have: Tiredness. Forgetfulness about what happened after the procedure. Impaired judgment for  important decisions. Nausea or vomiting. Some difficulty with balance. Follow these instructions at home: For the time period you were told by your health care provider:   Rest as needed. Do not participate in activities where you could fall or become injured. Do not drive or use machinery. Do not drink alcohol. Do not take sleeping pills or medicines that cause drowsiness. Do not make important decisions or sign legal documents. Do not take care of children on your own. Eating and drinking Follow the diet that is recommended by your health care provider. Drink enough fluid to keep your urine pale yellow. If you vomit: Drink water, juice, or soup when you can drink without vomiting. Make sure you have little or no nausea before eating solid foods. General instructions Have a responsible adult stay with you for the time you are told. It is important to have someone help care for you until you are awake and alert. Take over-the-counter and prescription medicines only as told by your health care provider. If you have sleep apnea, surgery and certain medicines can increase your risk for breathing problems. Follow instructions from your health care provider about wearing your sleep device: Anytime you are sleeping, including during daytime naps. While taking prescription pain medicines, sleeping medicines, or medicines that make you drowsy. Avoid smoking. Keep all follow-up visits as told by your health care provider. This is important. Contact a health care provider if: You keep feeling nauseous or you keep vomiting. You feel light-headed. You are still sleepy or having trouble with balance after 24 hours. You develop a rash. You have a fever. You have redness or swelling around the IV site. Get help right  away if: You have trouble breathing. You have new-onset confusion at home. Summary For several hours after your procedure, you may feel tired. You may also be forgetful and have  poor judgment. Have a responsible adult stay with you for the time you are told. It is important to have someone help care for you until you are awake and alert. Rest as told. Do not drive or operate machinery. Do not drink alcohol or take sleeping pills. Get help right away if you have trouble breathing, or if you suddenly become confused. This information is not intended to replace advice given to you by your health care provider. Make sure you discuss any questions you have with your health care provider. Document Revised: 08/12/2020 Document Reviewed: 10/30/2019 Elsevier Patient Education  2022 [place]. How to Use Chlorhexidine for Bathing Chlorhexidine gluconate (CHG) is a germ-killing (antiseptic) solution that is used to clean the skin. It can get rid of the bacteria that normally live on the skin and can keep them away for about 24 hours. To clean your skin with CHG, you may be given: A CHG solution to use in the shower or as part of a sponge bath. A prepackaged cloth that contains CHG. Cleaning your skin with CHG may help lower the risk for infection: While you are staying in the intensive care unit of the hospital. If you have a vascular access, such as a central line, to provide short-term or long-term access to your veins. If you have a catheter to drain urine from your bladder. If you are on a ventilator. A ventilator is a machine that helps you breathe by moving air in and out of your lungs. After surgery. What are the risks? Risks of using CHG include: A skin reaction. Hearing loss, if CHG gets in your ears and you have a perforated eardrum. Eye injury, if CHG gets in your eyes and is not rinsed out. The CHG product catching fire. Make sure that you avoid smoking and flames after applying CHG to your skin. Do not use CHG: If you have a chlorhexidine allergy or have previously reacted to chlorhexidine. On babies younger than 30 months of age. How to use CHG solution Use  CHG only as told by your health care provider, and follow the instructions on the label. Use the full amount of CHG as directed. Usually, this is one bottle. During a shower Follow these steps when using CHG solution during a shower (unless your health care provider gives you different instructions): Start the shower. Use your normal soap and shampoo to wash your face and hair. Turn off the shower or move out of the shower stream. Pour the CHG onto a clean washcloth. Do not use any type of brush or rough-edged sponge. Starting at your neck, lather your body down to your toes. Make sure you follow these instructions: If you will be having surgery, pay special attention to the part of your body where you will be having surgery. Scrub this area for at least 1 minute. Do not use CHG on your head or face. If the solution gets into your ears or eyes, rinse them well with water. Avoid your genital area. Avoid any areas of skin that have broken skin, cuts, or scrapes. Scrub your back and under your arms. Make sure to wash skin folds. Let the lather sit on your skin for 1-2 minutes or as long as told by your health care provider. Thoroughly rinse your entire body in the shower.  Make sure that all body creases and crevices are rinsed well. Dry off with a clean towel. Do not put any substances on your body afterward--such as powder, lotion, or perfume--unless you are told to do so by your health care provider. Only use lotions that are recommended by the manufacturer. Put on clean clothes or pajamas. If it is the night before your surgery, sleep in clean sheets.  During a sponge bath Follow these steps when using CHG solution during a sponge bath (unless your health care provider gives you different instructions): Use your normal soap and shampoo to wash your face and hair. Pour the CHG onto a clean washcloth. Starting at your neck, lather your body down to your toes. Make sure you follow these  instructions: If you will be having surgery, pay special attention to the part of your body where you will be having surgery. Scrub this area for at least 1 minute. Do not use CHG on your head or face. If the solution gets into your ears or eyes, rinse them well with water. Avoid your genital area. Avoid any areas of skin that have broken skin, cuts, or scrapes. Scrub your back and under your arms. Make sure to wash skin folds. Let the lather sit on your skin for 1-2 minutes or as long as told by your health care provider. Using a different clean, wet washcloth, thoroughly rinse your entire body. Make sure that all body creases and crevices are rinsed well. Dry off with a clean towel. Do not put any substances on your body afterward--such as powder, lotion, or perfume--unless you are told to do so by your health care provider. Only use lotions that are recommended by the manufacturer. Put on clean clothes or pajamas. If it is the night before your surgery, sleep in clean sheets. How to use CHG prepackaged cloths Only use CHG cloths as told by your health care provider, and follow the instructions on the label. Use the CHG cloth on clean, dry skin. Do not use the CHG cloth on your head or face unless your health care provider tells you to. When washing with the CHG cloth: Avoid your genital area. Avoid any areas of skin that have broken skin, cuts, or scrapes. Before surgery Follow these steps when using a CHG cloth to clean before surgery (unless your health care provider gives you different instructions): Using the CHG cloth, vigorously scrub the part of your body where you will be having surgery. Scrub using a back-and-forth motion for 3 minutes. The area on your body should be completely wet with CHG when you are done scrubbing. Do not rinse. Discard the cloth and let the area air-dry. Do not put any substances on the area afterward, such as powder, lotion, or perfume. Put on clean clothes  or pajamas. If it is the night before your surgery, sleep in clean sheets.  For general bathing Follow these steps when using CHG cloths for general bathing (unless your health care provider gives you different instructions). Use a separate CHG cloth for each area of your body. Make sure you wash between any folds of skin and between your fingers and toes. Wash your body in the following order, switching to a new cloth after each step: The front of your neck, shoulders, and chest. Both of your arms, under your arms, and your hands. Your stomach and groin area, avoiding the genitals. Your right leg and foot. Your left leg and foot. The back of your neck, your  back, and your buttocks. Do not rinse. Discard the cloth and let the area air-dry. Do not put any substances on your body afterward--such as powder, lotion, or perfume--unless you are told to do so by your health care provider. Only use lotions that are recommended by the manufacturer. Put on clean clothes or pajamas. Contact a health care provider if: Your skin gets irritated after scrubbing. You have questions about using your solution or cloth. You swallow any chlorhexidine. Call your local poison control center (1-845 367 3117 in the [place] Get help right away if: Your eyes itch badly, or they become very red or swollen. Your skin itches badly and is red or swollen. Your hearing changes. You have trouble seeing. You have swelling or tingling in your mouth or throat. You have trouble breathing. These symptoms may represent a serious problem that is an emergency. Do not wait to see if the symptoms will go away. Get medical help right away. Call your local emergency services (911 in the [place] Do not drive yourself to the hospital. Summary Chlorhexidine gluconate (CHG) is a germ-killing (antiseptic) solution that is used to clean the skin. Cleaning your skin with CHG may help to lower your risk for infection. You may be given CHG to  use for bathing. It may be in a bottle or in a prepackaged cloth to use on your skin. Carefully follow your health care provider's instructions and the instructions on the product label. Do not use CHG if you have a chlorhexidine allergy. Contact your health care provider if your skin gets irritated after scrubbing. This information is not intended to replace advice given to you by your health care provider. Make sure you discuss any questions you have with your health care provider. Document Revised: 02/07/2021 Document Reviewed: 02/07/2021 Elsevier Patient Education  2022 [employer].

## 2022-02-03 ENCOUNTER — Encounter (HOSPITAL_COMMUNITY)
Admission: RE | Admit: 2022-02-03 | Discharge: 2022-02-03 | Disposition: A | Discharge status: Home / Self Care | Payer: 59 | Source: Ambulatory Visit | Attending: Surgery | Admitting: Surgery

## 2022-02-06 ENCOUNTER — Ambulatory Visit (HOSPITAL_COMMUNITY): Payer: 59 | Admitting: Hematology

## 2022-02-08 ENCOUNTER — Encounter (HOSPITAL_COMMUNITY): Payer: Self-pay | Admitting: Surgery

## 2022-02-08 ENCOUNTER — Encounter (HOSPITAL_COMMUNITY)
Admission: RE | Disposition: A | Discharge status: Home / Self Care | Payer: Self-pay | Source: Ambulatory Visit | Attending: Surgery

## 2022-02-08 ENCOUNTER — Ambulatory Visit (HOSPITAL_BASED_OUTPATIENT_CLINIC_OR_DEPARTMENT_OTHER): Payer: 59 | Admitting: Certified Registered Nurse Anesthetist

## 2022-02-08 ENCOUNTER — Other Ambulatory Visit: Payer: Self-pay

## 2022-02-08 ENCOUNTER — Ambulatory Visit (HOSPITAL_COMMUNITY): Payer: 59 | Admitting: Certified Registered Nurse Anesthetist

## 2022-02-08 ENCOUNTER — Ambulatory Visit (HOSPITAL_COMMUNITY): Payer: 59

## 2022-02-08 ENCOUNTER — Ambulatory Visit (HOSPITAL_COMMUNITY)
Admission: RE | Admit: 2022-02-08 | Discharge: 2022-02-08 | Disposition: A | Discharge status: Home / Self Care | Payer: 59 | Source: Ambulatory Visit | Attending: Surgery | Admitting: Surgery

## 2022-02-08 DIAGNOSIS — C180 Malignant neoplasm of cecum: Secondary | ICD-10-CM | POA: Insufficient documentation

## 2022-02-08 DIAGNOSIS — C7802 Secondary malignant neoplasm of left lung: Secondary | ICD-10-CM | POA: Insufficient documentation

## 2022-02-08 DIAGNOSIS — M1990 Unspecified osteoarthritis, unspecified site: Secondary | ICD-10-CM | POA: Diagnosis not present

## 2022-02-08 DIAGNOSIS — I10 Essential (primary) hypertension: Secondary | ICD-10-CM | POA: Insufficient documentation

## 2022-02-08 DIAGNOSIS — Z9221 Personal history of antineoplastic chemotherapy: Secondary | ICD-10-CM | POA: Insufficient documentation

## 2022-02-08 DIAGNOSIS — Z95828 Presence of other vascular implants and grafts: Secondary | ICD-10-CM

## 2022-02-08 DIAGNOSIS — Z85038 Personal history of other malignant neoplasm of large intestine: Secondary | ICD-10-CM | POA: Diagnosis not present

## 2022-02-08 DIAGNOSIS — R918 Other nonspecific abnormal finding of lung field: Secondary | ICD-10-CM | POA: Diagnosis not present

## 2022-02-08 DIAGNOSIS — C189 Malignant neoplasm of colon, unspecified: Secondary | ICD-10-CM | POA: Diagnosis present

## 2022-02-08 DIAGNOSIS — Z87891 Personal history of nicotine dependence: Secondary | ICD-10-CM | POA: Diagnosis not present

## 2022-02-08 DIAGNOSIS — R0602 Shortness of breath: Secondary | ICD-10-CM | POA: Diagnosis not present

## 2022-02-08 DIAGNOSIS — Z9049 Acquired absence of other specified parts of digestive tract: Secondary | ICD-10-CM | POA: Insufficient documentation

## 2022-02-08 HISTORY — PX: PORTACATH PLACEMENT: SHX2246

## 2022-02-08 SURGERY — INSERTION, TUNNELED CENTRAL VENOUS DEVICE, WITH PORT
Anesthesia: General | Site: Chest | Laterality: Right

## 2022-02-08 MED ORDER — HEPARIN SOD (PORK) LOCK FLUSH 100 UNIT/ML IV SOLN
INTRAVENOUS | Status: AC
  Filled 2022-02-08: qty 5

## 2022-02-08 MED ORDER — CHLORHEXIDINE GLUCONATE CLOTH 2 % EX PADS: 6.0000 | MEDICATED_PAD | Freq: Once | CUTANEOUS | Status: DC

## 2022-02-08 MED ORDER — LIDOCAINE HCL (PF) 1 % IJ SOLN
INTRAMUSCULAR | Status: AC
  Filled 2022-02-08: qty 30

## 2022-02-08 MED ORDER — CEFAZOLIN SODIUM-DEXTROSE 2-4 GM/100ML-% IV SOLN
2.0000 g | INTRAVENOUS | Status: AC
  Administered 2022-02-08: 2 g via INTRAVENOUS

## 2022-02-08 MED ORDER — LACTATED RINGERS IV SOLN: INTRAVENOUS | Status: DC

## 2022-02-08 MED ORDER — CHLORHEXIDINE GLUCONATE 0.12 % MT SOLN
15.0000 mL | Freq: Once | OROMUCOSAL | Status: AC
  Administered 2022-02-08: 15 mL via OROMUCOSAL

## 2022-02-08 MED ORDER — PROPOFOL 10 MG/ML IV BOLUS
INTRAVENOUS | Status: AC
Start: 2022-02-08 — End: ?
  Filled 2022-02-08: qty 20

## 2022-02-08 MED ORDER — FENTANYL CITRATE (PF) 100 MCG/2ML IJ SOLN
INTRAMUSCULAR | Status: DC | PRN
  Administered 2022-02-08 (×3): 25 ug via INTRAVENOUS

## 2022-02-08 MED ORDER — FENTANYL CITRATE (PF) 100 MCG/2ML IJ SOLN
INTRAMUSCULAR | Status: AC
Start: 2022-02-08 — End: ?
  Filled 2022-02-08: qty 2

## 2022-02-08 MED ORDER — OXYCODONE HCL 5 MG PO TABS
5.0000 mg | ORAL_TABLET | Freq: Three times a day (TID) | ORAL | 0 refills | Status: DC | PRN
Start: 2022-02-08 — End: 2022-04-04

## 2022-02-08 MED ORDER — PROPOFOL 10 MG/ML IV BOLUS
INTRAVENOUS | Status: DC | PRN
  Administered 2022-02-08: 30 mg via INTRAVENOUS

## 2022-02-08 MED ORDER — HEPARIN SOD (PORK) LOCK FLUSH 100 UNIT/ML IV SOLN
INTRAVENOUS | Status: DC | PRN
Start: 2022-02-08 — End: 2022-02-08
  Administered 2022-02-08: 300 [IU] via INTRAVENOUS

## 2022-02-08 MED ORDER — DOCUSATE SODIUM 100 MG PO CAPS: 100.0000 mg | ORAL_CAPSULE | Freq: Two times a day (BID) | ORAL | 0 refills | Status: AC

## 2022-02-08 MED ORDER — MIDAZOLAM HCL 2 MG/2ML IJ SOLN
INTRAMUSCULAR | Status: DC | PRN
  Administered 2022-02-08: 2 mg via INTRAVENOUS

## 2022-02-08 MED ORDER — MIDAZOLAM HCL 2 MG/2ML IJ SOLN
INTRAMUSCULAR | Status: AC
  Filled 2022-02-08: qty 2

## 2022-02-08 MED ORDER — LIDOCAINE HCL (PF) 1 % IJ SOLN
INTRAMUSCULAR | Status: DC | PRN
  Administered 2022-02-08: 13 mL

## 2022-02-08 MED ORDER — HYDROMORPHONE HCL 1 MG/ML IJ SOLN: 0.2500 mg | INTRAMUSCULAR | Status: DC | PRN

## 2022-02-08 MED ORDER — PROPOFOL 500 MG/50ML IV EMUL
INTRAVENOUS | Status: DC | PRN
  Administered 2022-02-08: 50 ug/kg/min via INTRAVENOUS

## 2022-02-08 MED ORDER — ONDANSETRON HCL 4 MG/2ML IJ SOLN: 4.0000 mg | Freq: Once | INTRAMUSCULAR | Status: DC | PRN

## 2022-02-08 MED ORDER — ORAL CARE MOUTH RINSE: 15.0000 mL | Freq: Once | OROMUCOSAL | Status: AC

## 2022-02-08 MED ORDER — SODIUM CHLORIDE (PF) 0.9 % IJ SOLN
INTRAMUSCULAR | Status: DC | PRN
  Administered 2022-02-08: 10 mL via INTRAVENOUS

## 2022-02-08 MED ORDER — CEFAZOLIN SODIUM-DEXTROSE 2-4 GM/100ML-% IV SOLN
INTRAVENOUS | Status: AC
  Filled 2022-02-08: qty 100

## 2022-02-08 MED ORDER — ACETAMINOPHEN 500 MG PO TABS: 1000.0000 mg | ORAL_TABLET | Freq: Four times a day (QID) | ORAL | 0 refills | Status: AC

## 2022-02-08 SURGICAL SUPPLY — 32 items
ADH SKN CLS APL DERMABOND .7 (GAUZE/BANDAGES/DRESSINGS) ×1
APL PRP STRL LF ISPRP CHG 10.5 (MISCELLANEOUS) ×1
APPLICATOR CHLORAPREP 10.5 ORG (MISCELLANEOUS) ×3 IMPLANT
BAG DECANTER FOR FLEXI CONT (MISCELLANEOUS) ×3 IMPLANT
CLOTH BEACON ORANGE TIMEOUT ST (SAFETY) ×3 IMPLANT
COVER LIGHT HANDLE STERIS (MISCELLANEOUS) ×6 IMPLANT
COVER PROBE U/S 5X48 (MISCELLANEOUS) ×3 IMPLANT
DECANTER SPIKE VIAL GLASS SM (MISCELLANEOUS) ×3 IMPLANT
DERMABOND ADVANCED (GAUZE/BANDAGES/DRESSINGS) ×1
DERMABOND ADVANCED .7 DNX12 (GAUZE/BANDAGES/DRESSINGS) ×2 IMPLANT
DRAPE C-ARM FOLDED MOBILE STRL (DRAPES) ×3 IMPLANT
ELECT REM PT RETURN 9FT ADLT (ELECTROSURGICAL) ×2
ELECTRODE REM PT RTRN 9FT ADLT (ELECTROSURGICAL) ×2 IMPLANT
GAUZE 4X4 16PLY ~~LOC~~+RFID DBL (SPONGE) ×1 IMPLANT
GLOVE SURG ENC MOIS LTX SZ6.5 (GLOVE) ×3 IMPLANT
GLOVE SURG UNDER POLY LF SZ7 (GLOVE) ×6 IMPLANT
GOWN STRL REUS W/TWL LRG LVL3 (GOWN DISPOSABLE) ×6 IMPLANT
IV NS 500ML (IV SOLUTION) ×2
IV NS 500ML BAXH (IV SOLUTION) ×2 IMPLANT
KIT PORT POWER 8FR ISP MRI (Port) ×3 IMPLANT
KIT TURNOVER KIT A (KITS) ×3 IMPLANT
MANIFOLD NEPTUNE II (INSTRUMENTS) ×3 IMPLANT
NDL HYPO 25X1 1.5 SAFETY (NEEDLE) ×2 IMPLANT
NEEDLE HYPO 25X1 1.5 SAFETY (NEEDLE) ×2 IMPLANT
PACK MINOR (CUSTOM PROCEDURE TRAY) ×3 IMPLANT
PAD ARMBOARD 7.5X6 YLW CONV (MISCELLANEOUS) ×3 IMPLANT
SET BASIN LINEN APH (SET/KITS/TRAYS/PACK) ×3 IMPLANT
SUT MNCRL AB 4-0 PS2 18 (SUTURE) ×3 IMPLANT
SUT VIC AB 3-0 SH 27 (SUTURE) ×2
SUT VIC AB 3-0 SH 27X BRD (SUTURE) ×2 IMPLANT
SYR 10ML LL (SYRINGE) ×6 IMPLANT
SYR CONTROL 10ML LL (SYRINGE) ×3 IMPLANT

## 2022-02-08 NOTE — Progress Notes (Signed)
Update Note: ? ?Patient's wife, Kenichi Cassada, to update her about the surgery.  It was explained that he did well, and the Port-a-cath was able to be placed without significant issue.  He will get a CXR in PACU to verify the catheter is in the correct position.  I explained that I discharged him with some pain medications and that I recommend that he take scheduled Tylenol for a few days after the surgery.  It was further explained that he will have a phone follow up with me in 2 weeks.  All questions were answered to her expressed satisfaction. ? ?Graciella Freer, DO ?[facility] ?[place], [place] [address] ?[phone] (office) ? ?

## 2022-02-08 NOTE — Transfer of Care (Signed)
Immediate Anesthesia Transfer of Care Note ? ?Patient: Evan [name] ? ?Procedure(s) Performed: INSERTION PORT-A-CATH- RIJ (Right: Chest) ? ?Patient Location: Short Stay ? ?Anesthesia Type:General ? ?Level of Consciousness: awake, alert  and oriented ? ?Airway & Oxygen Therapy: Patient Spontanous Breathing ? ?Post-op Assessment: Report given to RN and Post -op Vital signs reviewed and stable ? ?Post vital signs: Reviewed and stable ? ?Last Vitals:  ?Vitals Value Taken Time  ?BP    ?Temp    ?Pulse    ?Resp    ?SpO2    ? ? ?Last Pain:  ?Vitals:  ? 02/08/22 0831  ?TempSrc: Oral  ?PainSc: 0-No pain  ?   ? ?Patients Stated Pain Goal: 5 (02/08/22 0831) ? ?Complications: No notable events documented. ?

## 2022-02-08 NOTE — Anesthesia Postprocedure Evaluation (Signed)
Anesthesia Post Note ? ?Patient: Evan [name] ? ?Procedure(s) Performed: INSERTION PORT-A-CATH- RIJ (Right: Chest) ? ?Patient location during evaluation: Phase II ?Anesthesia Type: General ?Level of consciousness: awake and alert and oriented ?Pain management: pain level controlled ?Vital Signs Assessment: post-procedure vital signs reviewed and stable ?Respiratory status: spontaneous breathing, nonlabored ventilation and respiratory function stable ?Cardiovascular status: blood pressure returned to baseline and stable ?Postop Assessment: no apparent nausea or vomiting ?Anesthetic complications: no ? ? ?No notable events documented. ? ? ?Last Vitals:  ?Vitals:  ? 02/08/22 0831 02/08/22 1116  ?BP: 114/79 93/71  ?Pulse: 67 (!) 58  ?Resp: 15 17  ?Temp: (!) 36.4 ?C 36.8 ?C  ?SpO2: 97% 94%  ?  ?Last Pain:  ?Vitals:  ? 02/08/22 1116  ?TempSrc: Oral  ?PainSc: 0-No pain  ? ? ?  ?  ?  ?  ?  ?  ? ?Tanelle Lanzo C Shalla Bulluck ? ? ? ? ?

## 2022-02-08 NOTE — Op Note (Signed)
Operative Note ?02/08/22 ?  ?Preoperative Diagnosis: Metastatic colon adenocarcinoma ?  ?Postoperative Diagnosis: Same ?  ?Procedure(s) Performed: Right IJ Port-A-Cath placement under ultrasound guidance ?  ?Surgeon: Graciella Freer, DO  ?  ?Assistants: No qualified resident was available ?  ?Anesthesia: Monitored anesthesia care ?  ?Anesthesiologist: Denese Killings, MD  ?  ?Specimens: None ?  ?Estimated Blood Loss: Minimal  ? ?Fluoroscopy time: 31 seconds ?  ?Blood Replacement: None  ?  ?Complications: None  ?  ?Operative Findings: Right IJ Port-a-cath in appropriate position ? ?Indications: Patient is a 66 year old male who presents for R IJ Port-a-cath insertion.  He had been previously treated for right colon cancer, and he was noted to have a new left lung mass on recent imaging. Biopsy confirms metastatic adenocarcinoma.  He is agreeable to Port-a-cath placement. ? ?All risks, benefits, and alternatives to Right IJ Port-a-cath insertion were discussed with the patient, all of his questions were answered to his expressed satisfaction. The patient expresses he wishes to proceed, and informed consent was obtained. ? ?Procedure: The patient was brought into the operating room and was induced.  One percent lidocaine was used for local anesthesia throughout the procedure.   The right chest and neck was prepped and draped in the usual sterile fashion.  Preoperative antibiotics given.  Using ultrasound guidance, the right neck was localized.  Again using ultrasound guidance, the access needle was advanced into the right internal jugular vein using the Seldinger technique without difficulty. A guidewire was then advanced into the right atrium under fluoroscopic guidance.  Ectopia was not noted. The wire was confirmed to be in the vein with ultrasound.  The skin was knicked.  An incision was made below the right clavicle, lateral to previous port surgical site.  A subcutaneous pocket was formed. The catheter  was then tunneled from the pocket to the access site in the neck.  An introducer and peel-away sheath were placed over the guidewire. The catheter was then inserted through the peel-away sheath and the peel-away sheath was removed.  A spot film was performed to confirm the position. The catheter was then attached to the port and the port placed in subcutaneous pocket. Adequate positioning was confirmed by fluoroscopy. Hemostasis was confirmed.  Good backflow of blood was noted on aspiration of the port. The port was flushed with heparin flush. Subcutaneous layer was reapproximated using a 3-0 Vicryl interrupted suture. The skin was closed using a 4-0 Vicryl subcuticular suture. Dermabond was applied. ?   ?All tape and needle counts were correct at the end of the procedure. The patient was transferred to PACU in stable condition. A chest x-ray will be performed at that time. ? ?Graciella Freer, DO  ?[facility] ?[place], [place] [address] ?[phone] (office) ? ? ? ? ?

## 2022-02-08 NOTE — Progress Notes (Signed)
[facility] radiology called to give results of CXR to RN Recommending advancement. Dr. Okey Dupre made aware. Dr. Okey Dupre reviewed CXR and report. Recommending pt be discharged.  ?

## 2022-02-08 NOTE — Anesthesia Preprocedure Evaluation (Signed)
Anesthesia Evaluation  ?Patient identified by MRN, date of birth, ID band ?Patient awake ? ? ? ?Reviewed: ?Allergy & Precautions, NPO status , Patient's Chart, lab work & pertinent test results ? ?Airway ?Mallampati: II ? ?TM Distance: >3 FB ?Neck ROM: Full ? ? ? Dental ? ?(+) Dental Advisory Given, Partial Upper, Missing ?  ?Pulmonary ?shortness of breath and with exertion, former smoker,  ?Left lung cancer ?  ?Pulmonary exam normal ?breath sounds clear to auscultation ? ? ? ? ? ? Cardiovascular ?Exercise Tolerance: Good ?hypertension, Pt. on medications ?Normal cardiovascular exam ?Rhythm:Regular Rate:Normal ? ? ?  ?Neuro/Psych ?  ? GI/Hepatic ?(+) Hepatitis -, CMetastatic Colon cancer ?  ?Endo/Other  ?negative endocrine ROS ? Renal/GU ?negative Renal ROS  ? ?  ?Musculoskeletal ? ?(+) Arthritis ,  ? Abdominal ?  ?Peds ? Hematology ?negative hematology ROS ?(+)   ?Anesthesia Other Findings ? ? Reproductive/Obstetrics ? ?  ? ? ? ? ? ? ? ? ? ? ? ? ? ?  ?  ? ? ? ? ? ? ? ?Anesthesia Physical ?Anesthesia Plan ? ?ASA: 3 ? ?Anesthesia Plan: General  ? ?Post-op Pain Management: Minimal or no pain anticipated  ? ?Induction: Intravenous ? ?PONV Risk Score and Plan: 3 and Ondansetron and TIVA ? ?Airway Management Planned: Nasal Cannula, Natural Airway and Simple Face Mask ? ?Additional Equipment:  ? ?Intra-op Plan:  ? ?Post-operative Plan:  ? ?Informed Consent: I have reviewed the patients History and Physical, chart, labs and discussed the procedure including the risks, benefits and alternatives for the proposed anesthesia with the patient or authorized representative who has indicated his/her understanding and acceptance.  ? ? ? ?Dental advisory given ? ?Plan Discussed with: CRNA and Surgeon ? ?Anesthesia Plan Comments:   ? ? ? ? ? ?Anesthesia Quick Evaluation ? ?

## 2022-02-08 NOTE — Discharge Instructions (Signed)
Ambulatory Surgery Discharge Instructions  General Anesthesia or Sedation Do not drive or operate heavy machinery for 24 hours.  Do not consume alcohol, tranquilizers, sleeping medications, or any non-prescribed medications for 24 hours. Do not make important decisions or sign any important papers in the next 24 hours. You should have someone with you tonight at home.  Activity  You are advised to go directly home from the hospital.  Restrict your activities and rest for a day.  Resume light activity tomorrow. No heavy lifting over 10 lbs or strenuous exercise.  Fluids and Diet Begin with clear liquids, bouillon, dry toast, soda crackers.  If not nauseated, you may go to a regular diet when you desire.  Greasy and spicy foods are not advised.  Medications  If you have not had a bowel movement in 24 hours, take 2 tablespoons over the counter Milk of mag.             You May resume your blood thinners tomorrow (Aspirin, coumadin, or other).  You are being discharged with prescriptions for Opioid/Narcotic Medications: There are some specific considerations for these medications that you should know. Opioid Meds have risks & benefits. Addiction to these meds is always a concern with prolonged use Take medication only as directed Do not drive while taking narcotic pain medication Do not crush tablets or capsules Do not use a different container than medication was dispensed in Lock the container of medication in a cool, dry place out of reach of children and pets. Opioid medication can cause addiction Do not share with anyone else (this is a felony) Do not store medications for future use. Dispose of them properly.     Disposal:  Find a [place] household drug take back site near you.  If you can't get to a drug take back site, use the recipe below as a last resort to dispose of expired, unused or unwanted drugs. Disposal  (Do not dispose chemotherapy drugs this way, talk to your  prescribing doctor instead.) Step 1: Mix drugs (do not crush) with dirt, kitty litter, or used coffee grounds and add a small amount of water to dissolve any solid medications. Step 2: Seal drugs in plastic bag. Step 3: Place plastic bag in trash. Step 4: Take prescription container and scratch out personal information, then recycle or throw away.  Operative Site  You have a liquid bandage over your incisions, this will begin to flake off in about a week. Ok to shower tomorrow. Keep wound clean and dry. No baths or swimming. No lifting more than 10 pounds.  Contact Information: If you have questions or concerns, please call our office, [phone], Monday- Thursday 8AM-5PM and Friday 8AM-12Noon.  If it is after hours or on the weekend, please call [facility] Main Number, [phone], and ask to speak to the surgeon on call for Dr. Punam Broussard at [place].   SPECIFIC COMPLICATIONS TO WATCH FOR: Inability to urinate Fever over 101? F by mouth Nausea and vomiting lasting longer than 24 hours. Pain not relieved by medication ordered Swelling around the operative site Increased redness, warmth, hardness, around operative area Numbness, tingling, or cold fingers or toes Blood -soaked dressing, (small amounts of oozing may be normal) Increasing and progressive drainage from surgical area or exam site  

## 2022-02-08 NOTE — Interval H&P Note (Signed)
History and Physical Interval Note: ? ?02/08/2022 ?9:22 AM ? ?Evan [name]  has presented today for surgery, with the diagnosis of Stage IV Colon Cancer, Metastatic Cancer (Lung).  The various methods of treatment have been discussed with the patient and family. After consideration of risks, benefits and other options for treatment, the patient has consented to  Procedure(s) with comments: ?INSERTION PORT-A-CATH- RIJ (Right) - pt knows to arrive at 8:15 as a surgical intervention.  The patient's history has been reviewed, patient examined, no change in status, stable for surgery.  I have reviewed the patient's chart and labs.  Questions were answered to the patient's satisfaction.   ? ? ?Baron Parmelee A Yechiel Erny ? ? ?

## 2022-02-13 ENCOUNTER — Encounter (HOSPITAL_COMMUNITY): Payer: Self-pay

## 2022-02-13 ENCOUNTER — Encounter (HOSPITAL_COMMUNITY): Payer: Self-pay | Admitting: Surgery

## 2022-02-15 ENCOUNTER — Other Ambulatory Visit: Payer: Self-pay

## 2022-02-15 ENCOUNTER — Encounter (HOSPITAL_COMMUNITY): Payer: Self-pay | Admitting: Hematology

## 2022-02-15 ENCOUNTER — Inpatient Hospital Stay (HOSPITAL_COMMUNITY): Payer: 59 | Attending: Hematology | Admitting: Hematology

## 2022-02-15 VITALS — BP 108/69 | HR 77 | Temp 97.8°F | Resp 18 | Ht 71.0 in | Wt 173.1 lb

## 2022-02-15 DIAGNOSIS — C3490 Malignant neoplasm of unspecified part of unspecified bronchus or lung: Secondary | ICD-10-CM

## 2022-02-15 DIAGNOSIS — C7801 Secondary malignant neoplasm of right lung: Secondary | ICD-10-CM | POA: Insufficient documentation

## 2022-02-15 DIAGNOSIS — R042 Hemoptysis: Secondary | ICD-10-CM | POA: Insufficient documentation

## 2022-02-15 DIAGNOSIS — C7972 Secondary malignant neoplasm of left adrenal gland: Secondary | ICD-10-CM | POA: Diagnosis not present

## 2022-02-15 DIAGNOSIS — R1013 Epigastric pain: Secondary | ICD-10-CM | POA: Insufficient documentation

## 2022-02-15 DIAGNOSIS — Z87891 Personal history of nicotine dependence: Secondary | ICD-10-CM | POA: Diagnosis not present

## 2022-02-15 DIAGNOSIS — R059 Cough, unspecified: Secondary | ICD-10-CM | POA: Diagnosis not present

## 2022-02-15 DIAGNOSIS — Z95828 Presence of other vascular implants and grafts: Secondary | ICD-10-CM

## 2022-02-15 DIAGNOSIS — C189 Malignant neoplasm of colon, unspecified: Secondary | ICD-10-CM

## 2022-02-15 DIAGNOSIS — Z5111 Encounter for antineoplastic chemotherapy: Secondary | ICD-10-CM | POA: Diagnosis present

## 2022-02-15 DIAGNOSIS — C180 Malignant neoplasm of cecum: Secondary | ICD-10-CM | POA: Diagnosis present

## 2022-02-15 DIAGNOSIS — C7802 Secondary malignant neoplasm of left lung: Secondary | ICD-10-CM | POA: Diagnosis not present

## 2022-02-15 HISTORY — DX: Presence of other vascular implants and grafts: Z95.828

## 2022-02-15 MED ORDER — PROCHLORPERAZINE MALEATE 10 MG PO TABS: 10.0000 mg | ORAL_TABLET | Freq: Four times a day (QID) | ORAL | 1 refills | Status: DC | PRN

## 2022-02-15 MED ORDER — LIDOCAINE-PRILOCAINE 2.5-2.5 % EX CREA: TOPICAL_CREAM | CUTANEOUS | 3 refills | Status: DC

## 2022-02-15 NOTE — Progress Notes (Signed)
[address] [place], [place] [address]   CLINIC:  Medical Oncology/Hematology  PCP:  Lavella Lemons, PA [address] 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[place])   10/07/2019 Cancer Staging   Staging form: Colon and Rectum, AJCC 8th Edition - Clinical stage from 10/07/2019: Stage IIIB (cT3, cN1, cM0) - Signed by Derek Jack, MD on 10/07/2019    02/20/2022 -  Chemotherapy   Patient is on Treatment Plan : COLORECTAL FOLFIRI / BEVACIZUMAB Q14D       CANCER STAGING: Cancer Staging  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, a 66 y.o. male, returns for routine follow-up of his left lung mass. Evan [name] was last seen on 01/23/2022.   Today he reports feeling well. He reports CP while coughing; his cough and SOB are stable. He reports constipation. He also reports hemoptysis.   REVIEW OF SYSTEMS:  Review of Systems  Constitutional:  Negative for appetite change and fatigue.  Respiratory:  Positive for cough, hemoptysis and shortness of breath.   Cardiovascular:  Positive for chest pain.  Gastrointestinal:  Positive for constipation and vomiting.  Neurological:  Positive for headaches.  All other systems reviewed and are negative.  PAST MEDICAL/SURGICAL  HISTORY:  Past Medical History:  Diagnosis Date   Arthritis    Colon cancer ([place])    colon   Hepatitis C    Hypertension    Past Surgical History:  Procedure Laterality Date   COLON SURGERY     PORTACATH PLACEMENT Right 02/08/2022   Procedure: INSERTION PORT-A-CATH- RIJ;  Surgeon: Rusty Aus, DO;  Location: [facility];  Service: General;  Laterality: Right;   REPLACEMENT TOTAL KNEE Left    SHOULDER ARTHROSCOPY Bilateral    TOTAL HIP ARTHROPLASTY Right 11/09/2020   Procedure: TOTAL HIP ARTHROPLASTY ANTERIOR APPROACH;  Surgeon: Renette Butters, MD;  Location: [facility];  Service: Orthopedics;  Laterality: Right;   WRIST SURGERY Left     SOCIAL HISTORY:  Social History   Socioeconomic History   Marital status: Married    Spouse name: Not on file   Number of children: 7   Years of education: Not on file   Highest education level: Not on file  Occupational History   Occupation: employed  Tobacco Use   Smoking status: Former    Packs/day: 1.50    Years: 20.00    Pack years: 30.00    Types: Cigarettes    Quit date: 11/19/2005    Years since quitting: 16.2   Smokeless tobacco: Never  Vaping Use   Vaping Use: Never used  Substance and Sexual Activity   Alcohol use: Not Currently   Drug use: Never   Sexual activity: Yes  Other Topics Concern   Not on file  Social History Narrative   **  Merged History Encounter **       Separated from wife 11/2021   Social Determinants of Health   Financial Resource Strain: Not on file  Food Insecurity: Not on file  Transportation Needs: Not on file  Physical Activity: Not on file  Stress: Not on file  Social Connections: Not on file  Intimate Partner Violence: Not on file    FAMILY HISTORY:  Family History  Problem Relation Age of Onset   Heart disease Mother    Dementia Father    Heart disease Sister    Cancer Brother    Hypertension Brother    Hypertension Brother    Healthy Son    Healthy Son    Healthy Son     Healthy Son    Healthy Daughter    Healthy Daughter    Healthy Daughter     CURRENT MEDICATIONS:  Current Outpatient Medications  Medication Sig Dispense Refill   naproxen sodium (ALEVE) 220 MG tablet Take 220-440 mg by mouth daily as needed (pain).     oxyCODONE (ROXICODONE) 5 MG immediate release tablet Take 1 tablet (5 mg total) by mouth every 8 (eight) hours as needed. 10 tablet 0   No current facility-administered medications for this visit.    ALLERGIES:  No Known Allergies  PHYSICAL EXAM:  Performance status (ECOG): 0 - Asymptomatic  Vitals:   02/15/22 1314  BP: 108/69  Pulse: 77  Resp: 18  Temp: 97.8 F (36.6 C)  SpO2: 92%   Wt Readings from Last 3 Encounters:  02/15/22 173 lb 1 oz (78.5 kg)  02/08/22 180 lb (81.6 kg)  02/02/22 180 lb (81.6 kg)   Physical Exam Vitals reviewed.  Constitutional:      Appearance: Normal appearance.  Cardiovascular:     Rate and Rhythm: Normal rate and regular rhythm.     Pulses: Normal pulses.     Heart sounds: Normal heart sounds.  Pulmonary:     Effort: Pulmonary effort is normal.     Breath sounds: Normal breath sounds.  Neurological:     General: No focal deficit present.     Mental Status: He is alert and oriented to person, place, and time.  Psychiatric:        Mood and Affect: Mood normal.        Behavior: Behavior normal.     LABORATORY DATA:  I have reviewed the labs as listed.  CBC Latest Ref Rng & Units 01/17/2022 12/26/2021 10/27/2020  WBC 4.0 - 10.5 K/uL 6.3 6.6 5.5  Hemoglobin 13.0 - 17.0 g/dL 11.7(L) 11.5(L) 12.4(L)  Hematocrit 39.0 - 52.0 % 35.6(L) 34.5(L) 36.6(L)  Platelets 150 - 400 K/uL 253 210 141(L)   CMP Latest Ref Rng & Units 12/26/2021 12/16/2021 10/27/2020  Glucose 70 - 99 mg/dL 94 - 90  BUN 8 - 23 mg/dL 19 - 12  Creatinine 0.61 - 1.24 mg/dL 0.89 1.00 0.98  Sodium 135 - 145 mmol/L 134(L) - 140  Potassium 3.5 - 5.1 mmol/L 4.2 - 3.7  Chloride 98 - 111 mmol/L 101 - 105  CO2 22 - 32 mmol/L 26  - 28  Calcium 8.9 - 10.3 mg/dL 9.1 - 9.2  Total Protein 6.5 - 8.1 g/dL 7.7 - -  Total Bilirubin 0.3 - 1.2 mg/dL 0.5 - -  Alkaline Phos 38 - 126 U/L 48 - -  AST 15 - 41 U/L 18 - -  ALT 0 - 44 U/L 9 - -    DIAGNOSTIC IMAGING:  I  have independently reviewed the scans and discussed with the patient. CT GUIDED NEEDLE PLACEMENT  Result Date: 01/17/2022 INDICATION: LEFT lung mass. EXAM: CT GUIDANCE NEEDLE PLACEMENT Procedure: CT-GUIDED LEFT HILAR LUNG MASS BIOPSY RADIATION DOSE REDUCTION: This exam was performed according to the departmental dose-optimization program which includes automated exposure control, adjustment of the mA and/or kV according to patient size and/or use of iterative reconstruction technique. COMPARISON:  CT chest, 12/16/2021. MEDICATIONS: None. ANESTHESIA/SEDATION: Fentanyl 100 mcg IV; Versed 2 mg IV Sedation time: 12 minutes; The patient was continuously monitored during the procedure by the interventional radiology nurse under my direct supervision. CONTRAST:  None. COMPLICATIONS: None immediate. PROCEDURE: Informed consent was obtained from the patient following an explanation of the procedure, risks, benefits and alternatives. A time out was performed prior to the initiation of the procedure. The patient was positioned supine on the CT table and a limited CT was performed for procedural planning demonstrating LEFT hilar lung mass. The procedure was planned. The operative site was prepped and draped in the usual sterile fashion. Appropriate trajectory was confirmed with a 22 gauge spinal needle after the adjacent tissues were anesthetized with 1% Lidocaine with epinephrine. Under intermittent CT guidance, a 17 gauge coaxial needle was advanced into the peripheral aspect of the mass. Appropriate positioning was confirmed and 3 samples were obtained with an 18 gauge core needle biopsy device. The co-axial needle was removed and hemostasis was achieved with hemostatic patch. A limited  postprocedural CT was negative for pneumothorax, pulmonary hemorrhage or additional complication. A dressing was placed. The patient tolerated the procedure well without immediate postprocedural complication. IMPRESSION: Successful CT guided core needle biopsy of LEFT hilar lung mass, as above. Michaelle Birks, MD [facility] Electronically Signed   By: Michaelle Birks M.D.   On: 01/17/2022 13:26   DG Chest Port 1 View  Result Date: 02/08/2022 CLINICAL DATA:  Provided history: Port-A-Cath in place. EXAM: PORTABLE CHEST 1 VIEW COMPARISON:  Prior chest radiographs 01/17/2022 and earlier. Chest CT 12/16/2021. FINDINGS: Right chest infusion port catheter with tip projecting at the level of the upper SVC. Heart size within normal limits. Aortic atherosclerosis. Numerous bilateral pulmonary nodules and masses, many of which were better appreciated on the prior chest CT of 12/16/2021. As before, there is a dominant large mass in the left hilar region. Mediastinal and hilar lymphadenopathy was also better appreciated on this prior exam. A portion of the left lateral costophrenic angle is excluded from the field of view. Within this limitation, there is no evidence of pleural effusion. No evidence of pneumothorax. No acute bony abnormality identified. Advanced degenerative changes of the left glenohumeral joint. Impression #2 will be called to the ordering clinician or representative by the Radiologist Assistant, and communication documented in the PACS or [employer]. IMPRESSION: A portion of the left lateral costophrenic angle is excluded from the field of view. Right chest infusion port catheter with tip projecting at the level of the upper superior vena cava. Advancement recommended if catheter termination at the superior cavoatrial junction is desired. No evidence of pneumothorax. Numerous bilateral pulmonary nodules and masses, many of which were better  appreciated on the prior chest CT of 12/16/2021. As before, large dominant mass is present in the left hilar region. Mediastinal and hilar lymphadenopathy was also better appreciated on this prior exam. Aortic Atherosclerosis (ICD10-I70.0). Electronically Signed   By: Kellie Simmering D.O.   On: 02/08/2022 11:43   DG Chest [facility]  Result Date: 01/17/2022 CLINICAL DATA:  Status post right lung biopsy. EXAM: PORTABLE CHEST 1 VIEW COMPARISON:  December 15, 2021. FINDINGS: Normal cardiac size. Large left perihilar mass is again noted. Multiple rounded densities are noted throughout both lungs concerning for metastatic disease. No definite pneumothorax is noted status post biopsy. Bony thorax unremarkable. IMPRESSION: No definite pneumothorax seen status post left lung biopsy. Electronically Signed   By: Marijo Conception M.D.   On: 01/17/2022 14:10   DG C-Arm 1-60 Min-No Report  Result Date: 02/08/2022 Fluoroscopy was utilized by the requesting physician.  No radiographic interpretation.     ASSESSMENT:  Left lung mass with multiple lung nodules: - Presentation with dry cough for 6 months. - 30 pound weight loss in the last couple of years, weight stable over the last 6 months. - CT chest with contrast on 12/16/2021 showed bulky left hilar mass measuring 8.1 x 7.5 cm.  Numerous bilateral lung nodules of varying sizes.  Left adrenal nodule measuring 2.8 x 2.2 cm.  Mediastinal and bilateral hilar adenopathy with the largest pretracheal node measuring 4.1 x 3.1 cm. - MRI of the brain from 01/04/2022 which was negative for metastatic disease. - CTAP from 01/03/2022 which showed isolated left adrenal metastasis with no other evidence of metastatic disease in the abdomen or pelvis. - Pathology of left lung biopsy which shows adenocarcinoma with necrosis.  CK20 positive and CDX2 positive but negative for CK7 indicating colonic primary. 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  HER2 negative.  TMB low.  MSI-stable.   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  Other targetable mutations negative.   Social/family history: - Lives by himself.  He paints houses for living. - He quit smoking 12 years back and started back again 1 and half year ago and smoked half pack per day.  He quit again about 1 week ago. - Father had cancer, type unknown to the patient.  Brother died of brain tumor.  3.  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  Margins negative.  No lymphovascular or perineural invasion. - Received 3 cycles of XELOX followed by Xeloda for total of 6 months.  Oxaliplatin discontinued during cycle 4 secondary to transaminitis, elevated bilirubin and thrombocytopenia.  However he was also treated for hep C with Harvoni after that.   PLAN:  Metastatic colon cancer to the lungs and left adrenal gland: -He has port placed. - He reports occasional hemoptysis and cough. - I have reviewed lung scan images with him.  Most likely the left lower lobe lung lesion is causing cough and hemoptysis. - We reviewed NGS test results which showed K-ras mutation, precluding him from receiving EGFR antibody. - We have discussed first-line treatment with FOLFIRI and bevacizumab.  However I would hold off on bevacizumab because of hemoptysis.  Once hemoptysis resolved, we will introduce bevacizumab. - We talked about schedule of FOLFIRI and side effects in detail. - We will likely start his treatment next week.  He would like to continue working during treatments. - RTC 2 weeks for follow-up after first cycle.  2.  Lower rib/epigastric pain: - He has some pain in the xiphisternal region.  This has been stable.    Orders placed this encounter:  No orders of the defined types were placed in this encounter.    Derek Jack, MD [place] [phone]   I, Thana Ates, am acting as a Education administrator  for Dr. Derek Jack.  I,  Derek Jack MD, have reviewed the above documentation for accuracy and completeness, and I agree with the above.

## 2022-02-15 NOTE — Patient Instructions (Addendum)
[place] at [facility] ?Discharge Instructions ? ? ?You were seen and examined today by Dr. Delton Coombes. ? 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  He suggests starting you on a regimen called FOLFIRI.  This treatment is every 2 weeks. You will also wear an ambulatory pump for 2 days once you leave the clinic on the day of treatment.  You will return after 48 hours to the clinic to have the pump removed.  The intent of this treatment is to control this cancer and prevent it from spreading further.  Treatment should also help alleviate any of the symptoms this cancer is causing. We will continue on this treatment until it stops working to control this disease. This is not a curable cancer, but it is treatable.  ? ?Return as scheduled in 1 week to initiate treatment.  ? ? ?Thank you for choosing [place] at [facility] to provide your oncology and hematology care.  To afford each patient quality time with our provider, please arrive at least 15 minutes before your scheduled appointment time.  ? ?If you have a lab appointment with the [facility] please come in thru the Main Entrance and check in at the main information desk. ? ?You need to re-schedule your appointment should you arrive 10 or more minutes late.  We strive to give you quality time with our providers, and arriving late affects you and other patients whose appointments are after yours.  Also, if you no show three or more times for appointments you may be dismissed from the clinic at the providers discretion.     ?Again, thank you for choosing [facility]  Our hope is that these requests will decrease the amount of time that you wait before being seen by our physicians.       ?_____________________________________________________________ ? ?Should you have questions after your visit to [facility], please contact our office at [phone] and follow the  prompts.  Our office hours are 8:00 a.m. and 4:30 p.m. Monday - Friday.  Please note that voicemails left after 4:00 p.m. may not be returned until the following business day.  We are closed weekends and major holidays.  You do have access to a nurse 24-7, just call the main number to the clinic [phone] and do not press any options, hold on the line and a nurse will answer the phone.   ? ?For prescription refill requests, have your pharmacy contact our office and allow 72 hours.   ? ?Due to Covid, you will need to wear a mask upon entering the hospital. If you do not have a mask, a mask will be given to you at the Main Entrance upon arrival. For doctor visits, patients may have 1 support person age 19 or older with them. For treatment visits, patients can not have anyone with them due to social distancing guidelines and our immunocompromised population.  ? ?   ?

## 2022-02-15 NOTE — Progress Notes (Signed)
START ON PATHWAY REGIMEN - Colorectal ? ? ?  A cycle is every 14 days: ?    Bevacizumab-xxxx  ?    Irinotecan  ?    Leucovorin  ?    Fluorouracil  ?    Fluorouracil  ? ?**Always confirm dose/schedule in your pharmacy ordering system** ? 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

## 2022-02-16 ENCOUNTER — Encounter (HOSPITAL_COMMUNITY): Payer: Self-pay

## 2022-02-16 NOTE — Progress Notes (Signed)
Written information on Folfiri and Avastin provided to patient as discussed by Dr. Delton Coombes ?

## 2022-02-16 NOTE — Patient Instructions (Addendum)
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  You will be treated in the clinic every 2 weeks with a combination of chemotherapy drugs.  Those drugs are irinotecan, leucovorin (not chemo), and fluorouracil (5FU). This cancer is not curable but treatable.  The goal of treatment is to shrink this cancer, prevent it from spreading any further, and to alleviate any symptoms you may be having related to this disease.  We will continue with this treatment as long as it is helping to control your cancer.  You will see the doctor regularly throughout treatment.  We will obtain blood work from you prior to every treatment and monitor your results to make sure it is safe to give your treatment. The doctor monitors your response to treatment by the way you are feeling, your blood work, and by obtaining scans periodically.  There will be wait times while you are here for treatment.  It will take about 30 minutes to 1 hour for your lab work to result.  Then there will be wait times while pharmacy mixes your medications.    Medications you will receive in the clinic prior to your chemotherapy medications:  Aloxi:  ALOXI is used in adults to help prevent nausea and vomiting that happens with certain chemotherapy drugs.  Aloxi is a long acting medication, and will remain in your system for about two days.   Dexamethasone:  This is a steroid given prior to chemotherapy to help prevent allergic reactions; it may also help prevent and control nausea and diarrhea.     Irinotecan (Camptosar)    About This Drug  Irinotecan is used to treat cancer. It is given in the vein (IV). It will take 1.5 hours to infuse.    Possible Side Effects   Bone marrow suppression. This is a decrease in the number of white blood cells, red blood cells, and platelets. This may raise your risk of infection, make you tired and weak, and raise your risk of bleeding.     Soreness of the mouth and throat. You may have red areas, white patches, or sores that hurt.    Nausea and vomiting (throwing up)    Pain in your abdomen  Diarrhea (loose bowel movements)    Constipation (unable to move bowels)    Decreased appetite (decreased hunger)    Weight loss    Changes in your liver function    Pain    Weakness    Fever    Infection    Hair loss. Hair loss is often temporary, although with certain medicine, hair loss can sometimes be permanent. Hair loss may happen suddenly or gradually. If you lose hair, you may lose it from your head, face, armpits, pubic area, chest, and/or legs. You may also notice your hair getting thin.   Note: Each of the side effects above was reported in 30% or greater of patients treated with irinotecan alone or in combination with other chemotherapy. Not all possible side effects are included above.   Warnings and Precautions    Severe diarrhea and colitis which is swelling (inflammation) in the colon - symptoms are diarrhea, stomach cramping, and sometimes blood in the bowel movements. Very rarely, an abnormal hole in your stomach, small and/or large intestine can happen. Diarrhea can begin shortly after the infusion or up to a week or two after, and can be life-threatening if it leads to dehydration, and other complications.  Severe bone marrow suppression which can be life-threatening    Allergic reactions, including anaphylaxis are rare but may happen in some patients. Signs of allergic reaction to this drug may be swelling of the face, feeling like your tongue or throat are swelling, trouble breathing, rash, itching, fever, chills, feeling dizzy, and/or feeling that your heart is beating in a fast or not normal way. If this happens, do not take another dose of this drug. You should get urgent medical treatment.    Changes in your kidney function which can cause kidney failure and be life-threatening    Inflammation  (swelling) or scarring of the lungs, which can be life-threatening. You may have a cough or trouble breathing. Note: Some of the side effects above are very rare. If you have concerns and/or questions, please discuss them with your medical team. Important Information    This drug may be present in the saliva, tears, sweat, urine, stool, vomit, semen, and vaginal secretions. Talk to your doctor and/or your nurse about the necessary precautions to take during this time.    It is important that you notify your doctor and/or nurse at the first sign of diarrhea, so they can provide you with anti-diarrheal medication and give you further instructions. Notify your doctor and/ or nurse if you are taking anti-diarrheal medication and your symptoms have not improved or are worsening.    Treating Side Effects    Manage tiredness by pacing your activities for the day.    Be sure to include periods of rest between energy-draining activities.    To decrease the risk of infection, wash your hands regularly.  Avoid close contact with people who have a cold, the flu, or other infections.    Take your temperature as your doctor or nurse tells you, and whenever you feel like you may have a fever.    To help decrease the risk of bleeding, use a soft toothbrush. Check with your nurse before using dental floss.    Be very careful when using knives or tools.    Use an electric shaver instead of a razor.    Drink plenty of fluids (a minimum of eight glasses per day is recommended).    If you throw up or have diarrhea, you should drink more fluids so that you do not become dehydrated (lack of water in the body from losing too much fluid).    Mouth care is very important. Your mouth care should consist of routine, gentle cleaning of your teeth or dentures and rinsing your mouth with a mixture of 1/2 teaspoon of salt in 8 ounces of water or 1/2 teaspoon of baking soda in 8 ounces of water. This should be done at  least after each meal and at bedtime.    If you have mouth sores, avoid mouthwash that has alcohol. Also avoid alcohol and smoking because they can bother your mouth and throat.     To help with nausea and vomiting, eat small, frequent meals instead of three large meals a day. Choose foods and drinks that are at room temperature. Ask your nurse or doctor about other helpful tips and medicine that is available to help stop or lessen these symptoms.    If you have diarrhea, eat low-fiber foods that are high in protein and calories and avoid foods that can irritate your digestive tracts or lead to cramping.    If you are not able to move your bowels, check with your doctor or nurse before  you use enemas, laxatives, or suppositories.    Ask your doctor or nurse about medicines that are available to help stop or lessen constipation and/ or diarrhea.    To help with decreased appetite, eat small, frequent meals. Eat foods high in calories and protein, such as meat, poultry, fish, dry beans, tofu, eggs, nuts, milk, yogurt, cheese, ice cream, pudding, and nutritional supplements.    Consider using sauces and spices to increase taste. Daily exercise, with your doctors approval, may increase your appetite.    To help with weight loss, drink fluids that contribute calories (whole milk, juice, soft drinks, sweetened beverages, milkshakes, and nutritional supplements) instead of water.    Keeping your pain under control is important to your well-being. Please tell your doctor or nurse if you are experiencing pain.    To help with hair loss, wash with a mild shampoo and avoid washing your hair every day. Avoid coloring your hair.    Avoid rubbing your scalp, pat your hair or scalp dry.    Limit your use of hair spray, electric curlers, blow dryers, and curling irons.    If you are interested in getting a wig, talk to your nurse and they can help you get in touch with programs in your local area.     Food and Drug Interactions    This drug may interact with grapefruit and grapefruit juice. Talk to your doctor as this could make side effects worse.    Check with your doctor or pharmacist about all other prescription medicines and over-the-counter medicines and dietary supplements (vitamins, minerals, herbs, and others) you are taking before starting this medicine as there are known drug interactions with irinotecan. Also, check with your doctor or pharmacist before starting any new prescription or over-the-counter medicines, or dietary supplements to make sure that there are no interactions.    Avoid the use of [place] while taking irinotecan as this may lower the levels of the drug in your body, which can make it less effective.    When to Call the Doctor   Call your doctor or nurse if you have any of these symptoms and/or any new or unusual symptoms:    Fever of 100.4 F (38 C) or higher    Chills    Pain in your chest     Dry cough    Trouble breathing and/or wheezing    Feeling dizzy or lightheaded    Easy bleeding or bruising    Tiredness or weakness that interferes with your daily activities    Pain in your mouth or throat that makes it hard to eat or drink    Nausea that stops you from eating or drinking and/or is not relieved by prescribed medicines    Throwing up   Diarrhea, 4 times in one day or diarrhea with lack of strength or a feeling of being dizzy    No bowel movement in 3 days or when you feel uncomfortable    Severe abdominal pain that does not go away    Difficulty swallowing    General pain that does not go away, or is not relieved by prescribed medicines    Blood in your stool    Lasting loss of appetite or rapid weight loss of five pounds in a week    Decreased or very dark urine    Signs of allergic reaction: swelling of the face, feeling like your tongue or throat are swelling, trouble breathing, rash, itching, fever,  chills, feeling dizzy, and/or feeling that your heart is beating in a fast or not normal way. If this happens, call 911 for emergency care.    Signs of possible liver problems: dark urine, pale bowel movements, pain in your abdomen, feeling very tired and weak, unusual itching, or yellowing of the eyes or skin    If you think you may be pregnant or may have impregnated your partner    Reproduction Warnings    Pregnancy warning: This drug can have harmful effects on the unborn baby. Women of childbearing potential should use effective methods of birth control during your cancer treatment and for 6 months after treatment. Men with male partners of childbearing potential should use effective methods of birth control during your cancer treatment and for 3 months after your cancer treatment. Let your doctor know right away if you think you may be pregnant or may have impregnated your partner.    Breastfeeding warning: Women should not breastfeed during treatment and for at least 7 days after treatment because this drug can enter the breast milk and cause harm to a breastfeeding baby.  Fertility warning: In men and women both, this drug may affect your ability to have children in the future. Talk with your doctor or nurse if you plan to have children. Ask for information on sperm or egg banking. Revised August 2021   Leucovorin Calcium  About This Drug  Leucovorin is a vitamin. It is used in combination with other cancer fighting drugs such as 5-fluorouracil and methotrexate. Leucovorin is given in the vein (IV).  This drug runs at the same time as the oxaliplatin and takes 2 hours to infuse.   Possible Side Effects  Rash and itching  Note: Leucovorin by itself has very few side effects. Other side effects you may have can be caused by the other drugs you are taking, such as 5-fluorouracil.    Warnings and Precautions   Allergic reactions, including anaphylaxis are rare but may happen in some  patients. Signs of allergic reaction to this drug may be swelling of the face, feeling like your tongue or throat are swelling, trouble breathing, rash, itching, fever, chills, feeling dizzy, and/or feeling that your heart is beating in a fast or not normal way. If this happens, do not take another dose of this drug. You should get urgent medical treatment.   Food and Drug Interactions   There are no known interactions of leucovorin with food.   This drug may interact with other medicines. Tell your doctor and pharmacist about all the prescription and over-the-counter medicines and dietary supplements (vitamins, minerals, herbs and others) that you are taking at this time.   Also, check with your doctor or pharmacist before starting any new prescription or over-the-counter medicines, or dietary supplements to make sure that there are no interactions.   When to Call the Doctor  Call your doctor or nurse if you have any of these symptoms and/or any new or unusual symptoms:   A new rash or a rash that is not relieved by prescribed medicines   Signs of allergic reaction: swelling of the face, feeling like your tongue or throat are swelling, trouble breathing, rash, itching, fever, chills, feeling dizzy, and/or feeling that your heart is beating in a fast or not normal way. If this happens, call 911 for emergency care.   If you think you may be pregnant   Reproduction Warnings   Pregnancy warning: It is not known if this drug  may harm an unborn child. For this reason, be sure to talk with your doctor if you are pregnant or planning to become pregnant while receiving this drug. Let your doctor know right away if you think you may be pregnant   Breastfeeding warning: It is not known if this drug passes into breast milk. For this reason, women should talk to their doctor about the risks and benefits of breastfeeding during treatment with this drug because this drug may enter the breast milk and  cause harm to a breastfeeding baby.   Fertility warning: Human fertility studies have not been done with this drug. Talk with your doctor or nurse if you plan to have children. Ask for information on sperm or egg banking.   5-Fluorouracil (Adrucil; 5FU)  About This Drug  Fluorouracil is used to treat cancer. It is given in the vein (IV). It is given as an IV push from a syringe and also as a continuous infusion given via an ambulatory pump (a pump you take home and wear for a specified amount of time).  Possible Side Effects   Bone marrow suppression. This is a decrease in the number of white blood cells, red blood cells, and platelets. This may raise your risk of infection, make you tired and weak (fatigue), and raise your risk of bleeding   Changes in the tissue of the heart and/or heart attack. Some changes may happen that can cause your heart to have less ability to pump blood.   Blurred vision or other changes in eyesight   Nausea and throwing up (vomiting)   Diarrhea (loose bowel movements)   Ulcers - sores that may cause pain or bleeding in your digestive tract, which includes your mouth, esophagus, stomach, small/large intestines and rectum   Soreness of the mouth and throat. You may have red areas, white patches, or sores that hurt.   Allergic reactions, including anaphylaxis are rare but may happen in some patients. Signs of allergic reaction to this drug may be swelling of the face, feeling like your tongue or throat are swelling, trouble breathing, rash, itching, fever, chills, feeling dizzy, and/or feeling that your heart is beating in a fast or not normal way. If this happens, do not take another dose of this drug. You should get urgent medical treatment.   Sensitivity to light (photosensitivity). Photosensitivity means that you may become more sensitive to the sun and/or light. You may get a skin rash/reaction if you are in the sun or are exposed to sun lamps and tanning  beds. Your eyes may water more, mostly in bright light.   Changes in your nail color, nail loss and/or brittle nail   Darkening of the skin, or changes to the color of your skin and/or veins used for infusion   Rash, dry skin, or itching  Note: Not all possible side effects are included above.  Warnings and Precautions   Hand-and-foot syndrome. The palms of your hands or soles of your feet may tingle, become numb, painful, swollen, or red.   Changes in your central nervous system can happen. The central nervous system is made up of your brain and spinal cord. You could feel extreme tiredness, agitation, confusion, hallucinations (see or hear things that are not there), trouble understanding or speaking, loss of control of your bowels or bladder, eyesight changes, numbness or lack of strength to your arms, legs, face, or body, or coma. If you start to have any of these symptoms let your doctor know  right away.   Side effects of this drug may be unexpectedly severe in some patients  Note: Some of the side effects above are very rare. If you have concerns and/or questions, please discuss them with your medical team.   Important Information   This drug may be present in the saliva, tears, sweat, urine, stool, vomit, semen, and vaginal secretions. Talk to your doctor and/or your nurse about the necessary precautions to take during this time.   Treating Side Effects   Manage tiredness by pacing your activities for the day.   Be sure to include periods of rest between energy-draining activities.   To help decrease the risk of infections, wash your hands regularly.   Avoid close contact with people who have a cold, the flu, or other infections.   Take your temperature as your doctor or nurse tells you, and whenever you feel like you may have a fever.   Use a soft toothbrush. Check with your nurse before using dental floss.   Be very careful when using knives or tools.   Use an  electric shaver instead of a razor.   If you have a nose bleed, sit with your head tipped slightly forward. Apply pressure by lightly pinching the bridge of your nose between your thumb and forefinger. Call your doctor if you feel dizzy or faint or if the bleeding doesnt stop after 10 to 15 minutes.   Drink plenty of fluids (a minimum of eight glasses per day is recommended).   If you throw up or have loose bowel movements, you should drink more fluids so that you do not  become dehydrated (lack of water in the body from losing too much fluid).   To help with nausea and vomiting, eat small, frequent meals instead of three large meals a day. Choose foods and drinks that are at room temperature. Ask your nurse or doctor about other helpful tips and medicine that is available to help, stop, or lessen these symptoms.   If you have diarrhea, eat low-fiber foods that are high in protein and calories and avoid foods that can irritate your digestive tracts or lead to cramping.   Ask your nurse or doctor about medicine that can lessen or stop your diarrhea.   Mouth care is very important. Your mouth care should consist of routine, gentle cleaning of your teeth or dentures and rinsing your mouth with a mixture of 1/2 teaspoon of salt in 8 ounces of water or 1/2 teaspoon of baking soda in 8 ounces of water. This should be done at least after each meal and at bedtime.   If you have mouth sores, avoid mouthwash that has alcohol. Also avoid alcohol and smoking because they can bother your mouth and throat.   Keeping your nails moisturized may help with brittleness.   To help with itching, moisturize your skin several times day.   Use sunscreen with SPF 30 or higher when you are outdoors even for a short time. Cover up when you are out in the sun. Wear wide-brimmed hats, long-sleeved shirts, and pants. Keep your neck, chest, and back covered. Wear dark sun glasses when in the sun or bright lights.   If  you get a rash do not put anything on it unless your doctor or nurse says you may. Keep the area around the rash clean and dry. Ask your doctor for medicine if your rash bothers you.   Keeping your pain under control is important to your well-being.  Please tell your doctor or nurse if you are experiencing pain.   Food and Drug Interactions   There are no known interactions of fluorouracil with food.   Check with your doctor or pharmacist about all other prescription medicines and over-the-counter medicines and dietary supplements (vitamins, minerals, herbs and others) you are taking before starting this medicine as there are known drug interactions with 5-fluoroucacil. Also, check with your doctor or pharmacist before starting any new prescription or over-the-counter medicines, or dietary supplements to make sure that there are no interactions.   When to Call the Doctor  Call your doctor or nurse if you have any of these symptoms and/or any new or unusual symptoms:   Fever of 100.4 F (38 C) or higher   Chills   Easy bleeding or bruising   Nose bleed that doesnt stop bleeding after 10-15 minutes   Trouble breathing   Feeling dizzy or lightheaded   Feeling that your heart is beating in a fast or not normal way (palpitations)   Chest pain or symptoms of a heart attack. Most heart attacks involve pain in the center of the chest that lasts more than a few minutes. The pain may go away and come back or it can be constant. It can feel like pressure, squeezing, fullness, or pain. Sometimes pain is felt in one or both arms, the back, neck, jaw, or stomach. If any of these symptoms last 2 minutes, call 911.   Confusion and/or agitation   Hallucinations   Trouble understanding or speaking   Loss of control of bowels or bladder   Blurry vision or changes in your eyesight   Headache that does not go away   Numbness or lack of strength to your arms, legs, face, or body   Nausea  that stops you from eating or drinking and/or is not relieved by prescribed medicines   Throwing up    Diarrhea, 4 times in one day or diarrhea with lack of strength or a feeling of being dizzy   Pain in your mouth or throat that makes it hard to eat or drink   Pain along the digestive tract - especially if worse after eating   Blood in your vomit (bright red or coffee-ground) and/or stools (bright red, or black/tarry)   Coughing up blood   Tiredness that interferes with your daily activities   Painful, red, or swollen areas on your hands or feet or around your nails   A new rash or a rash that is not relieved by prescribed medicines   Develop sensitivity to sunlight/light   Numbness and/or tingling of your hands and/or feet   Signs of allergic reaction: swelling of the face, feeling like your tongue or throat are swelling, trouble breathing, rash, itching, fever, chills, feeling dizzy, and/or feeling that your heart is beating in a fast or not normal way. If this happens, call 911 for emergency care.   If you think you are pregnant or may have impregnated your partner  Reproduction Warnings   Pregnancy warning: This drug may have harmful effects on the unborn baby. Women of child bearing potential should use effective methods of birth control during your cancer treatment and 3 months after treatment. Men with male partners of childbearing potential should use effective methods of birth control during your cancer treatment and for 3 months after your cancer treatment. Let your doctor know right away if you think you may be pregnant or may have impregnated your partner.  Breastfeeding warning: It is not known if this drug passes into breast milk. For this reason, Women should not breastfeed during treatment because this drug could enter the breast milk and cause harm to a breastfeeding baby.   Fertility warning: In men and women both, this drug may affect your ability to have  children in the future. Talk with your doctor or nurse if you plan to have children. Ask for information on sperm or egg banking.   SELF CARE ACTIVITIES WHILE RECEIVING CHEMOTHERAPY:  Hydration Increase your fluid intake and try to drink at least 8 to 12 cups (64 ounces) of water/decaffeinated beverages per day while receiving treatment. You can still have your cup of coffee or soda but these beverages do not count as part of your 8 to 12 cups that you need to drink daily. No alcohol intake.  Medications Continue taking your normal prescription medication as prescribed.  If you start any new herbal or new supplements please let us know first to make sure it is safe.  Mouth Care Have teeth cleaned professionally before starting treatment. Keep dentures and partial plates clean. Use soft toothbrush and do not use mouthwashes that contain alcohol. Biotene is a good mouthwash that is available at most pharmacies or may be ordered by calling [phone]. Use warm salt water gargles (1 teaspoon salt per 1 quart warm water) before and after meals and at bedtime. If you need dental work, please let the doctor know before you go for your appointment so that we can coordinate the best possible time for you in regards to your chemo regimen. You need to also let your dentist know that you are actively taking chemo. We may need to do labs prior to your dental appointment.  Skin Care Always use sunscreen that has not expired and with SPF (Sun Protection Factor) of 50 or higher. Wear hats to protect your head from the sun. Remember to use sunscreen on your hands, ears, face, & feet.  Use good moisturizing lotions such as udder cream, eucerin, or even Vaseline. Some chemotherapies can cause dry skin, color changes in your skin and nails.    Avoid long, hot showers or baths. Use gentle, fragrance-free soaps and laundry detergent. Use moisturizers, preferably creams or ointments rather than lotions because the  thicker consistency is better at preventing skin dehydration. Apply the cream or ointment within 15 minutes of showering. Reapply moisturizer at night, and moisturize your hands every time after you wash them.  Hair Loss (if your doctor says your hair will fall out)  If your doctor says that your hair is likely to fall out, decide before you begin chemo whether you want to wear a wig. You may want to shop before treatment to match your hair color. Hats, turbans, and scarves can also camouflage hair loss, although some people prefer to leave their heads uncovered. If you go bare-headed outdoors, be sure to use sunscreen on your scalp. Cut your hair short. It eases the inconvenience of shedding lots of hair, but it also can reduce the emotional impact of watching your hair fall out. Don't perm or color your hair during chemotherapy. Those chemical treatments are already damaging to hair and can enhance hair loss. Once your chemo treatments are done and your hair has grown back, it's OK to resume dyeing or perming hair.  With chemotherapy, hair loss is almost always temporary. But when it grows back, it may be a different color or texture. In older adults  who still had hair color before chemotherapy, the new growth may be completely gray.  Often, new hair is very fine and soft.  Infection Prevention Please wash your hands for at least 30 seconds using warm soapy water. Handwashing is the #1 way to prevent the spread of germs. Stay away from sick people or people who are getting over a cold. If you develop respiratory systems such as green/yellow mucus production or productive cough or persistent cough let us know and we will see if you need an antibiotic. It is a good idea to keep a pair of gloves on when going into grocery stores/[employer] to decrease your risk of coming into contact with germs on the carts, etc. Carry alcohol hand gel with you at all times and use it frequently if out in public. If your  temperature reaches 100.5 or higher please call the clinic and let us know.  If it is after hours or on the weekend please go to the ER if your temperature is over 100.5.  Please have your own personal thermometer at home to use.    Sex and bodily fluids If you are going to have sex, a condom must be used to protect the person that isn't taking chemotherapy. Chemo can decrease your libido (sex drive). For a few days after chemotherapy, chemotherapy can be excreted through your bodily fluids.  When using the toilet please close the lid and flush the toilet twice.  Do this for a few day after you have had chemotherapy.   Effects of chemotherapy on your sex life Some changes are simple and won't last long. They won't affect your sex life permanently.  Sometimes you may feel: too tired not strong enough to be very active sick or sore  not in the mood anxious or low Your anxiety might not seem related to sex. For example, you may be worried about the cancer and how your treatment is going. Or you may be worried about money, or about how you family are coping with your illness.  These things can cause stress, which can affect your interest in sex. It's important to talk to your partner about how you feel.  Remember - the changes to your sex life don't usually last long. There's usually no medical reason to stop having sex during chemo. The drugs won't have any long term physical effects on your performance or enjoyment of sex. Cancer can't be passed on to your partner during sex  Contraception It's important to use reliable contraception during treatment. Avoid getting pregnant while you or your partner are having chemotherapy. This is because the drugs may harm the baby. Sometimes chemotherapy drugs can leave a man or woman infertile.  This means you would not be able to have children in the future. You might want to talk to someone about permanent infertility. It can be very difficult to learn that you  may no longer be able to have children. Some people find counselling helpful. There might be ways to preserve your fertility, although this is easier for men than for women. You may want to speak to a fertility expert. You can talk about sperm banking or harvesting your eggs. You can also ask about other fertility options, such as donor eggs. If you have or have had breast cancer, your doctor might advise you not to take the contraceptive pill. This is because the hormones in it might affect the cancer. It is not known for sure whether or not chemotherapy drugs  can be passed on through semen or secretions from the vagina. Because of this some doctors advise people to use a barrier method if you have sex during treatment. This applies to vaginal, anal or oral sex. Generally, doctors advise a barrier method only for the time you are actually having the treatment and for about a week after your treatment. Advice like this can be worrying, but this does not mean that you have to avoid being intimate with your partner. You can still have close contact with your partner and continue to enjoy sex.  Animals If you have cats or birds we just ask that you not change the litter or change the cage.  Please have someone else do this for you while you are on chemotherapy.   Food Safety During and After Cancer Treatment Food safety is important for people both during and after cancer treatment. Cancer and cancer treatments, such as chemotherapy, radiation therapy, and stem cell/bone marrow transplantation, often weaken the immune system. This makes it harder for your body to protect itself from foodborne illness, also called food poisoning. Foodborne illness is caused by eating food that contains harmful bacteria, parasites, or viruses.  Foods to avoid Some foods have a higher risk of becoming tainted with bacteria. These include: Unwashed fresh fruit and vegetables, especially leafy vegetables that can hide dirt and  other contaminants Raw sprouts, such as alfalfa sprouts Raw or undercooked beef, especially ground beef, or other raw or undercooked meat and poultry Fatty, fried, or spicy foods immediately before or after treatment.  These can sit heavy on your stomach and make you feel nauseous. Raw or undercooked shellfish, such as oysters. Sushi and sashimi, which often contain raw fish.  Unpasteurized beverages, such as unpasteurized fruit juices, raw milk, raw yogurt, or cider Undercooked eggs, such as soft boiled, over easy, and poached; raw, unpasteurized eggs; or foods made with raw egg, such as homemade raw cookie dough and homemade mayonnaise  Simple steps for food safety  Shop smart. Do not buy food stored or displayed in an unclean area. Do not buy bruised or damaged fruits or vegetables. Do not buy cans that have cracks, dents, or bulges. Pick up foods that can spoil at the end of your shopping trip and store them in a cooler on the way home.  Prepare and clean up foods carefully. Rinse all fresh fruits and vegetables under running water, and dry them with a clean towel or paper towel. Clean the top of cans before opening them. After preparing food, wash your hands for 20 seconds with hot water and soap. Pay special attention to areas between fingers and under nails. Clean your utensils and dishes with hot water and soap. Disinfect your kitchen and cutting boards using 1 teaspoon of liquid, unscented bleach mixed into 1 quart of water.    Dispose of old food. Eat canned and packaged food before its expiration date (the use by or best before date). Consume refrigerated leftovers within 3 to 4 days. After that time, throw out the food. Even if the food does not smell or look spoiled, it still may be unsafe. Some bacteria, such as Listeria, can grow even on foods stored in the refrigerator if they are kept for too long.  Take precautions when eating out. At restaurants, avoid buffets and  salad bars where food sits out for a long time and comes in contact with many people. Food can become contaminated when someone with a virus, often a norovirus,  or another bug handles it. Put any leftover food in a to-go container yourself, rather than having the server do it. And, refrigerate leftovers as soon as you get home. Choose restaurants that are clean and that are willing to prepare your food as you order it cooked.   AT HOME MEDICATIONS:                                                                                                                                                                Compazine/Prochlorperazine 10mg  tablet. Take 1 tablet every 6 hours as needed for nausea/vomiting. (This can make you sleepy)   EMLA cream. Apply a quarter size amount to port site 1 hour prior to chemo. Do not rub in. Cover with plastic wrap.    Diarrhea Sheet   If you are having loose stools/diarrhea, please purchase Imodium and begin taking as outlined:  At the first sign of poorly formed or loose stools you should begin taking Imodium (loperamide) 2 mg capsules.  Take two tablets (4mg ) followed by one tablet (2mg ) every 2 hours - DO NOT EXCEED 8 tablets in 24 hours.  If it is bedtime and you are having loose stools, take 2 tablets at bedtime, then 2 tablets every 4 hours until morning.   Always call the [facility] if you are having loose stools/diarrhea that you can't get under control.  Loose stools/diarrhea leads to dehydration (loss of water) in your body.  We have other options of trying to get the loose stools/diarrhea to stop but you must let us know!   Constipation Sheet  Colace - 100 mg capsules - take 2 capsules daily.  If this doesn't help then you can increase to 2 capsules twice daily.  Please call if the above does not work for you. Do not go more than 2 days without a bowel movement.  It is very important that you do not become constipated.  It will make you feel sick  to your stomach (nausea) and can cause abdominal pain and vomiting.  Nausea Sheet   Compazine/Prochlorperazine 10mg  tablet. Take 1 tablet every 6 hours as needed for nausea/vomiting (This can make you drowsy).  If you are having persistent nausea (nausea that does not stop) please call the [facility] and let us know the amount of nausea that you are experiencing.  If you begin to vomit, you need to call the [employer] [facility] and if it is the weekend and you have vomited more than one time and can't get it to stop-go to the Emergency Room.  Persistent nausea/vomiting can lead to dehydration (loss of fluid in your body) and will make you feel very weak and unwell. Ice chips, sips of clear liquids, foods that are at room temperature, crackers, and toast  tend to be better tolerated.   SYMPTOMS TO REPORT AS SOON AS POSSIBLE AFTER TREATMENT:  FEVER GREATER THAN 100.4 F  CHILLS WITH OR WITHOUT FEVER  NAUSEA AND VOMITING THAT IS NOT CONTROLLED WITH YOUR NAUSEA MEDICATION  UNUSUAL SHORTNESS OF BREATH  UNUSUAL BRUISING OR BLEEDING  TENDERNESS IN MOUTH AND THROAT WITH OR WITHOUT PRESENCE OF ULCERS  URINARY PROBLEMS  BOWEL PROBLEMS  UNUSUAL RASH      Wear comfortable clothing and clothing appropriate for easy access to any Portacath or PICC line. Let us know if there is anything that we can do to make your therapy better!    What to do if you need assistance after hours or on the weekends: CALL [phone].  HOLD on the line, do not hang up.  You will hear multiple messages but at the end you will be connected with a nurse triage line.  They will contact the doctor if necessary.  Most of the time they will be able to assist you.  Do not call the hospital operator.      I have been informed and understand all of the instructions given to me and have received a copy. I have been instructed to call the clinic [phone] or my family physician as soon as possible for continued medical  care, if indicated. I do not have any more questions at this time but understand that I may call the [facility] or the Patient Navigator at [phone] during office hours should I have questions or need assistance in obtaining follow-up care.

## 2022-02-20 ENCOUNTER — Encounter (HOSPITAL_COMMUNITY): Payer: Self-pay | Admitting: Emergency Medicine

## 2022-02-20 NOTE — Progress Notes (Signed)
Pharmacist Chemotherapy Monitoring - Initial Assessment   ? ?Anticipated start date: 02/22/2022  ? ?The following has been reviewed per standard work regarding the patient's treatment regimen: ?The patient's diagnosis, treatment plan and drug doses, and organ/hematologic function ?Lab orders and baseline tests specific to treatment regimen  ?The treatment plan start date, drug sequencing, and pre-medications ?Prior authorization status  ?Patient's documented medication list, including drug-drug interaction screen and prescriptions for anti-emetics and supportive care specific to the treatment regimen ?The drug concentrations, fluid compatibility, administration routes, and timing of the medications to be used ?The patient's access for treatment and lifetime cumulative dose history, if applicable  ?The patient's medication allergies and previous infusion related reactions, if applicable  ? ?Changes made to treatment plan:  ?treatment plan date ? ?Follow up needed:  ?N/A ? ? ?Wynona Neat, [facility], ?02/20/2022  3:19 PM ? ?

## 2022-02-20 NOTE — Progress Notes (Signed)
PA approved from 02/20/2022-02/21/2023 through [employer] for lidocaine 2.5%. ?

## 2022-02-21 ENCOUNTER — Other Ambulatory Visit: Payer: Self-pay

## 2022-02-21 ENCOUNTER — Encounter: Payer: Self-pay | Admitting: Surgery

## 2022-02-21 ENCOUNTER — Ambulatory Visit (INDEPENDENT_AMBULATORY_CARE_PROVIDER_SITE_OTHER): Payer: 59 | Admitting: Surgery

## 2022-02-21 VITALS — BP 113/73 | HR 66 | Temp 97.8°F | Resp 16 | Ht 71.0 in | Wt 170.0 lb

## 2022-02-21 DIAGNOSIS — Z09 Encounter for follow-up examination after completed treatment for conditions other than malignant neoplasm: Secondary | ICD-10-CM

## 2022-02-21 NOTE — Progress Notes (Signed)
[facility] Note  ? ?HPI:  ?66 y.o. Male presents to clinic for post-op follow-up after right internal jugular Port-A-Cath placement on 3/1. Patient reports that he has been doing well.  He denies minimal pain associated with his catheter, and he is tolerating a diet without nausea and vomiting.  He is moving his bowels without issue.  He denies any issues at his incision site.  He is due to undergo his first infusion tomorrow. ? ?Review of Systems:  ?All other review of systems: otherwise negative  ? ?Vital Signs:  ?BP 113/73   Pulse 66   Temp 97.8 ?F (36.6 ?C) (Other (Comment))   Resp 16   Ht 5\' 11"  (1.803 m)   Wt 170 lb (77.1 kg)   SpO2 94%   BMI 23.71 kg/m?   ? ?Physical Exam:  ?Physical Exam ?Vitals reviewed.  ?Constitutional:   ?   Appearance: Normal appearance.  ?Skin: ?   General: Skin is warm and dry.  ?   Comments: Right chest incision C/D/I with skin glue in place, no erythema, drainage, or evidence of infection; right neck catheter entrance site healed well  ?Neurological:  ?   Mental Status: He is alert.  ? ? ?Laboratory studies: None ? ?Imaging: None ? ?Assessment:  ?66 y.o. yo Male who presents status post right IJ Port-A-Cath insertion on 3/1. ? ?Plan:  ?-Patient has been doing well since the surgery, and denies any significant pain associated with the catheter ?-He is scheduled to undergo his first infusion tomorrow ?-Advised patient to call if he is having any issues with his catheter or if oncology states that he no longer needs it, at which time we can schedule removal of the catheter ?-Follow up PRN ? ?All of the above recommendations were discussed with the patient, and all of patient's  questions were answered to his expressed satisfaction. ? ?Graciella Freer, DO ?[facility] ?[address], [place] [address] ?[phone] (office) ? ? ?

## 2022-02-22 ENCOUNTER — Inpatient Hospital Stay (HOSPITAL_COMMUNITY): Payer: 59

## 2022-02-22 ENCOUNTER — Encounter (HOSPITAL_COMMUNITY): Payer: Self-pay | Admitting: Licensed Clinical Social Worker

## 2022-02-22 VITALS — BP 124/80 | HR 70 | Temp 96.8°F | Resp 18

## 2022-02-22 DIAGNOSIS — D649 Anemia, unspecified: Secondary | ICD-10-CM

## 2022-02-22 DIAGNOSIS — E876 Hypokalemia: Secondary | ICD-10-CM

## 2022-02-22 DIAGNOSIS — C180 Malignant neoplasm of cecum: Secondary | ICD-10-CM

## 2022-02-22 DIAGNOSIS — Z5111 Encounter for antineoplastic chemotherapy: Secondary | ICD-10-CM | POA: Diagnosis not present

## 2022-02-22 DIAGNOSIS — Z95828 Presence of other vascular implants and grafts: Secondary | ICD-10-CM

## 2022-02-22 DIAGNOSIS — C189 Malignant neoplasm of colon, unspecified: Secondary | ICD-10-CM

## 2022-02-22 LAB — CBC WITH DIFFERENTIAL/PLATELET
Abs Immature Granulocytes: 0.05 10*3/uL (ref 0.00–0.07)
Basophils Absolute: 0.1 10*3/uL (ref 0.0–0.1)
Basophils Relative: 1 %
Eosinophils Absolute: 0.4 10*3/uL (ref 0.0–0.5)
Eosinophils Relative: 6 %
HCT: 34.4 % — ABNORMAL LOW (ref 39.0–52.0)
Hemoglobin: 11.1 g/dL — ABNORMAL LOW (ref 13.0–17.0)
Immature Granulocytes: 1 %
Lymphocytes Relative: 27 %
Lymphs Abs: 1.9 10*3/uL (ref 0.7–4.0)
MCH: 28 pg (ref 26.0–34.0)
MCHC: 32.3 g/dL (ref 30.0–36.0)
MCV: 86.6 fL (ref 80.0–100.0)
Monocytes Absolute: 0.7 10*3/uL (ref 0.1–1.0)
Monocytes Relative: 10 %
Neutro Abs: 4 10*3/uL (ref 1.7–7.7)
Neutrophils Relative %: 55 %
Platelets: 282 10*3/uL (ref 150–400)
RBC: 3.97 MIL/uL — ABNORMAL LOW (ref 4.22–5.81)
RDW: 13.2 % (ref 11.5–15.5)
WBC: 7.1 10*3/uL (ref 4.0–10.5)
nRBC: 0 % (ref 0.0–0.2)

## 2022-02-22 LAB — COMPREHENSIVE METABOLIC PANEL
ALT: 11 U/L (ref 0–44)
AST: 21 U/L (ref 15–41)
Albumin: 2.9 g/dL — ABNORMAL LOW (ref 3.5–5.0)
Alkaline Phosphatase: 46 U/L (ref 38–126)
Anion gap: 10 (ref 5–15)
BUN: 10 mg/dL (ref 8–23)
CO2: 25 mmol/L (ref 22–32)
Calcium: 8.7 mg/dL — ABNORMAL LOW (ref 8.9–10.3)
Chloride: 100 mmol/L (ref 98–111)
Creatinine, Ser: 0.89 mg/dL (ref 0.61–1.24)
GFR, Estimated: >60 mL/min (ref >60)
Glucose, Bld: 145 mg/dL — ABNORMAL HIGH (ref 70–99)
Potassium: 3.4 mmol/L — ABNORMAL LOW (ref 3.5–5.1)
Sodium: 135 mmol/L (ref 135–145)
Total Bilirubin: 0.6 mg/dL (ref 0.3–1.2)
Total Protein: 7.4 g/dL (ref 6.5–8.1)

## 2022-02-22 LAB — IRON AND TIBC
Iron: 25 ug/dL — ABNORMAL LOW (ref 45–182)
Saturation Ratios: 11 % — ABNORMAL LOW (ref 17.9–39.5)
TIBC: 237 ug/dL — ABNORMAL LOW (ref 250–450)
UIBC: 212 ug/dL

## 2022-02-22 LAB — VITAMIN B12: Vitamin B-12: 175 pg/mL — ABNORMAL LOW (ref 180–914)

## 2022-02-22 LAB — FOLATE: Folate: 9.9 ng/mL (ref >5.9)

## 2022-02-22 LAB — MAGNESIUM: Magnesium: 1.9 mg/dL (ref 1.7–2.4)

## 2022-02-22 LAB — FERRITIN: Ferritin: 292 ng/mL (ref 24–336)

## 2022-02-22 MED ORDER — SODIUM CHLORIDE 0.9 % IV SOLN
5000.0000 mg | INTRAVENOUS | Status: DC
  Administered 2022-02-22: 5000 mg via INTRAVENOUS
  Filled 2022-02-22: qty 100

## 2022-02-22 MED ORDER — POTASSIUM CHLORIDE CRYS ER 20 MEQ PO TBCR
40.0000 meq | EXTENDED_RELEASE_TABLET | Freq: Once | ORAL | Status: AC
  Administered 2022-02-22: 40 meq via ORAL
  Filled 2022-02-22: qty 2

## 2022-02-22 MED ORDER — SODIUM CHLORIDE 0.9 % IV SOLN
400.0000 mg/m2 | Freq: Once | INTRAVENOUS | Status: AC
  Administered 2022-02-22: 792 mg via INTRAVENOUS
  Filled 2022-02-22: qty 39.6

## 2022-02-22 MED ORDER — SODIUM CHLORIDE 0.9 % IV SOLN
180.0000 mg/m2 | Freq: Once | INTRAVENOUS | Status: AC
  Administered 2022-02-22: 360 mg via INTRAVENOUS
  Filled 2022-02-22: qty 15

## 2022-02-22 MED ORDER — SODIUM CHLORIDE 0.9 % IV SOLN
10.0000 mg | Freq: Once | INTRAVENOUS | Status: AC
  Administered 2022-02-22: 10 mg via INTRAVENOUS
  Filled 2022-02-22: qty 10

## 2022-02-22 MED ORDER — FLUOROURACIL CHEMO INJECTION 2.5 GM/50ML
400.0000 mg/m2 | Freq: Once | INTRAVENOUS | Status: AC
  Administered 2022-02-22: 800 mg via INTRAVENOUS
  Filled 2022-02-22: qty 16

## 2022-02-22 MED ORDER — SODIUM CHLORIDE 0.9 % IV SOLN: Freq: Once | INTRAVENOUS | Status: AC

## 2022-02-22 MED ORDER — ATROPINE SULFATE 1 MG/ML IV SOLN
0.5000 mg | Freq: Once | INTRAVENOUS | Status: AC | PRN
  Administered 2022-02-22: 0.5 mg via INTRAVENOUS
  Filled 2022-02-22: qty 0.5

## 2022-02-22 MED ORDER — PALONOSETRON HCL INJECTION 0.25 MG/5ML
0.2500 mg | Freq: Once | INTRAVENOUS | Status: AC
  Administered 2022-02-22: 0.25 mg via INTRAVENOUS
  Filled 2022-02-22: qty 5

## 2022-02-22 NOTE — Progress Notes (Signed)

## 2022-02-22 NOTE — Patient Instructions (Signed)
[facility]  Discharge Instructions: Thank you for choosing [facility] to provide your oncology and hematology care.  If you have a lab appointment with the Cancer Center, please come in thru the Main Entrance and check in at the main information desk.  Wear comfortable clothing and clothing appropriate for easy access to any Portacath or PICC line.   We strive to give you quality time with your provider. You may need to reschedule your appointment if you arrive late (15 or more minutes).  Arriving late affects you and other patients whose appointments are after yours.  Also, if you miss three or more appointments without notifying the office, you may be dismissed from the clinic at the provider's discretion.      For prescription refill requests, have your pharmacy contact our office and allow 72 hours for refills to be completed.        To help prevent nausea and vomiting after your treatment, we encourage you to take your nausea medication as directed.  BELOW ARE SYMPTOMS THAT SHOULD BE REPORTED IMMEDIATELY: *FEVER GREATER THAN 100.4 F (38 C) OR HIGHER *CHILLS OR SWEATING *NAUSEA AND VOMITING THAT IS NOT CONTROLLED WITH YOUR NAUSEA MEDICATION *UNUSUAL SHORTNESS OF BREATH *UNUSUAL BRUISING OR BLEEDING *URINARY PROBLEMS (pain or burning when urinating, or frequent urination) *BOWEL PROBLEMS (unusual diarrhea, constipation, pain near the anus) TENDERNESS IN MOUTH AND THROAT WITH OR WITHOUT PRESENCE OF ULCERS (sore throat, sores in mouth, or a toothache) UNUSUAL RASH, SWELLING OR PAIN  UNUSUAL VAGINAL DISCHARGE OR ITCHING   Items with * indicate a potential emergency and should be followed up as soon as possible or go to the Emergency Department if any problems should occur.  Please show the CHEMOTHERAPY ALERT CARD or IMMUNOTHERAPY ALERT CARD at check-in to the Emergency Department and triage nurse.  Should you have questions after your visit or need to cancel  or reschedule your appointment, please contact [facility] [phone]  and follow the prompts.  Office hours are 8:00 a.m. to 4:30 p.m. Monday - Friday. Please note that voicemails left after 4:00 p.m. may not be returned until the following business day.  We are closed weekends and major holidays. You have access to a nurse at all times for urgent questions. Please call the main number to the clinic [phone] and follow the prompts.  For any non-urgent questions, you may also contact your provider using MyChart. We now offer e-Visits for anyone 18 and older to request care online for non-urgent symptoms. For details visit [email or web address].   Also download the MyChart app! Go to the app store, search "MyChart", open the app, select [facility], and log in with your MyChart username and password.  Due to Covid, a mask is required upon entering the hospital/clinic. If you do not have a mask, one will be given to you upon arrival. For doctor visits, patients may have 1 support person aged 18 or older with them. For treatment visits, patients cannot have anyone with them due to current Covid guidelines and our immunocompromised population.  

## 2022-02-22 NOTE — Progress Notes (Signed)
Patient presents today for chemotherapy infusion.  Patient is in satisfactory condition with no complaints voiced.  Vital signs are stable.  Chemotherapy teaching and consent signing done today by Anastasio Champion, RN.  Labs reviewed.  All labs are within treatment parameters.  Potassium today is 3.4.  I will give Klor Con 40 mEq PO x one dose per Dr. Tomie China standing orders.  We will proceed with treatment per MD orders.  ? ?Patient tolerated treatment well with no complaints voiced.  Education done on home infusion 5FU pump.  Patient voiced understanding.  Patient left ambulatory in stable condition.  Vital signs stable at discharge.  Follow up as scheduled.    ?

## 2022-02-22 NOTE — Progress Notes (Signed)
[facility] Clinical Social Work  ?Initial Assessment ? ? ?Evan [name] is a 66 y.o. year old male accompanied by patient and spouse. Clinical Social Work was referred by nurse navigator for assessment of psychosocial needs.  ? ?SDOH (Social Determinants of Health) assessments performed: Yes ?  ?Distress Screen completed: No ?No flowsheet data found. ? ? ? ?Family/Social Information:  ?Housing Arrangement: patient lives with spouse ?Family members/support persons in your life? Family ?Transportation concerns: no  ?Employment: Working part time. Income source: Employment ?Financial concerns:  at present there are no financial concerns; however, pt will keep CSW informed as treatment progresses as he will need to cut back on work. ?Type of concern:  none at present ?Food access concerns: no ?Religious or spiritual practice: Pt states church has always been a very important part of his life and he has always been very involved; however, for the past month pt states he has not gone to church because of the way people look at him now that he is sick.  Pt became tearful during discussion regarding support and faith.  Emotional support provided. ?Services Currently in place:  none at this time ? ?Coping/ Adjustment to diagnosis: ?Patient understands treatment plan and what happens next? yes ?Concerns about diagnosis and/or treatment:  Pt states he initially was informed he had stage IV lung cancer with a poor prognosis and then following the biopsy it was determined to be metastatic colon cancer from his previous cancer with a slightly better prognosis.  Pt states he is more optimistic than he initially was, but is worried he may not survive treatment. ?Patient reported stressors: Adjusting to my illness ?Hopes and priorities: Pt would like to be able to continue to work through treatment and is hopeful he will survive long enough to see his daughter (the couple's youngest of 17) graduate from college. ?Patient enjoys  Pt  states he enjoys work, church and very much enjoyed spending time with his 3 grandchildren who recently moved to [place]. ?Current coping skills/ strengths: Other: pt is determined to continue to work through treatment as he believes it provides him with a sense of purpose and is a place he is not focused on his diagnosis. ? ? ? SUMMARY: ?Current SDOH Barriers:  ?Pt is isolating himself a bit as he is uncomfortable with the way people look at him knowing he has cancer. ? ?Clinical Social Work Clinical Goal(s):  ?CSW to continue to follow pt through treatment to address any concerns which may arise. ? ?Interventions: ?Discussed common feeling and emotions when being diagnosed with cancer, and the importance of support during treatment ?Informed patient of the support team roles and support services at [facility] ?Provided CSW contact information and encouraged patient to call with any questions or concerns ? ? ? ?Follow Up Plan: CSW will follow-up with patient by phone  ?Patient verbalizes understanding of plan: Yes ? ? ? ?Henriette Combs, LCSW ?

## 2022-02-23 ENCOUNTER — Encounter (HOSPITAL_COMMUNITY): Payer: Self-pay

## 2022-02-23 ENCOUNTER — Telehealth (HOSPITAL_COMMUNITY): Payer: Self-pay

## 2022-02-23 LAB — CEA: CEA: 10.9 ng/mL — ABNORMAL HIGH (ref 0.0–4.7)

## 2022-02-23 NOTE — Telephone Encounter (Signed)
Chemotherapy 24 hour follow up call.  Spoke with the patient and stated he is doing great.  Denied any side effects from the treatment.  Reviewed telephone numbers to call with understanding verbalized.   ?

## 2022-02-24 ENCOUNTER — Other Ambulatory Visit: Payer: Self-pay

## 2022-02-24 ENCOUNTER — Inpatient Hospital Stay (HOSPITAL_COMMUNITY): Payer: 59

## 2022-02-24 VITALS — BP 126/80 | HR 70 | Temp 96.7°F | Resp 20

## 2022-02-24 DIAGNOSIS — Z5111 Encounter for antineoplastic chemotherapy: Secondary | ICD-10-CM | POA: Diagnosis not present

## 2022-02-24 DIAGNOSIS — C180 Malignant neoplasm of cecum: Secondary | ICD-10-CM

## 2022-02-24 DIAGNOSIS — Z95828 Presence of other vascular implants and grafts: Secondary | ICD-10-CM

## 2022-02-24 MED ORDER — SODIUM CHLORIDE 0.9% FLUSH
10.0000 mL | INTRAVENOUS | Status: DC | PRN
  Administered 2022-02-24: 10 mL

## 2022-02-24 MED ORDER — HEPARIN SOD (PORK) LOCK FLUSH 100 UNIT/ML IV SOLN
500.0000 [IU] | Freq: Once | INTRAVENOUS | Status: AC | PRN
  Administered 2022-02-24: 500 [IU]

## 2022-02-24 NOTE — Progress Notes (Signed)
Evan [name] presents to have home infusion pump d/c'd and for port-a-cath deaccess with flush.  Portacath located left chest wall accessed with  H 20 needle.  Good blood return present. Portacath flushed with NS and 500U/34ml Heparin, and needle removed intact.  Procedure tolerated well and without incident.  ?

## 2022-02-27 ENCOUNTER — Other Ambulatory Visit (HOSPITAL_COMMUNITY): Payer: Self-pay

## 2022-02-27 MED ORDER — ALUMINUM & MAGNESIUM HYDROXIDE 200-200 MG/5ML PO SUSP: 10.0000 mL | Freq: Four times a day (QID) | ORAL | 0 refills | Status: DC | PRN

## 2022-02-27 MED ORDER — FAMOTIDINE 20 MG PO TABS: 20.0000 mg | ORAL_TABLET | Freq: Every day | ORAL | 3 refills | Status: DC

## 2022-02-27 NOTE — Telephone Encounter (Signed)
Patient called reporting acid reflux. Patient states that the nausea medication eases nausea but does nothing for the acid reflux. Order received from Maalox PRN and Pepcid 20mg  daily. Patient aware and verbalized understanding. ?

## 2022-03-07 ENCOUNTER — Inpatient Hospital Stay (HOSPITAL_COMMUNITY): Payer: 59

## 2022-03-07 ENCOUNTER — Other Ambulatory Visit: Payer: Self-pay

## 2022-03-07 ENCOUNTER — Inpatient Hospital Stay (HOSPITAL_BASED_OUTPATIENT_CLINIC_OR_DEPARTMENT_OTHER): Payer: 59 | Admitting: Hematology

## 2022-03-07 VITALS — BP 105/71 | HR 59 | Temp 97.6°F | Resp 18

## 2022-03-07 DIAGNOSIS — C189 Malignant neoplasm of colon, unspecified: Secondary | ICD-10-CM

## 2022-03-07 DIAGNOSIS — Z95828 Presence of other vascular implants and grafts: Secondary | ICD-10-CM

## 2022-03-07 DIAGNOSIS — C180 Malignant neoplasm of cecum: Secondary | ICD-10-CM

## 2022-03-07 DIAGNOSIS — Z5111 Encounter for antineoplastic chemotherapy: Secondary | ICD-10-CM | POA: Diagnosis not present

## 2022-03-07 LAB — COMPREHENSIVE METABOLIC PANEL
ALT: 11 U/L (ref 0–44)
AST: 18 U/L (ref 15–41)
Albumin: 3.1 g/dL — ABNORMAL LOW (ref 3.5–5.0)
Alkaline Phosphatase: 49 U/L (ref 38–126)
Anion gap: 6 (ref 5–15)
BUN: 12 mg/dL (ref 8–23)
CO2: 25 mmol/L (ref 22–32)
Calcium: 8.6 mg/dL — ABNORMAL LOW (ref 8.9–10.3)
Chloride: 105 mmol/L (ref 98–111)
Creatinine, Ser: 1.04 mg/dL (ref 0.61–1.24)
GFR, Estimated: >60 mL/min (ref >60)
Glucose, Bld: 98 mg/dL (ref 70–99)
Potassium: 3.4 mmol/L — ABNORMAL LOW (ref 3.5–5.1)
Sodium: 136 mmol/L (ref 135–145)
Total Bilirubin: 0.4 mg/dL (ref 0.3–1.2)
Total Protein: 7.2 g/dL (ref 6.5–8.1)

## 2022-03-07 LAB — CBC WITH DIFFERENTIAL/PLATELET
Abs Immature Granulocytes: 0.01 10*3/uL (ref 0.00–0.07)
Basophils Absolute: 0.1 10*3/uL (ref 0.0–0.1)
Basophils Relative: 1 %
Eosinophils Absolute: 0.3 10*3/uL (ref 0.0–0.5)
Eosinophils Relative: 6 %
HCT: 31.4 % — ABNORMAL LOW (ref 39.0–52.0)
Hemoglobin: 10.2 g/dL — ABNORMAL LOW (ref 13.0–17.0)
Immature Granulocytes: 0 %
Lymphocytes Relative: 46 %
Lymphs Abs: 2.4 10*3/uL (ref 0.7–4.0)
MCH: 28.3 pg (ref 26.0–34.0)
MCHC: 32.5 g/dL (ref 30.0–36.0)
MCV: 87 fL (ref 80.0–100.0)
Monocytes Absolute: 0.4 10*3/uL (ref 0.1–1.0)
Monocytes Relative: 8 %
Neutro Abs: 2.1 10*3/uL (ref 1.7–7.7)
Neutrophils Relative %: 39 %
Platelets: 238 10*3/uL (ref 150–400)
RBC: 3.61 MIL/uL — ABNORMAL LOW (ref 4.22–5.81)
RDW: 13.5 % (ref 11.5–15.5)
WBC: 5.3 10*3/uL (ref 4.0–10.5)
nRBC: 0 % (ref 0.0–0.2)

## 2022-03-07 LAB — MAGNESIUM: Magnesium: 1.8 mg/dL (ref 1.7–2.4)

## 2022-03-07 MED ORDER — SODIUM CHLORIDE 0.9 % IV SOLN
10.0000 mg | Freq: Once | INTRAVENOUS | Status: AC
  Administered 2022-03-07: 10 mg via INTRAVENOUS
  Filled 2022-03-07: qty 10

## 2022-03-07 MED ORDER — SODIUM CHLORIDE 0.9% FLUSH
10.0000 mL | INTRAVENOUS | Status: DC | PRN
  Administered 2022-03-07: 10 mL

## 2022-03-07 MED ORDER — VITAMIN B-12 1000 MCG PO TABS: 1000.0000 ug | ORAL_TABLET | Freq: Every day | ORAL | 6 refills | Status: DC

## 2022-03-07 MED ORDER — SODIUM CHLORIDE 0.9 % IV SOLN
5000.0000 mg | INTRAVENOUS | Status: DC
  Administered 2022-03-07: 5000 mg via INTRAVENOUS
  Filled 2022-03-07: qty 100

## 2022-03-07 MED ORDER — FLUOROURACIL CHEMO INJECTION 2.5 GM/50ML
400.0000 mg/m2 | Freq: Once | INTRAVENOUS | Status: AC
  Administered 2022-03-07: 800 mg via INTRAVENOUS
  Filled 2022-03-07: qty 16

## 2022-03-07 MED ORDER — PALONOSETRON HCL INJECTION 0.25 MG/5ML
0.2500 mg | Freq: Once | INTRAVENOUS | Status: AC
  Administered 2022-03-07: 0.25 mg via INTRAVENOUS
  Filled 2022-03-07: qty 5

## 2022-03-07 MED ORDER — ATROPINE SULFATE 1 MG/ML IV SOLN
0.5000 mg | Freq: Once | INTRAVENOUS | Status: AC | PRN
  Administered 2022-03-07: 0.5 mg via INTRAVENOUS
  Filled 2022-03-07: qty 1

## 2022-03-07 MED ORDER — SODIUM CHLORIDE 0.9 % IV SOLN
180.0000 mg/m2 | Freq: Once | INTRAVENOUS | Status: AC
  Administered 2022-03-07: 360 mg via INTRAVENOUS
  Filled 2022-03-07: qty 4

## 2022-03-07 MED ORDER — SODIUM CHLORIDE 0.9 % IV SOLN: Freq: Once | INTRAVENOUS | Status: AC

## 2022-03-07 MED ORDER — SODIUM CHLORIDE 0.9 % IV SOLN
400.0000 mg/m2 | Freq: Once | INTRAVENOUS | Status: AC
  Administered 2022-03-07: 792 mg via INTRAVENOUS
  Filled 2022-03-07: qty 39.6

## 2022-03-07 NOTE — Progress Notes (Signed)
Patient has been examined by Dr. Katragadda, and vital signs and labs have been reviewed. ANC, Creatinine, LFTs, hemoglobin, and platelets are within treatment parameters per M.D. - pt may proceed with treatment.    °

## 2022-03-07 NOTE — Patient Instructions (Signed)
[place] at [facility] ?Discharge Instructions ? ? ?You were seen and examined today by Dr. Delton Coombes. ? ?He reviewed your lab work.  Your B12 was low.  We will give you a B12 injection today. Dr. Raliegh Ip has sent a prescription for B12 to your pharmacy.  You will take 1 tablet daily. All other lab work was normal/stable. ? ?We will proceed with your treatment today.  ? ?Return as scheduled in 2 weeks.  ? ? ?Thank you for choosing [place] at [facility] to provide your oncology and hematology care.  To afford each patient quality time with our provider, please arrive at least 15 minutes before your scheduled appointment time.  ? ?If you have a lab appointment with the [facility] please come in thru the Main Entrance and check in at the main information desk. ? ?You need to re-schedule your appointment should you arrive 10 or more minutes late.  We strive to give you quality time with our providers, and arriving late affects you and other patients whose appointments are after yours.  Also, if you no show three or more times for appointments you may be dismissed from the clinic at the providers discretion.     ?Again, thank you for choosing [facility].  Our hope is that these requests will decrease the amount of time that you wait before being seen by our physicians.       ?_____________________________________________________________ ? ?Should you have questions after your visit to [facility], please contact our office at [phone] and follow the prompts.  Our office hours are 8:00 a.m. and 4:30 p.m. Monday - Friday.  Please note that voicemails left after 4:00 p.m. may not be returned until the following business day.  We are closed weekends and major holidays.  You do have access to a nurse 24-7, just call the main number to the clinic [phone] and do not press any options, hold on the line and a nurse will answer the phone.    ? ?For prescription refill requests, have your pharmacy contact our office and allow 72 hours.   ? ?Due to Covid, you will need to wear a mask upon entering the hospital. If you do not have a mask, a mask will be given to you at the Main Entrance upon arrival. For doctor visits, patients may have 1 support person age 52 or older with them. For treatment visits, patients can not have anyone with them due to social distancing guidelines and our immunocompromised population.  ? ?   ?

## 2022-03-07 NOTE — Progress Notes (Signed)
Patient presents today for FOLFIRI, patient and labs assessed today by Dr. Delton Coombes, per MD patient okay for treatment today, no Avastin d/t patient coughing up some blood. Patient tolerated chemotherapy with no complaints voiced. Side effects with management reviewed understanding verbalized. Port site clean and dry with no bruising or swelling noted at site. Good blood return noted before and after administration of chemotherapy. Band aid applied. Patient left in satisfactory condition with VSS and no s/s of distress noted.  ?

## 2022-03-07 NOTE — Progress Notes (Signed)
? ?[place] ?[address] ?[place], [place] [address] ? ? ?CLINIC:  ?Medical Oncology/Hematology ? ?PCP:  ?Lavella Lemons, PA ?[address] [place] [address] ?[phone] ? ? ?REASON FOR VISIT:  ?Follow-up for left lung mass ? ?PRIOR THERAPY:  ?Right hemicolectomy in May 2018 ?3 cycles of XELOX followed by Xeloda for total of 6 months  ?Oxaliplatin discontinued during cycle 4 secondary to transaminitis, elevated bilirubin and thrombocytopenia ? ?NGS Results: not done ? ?CURRENT THERAPY: FOLFIRI / BEVACIZUMAB Q14D ? 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  ?10/07/2019 Cancer Staging  ? Staging form: Colon and Rectum, AJCC 8th Edition ?- Clinical stage from 10/07/2019: Stage IIIB (cT3, cN1, cM0) - Signed by Derek Jack, MD on 10/07/2019 ? ?  ?02/22/2022 -  Chemotherapy  ? Patient is on Treatment Plan : COLORECTAL FOLFIRI / BEVACIZUMAB Q14D  ?   ? ? ?CANCER STAGING: ? 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 ? ?INTERVAL HISTORY:  ?[name], a 66 y.o. male, returns for routine follow-up and consideration for next cycle of chemotherapy. Evan [name] was last seen on 02/15/2022. ? ?Due for cycle #2 of FOLFIRI / BEVACIZUMAB today.  ? ?Overall, he tells me he has been feeling pretty well. He reports CP, acid reflux, and nausea starting around 3/20. The nausea was helped by compazine. He reports CP can be burning or sharp intermittently, and he has had this pain prior to treatment start. He denies nosebleeds, and he reports hemoptysis which has improved and now is pink or red tinged sputum.  ? ?Overall, he feels ready for next cycle of chemo today.  ? ?REVIEW OF SYSTEMS:   ?Review of Systems  ?Constitutional:  Positive for fatigue. Negative for appetite change.  ?HENT:   Negative for nosebleeds.   ?Respiratory:  Positive for cough, hemoptysis and shortness of breath.   ?Cardiovascular:  Positive for chest pain (3/10).  ?Gastrointestinal:  Positive for constipation, diarrhea, nausea and vomiting.  ?Neurological:  Positive for dizziness and headaches.  ?All other systems reviewed and are negative. ? ?PAST MEDICAL/SURGICAL HISTORY:  ?Past Medical History:  ?Diagnosis Date  ? Arthritis   ? Colon cancer ([place])   ? colon  ? Hepatitis C   ? Hypertension   ? Port-A-Cath in place 02/15/2022  ? ?Past Surgical History:  ?Procedure Laterality Date  ? COLON SURGERY    ? PORTACATH PLACEMENT Right 02/08/2022  ? Procedure: INSERTION PORT-A-CATH- RIJ;  Surgeon: Rusty Aus, DO;  Location: [facility];  Service: General;  Laterality: Right;  ? REPLACEMENT TOTAL KNEE Left   ? SHOULDER ARTHROSCOPY Bilateral   ? TOTAL HIP ARTHROPLASTY Right 11/09/2020  ? Procedure: TOTAL HIP ARTHROPLASTY ANTERIOR APPROACH;  Surgeon: Renette Butters, MD;  Location: [facility];  Service: Orthopedics;  Laterality: Right;  ? WRIST SURGERY Left   ? ? ?SOCIAL HISTORY:  ?Social History  ? ?Socioeconomic History  ? Marital status: Married  ?  Spouse name: Not on file  ? Number of children: 7  ? Years of education: Not on file  ? Highest education level: Not on file  ?Occupational History  ? Occupation: employed  ?Tobacco Use  ? Smoking  status: Former  ?  Packs/day: 1.50  ?  Years: 20.00  ?  Pack years: 30.00  ?  Types: Cigarettes  ?  Quit date: 11/19/2005  ?  Years since quitting: 16.3  ? Smokeless tobacco: Never  ?Vaping Use  ? Vaping Use: Never used  ?Substance and Sexual Activity  ? Alcohol use: Not Currently  ? Drug use: Never  ? Sexual activity: Yes  ?Other Topics Concern  ? Not on file  ?Social History Narrative  ? ** Merged History Encounter **  ?    ? Separated from wife 11/2021  ? ?Social Determinants of Health   ? ?Financial Resource Strain: Not on file  ?Food Insecurity: Not on file  ?Transportation Needs: Not on file  ?Physical Activity: Not on file  ?Stress: Not on file  ?Social Connections: Not on file  ?Intimate Partner Violence: Not on file  ? ? ?FAMILY HISTORY:  ?Family History  ?Problem Relation Age of Onset  ? Heart disease Mother   ? Dementia Father   ? Heart disease Sister   ? Cancer Brother   ? Hypertension Brother   ? Hypertension Brother   ? Healthy Son   ? Healthy Son   ? Healthy Son   ? Healthy Son   ? Healthy Daughter   ? Healthy Daughter   ? Healthy Daughter   ? ? ?CURRENT MEDICATIONS:  ?Current Outpatient Medications  ?Medication Sig Dispense Refill  ? aluminum-magnesium hydroxide 200-200 MG/5ML suspension Take 10 mLs by mouth every 6 (six) hours as needed for indigestion. 355 mL 0  ? Bevacizumab (AVASTIN IV) Inject into the vein every 14 (fourteen) days. *start date TBD    ? famotidine (PEPCID) 20 MG tablet Take 1 tablet (20 mg total) by mouth daily. 30 tablet 3  ? fluorouracil CALGB 84132 2,400 mg/m2 in sodium chloride 0.9 % 150 mL Inject 2,400 mg/m2 into the vein over 48 hr.    ? FLUOROURACIL IV Inject into the vein every 14 (fourteen) days.    ? IRINOTECAN HCL IV Inject into the vein every 14 (fourteen) days.    ? LEUCOVORIN CALCIUM IV Inject into the vein every 14 (fourteen) days.    ? lidocaine-prilocaine (EMLA) cream Apply a small amount to port a cath site (do not rub in) and cover with plastic wrap 1 hour prior to infusion appointments 30 g 3  ? naproxen sodium (ALEVE) 220 MG tablet Take 220-440 mg by mouth daily as needed (pain).    ? oxyCODONE (ROXICODONE) 5 MG immediate release tablet Take 1 tablet (5 mg total) by mouth every 8 (eight) hours as needed. 10 tablet 0  ? prochlorperazine (COMPAZINE) 10 MG tablet Take 1 tablet (10 mg total) by mouth every 6 (six) hours as needed (NAUSEA). 30 tablet 1  ? vitamin B-12 (CYANOCOBALAMIN) 1000 MCG tablet Take 1 tablet (1,000 mcg total) by mouth daily.  30 tablet 6  ? ?No current facility-administered medications for this visit.  ? ? ?ALLERGIES:  ?No Known Allergies ? ?PHYSICAL EXAM:  ?Performance status (ECOG): 0 - Asymptomatic ? ?There were no vitals filed for this visit. ?Wt Readings from Last 3 Encounters:  ?03/07/22 174 lb 9.6 oz (79.2 kg)  ?02/22/22 169 lb 14.4 oz (77.1 kg)  ?02/21/22 170 lb (77.1 kg)  ? ?Physical Exam ?Vitals reviewed.  ?Constitutional:   ?   Appearance: Normal appearance.  ?Cardiovascular:  ?   Rate and Rhythm: Normal rate and regular rhythm.  ?   Pulses: Normal pulses.  ?  Heart sounds: Normal heart sounds.  ?Pulmonary:  ?   Effort: Pulmonary effort is normal.  ?   Breath sounds: Normal breath sounds.  ?Chest:  ?   Chest wall: No tenderness.  ?Neurological:  ?   General: No focal deficit present.  ?   Mental Status: He is alert and oriented to person, place, and time.  ?Psychiatric:     ?   Mood and Affect: Mood normal.     ?   Behavior: Behavior normal.  ? ? ?LABORATORY DATA:  ?I have reviewed the labs as listed.  ? ?  Latest Ref Rng & Units 03/07/2022  ?  9:15 AM 02/22/2022  ?  8:22 AM 01/17/2022  ?  9:48 AM  ?CBC  ?WBC 4.0 - 10.5 K/uL 5.3   7.1   6.3    ?Hemoglobin 13.0 - 17.0 g/dL 10.2   11.1   11.7    ?Hematocrit 39.0 - 52.0 % 31.4   34.4   35.6    ?Platelets 150 - 400 K/uL 238   282   253    ? ? ?  Latest Ref Rng & Units 03/07/2022  ?  9:15 AM 02/22/2022  ?  8:22 AM 12/26/2021  ? 10:10 AM  ?CMP  ?Glucose 70 - 99 mg/dL 98   145   94    ?BUN 8 - 23 mg/dL 12   10   19     ?Creatinine 0.61 - 1.24 mg/dL 1.04   0.89   0.89    ?Sodium 135 - 145 mmol/L 136   135   134    ?Potassium 3.5 - 5.1 mmol/L 3.4   3.4   4.2    ?Chloride 98 - 111 mmol/L 105   100   101    ?CO2 22 - 32 mmol/L 25   25   26     ?Calcium 8.9 - 10.3 mg/dL 8.6   8.7   9.1    ?Total Protein 6.5 - 8.1 g/dL 7.2   7.4   7.7    ?Total Bilirubin 0.3 - 1.2 mg/dL 0.4   0.6   0.5    ?Alkaline Phos 38 - 126 U/L 49   46   48    ?AST 15 - 41 U/L 18   21   18     ?ALT 0 - 44 U/L 11   11   9      ? ? ?DIAGNOSTIC IMAGING:  ?I have independently reviewed the scans and discussed with the patient. ?DG Chest Port 1 View ? ?Result Date: 02/08/2022 ?CLINICAL DATA:  Provided history: Port-A-Cath in place. EXAM: PORTABL

## 2022-03-07 NOTE — Patient Instructions (Signed)
[facility]  Discharge Instructions: ?Thank you for choosing [facility] to provide your oncology and hematology care.  ?If you have a lab appointment with the [facility], please come in thru the Main Entrance and check in at the main information desk. ? ?Wear comfortable clothing and clothing appropriate for easy access to any Portacath or PICC line.  ? ?We strive to give you quality time with your provider. You may need to reschedule your appointment if you arrive late (15 or more minutes).  Arriving late affects you and other patients whose appointments are after yours.  Also, if you miss three or more appointments without notifying the office, you may be dismissed from the clinic at the provider?s discretion.    ?  ?For prescription refill requests, have your pharmacy contact our office and allow 72 hours for refills to be completed.   ? ?Today you received the following chemotherapy and/or immunotherapy agents FOLFIRI, return as scheduled. ?  ?To help prevent nausea and vomiting after your treatment, we encourage you to take your nausea medication as directed. ? ?BELOW ARE SYMPTOMS THAT SHOULD BE REPORTED IMMEDIATELY: ?*FEVER GREATER THAN 100.4 F (38 ?C) OR HIGHER ?*CHILLS OR SWEATING ?*NAUSEA AND VOMITING THAT IS NOT CONTROLLED WITH YOUR NAUSEA MEDICATION ?*UNUSUAL SHORTNESS OF BREATH ?*UNUSUAL BRUISING OR BLEEDING ?*URINARY PROBLEMS (pain or burning when urinating, or frequent urination) ?*BOWEL PROBLEMS (unusual diarrhea, constipation, pain near the anus) ?TENDERNESS IN MOUTH AND THROAT WITH OR WITHOUT PRESENCE OF ULCERS (sore throat, sores in mouth, or a toothache) ?UNUSUAL RASH, SWELLING OR PAIN  ?UNUSUAL VAGINAL DISCHARGE OR ITCHING  ? ?Items with * indicate a potential emergency and should be followed up as soon as possible or go to the Emergency Department if any problems should occur. ? ?Please show the CHEMOTHERAPY ALERT CARD or IMMUNOTHERAPY ALERT CARD at check-in to the  Emergency Department and triage nurse. ? ?Should you have questions after your visit or need to cancel or reschedule your appointment, please contact [facility] [phone]  and follow the prompts.  Office hours are 8:00 a.m. to 4:30 p.m. Monday - Friday. Please note that voicemails left after 4:00 p.m. may not be returned until the following business day.  We are closed weekends and major holidays. You have access to a nurse at all times for urgent questions. Please call the main number to the clinic [phone] and follow the prompts. ? ?For any non-urgent questions, you may also contact your provider using MyChart. We now offer e-Visits for anyone 98 and older to request care online for non-urgent symptoms. For details visit [email or web address]. ?  ?Also download the MyChart app! Go to the app store, search "MyChart", open the app, select [facility], and log in with your MyChart username and password. ? ?Due to Covid, a mask is required upon entering the hospital/clinic. If you do not have a mask, one will be given to you upon arrival. For doctor visits, patients may have 1 support person aged 57 or older with them. For treatment visits, patients cannot have anyone with them due to current Covid guidelines and our immunocompromised population.  ?

## 2022-03-09 ENCOUNTER — Inpatient Hospital Stay (HOSPITAL_COMMUNITY): Payer: 59

## 2022-03-09 ENCOUNTER — Encounter (HOSPITAL_COMMUNITY): Payer: Self-pay

## 2022-03-09 VITALS — BP 126/79 | HR 60 | Temp 96.6°F | Resp 18

## 2022-03-09 DIAGNOSIS — Z95828 Presence of other vascular implants and grafts: Secondary | ICD-10-CM

## 2022-03-09 DIAGNOSIS — Z5111 Encounter for antineoplastic chemotherapy: Secondary | ICD-10-CM | POA: Diagnosis not present

## 2022-03-09 DIAGNOSIS — C180 Malignant neoplasm of cecum: Secondary | ICD-10-CM

## 2022-03-09 MED ORDER — SODIUM CHLORIDE 0.9% FLUSH
10.0000 mL | INTRAVENOUS | Status: DC | PRN
  Administered 2022-03-09: 10 mL

## 2022-03-09 MED ORDER — HEPARIN SOD (PORK) LOCK FLUSH 100 UNIT/ML IV SOLN
500.0000 [IU] | Freq: Once | INTRAVENOUS | Status: AC | PRN
  Administered 2022-03-09: 500 [IU]

## 2022-03-09 NOTE — Patient Instructions (Signed)
[facility]  Discharge Instructions: ?Thank you for choosing [facility] to provide your oncology and hematology care.  ?If you have a lab appointment with the [facility], please come in thru the Main Entrance and check in at the main information desk. ? ?Wear comfortable clothing and clothing appropriate for easy access to any Portacath or PICC line.  ? ?We strive to give you quality time with your provider. You may need to reschedule your appointment if you arrive late (15 or more minutes).  Arriving late affects you and other patients whose appointments are after yours.  Also, if you miss three or more appointments without notifying the office, you may be dismissed from the clinic at the provider?s discretion.    ?  ?For prescription refill requests, have your pharmacy contact our office and allow 72 hours for refills to be completed.   ? ?Today you received your pump was removed and port was flushed.  ?  ?To help prevent nausea and vomiting after your treatment, we encourage you to take your nausea medication as directed. ? ?BELOW ARE SYMPTOMS THAT SHOULD BE REPORTED IMMEDIATELY: ?*FEVER GREATER THAN 100.4 F (38 ?C) OR HIGHER ?*CHILLS OR SWEATING ?*NAUSEA AND VOMITING THAT IS NOT CONTROLLED WITH YOUR NAUSEA MEDICATION ?*UNUSUAL SHORTNESS OF BREATH ?*UNUSUAL BRUISING OR BLEEDING ?*URINARY PROBLEMS (pain or burning when urinating, or frequent urination) ?*BOWEL PROBLEMS (unusual diarrhea, constipation, pain near the anus) ?TENDERNESS IN MOUTH AND THROAT WITH OR WITHOUT PRESENCE OF ULCERS (sore throat, sores in mouth, or a toothache) ?UNUSUAL RASH, SWELLING OR PAIN  ?UNUSUAL VAGINAL DISCHARGE OR ITCHING  ? ?Items with * indicate a potential emergency and should be followed up as soon as possible or go to the Emergency Department if any problems should occur. ? ?Please show the CHEMOTHERAPY ALERT CARD or IMMUNOTHERAPY ALERT CARD at check-in to the Emergency Department and triage  nurse. ? ?Should you have questions after your visit or need to cancel or reschedule your appointment, please contact [facility] [phone]  and follow the prompts.  Office hours are 8:00 a.m. to 4:30 p.m. Monday - Friday. Please note that voicemails left after 4:00 p.m. may not be returned until the following business day.  We are closed weekends and major holidays. You have access to a nurse at all times for urgent questions. Please call the main number to the clinic [phone] and follow the prompts. ? ?For any non-urgent questions, you may also contact your provider using MyChart. We now offer e-Visits for anyone 14 and older to request care online for non-urgent symptoms. For details visit [email or web address]. ?  ?Also download the MyChart app! Go to the app store, search "MyChart", open the app, select [facility], and log in with your MyChart username and password. ? ?Due to Covid, a mask is required upon entering the hospital/clinic. If you do not have a mask, one will be given to you upon arrival. For doctor visits, patients may have 1 support person aged 108 or older with them. For treatment visits, patients cannot have anyone with them due to current Covid guidelines and our immunocompromised population.  ?

## 2022-03-09 NOTE — Progress Notes (Signed)
Patient presents today for pump d/c.  Port flushed with good blood return noted. No bruising or swelling at site. Bandaid applied and patient discharged in satisfactory condition. VVS stable with no signs or symptoms of distressed noted.  

## 2022-03-16 ENCOUNTER — Telehealth (HOSPITAL_COMMUNITY): Payer: Self-pay

## 2022-03-16 DIAGNOSIS — C180 Malignant neoplasm of cecum: Secondary | ICD-10-CM

## 2022-03-16 DIAGNOSIS — C189 Malignant neoplasm of colon, unspecified: Secondary | ICD-10-CM

## 2022-03-16 DIAGNOSIS — K5900 Constipation, unspecified: Secondary | ICD-10-CM

## 2022-03-16 MED ORDER — LACTULOSE 20 GM/30ML PO SOLN: 10.0000 g | Freq: Every day | ORAL | 1 refills | Status: DC

## 2022-03-16 NOTE — Telephone Encounter (Signed)
Triage phone call from the patient with constipation.  Spoke with the patient and reviewed the constipation teaching received with chemotherapy.  Patient has not been taking the colace as directed due to not having enough pills.  Instructed the patient a prescription for Lactulose will be sent to his pharmacy and to follow the directions and to have some more colace tabs picked up.  Reviewed the directions with the colace and lactulose with understanding verbalized.   ?

## 2022-03-20 ENCOUNTER — Encounter (HOSPITAL_COMMUNITY): Payer: Self-pay

## 2022-03-20 ENCOUNTER — Inpatient Hospital Stay (HOSPITAL_COMMUNITY): Payer: 59

## 2022-03-20 ENCOUNTER — Inpatient Hospital Stay (HOSPITAL_COMMUNITY): Payer: 59 | Attending: Hematology

## 2022-03-20 ENCOUNTER — Inpatient Hospital Stay (HOSPITAL_COMMUNITY): Payer: 59 | Admitting: Hematology

## 2022-03-20 VITALS — BP 120/79 | HR 67 | Temp 97.1°F | Resp 16

## 2022-03-20 DIAGNOSIS — D649 Anemia, unspecified: Secondary | ICD-10-CM | POA: Insufficient documentation

## 2022-03-20 DIAGNOSIS — C180 Malignant neoplasm of cecum: Secondary | ICD-10-CM | POA: Insufficient documentation

## 2022-03-20 DIAGNOSIS — C189 Malignant neoplasm of colon, unspecified: Secondary | ICD-10-CM

## 2022-03-20 DIAGNOSIS — K219 Gastro-esophageal reflux disease without esophagitis: Secondary | ICD-10-CM | POA: Insufficient documentation

## 2022-03-20 DIAGNOSIS — C3490 Malignant neoplasm of unspecified part of unspecified bronchus or lung: Secondary | ICD-10-CM

## 2022-03-20 DIAGNOSIS — C7801 Secondary malignant neoplasm of right lung: Secondary | ICD-10-CM | POA: Insufficient documentation

## 2022-03-20 DIAGNOSIS — Z5111 Encounter for antineoplastic chemotherapy: Secondary | ICD-10-CM | POA: Diagnosis present

## 2022-03-20 DIAGNOSIS — Z95828 Presence of other vascular implants and grafts: Secondary | ICD-10-CM

## 2022-03-20 DIAGNOSIS — Z79899 Other long term (current) drug therapy: Secondary | ICD-10-CM | POA: Insufficient documentation

## 2022-03-20 DIAGNOSIS — Z5112 Encounter for antineoplastic immunotherapy: Secondary | ICD-10-CM | POA: Diagnosis not present

## 2022-03-20 DIAGNOSIS — R1013 Epigastric pain: Secondary | ICD-10-CM | POA: Insufficient documentation

## 2022-03-20 DIAGNOSIS — Z87891 Personal history of nicotine dependence: Secondary | ICD-10-CM | POA: Insufficient documentation

## 2022-03-20 LAB — COMPREHENSIVE METABOLIC PANEL
ALT: 8 U/L (ref 0–44)
AST: 16 U/L (ref 15–41)
Albumin: 3.4 g/dL — ABNORMAL LOW (ref 3.5–5.0)
Alkaline Phosphatase: 45 U/L (ref 38–126)
Anion gap: 7 (ref 5–15)
BUN: 10 mg/dL (ref 8–23)
CO2: 25 mmol/L (ref 22–32)
Calcium: 8.9 mg/dL (ref 8.9–10.3)
Chloride: 105 mmol/L (ref 98–111)
Creatinine, Ser: 0.89 mg/dL (ref 0.61–1.24)
GFR, Estimated: >60 mL/min (ref >60)
Glucose, Bld: 96 mg/dL (ref 70–99)
Potassium: 3.6 mmol/L (ref 3.5–5.1)
Sodium: 137 mmol/L (ref 135–145)
Total Bilirubin: 0.4 mg/dL (ref 0.3–1.2)
Total Protein: 7.4 g/dL (ref 6.5–8.1)

## 2022-03-20 LAB — CBC WITH DIFFERENTIAL/PLATELET
Abs Immature Granulocytes: 0.02 10*3/uL (ref 0.00–0.07)
Basophils Absolute: 0.1 10*3/uL (ref 0.0–0.1)
Basophils Relative: 1 %
Eosinophils Absolute: 0.1 10*3/uL (ref 0.0–0.5)
Eosinophils Relative: 3 %
HCT: 32 % — ABNORMAL LOW (ref 39.0–52.0)
Hemoglobin: 10.4 g/dL — ABNORMAL LOW (ref 13.0–17.0)
Immature Granulocytes: 0 %
Lymphocytes Relative: 51 %
Lymphs Abs: 2.3 10*3/uL (ref 0.7–4.0)
MCH: 27.4 pg (ref 26.0–34.0)
MCHC: 32.5 g/dL (ref 30.0–36.0)
MCV: 84.2 fL (ref 80.0–100.0)
Monocytes Absolute: 0.4 10*3/uL (ref 0.1–1.0)
Monocytes Relative: 9 %
Neutro Abs: 1.6 10*3/uL — ABNORMAL LOW (ref 1.7–7.7)
Neutrophils Relative %: 36 %
Platelets: 242 10*3/uL (ref 150–400)
RBC: 3.8 MIL/uL — ABNORMAL LOW (ref 4.22–5.81)
RDW: 14.6 % (ref 11.5–15.5)
WBC: 4.5 10*3/uL (ref 4.0–10.5)
nRBC: 0 % (ref 0.0–0.2)

## 2022-03-20 LAB — URINALYSIS, DIPSTICK ONLY
Bilirubin Urine: NEGATIVE
Glucose, UA: NEGATIVE mg/dL
Ketones, ur: NEGATIVE mg/dL
Leukocytes,Ua: NEGATIVE
Nitrite: NEGATIVE
Protein, ur: NEGATIVE mg/dL
Specific Gravity, Urine: 1.012 (ref 1.005–1.030)
pH: 5 (ref 5.0–8.0)

## 2022-03-20 LAB — MAGNESIUM: Magnesium: 1.9 mg/dL (ref 1.7–2.4)

## 2022-03-20 MED ORDER — SODIUM CHLORIDE 0.9 % IV SOLN
180.0000 mg/m2 | Freq: Once | INTRAVENOUS | Status: AC
  Administered 2022-03-20: 360 mg via INTRAVENOUS
  Filled 2022-03-20: qty 15

## 2022-03-20 MED ORDER — TRAZODONE HCL 50 MG PO TABS: 50.0000 mg | ORAL_TABLET | Freq: Every day | ORAL | 0 refills | Status: DC

## 2022-03-20 MED ORDER — CYANOCOBALAMIN 1000 MCG/ML IJ SOLN
1000.0000 ug | Freq: Once | INTRAMUSCULAR | Status: AC
Start: 2022-03-20 — End: 2022-03-20
  Administered 2022-03-20: 1000 ug via INTRAMUSCULAR
  Filled 2022-03-20: qty 1

## 2022-03-20 MED ORDER — PALONOSETRON HCL INJECTION 0.25 MG/5ML
0.2500 mg | Freq: Once | INTRAVENOUS | Status: AC
  Administered 2022-03-20: 0.25 mg via INTRAVENOUS
  Filled 2022-03-20: qty 5

## 2022-03-20 MED ORDER — SODIUM CHLORIDE 0.9 % IV SOLN
5000.0000 mg | INTRAVENOUS | Status: DC
  Administered 2022-03-20: 5000 mg via INTRAVENOUS
  Filled 2022-03-20: qty 100

## 2022-03-20 MED ORDER — SODIUM CHLORIDE 0.9 % IV SOLN: Freq: Once | INTRAVENOUS | Status: AC

## 2022-03-20 MED ORDER — FLUOROURACIL CHEMO INJECTION 2.5 GM/50ML
400.0000 mg/m2 | Freq: Once | INTRAVENOUS | Status: AC
  Administered 2022-03-20: 800 mg via INTRAVENOUS
  Filled 2022-03-20: qty 16

## 2022-03-20 MED ORDER — SODIUM CHLORIDE 0.9 % IV SOLN
10.0000 mg | Freq: Once | INTRAVENOUS | Status: AC
  Administered 2022-03-20: 10 mg via INTRAVENOUS
  Filled 2022-03-20: qty 10

## 2022-03-20 MED ORDER — ATROPINE SULFATE 1 MG/ML IV SOLN
0.5000 mg | Freq: Once | INTRAVENOUS | Status: AC | PRN
  Administered 2022-03-20: 0.5 mg via INTRAVENOUS
  Filled 2022-03-20: qty 1

## 2022-03-20 MED ORDER — SODIUM CHLORIDE 0.9 % IV SOLN
400.0000 mg/m2 | Freq: Once | INTRAVENOUS | Status: AC
  Administered 2022-03-20: 792 mg via INTRAVENOUS
  Filled 2022-03-20: qty 39.6

## 2022-03-20 NOTE — Progress Notes (Signed)
? ?[facility] ?[address] ?[place], [place] [address] ? ? ?CLINIC:  ?Medical Oncology/Hematology ? ?PCP:  ?Evan Lemons, PA ?[address] [place] [address] ?[phone] ? ? 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 ?PRIOR THERAPY: none ? ?NGS Results: K-ras G12 D mutation.  HER2 negative.  TMB low.  MSI-stable.  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  Other targetable mutations negative. ? ?CURRENT THERAPY: FOLFIRI / BEVACIZUMAB Q14D ? 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  ?10/07/2019 Cancer Staging  ? Staging form: Colon and Rectum, AJCC 8th Edition ?- Clinical stage from 10/07/2019: Stage IIIB (cT3, cN1, cM0) - Signed by Derek Jack, MD on 10/07/2019 ? ?  ?02/22/2022 -  Chemotherapy  ? Patient is on Treatment Plan : COLORECTAL FOLFIRI / BEVACIZUMAB Q14D  ?   ? ? ?CANCER STAGING: ? 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 ? ?INTERVAL HISTORY:  ?Evan [name], a 66 y.o. male, returns for routine follow-up and consideration for next cycle of chemotherapy. [name] was last seen on 03/07/2022. ? ?Due for cycle #3 of FOLFIRI / BEVACIZUMAB today.  ? ?Overall, he tells me he has been feeling pretty well. His CP and hemoptysis have resolved. He has not has an episode of hemoptysis in 2 weeks. He reports he was constipated, but following starting drinking four 16 oz water bottles daily his constipation has resolved. He denies n/v/d. His energy has improved. He reports difficulty falling asleep.  ? ?Overall, he feels ready for next cycle of chemo today.  ? ? ?REVIEW OF SYSTEMS:  ?Review of Systems  ?Constitutional:  Negative  for appetite change and fatigue.  ?Respiratory:  Positive for cough and shortness of breath. Negative for hemoptysis (resolved).   ?Cardiovascular:  Negative for chest pain.  ?Gastrointestinal:  Negative for constipation (resolved), diarrhea, nausea and vomiting.  ?Psychiatric/Behavioral:  Positive for sleep disturbance.   ?All other systems reviewed and are negative. ? ?PAST MEDICAL/SURGICAL HISTORY:  ?Past Medical History:  ?Diagnosis Date  ? Arthritis   ? Colon cancer ([place])   ? colon  ? Hepatitis C   ? Hypertension   ? Port-A-Cath in place 02/15/2022  ? ?Past Surgical History:  ?Procedure Laterality Date  ? COLON SURGERY    ? PORTACATH PLACEMENT Right 02/08/2022  ? Procedure: INSERTION PORT-A-CATH- RIJ;  Surgeon: Rusty Aus, DO;  Location: [facility];  Service: General;  Laterality: Right;  ? REPLACEMENT TOTAL KNEE Left   ? SHOULDER ARTHROSCOPY Bilateral   ? TOTAL HIP ARTHROPLASTY Right 11/09/2020  ? Procedure: TOTAL HIP ARTHROPLASTY ANTERIOR APPROACH;  Surgeon: Renette Butters, MD;  Location: [facility];  Service: Orthopedics;  Laterality: Right;  ? WRIST SURGERY Left   ? ? ?SOCIAL HISTORY:  ?Social History  ? ?Socioeconomic History  ? Marital status: Married  ?  Spouse name: Not on file  ? Number of children: 7  ? Years of education: Not on file  ? Highest education level: Not on file  ?Occupational History  ? Occupation: employed  ?Tobacco Use  ? Smoking status: Former  ?  Packs/day: 1.50  ?  Years:  20.00  ?  Pack years: 30.00  ?  Types: Cigarettes  ?  Quit date: 11/19/2005  ?  Years since quitting: 16.3  ? Smokeless tobacco: Never  ?Vaping Use  ? Vaping Use: Never used  ?Substance and Sexual Activity  ? Alcohol use: Not Currently  ? Drug use: Never  ? Sexual activity: Yes  ?Other Topics Concern  ? Not on file  ?Social History Narrative  ? ** Merged History Encounter **  ?    ? Separated from wife 11/2021  ? ?Social Determinants of Health  ? ?Financial Resource Strain: Not on file  ?Food Insecurity: Not on  file  ?Transportation Needs: Not on file  ?Physical Activity: Not on file  ?Stress: Not on file  ?Social Connections: Not on file  ?Intimate Partner Violence: Not on file  ? ? ?FAMILY HISTORY:  ?Family History  ?Problem Relation Age of Onset  ? Heart disease Mother   ? Dementia Father   ? Heart disease Sister   ? Cancer Brother   ? Hypertension Brother   ? Hypertension Brother   ? Healthy Son   ? Healthy Son   ? Healthy Son   ? Healthy Son   ? Healthy Daughter   ? Healthy Daughter   ? Healthy Daughter   ? ? ?CURRENT MEDICATIONS:  ?Current Outpatient Medications  ?Medication Sig Dispense Refill  ? aluminum-magnesium hydroxide 200-200 MG/5ML suspension Take 10 mLs by mouth every 6 (six) hours as needed for indigestion. 355 mL 0  ? Bevacizumab (AVASTIN IV) Inject into the vein every 14 (fourteen) days. *start date TBD    ? famotidine (PEPCID) 20 MG tablet Take 1 tablet (20 mg total) by mouth daily. 30 tablet 3  ? fluorouracil CALGB 61607 2,400 mg/m2 in sodium chloride 0.9 % 150 mL Inject 2,400 mg/m2 into the vein over 48 hr.    ? FLUOROURACIL IV Inject into the vein every 14 (fourteen) days.    ? IRINOTECAN HCL IV Inject into the vein every 14 (fourteen) days.    ? Lactulose 20 GM/30ML SOLN Take 15 mLs (10 g total) by mouth at bedtime. Take 15 ml at bedtime every night to assist with regular bowel movements.  Titrate down if having multiple bowel movements.  If a bowel movement has not occurred in 3 to 4 days or longer, then take 15 ml every 3 hours until a bowel movent has occurred. 450 mL 1  ? LEUCOVORIN CALCIUM IV Inject into the vein every 14 (fourteen) days.    ? lidocaine-prilocaine (EMLA) cream Apply a small amount to port a cath site (do not rub in) and cover with plastic wrap 1 hour prior to infusion appointments 30 g 3  ? naproxen sodium (ALEVE) 220 MG tablet Take 220-440 mg by mouth daily as needed (pain).    ? oxyCODONE (ROXICODONE) 5 MG immediate release tablet Take 1 tablet (5 mg total) by mouth every 8  (eight) hours as needed. 10 tablet 0  ? prochlorperazine (COMPAZINE) 10 MG tablet Take 1 tablet (10 mg total) by mouth every 6 (six) hours as needed (NAUSEA). 30 tablet 1  ? vitamin B-12 (CYANOCOBALAMIN) 1000 MCG tablet Take 1 tablet (1,000 mcg total) by mouth daily. 30 tablet 6  ? ?No current facility-administered medications for this visit.  ? ? ?ALLERGIES:  ?No Known Allergies ? ?PHYSICAL EXAM:  ?Performance status (ECOG): 0 - Asymptomatic ? ?There were no vitals filed for this visit. ?Wt Readings from Last 3 Encounters:  ?03/20/22  172 lb (78 kg)  ?03/07/22 174 lb 9.6 oz (79.2 kg)  ?02/22/22 169 lb 14.4 oz (77.1 kg)  ? ?Physical Exam ?Vitals reviewed.  ?Constitutional:   ?   Appearance: Normal appearance.  ?Cardiovascular:  ?   Rate and Rhythm: Normal rate and regular rhythm.  ?   Pulses: Normal pulses.  ?   Heart sounds: Normal heart sounds.  ?Pulmonary:  ?   Effort: Pulmonary effort is normal.  ?   Breath sounds: Normal breath sounds.  ?Abdominal:  ?   Palpations: Abdomen is soft. There is no mass.  ?   Tenderness: There is no abdominal tenderness.  ?Neurological:  ?   General: No focal deficit present.  ?   Mental Status: He is alert and oriented to person, place, and time.  ?Psychiatric:     ?   Mood and Affect: Mood normal.     ?   Behavior: Behavior normal.  ? ? ?LABORATORY DATA:  ?I have reviewed the labs as listed.  ? ?  Latest Ref Rng & Units 03/20/2022  ?  8:15 AM 03/07/2022  ?  9:15 AM 02/22/2022  ?  8:22 AM  ?CBC  ?WBC 4.0 - 10.5 K/uL 4.5   5.3   7.1    ?Hemoglobin 13.0 - 17.0 g/dL 10.4   10.2   11.1    ?Hematocrit 39.0 - 52.0 % 32.0   31.4   34.4    ?Platelets 150 - 400 K/uL 242   238   282    ? ? ?  Latest Ref Rng & Units 03/20/2022  ?  8:15 AM 03/07/2022  ?  9:15 AM 02/22/2022  ?  8:22 AM  ?CMP  ?Glucose 70 - 99 mg/dL 96   98   145    ?BUN 8 - 23 mg/dL _0 ?Creatinine 0.61 - 1.24 mg/dL 0.89   1.04   0.89    ?Sodium 135 - 145 mmol/L 137   136   135    ?Potassium 3.5 - 5.1 mmol/L 3.6   3.4    3.4    ?Chloride 98 - 111 mmol/L 105   105   100    ?CO2 22 - 32 mmol/L _1 ?Calcium 8.9 - 10.3 mg/dL 8.9   8.6   8.7    ?Total Protein 6.5 - 8.1 g/dL 7.4   7.2   7.4    ?Total Bilirubin 0.3

## 2022-03-20 NOTE — Progress Notes (Signed)
Patient presents today for treatment and follow up visit with Dr. Vickey Huger. Labs pending. Vital signs within parameters for today's treatment. MAR reviewed and updated. Patient denies any cough and coughing up blood . Patient has no complaints today. Baseline UA dipstick sent to lab for possivle Zirabev today. Attestation 02/15/2022. Consent 02/22/2022 ? ?Labs within parameters for treatment. Per verbal message from A. Ouida Sills RN / Dr. Delton Coombes to proceed with treatment. Avastin held today. Per A. Anderson Dr. Delton Coombes will hold Avastin until 4 weeks of no coughing up blood.  ? ?Treatment given today per MD orders. Tolerated infusion without adverse affects. Vital signs stable. No complaints at this time. 5FU infusing and RUN noted on screen and verified with patient. Discharged from clinic ambulatory in stable condition. Alert and oriented x 3. F/U with [facility] as scheduled.   ?

## 2022-03-20 NOTE — Patient Instructions (Signed)
[facility]  Discharge Instructions: ?Thank you for choosing [facility] to provide your oncology and hematology care.  ?If you have a lab appointment with the [facility], please come in thru the Main Entrance and check in at the main information desk. ? ?Wear comfortable clothing and clothing appropriate for easy access to any Portacath or PICC line.  ? ?We strive to give you quality time with your provider. You may need to reschedule your appointment if you arrive late (15 or more minutes).  Arriving late affects you and other patients whose appointments are after yours.  Also, if you miss three or more appointments without notifying the office, you may be dismissed from the clinic at the provider?s discretion.    ?  ?For prescription refill requests, have your pharmacy contact our office and allow 72 hours for refills to be completed.   ? ?Today you received the following chemotherapy and/or immunotherapy agents Folfiri/5FU pump.     ?  ?To help prevent nausea and vomiting after your treatment, we encourage you to take your nausea medication as directed. ? ?BELOW ARE SYMPTOMS THAT SHOULD BE REPORTED IMMEDIATELY: ?*FEVER GREATER THAN 100.4 F (38 ?C) OR HIGHER ?*CHILLS OR SWEATING ?*NAUSEA AND VOMITING THAT IS NOT CONTROLLED WITH YOUR NAUSEA MEDICATION ?*UNUSUAL SHORTNESS OF BREATH ?*UNUSUAL BRUISING OR BLEEDING ?*URINARY PROBLEMS (pain or burning when urinating, or frequent urination) ?*BOWEL PROBLEMS (unusual diarrhea, constipation, pain near the anus) ?TENDERNESS IN MOUTH AND THROAT WITH OR WITHOUT PRESENCE OF ULCERS (sore throat, sores in mouth, or a toothache) ?UNUSUAL RASH, SWELLING OR PAIN  ?UNUSUAL VAGINAL DISCHARGE OR ITCHING  ? ?Items with * indicate a potential emergency and should be followed up as soon as possible or go to the Emergency Department if any problems should occur. ? ?Please show the CHEMOTHERAPY ALERT CARD or IMMUNOTHERAPY ALERT CARD at check-in to the  Emergency Department and triage nurse. ? ?Should you have questions after your visit or need to cancel or reschedule your appointment, please contact [facility] [phone]  and follow the prompts.  Office hours are 8:00 a.m. to 4:30 p.m. Monday - Friday. Please note that voicemails left after 4:00 p.m. may not be returned until the following business day.  We are closed weekends and major holidays. You have access to a nurse at all times for urgent questions. Please call the main number to the clinic [phone] and follow the prompts. ? ?For any non-urgent questions, you may also contact your provider using MyChart. We now offer e-Visits for anyone 23 and older to request care online for non-urgent symptoms. For details visit [email or web address]. ?  ?Also download the MyChart app! Go to the app store, search "MyChart", open the app, select [facility], and log in with your MyChart username and password. ? ?Due to Covid, a mask is required upon entering the hospital/clinic. If you do not have a mask, one will be given to you upon arrival. For doctor visits, patients may have 1 support person aged 83 or older with them. For treatment visits, patients cannot have anyone with them due to current Covid guidelines and our immunocompromised population.  ?

## 2022-03-20 NOTE — Patient Instructions (Addendum)
[facility] at [facility] ?Discharge Instructions ? ? ?You were seen and examined today by Dr. Delton Coombes. ? ?He reviewed your lab results which are normal/stable except for your B12. It is low.  We will give you a B12 injection today. Start taking B12 1mg  tablet daily.  ? ?A prescription for trazodone was sent to your pharmacy.  You can take 1 pill at bedtime to help with insomnia.  ? ?We will proceed with your treatment today. ? ?Return as scheduled in 2 weeks.  ? ? ? ? ?Thank you for choosing [facility] at [facility] to provide your oncology and hematology care.  To afford each patient quality time with our provider, please arrive at least 15 minutes before your scheduled appointment time.  ? ?If you have a lab appointment with the [facility] please come in thru the Main Entrance and check in at the main information desk. ? ?You need to re-schedule your appointment should you arrive 10 or more minutes late.  We strive to give you quality time with our providers, and arriving late affects you and other patients whose appointments are after yours.  Also, if you no show three or more times for appointments you may be dismissed from the clinic at the providers discretion.     ?Again, thank you for choosing [facility].  Our hope is that these requests will decrease the amount of time that you wait before being seen by our physicians.       ?_____________________________________________________________ ? ?Should you have questions after your visit to [facility], please contact our office at [phone] and follow the prompts.  Our office hours are 8:00 a.m. and 4:30 p.m. Monday - Friday.  Please note that voicemails left after 4:00 p.m. may not be returned until the following business day.  We are closed weekends and major holidays.  You do have access to a nurse 24-7, just call the main number to the clinic [phone] and do not press  any options, hold on the line and a nurse will answer the phone.   ? ?For prescription refill requests, have your pharmacy contact our office and allow 72 hours.   ? ?Due to Covid, you will need to wear a mask upon entering the hospital. If you do not have a mask, a mask will be given to you at the Main Entrance upon arrival. For doctor visits, patients may have 1 support person age 16 or older with them. For treatment visits, patients can not have anyone with them due to social distancing guidelines and our immunocompromised population.  ? ?   ?

## 2022-03-20 NOTE — Progress Notes (Signed)
Patient has been examined by Dr. Katragadda, and vital signs and labs have been reviewed. ANC, Creatinine, LFTs, hemoglobin, and platelets are within treatment parameters per M.D. - pt may proceed with treatment.    °

## 2022-03-21 LAB — CEA: CEA: 25.8 ng/mL — ABNORMAL HIGH (ref 0.0–4.7)

## 2022-03-22 ENCOUNTER — Inpatient Hospital Stay (HOSPITAL_COMMUNITY): Payer: 59

## 2022-03-22 VITALS — BP 89/65 | HR 73 | Temp 96.8°F | Resp 20

## 2022-03-22 DIAGNOSIS — Z5112 Encounter for antineoplastic immunotherapy: Secondary | ICD-10-CM | POA: Diagnosis not present

## 2022-03-22 DIAGNOSIS — I959 Hypotension, unspecified: Secondary | ICD-10-CM

## 2022-03-22 DIAGNOSIS — Z95828 Presence of other vascular implants and grafts: Secondary | ICD-10-CM

## 2022-03-22 MED ORDER — SODIUM CHLORIDE 0.9 % IV SOLN: Freq: Once | INTRAVENOUS | Status: AC

## 2022-03-22 MED ORDER — SODIUM CHLORIDE 0.9% FLUSH
10.0000 mL | INTRAVENOUS | Status: DC | PRN
  Administered 2022-03-22: 10 mL via INTRAVENOUS

## 2022-03-22 MED ORDER — HEPARIN SOD (PORK) LOCK FLUSH 100 UNIT/ML IV SOLN
500.0000 [IU] | Freq: Once | INTRAVENOUS | Status: AC
  Administered 2022-03-22: 500 [IU] via INTRAVENOUS

## 2022-03-22 NOTE — Patient Instructions (Signed)
[facility]  Discharge Instructions: Thank you for choosing [facility] to provide your oncology and hematology care.  If you have a lab appointment with the Cancer Center, please come in thru the Main Entrance and check in at the main information desk.  Wear comfortable clothing and clothing appropriate for easy access to any Portacath or PICC line.   We strive to give you quality time with your provider. You may need to reschedule your appointment if you arrive late (15 or more minutes).  Arriving late affects you and other patients whose appointments are after yours.  Also, if you miss three or more appointments without notifying the office, you may be dismissed from the clinic at the provider's discretion.      For prescription refill requests, have your pharmacy contact our office and allow 72 hours for refills to be completed.        To help prevent nausea and vomiting after your treatment, we encourage you to take your nausea medication as directed.  BELOW ARE SYMPTOMS THAT SHOULD BE REPORTED IMMEDIATELY: *FEVER GREATER THAN 100.4 F (38 C) OR HIGHER *CHILLS OR SWEATING *NAUSEA AND VOMITING THAT IS NOT CONTROLLED WITH YOUR NAUSEA MEDICATION *UNUSUAL SHORTNESS OF BREATH *UNUSUAL BRUISING OR BLEEDING *URINARY PROBLEMS (pain or burning when urinating, or frequent urination) *BOWEL PROBLEMS (unusual diarrhea, constipation, pain near the anus) TENDERNESS IN MOUTH AND THROAT WITH OR WITHOUT PRESENCE OF ULCERS (sore throat, sores in mouth, or a toothache) UNUSUAL RASH, SWELLING OR PAIN  UNUSUAL VAGINAL DISCHARGE OR ITCHING   Items with * indicate a potential emergency and should be followed up as soon as possible or go to the Emergency Department if any problems should occur.  Please show the CHEMOTHERAPY ALERT CARD or IMMUNOTHERAPY ALERT CARD at check-in to the Emergency Department and triage nurse.  Should you have questions after your visit or need to cancel  or reschedule your appointment, please contact [facility] [phone]  and follow the prompts.  Office hours are 8:00 a.m. to 4:30 p.m. Monday - Friday. Please note that voicemails left after 4:00 p.m. may not be returned until the following business day.  We are closed weekends and major holidays. You have access to a nurse at all times for urgent questions. Please call the main number to the clinic [phone] and follow the prompts.  For any non-urgent questions, you may also contact your provider using MyChart. We now offer e-Visits for anyone 18 and older to request care online for non-urgent symptoms. For details visit [email or web address].   Also download the MyChart app! Go to the app store, search "MyChart", open the app, select [facility], and log in with your MyChart username and password.  Due to Covid, a mask is required upon entering the hospital/clinic. If you do not have a mask, one will be given to you upon arrival. For doctor visits, patients may have 1 support person aged 18 or older with them. For treatment visits, patients cannot have anyone with them due to current Covid guidelines and our immunocompromised population.  

## 2022-03-22 NOTE — Progress Notes (Signed)
Patient presents today for 5FU home infusion pump d/c.  Patient is in satisfactory condition with only complaints of lightheadedness.  BP today is 81/57.  All other vital signs are stable.  Dr. Delton Coombes notified of BP.  He ordered NS 1 L over one hour.  We will proceed per MD orders.  ? ?5FU home infusion pump disconnected with no difficulties.  Port was flushed and good blood return noted.  NS infusing.   ? ?Patient tolerated fluids well with no complaints voiced.  Patient left ambulatory in stable condition.  Vital signs stable at discharge.  Follow up as scheduled.    ?

## 2022-04-04 ENCOUNTER — Inpatient Hospital Stay (HOSPITAL_BASED_OUTPATIENT_CLINIC_OR_DEPARTMENT_OTHER): Payer: 59 | Admitting: Hematology

## 2022-04-04 ENCOUNTER — Inpatient Hospital Stay (HOSPITAL_COMMUNITY): Payer: 59

## 2022-04-04 VITALS — BP 118/81 | HR 52 | Temp 97.6°F | Resp 16

## 2022-04-04 DIAGNOSIS — C3490 Malignant neoplasm of unspecified part of unspecified bronchus or lung: Secondary | ICD-10-CM | POA: Diagnosis not present

## 2022-04-04 DIAGNOSIS — Z5112 Encounter for antineoplastic immunotherapy: Secondary | ICD-10-CM | POA: Diagnosis not present

## 2022-04-04 DIAGNOSIS — C189 Malignant neoplasm of colon, unspecified: Secondary | ICD-10-CM

## 2022-04-04 DIAGNOSIS — C180 Malignant neoplasm of cecum: Secondary | ICD-10-CM

## 2022-04-04 DIAGNOSIS — Z95828 Presence of other vascular implants and grafts: Secondary | ICD-10-CM

## 2022-04-04 LAB — CBC WITH DIFFERENTIAL/PLATELET
Abs Immature Granulocytes: 0.02 10*3/uL (ref 0.00–0.07)
Basophils Absolute: 0 10*3/uL (ref 0.0–0.1)
Basophils Relative: 1 %
Eosinophils Absolute: 0.1 10*3/uL (ref 0.0–0.5)
Eosinophils Relative: 2 %
HCT: 30.5 % — ABNORMAL LOW (ref 39.0–52.0)
Hemoglobin: 9.9 g/dL — ABNORMAL LOW (ref 13.0–17.0)
Immature Granulocytes: 1 %
Lymphocytes Relative: 43 %
Lymphs Abs: 1.8 10*3/uL (ref 0.7–4.0)
MCH: 27.9 pg (ref 26.0–34.0)
MCHC: 32.5 g/dL (ref 30.0–36.0)
MCV: 85.9 fL (ref 80.0–100.0)
Monocytes Absolute: 0.5 10*3/uL (ref 0.1–1.0)
Monocytes Relative: 12 %
Neutro Abs: 1.7 10*3/uL (ref 1.7–7.7)
Neutrophils Relative %: 41 %
Platelets: 193 10*3/uL (ref 150–400)
RBC: 3.55 MIL/uL — ABNORMAL LOW (ref 4.22–5.81)
RDW: 16.4 % — ABNORMAL HIGH (ref 11.5–15.5)
WBC: 4.1 10*3/uL (ref 4.0–10.5)
nRBC: 0 % (ref 0.0–0.2)

## 2022-04-04 LAB — MAGNESIUM: Magnesium: 1.9 mg/dL (ref 1.7–2.4)

## 2022-04-04 LAB — COMPREHENSIVE METABOLIC PANEL
ALT: 6 U/L (ref 0–44)
AST: 16 U/L (ref 15–41)
Albumin: 3.5 g/dL (ref 3.5–5.0)
Alkaline Phosphatase: 47 U/L (ref 38–126)
Anion gap: 6 (ref 5–15)
BUN: 11 mg/dL (ref 8–23)
CO2: 25 mmol/L (ref 22–32)
Calcium: 8.9 mg/dL (ref 8.9–10.3)
Chloride: 106 mmol/L (ref 98–111)
Creatinine, Ser: 0.93 mg/dL (ref 0.61–1.24)
GFR, Estimated: >60 mL/min (ref >60)
Glucose, Bld: 110 mg/dL — ABNORMAL HIGH (ref 70–99)
Potassium: 3.7 mmol/L (ref 3.5–5.1)
Sodium: 137 mmol/L (ref 135–145)
Total Bilirubin: 0.6 mg/dL (ref 0.3–1.2)
Total Protein: 7.5 g/dL (ref 6.5–8.1)

## 2022-04-04 MED ORDER — ATROPINE SULFATE 1 MG/ML IV SOLN
0.5000 mg | Freq: Once | INTRAVENOUS | Status: AC | PRN
  Administered 2022-04-04: 0.5 mg via INTRAVENOUS
  Filled 2022-04-04: qty 1

## 2022-04-04 MED ORDER — SODIUM CHLORIDE 0.9 % IV SOLN
400.0000 mg/m2 | Freq: Once | INTRAVENOUS | Status: AC
  Administered 2022-04-04: 792 mg via INTRAVENOUS
  Filled 2022-04-04: qty 39.6

## 2022-04-04 MED ORDER — PALONOSETRON HCL INJECTION 0.25 MG/5ML
0.2500 mg | Freq: Once | INTRAVENOUS | Status: AC
  Administered 2022-04-04: 0.25 mg via INTRAVENOUS
  Filled 2022-04-04: qty 5

## 2022-04-04 MED ORDER — SODIUM CHLORIDE 0.9 % IV SOLN
5000.0000 mg | INTRAVENOUS | Status: DC
  Administered 2022-04-04: 5000 mg via INTRAVENOUS
  Filled 2022-04-04: qty 100

## 2022-04-04 MED ORDER — SODIUM CHLORIDE 0.9 % IV SOLN
10.0000 mg | Freq: Once | INTRAVENOUS | Status: AC
  Administered 2022-04-04: 10 mg via INTRAVENOUS
  Filled 2022-04-04: qty 10

## 2022-04-04 MED ORDER — SODIUM CHLORIDE 0.9 % IV SOLN
5.0000 mg/kg | Freq: Once | INTRAVENOUS | Status: AC
  Administered 2022-04-04: 400 mg via INTRAVENOUS
  Filled 2022-04-04: qty 16

## 2022-04-04 MED ORDER — SODIUM CHLORIDE 0.9 % IV SOLN
180.0000 mg/m2 | Freq: Once | INTRAVENOUS | Status: AC
  Administered 2022-04-04: 360 mg via INTRAVENOUS
  Filled 2022-04-04: qty 15

## 2022-04-04 MED ORDER — SODIUM CHLORIDE 0.9 % IV SOLN: Freq: Once | INTRAVENOUS | Status: AC

## 2022-04-04 MED ORDER — FLUOROURACIL CHEMO INJECTION 2.5 GM/50ML
400.0000 mg/m2 | Freq: Once | INTRAVENOUS | Status: AC
  Administered 2022-04-04: 800 mg via INTRAVENOUS
  Filled 2022-04-04: qty 16

## 2022-04-04 NOTE — Progress Notes (Signed)
Patient has been examined by Dr. Delton Coombes, and vital signs and labs have been reviewed. ANC, Creatinine, LFTs, hemoglobin, and platelets are within treatment parameters per M.D. - pt may proceed with treatment.  Will add Avastin to tx today per MD.  ?

## 2022-04-04 NOTE — Progress Notes (Signed)
? ?[facility] ?[address] ?[place],  [address] ? ? ?CLINIC:  ?Medical Oncology/Hematology ? ?PCP:  ?Lavella Lemons, PA ?[address] [place] [address] ?[phone] ? ? 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 ?PRIOR THERAPY: none ? ?NGS Results: K-ras G12 D mutation.  HER2 negative.  TMB low.  MSI-stable.  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  Other targetable mutations negative. ? ?CURRENT THERAPY: FOLFIRI / BEVACIZUMAB Q14D ? 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 ?  ?10/07/2019 Cancer Staging  ? Staging form: Colon and Rectum, AJCC 8th Edition ?- Clinical stage from 10/07/2019: Stage IIIB (cT3, cN1, cM0) - Signed by Derek Jack, MD on 10/07/2019 ? ?  ?02/22/2022 -  Chemotherapy  ? Patient is on Treatment Plan : COLORECTAL FOLFIRI / BEVACIZUMAB Q14D  ? ?  ?  ? ? ?CANCER STAGING: ? 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 ? ?INTERVAL HISTORY:  ?[name], a 66 y.o. male, returns for routine follow-up and consideration for next cycle of chemotherapy. Sisto was last seen on 03/20/2022. ? ?Due for cycle #4 of FOLFIRI / BEVACIZUMAB today.  ? ?Overall, he tells me he has been feeling pretty well. He denies any current pain. He reports occasional cough. He denies hemoptysis and CP. He denies acid reflux or heart burn. He continues to take vitamin B-12. He is taking trazodone every night which has improved his sleep. He denies abdominal cramping. His energy is good. He denies ankle swellings, nausea, vomiting, and mouth sores.  ? ?Overall, he feels ready for next cycle of chemo today.  ? ? ?REVIEW OF SYSTEMS:   ?Review of Systems  ?Constitutional:  Negative for appetite change and fatigue.  ?HENT:   Negative for mouth sores.   ?Respiratory:  Positive for shortness of breath. Negative for hemoptysis.   ?Cardiovascular:  Negative for chest pain and leg swelling.  ?Gastrointestinal:  Negative for abdominal pain, nausea and vomiting.  ?Psychiatric/Behavioral:  Negative for sleep disturbance.   ?All other systems reviewed and are negative. ? ?PAST MEDICAL/SURGICAL HISTORY:  ?Past Medical History:  ?Diagnosis Date  ? Arthritis   ? Colon cancer ([place])   ? colon  ? Hepatitis C   ? Hypertension   ? Port-A-Cath in place 02/15/2022  ? ?Past Surgical History:  ?Procedure Laterality Date  ? COLON SURGERY    ? PORTACATH PLACEMENT Right 02/08/2022  ? Procedure: INSERTION PORT-A-CATH- RIJ;  Surgeon: Rusty Aus, DO;  Location: [facility];  Service: General;  Laterality: Right;  ? REPLACEMENT TOTAL KNEE Left   ? SHOULDER ARTHROSCOPY Bilateral   ? TOTAL HIP ARTHROPLASTY Right 11/09/2020  ? Procedure: TOTAL HIP ARTHROPLASTY ANTERIOR APPROACH;  Surgeon: Renette Butters, MD;  Location: [facility];  Service: Orthopedics;  Laterality: Right;  ? WRIST SURGERY Left   ? ? ?SOCIAL HISTORY:  ?Social History  ? ?Socioeconomic History  ? Marital status: Married  ?  Spouse name: Not on file  ? Number of children: 7  ? Years of education: Not on file  ? Highest education level: Not on file  ?Occupational History  ? Occupation:  employed  ?Tobacco Use  ? Smoking status: Former  ?  Packs/day: 1.50  ?  Years: 20.00  ?  Pack years: 30.00  ?  Types: Cigarettes  ?  Quit date: 11/19/2005  ?  Years since quitting: 16.3  ? Smokeless tobacco: Never  ?Vaping Use  ? Vaping Use: Never used  ?Substance and Sexual Activity  ? Alcohol use: Not Currently  ? Drug use: Never  ? Sexual activity: Yes  ?Other Topics Concern  ? Not on file  ?Social History Narrative  ? ** Merged History Encounter **  ?    ? Separated from wife 11/2021  ? ?Social Determinants of Health   ? ?Financial Resource Strain: Not on file  ?Food Insecurity: Not on file  ?Transportation Needs: Not on file  ?Physical Activity: Not on file  ?Stress: Not on file  ?Social Connections: Not on file  ?Intimate Partner Violence: Not on file  ? ? ?FAMILY HISTORY:  ?Family History  ?Problem Relation Age of Onset  ? Heart disease Mother   ? Dementia Father   ? Heart disease Sister   ? Cancer Brother   ? Hypertension Brother   ? Hypertension Brother   ? Healthy Son   ? Healthy Son   ? Healthy Son   ? Healthy Son   ? Healthy Daughter   ? Healthy Daughter   ? Healthy Daughter   ? ? ?CURRENT MEDICATIONS:  ?Current Outpatient Medications  ?Medication Sig Dispense Refill  ? aluminum-magnesium hydroxide 200-200 MG/5ML suspension Take 10 mLs by mouth every 6 (six) hours as needed for indigestion. 355 mL 0  ? Bevacizumab (AVASTIN IV) Inject into the vein every 14 (fourteen) days. *start date TBD    ? famotidine (PEPCID) 20 MG tablet Take 1 tablet (20 mg total) by mouth daily. 30 tablet 3  ? fluorouracil CALGB 51761 2,400 mg/m2 in sodium chloride 0.9 % 150 mL Inject 2,400 mg/m2 into the vein over 48 hr.    ? FLUOROURACIL IV Inject into the vein every 14 (fourteen) days.    ? IRINOTECAN HCL IV Inject into the vein every 14 (fourteen) days.    ? Lactulose 20 GM/30ML SOLN Take 15 mLs (10 g total) by mouth at bedtime. Take 15 ml at bedtime every night to assist with regular bowel movements.  Titrate down if having multiple bowel movements.  If a bowel movement has not occurred in 3 to 4 days or longer, then take 15 ml every 3 hours until a bowel movent has occurred. 450 mL 1  ? LEUCOVORIN CALCIUM IV Inject into the vein every 14 (fourteen) days.    ? naproxen sodium (ALEVE) 220 MG tablet Take 220-440 mg by mouth daily as needed (pain).    ? oxyCODONE (ROXICODONE) 5 MG immediate release tablet Take 1 tablet (5 mg total) by mouth every 8 (eight) hours as needed. 10 tablet 0  ? traZODone (DESYREL) 50 MG tablet Take 1 tablet (50 mg  total) by mouth at bedtime. 30 tablet 0  ? vitamin B-12 (CYANOCOBALAMIN) 1000 MCG tablet Take 1 tablet (1,000 mcg total) by mouth daily. 30 tablet 6  ? lidocaine-prilocaine (EMLA) cream Apply a small amount to port a cath site (do not rub in) and cover with plastic wrap 1 hour prior to infusion appointments (Patient not taking: Reported on 04/04/2022) 30 g 3  ? prochlorperazine (COMPAZINE) 10 MG tablet Take 1 tablet (10 mg total) by mouth every 6 (six) hours as needed (NAUSEA). (Patient not taking:  Reported on 04/04/2022) 30 tablet 1  ? ?No current facility-administered medications for this visit.  ? ? ?ALLERGIES:  ?No Known Allergies ? ?PHYSICAL EXAM:  ?Performance status (ECOG): 0 - Asymptomatic ? ?There were no vitals filed for this visit. ?Wt Readings from Last 3 Encounters:  ?04/04/22 173 lb 9.6 oz (78.7 kg)  ?03/20/22 172 lb (78 kg)  ?03/07/22 174 lb 9.6 oz (79.2 kg)  ? ?Physical Exam ?Vitals reviewed.  ?Constitutional:   ?   Appearance: Normal appearance.  ?Cardiovascular:  ?   Rate and Rhythm: Normal rate and regular rhythm.  ?   Pulses: Normal pulses.  ?   Heart sounds: Normal heart sounds.  ?Pulmonary:  ?   Effort: Pulmonary effort is normal.  ?   Breath sounds: Normal breath sounds.  ?Neurological:  ?   General: No focal deficit present.  ?   Mental Status: He is alert and oriented to person, place, and time.  ?Psychiatric:     ?   Mood and Affect: Mood normal.     ?   Behavior: Behavior normal.  ? ? ?LABORATORY DATA:  ?I have reviewed the labs as listed.  ? ?  Latest Ref Rng & Units 04/04/2022  ?  8:14 AM 03/20/2022  ?  8:15 AM 03/07/2022  ?  9:15 AM  ?CBC  ?WBC 4.0 - 10.5 K/uL 4.1   4.5   5.3    ?Hemoglobin 13.0 - 17.0 g/dL 9.9   10.4   10.2    ?Hematocrit 39.0 - 52.0 % 30.5   32.0   31.4    ?Platelets 150 - 400 K/uL 193   242   238    ? ? ?  Latest Ref Rng & Units 04/04/2022  ?  8:14 AM 03/20/2022  ?  8:15 AM 03/07/2022  ?  9:15 AM  ?CMP  ?Glucose 70 - 99 mg/dL 110   96   98    ?BUN 8 - 23 mg/dL '11   10   12     ' ?Creatinine 0.61 - 1.24 mg/dL 0.93   0.89   1.04    ?Sodium 135 - 145 mmol/L 137   137   136    ?Potassium 3.5 - 5.1 mmol/L 3.7   3.6   3.4    ?Chloride 98 - 111 mmol/L 106   105   105    ?CO2 22 - 32 mmol/L 25

## 2022-04-04 NOTE — Patient Instructions (Signed)
[facility] at [facility] ?Discharge Instructions ? ? ?You were seen and examined today by Dr. Delton Coombes. ? ?He reviewed your lab work today which is normal/stable.  ? ?We will proceed with your treatment today. ? ?Return as scheduled in 2 weeks.  ? ? ?Thank you for choosing [place] at [facility] to provide your oncology and hematology care.  To afford each patient quality time with our provider, please arrive at least 15 minutes before your scheduled appointment time.  ? ?If you have a lab appointment with the [facility] please come in thru the Main Entrance and check in at the main information desk. ? ?You need to re-schedule your appointment should you arrive 10 or more minutes late.  We strive to give you quality time with our providers, and arriving late affects you and other patients whose appointments are after yours.  Also, if you no show three or more times for appointments you may be dismissed from the clinic at the providers discretion.     ?Again, thank you for choosing [facility].  Our hope is that these requests will decrease the amount of time that you wait before being seen by our physicians.       ?_____________________________________________________________ ? ?Should you have questions after your visit to [facility], please contact our office at [phone] and follow the prompts.  Our office hours are 8:00 a.m. and 4:30 p.m. Monday - Friday.  Please note that voicemails left after 4:00 p.m. may not be returned until the following business day.  We are closed weekends and major holidays.  You do have access to a nurse 24-7, just call the main number to the clinic [phone] and do not press any options, hold on the line and a nurse will answer the phone.   ? ?For prescription refill requests, have your pharmacy contact our office and allow 72 hours.   ? ?Due to Covid, you will need to wear a mask upon entering  the hospital. If you do not have a mask, a mask will be given to you at the Main Entrance upon arrival. For doctor visits, patients may have 1 support person age 27 or older with them. For treatment visits, patients can not have anyone with them due to social distancing guidelines and our immunocompromised population.  ? ?   ?

## 2022-04-04 NOTE — Addendum Note (Signed)
Addended by: Donnie Aho on: 04/04/2022 03:26 PM ? ? Modules accepted: Orders ? ?

## 2022-04-04 NOTE — Progress Notes (Signed)
Patients port flushed without difficulty.  Good blood return noted with no bruising or swelling noted at site.  Stable during access and blood draw.  Patient to remain accessed for treatment. 

## 2022-04-06 ENCOUNTER — Inpatient Hospital Stay (HOSPITAL_COMMUNITY): Payer: 59

## 2022-04-06 VITALS — BP 137/94 | HR 60 | Temp 97.7°F | Resp 18

## 2022-04-06 DIAGNOSIS — C180 Malignant neoplasm of cecum: Secondary | ICD-10-CM

## 2022-04-06 DIAGNOSIS — Z95828 Presence of other vascular implants and grafts: Secondary | ICD-10-CM

## 2022-04-06 DIAGNOSIS — Z5112 Encounter for antineoplastic immunotherapy: Secondary | ICD-10-CM | POA: Diagnosis not present

## 2022-04-06 MED ORDER — HEPARIN SOD (PORK) LOCK FLUSH 100 UNIT/ML IV SOLN
500.0000 [IU] | Freq: Once | INTRAVENOUS | Status: AC | PRN
  Administered 2022-04-06: 500 [IU]

## 2022-04-06 MED ORDER — SODIUM CHLORIDE 0.9% FLUSH
10.0000 mL | INTRAVENOUS | Status: DC | PRN
  Administered 2022-04-06: 10 mL

## 2022-04-06 NOTE — Patient Instructions (Signed)
[facility]  Discharge Instructions: ?Thank you for choosing [facility] to provide your oncology and hematology care.  ?If you have a lab appointment with the [facility], please come in thru the Main Entrance and check in at the main information desk. ? ?Wear comfortable clothing and clothing appropriate for easy access to any Portacath or PICC line.  ? ?We strive to give you quality time with your provider. You may need to reschedule your appointment if you arrive late (15 or more minutes).  Arriving late affects you and other patients whose appointments are after yours.  Also, if you miss three or more appointments without notifying the office, you may be dismissed from the clinic at the provider?s discretion.    ?  ?For prescription refill requests, have your pharmacy contact our office and allow 72 hours for refills to be completed.   ? ?Today you received the following chemotherapy and/or immunotherapy agents Pump stop port deaccess    ?  ?To help prevent nausea and vomiting after your treatment, we encourage you to take your nausea medication as directed. ? ?BELOW ARE SYMPTOMS THAT SHOULD BE REPORTED IMMEDIATELY: ?*FEVER GREATER THAN 100.4 F (38 ?C) OR HIGHER ?*CHILLS OR SWEATING ?*NAUSEA AND VOMITING THAT IS NOT CONTROLLED WITH YOUR NAUSEA MEDICATION ?*UNUSUAL SHORTNESS OF BREATH ?*UNUSUAL BRUISING OR BLEEDING ?*URINARY PROBLEMS (pain or burning when urinating, or frequent urination) ?*BOWEL PROBLEMS (unusual diarrhea, constipation, pain near the anus) ?TENDERNESS IN MOUTH AND THROAT WITH OR WITHOUT PRESENCE OF ULCERS (sore throat, sores in mouth, or a toothache) ?UNUSUAL RASH, SWELLING OR PAIN  ?UNUSUAL VAGINAL DISCHARGE OR ITCHING  ? ?Items with * indicate a potential emergency and should be followed up as soon as possible or go to the Emergency Department if any problems should occur. ? ?Please show the CHEMOTHERAPY ALERT CARD or IMMUNOTHERAPY ALERT CARD at check-in to the  Emergency Department and triage nurse. ? ?Should you have questions after your visit or need to cancel or reschedule your appointment, please contact [facility] [phone]  and follow the prompts.  Office hours are 8:00 a.m. to 4:30 p.m. Monday - Friday. Please note that voicemails left after 4:00 p.m. may not be returned until the following business day.  We are closed weekends and major holidays. You have access to a nurse at all times for urgent questions. Please call the main number to the clinic [phone] and follow the prompts. ? ?For any non-urgent questions, you may also contact your provider using MyChart. We now offer e-Visits for anyone 13 and older to request care online for non-urgent symptoms. For details visit [email or web address] ?  ?Also download the MyChart app! Go to the app store, search "MyChart", open the app, select [facility], and log in with your MyChart username and password. ? ?Due to Covid, a mask is required upon entering the hospital/clinic. If you do not have a mask, one will be given to you upon arrival. For doctor visits, patients may have 1 support person aged 4 or older with them. For treatment visits, patients cannot have anyone with them due to current Covid guidelines and our immunocompromised population.  ?

## 2022-04-06 NOTE — Progress Notes (Signed)
Patient presents today for 5FU pump stop and disconnection after 46 hour continous infusion.   5FU pump deaccessed.  Patients port flushed without difficulty.  Good blood return noted with no bruising or swelling noted at site.  Needle removed intact.  Band aid applied.  VSS with discharge and left in satisfactory condition ambulatory with no s/s of distress noted.    ?

## 2022-04-17 ENCOUNTER — Inpatient Hospital Stay (HOSPITAL_COMMUNITY): Payer: 59 | Attending: Hematology

## 2022-04-17 ENCOUNTER — Inpatient Hospital Stay (HOSPITAL_COMMUNITY): Payer: 59

## 2022-04-17 DIAGNOSIS — Z5189 Encounter for other specified aftercare: Secondary | ICD-10-CM | POA: Insufficient documentation

## 2022-04-17 DIAGNOSIS — R072 Precordial pain: Secondary | ICD-10-CM | POA: Diagnosis not present

## 2022-04-17 DIAGNOSIS — C180 Malignant neoplasm of cecum: Secondary | ICD-10-CM

## 2022-04-17 DIAGNOSIS — Z5112 Encounter for antineoplastic immunotherapy: Secondary | ICD-10-CM | POA: Diagnosis present

## 2022-04-17 DIAGNOSIS — Z5111 Encounter for antineoplastic chemotherapy: Secondary | ICD-10-CM | POA: Insufficient documentation

## 2022-04-17 DIAGNOSIS — T451X5A Adverse effect of antineoplastic and immunosuppressive drugs, initial encounter: Secondary | ICD-10-CM | POA: Diagnosis not present

## 2022-04-17 DIAGNOSIS — C189 Malignant neoplasm of colon, unspecified: Secondary | ICD-10-CM

## 2022-04-17 DIAGNOSIS — Z95828 Presence of other vascular implants and grafts: Secondary | ICD-10-CM

## 2022-04-17 DIAGNOSIS — D701 Agranulocytosis secondary to cancer chemotherapy: Secondary | ICD-10-CM | POA: Diagnosis not present

## 2022-04-17 LAB — CBC WITH DIFFERENTIAL/PLATELET
Abs Immature Granulocytes: 0.01 10*3/uL (ref 0.00–0.07)
Basophils Absolute: 0 10*3/uL (ref 0.0–0.1)
Basophils Relative: 1 %
Eosinophils Absolute: 0.1 10*3/uL (ref 0.0–0.5)
Eosinophils Relative: 2 %
HCT: 31 % — ABNORMAL LOW (ref 39.0–52.0)
Hemoglobin: 10 g/dL — ABNORMAL LOW (ref 13.0–17.0)
Immature Granulocytes: 0 %
Lymphocytes Relative: 57 %
Lymphs Abs: 1.9 10*3/uL (ref 0.7–4.0)
MCH: 28 pg (ref 26.0–34.0)
MCHC: 32.3 g/dL (ref 30.0–36.0)
MCV: 86.8 fL (ref 80.0–100.0)
Monocytes Absolute: 0.3 10*3/uL (ref 0.1–1.0)
Monocytes Relative: 9 %
Neutro Abs: 1 10*3/uL — ABNORMAL LOW (ref 1.7–7.7)
Neutrophils Relative %: 31 %
Platelets: 170 10*3/uL (ref 150–400)
RBC: 3.57 MIL/uL — ABNORMAL LOW (ref 4.22–5.81)
RDW: 18.7 % — ABNORMAL HIGH (ref 11.5–15.5)
WBC: 3.4 10*3/uL — ABNORMAL LOW (ref 4.0–10.5)
nRBC: 0 % (ref 0.0–0.2)

## 2022-04-17 LAB — COMPREHENSIVE METABOLIC PANEL
ALT: 7 U/L (ref 0–44)
AST: 15 U/L (ref 15–41)
Albumin: 3.5 g/dL (ref 3.5–5.0)
Alkaline Phosphatase: 47 U/L (ref 38–126)
Anion gap: 6 (ref 5–15)
BUN: 10 mg/dL (ref 8–23)
CO2: 26 mmol/L (ref 22–32)
Calcium: 8.6 mg/dL — ABNORMAL LOW (ref 8.9–10.3)
Chloride: 106 mmol/L (ref 98–111)
Creatinine, Ser: 1.02 mg/dL (ref 0.61–1.24)
GFR, Estimated: >60 mL/min (ref >60)
Glucose, Bld: 100 mg/dL — ABNORMAL HIGH (ref 70–99)
Potassium: 3.5 mmol/L (ref 3.5–5.1)
Sodium: 138 mmol/L (ref 135–145)
Total Bilirubin: 0.8 mg/dL (ref 0.3–1.2)
Total Protein: 7.2 g/dL (ref 6.5–8.1)

## 2022-04-17 LAB — MAGNESIUM: Magnesium: 1.8 mg/dL (ref 1.7–2.4)

## 2022-04-17 MED ORDER — SODIUM CHLORIDE 0.9 % IV SOLN: 180.0000 mg/m2 | Freq: Once | INTRAVENOUS | Status: DC

## 2022-04-17 MED ORDER — SODIUM CHLORIDE 0.9% FLUSH
10.0000 mL | INTRAVENOUS | Status: DC | PRN
  Administered 2022-04-17: 10 mL

## 2022-04-17 MED ORDER — FLUOROURACIL CHEMO INJECTION 2.5 GM/50ML: 400.0000 mg/m2 | Freq: Once | INTRAVENOUS | Status: DC

## 2022-04-17 MED ORDER — ATROPINE SULFATE 1 MG/ML IV SOLN: 0.5000 mg | Freq: Once | INTRAVENOUS | Status: DC | PRN

## 2022-04-17 MED ORDER — PALONOSETRON HCL INJECTION 0.25 MG/5ML: 0.2500 mg | Freq: Once | INTRAVENOUS | Status: DC

## 2022-04-17 MED ORDER — SODIUM CHLORIDE 0.9 % IV SOLN: 400.0000 mg/m2 | Freq: Once | INTRAVENOUS | Status: DC

## 2022-04-17 MED ORDER — SODIUM CHLORIDE 0.9 % IV SOLN: 2400.0000 mg/m2 | INTRAVENOUS | Status: DC

## 2022-04-17 MED ORDER — HEPARIN SOD (PORK) LOCK FLUSH 100 UNIT/ML IV SOLN
500.0000 [IU] | Freq: Once | INTRAVENOUS | Status: AC | PRN
  Administered 2022-04-17: 500 [IU]

## 2022-04-17 MED ORDER — SODIUM CHLORIDE 0.9 % IV SOLN: 10.0000 mg | Freq: Once | INTRAVENOUS | Status: DC

## 2022-04-17 MED ORDER — SODIUM CHLORIDE 0.9 % IV SOLN: 5.0000 mg/kg | Freq: Once | INTRAVENOUS | Status: DC

## 2022-04-17 MED ORDER — SODIUM CHLORIDE 0.9 % IV SOLN: Freq: Once | INTRAVENOUS | Status: DC

## 2022-04-17 NOTE — Progress Notes (Signed)
Patient presents today for Zirabev/FOLFIRI with 5FU pump start.  Vital signs within parameters for treatment.  Patient has no new complaints at this time.  ANC noted to be 1, Dr. Benay Spice notified.  Message received from Dr. Benay Spice hold treatment, patient to come back in one week for labs and Udenyca to be given on Day three with next cycle. ? ?Discharge from clinic ambulatory in stable condition.  Alert and oriented X 3.  Follow up with [facility] as scheduled.  ?

## 2022-04-17 NOTE — Patient Instructions (Signed)
Evan [name]  Discharge Instructions: ?Thank you for choosing [facility] to provide your oncology and hematology care.  ?If you have a lab appointment with the [facility], please come in thru the Main Entrance and check in at the main information desk. ? ?Wear comfortable clothing and clothing appropriate for easy access to any Portacath or PICC line.  ? ?We strive to give you quality time with your provider. You may need to reschedule your appointment if you arrive late (15 or more minutes).  Arriving late affects you and other patients whose appointments are after yours.  Also, if you miss three or more appointments without notifying the office, you may be dismissed from the clinic at the provider?s discretion.    ?  ?For prescription refill requests, have your pharmacy contact our office and allow 72 hours for refills to be completed.   ? ?Today you received the following chemotherapy and/or immunotherapy agents no treatment today    ?  ?To help prevent nausea and vomiting after your treatment, we encourage you to take your nausea medication as directed. ? ?BELOW ARE SYMPTOMS THAT SHOULD BE REPORTED IMMEDIATELY: ?*FEVER GREATER THAN 100.4 F (38 ?C) OR HIGHER ?*CHILLS OR SWEATING ?*NAUSEA AND VOMITING THAT IS NOT CONTROLLED WITH YOUR NAUSEA MEDICATION ?*UNUSUAL SHORTNESS OF BREATH ?*UNUSUAL BRUISING OR BLEEDING ?*URINARY PROBLEMS (pain or burning when urinating, or frequent urination) ?*BOWEL PROBLEMS (unusual diarrhea, constipation, pain near the anus) ?TENDERNESS IN MOUTH AND THROAT WITH OR WITHOUT PRESENCE OF ULCERS (sore throat, sores in mouth, or a toothache) ?UNUSUAL RASH, SWELLING OR PAIN  ?UNUSUAL VAGINAL DISCHARGE OR ITCHING  ? ?Items with * indicate a potential emergency and should be followed up as soon as possible or go to the Emergency Department if any problems should occur. ? ?Please show the CHEMOTHERAPY ALERT CARD or IMMUNOTHERAPY ALERT CARD at check-in to the  Emergency Department and triage nurse. ? ?Should you have questions after your visit or need to cancel or reschedule your appointment, please contact [facility] [phone]  and follow the prompts.  Office hours are 8:00 a.m. to 4:30 p.m. Monday - Friday. Please note that voicemails left after 4:00 p.m. may not be returned until the following business day.  We are closed weekends and major holidays. You have access to a nurse at all times for urgent questions. Please call the main number to the clinic [phone] and follow the prompts. ? ?For any non-urgent questions, you may also contact your provider using MyChart. We now offer e-Visits for anyone 75 and older to request care online for non-urgent symptoms. For details visit [email or web address] ?  ?Also download the MyChart app! Go to the app store, search "MyChart", open the app, select [facility], and log in with your MyChart username and password. ? ?Due to Covid, a mask is required upon entering the hospital/clinic. If you do not have a mask, one will be given to you upon arrival. For doctor visits, patients may have 1 support person aged 25 or older with them. For treatment visits, patients cannot have anyone with them due to current Covid guidelines and our immunocompromised population.  ?

## 2022-04-18 LAB — CEA: CEA: 15.6 ng/mL — ABNORMAL HIGH (ref 0.0–4.7)

## 2022-04-19 ENCOUNTER — Encounter (HOSPITAL_COMMUNITY): Payer: 59

## 2022-04-24 ENCOUNTER — Encounter (HOSPITAL_COMMUNITY): Payer: Self-pay

## 2022-04-24 ENCOUNTER — Inpatient Hospital Stay (HOSPITAL_COMMUNITY): Payer: 59

## 2022-04-24 VITALS — BP 166/95 | HR 64

## 2022-04-24 DIAGNOSIS — D702 Other drug-induced agranulocytosis: Secondary | ICD-10-CM | POA: Insufficient documentation

## 2022-04-24 DIAGNOSIS — Z5112 Encounter for antineoplastic immunotherapy: Secondary | ICD-10-CM | POA: Diagnosis not present

## 2022-04-24 DIAGNOSIS — Z95828 Presence of other vascular implants and grafts: Secondary | ICD-10-CM

## 2022-04-24 DIAGNOSIS — C189 Malignant neoplasm of colon, unspecified: Secondary | ICD-10-CM

## 2022-04-24 LAB — CBC WITH DIFFERENTIAL/PLATELET
Abs Immature Granulocytes: 0.03 10*3/uL (ref 0.00–0.07)
Basophils Absolute: 0.1 10*3/uL (ref 0.0–0.1)
Basophils Relative: 1 %
Eosinophils Absolute: 0.1 10*3/uL (ref 0.0–0.5)
Eosinophils Relative: 4 %
HCT: 35.6 % — ABNORMAL LOW (ref 39.0–52.0)
Hemoglobin: 11.5 g/dL — ABNORMAL LOW (ref 13.0–17.0)
Immature Granulocytes: 1 %
Lymphocytes Relative: 61 %
Lymphs Abs: 2.1 10*3/uL (ref 0.7–4.0)
MCH: 28.8 pg (ref 26.0–34.0)
MCHC: 32.3 g/dL (ref 30.0–36.0)
MCV: 89 fL (ref 80.0–100.0)
Monocytes Absolute: 0.6 10*3/uL (ref 0.1–1.0)
Monocytes Relative: 16 %
Neutro Abs: 0.6 10*3/uL — ABNORMAL LOW (ref 1.7–7.7)
Neutrophils Relative %: 17 %
Platelets: 197 10*3/uL (ref 150–400)
RBC: 4 MIL/uL — ABNORMAL LOW (ref 4.22–5.81)
RDW: 21.3 % — ABNORMAL HIGH (ref 11.5–15.5)
WBC: 3.5 10*3/uL — ABNORMAL LOW (ref 4.0–10.5)
nRBC: 0 % (ref 0.0–0.2)

## 2022-04-24 LAB — COMPREHENSIVE METABOLIC PANEL
ALT: 7 U/L (ref 0–44)
AST: 18 U/L (ref 15–41)
Albumin: 4 g/dL (ref 3.5–5.0)
Alkaline Phosphatase: 49 U/L (ref 38–126)
Anion gap: 5 (ref 5–15)
BUN: 9 mg/dL (ref 8–23)
CO2: 27 mmol/L (ref 22–32)
Calcium: 9 mg/dL (ref 8.9–10.3)
Chloride: 108 mmol/L (ref 98–111)
Creatinine, Ser: 0.86 mg/dL (ref 0.61–1.24)
GFR, Estimated: >60 mL/min (ref >60)
Glucose, Bld: 84 mg/dL (ref 70–99)
Potassium: 3.6 mmol/L (ref 3.5–5.1)
Sodium: 140 mmol/L (ref 135–145)
Total Bilirubin: 0.6 mg/dL (ref 0.3–1.2)
Total Protein: 7.7 g/dL (ref 6.5–8.1)

## 2022-04-24 LAB — MAGNESIUM: Magnesium: 1.8 mg/dL (ref 1.7–2.4)

## 2022-04-24 MED ORDER — FILGRASTIM-SNDZ 480 MCG/0.8ML IJ SOSY
480.0000 ug | PREFILLED_SYRINGE | Freq: Once | INTRAMUSCULAR | Status: AC
  Administered 2022-04-24: 480 ug via SUBCUTANEOUS
  Filled 2022-04-24: qty 0.8

## 2022-04-24 MED ORDER — HEPARIN SOD (PORK) LOCK FLUSH 100 UNIT/ML IV SOLN
500.0000 [IU] | Freq: Once | INTRAVENOUS | Status: AC
  Administered 2022-04-24: 500 [IU] via INTRAVENOUS

## 2022-04-24 MED ORDER — SODIUM CHLORIDE 0.9% FLUSH
10.0000 mL | Freq: Once | INTRAVENOUS | Status: AC
  Administered 2022-04-24: 10 mL via INTRAVENOUS

## 2022-04-24 NOTE — Patient Instructions (Signed)
Evan [name]  Discharge Instructions: ?Thank you for choosing [facility] to provide your oncology and hematology care.  ?If you have a lab appointment with the [facility], please come in thru the Main Entrance and check in at the main information desk. ? ?Wear comfortable clothing and clothing appropriate for easy access to any Portacath or PICC line.  ? ?We strive to give you quality time with your provider. You may need to reschedule your appointment if you arrive late (15 or more minutes).  Arriving late affects you and other patients whose appointments are after yours.  Also, if you miss three or more appointments without notifying the office, you may be dismissed from the clinic at the provider?s discretion.    ?  ?For prescription refill requests, have your pharmacy contact our office and allow 72 hours for refills to be completed.   ? ?Today you received the following Zarxio injection, return tomorrow for possible treatment. ?  ?To help prevent nausea and vomiting after your treatment, we encourage you to take your nausea medication as directed. ? ?BELOW ARE SYMPTOMS THAT SHOULD BE REPORTED IMMEDIATELY: ?*FEVER GREATER THAN 100.4 F (38 ?C) OR HIGHER ?*CHILLS OR SWEATING ?*NAUSEA AND VOMITING THAT IS NOT CONTROLLED WITH YOUR NAUSEA MEDICATION ?*UNUSUAL SHORTNESS OF BREATH ?*UNUSUAL BRUISING OR BLEEDING ?*URINARY PROBLEMS (pain or burning when urinating, or frequent urination) ?*BOWEL PROBLEMS (unusual diarrhea, constipation, pain near the anus) ?TENDERNESS IN MOUTH AND THROAT WITH OR WITHOUT PRESENCE OF ULCERS (sore throat, sores in mouth, or a toothache) ?UNUSUAL RASH, SWELLING OR PAIN  ?UNUSUAL VAGINAL DISCHARGE OR ITCHING  ? ?Items with * indicate a potential emergency and should be followed up as soon as possible or go to the Emergency Department if any problems should occur. ? ?Please show the CHEMOTHERAPY ALERT CARD or IMMUNOTHERAPY ALERT CARD at check-in to the Emergency  Department and triage nurse. ? ?Should you have questions after your visit or need to cancel or reschedule your appointment, please contact [facility] [phone]  and follow the prompts.  Office hours are 8:00 a.m. to 4:30 p.m. Monday - Friday. Please note that voicemails left after 4:00 p.m. may not be returned until the following business day.  We are closed weekends and major holidays. You have access to a nurse at all times for urgent questions. Please call the main number to the clinic [phone] and follow the prompts. ? ?For any non-urgent questions, you may also contact your provider using MyChart. We now offer e-Visits for anyone 50 and older to request care online for non-urgent symptoms. For details visit [email or web address]. ?  ?Also download the MyChart app! Go to the app store, search "MyChart", open the app, select [facility], and log in with your MyChart username and password. ? ?Due to Covid, a mask is required upon entering the hospital/clinic. If you do not have a mask, one will be given to you upon arrival. For doctor visits, patients may have 1 support person aged 76 or older with them. For treatment visits, patients cannot have anyone with them due to current Covid guidelines and our immunocompromised population.  ?

## 2022-04-24 NOTE — Progress Notes (Signed)
Patient presents today for treatment. ANC is 0.6, Dr. Delton Coombes made aware. Received orders to hold treatment today, give Zarxio injection and have patient return tomorrow for possible treatment. Patient and pharmacy made aware. Per Dr. Delton Coombes patient is to receive Udenyca on Day 3 of treatment, pharmacy aware. ?Port flushed with good blood return noted. No bruising or swelling at site.  ?Patient tolerated injection with no complaints voiced. Site clean and dry with no bruising or swelling noted at site. See MAR for details. Band aid applied.  Patient stable during and after injection. VSS with discharge and left in satisfactory condition with no s/s of distress noted.  ? ?

## 2022-04-25 ENCOUNTER — Other Ambulatory Visit: Payer: Self-pay

## 2022-04-25 ENCOUNTER — Other Ambulatory Visit (HOSPITAL_COMMUNITY): Payer: Self-pay | Admitting: Hematology

## 2022-04-25 ENCOUNTER — Inpatient Hospital Stay (HOSPITAL_COMMUNITY): Payer: 59

## 2022-04-25 ENCOUNTER — Encounter (HOSPITAL_COMMUNITY): Payer: Self-pay

## 2022-04-25 VITALS — BP 160/86 | HR 53 | Temp 97.6°F | Resp 18

## 2022-04-25 VITALS — BP 174/103 | HR 70 | Resp 16

## 2022-04-25 DIAGNOSIS — R0789 Other chest pain: Secondary | ICD-10-CM

## 2022-04-25 DIAGNOSIS — C180 Malignant neoplasm of cecum: Secondary | ICD-10-CM | POA: Diagnosis not present

## 2022-04-25 DIAGNOSIS — Z95828 Presence of other vascular implants and grafts: Secondary | ICD-10-CM

## 2022-04-25 DIAGNOSIS — Z5112 Encounter for antineoplastic immunotherapy: Secondary | ICD-10-CM | POA: Diagnosis not present

## 2022-04-25 DIAGNOSIS — Z5189 Encounter for other specified aftercare: Secondary | ICD-10-CM | POA: Diagnosis not present

## 2022-04-25 DIAGNOSIS — Z5111 Encounter for antineoplastic chemotherapy: Secondary | ICD-10-CM | POA: Diagnosis not present

## 2022-04-25 DIAGNOSIS — D702 Other drug-induced agranulocytosis: Secondary | ICD-10-CM

## 2022-04-25 LAB — CBC
HCT: 37.2 % — ABNORMAL LOW (ref 39.0–52.0)
Hemoglobin: 12 g/dL — ABNORMAL LOW (ref 13.0–17.0)
MCH: 28.8 pg (ref 26.0–34.0)
MCHC: 32.3 g/dL (ref 30.0–36.0)
MCV: 89.4 fL (ref 80.0–100.0)
Platelets: 185 10*3/uL (ref 150–400)
RBC: 4.16 MIL/uL — ABNORMAL LOW (ref 4.22–5.81)
RDW: 21.8 % — ABNORMAL HIGH (ref 11.5–15.5)
WBC: 7.9 10*3/uL (ref 4.0–10.5)
nRBC: 0.5 % — ABNORMAL HIGH (ref 0.0–0.2)

## 2022-04-25 LAB — CBC WITH DIFFERENTIAL/PLATELET
Abs Immature Granulocytes: 0.15 10*3/uL — ABNORMAL HIGH (ref 0.00–0.07)
Basophils Absolute: 0.1 10*3/uL (ref 0.0–0.1)
Basophils Relative: 1 %
Eosinophils Absolute: 0.2 10*3/uL (ref 0.0–0.5)
Eosinophils Relative: 2 %
HCT: 37.1 % — ABNORMAL LOW (ref 39.0–52.0)
Hemoglobin: 12 g/dL — ABNORMAL LOW (ref 13.0–17.0)
Immature Granulocytes: 2 %
Lymphocytes Relative: 34 %
Lymphs Abs: 2.6 10*3/uL (ref 0.7–4.0)
MCH: 29 pg (ref 26.0–34.0)
MCHC: 32.3 g/dL (ref 30.0–36.0)
MCV: 89.6 fL (ref 80.0–100.0)
Monocytes Absolute: 1.2 10*3/uL — ABNORMAL HIGH (ref 0.1–1.0)
Monocytes Relative: 15 %
Neutro Abs: 3.5 10*3/uL (ref 1.7–7.7)
Neutrophils Relative %: 46 %
Platelets: 184 10*3/uL (ref 150–400)
RBC: 4.14 MIL/uL — ABNORMAL LOW (ref 4.22–5.81)
RDW: 21.8 % — ABNORMAL HIGH (ref 11.5–15.5)
WBC Morphology: REACTIVE
WBC: 7.7 10*3/uL (ref 4.0–10.5)
nRBC: 0.5 % — ABNORMAL HIGH (ref 0.0–0.2)

## 2022-04-25 LAB — CK TOTAL AND CKMB (NOT AT ARMC)
CK, MB: 2.8 ng/mL (ref 0.5–5.0)
Relative Index: INVALID (ref 0.0–2.5)
Total CK: 84 U/L (ref 49–397)

## 2022-04-25 LAB — TROPONIN I (HIGH SENSITIVITY): Troponin I (High Sensitivity): 4 ng/L

## 2022-04-25 MED ORDER — SODIUM CHLORIDE 0.9 % IV SOLN
180.0000 mg/m2 | Freq: Once | INTRAVENOUS | Status: AC
  Administered 2022-04-25: 360 mg via INTRAVENOUS
  Filled 2022-04-25: qty 15

## 2022-04-25 MED ORDER — SODIUM CHLORIDE 0.9 % IV SOLN
INTRAVENOUS | Status: DC
Start: 2022-04-25 — End: 2022-04-25

## 2022-04-25 MED ORDER — SODIUM CHLORIDE 0.9 % IV SOLN
10.0000 mg | Freq: Once | INTRAVENOUS | Status: AC
  Administered 2022-04-25: 10 mg via INTRAVENOUS
  Filled 2022-04-25: qty 10

## 2022-04-25 MED ORDER — MORPHINE SULFATE (PF) 2 MG/ML IV SOLN
2.0000 mg | Freq: Once | INTRAVENOUS | Status: AC
  Administered 2022-04-25: 2 mg via INTRAVENOUS
  Filled 2022-04-25: qty 1

## 2022-04-25 MED ORDER — SODIUM CHLORIDE 0.9 % IV SOLN
5.0000 mg/kg | Freq: Once | INTRAVENOUS | Status: AC
  Administered 2022-04-25: 400 mg via INTRAVENOUS
  Filled 2022-04-25: qty 16

## 2022-04-25 MED ORDER — SODIUM CHLORIDE 0.9% FLUSH: 10.0000 mL | INTRAVENOUS | Status: DC | PRN

## 2022-04-25 MED ORDER — HYDROCODONE-ACETAMINOPHEN 5-325 MG PO TABS: 1.0000 | ORAL_TABLET | Freq: Three times a day (TID) | ORAL | 0 refills | Status: DC | PRN

## 2022-04-25 MED ORDER — SODIUM CHLORIDE 0.9 % IV SOLN
5000.0000 mg | INTRAVENOUS | Status: DC
  Administered 2022-04-25: 5000 mg via INTRAVENOUS
  Filled 2022-04-25: qty 100

## 2022-04-25 MED ORDER — ATROPINE SULFATE 1 MG/ML IV SOLN
0.5000 mg | Freq: Once | INTRAVENOUS | Status: AC
  Administered 2022-04-25: 0.5 mg via INTRAVENOUS
  Filled 2022-04-25: qty 1

## 2022-04-25 MED ORDER — PALONOSETRON HCL INJECTION 0.25 MG/5ML
0.2500 mg | Freq: Once | INTRAVENOUS | Status: AC
  Administered 2022-04-25: 0.25 mg via INTRAVENOUS
  Filled 2022-04-25: qty 5

## 2022-04-25 MED ORDER — SODIUM CHLORIDE 0.9 % IV SOLN
400.0000 mg/m2 | Freq: Once | INTRAVENOUS | Status: AC
  Administered 2022-04-25: 792 mg via INTRAVENOUS
  Filled 2022-04-25: qty 39.6

## 2022-04-25 MED ORDER — FLUOROURACIL CHEMO INJECTION 2.5 GM/50ML
400.0000 mg/m2 | Freq: Once | INTRAVENOUS | Status: AC
  Administered 2022-04-25: 800 mg via INTRAVENOUS
  Filled 2022-04-25: qty 16

## 2022-04-25 MED ORDER — SODIUM CHLORIDE 0.9 % IV SOLN: Freq: Once | INTRAVENOUS | Status: AC

## 2022-04-25 MED ORDER — HEPARIN SOD (PORK) LOCK FLUSH 100 UNIT/ML IV SOLN: 500.0000 [IU] | Freq: Once | INTRAVENOUS | Status: DC | PRN

## 2022-04-25 NOTE — Progress Notes (Signed)
Patient presents today for possible treatment.  ?Patient arrived at clinic in 10/10 sternum pain, patient in tears, states it started this morning around 4:00 am. Dr. Larey Seat made aware, 2 mg of Morphine and EKG ordered. BP elevated and showed normal sinus rhythm. Patient aware to start taking Claritin today for injection on 5/18. ?Labs redrawn WBC 7.9, Dr. Delton Coombes made aware and is okay to proceed with treatment before [place] results. ?[place] resulted 3.5. ?Patient reported pain 10/10 at 1140, Dr. Delton Coombes made aware and RN received orders for 2mg  of Morphine and additional labs. Pain medication sent to patient's pharmacy, RN will call patient tomorrow to reassess pain.   ?Patient tolerated chemotherapy with no complaints voiced. Side effects with management reviewed understanding verbalized. Port site clean and dry with no bruising or swelling noted at site. Good blood return noted before and after administration of chemotherapy. Chemopump connected with no alarms noted, Patient left in satisfactory condition with VSS and no s/s of distress noted.  ? ?

## 2022-04-25 NOTE — Patient Instructions (Signed)
[facility]  Discharge Instructions: ?Thank you for choosing [facility] to provide your oncology and hematology care.  ?If you have a lab appointment with the [facility], please come in thru the Main Entrance and check in at the main information desk. ? ?Wear comfortable clothing and clothing appropriate for easy access to any Portacath or PICC line.  ? ?We strive to give you quality time with your provider. You may need to reschedule your appointment if you arrive late (15 or more minutes).  Arriving late affects you and other patients whose appointments are after yours.  Also, if you miss three or more appointments without notifying the office, you may be dismissed from the clinic at the provider?s discretion.    ?  ?For prescription refill requests, have your pharmacy contact our office and allow 72 hours for refills to be completed.   ? ?Today you received the following chemotherapy and/or immunotherapy agents Bevacizumab, FOLFIRI, and morphine. Return as scheduled. ?  ?To help prevent nausea and vomiting after your treatment, we encourage you to take your nausea medication as directed. ? ?BELOW ARE SYMPTOMS THAT SHOULD BE REPORTED IMMEDIATELY: ?*FEVER GREATER THAN 100.4 F (38 ?C) OR HIGHER ?*CHILLS OR SWEATING ?*NAUSEA AND VOMITING THAT IS NOT CONTROLLED WITH YOUR NAUSEA MEDICATION ?*UNUSUAL SHORTNESS OF BREATH ?*UNUSUAL BRUISING OR BLEEDING ?*URINARY PROBLEMS (pain or burning when urinating, or frequent urination) ?*BOWEL PROBLEMS (unusual diarrhea, constipation, pain near the anus) ?TENDERNESS IN MOUTH AND THROAT WITH OR WITHOUT PRESENCE OF ULCERS (sore throat, sores in mouth, or a toothache) ?UNUSUAL RASH, SWELLING OR PAIN  ?UNUSUAL VAGINAL DISCHARGE OR ITCHING  ? ?Items with * indicate a potential emergency and should be followed up as soon as possible or go to the Emergency Department if any problems should occur. ? ?Please show the CHEMOTHERAPY ALERT CARD or IMMUNOTHERAPY  ALERT CARD at check-in to the Emergency Department and triage nurse. ? ?Should you have questions after your visit or need to cancel or reschedule your appointment, please contact [facility] [phone]  and follow the prompts.  Office hours are 8:00 a.m. to 4:30 p.m. Monday - Friday. Please note that voicemails left after 4:00 p.m. may not be returned until the following business day.  We are closed weekends and major holidays. You have access to a nurse at all times for urgent questions. Please call the main number to the clinic [phone] and follow the prompts. ? ?For any non-urgent questions, you may also contact your provider using MyChart. We now offer e-Visits for anyone 60 and older to request care online for non-urgent symptoms. For details visit [email or web address]. ?  ?Also download the MyChart app! Go to the app store, search "MyChart", open the app, select [facility], and log in with your MyChart username and password. ? ?Due to Covid, a mask is required upon entering the hospital/clinic. If you do not have a mask, one will be given to you upon arrival. For doctor visits, patients may have 1 support person aged 72 or older with them. For treatment visits, patients cannot have anyone with them due to current Covid guidelines and our immunocompromised population.  ?

## 2022-04-26 ENCOUNTER — Telehealth (HOSPITAL_COMMUNITY): Payer: Self-pay

## 2022-04-26 ENCOUNTER — Other Ambulatory Visit (HOSPITAL_COMMUNITY): Payer: Self-pay | Admitting: *Deleted

## 2022-04-26 ENCOUNTER — Inpatient Hospital Stay (HOSPITAL_COMMUNITY): Payer: 59

## 2022-04-26 NOTE — Telephone Encounter (Signed)
RN called patient to check on pain level, patient states he was able to pick up pain medication yesterday and reports his pain level is a lot better today and will return to the clinic tomorrow for pump d/c. ?

## 2022-04-27 ENCOUNTER — Inpatient Hospital Stay (HOSPITAL_COMMUNITY): Payer: 59

## 2022-04-27 ENCOUNTER — Ambulatory Visit (HOSPITAL_COMMUNITY): Payer: 59

## 2022-04-27 VITALS — BP 118/77 | HR 59 | Temp 96.2°F | Resp 18

## 2022-04-27 DIAGNOSIS — Z5112 Encounter for antineoplastic immunotherapy: Secondary | ICD-10-CM | POA: Diagnosis not present

## 2022-04-27 DIAGNOSIS — Z95828 Presence of other vascular implants and grafts: Secondary | ICD-10-CM

## 2022-04-27 DIAGNOSIS — C180 Malignant neoplasm of cecum: Secondary | ICD-10-CM

## 2022-04-27 MED ORDER — HEPARIN SOD (PORK) LOCK FLUSH 100 UNIT/ML IV SOLN
500.0000 [IU] | Freq: Once | INTRAVENOUS | Status: AC | PRN
  Administered 2022-04-27: 500 [IU]

## 2022-04-27 MED ORDER — SODIUM CHLORIDE 0.9% FLUSH
10.0000 mL | INTRAVENOUS | Status: DC | PRN
  Administered 2022-04-27: 10 mL

## 2022-04-27 MED ORDER — PEGFILGRASTIM-CBQV 6 MG/0.6ML ~~LOC~~ SOSY
6.0000 mg | PREFILLED_SYRINGE | Freq: Once | SUBCUTANEOUS | Status: AC
  Administered 2022-04-27: 6 mg via SUBCUTANEOUS
  Filled 2022-04-27: qty 0.6

## 2022-04-27 NOTE — Patient Instructions (Signed)
[facility]  Discharge Instructions: Thank you for choosing [facility] to provide your oncology and hematology care.  If you have a lab appointment with the [facility], please come in thru the Main Entrance and check in at the main information desk.  Wear comfortable clothing and clothing appropriate for easy access to any Portacath or PICC line.   We strive to give you quality time with your provider. You may need to reschedule your appointment if you arrive late (15 or more minutes).  Arriving late affects you and other patients whose appointments are after yours.  Also, if you miss three or more appointments without notifying the office, you may be dismissed from the clinic at the provider's discretion.      For prescription refill requests, have your pharmacy contact our office and allow 72 hours for refills to be completed.    Today you received Udenyca and pump disconnection.     BELOW ARE SYMPTOMS THAT SHOULD BE REPORTED IMMEDIATELY: *FEVER GREATER THAN 100.4 F (38 C) OR HIGHER *CHILLS OR SWEATING *NAUSEA AND VOMITING THAT IS NOT CONTROLLED WITH YOUR NAUSEA MEDICATION *UNUSUAL SHORTNESS OF BREATH *UNUSUAL BRUISING OR BLEEDING *URINARY PROBLEMS (pain or burning when urinating, or frequent urination) *BOWEL PROBLEMS (unusual diarrhea, constipation, pain near the anus) TENDERNESS IN MOUTH AND THROAT WITH OR WITHOUT PRESENCE OF ULCERS (sore throat, sores in mouth, or a toothache) UNUSUAL RASH, SWELLING OR PAIN  UNUSUAL VAGINAL DISCHARGE OR ITCHING   Items with * indicate a potential emergency and should be followed up as soon as possible or go to the Emergency Department if any problems should occur.  Please show the CHEMOTHERAPY ALERT CARD or IMMUNOTHERAPY ALERT CARD at check-in to the Emergency Department and triage nurse.  Should you have questions after your visit or need to cancel or reschedule your appointment, please contact [facility] [phone]  and follow the prompts.  Office hours are 8:00 a.m. to 4:30 p.m. Monday - Friday. Please note that voicemails left after 4:00 p.m. may not be returned until the following business day.  We are closed weekends and major holidays. You have access to a nurse at all times for urgent questions. Please call the main number to the clinic [phone] and follow the prompts.  For any non-urgent questions, you may also contact your provider using MyChart. We now offer e-Visits for anyone 59 and older to request care online for non-urgent symptoms. For details visit [email or web address].   Also download the MyChart app! Go to the app store, search "MyChart", open the app, select [facility], and log in with your MyChart username and password.  Due to Covid, a mask is required upon entering the hospital/clinic. If you do not have a mask, one will be given to you upon arrival. For doctor visits, patients may have 1 support person aged 79 or older with them. For treatment visits, patients cannot have anyone with them due to current Covid guidelines and our immunocompromised population.

## 2022-04-27 NOTE — Progress Notes (Signed)
Pt presents today for 5FU chemotherapy pump disconnection and Udenyca injection per provider's order. Vital signs stable. Port flushed easily without difficulty with 77mL NS and 50mL of heparin. Good blood return noted, needle removed intact and no bruising or swelling noted at this time.   Discharged from clinic ambulatory in stable condition. Alert and oriented x 3. F/U with [facility] as scheduled.

## 2022-05-02 ENCOUNTER — Ambulatory Visit (HOSPITAL_COMMUNITY): Payer: 59

## 2022-05-02 ENCOUNTER — Ambulatory Visit (HOSPITAL_COMMUNITY): Payer: 59 | Admitting: Hematology

## 2022-05-02 ENCOUNTER — Other Ambulatory Visit (HOSPITAL_COMMUNITY): Payer: 59

## 2022-05-03 ENCOUNTER — Other Ambulatory Visit (HOSPITAL_COMMUNITY): Payer: Self-pay | Admitting: *Deleted

## 2022-05-03 MED ORDER — HYDROCODONE-ACETAMINOPHEN 5-325 MG PO TABS: 1.0000 | ORAL_TABLET | Freq: Three times a day (TID) | ORAL | 0 refills | Status: DC | PRN

## 2022-05-04 ENCOUNTER — Encounter (HOSPITAL_COMMUNITY): Payer: 59

## 2022-05-09 ENCOUNTER — Inpatient Hospital Stay (HOSPITAL_COMMUNITY): Payer: 59

## 2022-05-09 ENCOUNTER — Inpatient Hospital Stay (HOSPITAL_BASED_OUTPATIENT_CLINIC_OR_DEPARTMENT_OTHER): Payer: 59 | Admitting: Hematology

## 2022-05-09 VITALS — BP 175/98 | HR 55

## 2022-05-09 VITALS — BP 158/86 | HR 52 | Temp 96.2°F | Resp 18

## 2022-05-09 DIAGNOSIS — C189 Malignant neoplasm of colon, unspecified: Secondary | ICD-10-CM

## 2022-05-09 DIAGNOSIS — C180 Malignant neoplasm of cecum: Secondary | ICD-10-CM

## 2022-05-09 DIAGNOSIS — Z95828 Presence of other vascular implants and grafts: Secondary | ICD-10-CM

## 2022-05-09 DIAGNOSIS — Z5112 Encounter for antineoplastic immunotherapy: Secondary | ICD-10-CM | POA: Diagnosis not present

## 2022-05-09 LAB — COMPREHENSIVE METABOLIC PANEL
ALT: 7 U/L (ref 0–44)
AST: 16 U/L (ref 15–41)
Albumin: 3.6 g/dL (ref 3.5–5.0)
Alkaline Phosphatase: 75 U/L (ref 38–126)
Anion gap: 5 (ref 5–15)
BUN: 8 mg/dL (ref 8–23)
CO2: 26 mmol/L (ref 22–32)
Calcium: 8.8 mg/dL — ABNORMAL LOW (ref 8.9–10.3)
Chloride: 108 mmol/L (ref 98–111)
Creatinine, Ser: 0.87 mg/dL (ref 0.61–1.24)
GFR, Estimated: >60 mL/min (ref >60)
Glucose, Bld: 93 mg/dL (ref 70–99)
Potassium: 3.4 mmol/L — ABNORMAL LOW (ref 3.5–5.1)
Sodium: 139 mmol/L (ref 135–145)
Total Bilirubin: 0.1 mg/dL — ABNORMAL LOW (ref 0.3–1.2)
Total Protein: 7 g/dL (ref 6.5–8.1)

## 2022-05-09 LAB — URINALYSIS, DIPSTICK ONLY
Bilirubin Urine: NEGATIVE
Glucose, UA: NEGATIVE mg/dL
Ketones, ur: NEGATIVE mg/dL
Leukocytes,Ua: NEGATIVE
Nitrite: NEGATIVE
Protein, ur: NEGATIVE mg/dL
Specific Gravity, Urine: 1.009 (ref 1.005–1.030)
pH: 5 (ref 5.0–8.0)

## 2022-05-09 LAB — CBC WITH DIFFERENTIAL/PLATELET
Band Neutrophils: 9 %
Basophils Absolute: 0.2 10*3/uL — ABNORMAL HIGH (ref 0.0–0.1)
Basophils Relative: 1 %
Blasts: 1 %
Eosinophils Absolute: 0.2 10*3/uL (ref 0.0–0.5)
Eosinophils Relative: 1 %
HCT: 33.3 % — ABNORMAL LOW (ref 39.0–52.0)
Hemoglobin: 10.6 g/dL — ABNORMAL LOW (ref 13.0–17.0)
Lymphocytes Relative: 24 %
Lymphs Abs: 3.7 10*3/uL (ref 0.7–4.0)
MCH: 29 pg (ref 26.0–34.0)
MCHC: 31.8 g/dL (ref 30.0–36.0)
MCV: 91.2 fL (ref 80.0–100.0)
Metamyelocytes Relative: 2 %
Monocytes Absolute: 0.6 10*3/uL (ref 0.1–1.0)
Monocytes Relative: 4 %
Myelocytes: 2 %
Neutro Abs: 10.1 10*3/uL — ABNORMAL HIGH (ref 1.7–7.7)
Neutrophils Relative %: 56 %
Platelets: 147 10*3/uL — ABNORMAL LOW (ref 150–400)
RBC: 3.65 MIL/uL — ABNORMAL LOW (ref 4.22–5.81)
RDW: 21.5 % — ABNORMAL HIGH (ref 11.5–15.5)
WBC: 15.6 10*3/uL — ABNORMAL HIGH (ref 4.0–10.5)
nRBC: 1.2 % — ABNORMAL HIGH (ref 0.0–0.2)

## 2022-05-09 LAB — MAGNESIUM: Magnesium: 1.7 mg/dL (ref 1.7–2.4)

## 2022-05-09 MED ORDER — FLUOROURACIL CHEMO INJECTION 2.5 GM/50ML
400.0000 mg/m2 | Freq: Once | INTRAVENOUS | Status: AC
  Administered 2022-05-09: 800 mg via INTRAVENOUS
  Filled 2022-05-09: qty 16

## 2022-05-09 MED ORDER — AMLODIPINE BESYLATE 10 MG PO TABS: 10.0000 mg | ORAL_TABLET | Freq: Every day | ORAL | 3 refills | Status: DC

## 2022-05-09 MED ORDER — SODIUM CHLORIDE 0.9 % IV SOLN
10.0000 mg | Freq: Once | INTRAVENOUS | Status: AC
  Administered 2022-05-09: 10 mg via INTRAVENOUS
  Filled 2022-05-09: qty 10

## 2022-05-09 MED ORDER — SODIUM CHLORIDE 0.9 % IV SOLN
400.0000 mg/m2 | Freq: Once | INTRAVENOUS | Status: DC
  Filled 2022-05-09: qty 39.6

## 2022-05-09 MED ORDER — ATROPINE SULFATE 1 MG/ML IV SOLN
0.5000 mg | Freq: Once | INTRAVENOUS | Status: AC
  Administered 2022-05-09: 0.5 mg via INTRAVENOUS
  Filled 2022-05-09: qty 1

## 2022-05-09 MED ORDER — SODIUM CHLORIDE 0.9 % IV SOLN
5000.0000 mg | INTRAVENOUS | Status: DC
  Administered 2022-05-09: 5000 mg via INTRAVENOUS
  Filled 2022-05-09: qty 100

## 2022-05-09 MED ORDER — SODIUM CHLORIDE 0.9 % IV SOLN: Freq: Once | INTRAVENOUS | Status: AC

## 2022-05-09 MED ORDER — SODIUM CHLORIDE 0.9 % IV SOLN
180.0000 mg/m2 | Freq: Once | INTRAVENOUS | Status: AC
  Administered 2022-05-09: 360 mg via INTRAVENOUS
  Filled 2022-05-09: qty 3

## 2022-05-09 MED ORDER — SODIUM CHLORIDE 0.9 % IV SOLN
5.0000 mg/kg | Freq: Once | INTRAVENOUS | Status: AC
  Administered 2022-05-09: 400 mg via INTRAVENOUS
  Filled 2022-05-09: qty 16

## 2022-05-09 MED ORDER — SODIUM CHLORIDE 0.9 % IV SOLN
400.0000 mg/m2 | Freq: Once | INTRAVENOUS | Status: AC
  Administered 2022-05-09: 792 mg via INTRAVENOUS
  Filled 2022-05-09: qty 39.6

## 2022-05-09 MED ORDER — PALONOSETRON HCL INJECTION 0.25 MG/5ML
0.2500 mg | Freq: Once | INTRAVENOUS | Status: AC
  Administered 2022-05-09: 0.25 mg via INTRAVENOUS
  Filled 2022-05-09: qty 5

## 2022-05-09 MED ORDER — AMLODIPINE BESYLATE 5 MG PO TABS
10.0000 mg | ORAL_TABLET | Freq: Once | ORAL | Status: AC
  Administered 2022-05-09: 10 mg via ORAL
  Filled 2022-05-09: qty 2

## 2022-05-09 NOTE — Progress Notes (Signed)
Patient has been examined by Dr. Delton Coombes, and vital signs and labs have been reviewed. ANC, Creatinine, LFTs, hemoglobin, and platelets are within treatment parameters per M.D. - pt may proceed with treatment.   We will treat hypertension w/Norvasc 10 mg po in clinic x 1 dose. Rx sent to pt's pharmacy for same.

## 2022-05-09 NOTE — Patient Instructions (Signed)
[facility]  Discharge Instructions: Thank you for choosing [facility] to provide your oncology and hematology care.  If you have a lab appointment with the [facility], please come in thru the Main Entrance and check in at the main information desk.  Wear comfortable clothing and clothing appropriate for easy access to any Portacath or PICC line.   We strive to give you quality time with your provider. You may need to reschedule your appointment if you arrive late (15 or more minutes).  Arriving late affects you and other patients whose appointments are after yours.  Also, if you miss three or more appointments without notifying the office, you may be dismissed from the clinic at the provider's discretion.      For prescription refill requests, have your pharmacy contact our office and allow 72 hours for refills to be completed.    Today you received the following chemotherapy and/or immunotherapy agents Zirabev and Folfiri   To help prevent nausea and vomiting after your treatment, we encourage you to take your nausea medication as directed.  The chemotherapy medication bag should finish at 46 hours, 96 hours, or 7 days. For example, if your pump is scheduled for 46 hours and it was put on at 4:00 p.m., it should finish at 2:00 p.m. the day it is scheduled to come off regardless of your appointment time.     Estimated time to finish at 1100.   If the display on your pump reads "Low Volume" and it is beeping, take the batteries out of the pump and come to the cancer center for it to be taken off.   If the pump alarms go off prior to the pump reading "Low Volume" then call [phone] and someone can assist you.  If the plunger comes out and the chemotherapy medication is leaking out, please use your home chemo spill kit to clean up the spill. Do NOT use paper towels or other household products.  If you have problems or questions regarding your pump, please  call either [phone] (24 hours a day) or the cancer center Monday-Friday 8:00 a.m.- 4:30 p.m. at the clinic number and we will assist you. If you are unable to get assistance, then go to the nearest Emergency Department and ask the staff to contact the IV team for assistance.    BELOW ARE SYMPTOMS THAT SHOULD BE REPORTED IMMEDIATELY: *FEVER GREATER THAN 100.4 F (38 C) OR HIGHER *CHILLS OR SWEATING *NAUSEA AND VOMITING THAT IS NOT CONTROLLED WITH YOUR NAUSEA MEDICATION *UNUSUAL SHORTNESS OF BREATH *UNUSUAL BRUISING OR BLEEDING *URINARY PROBLEMS (pain or burning when urinating, or frequent urination) *BOWEL PROBLEMS (unusual diarrhea, constipation, pain near the anus) TENDERNESS IN MOUTH AND THROAT WITH OR WITHOUT PRESENCE OF ULCERS (sore throat, sores in mouth, or a toothache) UNUSUAL RASH, SWELLING OR PAIN  UNUSUAL VAGINAL DISCHARGE OR ITCHING   Items with * indicate a potential emergency and should be followed up as soon as possible or go to the Emergency Department if any problems should occur.    Please show the CHEMOTHERAPY ALERT CARD or IMMUNOTHERAPY ALERT CARD at check-in to the Emergency Department and triage nurse.  Should you have questions after your visit or need to cancel or reschedule your appointment, please contact [facility] [phone]  and follow the prompts.  Office hours are 8:00 a.m. to 4:30 p.m. Monday - Friday. Please note that voicemails left after 4:00 p.m. may not be returned until the following  business day.  We are closed weekends and major holidays. You have access to a nurse at all times for urgent questions. Please call the main number to the clinic [phone] and follow the prompts.  For any non-urgent questions, you may also contact your provider using MyChart. We now offer e-Visits for anyone 23 and older to request care online for non-urgent symptoms. For details visit [email or web address].   Also download the MyChart app! Go to the  app store, search "MyChart", open the app, select [facility], and log in with your MyChart username and password.  Due to Covid, a mask is required upon entering the hospital/clinic. If you do not have a mask, one will be given to you upon arrival. For doctor visits, patients may have 1 support person aged 65 or older with them. For treatment visits, patients cannot have anyone with them due to current Covid guidelines and our immunocompromised population.

## 2022-05-09 NOTE — Patient Instructions (Addendum)
[facility] at [facility] Discharge Instructions   You were seen and examined today by Dr. Delton Coombes.  He reviewed the results of your lab work which is normal/stable.   We will proceed with your treatment today.  Your blood pressure is high - we will give you medication in the clinic for this and send a prescription as well.   We will repeat a CT scan after this treatment.   Return as scheduled.      Thank you for choosing [facility] at [facility] to provide your oncology and hematology care.  To afford each patient quality time with our provider, please arrive at least 15 minutes before your scheduled appointment time.   If you have a lab appointment with the [facility] please come in thru the Main Entrance and check in at the main information desk.  You need to re-schedule your appointment should you arrive 10 or more minutes late.  We strive to give you quality time with our providers, and arriving late affects you and other patients whose appointments are after yours.  Also, if you no show three or more times for appointments you may be dismissed from the clinic at the providers discretion.     Again, thank you for choosing [facility].  Our hope is that these requests will decrease the amount of time that you wait before being seen by our physicians.       _____________________________________________________________  Should you have questions after your visit to [facility], please contact our office at [phone] and follow the prompts.  Our office hours are 8:00 a.m. and 4:30 p.m. Monday - Friday.  Please note that voicemails left after 4:00 p.m. may not be returned until the following business day.  We are closed weekends and major holidays.  You do have access to a nurse 24-7, just call the main number to the clinic [phone] and do not press any options, hold on the line and a nurse will answer  the phone.    For prescription refill requests, have your pharmacy contact our office and allow 72 hours.    Due to Covid, you will need to wear a mask upon entering the hospital. If you do not have a mask, a mask will be given to you at the Main Entrance upon arrival. For doctor visits, patients may have 1 support person age 79 or older with them. For treatment visits, patients can not have anyone with them due to social distancing guidelines and our immunocompromised population.

## 2022-05-09 NOTE — Progress Notes (Signed)
Pt presents today for Zirabev and Folfiri per provider's order. Labs and other vital signs WNL for treatment today. BP was 175/98 today. Dr.K stated to give Norvac 10 mg p.o x 1 dose while in the clinic today. BP will be rechecked  after Norvasc administration and prior to Avastin. New RX sent to pharmacy for continuous use of  Norvasc 10 mg p.o daily per Dr.K. BP rechecked 151/90. Okay to proceed with treatment today per Dr.K  Evan [name] and [facility] given today per MD orders. Tolerated infusion without adverse affects. Vital signs stable. No complaints at this time. Discharged from clinic ambulatory in stable condition. Alert and oriented x 3. F/U with [facility] as scheduled.  5FU ambulatory pump infusing.

## 2022-05-09 NOTE — Progress Notes (Signed)
[place], [place] [address]   CLINIC:  Medical Oncology/Hematology  PCP:  Lavella Lemons, PA [address] 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  HER2 negative.  TMB low.  MSI-stable.  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  Other targetable mutations negative.  CURRENT THERAPY: FOLFIRI / BEVACIZUMAB Q14D  BRIEF ONCOLOGIC HISTORY:  Oncology History  Cecal cancer ([facility])  10/07/2019 Initial Diagnosis   Cecal cancer ([facility])    10/07/2019 Cancer Staging   Staging form: Colon and Rectum, AJCC 8th Edition - Clinical stage from 10/07/2019: Stage IIIB (cT3, cN1, cM0) - Signed by Derek Jack, MD on 10/07/2019    02/22/2022 -  Chemotherapy   Patient is on Treatment Plan : COLORECTAL FOLFIRI / BEVACIZUMAB Q14D        CANCER STAGING:  Cancer Staging  Cecal cancer ([facility]) Staging form: Colon and Rectum, AJCC 8th Edition - Clinical stage from 10/07/2019: Stage IIIB (cT3, cN1, cM0) - Signed by Derek Jack, MD on 10/07/2019 - Pathologic stage from 01/23/2022: Stage IVB (rpTX, pN0, pM1b) - Unsigned   INTERVAL HISTORY:  [name], a 66 y.o. male, returns for routine follow-up and consideration for next cycle of chemotherapy. Evan [name] was last seen on 04/04/2022.  Due for cycle #6 of FOLFIRI / BEVACIZUMAB today.   Overall, he tells me he has been feeling pretty well. He reports occasional throbbing chest pain; this pain has been present since receiving his injection, but it is milder than the chest pain he had prior to cycle 2. He denies cough, hemoptysis, diarrhea, abdominal cramping, and back pain.  He reports constipation. He reports hair thinning. His appetite is good.   Overall, he feels ready for next cycle of chemo today.    REVIEW OF SYSTEMS:  Review of Systems   Constitutional:  Negative for appetite change and fatigue.  Respiratory:  Negative for cough.   Cardiovascular:  Positive for chest pain (occasional).  Gastrointestinal:  Positive for constipation. Negative for abdominal pain and diarrhea.  Musculoskeletal:  Negative for back pain.  All other systems reviewed and are negative.  PAST MEDICAL/SURGICAL HISTORY:  Past Medical History:  Diagnosis Date   Arthritis    Colon cancer ([facility])    colon   Hepatitis C    Hypertension    Port-A-Cath in place 02/15/2022   Past Surgical History:  Procedure Laterality Date   COLON SURGERY     PORTACATH PLACEMENT Right 02/08/2022   Procedure: INSERTION PORT-A-CATH- RIJ;  Surgeon: Rusty Aus, DO;  Location: [facility];  Service: General;  Laterality: Right;   REPLACEMENT TOTAL KNEE Left    SHOULDER ARTHROSCOPY Bilateral    TOTAL HIP ARTHROPLASTY Right 11/09/2020   Procedure: TOTAL HIP ARTHROPLASTY ANTERIOR APPROACH;  Surgeon: Renette Butters, MD;  Location: [facility];  Service: Orthopedics;  Laterality: Right;   WRIST SURGERY Left     SOCIAL HISTORY:  Social History   Socioeconomic History   Marital status: Married    Spouse name: Not on file   Number of children: 7   Years of education: Not on file   Highest education level: Not on file  Occupational History   Occupation: employed  Tobacco Use   Smoking status: Former    Packs/day: 1.50    Years: 20.00  Pack years: 30.00    Types: Cigarettes    Quit date: 11/19/2005    Years since quitting: 16.4   Smokeless tobacco: Never  Vaping Use   Vaping Use: Never used  Substance and Sexual Activity   Alcohol use: Not Currently   Drug use: Never   Sexual activity: Yes  Other Topics Concern   Not on file  Social History Narrative   ** Merged History Encounter **       Separated from wife 11/2021   Social Determinants of Health   Financial Resource Strain: Not on file  Food Insecurity: Not on file  Transportation Needs: Not on  file  Physical Activity: Not on file  Stress: Not on file  Social Connections: Not on file  Intimate Partner Violence: Not on file    FAMILY HISTORY:  Family History  Problem Relation Age of Onset   Heart disease Mother    Dementia Father    Heart disease Sister    Cancer Brother    Hypertension Brother    Hypertension Brother    Healthy Son    Healthy Son    Healthy Son    Healthy Son    Healthy Daughter    Healthy Daughter    Healthy Daughter     CURRENT MEDICATIONS:  Current Outpatient Medications  Medication Sig Dispense Refill   Bevacizumab (AVASTIN IV) Inject into the vein every 14 (fourteen) days. *start date TBD     fluorouracil CALGB 46962 2,400 mg/m2 in sodium chloride 0.9 % 150 mL Inject 2,400 mg/m2 into the vein over 48 hr.     FLUOROURACIL IV Inject into the vein every 14 (fourteen) days.     HYDROcodone-acetaminophen (NORCO) 5-325 MG tablet Take 1 tablet by mouth every 8 (eight) hours as needed for moderate pain. 84 tablet 0   IRINOTECAN HCL IV Inject into the vein every 14 (fourteen) days.     Lactulose 20 GM/30ML SOLN Take 15 mLs (10 g total) by mouth at bedtime. Take 15 ml at bedtime every night to assist with regular bowel movements.  Titrate down if having multiple bowel movements.  If a bowel movement has not occurred in 3 to 4 days or longer, then take 15 ml every 3 hours until a bowel movent has occurred. 450 mL 1   LEUCOVORIN CALCIUM IV Inject into the vein every 14 (fourteen) days.     traZODone (DESYREL) 50 MG tablet TAKE ONE TABLET BY MOUTH AT BEDTIME 30 tablet 0   vitamin B-12 (CYANOCOBALAMIN) 1000 MCG tablet Take 1 tablet (1,000 mcg total) by mouth daily. 30 tablet 6   lidocaine-prilocaine (EMLA) cream Apply a small amount to port a cath site (do not rub in) and cover with plastic wrap 1 hour prior to infusion appointments (Patient not taking: Reported on 05/09/2022) 30 g 3   prochlorperazine (COMPAZINE) 10 MG tablet Take 1 tablet (10 mg total) by  mouth every 6 (six) hours as needed (NAUSEA). (Patient not taking: Reported on 05/09/2022) 30 tablet 1   No current facility-administered medications for this visit.    ALLERGIES:  No Known Allergies  PHYSICAL EXAM:  Performance status (ECOG): 0 - Asymptomatic  There were no vitals filed for this visit. Wt Readings from Last 3 Encounters:  05/09/22 181 lb (82.1 kg)  04/24/22 175 lb 4.8 oz (79.5 kg)  04/17/22 176 lb 9.6 oz (80.1 kg)   Physical Exam Vitals reviewed.  Constitutional:      Appearance: Normal appearance.  Cardiovascular:  Rate and Rhythm: Normal rate and regular rhythm.     Pulses: Normal pulses.     Heart sounds: Normal heart sounds.  Pulmonary:     Effort: Pulmonary effort is normal.     Breath sounds: Normal breath sounds.  Neurological:     General: No focal deficit present.     Mental Status: He is alert and oriented to person, place, and time.  Psychiatric:        Mood and Affect: Mood normal.        Behavior: Behavior normal.    LABORATORY DATA:  I have reviewed the labs as listed.     Latest Ref Rng & Units 05/09/2022    7:57 AM 04/25/2022    9:03 AM 04/24/2022    8:05 AM  CBC  WBC 4.0 - 10.5 K/uL 15.6   7.9     7.7   3.5    Hemoglobin 13.0 - 17.0 g/dL 10.6   12.0     12.0   11.5    Hematocrit 39.0 - 52.0 % 33.3   37.2     37.1   35.6    Platelets 150 - 400 K/uL 147   185     184   197        Latest Ref Rng & Units 05/09/2022    7:57 AM 04/24/2022    8:05 AM 04/17/2022    8:06 AM  CMP  Glucose 70 - 99 mg/dL 93   84   100    BUN 8 - 23 mg/dL '8   9   10    ' Creatinine 0.61 - 1.24 mg/dL 0.87   0.86   1.02    Sodium 135 - 145 mmol/L 139   140   138    Potassium 3.5 - 5.1 mmol/L 3.4   3.6   3.5    Chloride 98 - 111 mmol/L 108   108   106    CO2 22 - 32 mmol/L '26   27   26    ' Calcium 8.9 - 10.3 mg/dL 8.8   9.0   8.6    Total Protein 6.5 - 8.1 g/dL 7.0   7.7   7.2    Total Bilirubin 0.3 - 1.2 mg/dL 0.1   0.6   0.8    Alkaline Phos 38 - 126  U/L 75   49   47    AST 15 - 41 U/L '16   18   15    ' ALT 0 - 44 U/L '7   7   7      ' DIAGNOSTIC IMAGING:  I have independently reviewed the scans and discussed with the patient. No results found.   ASSESSMENT:  Left lung mass with multiple lung nodules: - Presentation with dry cough for 6 months. - 30 pound weight loss in the last couple of years, weight stable over the last 6 months. - CT chest with contrast on 12/16/2021 showed bulky left hilar mass measuring 8.1 x 7.5 cm.  Numerous bilateral lung nodules of varying sizes.  Left adrenal nodule measuring 2.8 x 2.2 cm.  Mediastinal and bilateral hilar adenopathy with the largest pretracheal node measuring 4.1 x 3.1 cm. - MRI of the brain from 01/04/2022 which was negative for metastatic disease. - CTAP from 01/03/2022 which showed isolated left adrenal metastasis with no other evidence of metastatic disease in the abdomen or pelvis. - Pathology of left lung biopsy which shows adenocarcinoma with necrosis.  CK20 positive and CDX2 positive but negative for CK7 indicating colonic primary. 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  HER2 negative.  TMB low.  MSI-stable.  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  Other targetable mutations negative. - FOLFIRI started on 02/22/2022, bevacizumab added with cycle 4   Social/family history: - Lives by himself.  He paints houses for living. - He quit smoking 12 years back and started back again 1 and half year ago and smoked half pack per day.  He quit again about 1 week ago. - Father had cancer, type unknown to the patient.  Brother died of brain tumor.  3.  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  Margins negative.  No lymphovascular or perineural invasion. - Received 3 cycles of XELOX followed by Xeloda for total of 6 months.  Oxaliplatin discontinued during cycle 4 secondary to transaminitis, elevated bilirubin and thrombocytopenia.   However he was also treated for hep C with Harvoni after that.   PLAN:  Metastatic colon cancer to the lungs and left adrenal gland: - He denies any cough or hemoptysis. - He has mild constipation. - He will proceed with cycle 6 today.  Reviewed labs which showed normal LFTs and CBC.  Last CEA was 15.6 on 04/17/2022. - RTC 2 weeks for follow-up with repeat CT CAP and CEA levels.  2.  Lower rib/epigastric pain: - He had retrosternal throbbing pain after G-CSF injection after last cycle.  We have added long-acting G-CSF after cycle 5 to prevent any delays in chemotherapy.  He is having milder pain after long-acting G-CSF, last pain on Friday. - Use hydrocodone 5 mg as needed.  3.  Normocytic anemia: - Continue B12 1 mg tablet daily.  4.  Difficulty falling asleep: - Continue trazodone 50 mg at bedtime which is helping.  5.  Hypertension: - Blood pressure is 175/95 today.  We will add Norvasc 10 mg daily.   Orders placed this encounter:  Orders Placed This Encounter  Procedures   CT CHEST Morton, MD [place] [phone]   I, Thana Ates, am acting as a scribe for Dr. Derek Jack.  I, Derek Jack MD, have reviewed the above documentation for accuracy and completeness, and I agree with the above.

## 2022-05-10 LAB — CEA: CEA: 11.6 ng/mL — ABNORMAL HIGH (ref 0.0–4.7)

## 2022-05-11 ENCOUNTER — Encounter (HOSPITAL_COMMUNITY): Payer: Self-pay

## 2022-05-11 ENCOUNTER — Inpatient Hospital Stay (HOSPITAL_COMMUNITY): Payer: 59 | Attending: Hematology

## 2022-05-11 VITALS — BP 115/75 | HR 62 | Temp 97.9°F | Resp 18

## 2022-05-11 DIAGNOSIS — C7801 Secondary malignant neoplasm of right lung: Secondary | ICD-10-CM | POA: Insufficient documentation

## 2022-05-11 DIAGNOSIS — C7972 Secondary malignant neoplasm of left adrenal gland: Secondary | ICD-10-CM | POA: Insufficient documentation

## 2022-05-11 DIAGNOSIS — R1013 Epigastric pain: Secondary | ICD-10-CM | POA: Insufficient documentation

## 2022-05-11 DIAGNOSIS — K5900 Constipation, unspecified: Secondary | ICD-10-CM | POA: Insufficient documentation

## 2022-05-11 DIAGNOSIS — D649 Anemia, unspecified: Secondary | ICD-10-CM | POA: Insufficient documentation

## 2022-05-11 DIAGNOSIS — Z87891 Personal history of nicotine dependence: Secondary | ICD-10-CM | POA: Diagnosis not present

## 2022-05-11 DIAGNOSIS — Z5111 Encounter for antineoplastic chemotherapy: Secondary | ICD-10-CM | POA: Insufficient documentation

## 2022-05-11 DIAGNOSIS — Z5112 Encounter for antineoplastic immunotherapy: Secondary | ICD-10-CM | POA: Diagnosis present

## 2022-05-11 DIAGNOSIS — I10 Essential (primary) hypertension: Secondary | ICD-10-CM | POA: Insufficient documentation

## 2022-05-11 DIAGNOSIS — C7802 Secondary malignant neoplasm of left lung: Secondary | ICD-10-CM | POA: Diagnosis not present

## 2022-05-11 DIAGNOSIS — C180 Malignant neoplasm of cecum: Secondary | ICD-10-CM | POA: Diagnosis present

## 2022-05-11 DIAGNOSIS — Z79899 Other long term (current) drug therapy: Secondary | ICD-10-CM | POA: Insufficient documentation

## 2022-05-11 DIAGNOSIS — Z95828 Presence of other vascular implants and grafts: Secondary | ICD-10-CM

## 2022-05-11 DIAGNOSIS — Z5189 Encounter for other specified aftercare: Secondary | ICD-10-CM | POA: Diagnosis not present

## 2022-05-11 DIAGNOSIS — Z1509 Genetic susceptibility to other malignant neoplasm: Secondary | ICD-10-CM | POA: Insufficient documentation

## 2022-05-11 MED ORDER — PEGFILGRASTIM-CBQV 6 MG/0.6ML ~~LOC~~ SOSY
6.0000 mg | PREFILLED_SYRINGE | Freq: Once | SUBCUTANEOUS | Status: AC
  Administered 2022-05-11: 6 mg via SUBCUTANEOUS
  Filled 2022-05-11: qty 0.6

## 2022-05-11 MED ORDER — HEPARIN SOD (PORK) LOCK FLUSH 100 UNIT/ML IV SOLN
500.0000 [IU] | Freq: Once | INTRAVENOUS | Status: AC | PRN
  Administered 2022-05-11: 500 [IU]

## 2022-05-11 MED ORDER — SODIUM CHLORIDE 0.9% FLUSH
10.0000 mL | INTRAVENOUS | Status: DC | PRN
  Administered 2022-05-11: 10 mL

## 2022-05-11 NOTE — Patient Instructions (Addendum)
[facility]  Discharge Instructions: Thank you for choosing [facility] to provide your oncology and hematology care.  If you have a lab appointment with the [facility], please come in thru the Main Entrance and check in at the main information desk.  Wear comfortable clothing and clothing appropriate for easy access to any Portacath or PICC line.   We strive to give you quality time with your provider. You may need to reschedule your appointment if you arrive late (15 or more minutes).  Arriving late affects you and other patients whose appointments are after yours.  Also, if you miss three or more appointments without notifying the office, you may be dismissed from the clinic at the provider's discretion.      For prescription refill requests, have your pharmacy contact our office and allow 72 hours for refills to be completed.       To help prevent nausea and vomiting after your treatment, we encourage you to take your nausea medication as directed.  BELOW ARE SYMPTOMS THAT SHOULD BE REPORTED IMMEDIATELY: *FEVER GREATER THAN 100.4 F (38 C) OR HIGHER *CHILLS OR SWEATING *NAUSEA AND VOMITING THAT IS NOT CONTROLLED WITH YOUR NAUSEA MEDICATION *UNUSUAL SHORTNESS OF BREATH *UNUSUAL BRUISING OR BLEEDING *URINARY PROBLEMS (pain or burning when urinating, or frequent urination) *BOWEL PROBLEMS (unusual diarrhea, constipation, pain near the anus) TENDERNESS IN MOUTH AND THROAT WITH OR WITHOUT PRESENCE OF ULCERS (sore throat, sores in mouth, or a toothache) UNUSUAL RASH, SWELLING OR PAIN  UNUSUAL VAGINAL DISCHARGE OR ITCHING   Items with * indicate a potential emergency and should be followed up as soon as possible or go to the Emergency Department if any problems should occur.  Please show the CHEMOTHERAPY ALERT CARD or IMMUNOTHERAPY ALERT CARD at check-in to the Emergency Department and triage nurse.  Should you have questions after your visit or need to cancel  or reschedule your appointment, please contact [facility] [phone]  and follow the prompts.  Office hours are 8:00 a.m. to 4:30 p.m. Monday - Friday. Please note that voicemails left after 4:00 p.m. may not be returned until the following business day.  We are closed weekends and major holidays. You have access to a nurse at all times for urgent questions. Please call the main number to the clinic [phone] and follow the prompts.  For any non-urgent questions, you may also contact your provider using MyChart. We now offer e-Visits for anyone 9 and older to request care online for non-urgent symptoms. For details visit [email or web address].   Also download the MyChart app! Go to the app store, search "MyChart", open the app, select [facility], and log in with your MyChart username and password.  Due to Covid, a mask is required upon entering the hospital/clinic. If you do not have a mask, one will be given to you upon arrival. For doctor visits, patients may have 1 support person aged 57 or older with them. For treatment visits, patients cannot have anyone with them due to current Covid guidelines and our immunocompromised population. [facility]  Discharge Instructions: Thank you for choosing [facility] to provide your oncology and hematology care.  If you have a lab appointment with the [facility], please come in thru the Main Entrance and check in at the main information desk.  Wear comfortable clothing and clothing appropriate for easy access to any Portacath or PICC line.   We strive to give you quality  time with your provider. You may need to reschedule your appointment if you arrive late (15 or more minutes).  Arriving late affects you and other patients whose appointments are after yours.  Also, if you miss three or more appointments without notifying the office, you may be dismissed from the clinic at the provider's discretion.      For  prescription refill requests, have your pharmacy contact our office and allow 72 hours for refills to be completed.    Today you received the following chemotherapy and/or immunotherapy agents udenyca      To help prevent nausea and vomiting after your treatment, we encourage you to take your nausea medication as directed.  BELOW ARE SYMPTOMS THAT SHOULD BE REPORTED IMMEDIATELY: *FEVER GREATER THAN 100.4 F (38 C) OR HIGHER *CHILLS OR SWEATING *NAUSEA AND VOMITING THAT IS NOT CONTROLLED WITH YOUR NAUSEA MEDICATION *UNUSUAL SHORTNESS OF BREATH *UNUSUAL BRUISING OR BLEEDING *URINARY PROBLEMS (pain or burning when urinating, or frequent urination) *BOWEL PROBLEMS (unusual diarrhea, constipation, pain near the anus) TENDERNESS IN MOUTH AND THROAT WITH OR WITHOUT PRESENCE OF ULCERS (sore throat, sores in mouth, or a toothache) UNUSUAL RASH, SWELLING OR PAIN  UNUSUAL VAGINAL DISCHARGE OR ITCHING   Items with * indicate a potential emergency and should be followed up as soon as possible or go to the Emergency Department if any problems should occur.  Please show the CHEMOTHERAPY ALERT CARD or IMMUNOTHERAPY ALERT CARD at check-in to the Emergency Department and triage nurse.  Should you have questions after your visit or need to cancel or reschedule your appointment, please contact [facility] [phone]  and follow the prompts.  Office hours are 8:00 a.m. to 4:30 p.m. Monday - Friday. Please note that voicemails left after 4:00 p.m. may not be returned until the following business day.  We are closed weekends and major holidays. You have access to a nurse at all times for urgent questions. Please call the main number to the clinic [phone] and follow the prompts.  For any non-urgent questions, you may also contact your provider using MyChart. We now offer e-Visits for anyone 54 and older to request care online for non-urgent symptoms. For details visit [email or web address].    Also download the MyChart app! Go to the app store, search "MyChart", open the app, select [facility] [place], and log in with your MyChart username and password.  Due to Covid, a mask is required upon entering the hospital/clinic. If you do not have a mask, one will be given to you upon arrival. For doctor visits, patients may have 1 support person aged 79 or older with them. For treatment visits, patients cannot have anyone with them due to current Covid guidelines and our immunocompromised population.

## 2022-05-11 NOTE — Progress Notes (Signed)
Patient tolerated injection with no complaints voiced.  Site clean and dry with no bruising or swelling noted at site.  See MAR for details.  Band aid applied.  Patient stable during and after injection.  Vss with discharge and left in satisfactory condition with no s/s of distress noted.    Patient for chemotherapy pump disconnect with no complaints voiced.  Patients port flushed without difficulty.  Good blood return noted with no bruising or swelling noted at site.  Band aid applied.  VSS with discharge and left ambulatory with no s/s of distress noted.

## 2022-05-17 ENCOUNTER — Encounter (HOSPITAL_BASED_OUTPATIENT_CLINIC_OR_DEPARTMENT_OTHER): Payer: Self-pay

## 2022-05-17 ENCOUNTER — Ambulatory Visit (HOSPITAL_BASED_OUTPATIENT_CLINIC_OR_DEPARTMENT_OTHER)
Admission: RE | Admit: 2022-05-17 | Discharge: 2022-05-17 | Disposition: A | Discharge status: Home / Self Care | Payer: 59 | Source: Ambulatory Visit | Attending: Hematology | Admitting: Hematology

## 2022-05-17 DIAGNOSIS — C189 Malignant neoplasm of colon, unspecified: Secondary | ICD-10-CM | POA: Diagnosis present

## 2022-05-17 MED ORDER — IOHEXOL 300 MG/ML  SOLN
100.0000 mL | Freq: Once | INTRAMUSCULAR | Status: AC | PRN
  Administered 2022-05-17: 85 mL via INTRAVENOUS

## 2022-05-17 MED ORDER — HEPARIN SOD (PORK) LOCK FLUSH 100 UNIT/ML IV SOLN
500.0000 [IU] | Freq: Once | INTRAVENOUS | Status: AC
  Administered 2022-05-17: 500 [IU] via INTRAVENOUS

## 2022-05-22 ENCOUNTER — Encounter (HOSPITAL_COMMUNITY): Payer: Self-pay | Admitting: Hematology

## 2022-05-23 ENCOUNTER — Inpatient Hospital Stay (HOSPITAL_COMMUNITY): Payer: 59

## 2022-05-23 ENCOUNTER — Inpatient Hospital Stay (HOSPITAL_BASED_OUTPATIENT_CLINIC_OR_DEPARTMENT_OTHER): Payer: 59 | Admitting: Hematology

## 2022-05-23 VITALS — BP 131/79 | HR 61 | Temp 97.5°F | Resp 18

## 2022-05-23 DIAGNOSIS — Z95828 Presence of other vascular implants and grafts: Secondary | ICD-10-CM

## 2022-05-23 DIAGNOSIS — C189 Malignant neoplasm of colon, unspecified: Secondary | ICD-10-CM

## 2022-05-23 DIAGNOSIS — D649 Anemia, unspecified: Secondary | ICD-10-CM

## 2022-05-23 DIAGNOSIS — Z5112 Encounter for antineoplastic immunotherapy: Secondary | ICD-10-CM | POA: Diagnosis not present

## 2022-05-23 DIAGNOSIS — C180 Malignant neoplasm of cecum: Secondary | ICD-10-CM

## 2022-05-23 DIAGNOSIS — E876 Hypokalemia: Secondary | ICD-10-CM

## 2022-05-23 LAB — CBC WITH DIFFERENTIAL/PLATELET
Abs Immature Granulocytes: 1.1 10*3/uL — ABNORMAL HIGH (ref 0.00–0.07)
Band Neutrophils: 4 %
Basophils Absolute: 0 10*3/uL (ref 0.0–0.1)
Basophils Relative: 0 %
Eosinophils Absolute: 0.1 10*3/uL (ref 0.0–0.5)
Eosinophils Relative: 1 %
HCT: 32.1 % — ABNORMAL LOW (ref 39.0–52.0)
Hemoglobin: 10.3 g/dL — ABNORMAL LOW (ref 13.0–17.0)
Lymphocytes Relative: 15 %
Lymphs Abs: 2 10*3/uL (ref 0.7–4.0)
MCH: 30 pg (ref 26.0–34.0)
MCHC: 32.1 g/dL (ref 30.0–36.0)
MCV: 93.6 fL (ref 80.0–100.0)
Metamyelocytes Relative: 1 %
Monocytes Absolute: 0.8 10*3/uL (ref 0.1–1.0)
Monocytes Relative: 6 %
Myelocytes: 7 %
Neutro Abs: 9.4 10*3/uL — ABNORMAL HIGH (ref 1.7–7.7)
Neutrophils Relative %: 66 %
Platelets: 199 10*3/uL (ref 150–400)
RBC: 3.43 MIL/uL — ABNORMAL LOW (ref 4.22–5.81)
RDW: 21.4 % — ABNORMAL HIGH (ref 11.5–15.5)
WBC: 13.4 10*3/uL — ABNORMAL HIGH (ref 4.0–10.5)
nRBC: 0.5 % — ABNORMAL HIGH (ref 0.0–0.2)

## 2022-05-23 LAB — COMPREHENSIVE METABOLIC PANEL
ALT: 8 U/L (ref 0–44)
AST: 15 U/L (ref 15–41)
Albumin: 3.5 g/dL (ref 3.5–5.0)
Alkaline Phosphatase: 78 U/L (ref 38–126)
Anion gap: 6 (ref 5–15)
BUN: 7 mg/dL — ABNORMAL LOW (ref 8–23)
CO2: 25 mmol/L (ref 22–32)
Calcium: 8.4 mg/dL — ABNORMAL LOW (ref 8.9–10.3)
Chloride: 107 mmol/L (ref 98–111)
Creatinine, Ser: 1.1 mg/dL (ref 0.61–1.24)
GFR, Estimated: >60 mL/min (ref >60)
Glucose, Bld: 105 mg/dL — ABNORMAL HIGH (ref 70–99)
Potassium: 3.1 mmol/L — ABNORMAL LOW (ref 3.5–5.1)
Sodium: 138 mmol/L (ref 135–145)
Total Bilirubin: 0.4 mg/dL (ref 0.3–1.2)
Total Protein: 6.8 g/dL (ref 6.5–8.1)

## 2022-05-23 LAB — MAGNESIUM: Magnesium: 1.7 mg/dL (ref 1.7–2.4)

## 2022-05-23 MED ORDER — SODIUM CHLORIDE 0.9 % IV SOLN
180.0000 mg/m2 | Freq: Once | INTRAVENOUS | Status: AC
  Administered 2022-05-23: 360 mg via INTRAVENOUS
  Filled 2022-05-23: qty 10

## 2022-05-23 MED ORDER — SODIUM CHLORIDE 0.9 % IV SOLN
5000.0000 mg | INTRAVENOUS | Status: DC
  Administered 2022-05-23: 5000 mg via INTRAVENOUS
  Filled 2022-05-23: qty 100

## 2022-05-23 MED ORDER — ATROPINE SULFATE 1 MG/ML IV SOLN
0.5000 mg | Freq: Once | INTRAVENOUS | Status: AC
  Administered 2022-05-23: 0.5 mg via INTRAVENOUS
  Filled 2022-05-23: qty 1

## 2022-05-23 MED ORDER — SODIUM CHLORIDE 0.9% FLUSH: 10.0000 mL | INTRAVENOUS | Status: DC | PRN

## 2022-05-23 MED ORDER — SODIUM CHLORIDE 0.9 % IV SOLN
10.0000 mg | Freq: Once | INTRAVENOUS | Status: AC
  Administered 2022-05-23: 10 mg via INTRAVENOUS
  Filled 2022-05-23: qty 10

## 2022-05-23 MED ORDER — PALONOSETRON HCL INJECTION 0.25 MG/5ML
0.2500 mg | Freq: Once | INTRAVENOUS | Status: AC
  Administered 2022-05-23: 0.25 mg via INTRAVENOUS
  Filled 2022-05-23: qty 5

## 2022-05-23 MED ORDER — SODIUM CHLORIDE 0.9 % IV SOLN
5.0000 mg/kg | Freq: Once | INTRAVENOUS | Status: AC
  Administered 2022-05-23: 400 mg via INTRAVENOUS
  Filled 2022-05-23: qty 16

## 2022-05-23 MED ORDER — FLUOROURACIL CHEMO INJECTION 2.5 GM/50ML
400.0000 mg/m2 | Freq: Once | INTRAVENOUS | Status: AC
  Administered 2022-05-23: 800 mg via INTRAVENOUS
  Filled 2022-05-23: qty 16

## 2022-05-23 MED ORDER — SODIUM CHLORIDE 0.9 % IV SOLN
400.0000 mg/m2 | Freq: Once | INTRAVENOUS | Status: AC
  Administered 2022-05-23: 792 mg via INTRAVENOUS
  Filled 2022-05-23: qty 39.6

## 2022-05-23 MED ORDER — POTASSIUM CHLORIDE CRYS ER 20 MEQ PO TBCR
40.0000 meq | EXTENDED_RELEASE_TABLET | Freq: Once | ORAL | Status: AC
  Administered 2022-05-23: 40 meq via ORAL
  Filled 2022-05-23: qty 2

## 2022-05-23 MED ORDER — SODIUM CHLORIDE 0.9 % IV SOLN: Freq: Once | INTRAVENOUS | Status: AC

## 2022-05-23 NOTE — Patient Instructions (Signed)
[facility] at [facility] Discharge Instructions   You were seen and examined today by Dr. Delton Coombes.  He reviewed the results of your CT scan which is stable. All lab work was normal/stable. Your potassium was slightly low. We will give you potassium pills in the clinic today. At home, eat fruits (bananas, oranges) that are high in potassium.   We will proceed with your treatment today.   Return as scheduled.    Thank you for choosing [facility] at [facility] to provide your oncology and hematology care.  To afford each patient quality time with our provider, please arrive at least 15 minutes before your scheduled appointment time.   If you have a lab appointment with the [facility] please come in thru the Main Entrance and check in at the main information desk.  You need to re-schedule your appointment should you arrive 10 or more minutes late.  We strive to give you quality time with our providers, and arriving late affects you and other patients whose appointments are after yours.  Also, if you no show three or more times for appointments you may be dismissed from the clinic at the providers discretion.     Again, thank you for choosing [facility].  Our hope is that these requests will decrease the amount of time that you wait before being seen by our physicians.       _____________________________________________________________  Should you have questions after your visit to [facility], please contact our office at [phone] and follow the prompts.  Our office hours are 8:00 a.m. and 4:30 p.m. Monday - Friday.  Please note that voicemails left after 4:00 p.m. may not be returned until the following business day.  We are closed weekends and major holidays.  You do have access to a nurse 24-7, just call the main number to the clinic [phone] and do not press any options, hold on the line and a nurse will  answer the phone.    For prescription refill requests, have your pharmacy contact our office and allow 72 hours.    Due to Covid, you will need to wear a mask upon entering the hospital. If you do not have a mask, a mask will be given to you at the Main Entrance upon arrival. For doctor visits, patients may have 1 support person age 65 or older with them. For treatment visits, patients can not have anyone with them due to social distancing guidelines and our immunocompromised population.

## 2022-05-23 NOTE — Progress Notes (Signed)
Patients port flushed without difficulty.  Good blood return noted with no bruising or swelling noted at site.  Stable during access and blood draw.  Patient to remain accessed for treatment. 

## 2022-05-23 NOTE — Progress Notes (Signed)
[place], [place] [address]   CLINIC:  Medical Oncology/Hematology  PCP:  Lavella Lemons, PA [address] 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  HER2 negative.  TMB low.  MSI-stable.  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  Other targetable mutations negative.  CURRENT THERAPY: FOLFIRI / BEVACIZUMAB Q14D  BRIEF ONCOLOGIC HISTORY:  Oncology History  Cecal cancer ([facility])  10/07/2019 Initial Diagnosis   Cecal cancer ([facility])   10/07/2019 Cancer Staging   Staging form: Colon and Rectum, AJCC 8th Edition - Clinical stage from 10/07/2019: Stage IIIB (cT3, cN1, cM0) - Signed by Derek Jack, MD on 10/07/2019   02/22/2022 -  Chemotherapy   Patient is on Treatment Plan : COLORECTAL FOLFIRI / BEVACIZUMAB Q14D       CANCER STAGING:  Cancer Staging  Cecal cancer ([facility]) Staging form: Colon and Rectum, AJCC 8th Edition - Clinical stage from 10/07/2019: Stage IIIB (cT3, cN1, cM0) - Signed by Derek Jack, MD on 10/07/2019 - Pathologic stage from 01/23/2022: Stage IVB (rpTX, pN0, pM1b) - Unsigned   INTERVAL HISTORY:  [name], a 66 y.o. male, returns for routine follow-up and consideration for next cycle of chemotherapy. Evan [name] was last seen on 05/09/2022.  Due for cycle #7 of FOLFIRI / BEVACIZUMAB today.   Overall, he tells me he has been feeling pretty well. He reports he is tolerating the treatment well and is able to of all of his typical daily activities. He denies abdominal pain and diarrhea. He has not required hydrocodone in the past week. He reports occasional mild constipation. He denies mouth sores.   Overall, he feels ready for next cycle of chemo today.    REVIEW OF SYSTEMS:  Review of Systems  Constitutional:  Negative for appetite change and fatigue.  HENT:    Negative for mouth sores.   Cardiovascular:  Positive for chest pain and palpitations.  Gastrointestinal:  Positive for constipation. Negative for abdominal pain and diarrhea.  All other systems reviewed and are negative.   PAST MEDICAL/SURGICAL HISTORY:  Past Medical History:  Diagnosis Date   Arthritis    Colon cancer ([facility])    colon   Hepatitis C    Hypertension    Port-A-Cath in place 02/15/2022   Past Surgical History:  Procedure Laterality Date   COLON SURGERY     PORTACATH PLACEMENT Right 02/08/2022   Procedure: INSERTION PORT-A-CATH- RIJ;  Surgeon: Rusty Aus, DO;  Location: [facility];  Service: General;  Laterality: Right;   REPLACEMENT TOTAL KNEE Left    SHOULDER ARTHROSCOPY Bilateral    TOTAL HIP ARTHROPLASTY Right 11/09/2020   Procedure: TOTAL HIP ARTHROPLASTY ANTERIOR APPROACH;  Surgeon: Renette Butters, MD;  Location: [facility];  Service: Orthopedics;  Laterality: Right;   WRIST SURGERY Left     SOCIAL HISTORY:  Social History   Socioeconomic History   Marital status: Married    Spouse name: Not on file   Number of children: 7   Years of education: Not on file   Highest education level: Not on file  Occupational History   Occupation: employed  Tobacco Use   Smoking status: Former    Packs/day: 1.50    Years: 20.00    Total pack years: 30.00    Types: Cigarettes  Quit date: 11/19/2005    Years since quitting: 16.5   Smokeless tobacco: Never  Vaping Use   Vaping Use: Never used  Substance and Sexual Activity   Alcohol use: Not Currently   Drug use: Never   Sexual activity: Yes  Other Topics Concern   Not on file  Social History Narrative   ** Merged History Encounter **       Separated from wife 11/2021   Social Determinants of Health   Financial Resource Strain: Low Risk  (10/07/2019)   Overall Financial Resource Strain (CARDIA)    Difficulty of Paying Living Expenses: Not very hard  Food Insecurity: No Food Insecurity  (10/07/2019)   Hunger Vital Sign    Worried About Running Out of Food in the Last Year: Never true    Ran Out of Food in the Last Year: Never true  Transportation Needs: No Transportation Needs (10/07/2019)   PRAPARE - Hydrologist (Medical): No    Lack of Transportation (Non-Medical): No  Physical Activity: Inactive (10/07/2019)   Exercise Vital Sign    Days of Exercise per Week: 0 days    Minutes of Exercise per Session: 0 min  Stress: No Stress Concern Present (10/07/2019)   [place]    Feeling of Stress : Not at all  Social Connections: Moderately Integrated (10/07/2019)   Social Connection and Isolation Panel [NHANES]    Frequency of Communication with Friends and Family: Once a week    Frequency of Social Gatherings with Friends and Family: Once a week    Attends Religious Services: More than 4 times per year    Active Member of [employer] or Organizations: Yes    Attends Music therapist: More than 4 times per year    Marital Status: Married  Human resources officer Violence: Not At Risk (10/07/2019)   Humiliation, Afraid, Rape, and Kick questionnaire    Fear of Current or Ex-Partner: No    Emotionally Abused: No    Physically Abused: No    Sexually Abused: No    FAMILY HISTORY:  Family History  Problem Relation Age of Onset   Heart disease Mother    Dementia Father    Heart disease Sister    Cancer Brother    Hypertension Brother    Hypertension Brother    Healthy Son    Healthy Son    Healthy Son    Healthy Son    Healthy Daughter    Healthy Daughter    Healthy Daughter     CURRENT MEDICATIONS:  Current Outpatient Medications  Medication Sig Dispense Refill   amLODipine (NORVASC) 10 MG tablet Take 1 tablet (10 mg total) by mouth daily. 90 tablet 3   Bevacizumab (AVASTIN IV) Inject into the vein every 14 (fourteen) days. *start date TBD     fluorouracil CALGB  80702 2,400 mg/m2 in sodium chloride 0.9 % 150 mL Inject 2,400 mg/m2 into the vein over 48 hr.     FLUOROURACIL IV Inject into the vein every 14 (fourteen) days.     HYDROcodone-acetaminophen (NORCO) 5-325 MG tablet Take 1 tablet by mouth every 8 (eight) hours as needed for moderate pain. 84 tablet 0   IRINOTECAN HCL IV Inject into the vein every 14 (fourteen) days.     Lactulose 20 GM/30ML SOLN Take 15 mLs (10 g total) by mouth at bedtime. Take 15 ml at bedtime every night to assist with regular bowel  movements.  Titrate down if having multiple bowel movements.  If a bowel movement has not occurred in 3 to 4 days or longer, then take 15 ml every 3 hours until a bowel movent has occurred. 450 mL 1   LEUCOVORIN CALCIUM IV Inject into the vein every 14 (fourteen) days.     lidocaine-prilocaine (EMLA) cream Apply a small amount to port a cath site (do not rub in) and cover with plastic wrap 1 hour prior to infusion appointments 30 g 3   prochlorperazine (COMPAZINE) 10 MG tablet Take 1 tablet (10 mg total) by mouth every 6 (six) hours as needed (NAUSEA). 30 tablet 1   traZODone (DESYREL) 50 MG tablet TAKE ONE TABLET BY MOUTH AT BEDTIME 30 tablet 0   vitamin B-12 (CYANOCOBALAMIN) 1000 MCG tablet Take 1 tablet (1,000 mcg total) by mouth daily. 30 tablet 6   No current facility-administered medications for this visit.    ALLERGIES:  No Known Allergies  PHYSICAL EXAM:  Performance status (ECOG): 0 - Asymptomatic  There were no vitals filed for this visit. Wt Readings from Last 3 Encounters:  05/09/22 181 lb (82.1 kg)  04/24/22 175 lb 4.8 oz (79.5 kg)  04/17/22 176 lb 9.6 oz (80.1 kg)   Physical Exam Vitals reviewed.  Constitutional:      Appearance: Normal appearance.  Cardiovascular:     Rate and Rhythm: Normal rate and regular rhythm.     Pulses: Normal pulses.     Heart sounds: Normal heart sounds.  Pulmonary:     Effort: Pulmonary effort is normal.     Breath sounds: Normal breath  sounds.  Neurological:     General: No focal deficit present.     Mental Status: He is alert and oriented to person, place, and time.  Psychiatric:        Mood and Affect: Mood normal.        Behavior: Behavior normal.     LABORATORY DATA:  I have reviewed the labs as listed.     Latest Ref Rng & Units 05/09/2022    7:57 AM 04/25/2022    9:03 AM 04/24/2022    8:05 AM  CBC  WBC 4.0 - 10.5 K/uL 15.6  7.9    7.7  3.5   Hemoglobin 13.0 - 17.0 g/dL 10.6  12.0    12.0  11.5   Hematocrit 39.0 - 52.0 % 33.3  37.2    37.1  35.6   Platelets 150 - 400 K/uL 147  185    184  197       Latest Ref Rng & Units 05/09/2022    7:57 AM 04/24/2022    8:05 AM 04/17/2022    8:06 AM  CMP  Glucose 70 - 99 mg/dL 93  84  100   BUN 8 - 23 mg/dL '8  9  10   ' Creatinine 0.61 - 1.24 mg/dL 0.87  0.86  1.02   Sodium 135 - 145 mmol/L 139  140  138   Potassium 3.5 - 5.1 mmol/L 3.4  3.6  3.5   Chloride 98 - 111 mmol/L 108  108  106   CO2 22 - 32 mmol/L '26  27  26   ' Calcium 8.9 - 10.3 mg/dL 8.8  9.0  8.6   Total Protein 6.5 - 8.1 g/dL 7.0  7.7  7.2   Total Bilirubin 0.3 - 1.2 mg/dL 0.1  0.6  0.8   Alkaline Phos 38 - 126 U/L 75  49  47   AST 15 - 41 U/L '16  18  15   ' ALT 0 - 44 U/L '7  7  7     ' DIAGNOSTIC IMAGING:  I have independently reviewed the scans and discussed with the patient. CT CHEST ABDOMEN PELVIS W CONTRAST  Result Date: 05/18/2022 CLINICAL DATA:  Metastatic colon cancer.  Restaging. * Tracking Code: BO * EXAM: CT CHEST, ABDOMEN, AND PELVIS WITH CONTRAST TECHNIQUE: Multidetector CT imaging of the chest, abdomen and pelvis was performed following the standard protocol during bolus administration of intravenous contrast. RADIATION DOSE REDUCTION: This exam was performed according to the departmental dose-optimization program which includes automated exposure control, adjustment of the mA and/or kV according to patient size and/or use of iterative reconstruction technique. CONTRAST:  59m OMNIPAQUE  IOHEXOL 300 MG/ML  SOLN COMPARISON:  12/16/2021 chest CT.  01/03/2022 CT abdomen/pelvis. FINDINGS: CT CHEST FINDINGS Cardiovascular: Normal heart size. Small anterior pericardial effusion is stable. Right internal jugular Port-A-Cath terminates in the upper third of the SVC. Minimally atherosclerotic nonaneurysmal thoracic aorta. Normal caliber pulmonary arteries. No central pulmonary emboli. Mediastinum/Nodes: No discrete thyroid nodules. Unremarkable esophagus. No axillary adenopathy. Enlarged 2.4 cm stone axis diameter right paratracheal node (series 2/image 29), decreased from 2.9 cm. Enlarged 1.8 cm subcarinal node (series 2/image 36), decreased from 2.5 cm. Enlarged 1.5 cm AP window node (series 2/image 34), decreased from 2.2 cm. Enlarged 2.4 cm right hilar node (series 2/image 33), mildly decreased from 2.6 cm. Enlarged 1.3 cm left hilar node (series 2/image 31), decreased from 1.9 cm. Lungs/Pleura: No pneumothorax. No pleural effusion. Numerous (greater than 20) solid pulmonary masses and nodules scattered throughout both lungs with mixed interval changes. Representative perihilar 7.3 x 4.9 cm left lower lobe lung mass (series 4/image 91), decreased from 8.1 x 7.5 cm. Representative posterior right upper lobe 4.1 x 3.0 cm lung mass (series 4/image 65), mildly increased from 3.9 x 2.6 cm. Representative anterior left upper lobe 2.9 x 2.2 cm pulmonary nodule (series 4/image 66), mildly increased from 2.5 x 2.2 cm. Representative subpleural 2.9 cm medial right lower lobe pulmonary nodule (series 4/image 120), previously 2.9 cm, stable. Representative basilar right lower lobe 2.0 cm pulmonary nodule (series 4/image 132), slightly increased from 1.8 cm. Musculoskeletal: No aggressive appearing focal osseous lesions. Moderate thoracic spondylosis. CT ABDOMEN PELVIS FINDINGS Hepatobiliary: Normal liver with no liver mass. Normal gallbladder with no radiopaque cholelithiasis. No biliary ductal dilatation. Pancreas:  Normal, with no mass or duct dilation. Spleen: Normal size. No mass. Adrenals/Urinary Tract: Normal right adrenal. Left adrenal 1.3 cm nodule with density 66 HU, decreased from 2.8 cm. Normal kidneys with no hydronephrosis and no renal mass. Nondistended bladder is obscured by streak artifact from right hip hardware. Grossly normal bladder. Stomach/Bowel: Normal non-distended stomach. Postsurgical changes from partial right hemicolectomy with intact appearing ileocolic anastomosis in the right abdomen. No dilated or thick-walled small bowel loops. Oral contrast transits to the distal colon. No large bowel wall thickening, diverticulosis or significant pericolonic fat stranding in the remnant large-bowel. Vascular/Lymphatic: Normal atherosclerotic nonaneurysmal abdominal aorta. Patent portal, splenic, hepatic and renal veins. No pathologically enlarged lymph nodes in the abdomen or pelvis. Reproductive: Normal size prostate. Other: No pneumoperitoneum, ascites or focal fluid collection. Musculoskeletal: No aggressive appearing focal osseous lesions. Marked lumbar spondylosis. Right total hip arthroplasty. IMPRESSION: 1. Mixed interval response to therapy, although overall mildly improved multisite metastatic disease. 2. Persistent numerous bilateral pulmonary metastases, some mildly decreased, some mildly increased and some stable in size. 3.  Mediastinal and bilateral hilar adenopathy is decreased. 4. Left adrenal metastasis is decreased. 5. Aortic Atherosclerosis (ICD10-I70.0). Electronically Signed   By: Ilona Sorrel M.D.   On: 05/18/2022 11:29     ASSESSMENT:  Left lung mass with multiple lung nodules: - Presentation with dry cough for 6 months. - 30 pound weight loss in the last couple of years, weight stable over the last 6 months. - CT chest with contrast on 12/16/2021 showed bulky left hilar mass measuring 8.1 x 7.5 cm.  Numerous bilateral lung nodules of varying sizes.  Left adrenal nodule measuring 2.8 x  2.2 cm.  Mediastinal and bilateral hilar adenopathy with the largest pretracheal node measuring 4.1 x 3.1 cm. - MRI of the brain from 01/04/2022 which was negative for metastatic disease. - CTAP from 01/03/2022 which showed isolated left adrenal metastasis with no other evidence of metastatic disease in the abdomen or pelvis. - Pathology of left lung biopsy which shows adenocarcinoma with necrosis.  CK20 positive and CDX2 positive but negative for CK7 indicating colonic primary. 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  HER2 negative.  TMB low.  MSI-stable.  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  Other targetable mutations negative. - FOLFIRI started on 02/22/2022, bevacizumab added with cycle 4   Social/family history: - Lives by himself.  He paints houses for living. - He quit smoking 12 years back and started back again 1 and half year ago and smoked half pack per day.  He quit again about 1 week ago. - Father had cancer, type unknown to the patient.  Brother died of brain tumor.  3.  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  Margins negative.  No lymphovascular or perineural invasion. - Received 3 cycles of XELOX followed by Xeloda for total of 6 months.  Oxaliplatin discontinued during cycle 4 secondary to transaminitis, elevated bilirubin and thrombocytopenia.  However he was also treated for hep C with Harvoni after that.   PLAN:  Metastatic colon cancer to the lungs and left adrenal gland: - He is not having any significant side effects with chemotherapy.  No bleeding issues with bevacizumab. - Last CEA has decreased to 11.6.  He is able to function well. - Reviewed CT CAP (05/17/2022): Mediastinal lymph nodes and hilar lymph nodes have decreased in size.  Largest perihilar left lower lobe lung mass has decreased in size.  Right upper lobe mass slightly increased in size.  Other lung nodules are stable.  Left adrenal mass  has decreased to 1.3 cm from 2.8 cm. - CT scan showed mixed response as it was compared to CT scan from 12/16/2021.  He did not start chemotherapy until 02/22/2022. - I have recommended continuing same regimen for 3 more months as it is not affecting his functional status.  He is agreeable.  We will proceed with treatment today and in 2 weeks.  RTC 4 weeks for follow-up with repeat CEA. - We will plan to repeat ferritin and iron panel to work-up for anemia.  2.  Lower rib/epigastric pain: - He had retrosternal throbbing pain after G-CSF injection.  Lately he is not having any pains.  He has not taken hydrocodone in the last week. - Continue hydrocodone 5 mg as needed.  3.  Normocytic anemia: - Continue B12 1 mg tablet daily.  4.  Difficulty falling asleep: - Continue trazodone 50 mg at bedtime which is helping.  5.  Hypertension: -  Continue Norvasc 10 mg daily.  Blood pressure is better controlled.   Orders placed this encounter:  No orders of the defined types were placed in this encounter.    Derek Jack, MD [place] [phone]   I, Thana Ates, am acting as a scribe for Dr. Derek Jack.  I, Derek Jack MD, have reviewed the above documentation for accuracy and completeness, and I agree with the above.

## 2022-05-23 NOTE — Progress Notes (Signed)
Patient has been examined by Dr. Katragadda, and vital signs and labs have been reviewed. ANC, Creatinine, LFTs, hemoglobin, and platelets are within treatment parameters per M.D. - pt may proceed with treatment.    °

## 2022-05-23 NOTE — Patient Instructions (Signed)
[facility]  Discharge Instructions: Thank you for choosing [facility] to provide your oncology and hematology care.  If you have a lab appointment with the [facility], please come in thru the Main Entrance and check in at the main information desk.  Wear comfortable clothing and clothing appropriate for easy access to any Portacath or PICC line.   We strive to give you quality time with your provider. You may need to reschedule your appointment if you arrive late (15 or more minutes).  Arriving late affects you and other patients whose appointments are after yours.  Also, if you miss three or more appointments without notifying the office, you may be dismissed from the clinic at the provider's discretion.      For prescription refill requests, have your pharmacy contact our office and allow 72 hours for refills to be completed.    Today you received the following chemotherapy and/or immunotherapy agents Bevacizumab, Leucovorin, Irinotecan, and 5FU injection and pump, return as scheduled.   To help prevent nausea and vomiting after your treatment, we encourage you to take your nausea medication as directed.  BELOW ARE SYMPTOMS THAT SHOULD BE REPORTED IMMEDIATELY: *FEVER GREATER THAN 100.4 F (38 C) OR HIGHER *CHILLS OR SWEATING *NAUSEA AND VOMITING THAT IS NOT CONTROLLED WITH YOUR NAUSEA MEDICATION *UNUSUAL SHORTNESS OF BREATH *UNUSUAL BRUISING OR BLEEDING *URINARY PROBLEMS (pain or burning when urinating, or frequent urination) *BOWEL PROBLEMS (unusual diarrhea, constipation, pain near the anus) TENDERNESS IN MOUTH AND THROAT WITH OR WITHOUT PRESENCE OF ULCERS (sore throat, sores in mouth, or a toothache) UNUSUAL RASH, SWELLING OR PAIN  UNUSUAL VAGINAL DISCHARGE OR ITCHING   Items with * indicate a potential emergency and should be followed up as soon as possible or go to the Emergency Department if any problems should occur.  Please show the CHEMOTHERAPY  ALERT CARD or IMMUNOTHERAPY ALERT CARD at check-in to the Emergency Department and triage nurse.  Should you have questions after your visit or need to cancel or reschedule your appointment, please contact [facility] [phone]  and follow the prompts.  Office hours are 8:00 a.m. to 4:30 p.m. Monday - Friday. Please note that voicemails left after 4:00 p.m. may not be returned until the following business day.  We are closed weekends and major holidays. You have access to a nurse at all times for urgent questions. Please call the main number to the clinic [phone] and follow the prompts.  For any non-urgent questions, you may also contact your provider using MyChart. We now offer e-Visits for anyone 59 and older to request care online for non-urgent symptoms. For details visit [email or web address].   Also download the MyChart app! Go to the app store, search "MyChart", open the app, select [facility], and log in with your MyChart username and password.  Masks are optional in the cancer centers. If you would like for your care team to wear a mask while they are taking care of you, please let them know. For doctor visits, patients may have with them one support person who is at least 65 years old. At this time, visitors are not allowed in the infusion area.

## 2022-05-23 NOTE — Progress Notes (Signed)
Patient presents today for treatment, patient and labs assessed by Dr. Delton Coombes, patient okay for treatment with an additional order for 40 mEq of PO potassium ordered per standing orders. Patient tolerated chemotherapy with no complaints voiced. Side effects with management reviewed understanding verbalized. Port site clean and dry with no bruising or swelling noted at site. Good blood return noted before and after administration of chemotherapy. Chemo pump connected with no alarms noted. Patient left in satisfactory condition with VSS and no s/s of distress noted.

## 2022-05-25 ENCOUNTER — Encounter (HOSPITAL_COMMUNITY): Payer: Self-pay

## 2022-05-25 ENCOUNTER — Inpatient Hospital Stay (HOSPITAL_COMMUNITY): Payer: 59

## 2022-05-25 VITALS — BP 131/83 | HR 63 | Temp 96.7°F | Resp 18

## 2022-05-25 DIAGNOSIS — Z5112 Encounter for antineoplastic immunotherapy: Secondary | ICD-10-CM | POA: Diagnosis not present

## 2022-05-25 DIAGNOSIS — C180 Malignant neoplasm of cecum: Secondary | ICD-10-CM

## 2022-05-25 DIAGNOSIS — Z95828 Presence of other vascular implants and grafts: Secondary | ICD-10-CM

## 2022-05-25 MED ORDER — HEPARIN SOD (PORK) LOCK FLUSH 100 UNIT/ML IV SOLN
500.0000 [IU] | Freq: Once | INTRAVENOUS | Status: AC | PRN
  Administered 2022-05-25: 500 [IU]

## 2022-05-25 MED ORDER — PEGFILGRASTIM-CBQV 6 MG/0.6ML ~~LOC~~ SOSY
6.0000 mg | PREFILLED_SYRINGE | Freq: Once | SUBCUTANEOUS | Status: AC
  Administered 2022-05-25: 6 mg via SUBCUTANEOUS
  Filled 2022-05-25: qty 0.6

## 2022-05-25 MED ORDER — SODIUM CHLORIDE 0.9% FLUSH
10.0000 mL | INTRAVENOUS | Status: DC | PRN
  Administered 2022-05-25: 10 mL

## 2022-05-25 NOTE — Patient Instructions (Signed)
[facility]  Discharge Instructions: Thank you for choosing [facility] to provide your oncology and hematology care.  If you have a lab appointment with the [facility], please come in thru the Main Entrance and check in at the main information desk.  Wear comfortable clothing and clothing appropriate for easy access to any Portacath or PICC line.   We strive to give you quality time with your provider. You may need to reschedule your appointment if you arrive late (15 or more minutes).  Arriving late affects you and other patients whose appointments are after yours.  Also, if you miss three or more appointments without notifying the office, you may be dismissed from the clinic at the provider's discretion.      For prescription refill requests, have your pharmacy contact our office and allow 72 hours for refills to be completed.    Today you received the following udenyca injection and pump d/c, return as scheduled.   To help prevent nausea and vomiting after your treatment, we encourage you to take your nausea medication as directed.  BELOW ARE SYMPTOMS THAT SHOULD BE REPORTED IMMEDIATELY: *FEVER GREATER THAN 100.4 F (38 C) OR HIGHER *CHILLS OR SWEATING *NAUSEA AND VOMITING THAT IS NOT CONTROLLED WITH YOUR NAUSEA MEDICATION *UNUSUAL SHORTNESS OF BREATH *UNUSUAL BRUISING OR BLEEDING *URINARY PROBLEMS (pain or burning when urinating, or frequent urination) *BOWEL PROBLEMS (unusual diarrhea, constipation, pain near the anus) TENDERNESS IN MOUTH AND THROAT WITH OR WITHOUT PRESENCE OF ULCERS (sore throat, sores in mouth, or a toothache) UNUSUAL RASH, SWELLING OR PAIN  UNUSUAL VAGINAL DISCHARGE OR ITCHING   Items with * indicate a potential emergency and should be followed up as soon as possible or go to the Emergency Department if any problems should occur.  Please show the CHEMOTHERAPY ALERT CARD or IMMUNOTHERAPY ALERT CARD at check-in to the Emergency  Department and triage nurse.  Should you have questions after your visit or need to cancel or reschedule your appointment, please contact [facility] [phone]  and follow the prompts.  Office hours are 8:00 a.m. to 4:30 p.m. Monday - Friday. Please note that voicemails left after 4:00 p.m. may not be returned until the following business day.  We are closed weekends and major holidays. You have access to a nurse at all times for urgent questions. Please call the main number to the clinic [phone] and follow the prompts.  For any non-urgent questions, you may also contact your provider using MyChart. We now offer e-Visits for anyone 65 and older to request care online for non-urgent symptoms. For details visit [email or web address].   Also download the MyChart app! Go to the app store, search "MyChart", open the app, select [facility], and log in with your MyChart username and password.  Masks are optional in the cancer centers. If you would like for your care team to wear a mask while they are taking care of you, please let them know. For doctor visits, patients may have with them one support person who is at least 65 years old. At this time, visitors are not allowed in the infusion area.

## 2022-05-25 NOTE — Progress Notes (Signed)
Patient presents today for pump d/c and udenyca injection. Port flushed with good blood return noted. No bruising or swelling at site. Bandaid applied and patient discharged in satisfactory condition.  Patient tolerated injection with no complaints voiced. Site clean and dry with no bruising or swelling noted at site. See MAR for details. Band aid applied.  Patient stable during and after injection. VSS with discharge and left in satisfactory condition with no s/s of distress noted.

## 2022-06-06 ENCOUNTER — Inpatient Hospital Stay (HOSPITAL_COMMUNITY): Payer: 59

## 2022-06-06 ENCOUNTER — Encounter (HOSPITAL_COMMUNITY): Payer: Self-pay

## 2022-06-06 VITALS — BP 120/83 | HR 56 | Temp 97.6°F | Resp 18

## 2022-06-06 DIAGNOSIS — D649 Anemia, unspecified: Secondary | ICD-10-CM

## 2022-06-06 DIAGNOSIS — C180 Malignant neoplasm of cecum: Secondary | ICD-10-CM

## 2022-06-06 DIAGNOSIS — Z95828 Presence of other vascular implants and grafts: Secondary | ICD-10-CM

## 2022-06-06 DIAGNOSIS — Z5112 Encounter for antineoplastic immunotherapy: Secondary | ICD-10-CM | POA: Diagnosis not present

## 2022-06-06 DIAGNOSIS — C189 Malignant neoplasm of colon, unspecified: Secondary | ICD-10-CM

## 2022-06-06 LAB — CBC WITH DIFFERENTIAL/PLATELET
Abs Immature Granulocytes: 1.2 10*3/uL — ABNORMAL HIGH (ref 0.00–0.07)
Basophils Absolute: 0 10*3/uL (ref 0.0–0.1)
Basophils Relative: 0 %
Eosinophils Absolute: 0.2 10*3/uL (ref 0.0–0.5)
Eosinophils Relative: 2 %
HCT: 30.3 % — ABNORMAL LOW (ref 39.0–52.0)
Hemoglobin: 9.8 g/dL — ABNORMAL LOW (ref 13.0–17.0)
Lymphocytes Relative: 22 %
Lymphs Abs: 2.5 10*3/uL (ref 0.7–4.0)
MCH: 30.7 pg (ref 26.0–34.0)
MCHC: 32.3 g/dL (ref 30.0–36.0)
MCV: 95 fL (ref 80.0–100.0)
Metamyelocytes Relative: 6 %
Monocytes Absolute: 1 10*3/uL (ref 0.1–1.0)
Monocytes Relative: 9 %
Myelocytes: 4 %
Neutro Abs: 6.3 10*3/uL (ref 1.7–7.7)
Neutrophils Relative %: 56 %
Platelets: 175 10*3/uL (ref 150–400)
Promyelocytes Relative: 1 %
RBC: 3.19 MIL/uL — ABNORMAL LOW (ref 4.22–5.81)
RDW: 20.5 % — ABNORMAL HIGH (ref 11.5–15.5)
WBC: 11.2 10*3/uL — ABNORMAL HIGH (ref 4.0–10.5)
nRBC: 0.4 % — ABNORMAL HIGH (ref 0.0–0.2)

## 2022-06-06 LAB — COMPREHENSIVE METABOLIC PANEL
ALT: 7 U/L (ref 0–44)
AST: 12 U/L — ABNORMAL LOW (ref 15–41)
Albumin: 3.3 g/dL — ABNORMAL LOW (ref 3.5–5.0)
Alkaline Phosphatase: 73 U/L (ref 38–126)
Anion gap: 5 (ref 5–15)
BUN: 11 mg/dL (ref 8–23)
CO2: 26 mmol/L (ref 22–32)
Calcium: 8.4 mg/dL — ABNORMAL LOW (ref 8.9–10.3)
Chloride: 107 mmol/L (ref 98–111)
Creatinine, Ser: 1.14 mg/dL (ref 0.61–1.24)
GFR, Estimated: >60 mL/min (ref >60)
Glucose, Bld: 97 mg/dL (ref 70–99)
Potassium: 3.5 mmol/L (ref 3.5–5.1)
Sodium: 138 mmol/L (ref 135–145)
Total Bilirubin: 0.4 mg/dL (ref 0.3–1.2)
Total Protein: 6.7 g/dL (ref 6.5–8.1)

## 2022-06-06 LAB — IRON AND TIBC
Iron: 70 ug/dL (ref 45–182)
Saturation Ratios: 24 % (ref 17.9–39.5)
TIBC: 294 ug/dL (ref 250–450)
UIBC: 224 ug/dL

## 2022-06-06 LAB — FERRITIN: Ferritin: 243 ng/mL (ref 24–336)

## 2022-06-06 LAB — MAGNESIUM: Magnesium: 1.9 mg/dL (ref 1.7–2.4)

## 2022-06-06 MED ORDER — FLUOROURACIL CHEMO INJECTION 2.5 GM/50ML
400.0000 mg/m2 | Freq: Once | INTRAVENOUS | Status: AC
  Administered 2022-06-06: 800 mg via INTRAVENOUS
  Filled 2022-06-06: qty 16

## 2022-06-06 MED ORDER — SODIUM CHLORIDE 0.9 % IV SOLN
180.0000 mg/m2 | Freq: Once | INTRAVENOUS | Status: AC
  Administered 2022-06-06: 360 mg via INTRAVENOUS
  Filled 2022-06-06: qty 15

## 2022-06-06 MED ORDER — PALONOSETRON HCL INJECTION 0.25 MG/5ML
0.2500 mg | Freq: Once | INTRAVENOUS | Status: AC
  Administered 2022-06-06: 0.25 mg via INTRAVENOUS
  Filled 2022-06-06: qty 5

## 2022-06-06 MED ORDER — SODIUM CHLORIDE 0.9 % IV SOLN
5.0000 mg/kg | Freq: Once | INTRAVENOUS | Status: AC
  Administered 2022-06-06: 400 mg via INTRAVENOUS
  Filled 2022-06-06: qty 16

## 2022-06-06 MED ORDER — SODIUM CHLORIDE 0.9 % IV SOLN: Freq: Once | INTRAVENOUS | Status: AC

## 2022-06-06 MED ORDER — SODIUM CHLORIDE 0.9 % IV SOLN
5000.0000 mg | INTRAVENOUS | Status: DC
  Administered 2022-06-06: 5000 mg via INTRAVENOUS
  Filled 2022-06-06: qty 100

## 2022-06-06 MED ORDER — SODIUM CHLORIDE 0.9 % IV SOLN
10.0000 mg | Freq: Once | INTRAVENOUS | Status: AC
  Administered 2022-06-06: 10 mg via INTRAVENOUS
  Filled 2022-06-06: qty 10

## 2022-06-06 MED ORDER — SODIUM CHLORIDE 0.9 % IV SOLN
400.0000 mg/m2 | Freq: Once | INTRAVENOUS | Status: AC
  Administered 2022-06-06: 792 mg via INTRAVENOUS
  Filled 2022-06-06: qty 39.6

## 2022-06-06 MED ORDER — ATROPINE SULFATE 1 MG/ML IV SOLN
0.5000 mg | Freq: Once | INTRAVENOUS | Status: AC
  Administered 2022-06-06: 0.5 mg via INTRAVENOUS
  Filled 2022-06-06: qty 1

## 2022-06-08 ENCOUNTER — Inpatient Hospital Stay (HOSPITAL_COMMUNITY): Payer: 59

## 2022-06-08 ENCOUNTER — Encounter (HOSPITAL_COMMUNITY): Payer: Self-pay

## 2022-06-08 VITALS — BP 128/79 | HR 70 | Temp 97.8°F | Resp 18

## 2022-06-08 DIAGNOSIS — C180 Malignant neoplasm of cecum: Secondary | ICD-10-CM

## 2022-06-08 DIAGNOSIS — Z5112 Encounter for antineoplastic immunotherapy: Secondary | ICD-10-CM | POA: Diagnosis not present

## 2022-06-08 DIAGNOSIS — Z95828 Presence of other vascular implants and grafts: Secondary | ICD-10-CM

## 2022-06-08 LAB — CEA: CEA: 11.5 ng/mL — ABNORMAL HIGH (ref 0.0–4.7)

## 2022-06-08 MED ORDER — HEPARIN SOD (PORK) LOCK FLUSH 100 UNIT/ML IV SOLN
500.0000 [IU] | Freq: Once | INTRAVENOUS | Status: AC | PRN
  Administered 2022-06-08: 500 [IU]

## 2022-06-08 MED ORDER — SODIUM CHLORIDE 0.9% FLUSH
10.0000 mL | INTRAVENOUS | Status: DC | PRN
  Administered 2022-06-08: 10 mL

## 2022-06-08 MED ORDER — PEGFILGRASTIM-CBQV 6 MG/0.6ML ~~LOC~~ SOSY
6.0000 mg | PREFILLED_SYRINGE | Freq: Once | SUBCUTANEOUS | Status: AC
  Administered 2022-06-08: 6 mg via SUBCUTANEOUS
  Filled 2022-06-08: qty 0.6

## 2022-06-08 NOTE — Patient Instructions (Signed)
[facility]  Discharge Instructions: Thank you for choosing [facility] to provide your oncology and hematology care.  If you have a lab appointment with the Cancer Center, please come in thru the Main Entrance and check in at the main information desk.  Wear comfortable clothing and clothing appropriate for easy access to any Portacath or PICC line.   We strive to give you quality time with your provider. You may need to reschedule your appointment if you arrive late (15 or more minutes).  Arriving late affects you and other patients whose appointments are after yours.  Also, if you miss three or more appointments without notifying the office, you may be dismissed from the clinic at the provider's discretion.      For prescription refill requests, have your pharmacy contact our office and allow 72 hours for refills to be completed.         To help prevent nausea and vomiting after your treatment, we encourage you to take your nausea medication as directed.  BELOW ARE SYMPTOMS THAT SHOULD BE REPORTED IMMEDIATELY: *FEVER GREATER THAN 100.4 F (38 C) OR HIGHER *CHILLS OR SWEATING *NAUSEA AND VOMITING THAT IS NOT CONTROLLED WITH YOUR NAUSEA MEDICATION *UNUSUAL SHORTNESS OF BREATH *UNUSUAL BRUISING OR BLEEDING *URINARY PROBLEMS (pain or burning when urinating, or frequent urination) *BOWEL PROBLEMS (unusual diarrhea, constipation, pain near the anus) TENDERNESS IN MOUTH AND THROAT WITH OR WITHOUT PRESENCE OF ULCERS (sore throat, sores in mouth, or a toothache) UNUSUAL RASH, SWELLING OR PAIN  UNUSUAL VAGINAL DISCHARGE OR ITCHING   Items with * indicate a potential emergency and should be followed up as soon as possible or go to the Emergency Department if any problems should occur.  Please show the CHEMOTHERAPY ALERT CARD or IMMUNOTHERAPY ALERT CARD at check-in to the Emergency Department and triage nurse.  Should you have questions after your visit or need to  cancel or reschedule your appointment, please contact [facility] [phone]  and follow the prompts.  Office hours are 8:00 a.m. to 4:30 p.m. Monday - Friday. Please note that voicemails left after 4:00 p.m. may not be returned until the following business day.  We are closed weekends and major holidays. You have access to a nurse at all times for urgent questions. Please call the main number to the clinic [phone] and follow the prompts.  For any non-urgent questions, you may also contact your provider using MyChart. We now offer e-Visits for anyone 18 and older to request care online for non-urgent symptoms. For details visit [email or web address].   Also download the MyChart app! Go to the app store, search "MyChart", open the app, select [facility], and log in with your MyChart username and password.  Masks are optional in the cancer centers. If you would like for your care team to wear a mask while they are taking care of you, please let them know. For doctor visits, patients may have with them one support person who is at least 65 years old. At this time, visitors are not allowed in the infusion area.  

## 2022-06-08 NOTE — Progress Notes (Signed)
Chemotherapy pump disconnected with no complaints voiced.  Patients port flushed without difficulty.  Good blood return noted with no bruising or swelling noted at site.  Band aid applied.  VSS with discharge and left in satisfactory condition with no s/s of distress noted.

## 2022-06-20 ENCOUNTER — Inpatient Hospital Stay (HOSPITAL_COMMUNITY): Payer: 59 | Admitting: Hematology

## 2022-06-20 ENCOUNTER — Inpatient Hospital Stay (HOSPITAL_COMMUNITY): Payer: 59

## 2022-06-22 ENCOUNTER — Inpatient Hospital Stay (HOSPITAL_COMMUNITY): Payer: 59

## 2022-06-27 ENCOUNTER — Inpatient Hospital Stay (HOSPITAL_COMMUNITY): Payer: 59

## 2022-06-27 ENCOUNTER — Inpatient Hospital Stay (HOSPITAL_COMMUNITY): Payer: 59 | Attending: Hematology

## 2022-06-27 ENCOUNTER — Other Ambulatory Visit (HOSPITAL_COMMUNITY): Payer: Self-pay | Admitting: *Deleted

## 2022-06-27 ENCOUNTER — Inpatient Hospital Stay (HOSPITAL_BASED_OUTPATIENT_CLINIC_OR_DEPARTMENT_OTHER): Payer: 59 | Admitting: Hematology

## 2022-06-27 VITALS — BP 147/96 | HR 55 | Temp 97.7°F | Resp 18

## 2022-06-27 DIAGNOSIS — I10 Essential (primary) hypertension: Secondary | ICD-10-CM | POA: Insufficient documentation

## 2022-06-27 DIAGNOSIS — D649 Anemia, unspecified: Secondary | ICD-10-CM | POA: Diagnosis not present

## 2022-06-27 DIAGNOSIS — C7972 Secondary malignant neoplasm of left adrenal gland: Secondary | ICD-10-CM | POA: Insufficient documentation

## 2022-06-27 DIAGNOSIS — Z87891 Personal history of nicotine dependence: Secondary | ICD-10-CM | POA: Insufficient documentation

## 2022-06-27 DIAGNOSIS — Z5111 Encounter for antineoplastic chemotherapy: Secondary | ICD-10-CM | POA: Insufficient documentation

## 2022-06-27 DIAGNOSIS — C180 Malignant neoplasm of cecum: Secondary | ICD-10-CM | POA: Diagnosis present

## 2022-06-27 DIAGNOSIS — C189 Malignant neoplasm of colon, unspecified: Secondary | ICD-10-CM | POA: Diagnosis not present

## 2022-06-27 DIAGNOSIS — Z5112 Encounter for antineoplastic immunotherapy: Secondary | ICD-10-CM | POA: Insufficient documentation

## 2022-06-27 DIAGNOSIS — C7800 Secondary malignant neoplasm of unspecified lung: Secondary | ICD-10-CM | POA: Diagnosis not present

## 2022-06-27 DIAGNOSIS — Z5189 Encounter for other specified aftercare: Secondary | ICD-10-CM | POA: Diagnosis not present

## 2022-06-27 DIAGNOSIS — Z95828 Presence of other vascular implants and grafts: Secondary | ICD-10-CM

## 2022-06-27 DIAGNOSIS — Z79899 Other long term (current) drug therapy: Secondary | ICD-10-CM | POA: Diagnosis not present

## 2022-06-27 LAB — CBC WITH DIFFERENTIAL/PLATELET
Abs Immature Granulocytes: 0.31 10*3/uL — ABNORMAL HIGH (ref 0.00–0.07)
Basophils Absolute: 0.1 10*3/uL (ref 0.0–0.1)
Basophils Relative: 1 %
Eosinophils Absolute: 0.2 10*3/uL (ref 0.0–0.5)
Eosinophils Relative: 2 %
HCT: 34.4 % — ABNORMAL LOW (ref 39.0–52.0)
Hemoglobin: 11.1 g/dL — ABNORMAL LOW (ref 13.0–17.0)
Immature Granulocytes: 4 %
Lymphocytes Relative: 25 %
Lymphs Abs: 2.2 10*3/uL (ref 0.7–4.0)
MCH: 30 pg (ref 26.0–34.0)
MCHC: 32.3 g/dL (ref 30.0–36.0)
MCV: 93 fL (ref 80.0–100.0)
Monocytes Absolute: 0.8 10*3/uL (ref 0.1–1.0)
Monocytes Relative: 10 %
Neutro Abs: 5 10*3/uL (ref 1.7–7.7)
Neutrophils Relative %: 58 %
Platelets: 226 10*3/uL (ref 150–400)
RBC: 3.7 MIL/uL — ABNORMAL LOW (ref 4.22–5.81)
RDW: 18.3 % — ABNORMAL HIGH (ref 11.5–15.5)
WBC: 8.7 10*3/uL (ref 4.0–10.5)
nRBC: 0 % (ref 0.0–0.2)

## 2022-06-27 LAB — COMPREHENSIVE METABOLIC PANEL
ALT: 8 U/L (ref 0–44)
AST: 15 U/L (ref 15–41)
Albumin: 3.8 g/dL (ref 3.5–5.0)
Alkaline Phosphatase: 63 U/L (ref 38–126)
Anion gap: 7 (ref 5–15)
BUN: 10 mg/dL (ref 8–23)
CO2: 26 mmol/L (ref 22–32)
Calcium: 8.9 mg/dL (ref 8.9–10.3)
Chloride: 105 mmol/L (ref 98–111)
Creatinine, Ser: 0.95 mg/dL (ref 0.61–1.24)
GFR, Estimated: >60 mL/min (ref >60)
Glucose, Bld: 99 mg/dL (ref 70–99)
Potassium: 3.6 mmol/L (ref 3.5–5.1)
Sodium: 138 mmol/L (ref 135–145)
Total Bilirubin: 0.6 mg/dL (ref 0.3–1.2)
Total Protein: 7.7 g/dL (ref 6.5–8.1)

## 2022-06-27 LAB — URINALYSIS, DIPSTICK ONLY
Bilirubin Urine: NEGATIVE
Glucose, UA: NEGATIVE mg/dL
Ketones, ur: NEGATIVE mg/dL
Leukocytes,Ua: NEGATIVE
Nitrite: NEGATIVE
Protein, ur: NEGATIVE mg/dL
Specific Gravity, Urine: 1.016 (ref 1.005–1.030)
pH: 5 (ref 5.0–8.0)

## 2022-06-27 LAB — MAGNESIUM: Magnesium: 2 mg/dL (ref 1.7–2.4)

## 2022-06-27 MED ORDER — FLUOROURACIL CHEMO INJECTION 2.5 GM/50ML
400.0000 mg/m2 | Freq: Once | INTRAVENOUS | Status: AC
  Administered 2022-06-27: 800 mg via INTRAVENOUS
  Filled 2022-06-27: qty 16

## 2022-06-27 MED ORDER — ALUMINUM-MAGNESIUM-SIMETHICONE 200-200-20 MG/5ML PO SUSP: 30.0000 mL | Freq: Three times a day (TID) | ORAL | 2 refills | Status: DC

## 2022-06-27 MED ORDER — ATROPINE SULFATE 1 MG/ML IV SOLN
0.5000 mg | Freq: Once | INTRAVENOUS | Status: AC
  Administered 2022-06-27: 0.5 mg via INTRAVENOUS
  Filled 2022-06-27: qty 1

## 2022-06-27 MED ORDER — SODIUM CHLORIDE 0.9 % IV SOLN
10.0000 mg | Freq: Once | INTRAVENOUS | Status: AC
  Administered 2022-06-27: 10 mg via INTRAVENOUS
  Filled 2022-06-27: qty 10

## 2022-06-27 MED ORDER — SODIUM CHLORIDE 0.9 % IV SOLN: Freq: Once | INTRAVENOUS | Status: AC

## 2022-06-27 MED ORDER — HYDROCODONE-ACETAMINOPHEN 5-325 MG PO TABS: 1.0000 | ORAL_TABLET | Freq: Three times a day (TID) | ORAL | 0 refills | Status: DC | PRN

## 2022-06-27 MED ORDER — HEPARIN SOD (PORK) LOCK FLUSH 100 UNIT/ML IV SOLN: 500.0000 [IU] | Freq: Once | INTRAVENOUS | Status: DC | PRN

## 2022-06-27 MED ORDER — SODIUM CHLORIDE 0.9 % IV SOLN
5000.0000 mg | INTRAVENOUS | Status: DC
  Administered 2022-06-27: 5000 mg via INTRAVENOUS
  Filled 2022-06-27: qty 100

## 2022-06-27 MED ORDER — PALONOSETRON HCL INJECTION 0.25 MG/5ML
0.2500 mg | Freq: Once | INTRAVENOUS | Status: AC
  Administered 2022-06-27: 0.25 mg via INTRAVENOUS
  Filled 2022-06-27: qty 5

## 2022-06-27 MED ORDER — SODIUM CHLORIDE 0.9 % IV SOLN
5.0000 mg/kg | Freq: Once | INTRAVENOUS | Status: AC
  Administered 2022-06-27: 400 mg via INTRAVENOUS
  Filled 2022-06-27: qty 16

## 2022-06-27 MED ORDER — SODIUM CHLORIDE 0.9% FLUSH
10.0000 mL | INTRAVENOUS | Status: DC | PRN
  Administered 2022-06-27: 10 mL

## 2022-06-27 MED ORDER — SODIUM CHLORIDE 0.9 % IV SOLN
400.0000 mg/m2 | Freq: Once | INTRAVENOUS | Status: AC
  Administered 2022-06-27: 792 mg via INTRAVENOUS
  Filled 2022-06-27: qty 39.6

## 2022-06-27 MED ORDER — MEGESTROL ACETATE 400 MG/10ML PO SUSP: 400.0000 mg | Freq: Two times a day (BID) | ORAL | 3 refills | Status: DC

## 2022-06-27 MED ORDER — SODIUM CHLORIDE 0.9 % IV SOLN
180.0000 mg/m2 | Freq: Once | INTRAVENOUS | Status: AC
  Administered 2022-06-27: 360 mg via INTRAVENOUS
  Filled 2022-06-27: qty 3

## 2022-06-27 NOTE — Progress Notes (Signed)
Patient tolerated chemotherapy with no complaints voiced. Side effects with management reviewed understanding verbalized. Port site clean and dry with no bruising or swelling noted at site. Good blood return noted before and after administration of chemotherapy. Chemo pump connected with no alarms noted. Patient left in satisfactory condition with VSS and no s/s of distress noted.  °

## 2022-06-27 NOTE — Progress Notes (Signed)
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  HER2 negative.  TMB low.  MSI-stable.  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  Other targetable mutations negative.  CURRENT THERAPY: K-ras G12 D mutation.  HER2 negative.  TMB low.  MSI-stable.  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  Other targetable mutations negative.  BRIEF ONCOLOGIC HISTORY:  Oncology History  Cecal cancer ([place])  10/07/2019 Initial Diagnosis   Cecal cancer ([place])   10/07/2019 Cancer Staging   Staging form: Colon and Rectum, AJCC 8th Edition - Clinical stage from 10/07/2019: Stage IIIB (cT3, cN1, cM0) - Signed by Derek Jack, MD on 10/07/2019   02/22/2022 -  Chemotherapy   Patient is on Treatment Plan : COLORECTAL FOLFIRI / BEVACIZUMAB Q14D       CANCER STAGING:  Cancer Staging  Cecal cancer ([place]) Staging form: Colon and Rectum, AJCC 8th Edition - Clinical stage from 10/07/2019: Stage IIIB (cT3, cN1, cM0) - Signed by Derek Jack, MD on 10/07/2019 - Pathologic stage from 01/23/2022: Stage IVB (rpTX, pN0, pM1b) - Unsigned   INTERVAL HISTORY:  Mr. DAMAURI MINION, a 66 y.o. male, returns for routine follow-up and consideration for next cycle of chemotherapy. Joesiah was last seen on 05/23/2022.  Due for cycle #9 of FOLFIRI / BEVACIZUMAB today.   Overall, he tells me he has been feeling pretty well. He is taking 3-5 Norco tablets daily for throbbing chest pain which occurs intermittently for 2 weeks following his injection. He also reports soreness in the inside of his upper lip. He uses Biotene mouthwash. He denies abdominal cramping and diarrhea. His appetite is poor.  He has lost 8 lbs since his last visit.    Overall, he feels ready for next cycle of chemo today.    REVIEW OF SYSTEMS:  Review of Systems  Constitutional:  Positive for appetite change and unexpected weight change (-8 lbs).  Cardiovascular:  Positive for chest pain and palpitations.  Gastrointestinal:  Positive for constipation. Negative for abdominal pain and diarrhea.  Neurological:  Positive for headaches.  All other systems reviewed and are negative.   PAST MEDICAL/SURGICAL HISTORY:  Past Medical History:  Diagnosis Date   Arthritis    Colon cancer ([place])    colon   Hepatitis C    Hypertension    Port-A-Cath in place 02/15/2022   Past Surgical History:  Procedure Laterality Date   COLON SURGERY     PORTACATH PLACEMENT Right 02/08/2022   Procedure: INSERTION PORT-A-CATH- RIJ;  Surgeon: Rusty Aus, DO;  Location: [facility];  Service: General;  Laterality: Right;   REPLACEMENT TOTAL KNEE Left    SHOULDER ARTHROSCOPY Bilateral    TOTAL HIP ARTHROPLASTY Right 11/09/2020   Procedure: TOTAL HIP ARTHROPLASTY ANTERIOR APPROACH;  Surgeon: Renette Butters, MD;  Location: [facility];  Service: Orthopedics;  Laterality: Right;   WRIST SURGERY Left     SOCIAL HISTORY:  Social History   Socioeconomic History   Marital status: Married    Spouse name: Not on file   Number of children: 7   Years of education: Not on file   Highest education level: Not on file  Occupational History   Occupation: employed  Tobacco Use   Smoking status: Former    Packs/day: 1.50    Years: 20.00    Total pack years: 30.00    Types: Cigarettes    Quit date: 11/19/2005    Years since quitting: 16.6   Smokeless tobacco: Never  Vaping Use   Vaping Use: Never used  Substance and Sexual Activity   Alcohol use: Not Currently   Drug use: Never   Sexual activity: Yes  Other Topics Concern   Not on file  Social History Narrative   ** Merged History Encounter **       Separated from wife 11/2021   Social Determinants of Health    Financial Resource Strain: Low Risk  (10/07/2019)   Overall Financial Resource Strain (CARDIA)    Difficulty of Paying Living Expenses: Not very hard  Food Insecurity: No Food Insecurity (10/07/2019)   Hunger Vital Sign    Worried About Running Out of Food in the Last Year: Never true    Ran Out of Food in the Last Year: Never true  Transportation Needs: No Transportation Needs (10/07/2019)   PRAPARE - Hydrologist (Medical): No    Lack of Transportation (Non-Medical): No  Physical Activity: Inactive (10/07/2019)   Exercise Vital Sign    Days of Exercise per Week: 0 days    Minutes of Exercise per Session: 0 min  Stress: No Stress Concern Present (10/07/2019)   [place]    Feeling of Stress : Not at all  Social Connections: Moderately Integrated (10/07/2019)   Social Connection and Isolation Panel [NHANES]    Frequency of Communication with Friends and Family: Once a week    Frequency of Social Gatherings with Friends and Family: Once a week    Attends Religious Services: More than 4 times per year    Active Member of [employer] or Organizations: Yes    Attends Music therapist: More than 4 times per year    Marital Status: Married  Human resources officer Violence: Not At Risk (10/07/2019)   Humiliation, Afraid, Rape, and Kick questionnaire    Fear of Current or Ex-Partner: No    Emotionally Abused: No    Physically Abused: No    Sexually Abused: No    FAMILY HISTORY:  Family History  Problem Relation Age of Onset   Heart disease Mother    Dementia Father    Heart disease Sister    Cancer Brother    Hypertension Brother    Hypertension Brother    Healthy Son    Healthy Son    Healthy Son    Healthy Son    Healthy Daughter    Healthy Daughter    Healthy Daughter     CURRENT MEDICATIONS:  Current Outpatient Medications  Medication Sig Dispense Refill   amLODipine  (NORVASC) 10 MG tablet Take 1 tablet (10 mg total) by mouth daily. 90 tablet 3   Bevacizumab (AVASTIN IV) Inject into the vein every 14 (fourteen) days. *start date TBD     fluorouracil CALGB 85462 2,400 mg/m2 in sodium chloride 0.9 % 150 mL Inject 2,400 mg/m2 into the vein over 48 hr.     FLUOROURACIL IV Inject into the vein every 14 (fourteen) days.     HYDROcodone-acetaminophen (NORCO) 5-325 MG tablet Take 1 tablet by mouth every 8 (eight) hours as needed for moderate pain. 84 tablet 0   IRINOTECAN HCL IV Inject into the vein every 14 (  fourteen) days.     Lactulose 20 GM/30ML SOLN Take 15 mLs (10 g total) by mouth at bedtime. Take 15 ml at bedtime every night to assist with regular bowel movements.  Titrate down if having multiple bowel movements.  If a bowel movement has not occurred in 3 to 4 days or longer, then take 15 ml every 3 hours until a bowel movent has occurred. 450 mL 1   LEUCOVORIN CALCIUM IV Inject into the vein every 14 (fourteen) days.     lidocaine-prilocaine (EMLA) cream Apply a small amount to port a cath site (do not rub in) and cover with plastic wrap 1 hour prior to infusion appointments 30 g 3   prochlorperazine (COMPAZINE) 10 MG tablet Take 1 tablet (10 mg total) by mouth every 6 (six) hours as needed (NAUSEA). 30 tablet 1   traZODone (DESYREL) 50 MG tablet TAKE ONE TABLET BY MOUTH AT BEDTIME 30 tablet 0   vitamin B-12 (CYANOCOBALAMIN) 1000 MCG tablet Take 1 tablet (1,000 mcg total) by mouth daily. 30 tablet 6   No current facility-administered medications for this visit.    ALLERGIES:  No Known Allergies  PHYSICAL EXAM:  Performance status (ECOG): 0 - Asymptomatic  There were no vitals filed for this visit. Wt Readings from Last 3 Encounters:  06/06/22 180 lb 9.6 oz (81.9 kg)  05/23/22 180 lb (81.6 kg)  05/09/22 181 lb (82.1 kg)   Physical Exam Vitals reviewed.  Constitutional:      Appearance: Normal appearance.  Cardiovascular:     Rate and Rhythm:  Normal rate and regular rhythm.     Pulses: Normal pulses.     Heart sounds: Normal heart sounds.  Pulmonary:     Effort: Pulmonary effort is normal.     Breath sounds: Normal breath sounds.  Neurological:     General: No focal deficit present.     Mental Status: He is alert and oriented to person, place, and time.  Psychiatric:        Mood and Affect: Mood normal.        Behavior: Behavior normal.     LABORATORY DATA:  I have reviewed the labs as listed.     Latest Ref Rng & Units 06/06/2022    9:00 AM 05/23/2022    8:06 AM 05/09/2022    7:57 AM  CBC  WBC 4.0 - 10.5 K/uL 11.2  13.4  15.6   Hemoglobin 13.0 - 17.0 g/dL 9.8  10.3  10.6   Hematocrit 39.0 - 52.0 % 30.3  32.1  33.3   Platelets 150 - 400 K/uL 175  199  147       Latest Ref Rng & Units 06/06/2022    9:00 AM 05/23/2022    8:06 AM 05/09/2022    7:57 AM  CMP  Glucose 70 - 99 mg/dL 97  105  93   BUN 8 - 23 mg/dL _0 Creatinine 0.61 - 1.24 mg/dL 1.14  1.10  0.87   Sodium 135 - 145 mmol/L 138  138  139   Potassium 3.5 - 5.1 mmol/L 3.5  3.1  3.4   Chloride 98 - 111 mmol/L 107  107  108   CO2 22 - 32 mmol/L _1 Calcium 8.9 - 10.3 mg/dL 8.4  8.4  8.8   Total Protein 6.5 - 8.1 g/dL 6.7  6.8  7.0   Total Bilirubin 0.3 - 1.2 mg/dL 0.4  0.4  0.1   Alkaline Phos 38 - 126 U/L 73  78  75   AST 15 - 41 U/L _0 ALT 0 - 44 U/L _1 DIAGNOSTIC IMAGING:  I have independently reviewed the scans and discussed with the patient. No results found.   ASSESSMENT:  Left lung mass with multiple lung nodules: - Presentation with dry cough for 6 months. - 30 pound weight loss in the last couple of years, weight stable over the last 6 months. - CT chest with contrast on 12/16/2021 showed bulky left hilar mass measuring 8.1 x 7.5 cm.  Numerous bilateral lung nodules of varying sizes.  Left adrenal nodule measuring 2.8 x 2.2 cm.  Mediastinal and bilateral hilar adenopathy with the largest pretracheal node  measuring 4.1 x 3.1 cm. - MRI of the brain from 01/04/2022 which was negative for metastatic disease. - CTAP from 01/03/2022 which showed isolated left adrenal metastasis with no other evidence of metastatic disease in the abdomen or pelvis. - Pathology of left lung biopsy which shows adenocarcinoma with necrosis.  CK20 positive and CDX2 positive but negative for CK7 indicating colonic primary. 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  HER2 negative.  TMB low.  MSI-stable.  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  Other targetable mutations negative. - FOLFIRI started on 02/22/2022, bevacizumab added with cycle 4  - CT CAP (05/17/2022): Mediastinal and hilar lymph nodes have decreased in size.  Largest perihilar left lower lobe lung mass has decreased in size.  Right upper lobe mass slightly increased in size.  Other nodules are stable.  Left adrenal mass decreased to 1.3 cm from 2.8 cm. - CT scan showed mixed response as it was compared to CT from 12/16/2021.  He did not start chemotherapy until 02/22/2022.  Social/family history: - Lives by himself.  He paints houses for living. - He quit smoking 12 years back and started back again 1 and half year ago and smoked half pack per day.  He quit again about 1 week ago. - Father had cancer, type unknown to the patient.  Brother died of brain tumor.  3.  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  Margins negative.  No lymphovascular or perineural invasion. - Received 3 cycles of XELOX followed by Xeloda for total of 6 months.  Oxaliplatin discontinued during cycle 4 secondary to transaminitis, elevated bilirubin and thrombocytopenia.  However he was also treated for hep C with Harvoni after that.   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  Largest perihilar left lower lobe lung mass has  decreased in size.  Right upper lobe mass slightly increased in size.  Other nodules are stable.  Left adrenal mass decreased to 1.3 cm from 2.8 cm. - CT scan showed mixed response as it was compared to CT from 12/16/2021.  He did not start chemotherapy until 02/22/2022. - He has lost 8 pounds due to decreased appetite. - We will start him on Megace 400 mg twice daily.  He will also receive Maalox with lidocaine for mucositis.  He is currently using Biotene which is not helping. - Reviewed labs today which shows normal LFTs and CBC. - Proceed with treatment today and in 2 weeks.  RTC 4 weeks for follow-up.  2.  Lower rib/epigastric pain: - He has throbbing pain in the sternal region and ribs and lasts few seconds. - Continue hydrocodone 5/325 as needed which is helping.  3.  Normocytic anemia: - Continue B12 1 mg tablet daily.  Hemoglobin is 11.  4.  Difficulty falling asleep: - Continue trazodone 50 mg at bedtime which is helping.  5.  Hypertension: - Continue Norvasc 10 mg daily.  Blood pressure is controlled.   Orders placed this encounter:  No orders of the defined types were placed in this encounter.    Derek Jack, MD [place] [phone]   I, Thana Ates, am acting as a scribe for Dr. Derek Jack.  I, Derek Jack MD, have reviewed the above documentation for accuracy and completeness, and I agree with the above.

## 2022-06-27 NOTE — Patient Instructions (Addendum)
[facility] Discharge Instructions   You were seen and examined today by Dr. Katragadda.  He reviewed the results of your lab work which are normal/stable.   We will proceed with your treatment today.  Return as scheduled.    Thank you for choosing  [facility] Center at [facility] to provide your oncology and hematology care.  To afford each patient quality time with our provider, please arrive at least 15 minutes before your scheduled appointment time.   If you have a lab appointment with the Cancer Center please come in thru the Main Entrance and check in at the main information desk.  You need to re-schedule your appointment should you arrive 10 or more minutes late.  We strive to give you quality time with our providers, and arriving late affects you and other patients whose appointments are after yours.  Also, if you no show three or more times for appointments you may be dismissed from the clinic at the providers discretion.     Again, thank you for choosing [facility].  Our hope is that these requests will decrease the amount of time that you wait before being seen by our physicians.       _____________________________________________________________  Should you have questions after your visit to [facility], please contact our office at [phone] and follow the prompts.  Our office hours are 8:00 a.m. and 4:30 p.m. Monday - Friday.  Please note that voicemails left after 4:00 p.m. may not be returned until the following business day.  We are closed weekends and major holidays.  You do have access to a nurse 24-7, just call the main number to the clinic [phone] and do not press any options, hold on the line and a nurse will answer the phone.    For prescription refill requests, have your pharmacy contact our office and allow 72 hours.    Due to Covid, you will need to wear a mask upon entering  the hospital. If you do not have a mask, a mask will be given to you at the Main Entrance upon arrival. For doctor visits, patients may have 1 support person age 18 or older with them. For treatment visits, patients can not have anyone with them due to social distancing guidelines and our immunocompromised population.      

## 2022-06-27 NOTE — Patient Instructions (Signed)
[place]  Discharge Instructions: Thank you for choosing [facility] to provide your oncology and hematology care.  If you have a lab appointment with the [facility], please come in thru the Main Entrance and check in at the main information desk.  Wear comfortable clothing and clothing appropriate for easy access to any Portacath or PICC line.   We strive to give you quality time with your provider. You may need to reschedule your appointment if you arrive late (15 or more minutes).  Arriving late affects you and other patients whose appointments are after yours.  Also, if you miss three or more appointments without notifying the office, you may be dismissed from the clinic at the provider's discretion.      For prescription refill requests, have your pharmacy contact our office and allow 72 hours for refills to be completed.    Today you received the following chemotherapy and/or immunotherapy agents Bevacizumab and Folfiri, return as scheduled. The chemotherapy medication bag should finish at 46 hours, 96 hours, or 7 days. For example, if your pump is scheduled for 46 hours and it was put on at 4:00 p.m., it should finish at 2:00 p.m. the day it is scheduled to come off regardless of your appointment time.     Estimated time to finish at 1115   If the display on your pump reads "Low Volume" and it is beeping, take the batteries out of the pump and come to the cancer center for it to be taken off.   If the pump alarms go off prior to the pump reading "Low Volume" then call [phone] and someone can assist you.  If the plunger comes out and the chemotherapy medication is leaking out, please use your home chemo spill kit to clean up the spill. Do NOT use paper towels or other household products.  If you have problems or questions regarding your pump, please call either [phone] (24 hours a day) or the cancer center Monday-Friday 8:00 a.m.- 4:30 p.m. at  the clinic number and we will assist you. If you are unable to get assistance, then go to the nearest Emergency Department and ask the staff to contact the IV team for assistance.     To help prevent nausea and vomiting after your treatment, we encourage you to take your nausea medication as directed.  BELOW ARE SYMPTOMS THAT SHOULD BE REPORTED IMMEDIATELY: *FEVER GREATER THAN 100.4 F (38 C) OR HIGHER *CHILLS OR SWEATING *NAUSEA AND VOMITING THAT IS NOT CONTROLLED WITH YOUR NAUSEA MEDICATION *UNUSUAL SHORTNESS OF BREATH *UNUSUAL BRUISING OR BLEEDING *URINARY PROBLEMS (pain or burning when urinating, or frequent urination) *BOWEL PROBLEMS (unusual diarrhea, constipation, pain near the anus) TENDERNESS IN MOUTH AND THROAT WITH OR WITHOUT PRESENCE OF ULCERS (sore throat, sores in mouth, or a toothache) UNUSUAL RASH, SWELLING OR PAIN  UNUSUAL VAGINAL DISCHARGE OR ITCHING   Items with * indicate a potential emergency and should be followed up as soon as possible or go to the Emergency Department if any problems should occur.  Please show the CHEMOTHERAPY ALERT CARD or IMMUNOTHERAPY ALERT CARD at check-in to the Emergency Department and triage nurse.  Should you have questions after your visit or need to cancel or reschedule your appointment, please contact [facility] [phone]  and follow the prompts.  Office hours are 8:00 a.m. to 4:30 p.m. Monday - Friday. Please note that voicemails left after 4:00 p.m. may not be returned until the following  business day.  We are closed weekends and major holidays. You have access to a nurse at all times for urgent questions. Please call the main number to the clinic [phone] and follow the prompts.  For any non-urgent questions, you may also contact your provider using MyChart. We now offer e-Visits for anyone 96 and older to request care online for non-urgent symptoms. For details visit [email or web address].   Also download the  MyChart app! Go to the app store, search "MyChart", open the app, select [facility], and log in with your MyChart username and password.  Masks are optional in the cancer centers. If you would like for your care team to wear a mask while they are taking care of you, please let them know. For doctor visits, patients may have with them one support person who is at least 65 years old. At this time, visitors are not allowed in the infusion area.

## 2022-06-29 ENCOUNTER — Inpatient Hospital Stay (HOSPITAL_COMMUNITY): Payer: 59

## 2022-06-29 VITALS — BP 144/89 | HR 55 | Temp 96.2°F | Resp 18

## 2022-06-29 DIAGNOSIS — C180 Malignant neoplasm of cecum: Secondary | ICD-10-CM

## 2022-06-29 DIAGNOSIS — Z95828 Presence of other vascular implants and grafts: Secondary | ICD-10-CM

## 2022-06-29 DIAGNOSIS — D702 Other drug-induced agranulocytosis: Secondary | ICD-10-CM

## 2022-06-29 DIAGNOSIS — Z5112 Encounter for antineoplastic immunotherapy: Secondary | ICD-10-CM | POA: Diagnosis not present

## 2022-06-29 MED ORDER — HEPARIN SOD (PORK) LOCK FLUSH 100 UNIT/ML IV SOLN
500.0000 [IU] | Freq: Once | INTRAVENOUS | Status: AC | PRN
  Administered 2022-06-29: 500 [IU]

## 2022-06-29 MED ORDER — PEGFILGRASTIM-CBQV 6 MG/0.6ML ~~LOC~~ SOSY
6.0000 mg | PREFILLED_SYRINGE | Freq: Once | SUBCUTANEOUS | Status: AC
  Administered 2022-06-29: 6 mg via SUBCUTANEOUS
  Filled 2022-06-29: qty 0.6

## 2022-06-29 MED ORDER — FILGRASTIM-SNDZ 480 MCG/0.8ML IJ SOSY: 480.0000 ug | PREFILLED_SYRINGE | Freq: Once | INTRAMUSCULAR | Status: DC

## 2022-06-29 MED ORDER — SODIUM CHLORIDE 0.9% FLUSH
10.0000 mL | INTRAVENOUS | Status: DC | PRN
  Administered 2022-06-29: 10 mL

## 2022-06-29 NOTE — Patient Instructions (Signed)
[facility]  Discharge Instructions: Thank you for choosing [facility] to provide your oncology and hematology care.  If you have a lab appointment with the [facility], please come in thru the Main Entrance and check in at the main information desk.  Wear comfortable clothing and clothing appropriate for easy access to any Portacath or PICC line.   We strive to give you quality time with your provider. You may need to reschedule your appointment if you arrive late (15 or more minutes).  Arriving late affects you and other patients whose appointments are after yours.  Also, if you miss three or more appointments without notifying the office, you may be dismissed from the clinic at the provider's discretion.      For prescription refill requests, have your pharmacy contact our office and allow 72 hours for refills to be completed.    Today you received the following chemotherapy and/or immunotherapy agents Udenyca  pump stop    To help prevent nausea and vomiting after your treatment, we encourage you to take your nausea medication as directed.  BELOW ARE SYMPTOMS THAT SHOULD BE REPORTED IMMEDIATELY: *FEVER GREATER THAN 100.4 F (38 C) OR HIGHER *CHILLS OR SWEATING *NAUSEA AND VOMITING THAT IS NOT CONTROLLED WITH YOUR NAUSEA MEDICATION *UNUSUAL SHORTNESS OF BREATH *UNUSUAL BRUISING OR BLEEDING *URINARY PROBLEMS (pain or burning when urinating, or frequent urination) *BOWEL PROBLEMS (unusual diarrhea, constipation, pain near the anus) TENDERNESS IN MOUTH AND THROAT WITH OR WITHOUT PRESENCE OF ULCERS (sore throat, sores in mouth, or a toothache) UNUSUAL RASH, SWELLING OR PAIN  UNUSUAL VAGINAL DISCHARGE OR ITCHING   Items with * indicate a potential emergency and should be followed up as soon as possible or go to the Emergency Department if any problems should occur.  Please show the CHEMOTHERAPY ALERT CARD or IMMUNOTHERAPY ALERT CARD at check-in to the Emergency  Department and triage nurse.  Should you have questions after your visit or need to cancel or reschedule your appointment, please contact [facility] [phone]  and follow the prompts.  Office hours are 8:00 a.m. to 4:30 p.m. Monday - Friday. Please note that voicemails left after 4:00 p.m. may not be returned until the following business day.  We are closed weekends and major holidays. You have access to a nurse at all times for urgent questions. Please call the main number to the clinic [phone] and follow the prompts.  For any non-urgent questions, you may also contact your provider using MyChart. We now offer e-Visits for anyone 57 and older to request care online for non-urgent symptoms. For details visit [email or web address].   Also download the MyChart app! Go to the app store, search "MyChart", open the app, select [facility], and log in with your MyChart username and password.  Masks are optional in the cancer centers. If you would like for your care team to wear a mask while they are taking care of you, please let them know. For doctor visits, patients may have with them one support person who is at least 65 years old. At this time, visitors are not allowed in the infusion area.

## 2022-06-29 NOTE — Progress Notes (Signed)
Patient presents today for 5FU pump stop and disconnection after 46 hour continous infusion.   5FU pump deaccessed.  Patients port flushed without difficulty.  Good blood return noted with no bruising or swelling noted at site.  Needle removed intact.  Band aid applied.  Udenyca administration without incident; injection site WNL; see MAR for injection details.  Patient tolerated procedure well and without incident.  No questions or complaints noted at this time. VSS with discharge and left in satisfactory condition via wheelchair with no s/s of distress noted.

## 2022-06-30 ENCOUNTER — Encounter (HOSPITAL_COMMUNITY): Payer: Self-pay | Admitting: Hematology

## 2022-07-03 ENCOUNTER — Other Ambulatory Visit: Payer: Self-pay

## 2022-07-11 ENCOUNTER — Inpatient Hospital Stay: Payer: 59

## 2022-07-11 ENCOUNTER — Inpatient Hospital Stay: Payer: 59 | Attending: Hematology

## 2022-07-11 ENCOUNTER — Other Ambulatory Visit: Payer: Self-pay

## 2022-07-11 VITALS — BP 154/91 | HR 53 | Temp 96.1°F | Resp 18

## 2022-07-11 DIAGNOSIS — Z5189 Encounter for other specified aftercare: Secondary | ICD-10-CM | POA: Diagnosis not present

## 2022-07-11 DIAGNOSIS — D649 Anemia, unspecified: Secondary | ICD-10-CM | POA: Diagnosis not present

## 2022-07-11 DIAGNOSIS — I10 Essential (primary) hypertension: Secondary | ICD-10-CM | POA: Insufficient documentation

## 2022-07-11 DIAGNOSIS — Z5112 Encounter for antineoplastic immunotherapy: Secondary | ICD-10-CM | POA: Insufficient documentation

## 2022-07-11 DIAGNOSIS — Z87891 Personal history of nicotine dependence: Secondary | ICD-10-CM | POA: Insufficient documentation

## 2022-07-11 DIAGNOSIS — C180 Malignant neoplasm of cecum: Secondary | ICD-10-CM | POA: Diagnosis present

## 2022-07-11 DIAGNOSIS — G478 Other sleep disorders: Secondary | ICD-10-CM | POA: Diagnosis not present

## 2022-07-11 DIAGNOSIS — Z95828 Presence of other vascular implants and grafts: Secondary | ICD-10-CM

## 2022-07-11 DIAGNOSIS — C7800 Secondary malignant neoplasm of unspecified lung: Secondary | ICD-10-CM | POA: Insufficient documentation

## 2022-07-11 DIAGNOSIS — E876 Hypokalemia: Secondary | ICD-10-CM

## 2022-07-11 DIAGNOSIS — C7972 Secondary malignant neoplasm of left adrenal gland: Secondary | ICD-10-CM | POA: Diagnosis not present

## 2022-07-11 DIAGNOSIS — Z5111 Encounter for antineoplastic chemotherapy: Secondary | ICD-10-CM | POA: Diagnosis present

## 2022-07-11 DIAGNOSIS — Z79899 Other long term (current) drug therapy: Secondary | ICD-10-CM | POA: Insufficient documentation

## 2022-07-11 DIAGNOSIS — C189 Malignant neoplasm of colon, unspecified: Secondary | ICD-10-CM

## 2022-07-11 LAB — COMPREHENSIVE METABOLIC PANEL
ALT: 11 U/L (ref 0–44)
AST: 18 U/L (ref 15–41)
Albumin: 3.6 g/dL (ref 3.5–5.0)
Alkaline Phosphatase: 78 U/L (ref 38–126)
Anion gap: 5 (ref 5–15)
BUN: 11 mg/dL (ref 8–23)
CO2: 23 mmol/L (ref 22–32)
Calcium: 8.5 mg/dL — ABNORMAL LOW (ref 8.9–10.3)
Chloride: 111 mmol/L (ref 98–111)
Creatinine, Ser: 0.91 mg/dL (ref 0.61–1.24)
GFR, Estimated: >60 mL/min (ref >60)
Glucose, Bld: 89 mg/dL (ref 70–99)
Potassium: 3.1 mmol/L — ABNORMAL LOW (ref 3.5–5.1)
Sodium: 139 mmol/L (ref 135–145)
Total Bilirubin: 0.5 mg/dL (ref 0.3–1.2)
Total Protein: 7 g/dL (ref 6.5–8.1)

## 2022-07-11 LAB — CBC WITH DIFFERENTIAL/PLATELET
Abs Immature Granulocytes: 0.47 10*3/uL — ABNORMAL HIGH (ref 0.00–0.07)
Basophils Absolute: 0.1 10*3/uL (ref 0.0–0.1)
Basophils Relative: 1 %
Eosinophils Absolute: 0.2 10*3/uL (ref 0.0–0.5)
Eosinophils Relative: 2 %
HCT: 32.7 % — ABNORMAL LOW (ref 39.0–52.0)
Hemoglobin: 10.6 g/dL — ABNORMAL LOW (ref 13.0–17.0)
Immature Granulocytes: 4 %
Lymphocytes Relative: 21 %
Lymphs Abs: 2.3 10*3/uL (ref 0.7–4.0)
MCH: 31.1 pg (ref 26.0–34.0)
MCHC: 32.4 g/dL (ref 30.0–36.0)
MCV: 95.9 fL (ref 80.0–100.0)
Monocytes Absolute: 1 10*3/uL (ref 0.1–1.0)
Monocytes Relative: 9 %
Neutro Abs: 6.7 10*3/uL (ref 1.7–7.7)
Neutrophils Relative %: 63 %
Platelets: 167 10*3/uL (ref 150–400)
RBC: 3.41 MIL/uL — ABNORMAL LOW (ref 4.22–5.81)
RDW: 18.6 % — ABNORMAL HIGH (ref 11.5–15.5)
WBC: 10.7 10*3/uL — ABNORMAL HIGH (ref 4.0–10.5)
nRBC: 0 % (ref 0.0–0.2)

## 2022-07-11 LAB — MAGNESIUM: Magnesium: 1.9 mg/dL (ref 1.7–2.4)

## 2022-07-11 MED ORDER — SODIUM CHLORIDE 0.9 % IV SOLN
180.0000 mg/m2 | Freq: Once | INTRAVENOUS | Status: AC
  Administered 2022-07-11: 360 mg via INTRAVENOUS
  Filled 2022-07-11: qty 16

## 2022-07-11 MED ORDER — ATROPINE SULFATE 1 MG/ML IV SOLN
0.5000 mg | Freq: Once | INTRAVENOUS | Status: AC
  Administered 2022-07-11: 0.5 mg via INTRAVENOUS
  Filled 2022-07-11: qty 1

## 2022-07-11 MED ORDER — SODIUM CHLORIDE 0.9 % IV SOLN
10.0000 mg | Freq: Once | INTRAVENOUS | Status: AC
  Administered 2022-07-11: 10 mg via INTRAVENOUS
  Filled 2022-07-11: qty 10

## 2022-07-11 MED ORDER — PALONOSETRON HCL INJECTION 0.25 MG/5ML
0.2500 mg | Freq: Once | INTRAVENOUS | Status: AC
  Administered 2022-07-11: 0.25 mg via INTRAVENOUS
  Filled 2022-07-11: qty 5

## 2022-07-11 MED ORDER — POTASSIUM CHLORIDE CRYS ER 20 MEQ PO TBCR
40.0000 meq | EXTENDED_RELEASE_TABLET | Freq: Once | ORAL | Status: AC
  Administered 2022-07-11: 40 meq via ORAL
  Filled 2022-07-11: qty 2

## 2022-07-11 MED ORDER — HEPARIN SOD (PORK) LOCK FLUSH 100 UNIT/ML IV SOLN: 500.0000 [IU] | Freq: Once | INTRAVENOUS | Status: DC | PRN

## 2022-07-11 MED ORDER — SODIUM CHLORIDE 0.9 % IV SOLN
5000.0000 mg | INTRAVENOUS | Status: DC
  Administered 2022-07-11: 5000 mg via INTRAVENOUS
  Filled 2022-07-11: qty 100

## 2022-07-11 MED ORDER — SODIUM CHLORIDE 0.9% FLUSH: 10.0000 mL | INTRAVENOUS | Status: DC | PRN

## 2022-07-11 MED ORDER — FLUOROURACIL CHEMO INJECTION 2.5 GM/50ML
400.0000 mg/m2 | Freq: Once | INTRAVENOUS | Status: AC
  Administered 2022-07-11: 800 mg via INTRAVENOUS
  Filled 2022-07-11: qty 16

## 2022-07-11 MED ORDER — SODIUM CHLORIDE 0.9 % IV SOLN
5.0000 mg/kg | Freq: Once | INTRAVENOUS | Status: AC
  Administered 2022-07-11: 400 mg via INTRAVENOUS
  Filled 2022-07-11: qty 16

## 2022-07-11 MED ORDER — SODIUM CHLORIDE 0.9 % IV SOLN
400.0000 mg/m2 | Freq: Once | INTRAVENOUS | Status: AC
  Administered 2022-07-11: 792 mg via INTRAVENOUS
  Filled 2022-07-11: qty 39.6

## 2022-07-11 MED ORDER — SODIUM CHLORIDE 0.9 % IV SOLN: Freq: Once | INTRAVENOUS | Status: DC

## 2022-07-11 MED ORDER — SODIUM CHLORIDE 0.9 % IV SOLN: Freq: Once | INTRAVENOUS | Status: AC

## 2022-07-11 NOTE — Patient Instructions (Signed)
[facility]  Discharge Instructions: Thank you for choosing [facility] to provide your oncology and hematology care.  If you have a lab appointment with the [facility], please come in thru the Main Entrance and check in at the main information desk.  Wear comfortable clothing and clothing appropriate for easy access to any Portacath or PICC line.   We strive to give you quality time with your provider. You may need to reschedule your appointment if you arrive late (15 or more minutes).  Arriving late affects you and other patients whose appointments are after yours.  Also, if you miss three or more appointments without notifying the office, you may be dismissed from the clinic at the provider's discretion.      For prescription refill requests, have your pharmacy contact our office and allow 72 hours for refills to be completed.    Today you received the following chemotherapy and/or immunotherapy agents Zirabev/FOLFIRI.   Fluorouracil, 5FU; Diclofenac topical cream What is this medication? FLUOROURACIL; DICLOFENAC (flure oh YOOR a sil; dye KLOE fen ak) is a combination of a topical chemotherapy agent and non-steroidal anti-inflammatory drug (NSAID). It is used on the skin to treat skin cancer and skin conditions that could become cancer. This medicine may be used for other purposes; ask your health care provider or pharmacist if you have questions. COMMON BRAND NAME(S): FLUORAC What should I tell my care team before I take this medication? They need to know if you have any of these conditions: bleeding problems cigarette smoker DPD enzyme deficiency heart disease high blood pressure if you frequently drink alcohol containing drinks kidney disease liver disease open or infected skin stomach problems swelling or open sores at the treatment site recent or planned coronary artery bypass graft (CABG) surgery an unusual or allergic reaction to  fluorouracil, diclofenac, aspirin, other NSAIDs, other medicines, foods, dyes, or preservatives pregnant or trying to get pregnant breast-feeding How should I use this medication? This medicine is only for use on the skin. Follow the directions on the prescription label. Wash hands before and after use. Wash affected area and gently pat dry. To apply this medicine use a cotton-tipped applicator, or use gloves if applying with fingertips. If applied with unprotected fingertips, it is very important to wash your hands well after you apply this medicine. Avoid applying to the eyes, nose, or mouth. Apply enough medicine to cover the affected area. You can cover the area with a light gauze dressing, but do not use tight or air-tight dressings. Finish the full course prescribed by your doctor or health care professional, even if you think your condition is better. Do not stop taking except on the advice of your doctor or health care professional. Talk to your pediatrician regarding the use of this medicine in children. Special care may be needed. Overdosage: If you think you have taken too much of this medicine contact a poison control center or emergency room at once. NOTE: This medicine is only for you. Do not share this medicine with others. What if I miss a dose? If you miss a dose, apply it as soon as you can. If it is almost time for your next dose, only use that dose. Do not apply extra doses. Contact your doctor or health care professional if you miss more than one dose. What may interact with this medication? Interactions are not expected. Do not use any other skin products without telling your doctor or health care  professional. This list may not describe all possible interactions. Give your health care provider a list of all the medicines, herbs, non-prescription drugs, or dietary supplements you use. Also tell them if you smoke, drink alcohol, or use illegal drugs. Some items may interact with your  medicine. What should I watch for while using this medication? Visit your doctor or healthcare provider for checks on your progress. You will need to use this medicine for 2 to 6 weeks. This may be longer depending on the condition being treated. You may not see full healing for another 1 to 2 months after you stop using the medicine. This medicine may cause serious skin reactions. They can happen weeks to months after starting the medicine. Contact your healthcare provider right away if you notice fevers or flu-like symptoms with a rash. The rash may be red or purple and then turn into blisters or peeling of the skin. Or, you might notice a red rash with swelling of the face, lips or lymph nodes in your neck or under your arms. Treated areas of skin can look unsightly during and for several weeks after treatment with this medicine. This medicine can make you more sensitive to the sun. Keep out of the sun. If you cannot avoid being in the sun, wear protective clothing and use sunscreen. Do not use sun lamps or tanning beds/booths. Serious side effects or death can occur if a pet comes into contact with this drug. Contact a vet right away if a pet touches or licks the drug on your skin or comes into contact with the container. Throw away or wash any items used to apply this drug. Wash your hands after applying the drug. Make sure the drug does not get on clothing, carpet, or furniture. If you cannot avoid skin to skin contact with your pet, ask your health care provider if you can cover the area(s) where you apply this drug. Do not become pregnant while taking this medicine. Women should inform their doctor if they wish to become pregnant or think they might be pregnant. There is a potential for serious side effects to an unborn child. Talk to your healthcare provider or pharmacist for more information. What side effects may I notice from receiving this medication? Side effects that you should report to your  doctor or health care professional as soon as possible: allergic reactions like skin rash, itching or hives, swelling of the face, lips, or tongue black or bloody stools, blood in the urine or vomit blurred vision chest pain difficulty breathing or wheezing rash, fever, and swollen lymph nodes redness, blistering, peeling or loosening of the skin, including inside the mouth severe redness and swelling of normal skin slurred speech or weakness on one side of the body trouble passing urine or change in the amount of urine unexplained weight gain or swelling unusually weak or tired yellowing of eyes or skin Side effects that usually do not require medical attention (report to your doctor or health care professional if they continue or are bothersome): increased sensitivity of the skin to sun and ultraviolet light pain and burning of the affected area scaling or swelling of the affected area skin rash, itching of the affected area tenderness This list may not describe all possible side effects. Call your doctor for medical advice about side effects. You may report side effects to FDA at 1-800-FDA-1088. Where should I keep my medication? Keep out of the reach of children and pets. Store at room temperature between  20 and 25 degrees C (68 and 77 degrees F). Throw away any unused medicine after the expiration date. NOTE: This sheet is a summary. It may not cover all possible information. If you have questions about this medicine, talk to your doctor, pharmacist, or health care provider.  2023 Elsevier/Gold Standard (2021-10-28 00:00:00)   Leucovorin injection What is this medication? LEUCOVORIN (loo koe VOR in) is used to prevent or treat the harmful effects of some medicines. This medicine is used to treat anemia caused by a low amount of folic acid in the body. It is also used with 5-fluorouracil (5-FU) to treat colon cancer. This medicine may be used for other purposes; ask your health  care provider or pharmacist if you have questions. What should I tell my care team before I take this medication? They need to know if you have any of these conditions: anemia from low levels of vitamin B-12 in the blood an unusual or allergic reaction to leucovorin, folic acid, other medicines, foods, dyes, or preservatives pregnant or trying to get pregnant breast-feeding How should I use this medication? This medicine is for injection into a muscle or into a vein. It is given by a health care professional in a hospital or clinic setting. Talk to your pediatrician regarding the use of this medicine in children. Special care may be needed. Overdosage: If you think you have taken too much of this medicine contact a poison control center or emergency room at once. NOTE: This medicine is only for you. Do not share this medicine with others. What if I miss a dose? This does not apply. What may interact with this medication? capecitabine fluorouracil phenobarbital phenytoin primidone trimethoprim-sulfamethoxazole This list may not describe all possible interactions. Give your health care provider a list of all the medicines, herbs, non-prescription drugs, or dietary supplements you use. Also tell them if you smoke, drink alcohol, or use illegal drugs. Some items may interact with your medicine. What should I watch for while using this medication? Your condition will be monitored carefully while you are receiving this medicine. This medicine may increase the side effects of 5-fluorouracil, 5-FU. Tell your doctor or health care professional if you have diarrhea or mouth sores that do not get better or that get worse. What side effects may I notice from receiving this medication? Side effects that you should report to your doctor or health care professional as soon as possible: allergic reactions like skin rash, itching or hives, swelling of the face, lips, or tongue breathing problems fever,  infection mouth sores unusual bleeding or bruising unusually weak or tired Side effects that usually do not require medical attention (report to your doctor or health care professional if they continue or are bothersome): constipation or diarrhea loss of appetite nausea, vomiting This list may not describe all possible side effects. Call your doctor for medical advice about side effects. You may report side effects to FDA at 1-800-FDA-1088. Where should I keep my medication? This drug is given in a hospital or clinic and will not be stored at home. NOTE: This sheet is a summary. It may not cover all possible information. If you have questions about this medicine, talk to your doctor, pharmacist, or health care provider.  2023 Elsevier/Gold Standard (2008-06-04 00:00:00)   Irinotecan injection What is this medication? IRINOTECAN (ir in oh TEE kan ) is a chemotherapy drug. It is used to treat colon and rectal cancer. This medicine may be used for other purposes; ask your  health care provider or pharmacist if you have questions. COMMON BRAND NAME(S): Camptosar What should I tell my care team before I take this medication? They need to know if you have any of these conditions: dehydration diarrhea infection (especially a virus infection such as chickenpox, cold sores, or herpes) liver disease low blood counts, like low white cell, platelet, or red cell counts low levels of calcium, magnesium, or potassium in the blood recent or ongoing radiation therapy an unusual or allergic reaction to irinotecan, other medicines, foods, dyes, or preservatives pregnant or trying to get pregnant breast-feeding How should I use this medication? This drug is given as an infusion into a vein. It is administered in a hospital or clinic by a specially trained health care professional. Talk to your pediatrician regarding the use of this medicine in children. Special care may be needed. Overdosage: If you  think you have taken too much of this medicine contact a poison control center or emergency room at once. NOTE: This medicine is only for you. Do not share this medicine with others. What if I miss a dose? It is important not to miss your dose. Call your doctor or health care professional if you are unable to keep an appointment. What may interact with this medication? Do not take this medicine with any of the following medications: cobicistat itraconazole This medicine may interact with the following medications: antiviral medicines for HIV or AIDS certain antibiotics like rifampin or rifabutin certain medicines for fungal infections like ketoconazole, posaconazole, and voriconazole certain medicines for seizures like carbamazepine, phenobarbital, phenotoin clarithromycin gemfibrozil nefazodone St. John's Wort This list may not describe all possible interactions. Give your health care provider a list of all the medicines, herbs, non-prescription drugs, or dietary supplements you use. Also tell them if you smoke, drink alcohol, or use illegal drugs. Some items may interact with your medicine. What should I watch for while using this medication? Your condition will be monitored carefully while you are receiving this medicine. You will need important blood work done while you are taking this medicine. This drug may make you feel generally unwell. This is not uncommon, as chemotherapy can affect healthy cells as well as cancer cells. Report any side effects. Continue your course of treatment even though you feel ill unless your doctor tells you to stop. In some cases, you may be given additional medicines to help with side effects. Follow all directions for their use. You may get drowsy or dizzy. Do not drive, use machinery, or do anything that needs mental alertness until you know how this medicine affects you. Do not stand or sit up quickly, especially if you are an older patient. This reduces  the risk of dizzy or fainting spells. Call your health care professional for advice if you get a fever, chills, or sore throat, or other symptoms of a cold or flu. Do not treat yourself. This medicine decreases your body's ability to fight infections. Try to avoid being around people who are sick. Avoid taking products that contain aspirin, acetaminophen, ibuprofen, naproxen, or ketoprofen unless instructed by your doctor. These medicines may hide a fever. This medicine may increase your risk to bruise or bleed. Call your doctor or health care professional if you notice any unusual bleeding. Be careful brushing and flossing your teeth or using a toothpick because you may get an infection or bleed more easily. If you have any dental work done, tell your dentist you are receiving this medicine. Do not become  pregnant while taking this medicine or for 6 months after stopping it. Women should inform their health care professional if they wish to become pregnant or think they might be pregnant. Men should not father a child while taking this medicine and for 3 months after stopping it. There is potential for serious side effects to an unborn child. Talk to your health care professional for more information. Do not breast-feed an infant while taking this medicine or for 7 days after stopping it. This medicine has caused ovarian failure in some women. This medicine may make it more difficult to get pregnant. Talk to your health care professional if you are concerned about your fertility. This medicine has caused decreased sperm counts in some men. This may make it more difficult to father a child. Talk to your health care professional if you are concerned about your fertility. What side effects may I notice from receiving this medication? Side effects that you should report to your doctor or health care professional as soon as possible: allergic reactions like skin rash, itching or hives, swelling of the face,  lips, or tongue chest pain diarrhea flushing, runny nose, sweating during infusion low blood counts - this medicine may decrease the number of white blood cells, red blood cells and platelets. You may be at increased risk for infections and bleeding. nausea, vomiting pain, swelling, warmth in the leg signs of decreased platelets or bleeding - bruising, pinpoint red spots on the skin, black, tarry stools, blood in the urine signs of infection - fever or chills, cough, sore throat, pain or difficulty passing urine signs of decreased red blood cells - unusually weak or tired, fainting spells, lightheadedness Side effects that usually do not require medical attention (report to your doctor or health care professional if they continue or are bothersome): constipation hair loss headache loss of appetite mouth sores stomach pain This list may not describe all possible side effects. Call your doctor for medical advice about side effects. You may report side effects to FDA at 1-800-FDA-1088. Where should I keep my medication? This drug is given in a hospital or clinic and will not be stored at home. NOTE: This sheet is a summary. It may not cover all possible information. If you have questions about this medicine, talk to your doctor, pharmacist, or health care provider.  2023 Elsevier/Gold Standard (2021-10-28 00:00:00)    Bevacizumab injection What is this medication? BEVACIZUMAB (be va SIZ yoo mab) is a monoclonal antibody. It is used to treat many types of cancer. This medicine may be used for other purposes; ask your health care provider or pharmacist if you have questions. COMMON BRAND NAME(S): Alymsys, Avastin, MVASI, Noah Charon What should I tell my care team before I take this medication? They need to know if you have any of these conditions: diabetes heart disease high blood pressure history of coughing up blood prior anthracycline chemotherapy (e.g., doxorubicin, daunorubicin,  epirubicin) recent or ongoing radiation therapy recent or planning to have surgery stroke an unusual or allergic reaction to bevacizumab, hamster proteins, mouse proteins, other medicines, foods, dyes, or preservatives pregnant or trying to get pregnant breast-feeding How should I use this medication? This medicine is for infusion into a vein. It is given by a health care professional in a hospital or clinic setting. Talk to your pediatrician regarding the use of this medicine in children. Special care may be needed. Overdosage: If you think you have taken too much of this medicine contact a poison control center  or emergency room at once. NOTE: This medicine is only for you. Do not share this medicine with others. What if I miss a dose? It is important not to miss your dose. Call your doctor or health care professional if you are unable to keep an appointment. What may interact with this medication? Interactions are not expected. This list may not describe all possible interactions. Give your health care provider a list of all the medicines, herbs, non-prescription drugs, or dietary supplements you use. Also tell them if you smoke, drink alcohol, or use illegal drugs. Some items may interact with your medicine. What should I watch for while using this medication? Your condition will be monitored carefully while you are receiving this medicine. You will need important blood work and urine testing done while you are taking this medicine. This medicine may increase your risk to bruise or bleed. Call your doctor or health care professional if you notice any unusual bleeding. Before having surgery, talk to your health care provider to make sure it is ok. This drug can increase the risk of poor healing of your surgical site or wound. You will need to stop this drug for 28 days before surgery. After surgery, wait at least 28 days before restarting this drug. Make sure the surgical site or wound is  healed enough before restarting this drug. Talk to your health care provider if questions. Do not become pregnant while taking this medicine or for 6 months after stopping it. Women should inform their doctor if they wish to become pregnant or think they might be pregnant. There is a potential for serious side effects to an unborn child. Talk to your health care professional or pharmacist for more information. Do not breast-feed an infant while taking this medicine and for 6 months after the last dose. This medicine has caused ovarian failure in some women. This medicine may interfere with the ability to have a child. You should talk to your doctor or health care professional if you are concerned about your fertility. What side effects may I notice from receiving this medication? Side effects that you should report to your doctor or health care professional as soon as possible: allergic reactions like skin rash, itching or hives, swelling of the face, lips, or tongue chest pain or chest tightness chills coughing up blood high fever seizures severe constipation signs and symptoms of bleeding such as bloody or black, tarry stools; red or dark-Taneshia Lorence urine; spitting up blood or Shannelle Alguire material that looks like coffee grounds; red spots on the skin; unusual bruising or bleeding from the eye, gums, or nose signs and symptoms of a blood clot such as breathing problems; chest pain; severe, sudden headache; pain, swelling, warmth in the leg signs and symptoms of a stroke like changes in vision; confusion; trouble speaking or understanding; severe headaches; sudden numbness or weakness of the face, arm or leg; trouble walking; dizziness; loss of balance or coordination stomach pain sweating swelling of legs or ankles vomiting weight gain Side effects that usually do not require medical attention (report to your doctor or health care professional if they continue or are bothersome): back pain changes in  taste decreased appetite dry skin nausea tiredness This list may not describe all possible side effects. Call your doctor for medical advice about side effects. You may report side effects to FDA at 1-800-FDA-1088. Where should I keep my medication? This drug is given in a hospital or clinic and will not be stored at home. NOTE:  This sheet is a summary. It may not cover all possible information. If you have questions about this medicine, talk to your doctor, pharmacist, or health care provider.  2023 Elsevier/Gold Standard (2021-10-28 00:00:00)        To help prevent nausea and vomiting after your treatment, we encourage you to take your nausea medication as directed.  BELOW ARE SYMPTOMS THAT SHOULD BE REPORTED IMMEDIATELY: *FEVER GREATER THAN 100.4 F (38 C) OR HIGHER *CHILLS OR SWEATING *NAUSEA AND VOMITING THAT IS NOT CONTROLLED WITH YOUR NAUSEA MEDICATION *UNUSUAL SHORTNESS OF BREATH *UNUSUAL BRUISING OR BLEEDING *URINARY PROBLEMS (pain or burning when urinating, or frequent urination) *BOWEL PROBLEMS (unusual diarrhea, constipation, pain near the anus) TENDERNESS IN MOUTH AND THROAT WITH OR WITHOUT PRESENCE OF ULCERS (sore throat, sores in mouth, or a toothache) UNUSUAL RASH, SWELLING OR PAIN  UNUSUAL VAGINAL DISCHARGE OR ITCHING   Items with * indicate a potential emergency and should be followed up as soon as possible or go to the Emergency Department if any problems should occur.  Please show the CHEMOTHERAPY ALERT CARD or IMMUNOTHERAPY ALERT CARD at check-in to the Emergency Department and triage nurse.  Should you have questions after your visit or need to cancel or reschedule your appointment, please contact [facility] [phone]  and follow the prompts.  Office hours are 8:00 a.m. to 4:30 p.m. Monday - Friday. Please note that voicemails left after 4:00 p.m. may not be returned until the following business day.  We are closed weekends and major  holidays. You have access to a nurse at all times for urgent questions. Please call the main number to the clinic [phone] and follow the prompts.  For any non-urgent questions, you may also contact your provider using MyChart. We now offer e-Visits for anyone 3 and older to request care online for non-urgent symptoms. For details visit [email or web address].   Also download the MyChart app! Go to the app store, search "MyChart", open the app, select [facility], and log in with your MyChart username and password.  Masks are optional in the cancer centers. If you would like for your care team to wear a mask while they are taking care of you, please let them know. For doctor visits, patients may have with them one support person who is at least 65 years old. At this time, visitors are not allowed in the infusion area.

## 2022-07-11 NOTE — Progress Notes (Signed)
Patient presents today for chemotherapy infusion.  Patient is in satisfactory condition with no complaints voiced.  Vital signs are stable.  Labs reviewed.  All labs are within treatment parameters.  Potassium today is 3.1.  Patient will receive Klor Con 40 mEq PO x one dose today per standing orders from Dr. Delton Coombes.  We will proceed with treatment per MD orders.   Patient tolerated treatment well with no complaints voiced.  Patient connected to home infusion 5FU pump with no difficulty.   Patient left ambulatory in stable condition.  Vital signs stable at discharge.  Follow up as scheduled.

## 2022-07-11 NOTE — Progress Notes (Signed)
Patients port flushed without difficulty.  Good blood return noted with no bruising or swelling noted at site.  Patient remains accessed for chemotherapy treatment.  

## 2022-07-12 ENCOUNTER — Other Ambulatory Visit: Payer: Self-pay

## 2022-07-12 DIAGNOSIS — G4700 Insomnia, unspecified: Secondary | ICD-10-CM

## 2022-07-12 MED ORDER — TRAZODONE HCL 50 MG PO TABS: 50.0000 mg | ORAL_TABLET | Freq: Every day | ORAL | 3 refills | Status: DC

## 2022-07-13 ENCOUNTER — Inpatient Hospital Stay: Payer: 59

## 2022-07-13 VITALS — BP 122/82 | HR 57 | Temp 96.2°F | Resp 18

## 2022-07-13 DIAGNOSIS — C180 Malignant neoplasm of cecum: Secondary | ICD-10-CM

## 2022-07-13 DIAGNOSIS — Z5112 Encounter for antineoplastic immunotherapy: Secondary | ICD-10-CM | POA: Diagnosis not present

## 2022-07-13 DIAGNOSIS — Z95828 Presence of other vascular implants and grafts: Secondary | ICD-10-CM

## 2022-07-13 MED ORDER — SODIUM CHLORIDE 0.9% FLUSH
10.0000 mL | INTRAVENOUS | Status: DC | PRN
  Administered 2022-07-13: 10 mL

## 2022-07-13 MED ORDER — HEPARIN SOD (PORK) LOCK FLUSH 100 UNIT/ML IV SOLN
500.0000 [IU] | Freq: Once | INTRAVENOUS | Status: AC | PRN
  Administered 2022-07-13: 500 [IU]

## 2022-07-13 MED ORDER — PEGFILGRASTIM-CBQV 6 MG/0.6ML ~~LOC~~ SOSY
6.0000 mg | PREFILLED_SYRINGE | Freq: Once | SUBCUTANEOUS | Status: AC
  Administered 2022-07-13: 6 mg via SUBCUTANEOUS
  Filled 2022-07-13: qty 0.6

## 2022-07-13 NOTE — Progress Notes (Signed)
Pt presents today 5FU chemotherapy pump disconnection and Udenyca injection per provider's order. Port flush easily without difficulty with 17mL of NS and 24mL of heparin. Needle removed intact, good blood return noted. No bruising or swelling noted at the site.   5FU chemotherapy pump disconnection and Udenyca injection given today per MD orders. Tolerated infusion without adverse affects. Vital signs stable. No complaints at this time. Discharged from clinic ambulatory in stable condition. Alert and oriented x 3. F/U with [facility] as scheduled.

## 2022-07-13 NOTE — Patient Instructions (Signed)
[facility]  Discharge Instructions: Thank you for choosing [facility] to provide your oncology and hematology care.  If you have a lab appointment with the [facility], please come in thru the Main Entrance and check in at the main information desk.  Wear comfortable clothing and clothing appropriate for easy access to any Portacath or PICC line.   We strive to give you quality time with your provider. You may need to reschedule your appointment if you arrive late (15 or more minutes).  Arriving late affects you and other patients whose appointments are after yours.  Also, if you miss three or more appointments without notifying the office, you may be dismissed from the clinic at the provider's discretion.      For prescription refill requests, have your pharmacy contact our office and allow 72 hours for refills to be completed.    Today you received the following chemotherapy and/or immunotherapy agents Udenyca and pump disconnection.   To help prevent nausea and vomiting after your treatment, we encourage you to take your nausea medication as directed.  BELOW ARE SYMPTOMS THAT SHOULD BE REPORTED IMMEDIATELY: *FEVER GREATER THAN 100.4 F (38 C) OR HIGHER *CHILLS OR SWEATING *NAUSEA AND VOMITING THAT IS NOT CONTROLLED WITH YOUR NAUSEA MEDICATION *UNUSUAL SHORTNESS OF BREATH *UNUSUAL BRUISING OR BLEEDING *URINARY PROBLEMS (pain or burning when urinating, or frequent urination) *BOWEL PROBLEMS (unusual diarrhea, constipation, pain near the anus) TENDERNESS IN MOUTH AND THROAT WITH OR WITHOUT PRESENCE OF ULCERS (sore throat, sores in mouth, or a toothache) UNUSUAL RASH, SWELLING OR PAIN  UNUSUAL VAGINAL DISCHARGE OR ITCHING   Items with * indicate a potential emergency and should be followed up as soon as possible or go to the Emergency Department if any problems should occur.  Please show the CHEMOTHERAPY ALERT CARD or IMMUNOTHERAPY ALERT CARD at  check-in to the Emergency Department and triage nurse.  Should you have questions after your visit or need to cancel or reschedule your appointment, please contact [facility] [phone]  and follow the prompts.  Office hours are 8:00 a.m. to 4:30 p.m. Monday - Friday. Please note that voicemails left after 4:00 p.m. may not be returned until the following business day.  We are closed weekends and major holidays. You have access to a nurse at all times for urgent questions. Please call the main number to the clinic [phone] and follow the prompts.  For any non-urgent questions, you may also contact your provider using MyChart. We now offer e-Visits for anyone 28 and older to request care online for non-urgent symptoms. For details visit [email or web address].   Also download the MyChart app! Go to the app store, search "MyChart", open the app, select [facility], and log in with your MyChart username and password.  Masks are optional in the cancer centers. If you would like for your care team to wear a mask while they are taking care of you, please let them know. For doctor visits, patients may have with them one support person who is at least 65 years old. At this time, visitors are not allowed in the infusion area.

## 2022-07-19 ENCOUNTER — Other Ambulatory Visit: Payer: Self-pay

## 2022-07-24 ENCOUNTER — Other Ambulatory Visit: Payer: Self-pay | Admitting: *Deleted

## 2022-07-24 MED ORDER — HYDROCODONE-ACETAMINOPHEN 5-325 MG PO TABS: 1.0000 | ORAL_TABLET | Freq: Three times a day (TID) | ORAL | 0 refills | Status: DC | PRN

## 2022-07-25 ENCOUNTER — Inpatient Hospital Stay: Payer: 59

## 2022-07-25 ENCOUNTER — Inpatient Hospital Stay: Payer: 59 | Admitting: Hematology

## 2022-07-25 VITALS — BP 125/80 | HR 56 | Temp 96.7°F | Resp 18

## 2022-07-25 DIAGNOSIS — E876 Hypokalemia: Secondary | ICD-10-CM

## 2022-07-25 DIAGNOSIS — C189 Malignant neoplasm of colon, unspecified: Secondary | ICD-10-CM | POA: Diagnosis not present

## 2022-07-25 DIAGNOSIS — Z5112 Encounter for antineoplastic immunotherapy: Secondary | ICD-10-CM | POA: Diagnosis not present

## 2022-07-25 DIAGNOSIS — C180 Malignant neoplasm of cecum: Secondary | ICD-10-CM

## 2022-07-25 DIAGNOSIS — Z95828 Presence of other vascular implants and grafts: Secondary | ICD-10-CM

## 2022-07-25 LAB — CBC WITH DIFFERENTIAL/PLATELET
Abs Immature Granulocytes: 0.64 10*3/uL — ABNORMAL HIGH (ref 0.00–0.07)
Basophils Absolute: 0.1 10*3/uL (ref 0.0–0.1)
Basophils Relative: 1 %
Eosinophils Absolute: 0.2 10*3/uL (ref 0.0–0.5)
Eosinophils Relative: 2 %
HCT: 32.9 % — ABNORMAL LOW (ref 39.0–52.0)
Hemoglobin: 10.6 g/dL — ABNORMAL LOW (ref 13.0–17.0)
Immature Granulocytes: 8 %
Lymphocytes Relative: 22 %
Lymphs Abs: 1.8 10*3/uL (ref 0.7–4.0)
MCH: 31.4 pg (ref 26.0–34.0)
MCHC: 32.2 g/dL (ref 30.0–36.0)
MCV: 97.3 fL (ref 80.0–100.0)
Monocytes Absolute: 1.3 10*3/uL — ABNORMAL HIGH (ref 0.1–1.0)
Monocytes Relative: 16 %
Neutro Abs: 4.1 10*3/uL (ref 1.7–7.7)
Neutrophils Relative %: 51 %
Platelets: 162 10*3/uL (ref 150–400)
RBC: 3.38 MIL/uL — ABNORMAL LOW (ref 4.22–5.81)
RDW: 17.6 % — ABNORMAL HIGH (ref 11.5–15.5)
WBC: 8.2 10*3/uL (ref 4.0–10.5)
nRBC: 0 % (ref 0.0–0.2)

## 2022-07-25 LAB — COMPREHENSIVE METABOLIC PANEL
ALT: 9 U/L (ref 0–44)
AST: 18 U/L (ref 15–41)
Albumin: 3.8 g/dL (ref 3.5–5.0)
Alkaline Phosphatase: 81 U/L (ref 38–126)
Anion gap: 8 (ref 5–15)
BUN: 13 mg/dL (ref 8–23)
CO2: 23 mmol/L (ref 22–32)
Calcium: 8.6 mg/dL — ABNORMAL LOW (ref 8.9–10.3)
Chloride: 109 mmol/L (ref 98–111)
Creatinine, Ser: 1.17 mg/dL (ref 0.61–1.24)
GFR, Estimated: >60 mL/min (ref >60)
Glucose, Bld: 109 mg/dL — ABNORMAL HIGH (ref 70–99)
Potassium: 2.9 mmol/L — ABNORMAL LOW (ref 3.5–5.1)
Sodium: 140 mmol/L (ref 135–145)
Total Bilirubin: 0.3 mg/dL (ref 0.3–1.2)
Total Protein: 7.3 g/dL (ref 6.5–8.1)

## 2022-07-25 LAB — MAGNESIUM: Magnesium: 2.1 mg/dL (ref 1.7–2.4)

## 2022-07-25 MED ORDER — ATROPINE SULFATE 1 MG/ML IV SOLN
0.5000 mg | Freq: Once | INTRAVENOUS | Status: AC
  Administered 2022-07-25: 0.5 mg via INTRAVENOUS

## 2022-07-25 MED ORDER — SODIUM CHLORIDE 0.9 % IV SOLN
10.0000 mg | Freq: Once | INTRAVENOUS | Status: AC
  Administered 2022-07-25: 10 mg via INTRAVENOUS
  Filled 2022-07-25: qty 10

## 2022-07-25 MED ORDER — SODIUM CHLORIDE 0.9 % IV SOLN
5.0000 mg/kg | Freq: Once | INTRAVENOUS | Status: AC
  Administered 2022-07-25: 400 mg via INTRAVENOUS
  Filled 2022-07-25: qty 16

## 2022-07-25 MED ORDER — SODIUM CHLORIDE 0.9 % IV SOLN: Freq: Once | INTRAVENOUS | Status: AC

## 2022-07-25 MED ORDER — SODIUM CHLORIDE 0.9 % IV SOLN
180.0000 mg/m2 | Freq: Once | INTRAVENOUS | Status: AC
  Administered 2022-07-25: 360 mg via INTRAVENOUS
  Filled 2022-07-25: qty 16

## 2022-07-25 MED ORDER — POTASSIUM CHLORIDE CRYS ER 20 MEQ PO TBCR
40.0000 meq | EXTENDED_RELEASE_TABLET | Freq: Once | ORAL | Status: AC
  Administered 2022-07-25: 40 meq via ORAL

## 2022-07-25 MED ORDER — SODIUM CHLORIDE 0.9 % IV SOLN
400.0000 mg/m2 | Freq: Once | INTRAVENOUS | Status: AC
  Administered 2022-07-25: 792 mg via INTRAVENOUS
  Filled 2022-07-25: qty 39.6

## 2022-07-25 MED ORDER — SODIUM CHLORIDE 0.9 % IV SOLN
5000.0000 mg | INTRAVENOUS | Status: DC
  Administered 2022-07-25: 5000 mg via INTRAVENOUS
  Filled 2022-07-25: qty 100

## 2022-07-25 MED ORDER — PALONOSETRON HCL INJECTION 0.25 MG/5ML
0.2500 mg | Freq: Once | INTRAVENOUS | Status: AC
  Administered 2022-07-25: 0.25 mg via INTRAVENOUS

## 2022-07-25 MED ORDER — FLUOROURACIL CHEMO INJECTION 2.5 GM/50ML
400.0000 mg/m2 | Freq: Once | INTRAVENOUS | Status: AC
  Administered 2022-07-25: 800 mg via INTRAVENOUS
  Filled 2022-07-25: qty 16

## 2022-07-25 MED ORDER — SODIUM CHLORIDE 0.9% FLUSH: 10.0000 mL | INTRAVENOUS | Status: DC | PRN

## 2022-07-25 MED ORDER — POTASSIUM CHLORIDE CRYS ER 20 MEQ PO TBCR: 20.0000 meq | EXTENDED_RELEASE_TABLET | Freq: Two times a day (BID) | ORAL | 3 refills | Status: DC

## 2022-07-25 NOTE — Progress Notes (Signed)
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  HER2 negative.  TMB low.  MSI-stable.  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  Other targetable mutations negative.  CURRENT THERAPY: K-ras G12 D mutation.  HER2 negative.  TMB low.  MSI-stable.  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  Other targetable mutations negative.  BRIEF ONCOLOGIC HISTORY:  Oncology History  Cecal cancer ([facility])  10/07/2019 Initial Diagnosis   Cecal cancer ([facility])   10/07/2019 Cancer Staging   Staging form: Colon and Rectum, AJCC 8th Edition - Clinical stage from 10/07/2019: Stage IIIB (cT3, cN1, cM0) - Signed by Derek Jack, MD on 10/07/2019   02/22/2022 -  Chemotherapy   Patient is on Treatment Plan : COLORECTAL FOLFIRI / BEVACIZUMAB Q14D       CANCER STAGING:  Cancer Staging  Cecal cancer ([facility]) Staging form: Colon and Rectum, AJCC 8th Edition - Clinical stage from 10/07/2019: Stage IIIB (cT3, cN1, cM0) - Signed by Derek Jack, MD on 10/07/2019 - Pathologic stage from 01/23/2022: Stage IVB (rpTX, pN0, pM1b) - Unsigned   INTERVAL HISTORY:  [name], a 66 y.o. male, returns for follow-up of for next cycle of chemotherapy with FOLFIRI and bevacizumab.  Lost about 5 pounds in the last 4 weeks.  Reports stable on and off chest pain.  He is taking hydrocodone 2 tablets twice daily.  He had diarrhea twice weekly.  No fevers or infections.  No bleeding issues reported.   REVIEW OF SYSTEMS:  Review of Systems  Constitutional:  Positive for appetite change. Negative for unexpected weight change.  Cardiovascular:  Positive for palpitations. Negative for chest pain.  Gastrointestinal:  Negative for  abdominal pain, constipation and diarrhea.  Neurological:  Negative for headaches.  All other systems reviewed and are negative.   PAST MEDICAL/SURGICAL HISTORY:  Past Medical History:  Diagnosis Date   Arthritis    Colon cancer ([facility])    colon   Hepatitis C    Hypertension    Port-A-Cath in place 02/15/2022   Past Surgical History:  Procedure Laterality Date   COLON SURGERY     PORTACATH PLACEMENT Right 02/08/2022   Procedure: INSERTION PORT-A-CATH- RIJ;  Surgeon: Rusty Aus, DO;  Location: [facility];  Service: General;  Laterality: Right;   REPLACEMENT TOTAL KNEE Left    SHOULDER ARTHROSCOPY Bilateral    TOTAL HIP ARTHROPLASTY Right 11/09/2020   Procedure: TOTAL HIP ARTHROPLASTY ANTERIOR APPROACH;  Surgeon: Renette Butters, MD;  Location: [facility];  Service: Orthopedics;  Laterality: Right;   WRIST SURGERY Left     SOCIAL HISTORY:  Social History   Socioeconomic History   Marital status: Married    Spouse name: Not on file   Number of children: 7   Years of education: Not on file   Highest education level: Not on file  Occupational History   Occupation: employed  Tobacco Use   Smoking status: Former    Packs/day: 1.50    Years: 20.00    Total pack years: 30.00    Types: Cigarettes    Quit date: 11/19/2005    Years since quitting: 16.6   Smokeless tobacco: Never  Vaping Use  Vaping Use: Never used  Substance and Sexual Activity   Alcohol use: Not Currently   Drug use: Never   Sexual activity: Yes  Other Topics Concern   Not on file  Social History Narrative   ** Merged History Encounter **       Separated from wife 11/2021   Social Determinants of Health   Financial Resource Strain: Low Risk  (10/07/2019)   Overall Financial Resource Strain (CARDIA)    Difficulty of Paying Living Expenses: Not very hard  Food Insecurity: No Food Insecurity (10/07/2019)   Hunger Vital Sign    Worried About Running Out of Food in the Last Year: Never true     Ran Out of Food in the Last Year: Never true  Transportation Needs: No Transportation Needs (10/07/2019)   PRAPARE - Hydrologist (Medical): No    Lack of Transportation (Non-Medical): No  Physical Activity: Inactive (10/07/2019)   Exercise Vital Sign    Days of Exercise per Week: 0 days    Minutes of Exercise per Session: 0 min  Stress: No Stress Concern Present (10/07/2019)   [place]    Feeling of Stress : Not at all  Social Connections: Moderately Integrated (10/07/2019)   Social Connection and Isolation Panel [NHANES]    Frequency of Communication with Friends and Family: Once a week    Frequency of Social Gatherings with Friends and Family: Once a week    Attends Religious Services: More than 4 times per year    Active Member of [employer] or Organizations: Yes    Attends Music therapist: More than 4 times per year    Marital Status: Married  Human resources officer Violence: Not At Risk (10/07/2019)   Humiliation, Afraid, Rape, and Kick questionnaire    Fear of Current or Ex-Partner: No    Emotionally Abused: No    Physically Abused: No    Sexually Abused: No    FAMILY HISTORY:  Family History  Problem Relation Age of Onset   Heart disease Mother    Dementia Father    Heart disease Sister    Cancer Brother    Hypertension Brother    Hypertension Brother    Healthy Son    Healthy Son    Healthy Son    Healthy Son    Healthy Daughter    Healthy Daughter    Healthy Daughter     CURRENT MEDICATIONS:  Current Outpatient Medications  Medication Sig Dispense Refill   potassium chloride SA (KLOR-CON M) 20 MEQ tablet Take 1 tablet (20 mEq total) by mouth 2 (two) times daily. 30 tablet 3   aluminum-magnesium hydroxide-simethicone (MAALOX) 814-481-85 MG/5ML SUSP Take 30 mLs by mouth 4 (four) times daily -  before meals and at bedtime. 1680 mL 2   amLODipine (NORVASC) 10 MG  tablet Take 1 tablet (10 mg total) by mouth daily. 90 tablet 3   Bevacizumab (AVASTIN IV) Inject into the vein every 14 (fourteen) days. *start date TBD     fluorouracil CALGB 63149 2,400 mg/m2 in sodium chloride 0.9 % 150 mL Inject 2,400 mg/m2 into the vein over 48 hr.     FLUOROURACIL IV Inject into the vein every 14 (fourteen) days.     HYDROcodone-acetaminophen (NORCO) 5-325 MG tablet Take 1 tablet by mouth every 8 (eight) hours as needed for moderate pain. 84 tablet 0   IRINOTECAN HCL IV Inject into the vein every 14 (  fourteen) days.     Lactulose 20 GM/30ML SOLN Take 15 mLs (10 g total) by mouth at bedtime. Take 15 ml at bedtime every night to assist with regular bowel movements.  Titrate down if having multiple bowel movements.  If a bowel movement has not occurred in 3 to 4 days or longer, then take 15 ml every 3 hours until a bowel movent has occurred. 450 mL 1   LEUCOVORIN CALCIUM IV Inject into the vein every 14 (fourteen) days.     lidocaine-prilocaine (EMLA) cream Apply a small amount to port a cath site (do not rub in) and cover with plastic wrap 1 hour prior to infusion appointments 30 g 3   megestrol (MEGACE) 400 MG/10ML suspension Take 10 mLs (400 mg total) by mouth 2 (two) times daily. 480 mL 3   prochlorperazine (COMPAZINE) 10 MG tablet Take 1 tablet (10 mg total) by mouth every 6 (six) hours as needed (NAUSEA). 30 tablet 1   traZODone (DESYREL) 50 MG tablet Take 1 tablet (50 mg total) by mouth at bedtime. 30 tablet 3   vitamin B-12 (CYANOCOBALAMIN) 1000 MCG tablet Take 1 tablet (1,000 mcg total) by mouth daily. 30 tablet 6   No current facility-administered medications for this visit.   Facility-Administered Medications Ordered in Other Visits  Medication Dose Route Frequency Provider Last Rate Last Admin   fluorouracil (ADRUCIL) 5,000 mg in sodium chloride 0.9 % 150 mL chemo infusion  5,000 mg Intravenous 1 day or 1 dose Derek Jack, MD   Infusion Verify at 07/25/22  1443   sodium chloride flush (NS) 0.9 % injection 10 mL  10 mL Intracatheter PRN Derek Jack, MD        ALLERGIES:  No Known Allergies  PHYSICAL EXAM:  Performance status (ECOG): 0 - Asymptomatic  Vitals:   07/25/22 0924  BP: 134/82  Pulse: 61  Resp: 18  Temp: (!) 96.7 F (35.9 C)  SpO2: 100%   Wt Readings from Last 3 Encounters:  07/25/22 171 lb 12.8 oz (77.9 kg)  07/11/22 176 lb 12.8 oz (80.2 kg)  06/27/22 172 lb (78 kg)   Physical Exam Vitals reviewed.  Constitutional:      Appearance: Normal appearance.  Cardiovascular:     Rate and Rhythm: Normal rate and regular rhythm.     Pulses: Normal pulses.     Heart sounds: Normal heart sounds.  Pulmonary:     Effort: Pulmonary effort is normal.     Breath sounds: Normal breath sounds.  Neurological:     General: No focal deficit present.     Mental Status: He is alert and oriented to person, place, and time.  Psychiatric:        Mood and Affect: Mood normal.        Behavior: Behavior normal.     LABORATORY DATA:  I have reviewed the labs as listed.     Latest Ref Rng & Units 07/25/2022    9:34 AM 07/11/2022    9:51 AM 06/27/2022    8:09 AM  CBC  WBC 4.0 - 10.5 K/uL 8.2  10.7  8.7   Hemoglobin 13.0 - 17.0 g/dL 10.6  10.6  11.1   Hematocrit 39.0 - 52.0 % 32.9  32.7  34.4   Platelets 150 - 400 K/uL 162  167  226       Latest Ref Rng & Units 07/25/2022    9:34 AM 07/11/2022    9:51 AM 06/27/2022    8:09 AM  CMP  Glucose 70 - 99 mg/dL 109  89  99   BUN 8 - 23 mg/dL '13  11  10   ' Creatinine 0.61 - 1.24 mg/dL 1.17  0.91  0.95   Sodium 135 - 145 mmol/L 140  139  138   Potassium 3.5 - 5.1 mmol/L 2.9  3.1  3.6   Chloride 98 - 111 mmol/L 109  111  105   CO2 22 - 32 mmol/L '23  23  26   ' Calcium 8.9 - 10.3 mg/dL 8.6  8.5  8.9   Total Protein 6.5 - 8.1 g/dL 7.3  7.0  7.7   Total Bilirubin 0.3 - 1.2 mg/dL 0.3  0.5  0.6   Alkaline Phos 38 - 126 U/L 81  78  63   AST 15 - 41 U/L '18  18  15   ' ALT 0 - 44 U/L '9  11   8     ' DIAGNOSTIC IMAGING:  I have independently reviewed the scans and discussed with the patient. No results found.   ASSESSMENT:  Left lung mass with multiple lung nodules: - Presentation with dry cough for 6 months. - 30 pound weight loss in the last couple of years, weight stable over the last 6 months. - CT chest with contrast on 12/16/2021 showed bulky left hilar mass measuring 8.1 x 7.5 cm.  Numerous bilateral lung nodules of varying sizes.  Left adrenal nodule measuring 2.8 x 2.2 cm.  Mediastinal and bilateral hilar adenopathy with the largest pretracheal node measuring 4.1 x 3.1 cm. - MRI of the brain from 01/04/2022 which was negative for metastatic disease. - CTAP from 01/03/2022 which showed isolated left adrenal metastasis with no other evidence of metastatic disease in the abdomen or pelvis. - Pathology of left lung biopsy which shows adenocarcinoma with necrosis.  CK20 positive and CDX2 positive but negative for CK7 indicating colonic primary. 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  HER2 negative.  TMB low.  MSI-stable.  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  Other targetable mutations negative. - FOLFIRI started on 02/22/2022, bevacizumab added with cycle 4  - CT CAP (05/17/2022): Mediastinal and hilar lymph nodes have decreased in size.  Largest perihilar left lower lobe lung mass has decreased in size.  Right upper lobe mass slightly increased in size.  Other nodules are stable.  Left adrenal mass decreased to 1.3 cm from 2.8 cm. - CT scan showed mixed response as it was compared to CT from 12/16/2021.  He did not start chemotherapy until 02/22/2022.  Social/family history: - Lives by himself.  He paints houses for living. - He quit smoking 12 years back and started back again 1 and half year ago and smoked half pack per day.  He quit again about 1 week ago. - Father had cancer, type unknown to the patient.  Brother died of brain tumor.  3.  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  Margins negative.  No lymphovascular or perineural invasion. - Received 3 cycles of XELOX followed by Xeloda for total of 6 months.  Oxaliplatin discontinued during cycle 4 secondary to transaminitis, elevated bilirubin and thrombocytopenia.  However he was also treated for hep C with Harvoni after that.   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  Did not start Megace.  However started  using Maalox with lidocaine which is helping him eat better. - Reviewed labs today which showed normal LFTs.  Severe hypokalemia noted which will be corrected.  CBC was grossly normal.  Last CEA was 11.5.  Proceed with cycle 11 today.  RTC 2 weeks.  We will arrange for CT CAP and CEA level.  2.  Lower rib/epigastric pain: - Throbbing pain in the sternal region stable.      - Continue hydrocodone 5/325 2 tablets twice daily.  3.  Hypokalemia: - We will start him on potassium 20 mEq daily.  4.  Difficulty falling asleep: - Can continue trazodone 50 mg daily.  5.  Hypertension: - Continue Norvasc 10 mg daily.  Blood pressure is well controlled.   Orders placed this encounter:  No orders of the defined types were placed in this encounter.    Derek Jack, MD [place] [phone]   I, Thana Ates, am acting as a scribe for Dr. Derek Jack.  I, Derek Jack MD, have reviewed the above documentation for accuracy and completeness, and I agree with the above.

## 2022-07-25 NOTE — Progress Notes (Signed)
Patients port flushed without difficulty.  Good blood return noted with no bruising or swelling noted at site.  Stable during access and blood draw.  Patient to remain accessed for treatment. 

## 2022-07-25 NOTE — Patient Instructions (Signed)
[facility]  Discharge Instructions: Thank you for choosing [facility] to provide your oncology and hematology care.  If you have a lab appointment with the [facility], please come in thru the Main Entrance and check in at the main information desk.  Wear comfortable clothing and clothing appropriate for easy access to any Portacath or PICC line.   We strive to give you quality time with your provider. You may need to reschedule your appointment if you arrive late (15 or more minutes).  Arriving late affects you and other patients whose appointments are after yours.  Also, if you miss three or more appointments without notifying the office, you may be dismissed from the clinic at the provider's discretion.      For prescription refill requests, have your pharmacy contact our office and allow 72 hours for refills to be completed.    Today you received the following chemotherapy and/or immunotherapy agents irinotecan, leucovorin, adruicil, and avastin.   Irinotecan Injection What is this medication? IRINOTECAN (ir in oh TEE kan) treats some types of cancer. It works by slowing down the growth of cancer cells. This medicine may be used for other purposes; ask your health care provider or pharmacist if you have questions. COMMON BRAND NAME(S): Camptosar What should I tell my care team before I take this medication? They need to know if you have any of these conditions: Dehydration Diarrhea Infection, especially a viral infection, such as chickenpox, cold sores, herpes Liver disease Low blood cell levels (white cells, red cells, and platelets) Low levels of electrolytes, such as calcium, magnesium, or potassium in your blood Recent or ongoing radiation An unusual or allergic reaction to irinotecan, other medications, foods, dyes, or preservatives If you or your partner are pregnant or trying to get pregnant Breast-feeding How should I use this  medication? This medication is injected into a vein. It is given by your care team in a hospital or clinic setting. Talk to your care team about the use of this medication in children. Special care may be needed. Overdosage: If you think you have taken too much of this medicine contact a poison control center or emergency room at once. NOTE: This medicine is only for you. Do not share this medicine with others. What if I miss a dose? Keep appointments for follow-up doses. It is important not to miss your dose. Call your care team if you are unable to keep an appointment. What may interact with this medication? Do not take this medication with any of the following: Cobicistat Itraconazole This medication may also interact with the following: Certain antibiotics, such as clarithromycin, rifampin, rifabutin Certain antivirals for HIV or AIDS Certain medications for fungal infections, such as ketoconazole, posaconazole, voriconazole Certain medications for seizures, such as carbamazepine, phenobarbital, phenytoin Gemfibrozil Nefazodone St. John's wort This list may not describe all possible interactions. Give your health care provider a list of all the medicines, herbs, non-prescription drugs, or dietary supplements you use. Also tell them if you smoke, drink alcohol, or use illegal drugs. Some items may interact with your medicine. What should I watch for while using this medication? Your condition will be monitored carefully while you are receiving this medication. You may need blood work while taking this medication. This medication may make you feel generally unwell. This is not uncommon as chemotherapy can affect healthy cells as well as cancer cells. Report any side effects. Continue your course of treatment even though you feel  ill unless your care team tells you to stop. This medication can cause serious side effects. To reduce the risk, your care team may give you other medications to  take before receiving this one. Be sure to follow the directions from your care team. This medication may affect your coordination, reaction time, or judgement. Do not drive or operate machinery until you know how this medication affects you. Sit up or stand slowly to reduce the risk of dizzy or fainting spells. Drinking alcohol with this medication can increase the risk of these side effects. This medication may increase your risk of getting an infection. Call your care team for advice if you get a fever, chills, sore throat, or other symptoms of a cold or flu. Do not treat yourself. Try to avoid being around people who are sick. Avoid taking medications that contain aspirin, acetaminophen, ibuprofen, naproxen, or ketoprofen unless instructed by your care team. These medications may hide a fever. This medication may increase your risk to bruise or bleed. Call your care team if you notice any unusual bleeding. Be careful brushing or flossing your teeth or using a toothpick because you may get an infection or bleed more easily. If you have any dental work done, tell your dentist you are receiving this medication. Talk to your care team if you or your partner are pregnant or think either of you might be pregnant. This medication can cause serious birth defects if taken during pregnancy and for 6 months after the last dose. You will need a negative pregnancy test before starting this medication. Contraception is recommended while taking this medication and for 6 months after the last dose. Your care team can help you find the option that works for you. Do not father a child while taking this medication and for 3 months after the last dose. Use a condom for contraception during this time period. Do not breastfeed while taking this medication and for 7 days after the last dose. This medication may cause infertility. Talk to your care team if you are concerned about your fertility. What side effects may I notice  from receiving this medication? Side effects that you should report to your care team as soon as possible: Allergic reactions--skin rash, itching, hives, swelling of the face, lips, tongue, or throat Dry cough, shortness of breath or trouble breathing Increased saliva or tears, increased sweating, stomach cramping, diarrhea, small pupils, unusual weakness or fatigue, slow heartbeat Infection--fever, chills, cough, sore throat, wounds that don't heal, pain or trouble when passing urine, general feeling of discomfort or being unwell Kidney injury--decrease in the amount of urine, swelling of the ankles, hands, or feet Low red blood cell level--unusual weakness or fatigue, dizziness, headache, trouble breathing Severe or prolonged diarrhea Unusual bruising or bleeding Side effects that usually do not require medical attention (report to your care team if they continue or are bothersome): Constipation Diarrhea Hair loss Loss of appetite Nausea Stomach pain This list may not describe all possible side effects. Call your doctor for medical advice about side effects. You may report side effects to FDA at 1-800-FDA-1088. Where should I keep my medication? This medication is given in a hospital or clinic. It will not be stored at home. NOTE: This sheet is a summary. It may not cover all possible information. If you have questions about this medicine, talk to your doctor, pharmacist, or health care provider.  2023 Elsevier/Gold Standard (2022-04-06 00:00:00)       To help prevent nausea and  vomiting after your treatment, we encourage you to take your nausea medication as directed.  BELOW ARE SYMPTOMS THAT SHOULD BE REPORTED IMMEDIATELY: *FEVER GREATER THAN 100.4 F (38 C) OR HIGHER *CHILLS OR SWEATING *NAUSEA AND VOMITING THAT IS NOT CONTROLLED WITH YOUR NAUSEA MEDICATION *UNUSUAL SHORTNESS OF BREATH *UNUSUAL BRUISING OR BLEEDING *URINARY PROBLEMS (pain or burning when urinating, or frequent  urination) *BOWEL PROBLEMS (unusual diarrhea, constipation, pain near the anus) TENDERNESS IN MOUTH AND THROAT WITH OR WITHOUT PRESENCE OF ULCERS (sore throat, sores in mouth, or a toothache) UNUSUAL RASH, SWELLING OR PAIN  UNUSUAL VAGINAL DISCHARGE OR ITCHING   Items with * indicate a potential emergency and should be followed up as soon as possible or go to the Emergency Department if any problems should occur.  Please show the CHEMOTHERAPY ALERT CARD or IMMUNOTHERAPY ALERT CARD at check-in to the Emergency Department and triage nurse.  Should you have questions after your visit or need to cancel or reschedule your appointment, please contact [facility] [phone]  and follow the prompts.  Office hours are 8:00 a.m. to 4:30 p.m. Monday - Friday. Please note that voicemails left after 4:00 p.m. may not be returned until the following business day.  We are closed weekends and major holidays. You have access to a nurse at all times for urgent questions. Please call the main number to the clinic [phone] and follow the prompts.  For any non-urgent questions, you may also contact your provider using MyChart. We now offer e-Visits for anyone 62 and older to request care online for non-urgent symptoms. For details visit [email or web address].   Also download the MyChart app! Go to the app store, search "MyChart", open the app, select [facility], and log in with your MyChart username and password.  Masks are optional in the cancer centers. If you would like for your care team to wear a mask while they are taking care of you, please let them know. For doctor visits, patients may have with them one support person who is at least 65 years old. At this time, visitors are not allowed in the infusion area.

## 2022-07-25 NOTE — Patient Instructions (Addendum)
[facility] at [facility] Discharge Instructions   You were seen and examined today by Dr. Delton Coombes.  He reviewed the results of your lab work which are normal/stable except your potassium is low. We will give you potassium pills in the clinic today.   We will proceed with your treatment today.  We will repeat a CT scan prior to your next treatment.  Return as scheduled.    Thank you for choosing [place] at [facility] to provide your oncology and hematology care.  To afford each patient quality time with our provider, please arrive at least 15 minutes before your scheduled appointment time.   If you have a lab appointment with the [facility] please come in thru the Main Entrance and check in at the main information desk.  You need to re-schedule your appointment should you arrive 10 or more minutes late.  We strive to give you quality time with our providers, and arriving late affects you and other patients whose appointments are after yours.  Also, if you no show three or more times for appointments you may be dismissed from the clinic at the providers discretion.     Again, thank you for choosing [facility].  Our hope is that these requests will decrease the amount of time that you wait before being seen by our physicians.       _____________________________________________________________  Should you have questions after your visit to [facility], please contact our office at [phone] and follow the prompts.  Our office hours are 8:00 a.m. and 4:30 p.m. Monday - Friday.  Please note that voicemails left after 4:00 p.m. may not be returned until the following business day.  We are closed weekends and major holidays.  You do have access to a nurse 24-7, just call the main number to the clinic [phone] and do not press any options, hold on the line and a nurse will answer the phone.    For  prescription refill requests, have your pharmacy contact our office and allow 72 hours.    Due to Covid, you will need to wear a mask upon entering the hospital. If you do not have a mask, a mask will be given to you at the Main Entrance upon arrival. For doctor visits, patients may have 1 support person age 44 or older with them. For treatment visits, patients can not have anyone with them due to social distancing guidelines and our immunocompromised population.

## 2022-07-25 NOTE — Progress Notes (Signed)
Patient tolerated chemotherapy with no complaints voiced.  Side effects with management reviewed with understanding verbalized.  Port site clean and dry with no bruising or swelling noted at site.  Good blood return noted before and after administration of chemotherapy.  Chemotherapy pump connected with alarms checked.  Dressing intact.  Patient left in satisfactory condition with VSS and no s/s of distress noted.

## 2022-07-26 LAB — CEA: CEA: 13.9 ng/mL — ABNORMAL HIGH (ref 0.0–4.7)

## 2022-07-27 ENCOUNTER — Inpatient Hospital Stay: Payer: 59

## 2022-07-27 VITALS — BP 111/71 | HR 65 | Temp 98.4°F | Resp 18

## 2022-07-27 DIAGNOSIS — Z5112 Encounter for antineoplastic immunotherapy: Secondary | ICD-10-CM | POA: Diagnosis not present

## 2022-07-27 DIAGNOSIS — Z95828 Presence of other vascular implants and grafts: Secondary | ICD-10-CM

## 2022-07-27 DIAGNOSIS — C180 Malignant neoplasm of cecum: Secondary | ICD-10-CM

## 2022-07-27 MED ORDER — SODIUM CHLORIDE 0.9% FLUSH
10.0000 mL | INTRAVENOUS | Status: DC | PRN
  Administered 2022-07-27: 10 mL

## 2022-07-27 MED ORDER — PEGFILGRASTIM-CBQV 6 MG/0.6ML ~~LOC~~ SOSY
6.0000 mg | PREFILLED_SYRINGE | Freq: Once | SUBCUTANEOUS | Status: AC
  Administered 2022-07-27: 6 mg via SUBCUTANEOUS
  Filled 2022-07-27: qty 0.6

## 2022-07-27 MED ORDER — HEPARIN SOD (PORK) LOCK FLUSH 100 UNIT/ML IV SOLN
500.0000 [IU] | Freq: Once | INTRAVENOUS | Status: AC | PRN
  Administered 2022-07-27: 500 [IU]

## 2022-07-27 NOTE — Progress Notes (Signed)
Patient presents today for pump d/c and injection. When RN went to remove dressing, dressing that was placed on 8/15 had been replaced with a bigger bandage by patient, patient reports sweating so much that the bandage was coming off. Patient was educated on infection precautions and advised to apply new dressing over an existing dressing before original dressing comes off. Patient verbalizes understanding. Port flushed with good blood return noted. No bruising or swelling at site. Bandaid applied and patient discharged in satisfactory condition. VVS stable with no signs or symptoms of distressed noted.  Patient tolerated injection with no complaints voiced. Site clean and dry with no bruising or swelling noted at site. See MAR for details. Band aid applied.  Patient stable during and after injection. VSS with discharge and left in satisfactory condition with no s/s of distress noted.

## 2022-08-03 ENCOUNTER — Ambulatory Visit (HOSPITAL_COMMUNITY): Payer: 59

## 2022-08-07 ENCOUNTER — Ambulatory Visit (HOSPITAL_COMMUNITY)
Admission: RE | Admit: 2022-08-07 | Discharge: 2022-08-07 | Disposition: A | Discharge status: Home / Self Care | Payer: 59 | Source: Ambulatory Visit | Attending: Hematology | Admitting: Hematology

## 2022-08-07 DIAGNOSIS — C189 Malignant neoplasm of colon, unspecified: Secondary | ICD-10-CM | POA: Diagnosis present

## 2022-08-07 MED ORDER — IOHEXOL 300 MG/ML  SOLN
100.0000 mL | Freq: Once | INTRAMUSCULAR | Status: AC | PRN
  Administered 2022-08-07: 100 mL via INTRAVENOUS

## 2022-08-08 ENCOUNTER — Inpatient Hospital Stay: Payer: 59

## 2022-08-08 ENCOUNTER — Inpatient Hospital Stay (HOSPITAL_BASED_OUTPATIENT_CLINIC_OR_DEPARTMENT_OTHER): Payer: 59 | Admitting: Hematology

## 2022-08-08 ENCOUNTER — Other Ambulatory Visit: Payer: Self-pay | Admitting: *Deleted

## 2022-08-08 VITALS — BP 128/84 | HR 65 | Temp 98.8°F | Resp 18

## 2022-08-08 DIAGNOSIS — C180 Malignant neoplasm of cecum: Secondary | ICD-10-CM

## 2022-08-08 DIAGNOSIS — Z95828 Presence of other vascular implants and grafts: Secondary | ICD-10-CM

## 2022-08-08 DIAGNOSIS — Z5112 Encounter for antineoplastic immunotherapy: Secondary | ICD-10-CM | POA: Diagnosis not present

## 2022-08-08 DIAGNOSIS — C189 Malignant neoplasm of colon, unspecified: Secondary | ICD-10-CM

## 2022-08-08 LAB — CBC WITH DIFFERENTIAL/PLATELET
Abs Immature Granulocytes: 0 10*3/uL (ref 0.00–0.07)
Basophils Absolute: 0 10*3/uL (ref 0.0–0.1)
Basophils Relative: 0 %
Eosinophils Absolute: 0.3 10*3/uL (ref 0.0–0.5)
Eosinophils Relative: 3 %
HCT: 32.7 % — ABNORMAL LOW (ref 39.0–52.0)
Hemoglobin: 10.8 g/dL — ABNORMAL LOW (ref 13.0–17.0)
Lymphocytes Relative: 32 %
Lymphs Abs: 3.6 10*3/uL (ref 0.7–4.0)
MCH: 31.3 pg (ref 26.0–34.0)
MCHC: 33 g/dL (ref 30.0–36.0)
MCV: 94.8 fL (ref 80.0–100.0)
Monocytes Absolute: 1.3 10*3/uL — ABNORMAL HIGH (ref 0.1–1.0)
Monocytes Relative: 11 %
Neutro Abs: 6.2 10*3/uL (ref 1.7–7.7)
Neutrophils Relative %: 54 %
Platelets: 270 10*3/uL (ref 150–400)
RBC: 3.45 MIL/uL — ABNORMAL LOW (ref 4.22–5.81)
RDW: 16.4 % — ABNORMAL HIGH (ref 11.5–15.5)
WBC: 11.4 10*3/uL — ABNORMAL HIGH (ref 4.0–10.5)
nRBC: 0 % (ref 0.0–0.2)

## 2022-08-08 LAB — COMPREHENSIVE METABOLIC PANEL
ALT: 10 U/L (ref 0–44)
AST: 16 U/L (ref 15–41)
Albumin: 3.8 g/dL (ref 3.5–5.0)
Alkaline Phosphatase: 65 U/L (ref 38–126)
Anion gap: 9 (ref 5–15)
BUN: 9 mg/dL (ref 8–23)
CO2: 21 mmol/L — ABNORMAL LOW (ref 22–32)
Calcium: 9 mg/dL (ref 8.9–10.3)
Chloride: 104 mmol/L (ref 98–111)
Creatinine, Ser: 0.98 mg/dL (ref 0.61–1.24)
GFR, Estimated: >60 mL/min (ref >60)
Glucose, Bld: 104 mg/dL — ABNORMAL HIGH (ref 70–99)
Potassium: 3.6 mmol/L (ref 3.5–5.1)
Sodium: 134 mmol/L — ABNORMAL LOW (ref 135–145)
Total Bilirubin: 0.5 mg/dL (ref 0.3–1.2)
Total Protein: 7.9 g/dL (ref 6.5–8.1)

## 2022-08-08 LAB — MAGNESIUM: Magnesium: 2 mg/dL (ref 1.7–2.4)

## 2022-08-08 MED ORDER — SODIUM CHLORIDE 0.9 % IV SOLN
5.0000 mg/kg | Freq: Once | INTRAVENOUS | Status: AC
  Administered 2022-08-08: 400 mg via INTRAVENOUS
  Filled 2022-08-08: qty 16

## 2022-08-08 MED ORDER — SODIUM CHLORIDE 0.9 % IV SOLN: Freq: Once | INTRAVENOUS | Status: AC

## 2022-08-08 MED ORDER — SODIUM CHLORIDE 0.9 % IV SOLN
5000.0000 mg | INTRAVENOUS | Status: DC
  Administered 2022-08-08: 5000 mg via INTRAVENOUS
  Filled 2022-08-08: qty 100

## 2022-08-08 MED ORDER — SODIUM CHLORIDE 0.9% FLUSH: 10.0000 mL | INTRAVENOUS | Status: DC | PRN

## 2022-08-08 MED ORDER — FLUOROURACIL CHEMO INJECTION 2.5 GM/50ML
400.0000 mg/m2 | Freq: Once | INTRAVENOUS | Status: AC
  Administered 2022-08-08: 800 mg via INTRAVENOUS
  Filled 2022-08-08: qty 16

## 2022-08-08 MED ORDER — OXYCODONE HCL 10 MG PO TABS: 10.0000 mg | ORAL_TABLET | Freq: Four times a day (QID) | ORAL | 0 refills | Status: DC | PRN

## 2022-08-08 MED ORDER — SODIUM CHLORIDE 0.9 % IV SOLN
400.0000 mg/m2 | Freq: Once | INTRAVENOUS | Status: AC
  Administered 2022-08-08: 792 mg via INTRAVENOUS
  Filled 2022-08-08: qty 39.6

## 2022-08-08 MED ORDER — SODIUM CHLORIDE 0.9 % IV SOLN
180.0000 mg/m2 | Freq: Once | INTRAVENOUS | Status: AC
  Administered 2022-08-08: 360 mg via INTRAVENOUS
  Filled 2022-08-08: qty 15

## 2022-08-08 MED ORDER — PALONOSETRON HCL INJECTION 0.25 MG/5ML
0.2500 mg | Freq: Once | INTRAVENOUS | Status: AC
  Administered 2022-08-08: 0.25 mg via INTRAVENOUS
  Filled 2022-08-08: qty 5

## 2022-08-08 MED ORDER — ATROPINE SULFATE 1 MG/ML IV SOLN
0.5000 mg | Freq: Once | INTRAVENOUS | Status: AC | PRN
  Administered 2022-08-08: 0.5 mg via INTRAVENOUS
  Filled 2022-08-08: qty 1

## 2022-08-08 MED ORDER — SODIUM CHLORIDE 0.9 % IV SOLN
10.0000 mg | Freq: Once | INTRAVENOUS | Status: AC
  Administered 2022-08-08: 10 mg via INTRAVENOUS
  Filled 2022-08-08: qty 10

## 2022-08-08 NOTE — Progress Notes (Signed)
ON PATH[facility] - Colorectal  No Change  Continue With Treatment as Ordered.  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

## 2022-08-08 NOTE — Progress Notes (Signed)
Patient has been examined by Dr. Katragadda, and vital signs and labs have been reviewed. ANC, Creatinine, LFTs, hemoglobin, and platelets are within treatment parameters per M.D. - pt may proceed with treatment.  Primary RN and pharmacy notified.  

## 2022-08-08 NOTE — Progress Notes (Signed)
Urinalysis to be done next visit per Dr. Delton Coombes.    Patient tolerated chemotherapy with no complaints voiced.  Side effects with management reviewed with understanding verbalized.  Port site clean and dry with no bruising or swelling noted at site.  Good blood return noted before and after administration of chemotherapy.  Chemotherapy pump connected with no alarms noted and the word RUN seen.  Dressing intact.  Patient left in satisfactory condition with VSS and no s/s of distress noted.

## 2022-08-08 NOTE — Patient Instructions (Addendum)
[place] at [facility] Discharge Instructions   You were seen and examined today by Dr. Delton Coombes.  He reviewed the results of your CT scan which shows the cancer has responded to treatment and has gotten smaller.   He reviewed the results of your lab work which are normal/stable.   We will proceed with your treatment today. After today's cycle, we will plan to start you on maintenance therapy - we will take away the chemotherapy (irinotecan) and just give you Avastin (monoclonal antibody) and 5FU (the medicine you take home on the pump).   Return as scheduled.   Thank you for choosing [place] at [facility] to provide your oncology and hematology care.  To afford each patient quality time with our provider, please arrive at least 15 minutes before your scheduled appointment time.   If you have a lab appointment with the [facility] please come in thru the Main Entrance and check in at the main information desk.  You need to re-schedule your appointment should you arrive 10 or more minutes late.  We strive to give you quality time with our providers, and arriving late affects you and other patients whose appointments are after yours.  Also, if you no show three or more times for appointments you may be dismissed from the clinic at the providers discretion.     Again, thank you for choosing [facility]  Our hope is that these requests will decrease the amount of time that you wait before being seen by our physicians.       _____________________________________________________________  Should you have questions after your visit to [facility], please contact our office at [phone] and follow the prompts.  Our office hours are 8:00 a.m. and 4:30 p.m. Monday - Friday.  Please note that voicemails left after 4:00 p.m. may not be returned until the following business day.  We are closed weekends and major  holidays.  You do have access to a nurse 24-7, just call the main number to the clinic [phone] and do not press any options, hold on the line and a nurse will answer the phone.    For prescription refill requests, have your pharmacy contact our office and allow 72 hours.    Due to Covid, you will need to wear a mask upon entering the hospital. If you do not have a mask, a mask will be given to you at the Main Entrance upon arrival. For doctor visits, patients may have 1 support person age 8 or older with them. For treatment visits, patients can not have anyone with them due to social distancing guidelines and our immunocompromised population.

## 2022-08-08 NOTE — Patient Instructions (Signed)
[place]  Discharge Instructions: Thank you for choosing [facility] to provide your oncology and hematology care.  If you have a lab appointment with the [facility], please come in thru the Main Entrance and check in at the main information desk.  Wear comfortable clothing and clothing appropriate for easy access to any Portacath or PICC line.   We strive to give you quality time with your provider. You may need to reschedule your appointment if you arrive late (15 or more minutes).  Arriving late affects you and other patients whose appointments are after yours.  Also, if you miss three or more appointments without notifying the office, you may be dismissed from the clinic at the provider's discretion.      For prescription refill requests, have your pharmacy contact our office and allow 72 hours for refills to be completed.    Today you received the following chemotherapy and/or immunotherapy agents avastin, irinotecan, leucovorin, adruicil.        To help prevent nausea and vomiting after your treatment, we encourage you to take your nausea medication as directed.  BELOW ARE SYMPTOMS THAT SHOULD BE REPORTED IMMEDIATELY: *FEVER GREATER THAN 100.4 F (38 C) OR HIGHER *CHILLS OR SWEATING *NAUSEA AND VOMITING THAT IS NOT CONTROLLED WITH YOUR NAUSEA MEDICATION *UNUSUAL SHORTNESS OF BREATH *UNUSUAL BRUISING OR BLEEDING *URINARY PROBLEMS (pain or burning when urinating, or frequent urination) *BOWEL PROBLEMS (unusual diarrhea, constipation, pain near the anus) TENDERNESS IN MOUTH AND THROAT WITH OR WITHOUT PRESENCE OF ULCERS (sore throat, sores in mouth, or a toothache) UNUSUAL RASH, SWELLING OR PAIN  UNUSUAL VAGINAL DISCHARGE OR ITCHING   Items with * indicate a potential emergency and should be followed up as soon as possible or go to the Emergency Department if any problems should occur.  Please show the CHEMOTHERAPY ALERT CARD or IMMUNOTHERAPY  ALERT CARD at check-in to the Emergency Department and triage nurse.  Should you have questions after your visit or need to cancel or reschedule your appointment, please contact [facility] [phone]  and follow the prompts.  Office hours are 8:00 a.m. to 4:30 p.m. Monday - Friday. Please note that voicemails left after 4:00 p.m. may not be returned until the following business day.  We are closed weekends and major holidays. You have access to a nurse at all times for urgent questions. Please call the main number to the clinic [phone] and follow the prompts.  For any non-urgent questions, you may also contact your provider using MyChart. We now offer e-Visits for anyone 34 and older to request care online for non-urgent symptoms. For details visit [email or web address].   Also download the MyChart app! Go to the app store, search "MyChart", open the app, select [facility], and log in with your MyChart username and password.  Masks are optional in the cancer centers. If you would like for your care team to wear a mask while they are taking care of you, please let them know. You may have one support person who is at least 65 years old accompany you for your appointments.

## 2022-08-08 NOTE — Progress Notes (Signed)
Ok to get UP with next treatment per Dr. Raliegh Ip

## 2022-08-08 NOTE — Progress Notes (Signed)
[name], [place] [address]   CLINIC:  Medical Oncology/Hematology  PCP:  Evan Lemons, PA [address] 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  HER2 negative.  TMB low.  MSI-stable.  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  Other targetable mutations negative.  CURRENT THERAPY: K-ras G12 D mutation.  HER2 negative.  TMB low.  MSI-stable.  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  Other targetable mutations negative.  BRIEF ONCOLOGIC HISTORY:  Oncology History  Cecal cancer ([facility])  10/07/2019 Initial Diagnosis   Cecal cancer ([facility])   10/07/2019 Cancer Staging   Staging form: Colon and Rectum, AJCC 8th Edition - Clinical stage from 10/07/2019: Stage IIIB (cT3, cN1, cM0) - Signed by Evan Jack, MD on 10/07/2019   02/22/2022 -  Chemotherapy   Patient is on Treatment Plan : COLORECTAL FOLFIRI / BEVACIZUMAB Q14D       CANCER STAGING:  Cancer Staging  Cecal cancer ([facility]) Staging form: Colon and Rectum, AJCC 8th Edition - Clinical stage from 10/07/2019: Stage IIIB (cT3, cN1, cM0) - Signed by Evan Jack, MD on 10/07/2019 - Pathologic stage from 01/23/2022: Stage IVB (rpTX, pN0, pM1b) - Unsigned   INTERVAL HISTORY:  [name], a 66 y.o. male, returns for follow-up and cycle 12 of FOLFIRI and bevacizumab.  He reports that sternal pain is stable and rates it 3 out of 10.  Energy levels are 90%.  He lost about 2 pounds in the last 2 weeks.  In the first week after chemotherapy he does not have any appetite.  Second week he eats better.  He is taking 3 hydrocodone 5 mg tablets 2-3 times daily as pain is poorly controlled.  REVIEW OF SYSTEMS:  Review of Systems  Constitutional:  Negative for appetite change and unexpected weight change.  Cardiovascular:  Positive for chest pain  (Sternal pain). Negative for palpitations.  Gastrointestinal:  Positive for diarrhea and nausea. Negative for abdominal pain and constipation.  Neurological:  Negative for headaches.  Psychiatric/Behavioral:  Positive for sleep disturbance.   All other systems reviewed and are negative.   PAST MEDICAL/SURGICAL HISTORY:  Past Medical History:  Diagnosis Date   Arthritis    Colon cancer ([facility])    colon   Hepatitis C    Hypertension    Port-A-Cath in place 02/15/2022   Past Surgical History:  Procedure Laterality Date   COLON SURGERY     PORTACATH PLACEMENT Right 02/08/2022   Procedure: INSERTION PORT-A-CATH- RIJ;  Surgeon: Evan Aus, DO;  Location: [facility];  Service: General;  Laterality: Right;   REPLACEMENT TOTAL KNEE Left    SHOULDER ARTHROSCOPY Bilateral    TOTAL HIP ARTHROPLASTY Right 11/09/2020   Procedure: TOTAL HIP ARTHROPLASTY ANTERIOR APPROACH;  Surgeon: Evan Butters, MD;  Location: [facility];  Service: Orthopedics;  Laterality: Right;   WRIST SURGERY Left     SOCIAL HISTORY:  Social History   Socioeconomic History   Marital status: Married    Spouse name: Not on file   Number of children: 7   Years of education: Not on file   Highest education level: Not on file  Occupational History   Occupation: employed  Tobacco Use   Smoking status: Former    Packs/day: 1.50    Years: 20.00  Total pack years: 30.00    Types: Cigarettes    Quit date: 11/19/2005    Years since quitting: 16.7   Smokeless tobacco: Never  Vaping Use   Vaping Use: Never used  Substance and Sexual Activity   Alcohol use: Not Currently   Drug use: Never   Sexual activity: Yes  Other Topics Concern   Not on file  Social History Narrative   ** Merged History Encounter **       Separated from wife 11/2021   Social Determinants of Health   Financial Resource Strain: Low Risk  (10/07/2019)   Overall Financial Resource Strain (CARDIA)    Difficulty of Paying Living  Expenses: Not very hard  Food Insecurity: No Food Insecurity (10/07/2019)   Hunger Vital Sign    Worried About Running Out of Food in the Last Year: Never true    Ran Out of Food in the Last Year: Never true  Transportation Needs: No Transportation Needs (10/07/2019)   PRAPARE - Hydrologist (Medical): No    Lack of Transportation (Non-Medical): No  Physical Activity: Inactive (10/07/2019)   Exercise Vital Sign    Days of Exercise per Week: 0 days    Minutes of Exercise per Session: 0 min  Stress: No Stress Concern Present (10/07/2019)   [place]    Feeling of Stress : Not at all  Social Connections: Moderately Integrated (10/07/2019)   Social Connection and Isolation Panel [NHANES]    Frequency of Communication with Friends and Family: Once a week    Frequency of Social Gatherings with Friends and Family: Once a week    Attends Religious Services: More than 4 times per year    Active Member of [employer] or Organizations: Yes    Attends Music therapist: More than 4 times per year    Marital Status: Married  Human resources officer Violence: Not At Risk (10/07/2019)   Humiliation, Afraid, Rape, and Kick questionnaire    Fear of Current or Ex-Partner: No    Emotionally Abused: No    Physically Abused: No    Sexually Abused: No    FAMILY HISTORY:  Family History  Problem Relation Age of Onset   Heart disease Mother    Dementia Father    Heart disease Sister    Cancer Brother    Hypertension Brother    Hypertension Brother    Healthy Son    Healthy Son    Healthy Son    Healthy Son    Healthy Daughter    Healthy Daughter    Healthy Daughter     CURRENT MEDICATIONS:  Current Outpatient Medications  Medication Sig Dispense Refill   aluminum-magnesium hydroxide-simethicone (MAALOX) [phone] MG/5ML SUSP Take 30 mLs by mouth 4 (four) times daily -  before meals and at  bedtime. 1680 mL 2   amLODipine (NORVASC) 10 MG tablet Take 1 tablet (10 mg total) by mouth daily. 90 tablet 3   Bevacizumab (AVASTIN IV) Inject into the vein every 14 (fourteen) days. *start date TBD     fluorouracil CALGB 54098 2,400 mg/m2 in sodium chloride 0.9 % 150 mL Inject 2,400 mg/m2 into the vein over 48 hr.     FLUOROURACIL IV Inject into the vein every 14 (fourteen) days.     HYDROcodone-acetaminophen (NORCO) 5-325 MG tablet Take 1 tablet by mouth every 8 (eight) hours as needed for moderate pain. 84 tablet 0   IRINOTECAN HCL  IV Inject into the vein every 14 (fourteen) days.     Lactulose 20 GM/30ML SOLN Take 15 mLs (10 g total) by mouth at bedtime. Take 15 ml at bedtime every night to assist with regular bowel movements.  Titrate down if having multiple bowel movements.  If a bowel movement has not occurred in 3 to 4 days or longer, then take 15 ml every 3 hours until a bowel movent has occurred. 450 mL 1   LEUCOVORIN CALCIUM IV Inject into the vein every 14 (fourteen) days.     lidocaine-prilocaine (EMLA) cream Apply a small amount to port a cath site (do not rub in) and cover with plastic wrap 1 hour prior to infusion appointments 30 g 3   megestrol (MEGACE) 400 MG/10ML suspension Take 10 mLs (400 mg total) by mouth 2 (two) times daily. 480 mL 3   potassium chloride SA (KLOR-CON M) 20 MEQ tablet Take 1 tablet (20 mEq total) by mouth 2 (two) times daily. 30 tablet 3   prochlorperazine (COMPAZINE) 10 MG tablet Take 1 tablet (10 mg total) by mouth every 6 (six) hours as needed (NAUSEA). 30 tablet 1   traZODone (DESYREL) 50 MG tablet Take 1 tablet (50 mg total) by mouth at bedtime. 30 tablet 3   vitamin B-12 (CYANOCOBALAMIN) 1000 MCG tablet Take 1 tablet (1,000 mcg total) by mouth daily. 30 tablet 6   No current facility-administered medications for this visit.    ALLERGIES:  No Known Allergies  PHYSICAL EXAM:  Performance status (ECOG): 0 - Asymptomatic  There were no vitals filed  for this visit.  Wt Readings from Last 3 Encounters:  08/08/22 169 lb 1.5 oz (76.7 kg)  07/25/22 171 lb 12.8 oz (77.9 kg)  07/11/22 176 lb 12.8 oz (80.2 kg)   Physical Exam Vitals reviewed.  Constitutional:      Appearance: Normal appearance.  Cardiovascular:     Rate and Rhythm: Normal rate and regular rhythm.     Pulses: Normal pulses.     Heart sounds: Normal heart sounds.  Pulmonary:     Effort: Pulmonary effort is normal.     Breath sounds: Normal breath sounds.  Neurological:     General: No focal deficit present.     Mental Status: He is alert and oriented to person, place, and time.  Psychiatric:        Mood and Affect: Mood normal.        Behavior: Behavior normal.    LABORATORY DATA:  I have reviewed the labs as listed.     Latest Ref Rng & Units 07/25/2022    9:34 AM 07/11/2022    9:51 AM 06/27/2022    8:09 AM  CBC  WBC 4.0 - 10.5 K/uL 8.2  10.7  8.7   Hemoglobin 13.0 - 17.0 g/dL 10.6  10.6  11.1   Hematocrit 39.0 - 52.0 % 32.9  32.7  34.4   Platelets 150 - 400 K/uL 162  167  226       Latest Ref Rng & Units 07/25/2022    9:34 AM 07/11/2022    9:51 AM 06/27/2022    8:09 AM  CMP  Glucose 70 - 99 mg/dL 109  89  99   BUN 8 - 23 mg/dL '13  11  10   ' Creatinine 0.61 - 1.24 mg/dL 1.17  0.91  0.95   Sodium 135 - 145 mmol/L 140  139  138   Potassium 3.5 - 5.1 mmol/L 2.9  3.1  3.6   Chloride 98 - 111 mmol/L 109  111  105   CO2 22 - 32 mmol/L '23  23  26   ' Calcium 8.9 - 10.3 mg/dL 8.6  8.5  8.9   Total Protein 6.5 - 8.1 g/dL 7.3  7.0  7.7   Total Bilirubin 0.3 - 1.2 mg/dL 0.3  0.5  0.6   Alkaline Phos 38 - 126 U/L 81  78  63   AST 15 - 41 U/L '18  18  15   ' ALT 0 - 44 U/L '9  11  8     ' DIAGNOSTIC IMAGING:  I have independently reviewed the scans and discussed with the patient. No results found.   ASSESSMENT:  Left lung mass with multiple lung nodules: - Presentation with dry cough for 6 months. - 30 pound weight loss in the last couple of years, weight stable  over the last 6 months. - CT chest with contrast on 12/16/2021 showed bulky left hilar mass measuring 8.1 x 7.5 cm.  Numerous bilateral lung nodules of varying sizes.  Left adrenal nodule measuring 2.8 x 2.2 cm.  Mediastinal and bilateral hilar adenopathy with the largest pretracheal node measuring 4.1 x 3.1 cm. - MRI of the brain from 01/04/2022 which was negative for metastatic disease. - CTAP from 01/03/2022 which showed isolated left adrenal metastasis with no other evidence of metastatic disease in the abdomen or pelvis. - Pathology of left lung biopsy which shows adenocarcinoma with necrosis.  CK20 positive and CDX2 positive but negative for CK7 indicating colonic primary. 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  HER2 negative.  TMB low.  MSI-stable.  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  Other targetable mutations negative. - FOLFIRI started on 02/22/2022, bevacizumab added with cycle 4  - CT CAP (05/17/2022): Mediastinal and hilar lymph nodes have decreased in size.  Largest perihilar left lower lobe lung mass has decreased in size.  Right upper lobe mass slightly increased in size.  Other nodules are stable.  Left adrenal mass decreased to 1.3 cm from 2.8 cm. - CT scan showed mixed response as it was compared to CT from 12/16/2021.  He did not start chemotherapy until 02/22/2022.  Social/family history: - Lives by himself.  He paints houses for living. - He quit smoking 12 years back and started back again 1 and half year ago and smoked half pack per day.  He quit again about 1 week ago. - Father had cancer, type unknown to the patient.  Brother died of brain tumor.  3.  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  Margins negative.  No lymphovascular or perineural invasion. - Received 3 cycles of XELOX followed by Xeloda for total of 6 months.  Oxaliplatin discontinued during cycle 4 secondary to transaminitis, elevated bilirubin  and thrombocytopenia.  However he was also treated for hep C with Harvoni after that.   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  Did not start Megace.  However started using Maalox with lidocaine which is helping him eat better. - Reviewed labs today which shows normal LFTs.  CBC was grossly normal.  Last CEA was 13.9.       -We will proceed with cycle 12 of FOLFIRI and bevacizumab.  After this cycle, will give maintenance 5-FU and bevacizumab.  We also discussed option  of Xeloda and bevacizumab.  Due to his line of work and toxicity issues, it was felt that infusional 5-FU and bevacizumab was better in his situation. - RTC 6 weeks for follow-up with repeat labs.  2.  Lower rib/epigastric pain: - Pain is poorly controlled with hydrocodone. - We will start him on oxycodone 10 mg every 6 hours as needed.  3.  Hypokalemia: - Continue potassium 20 mEq daily.  Potassium is normal.  4.  Difficulty falling asleep: - Continue trazodone as needed.  5.  Hypertension: - Continue Norvasc 10 mg daily.  Blood pressure is fairly well controlled.   Orders placed this encounter:  No orders of the defined types were placed in this encounter.    Evan Jack, MD [place] [phone]   I, Thana Ates, am acting as a scribe for Dr. Derek [name].  I, Evan Jack MD, have reviewed the above documentation for accuracy and completeness, and I agree with the above.

## 2022-08-08 NOTE — Addendum Note (Signed)
Addended by: Derek Jack on: 08/08/2022 09:13 PM   Modules accepted: Orders

## 2022-08-09 ENCOUNTER — Encounter: Payer: Self-pay | Admitting: Hematology

## 2022-08-10 ENCOUNTER — Inpatient Hospital Stay: Payer: 59

## 2022-08-10 ENCOUNTER — Other Ambulatory Visit: Payer: Self-pay

## 2022-08-10 VITALS — BP 108/78 | HR 64 | Temp 97.6°F | Resp 18

## 2022-08-10 DIAGNOSIS — Z95828 Presence of other vascular implants and grafts: Secondary | ICD-10-CM

## 2022-08-10 DIAGNOSIS — Z5112 Encounter for antineoplastic immunotherapy: Secondary | ICD-10-CM | POA: Diagnosis not present

## 2022-08-10 DIAGNOSIS — C180 Malignant neoplasm of cecum: Secondary | ICD-10-CM

## 2022-08-10 MED ORDER — PEGFILGRASTIM-CBQV 6 MG/0.6ML ~~LOC~~ SOSY
6.0000 mg | PREFILLED_SYRINGE | Freq: Once | SUBCUTANEOUS | Status: AC
  Administered 2022-08-10: 6 mg via SUBCUTANEOUS
  Filled 2022-08-10: qty 0.6

## 2022-08-10 MED ORDER — SODIUM CHLORIDE 0.9% FLUSH
10.0000 mL | INTRAVENOUS | Status: DC | PRN
  Administered 2022-08-10: 10 mL

## 2022-08-10 MED ORDER — HEPARIN SOD (PORK) LOCK FLUSH 100 UNIT/ML IV SOLN
500.0000 [IU] | Freq: Once | INTRAVENOUS | Status: AC | PRN
  Administered 2022-08-10: 500 [IU]

## 2022-08-10 NOTE — Patient Instructions (Signed)
[facility]  Discharge Instructions: Thank you for choosing [facility] to provide your oncology and hematology care.  If you have a lab appointment with the Cancer Center, please come in thru the Main Entrance and check in at the main information desk.  Wear comfortable clothing and clothing appropriate for easy access to any Portacath or PICC line.   We strive to give you quality time with your provider. You may need to reschedule your appointment if you arrive late (15 or more minutes).  Arriving late affects you and other patients whose appointments are after yours.  Also, if you miss three or more appointments without notifying the office, you may be dismissed from the clinic at the provider's discretion.      For prescription refill requests, have your pharmacy contact our office and allow 72 hours for refills to be completed.     To help prevent nausea and vomiting after your treatment, we encourage you to take your nausea medication as directed.  BELOW ARE SYMPTOMS THAT SHOULD BE REPORTED IMMEDIATELY: *FEVER GREATER THAN 100.4 F (38 C) OR HIGHER *CHILLS OR SWEATING *NAUSEA AND VOMITING THAT IS NOT CONTROLLED WITH YOUR NAUSEA MEDICATION *UNUSUAL SHORTNESS OF BREATH *UNUSUAL BRUISING OR BLEEDING *URINARY PROBLEMS (pain or burning when urinating, or frequent urination) *BOWEL PROBLEMS (unusual diarrhea, constipation, pain near the anus) TENDERNESS IN MOUTH AND THROAT WITH OR WITHOUT PRESENCE OF ULCERS (sore throat, sores in mouth, or a toothache) UNUSUAL RASH, SWELLING OR PAIN  UNUSUAL VAGINAL DISCHARGE OR ITCHING   Items with * indicate a potential emergency and should be followed up as soon as possible or go to the Emergency Department if any problems should occur.  Please show the CHEMOTHERAPY ALERT CARD or IMMUNOTHERAPY ALERT CARD at check-in to the Emergency Department and triage nurse.  Should you have questions after your visit or need to  cancel or reschedule your appointment, please contact [facility] AT  [phone]  and follow the prompts.  Office hours are 8:00 a.m. to 4:30 p.m. Monday - Friday. Please note that voicemails left after 4:00 p.m. may not be returned until the following business day.  We are closed weekends and major holidays. You have access to a nurse at all times for urgent questions. Please call the main number to the clinic [phone] and follow the prompts.  For any non-urgent questions, you may also contact your provider using MyChart. We now offer e-Visits for anyone 18 and older to request care online for non-urgent symptoms. For details visit [email or web address].   Also download the MyChart app! Go to the app store, search "MyChart", open the app, select [facility], and log in with your MyChart username and password.  Masks are optional in the cancer centers. If you would like for your care team to wear a mask while they are taking care of you, please let them know. You may have one support person who is at least 65 years old accompany you for your appointments.  

## 2022-08-10 NOTE — Progress Notes (Signed)
Patients port flushed without difficulty.  Good blood return noted with no bruising or swelling noted at site.  Home infusion 5FU pump disconnected.  Band aid applied.  VSS with discharge and left in satisfactory condition with no s/s of distress noted.   °

## 2022-08-10 NOTE — Addendum Note (Signed)
Addended by: Margaret Pyle on: 08/10/2022 04:38 PM   Modules accepted: Orders

## 2022-08-16 ENCOUNTER — Encounter: Payer: Self-pay | Admitting: Hematology

## 2022-08-16 ENCOUNTER — Encounter (HOSPITAL_COMMUNITY): Payer: Self-pay | Admitting: Hematology

## 2022-08-22 ENCOUNTER — Inpatient Hospital Stay: Payer: BC Managed Care – PPO

## 2022-08-22 ENCOUNTER — Inpatient Hospital Stay: Payer: BC Managed Care – PPO | Attending: Hematology

## 2022-08-22 VITALS — BP 116/75 | HR 66 | Temp 96.7°F | Resp 18

## 2022-08-22 DIAGNOSIS — Z95828 Presence of other vascular implants and grafts: Secondary | ICD-10-CM

## 2022-08-22 DIAGNOSIS — Z5111 Encounter for antineoplastic chemotherapy: Secondary | ICD-10-CM | POA: Diagnosis present

## 2022-08-22 DIAGNOSIS — C7801 Secondary malignant neoplasm of right lung: Secondary | ICD-10-CM | POA: Diagnosis not present

## 2022-08-22 DIAGNOSIS — Z5112 Encounter for antineoplastic immunotherapy: Secondary | ICD-10-CM | POA: Insufficient documentation

## 2022-08-22 DIAGNOSIS — C180 Malignant neoplasm of cecum: Secondary | ICD-10-CM | POA: Insufficient documentation

## 2022-08-22 DIAGNOSIS — C7972 Secondary malignant neoplasm of left adrenal gland: Secondary | ICD-10-CM | POA: Diagnosis not present

## 2022-08-22 DIAGNOSIS — Z79899 Other long term (current) drug therapy: Secondary | ICD-10-CM | POA: Insufficient documentation

## 2022-08-22 DIAGNOSIS — Z87891 Personal history of nicotine dependence: Secondary | ICD-10-CM | POA: Diagnosis not present

## 2022-08-22 DIAGNOSIS — C7802 Secondary malignant neoplasm of left lung: Secondary | ICD-10-CM | POA: Diagnosis not present

## 2022-08-22 DIAGNOSIS — C189 Malignant neoplasm of colon, unspecified: Secondary | ICD-10-CM

## 2022-08-22 LAB — URINALYSIS, DIPSTICK ONLY
Bilirubin Urine: NEGATIVE
Glucose, UA: NEGATIVE mg/dL
Hgb urine dipstick: NEGATIVE
Ketones, ur: NEGATIVE mg/dL
Leukocytes,Ua: NEGATIVE
Nitrite: NEGATIVE
Protein, ur: NEGATIVE mg/dL
Specific Gravity, Urine: 1.008 (ref 1.005–1.030)
pH: 5 (ref 5.0–8.0)

## 2022-08-22 LAB — CBC WITH DIFFERENTIAL/PLATELET
Abs Immature Granulocytes: 1.4 10*3/uL — ABNORMAL HIGH (ref 0.00–0.07)
Band Neutrophils: 2 %
Basophils Absolute: 0 10*3/uL (ref 0.0–0.1)
Basophils Relative: 0 %
Eosinophils Absolute: 0 10*3/uL (ref 0.0–0.5)
Eosinophils Relative: 0 %
HCT: 30.2 % — ABNORMAL LOW (ref 39.0–52.0)
Hemoglobin: 9.7 g/dL — ABNORMAL LOW (ref 13.0–17.0)
Lymphocytes Relative: 16 %
Lymphs Abs: 1.9 10*3/uL (ref 0.7–4.0)
MCH: 31.2 pg (ref 26.0–34.0)
MCHC: 32.1 g/dL (ref 30.0–36.0)
MCV: 97.1 fL (ref 80.0–100.0)
Metamyelocytes Relative: 8 %
Monocytes Absolute: 1.8 10*3/uL — ABNORMAL HIGH (ref 0.1–1.0)
Monocytes Relative: 15 %
Myelocytes: 3 %
Neutro Abs: 6.7 10*3/uL (ref 1.7–7.7)
Neutrophils Relative %: 55 %
Platelets: 220 10*3/uL (ref 150–400)
Promyelocytes Relative: 1 %
RBC: 3.11 MIL/uL — ABNORMAL LOW (ref 4.22–5.81)
RDW: 16.2 % — ABNORMAL HIGH (ref 11.5–15.5)
WBC: 11.7 10*3/uL — ABNORMAL HIGH (ref 4.0–10.5)
nRBC: 0.3 % — ABNORMAL HIGH (ref 0.0–0.2)

## 2022-08-22 LAB — COMPREHENSIVE METABOLIC PANEL
ALT: 8 U/L (ref 0–44)
AST: 18 U/L (ref 15–41)
Albumin: 3.7 g/dL (ref 3.5–5.0)
Alkaline Phosphatase: 65 U/L (ref 38–126)
Anion gap: 8 (ref 5–15)
BUN: 9 mg/dL (ref 8–23)
CO2: 24 mmol/L (ref 22–32)
Calcium: 8.8 mg/dL — ABNORMAL LOW (ref 8.9–10.3)
Chloride: 103 mmol/L (ref 98–111)
Creatinine, Ser: 1.17 mg/dL (ref 0.61–1.24)
GFR, Estimated: >60 mL/min (ref >60)
Glucose, Bld: 98 mg/dL (ref 70–99)
Potassium: 3.2 mmol/L — ABNORMAL LOW (ref 3.5–5.1)
Sodium: 135 mmol/L (ref 135–145)
Total Bilirubin: 0.4 mg/dL (ref 0.3–1.2)
Total Protein: 7.4 g/dL (ref 6.5–8.1)

## 2022-08-22 LAB — MAGNESIUM: Magnesium: 1.8 mg/dL (ref 1.7–2.4)

## 2022-08-22 MED ORDER — FLUOROURACIL CHEMO INJECTION 2.5 GM/50ML
400.0000 mg/m2 | Freq: Once | INTRAVENOUS | Status: AC
  Administered 2022-08-22: 800 mg via INTRAVENOUS
  Filled 2022-08-22: qty 16

## 2022-08-22 MED ORDER — PALONOSETRON HCL INJECTION 0.25 MG/5ML
0.2500 mg | Freq: Once | INTRAVENOUS | Status: AC
  Administered 2022-08-22: 0.25 mg via INTRAVENOUS
  Filled 2022-08-22: qty 5

## 2022-08-22 MED ORDER — SODIUM CHLORIDE 0.9 % IV SOLN: Freq: Once | INTRAVENOUS | Status: AC

## 2022-08-22 MED ORDER — SODIUM CHLORIDE 0.9 % IV SOLN
10.0000 mg | Freq: Once | INTRAVENOUS | Status: AC
  Administered 2022-08-22: 10 mg via INTRAVENOUS
  Filled 2022-08-22: qty 1

## 2022-08-22 MED ORDER — SODIUM CHLORIDE 0.9 % IV SOLN
5000.0000 mg | INTRAVENOUS | Status: DC
  Administered 2022-08-22: 5000 mg via INTRAVENOUS
  Filled 2022-08-22: qty 100

## 2022-08-22 MED ORDER — SODIUM CHLORIDE 0.9 % IV SOLN
5.0000 mg/kg | Freq: Once | INTRAVENOUS | Status: AC
  Administered 2022-08-22: 400 mg via INTRAVENOUS
  Filled 2022-08-22: qty 16

## 2022-08-22 MED ORDER — POTASSIUM CHLORIDE CRYS ER 20 MEQ PO TBCR
40.0000 meq | EXTENDED_RELEASE_TABLET | Freq: Once | ORAL | Status: AC
  Administered 2022-08-22: 40 meq via ORAL
  Filled 2022-08-22: qty 2

## 2022-08-22 MED ORDER — SODIUM CHLORIDE 0.9 % IV SOLN
400.0000 mg/m2 | Freq: Once | INTRAVENOUS | Status: AC
  Administered 2022-08-22: 780 mg via INTRAVENOUS
  Filled 2022-08-22: qty 39

## 2022-08-22 NOTE — Patient Instructions (Signed)
[facility]  Discharge Instructions: Thank you for choosing [facility] to provide your oncology and hematology care.  If you have a lab appointment with the [facility], please come in thru the Main Entrance and check in at the main information desk.  Wear comfortable clothing and clothing appropriate for easy access to any Portacath or PICC line.   We strive to give you quality time with your provider. You may need to reschedule your appointment if you arrive late (15 or more minutes).  Arriving late affects you and other patients whose appointments are after yours.  Also, if you miss three or more appointments without notifying the office, you may be dismissed from the clinic at the provider's discretion.      For prescription refill requests, have your pharmacy contact our office and allow 72 hours for refills to be completed.    Today you received the following chemotherapy and/or immunotherapy agents Zirabev, Leucovorin, and 5FU.   To help prevent nausea and vomiting after your treatment, we encourage you to take your nausea medication as directed.   BELOW ARE SYMPTOMS THAT SHOULD BE REPORTED IMMEDIATELY: *FEVER GREATER THAN 100.4 F (38 C) OR HIGHER *CHILLS OR SWEATING *NAUSEA AND VOMITING THAT IS NOT CONTROLLED WITH YOUR NAUSEA MEDICATION *UNUSUAL SHORTNESS OF BREATH *UNUSUAL BRUISING OR BLEEDING *URINARY PROBLEMS (pain or burning when urinating, or frequent urination) *BOWEL PROBLEMS (unusual diarrhea, constipation, pain near the anus) TENDERNESS IN MOUTH AND THROAT WITH OR WITHOUT PRESENCE OF ULCERS (sore throat, sores in mouth, or a toothache) UNUSUAL RASH, SWELLING OR PAIN  UNUSUAL VAGINAL DISCHARGE OR ITCHING   Items with * indicate a potential emergency and should be followed up as soon as possible or go to the Emergency Department if any problems should occur.  Please show the CHEMOTHERAPY ALERT CARD or IMMUNOTHERAPY ALERT CARD at  check-in to the Emergency Department and triage nurse.  Should you have questions after your visit or need to cancel or reschedule your appointment, please contact [facility] [phone]  and follow the prompts.  Office hours are 8:00 a.m. to 4:30 p.m. Monday - Friday. Please note that voicemails left after 4:00 p.m. may not be returned until the following business day.  We are closed weekends and major holidays. You have access to a nurse at all times for urgent questions. Please call the main number to the clinic [phone] and follow the prompts.  For any non-urgent questions, you may also contact your provider using MyChart. We now offer e-Visits for anyone 72 and older to request care online for non-urgent symptoms. For details visit [email or web address].   Also download the MyChart app! Go to the app store, search "MyChart", open the app, select [facility], and log in with your MyChart username and password.  Masks are optional in the cancer centers. If you would like for your care team to wear a mask while they are taking care of you, please let them know. You may have one support person who is at least 65 years old accompany you for your appointments.  Bevacizumab Injection What is this medication? BEVACIZUMAB (be va SIZ yoo mab) treats some types of cancer. It works by blocking a protein that causes cancer cells to grow and multiply. This helps to slow or stop the spread of cancer cells. It is a monoclonal antibody. This medicine may be used for other purposes; ask your health care provider or pharmacist if you have questions. COMMON  BRAND NAME(S): Alymsys, Avastin, MVASI, Noah Charon What should I tell my care team before I take this medication? They need to know if you have any of these conditions: Blood clots Coughing up blood Having or recent surgery Heart failure High blood pressure History of a connection between 2 or more body parts that do not usually  connect (fistula) History of a tear in your stomach or intestines Protein in your urine An unusual or allergic reaction to bevacizumab, other medications, foods, dyes, or preservatives Pregnant or trying to get pregnant Breast-feeding How should I use this medication? This medication is injected into a vein. It is given by your care team in a hospital or clinic setting. Talk to your care team the use of this medication in children. Special care may be needed. Overdosage: If you think you have taken too much of this medicine contact a poison control center or emergency room at once. NOTE: This medicine is only for you. Do not share this medicine with others. What if I miss a dose? Keep appointments for follow-up doses. It is important not to miss your dose. Call your care team if you are unable to keep an appointment. What may interact with this medication? Interactions are not expected. This list may not describe all possible interactions. Give your health care provider a list of all the medicines, herbs, non-prescription drugs, or dietary supplements you use. Also tell them if you smoke, drink alcohol, or use illegal drugs. Some items may interact with your medicine. What should I watch for while using this medication? Your condition will be monitored carefully while you are receiving this medication. You may need blood work while taking this medication. This medication may make you feel generally unwell. This is not uncommon as chemotherapy can affect healthy cells as well as cancer cells. Report any side effects. Continue your course of treatment even though you feel ill unless your care team tells you to stop. This medication may increase your risk to bruise or bleed. Call your care team if you notice any unusual bleeding. Before having surgery, talk to your care team to make sure it is ok. This medication can increase the risk of poor healing of your surgical site or wound. You will need to  stop this medication for 28 days before surgery. After surgery, wait at least 28 days before restarting this medication. Make sure the surgical site or wound is healed enough before restarting this medication. Talk to your care team if questions. Talk to your care team if you may be pregnant. Serious birth defects can occur if you take this medication during pregnancy and for 6 months after the last dose. Contraception is recommended while taking this medication and for 6 months after the last dose. Your care team can help you find the option that works for you. Do not breastfeed while taking this medication and for 6 months after the last dose. This medication can cause infertility. Talk to your care team if you are concerned about your fertility. What side effects may I notice from receiving this medication? Side effects that you should report to your care team as soon as possible: Allergic reactions--skin rash, itching, hives, swelling of the face, lips, tongue, or throat Bleeding--bloody or black, tar-like stools, vomiting blood or brown material that looks like coffee grounds, red or dark brown urine, small red or purple spots on skin, unusual bruising or bleeding Blood clot--pain, swelling, or warmth in the leg, shortness of breath, chest pain Heart  attack--pain or tightness in the chest, shoulders, arms, or jaw, nausea, shortness of breath, cold or clammy skin, feeling faint or lightheaded Heart failure--shortness of breath, swelling of the ankles, feet, or hands, sudden weight gain, unusual weakness or fatigue Increase in blood pressure Infection--fever, chills, cough, sore throat, wounds that don't heal, pain or trouble when passing urine, general feeling of discomfort or being unwell Infusion reactions--chest pain, shortness of breath or trouble breathing, feeling faint or lightheaded Kidney injury--decrease in the amount of urine, swelling of the ankles, hands, or feet Stomach pain that is  severe, does not go away, or gets worse Stroke--sudden numbness or weakness of the face, arm, or leg, trouble speaking, confusion, trouble walking, loss of balance or coordination, dizziness, severe headache, change in vision Sudden and severe headache, confusion, change in vision, seizures, which may be signs of posterior reversible encephalopathy syndrome (PRES) Side effects that usually do not require medical attention (report to your care team if they continue or are bothersome): Back pain Change in taste Diarrhea Dry skin Increased tears Nosebleed This list may not describe all possible side effects. Call your doctor for medical advice about side effects. You may report side effects to FDA at 1-800-FDA-1088. Where should I keep my medication? This medication is given in a hospital or clinic. It will not be stored at home. NOTE: This sheet is a summary. It may not cover all possible information. If you have questions about this medicine, talk to your doctor, pharmacist, or health care provider.  2023 Elsevier/Gold Standard (2022-04-11 00:00:00)  Leucovorin Injection What is this medication? LEUCOVORIN (loo koe VOR in) prevents side effects from certain medications, such as methotrexate. It works by increasing folate levels. This helps protect healthy cells in your body. It may also be used to treat anemia caused by low levels of folate. It can also be used with fluorouracil, a type of chemotherapy, to treat colorectal cancer. It works by increasing the effects of fluorouracil in the body. This medicine may be used for other purposes; ask your health care provider or pharmacist if you have questions. What should I tell my care team before I take this medication? They need to know if you have any of these conditions: Anemia from low levels of vitamin B12 in the blood An unusual or allergic reaction to leucovorin, folic acid, other medications, foods, dyes, or preservatives Pregnant or  trying to get pregnant Breastfeeding How should I use this medication? This medication is injected into a vein or a muscle. It is given by your care team in a hospital or clinic setting. Talk to your care team about the use of this medication in children. Special care may be needed. Overdosage: If you think you have taken too much of this medicine contact a poison control center or emergency room at once. NOTE: This medicine is only for you. Do not share this medicine with others. What if I miss a dose? Keep appointments for follow-up doses. It is important not to miss your dose. Call your care team if you are unable to keep an appointment. What may interact with this medication? Capecitabine Fluorouracil Phenobarbital Phenytoin Primidone Trimethoprim;sulfamethoxazole This list may not describe all possible interactions. Give your health care provider a list of all the medicines, herbs, non-prescription drugs, or dietary supplements you use. Also tell them if you smoke, drink alcohol, or use illegal drugs. Some items may interact with your medicine. What should I watch for while using this medication? Your condition will  be monitored carefully while you are receiving this medication. This medication may increase the side effects of 5-fluorouracil. Tell your care team if you have diarrhea or mouth sores that do not get better or that get worse. What side effects may I notice from receiving this medication? Side effects that you should report to your care team as soon as possible: Allergic reactions--skin rash, itching, hives, swelling of the face, lips, tongue, or throat This list may not describe all possible side effects. Call your doctor for medical advice about side effects. You may report side effects to FDA at 1-800-FDA-1088. Where should I keep my medication? This medication is given in a hospital or clinic. It will not be stored at home. NOTE: This sheet is a summary. It may not  cover all possible information. If you have questions about this medicine, talk to your doctor, pharmacist, or health care provider.  2023 Elsevier/Gold Standard (2022-04-07 00:00:00)  Fluorouracil Injection What is this medication? FLUOROURACIL (flure oh YOOR a sil) treats some types of cancer. It works by slowing down the growth of cancer cells. This medicine may be used for other purposes; ask your health care provider or pharmacist if you have questions. COMMON BRAND NAME(S): Adrucil What should I tell my care team before I take this medication? They need to know if you have any of these conditions: Blood disorders Dihydropyrimidine dehydrogenase (DPD) deficiency Infection, such as chickenpox, cold sores, herpes Kidney disease Liver disease Poor nutrition Recent or ongoing radiation therapy An unusual or allergic reaction to fluorouracil, other medications, foods, dyes, or preservatives If you or your partner are pregnant or trying to get pregnant Breast-feeding How should I use this medication? This medication is injected into a vein. It is administered by your care team in a hospital or clinic setting. Talk to your care team about the use of this medication in children. Special care may be needed. Overdosage: If you think you have taken too much of this medicine contact a poison control center or emergency room at once. NOTE: This medicine is only for you. Do not share this medicine with others. What if I miss a dose? Keep appointments for follow-up doses. It is important not to miss your dose. Call your care team if you are unable to keep an appointment. What may interact with this medication? Do not take this medication with any of the following: Live virus vaccines This medication may also interact with the following: Medications that treat or prevent blood clots, such as warfarin, enoxaparin, dalteparin This list may not describe all possible interactions. Give your health  care provider a list of all the medicines, herbs, non-prescription drugs, or dietary supplements you use. Also tell them if you smoke, drink alcohol, or use illegal drugs. Some items may interact with your medicine. What should I watch for while using this medication? Your condition will be monitored carefully while you are receiving this medication. This medication may make you feel generally unwell. This is not uncommon as chemotherapy can affect healthy cells as well as cancer cells. Report any side effects. Continue your course of treatment even though you feel ill unless your care team tells you to stop. In some cases, you may be given additional medications to help with side effects. Follow all directions for their use. This medication may increase your risk of getting an infection. Call your care team for advice if you get a fever, chills, sore throat, or other symptoms of a cold or flu. Do  not treat yourself. Try to avoid being around people who are sick. This medication may increase your risk to bruise or bleed. Call your care team if you notice any unusual bleeding. Be careful brushing or flossing your teeth or using a toothpick because you may get an infection or bleed more easily. If you have any dental work done, tell your dentist you are receiving this medication. Avoid taking medications that contain aspirin, acetaminophen, ibuprofen, naproxen, or ketoprofen unless instructed by your care team. These medications may hide a fever. Do not treat diarrhea with over the counter products. Contact your care team if you have diarrhea that lasts more than 2 days or if it is severe and watery. This medication can make you more sensitive to the sun. Keep out of the sun. If you cannot avoid being in the sun, wear protective clothing and sunscreen. Do not use sun lamps, tanning beds, or tanning booths. Talk to your care team if you or your partner wish to become pregnant or think you might be pregnant.  This medication can cause serious birth defects if taken during pregnancy and for 3 months after the last dose. A reliable form of contraception is recommended while taking this medication and for 3 months after the last dose. Talk to your care team about effective forms of contraception. Do not father a child while taking this medication and for 3 months after the last dose. Use a condom while having sex during this time period. Do not breastfeed while taking this medication. This medication may cause infertility. Talk to your care team if you are concerned about your fertility. What side effects may I notice from receiving this medication? Side effects that you should report to your care team as soon as possible: Allergic reactions--skin rash, itching, hives, swelling of the face, lips, tongue, or throat Heart attack--pain or tightness in the chest, shoulders, arms, or jaw, nausea, shortness of breath, cold or clammy skin, feeling faint or lightheaded Heart failure--shortness of breath, swelling of the ankles, feet, or hands, sudden weight gain, unusual weakness or fatigue Heart rhythm changes--fast or irregular heartbeat, dizziness, feeling faint or lightheaded, chest pain, trouble breathing High ammonia level--unusual weakness or fatigue, confusion, loss of appetite, nausea, vomiting, seizures Infection--fever, chills, cough, sore throat, wounds that don't heal, pain or trouble when passing urine, general feeling of discomfort or being unwell Low red blood cell level--unusual weakness or fatigue, dizziness, headache, trouble breathing Pain, tingling, or numbness in the hands or feet, muscle weakness, change in vision, confusion or trouble speaking, loss of balance or coordination, trouble walking, seizures Redness, swelling, and blistering of the skin over hands and feet Severe or prolonged diarrhea Unusual bruising or bleeding Side effects that usually do not require medical attention (report to  your care team if they continue or are bothersome): Dry skin Headache Increased tears Nausea Pain, redness, or swelling with sores inside the mouth or throat Sensitivity to light Vomiting This list may not describe all possible side effects. Call your doctor for medical advice about side effects. You may report side effects to FDA at 1-800-FDA-1088. Where should I keep my medication? This medication is given in a hospital or clinic. It will not be stored at home. NOTE: This sheet is a summary. It may not cover all possible information. If you have questions about this medicine, talk to your doctor, pharmacist, or health care provider.  2023 Elsevier/Gold Standard (2022-04-04 00:00:00)

## 2022-08-22 NOTE — Progress Notes (Signed)
Pt presents today for treatment per provider's order. Vital signs and labs WNL for treatment today. Pt's potassium was 3.2 today per Dr.K's standing order, pt will receive potassium chloride 40 mEq p.o x 1 dose. Okay to proceed with treatment today.   Zirabev, Leucovorin, and 5FU given today per MD orders. Tolerated infusion without adverse affects. Vital signs stable. No complaints at this time. Discharged from clinic ambulatory in stable condition. Alert and oriented x 3. F/U with [facility] as scheduled. 5FU ambulatory pump infusing.

## 2022-08-22 NOTE — Progress Notes (Signed)
Patients port flushed without difficulty.  Good blood return noted with no bruising or swelling noted at site.  Stable during access and blood draw.  Patient to remain accessed for treatment. 

## 2022-08-23 ENCOUNTER — Ambulatory Visit (HOSPITAL_COMMUNITY): Payer: 59

## 2022-08-24 ENCOUNTER — Inpatient Hospital Stay: Payer: BC Managed Care – PPO

## 2022-08-24 VITALS — BP 129/88 | HR 74 | Temp 97.3°F | Resp 18

## 2022-08-24 DIAGNOSIS — C180 Malignant neoplasm of cecum: Secondary | ICD-10-CM | POA: Diagnosis not present

## 2022-08-24 DIAGNOSIS — Z95828 Presence of other vascular implants and grafts: Secondary | ICD-10-CM

## 2022-08-24 MED ORDER — SODIUM CHLORIDE 0.9% FLUSH
10.0000 mL | INTRAVENOUS | Status: DC | PRN
  Administered 2022-08-24: 10 mL

## 2022-08-24 MED ORDER — PEGFILGRASTIM-CBQV 6 MG/0.6ML ~~LOC~~ SOSY: 6.0000 mg | PREFILLED_SYRINGE | Freq: Once | SUBCUTANEOUS | Status: DC

## 2022-08-24 MED ORDER — HEPARIN SOD (PORK) LOCK FLUSH 100 UNIT/ML IV SOLN
500.0000 [IU] | Freq: Once | INTRAVENOUS | Status: AC | PRN
  Administered 2022-08-24: 500 [IU]

## 2022-08-24 NOTE — Progress Notes (Signed)
Hold Udenyca today, irinotecan was held on 08/22/22.  ANC 6.7  Anastasio Champion, RN/Dameion Briles Ronnald Ramp PharmD

## 2022-08-24 NOTE — Progress Notes (Signed)
Evan [name] presents to have home infusion pump d/c'd and for port-a-cath deaccess with flush.  Portacath located right chest wall accessed with  H 20 needle.  Good blood return present. Portacath flushed with NS and 500U/63ml Heparin, and needle removed intact.  Procedure tolerated well and without incident. Discharged ambulatory in stable condition.

## 2022-08-24 NOTE — Patient Instructions (Signed)
[facility]  Discharge Instructions: Thank you for choosing [facility] to provide your oncology and hematology care.  If you have a lab appointment with the [facility], please come in thru the Main Entrance and check in at the main information desk.  Wear comfortable clothing and clothing appropriate for easy access to any Portacath or PICC line.   We strive to give you quality time with your provider. You may need to reschedule your appointment if you arrive late (15 or more minutes).  Arriving late affects you and other patients whose appointments are after yours.  Also, if you miss three or more appointments without notifying the office, you may be dismissed from the clinic at the provider's discretion.      For prescription refill requests, have your pharmacy contact our office and allow 72 hours for refills to be completed.    Today you received the following:  ambulatory pump disconnection   To help prevent nausea and vomiting after your treatment, we encourage you to take your nausea medication as directed.  BELOW ARE SYMPTOMS THAT SHOULD BE REPORTED IMMEDIATELY: *FEVER GREATER THAN 100.4 F (38 C) OR HIGHER *CHILLS OR SWEATING *NAUSEA AND VOMITING THAT IS NOT CONTROLLED WITH YOUR NAUSEA MEDICATION *UNUSUAL SHORTNESS OF BREATH *UNUSUAL BRUISING OR BLEEDING *URINARY PROBLEMS (pain or burning when urinating, or frequent urination) *BOWEL PROBLEMS (unusual diarrhea, constipation, pain near the anus) TENDERNESS IN MOUTH AND THROAT WITH OR WITHOUT PRESENCE OF ULCERS (sore throat, sores in mouth, or a toothache) UNUSUAL RASH, SWELLING OR PAIN  UNUSUAL VAGINAL DISCHARGE OR ITCHING   Items with * indicate a potential emergency and should be followed up as soon as possible or go to the Emergency Department if any problems should occur.  Please show the CHEMOTHERAPY ALERT CARD or IMMUNOTHERAPY ALERT CARD at check-in to the Emergency Department and  triage nurse.  Should you have questions after your visit or need to cancel or reschedule your appointment, please contact [facility] [phone]  and follow the prompts.  Office hours are 8:00 a.m. to 4:30 p.m. Monday - Friday. Please note that voicemails left after 4:00 p.m. may not be returned until the following business day.  We are closed weekends and major holidays. You have access to a nurse at all times for urgent questions. Please call the main number to the clinic [phone] and follow the prompts.  For any non-urgent questions, you may also contact your provider using MyChart. We now offer e-Visits for anyone 15 and older to request care online for non-urgent symptoms. For details visit [email or web address].   Also download the MyChart app! Go to the app store, search "MyChart", open the app, select [facility], and log in with your MyChart username and password.  Masks are optional in the cancer centers. If you would like for your care team to wear a mask while they are taking care of you, please let them know. You may have one support person who is at least 65 years old accompany you for your appointments.

## 2022-08-30 ENCOUNTER — Other Ambulatory Visit (HOSPITAL_COMMUNITY): Payer: 59

## 2022-09-05 ENCOUNTER — Inpatient Hospital Stay: Payer: BC Managed Care – PPO

## 2022-09-05 ENCOUNTER — Inpatient Hospital Stay: Payer: BC Managed Care – PPO | Admitting: Hematology

## 2022-09-05 VITALS — BP 135/77 | HR 65 | Temp 96.9°F | Resp 18

## 2022-09-05 DIAGNOSIS — K5900 Constipation, unspecified: Secondary | ICD-10-CM

## 2022-09-05 DIAGNOSIS — C180 Malignant neoplasm of cecum: Secondary | ICD-10-CM

## 2022-09-05 DIAGNOSIS — C189 Malignant neoplasm of colon, unspecified: Secondary | ICD-10-CM

## 2022-09-05 DIAGNOSIS — Z95828 Presence of other vascular implants and grafts: Secondary | ICD-10-CM

## 2022-09-05 LAB — CBC WITH DIFFERENTIAL/PLATELET
Abs Immature Granulocytes: 0.02 10*3/uL (ref 0.00–0.07)
Basophils Absolute: 0.1 10*3/uL (ref 0.0–0.1)
Basophils Relative: 3 %
Eosinophils Absolute: 0.2 10*3/uL (ref 0.0–0.5)
Eosinophils Relative: 4 %
HCT: 29.2 % — ABNORMAL LOW (ref 39.0–52.0)
Hemoglobin: 9.4 g/dL — ABNORMAL LOW (ref 13.0–17.0)
Immature Granulocytes: 1 %
Lymphocytes Relative: 45 %
Lymphs Abs: 2 10*3/uL (ref 0.7–4.0)
MCH: 31.9 pg (ref 26.0–34.0)
MCHC: 32.2 g/dL (ref 30.0–36.0)
MCV: 99 fL (ref 80.0–100.0)
Monocytes Absolute: 0.8 10*3/uL (ref 0.1–1.0)
Monocytes Relative: 19 %
Neutro Abs: 1.2 10*3/uL — ABNORMAL LOW (ref 1.7–7.7)
Neutrophils Relative %: 28 %
Platelets: 138 10*3/uL — ABNORMAL LOW (ref 150–400)
RBC: 2.95 MIL/uL — ABNORMAL LOW (ref 4.22–5.81)
RDW: 17.1 % — ABNORMAL HIGH (ref 11.5–15.5)
WBC: 4.2 10*3/uL (ref 4.0–10.5)
nRBC: 0 % (ref 0.0–0.2)

## 2022-09-05 LAB — COMPREHENSIVE METABOLIC PANEL
ALT: 10 U/L (ref 0–44)
AST: 18 U/L (ref 15–41)
Albumin: 3.6 g/dL (ref 3.5–5.0)
Alkaline Phosphatase: 46 U/L (ref 38–126)
Anion gap: 7 (ref 5–15)
BUN: 10 mg/dL (ref 8–23)
CO2: 26 mmol/L (ref 22–32)
Calcium: 8.6 mg/dL — ABNORMAL LOW (ref 8.9–10.3)
Chloride: 106 mmol/L (ref 98–111)
Creatinine, Ser: 1 mg/dL (ref 0.61–1.24)
GFR, Estimated: >60 mL/min (ref >60)
Glucose, Bld: 76 mg/dL (ref 70–99)
Potassium: 3.6 mmol/L (ref 3.5–5.1)
Sodium: 139 mmol/L (ref 135–145)
Total Bilirubin: 0.6 mg/dL (ref 0.3–1.2)
Total Protein: 6.9 g/dL (ref 6.5–8.1)

## 2022-09-05 LAB — MAGNESIUM: Magnesium: 1.9 mg/dL (ref 1.7–2.4)

## 2022-09-05 MED ORDER — SODIUM CHLORIDE 0.9 % IV SOLN
10.0000 mg | Freq: Once | INTRAVENOUS | Status: AC
  Administered 2022-09-05: 10 mg via INTRAVENOUS
  Filled 2022-09-05: qty 10

## 2022-09-05 MED ORDER — SODIUM CHLORIDE 0.9 % IV SOLN
5000.0000 mg | INTRAVENOUS | Status: DC
  Administered 2022-09-05: 5000 mg via INTRAVENOUS
  Filled 2022-09-05: qty 100

## 2022-09-05 MED ORDER — SODIUM CHLORIDE 0.9 % IV SOLN: Freq: Once | INTRAVENOUS | Status: AC

## 2022-09-05 MED ORDER — LACTULOSE 20 GM/30ML PO SOLN: 10.0000 g | Freq: Every day | ORAL | 1 refills | Status: DC

## 2022-09-05 MED ORDER — SODIUM CHLORIDE 0.9 % IV SOLN
5.0000 mg/kg | Freq: Once | INTRAVENOUS | Status: AC
  Administered 2022-09-05: 400 mg via INTRAVENOUS
  Filled 2022-09-05: qty 16

## 2022-09-05 MED ORDER — SODIUM CHLORIDE 0.9% FLUSH: 10.0000 mL | INTRAVENOUS | Status: DC | PRN

## 2022-09-05 MED ORDER — FLUOROURACIL CHEMO INJECTION 2.5 GM/50ML
400.0000 mg/m2 | Freq: Once | INTRAVENOUS | Status: AC
  Administered 2022-09-05: 800 mg via INTRAVENOUS
  Filled 2022-09-05: qty 16

## 2022-09-05 MED ORDER — PALONOSETRON HCL INJECTION 0.25 MG/5ML
0.2500 mg | Freq: Once | INTRAVENOUS | Status: AC
  Administered 2022-09-05: 0.25 mg via INTRAVENOUS
  Filled 2022-09-05: qty 5

## 2022-09-05 MED ORDER — SODIUM CHLORIDE 0.9 % IV SOLN
400.0000 mg/m2 | Freq: Once | INTRAVENOUS | Status: AC
  Administered 2022-09-05: 780 mg via INTRAVENOUS
  Filled 2022-09-05: qty 39

## 2022-09-05 MED ORDER — SODIUM CHLORIDE 0.9 % IV SOLN
400.0000 mg/m2 | Freq: Once | INTRAVENOUS | Status: DC
  Filled 2022-09-05: qty 39

## 2022-09-05 NOTE — Progress Notes (Signed)
Anc 1.2 today with verbal order ok to treat Dr. Delton Coombes.   Patient tolerated chemotherapy with no complaints voiced.  Side effects with management reviewed with understanding verbalized.  Port site clean and dry with no bruising or swelling noted at site.  Good blood return noted before and after administration of chemotherapy.  Chemotherapy pump connected with no alarms noted.  Dressing intact.  Patient left in satisfactory condition with VSS and no s/s of distress noted.

## 2022-09-05 NOTE — Patient Instructions (Signed)
[facility]  Discharge Instructions: Thank you for choosing [facility] to provide your oncology and hematology care.  If you have a lab appointment with the [facility], please come in thru the Main Entrance and check in at the main information desk.  Wear comfortable clothing and clothing appropriate for easy access to any Portacath or PICC line.   We strive to give you quality time with your provider. You may need to reschedule your appointment if you arrive late (15 or more minutes).  Arriving late affects you and other patients whose appointments are after yours.  Also, if you miss three or more appointments without notifying the office, you may be dismissed from the clinic at the provider's discretion.      For prescription refill requests, have your pharmacy contact our office and allow 72 hours for refills to be completed.    Today you received the following chemotherapy and/or immunotherapy agents avastin, leucovorin, and adrucil.   Leucovorin Injection What is this medication? LEUCOVORIN (loo koe VOR in) prevents side effects from certain medications, such as methotrexate. It works by increasing folate levels. This helps protect healthy cells in your body. It may also be used to treat anemia caused by low levels of folate. It can also be used with fluorouracil, a type of chemotherapy, to treat colorectal cancer. It works by increasing the effects of fluorouracil in the body. This medicine may be used for other purposes; ask your health care provider or pharmacist if you have questions. What should I tell my care team before I take this medication? They need to know if you have any of these conditions: Anemia from low levels of vitamin B12 in the blood An unusual or allergic reaction to leucovorin, folic acid, other medications, foods, dyes, or preservatives Pregnant or trying to get pregnant Breastfeeding How should I use this medication? This  medication is injected into a vein or a muscle. It is given by your care team in a hospital or clinic setting. Talk to your care team about the use of this medication in children. Special care may be needed. Overdosage: If you think you have taken too much of this medicine contact a poison control center or emergency room at once. NOTE: This medicine is only for you. Do not share this medicine with others. What if I miss a dose? Keep appointments for follow-up doses. It is important not to miss your dose. Call your care team if you are unable to keep an appointment. What may interact with this medication? Capecitabine Fluorouracil Phenobarbital Phenytoin Primidone Trimethoprim;sulfamethoxazole This list may not describe all possible interactions. Give your health care provider a list of all the medicines, herbs, non-prescription drugs, or dietary supplements you use. Also tell them if you smoke, drink alcohol, or use illegal drugs. Some items may interact with your medicine. What should I watch for while using this medication? Your condition will be monitored carefully while you are receiving this medication. This medication may increase the side effects of 5-fluorouracil. Tell your care team if you have diarrhea or mouth sores that do not get better or that get worse. What side effects may I notice from receiving this medication? Side effects that you should report to your care team as soon as possible: Allergic reactions--skin rash, itching, hives, swelling of the face, lips, tongue, or throat This list may not describe all possible side effects. Call your doctor for medical advice about side effects. You may report  side effects to FDA at 1-800-FDA-1088. Where should I keep my medication? This medication is given in a hospital or clinic. It will not be stored at home. NOTE: This sheet is a summary. It may not cover all possible information. If you have questions about this medicine, talk to  your doctor, pharmacist, or health care provider.  2023 Elsevier/Gold Standard (2022-04-07 00:00:00) Fluorouracil Injection What is this medication? FLUOROURACIL (flure oh YOOR a sil) treats some types of cancer. It works by slowing down the growth of cancer cells. This medicine may be used for other purposes; ask your health care provider or pharmacist if you have questions. COMMON BRAND NAME(S): Adrucil What should I tell my care team before I take this medication? They need to know if you have any of these conditions: Blood disorders Dihydropyrimidine dehydrogenase (DPD) deficiency Infection, such as chickenpox, cold sores, herpes Kidney disease Liver disease Poor nutrition Recent or ongoing radiation therapy An unusual or allergic reaction to fluorouracil, other medications, foods, dyes, or preservatives If you or your partner are pregnant or trying to get pregnant Breast-feeding How should I use this medication? This medication is injected into a vein. It is administered by your care team in a hospital or clinic setting. Talk to your care team about the use of this medication in children. Special care may be needed. Overdosage: If you think you have taken too much of this medicine contact a poison control center or emergency room at once. NOTE: This medicine is only for you. Do not share this medicine with others. What if I miss a dose? Keep appointments for follow-up doses. It is important not to miss your dose. Call your care team if you are unable to keep an appointment. What may interact with this medication? Do not take this medication with any of the following: Live virus vaccines This medication may also interact with the following: Medications that treat or prevent blood clots, such as warfarin, enoxaparin, dalteparin This list may not describe all possible interactions. Give your health care provider a list of all the medicines, herbs, non-prescription drugs, or dietary  supplements you use. Also tell them if you smoke, drink alcohol, or use illegal drugs. Some items may interact with your medicine. What should I watch for while using this medication? Your condition will be monitored carefully while you are receiving this medication. This medication may make you feel generally unwell. This is not uncommon as chemotherapy can affect healthy cells as well as cancer cells. Report any side effects. Continue your course of treatment even though you feel ill unless your care team tells you to stop. In some cases, you may be given additional medications to help with side effects. Follow all directions for their use. This medication may increase your risk of getting an infection. Call your care team for advice if you get a fever, chills, sore throat, or other symptoms of a cold or flu. Do not treat yourself. Try to avoid being around people who are sick. This medication may increase your risk to bruise or bleed. Call your care team if you notice any unusual bleeding. Be careful brushing or flossing your teeth or using a toothpick because you may get an infection or bleed more easily. If you have any dental work done, tell your dentist you are receiving this medication. Avoid taking medications that contain aspirin, acetaminophen, ibuprofen, naproxen, or ketoprofen unless instructed by your care team. These medications may hide a fever. Do not treat diarrhea with over  the counter products. Contact your care team if you have diarrhea that lasts more than 2 days or if it is severe and watery. This medication can make you more sensitive to the sun. Keep out of the sun. If you cannot avoid being in the sun, wear protective clothing and sunscreen. Do not use sun lamps, tanning beds, or tanning booths. Talk to your care team if you or your partner wish to become pregnant or think you might be pregnant. This medication can cause serious birth defects if taken during pregnancy and for 3  months after the last dose. A reliable form of contraception is recommended while taking this medication and for 3 months after the last dose. Talk to your care team about effective forms of contraception. Do not father a child while taking this medication and for 3 months after the last dose. Use a condom while having sex during this time period. Do not breastfeed while taking this medication. This medication may cause infertility. Talk to your care team if you are concerned about your fertility. What side effects may I notice from receiving this medication? Side effects that you should report to your care team as soon as possible: Allergic reactions--skin rash, itching, hives, swelling of the face, lips, tongue, or throat Heart attack--pain or tightness in the chest, shoulders, arms, or jaw, nausea, shortness of breath, cold or clammy skin, feeling faint or lightheaded Heart failure--shortness of breath, swelling of the ankles, feet, or hands, sudden weight gain, unusual weakness or fatigue Heart rhythm changes--fast or irregular heartbeat, dizziness, feeling faint or lightheaded, chest pain, trouble breathing High ammonia level--unusual weakness or fatigue, confusion, loss of appetite, nausea, vomiting, seizures Infection--fever, chills, cough, sore throat, wounds that don't heal, pain or trouble when passing urine, general feeling of discomfort or being unwell Low red blood cell level--unusual weakness or fatigue, dizziness, headache, trouble breathing Pain, tingling, or numbness in the hands or feet, muscle weakness, change in vision, confusion or trouble speaking, loss of balance or coordination, trouble walking, seizures Redness, swelling, and blistering of the skin over hands and feet Severe or prolonged diarrhea Unusual bruising or bleeding Side effects that usually do not require medical attention (report to your care team if they continue or are bothersome): Dry skin Headache Increased  tears Nausea Pain, redness, or swelling with sores inside the mouth or throat Sensitivity to light Vomiting This list may not describe all possible side effects. Call your doctor for medical advice about side effects. You may report side effects to FDA at 1-800-FDA-1088. Where should I keep my medication? This medication is given in a hospital or clinic. It will not be stored at home. NOTE: This sheet is a summary. It may not cover all possible information. If you have questions about this medicine, talk to your doctor, pharmacist, or health care provider.  2023 Elsevier/Gold Standard (2022-04-04 00:00:00) Bevacizumab Injection What is this medication? BEVACIZUMAB (be va SIZ yoo mab) treats some types of cancer. It works by blocking a protein that causes cancer cells to grow and multiply. This helps to slow or stop the spread of cancer cells. It is a monoclonal antibody. This medicine may be used for other purposes; ask your health care provider or pharmacist if you have questions. COMMON BRAND NAME(S): Alymsys, Avastin, MVASI, Noah Charon What should I tell my care team before I take this medication? They need to know if you have any of these conditions: Blood clots Coughing up blood Having or recent surgery Heart failure  High blood pressure History of a connection between 2 or more body parts that do not usually connect (fistula) History of a tear in your stomach or intestines Protein in your urine An unusual or allergic reaction to bevacizumab, other medications, foods, dyes, or preservatives Pregnant or trying to get pregnant Breast-feeding How should I use this medication? This medication is injected into a vein. It is given by your care team in a hospital or clinic setting. Talk to your care team the use of this medication in children. Special care may be needed. Overdosage: If you think you have taken too much of this medicine contact a poison control center or emergency room at  once. NOTE: This medicine is only for you. Do not share this medicine with others. What if I miss a dose? Keep appointments for follow-up doses. It is important not to miss your dose. Call your care team if you are unable to keep an appointment. What may interact with this medication? Interactions are not expected. This list may not describe all possible interactions. Give your health care provider a list of all the medicines, herbs, non-prescription drugs, or dietary supplements you use. Also tell them if you smoke, drink alcohol, or use illegal drugs. Some items may interact with your medicine. What should I watch for while using this medication? Your condition will be monitored carefully while you are receiving this medication. You may need blood work while taking this medication. This medication may make you feel generally unwell. This is not uncommon as chemotherapy can affect healthy cells as well as cancer cells. Report any side effects. Continue your course of treatment even though you feel ill unless your care team tells you to stop. This medication may increase your risk to bruise or bleed. Call your care team if you notice any unusual bleeding. Before having surgery, talk to your care team to make sure it is ok. This medication can increase the risk of poor healing of your surgical site or wound. You will need to stop this medication for 28 days before surgery. After surgery, wait at least 28 days before restarting this medication. Make sure the surgical site or wound is healed enough before restarting this medication. Talk to your care team if questions. Talk to your care team if you may be pregnant. Serious birth defects can occur if you take this medication during pregnancy and for 6 months after the last dose. Contraception is recommended while taking this medication and for 6 months after the last dose. Your care team can help you find the option that works for you. Do not breastfeed  while taking this medication and for 6 months after the last dose. This medication can cause infertility. Talk to your care team if you are concerned about your fertility. What side effects may I notice from receiving this medication? Side effects that you should report to your care team as soon as possible: Allergic reactions--skin rash, itching, hives, swelling of the face, lips, tongue, or throat Bleeding--bloody or black, tar-like stools, vomiting blood or brown material that looks like coffee grounds, red or dark brown urine, small red or purple spots on skin, unusual bruising or bleeding Blood clot--pain, swelling, or warmth in the leg, shortness of breath, chest pain Heart attack--pain or tightness in the chest, shoulders, arms, or jaw, nausea, shortness of breath, cold or clammy skin, feeling faint or lightheaded Heart failure--shortness of breath, swelling of the ankles, feet, or hands, sudden weight gain, unusual weakness or fatigue  Increase in blood pressure Infection--fever, chills, cough, sore throat, wounds that don't heal, pain or trouble when passing urine, general feeling of discomfort or being unwell Infusion reactions--chest pain, shortness of breath or trouble breathing, feeling faint or lightheaded Kidney injury--decrease in the amount of urine, swelling of the ankles, hands, or feet Stomach pain that is severe, does not go away, or gets worse Stroke--sudden numbness or weakness of the face, arm, or leg, trouble speaking, confusion, trouble walking, loss of balance or coordination, dizziness, severe headache, change in vision Sudden and severe headache, confusion, change in vision, seizures, which may be signs of posterior reversible encephalopathy syndrome (PRES) Side effects that usually do not require medical attention (report to your care team if they continue or are bothersome): Back pain Change in taste Diarrhea Dry skin Increased tears Nosebleed This list may not  describe all possible side effects. Call your doctor for medical advice about side effects. You may report side effects to FDA at 1-800-FDA-1088. Where should I keep my medication? This medication is given in a hospital or clinic. It will not be stored at home. NOTE: This sheet is a summary. It may not cover all possible information. If you have questions about this medicine, talk to your doctor, pharmacist, or health care provider.  2023 Elsevier/Gold Standard (2022-04-11 00:00:00)       To help prevent nausea and vomiting after your treatment, we encourage you to take your nausea medication as directed.  BELOW ARE SYMPTOMS THAT SHOULD BE REPORTED IMMEDIATELY: *FEVER GREATER THAN 100.4 F (38 C) OR HIGHER *CHILLS OR SWEATING *NAUSEA AND VOMITING THAT IS NOT CONTROLLED WITH YOUR NAUSEA MEDICATION *UNUSUAL SHORTNESS OF BREATH *UNUSUAL BRUISING OR BLEEDING *URINARY PROBLEMS (pain or burning when urinating, or frequent urination) *BOWEL PROBLEMS (unusual diarrhea, constipation, pain near the anus) TENDERNESS IN MOUTH AND THROAT WITH OR WITHOUT PRESENCE OF ULCERS (sore throat, sores in mouth, or a toothache) UNUSUAL RASH, SWELLING OR PAIN  UNUSUAL VAGINAL DISCHARGE OR ITCHING   Items with * indicate a potential emergency and should be followed up as soon as possible or go to the Emergency Department if any problems should occur.  Please show the CHEMOTHERAPY ALERT CARD or IMMUNOTHERAPY ALERT CARD at check-in to the Emergency Department and triage nurse.  Should you have questions after your visit or need to cancel or reschedule your appointment, please contact [facility] [phone]  and follow the prompts.  Office hours are 8:00 a.m. to 4:30 p.m. Monday - Friday. Please note that voicemails left after 4:00 p.m. may not be returned until the following business day.  We are closed weekends and major holidays. You have access to a nurse at all times for urgent questions.  Please call the main number to the clinic [phone] and follow the prompts.  For any non-urgent questions, you may also contact your provider using MyChart. We now offer e-Visits for anyone 64 and older to request care online for non-urgent symptoms. For details visit [email or web address].   Also download the MyChart app! Go to the app store, search "MyChart", open the app, select [facility], and log in with your MyChart username and password.  Masks are optional in the cancer centers. If you would like for your care team to wear a mask while they are taking care of you, please let them know. You may have one support person who is at least 65 years old accompany you for your appointments.

## 2022-09-06 ENCOUNTER — Other Ambulatory Visit: Payer: Self-pay

## 2022-09-07 ENCOUNTER — Inpatient Hospital Stay: Payer: BC Managed Care – PPO

## 2022-09-07 VITALS — BP 160/94 | HR 76 | Temp 97.0°F | Resp 18

## 2022-09-07 DIAGNOSIS — C180 Malignant neoplasm of cecum: Secondary | ICD-10-CM | POA: Diagnosis not present

## 2022-09-07 DIAGNOSIS — Z95828 Presence of other vascular implants and grafts: Secondary | ICD-10-CM

## 2022-09-07 MED ORDER — SODIUM CHLORIDE 0.9% FLUSH
10.0000 mL | INTRAVENOUS | Status: DC | PRN
  Administered 2022-09-07: 10 mL

## 2022-09-07 MED ORDER — HEPARIN SOD (PORK) LOCK FLUSH 100 UNIT/ML IV SOLN
500.0000 [IU] | Freq: Once | INTRAVENOUS | Status: AC | PRN
  Administered 2022-09-07: 500 [IU]

## 2022-09-07 NOTE — Patient Instructions (Signed)
[facility]  Discharge Instructions: Thank you for choosing [facility] to provide your oncology and hematology care.  If you have a lab appointment with the [facility], please come in thru the Main Entrance and check in at the main information desk.  Wear comfortable clothing and clothing appropriate for easy access to any Portacath or PICC line.   We strive to give you quality time with your provider. You may need to reschedule your appointment if you arrive late (15 or more minutes).  Arriving late affects you and other patients whose appointments are after yours.  Also, if you miss three or more appointments without notifying the office, you may be dismissed from the clinic at the provider's discretion.      For prescription refill requests, have your pharmacy contact our office and allow 72 hours for refills to be completed.    Today you received the following chemotherapy and/or immunotherapy agents 5FU pump.       To help prevent nausea and vomiting after your treatment, we encourage you to take your nausea medication as directed.  BELOW ARE SYMPTOMS THAT SHOULD BE REPORTED IMMEDIATELY: *FEVER GREATER THAN 100.4 F (38 C) OR HIGHER *CHILLS OR SWEATING *NAUSEA AND VOMITING THAT IS NOT CONTROLLED WITH YOUR NAUSEA MEDICATION *UNUSUAL SHORTNESS OF BREATH *UNUSUAL BRUISING OR BLEEDING *URINARY PROBLEMS (pain or burning when urinating, or frequent urination) *BOWEL PROBLEMS (unusual diarrhea, constipation, pain near the anus) TENDERNESS IN MOUTH AND THROAT WITH OR WITHOUT PRESENCE OF ULCERS (sore throat, sores in mouth, or a toothache) UNUSUAL RASH, SWELLING OR PAIN  UNUSUAL VAGINAL DISCHARGE OR ITCHING   Items with * indicate a potential emergency and should be followed up as soon as possible or go to the Emergency Department if any problems should occur.  Please show the CHEMOTHERAPY ALERT CARD or IMMUNOTHERAPY ALERT CARD at check-in to the  Emergency Department and triage nurse.  Should you have questions after your visit or need to cancel or reschedule your appointment, please contact [facility] [phone]  and follow the prompts.  Office hours are 8:00 a.m. to 4:30 p.m. Monday - Friday. Please note that voicemails left after 4:00 p.m. may not be returned until the following business day.  We are closed weekends and major holidays. You have access to a nurse at all times for urgent questions. Please call the main number to the clinic [phone] and follow the prompts.  For any non-urgent questions, you may also contact your provider using MyChart. We now offer e-Visits for anyone 57 and older to request care online for non-urgent symptoms. For details visit [email or web address].   Also download the MyChart app! Go to the app store, search "MyChart", open the app, select [facility], and log in with your MyChart username and password.  Masks are optional in the cancer centers. If you would like for your care team to wear a mask while they are taking care of you, please let them know. You may have one support person who is at least 65 years old accompany you for your appointments.

## 2022-09-07 NOTE — Progress Notes (Signed)
Patient presents today for pump d/c. Blood pressure on arrival elevated. Patient did not take his blood pressure medication today. Patient denies headache, blurry vision, or dizziness. Reported blood pressure to R. Pennington PA. Patient stated he will go by his home on the way back to work and take his medication. Instructed patient to check his blood pressure after work at home with his blood pressure machine. Understanding verbalized. Port a cath site clean, dry, and intact. Port flushed with 10 mls of Normal Saline and 500 Units of Heparin. Needle removed intact. Band aid applied. Patient has no complaints at this time. Discharged from clinic ambulatory and in stable condition. Patient alert and oriented.

## 2022-09-16 ENCOUNTER — Other Ambulatory Visit: Payer: Self-pay

## 2022-09-19 ENCOUNTER — Inpatient Hospital Stay: Payer: Medicare Other

## 2022-09-19 ENCOUNTER — Inpatient Hospital Stay: Payer: Medicare Other | Attending: Hematology | Admitting: Hematology

## 2022-09-19 VITALS — BP 121/83 | HR 62 | Temp 97.7°F | Resp 18

## 2022-09-19 DIAGNOSIS — E876 Hypokalemia: Secondary | ICD-10-CM | POA: Insufficient documentation

## 2022-09-19 DIAGNOSIS — C189 Malignant neoplasm of colon, unspecified: Secondary | ICD-10-CM

## 2022-09-19 DIAGNOSIS — Z95828 Presence of other vascular implants and grafts: Secondary | ICD-10-CM

## 2022-09-19 DIAGNOSIS — Z87891 Personal history of nicotine dependence: Secondary | ICD-10-CM | POA: Diagnosis not present

## 2022-09-19 DIAGNOSIS — Z79899 Other long term (current) drug therapy: Secondary | ICD-10-CM | POA: Insufficient documentation

## 2022-09-19 DIAGNOSIS — C7972 Secondary malignant neoplasm of left adrenal gland: Secondary | ICD-10-CM | POA: Diagnosis not present

## 2022-09-19 DIAGNOSIS — Z5112 Encounter for antineoplastic immunotherapy: Secondary | ICD-10-CM | POA: Diagnosis present

## 2022-09-19 DIAGNOSIS — C7801 Secondary malignant neoplasm of right lung: Secondary | ICD-10-CM | POA: Diagnosis not present

## 2022-09-19 DIAGNOSIS — I10 Essential (primary) hypertension: Secondary | ICD-10-CM | POA: Diagnosis not present

## 2022-09-19 DIAGNOSIS — C180 Malignant neoplasm of cecum: Secondary | ICD-10-CM | POA: Insufficient documentation

## 2022-09-19 DIAGNOSIS — Z5111 Encounter for antineoplastic chemotherapy: Secondary | ICD-10-CM | POA: Diagnosis present

## 2022-09-19 DIAGNOSIS — R1013 Epigastric pain: Secondary | ICD-10-CM | POA: Insufficient documentation

## 2022-09-19 LAB — COMPREHENSIVE METABOLIC PANEL
ALT: 8 U/L (ref 0–44)
AST: 20 U/L (ref 15–41)
Albumin: 4 g/dL (ref 3.5–5.0)
Alkaline Phosphatase: 46 U/L (ref 38–126)
Anion gap: 5 (ref 5–15)
BUN: 11 mg/dL (ref 8–23)
CO2: 23 mmol/L (ref 22–32)
Calcium: 8.7 mg/dL — ABNORMAL LOW (ref 8.9–10.3)
Chloride: 110 mmol/L (ref 98–111)
Creatinine, Ser: 0.84 mg/dL (ref 0.61–1.24)
GFR, Estimated: >60 mL/min (ref >60)
Glucose, Bld: 94 mg/dL (ref 70–99)
Potassium: 3.6 mmol/L (ref 3.5–5.1)
Sodium: 138 mmol/L (ref 135–145)
Total Bilirubin: 0.9 mg/dL (ref 0.3–1.2)
Total Protein: 7.2 g/dL (ref 6.5–8.1)

## 2022-09-19 LAB — CBC WITH DIFFERENTIAL/PLATELET
Abs Immature Granulocytes: 0.02 10*3/uL (ref 0.00–0.07)
Basophils Absolute: 0.1 10*3/uL (ref 0.0–0.1)
Basophils Relative: 2 %
Eosinophils Absolute: 0.3 10*3/uL (ref 0.0–0.5)
Eosinophils Relative: 7 %
HCT: 32.7 % — ABNORMAL LOW (ref 39.0–52.0)
Hemoglobin: 10.5 g/dL — ABNORMAL LOW (ref 13.0–17.0)
Immature Granulocytes: 1 %
Lymphocytes Relative: 48 %
Lymphs Abs: 2.1 10*3/uL (ref 0.7–4.0)
MCH: 31.7 pg (ref 26.0–34.0)
MCHC: 32.1 g/dL (ref 30.0–36.0)
MCV: 98.8 fL (ref 80.0–100.0)
Monocytes Absolute: 0.7 10*3/uL (ref 0.1–1.0)
Monocytes Relative: 17 %
Neutro Abs: 1.1 10*3/uL — ABNORMAL LOW (ref 1.7–7.7)
Neutrophils Relative %: 25 %
Platelets: 148 10*3/uL — ABNORMAL LOW (ref 150–400)
RBC: 3.31 MIL/uL — ABNORMAL LOW (ref 4.22–5.81)
RDW: 16.6 % — ABNORMAL HIGH (ref 11.5–15.5)
WBC: 4.3 10*3/uL (ref 4.0–10.5)
nRBC: 0 % (ref 0.0–0.2)

## 2022-09-19 LAB — URINALYSIS, DIPSTICK ONLY
Bilirubin Urine: NEGATIVE
Glucose, UA: NEGATIVE mg/dL
Hgb urine dipstick: NEGATIVE
Ketones, ur: NEGATIVE mg/dL
Leukocytes,Ua: NEGATIVE
Nitrite: NEGATIVE
Protein, ur: NEGATIVE mg/dL
Specific Gravity, Urine: 1.016 (ref 1.005–1.030)
pH: 5 (ref 5.0–8.0)

## 2022-09-19 LAB — MAGNESIUM: Magnesium: 2.1 mg/dL (ref 1.7–2.4)

## 2022-09-19 MED ORDER — SODIUM CHLORIDE 0.9% FLUSH: 3.0000 mL | INTRAVENOUS | Status: DC | PRN

## 2022-09-19 MED ORDER — PALONOSETRON HCL INJECTION 0.25 MG/5ML
0.2500 mg | Freq: Once | INTRAVENOUS | Status: AC
  Administered 2022-09-19: 0.25 mg via INTRAVENOUS
  Filled 2022-09-19: qty 5

## 2022-09-19 MED ORDER — SODIUM CHLORIDE 0.9 % IV SOLN
5.0000 mg/kg | Freq: Once | INTRAVENOUS | Status: AC
  Administered 2022-09-19: 400 mg via INTRAVENOUS
  Filled 2022-09-19: qty 16

## 2022-09-19 MED ORDER — ATROPINE SULFATE 1 MG/ML IV SOLN: 0.5000 mg | Freq: Once | INTRAVENOUS | Status: DC | PRN

## 2022-09-19 MED ORDER — SODIUM CHLORIDE 0.9 % IV SOLN
400.0000 mg/m2 | Freq: Once | INTRAVENOUS | Status: AC
  Administered 2022-09-19: 780 mg via INTRAVENOUS
  Filled 2022-09-19: qty 39

## 2022-09-19 MED ORDER — FLUOROURACIL CHEMO INJECTION 2.5 GM/50ML
400.0000 mg/m2 | Freq: Once | INTRAVENOUS | Status: AC
  Administered 2022-09-19: 800 mg via INTRAVENOUS
  Filled 2022-09-19: qty 16

## 2022-09-19 MED ORDER — SODIUM CHLORIDE 0.9 % IV SOLN: Freq: Once | INTRAVENOUS | Status: AC

## 2022-09-19 MED ORDER — OXYCODONE HCL 10 MG PO TABS: 10.0000 mg | ORAL_TABLET | Freq: Four times a day (QID) | ORAL | 0 refills | Status: DC | PRN

## 2022-09-19 MED ORDER — SODIUM CHLORIDE 0.9 % IV SOLN
5000.0000 mg | INTRAVENOUS | Status: DC
  Administered 2022-09-19: 5000 mg via INTRAVENOUS
  Filled 2022-09-19: qty 100

## 2022-09-19 MED ORDER — ALTEPLASE 2 MG IJ SOLR: 2.0000 mg | Freq: Once | INTRAMUSCULAR | Status: DC | PRN

## 2022-09-19 MED ORDER — HEPARIN SOD (PORK) LOCK FLUSH 100 UNIT/ML IV SOLN: 250.0000 [IU] | Freq: Once | INTRAVENOUS | Status: DC | PRN

## 2022-09-19 MED ORDER — SODIUM CHLORIDE 0.9 % IV SOLN
10.0000 mg | Freq: Once | INTRAVENOUS | Status: AC
  Administered 2022-09-19: 10 mg via INTRAVENOUS
  Filled 2022-09-19: qty 10

## 2022-09-19 NOTE — Progress Notes (Signed)
Patient presents today for treatment and follow up visit with Dr. Delton Coombes. ANC 1.1. Labs reviewed by MD and message received to proceed with treatment pending CMP. CMP results within parameters for treatment.   Treatment given today per MD orders. Tolerated infusion without adverse affects. 5FU pump placed and RUN noted on the screen and verified with patient. Vital signs stable. No complaints at this time. Discharged from clinic ambulatory in stable condition. Alert and oriented x 3. F/U with [facility] as scheduled.

## 2022-09-19 NOTE — Patient Instructions (Signed)
[place] at [facility] Discharge Instructions   You were seen and examined today by Dr. Delton Coombes.  He reviewed partial results of your lab work which are normal/stable.   We will proceed with your treatment today.  We will repeat a CT scan prior to your next office visit.  Return as scheduled.    Thank you for choosing [place] at [facility] to provide your oncology and hematology care.  To afford each patient quality time with our provider, please arrive at least 15 minutes before your scheduled appointment time.   If you have a lab appointment with the [facility] please come in thru the Main Entrance and check in at the main information desk.  You need to re-schedule your appointment should you arrive 10 or more minutes late.  We strive to give you quality time with our providers, and arriving late affects you and other patients whose appointments are after yours.  Also, if you no show three or more times for appointments you may be dismissed from the clinic at the providers discretion.     Again, thank you for choosing [facility].  Our hope is that these requests will decrease the amount of time that you wait before being seen by our physicians.       _____________________________________________________________  Should you have questions after your visit to [facility], please contact our office at [phone] and follow the prompts.  Our office hours are 8:00 a.m. and 4:30 p.m. Monday - Friday.  Please note that voicemails left after 4:00 p.m. may not be returned until the following business day.  We are closed weekends and major holidays.  You do have access to a nurse 24-7, just call the main number to the clinic [phone] and do not press any options, hold on the line and a nurse will answer the phone.    For prescription refill requests, have your pharmacy contact our office and allow 72 hours.     Due to Covid, you will need to wear a mask upon entering the hospital. If you do not have a mask, a mask will be given to you at the Main Entrance upon arrival. For doctor visits, patients may have 1 support person age 51 or older with them. For treatment visits, patients can not have anyone with them due to social distancing guidelines and our immunocompromised population.

## 2022-09-19 NOTE — Progress Notes (Signed)
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  HER2 negative.  TMB low.  MSI-stable.  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  Other targetable mutations negative.  CURRENT THERAPY: K-ras G12 D mutation.  HER2 negative.  TMB low.  MSI-stable.  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  Other targetable mutations negative.  BRIEF ONCOLOGIC HISTORY:  Oncology History  Cecal cancer ([place])  10/07/2019 Initial Diagnosis   Cecal cancer ([place])   10/07/2019 Cancer Staging   Staging form: Colon and Rectum, AJCC 8th Edition - Clinical stage from 10/07/2019: Stage IIIB (cT3, cN1, cM0) - Signed by Derek Jack, MD on 10/07/2019   02/22/2022 - 08/08/2022 Chemotherapy   Patient is on Treatment Plan : COLORECTAL FOLFIRI / BEVACIZUMAB Q14D     02/22/2022 -  Chemotherapy   Patient is on Treatment Plan : COLORECTAL FOLFIRI + Bevacizumab q14d       CANCER STAGING:  Cancer Staging  Cecal cancer ([place]) Staging form: Colon and Rectum, AJCC 8th Edition - Clinical stage from 10/07/2019: Stage IIIB (cT3, cN1, cM0) - Signed by Derek Jack, MD on 10/07/2019 - Pathologic stage from 01/23/2022: Stage IVB (rpTX, pN0, pM1b) - Unsigned   INTERVAL HISTORY:  [name], a 66 y.o. male, seen for follow-up of metastatic colon cancer.  The chest pain is well controlled with pain medication.  Energy levels are 100%.  He is feeling much better since we cut back his treatment to maintenance regimen.  He has not been taking potassium for the last 4 weeks.  He has some constipation.  If he takes lactulose, it gives diarrhea.  He is also not requiring Megace.  Oxycodone makes him function throughout the day.  REVIEW OF  SYSTEMS:  Review of Systems  Constitutional:  Negative for appetite change and unexpected weight change.  Cardiovascular:  Positive for chest pain (Sternal pain).  Gastrointestinal:  Positive for constipation and diarrhea.  Neurological:  Negative for headaches.  Psychiatric/Behavioral:  Positive for sleep disturbance.   All other systems reviewed and are negative.   PAST MEDICAL/SURGICAL HISTORY:  Past Medical History:  Diagnosis Date   Arthritis    Colon cancer ([place])    colon   Hepatitis C    Hypertension    Port-A-Cath in place 02/15/2022   Past Surgical History:  Procedure Laterality Date   COLON SURGERY     PORTACATH PLACEMENT Right 02/08/2022   Procedure: INSERTION PORT-A-CATH- RIJ;  Surgeon: Rusty Aus, DO;  Location: [facility];  Service: General;  Laterality: Right;   REPLACEMENT TOTAL KNEE Left    SHOULDER ARTHROSCOPY Bilateral    TOTAL HIP ARTHROPLASTY Right 11/09/2020   Procedure: TOTAL HIP ARTHROPLASTY ANTERIOR APPROACH;  Surgeon: Renette Butters, MD;  Location: [facility];  Service: Orthopedics;  Laterality: Right;   WRIST SURGERY Left     SOCIAL HISTORY:  Social History   Socioeconomic History   Marital status: Married    Spouse name: Not on file   Number of children: 7   Years of education: Not on file   Highest education level: Not on file  Occupational History   Occupation: employed  Tobacco Use   Smoking status: Former  Packs/day: 1.50    Years: 20.00    Total pack years: 30.00    Types: Cigarettes    Quit date: 11/19/2005    Years since quitting: 16.8   Smokeless tobacco: Never  Vaping Use   Vaping Use: Never used  Substance and Sexual Activity   Alcohol use: Not Currently   Drug use: Never   Sexual activity: Yes  Other Topics Concern   Not on file  Social History Narrative   ** Merged History Encounter **       Separated from wife 11/2021   Social Determinants of Health   Financial Resource Strain: Low Risk  (10/07/2019)    Overall Financial Resource Strain (CARDIA)    Difficulty of Paying Living Expenses: Not very hard  Food Insecurity: No Food Insecurity (10/07/2019)   Hunger Vital Sign    Worried About Running Out of Food in the Last Year: Never true    Ran Out of Food in the Last Year: Never true  Transportation Needs: No Transportation Needs (10/07/2019)   PRAPARE - Hydrologist (Medical): No    Lack of Transportation (Non-Medical): No  Physical Activity: Inactive (10/07/2019)   Exercise Vital Sign    Days of Exercise per Week: 0 days    Minutes of Exercise per Session: 0 min  Stress: No Stress Concern Present (10/07/2019)   [place]    Feeling of Stress : Not at all  Social Connections: Moderately Integrated (10/07/2019)   Social Connection and Isolation Panel [NHANES]    Frequency of Communication with Friends and Family: Once a week    Frequency of Social Gatherings with Friends and Family: Once a week    Attends Religious Services: More than 4 times per year    Active Member of [employer] or Organizations: Yes    Attends Music therapist: More than 4 times per year    Marital Status: Married  Human resources officer Violence: Not At Risk (10/07/2019)   Humiliation, Afraid, Rape, and Kick questionnaire    Fear of Current or Ex-Partner: No    Emotionally Abused: No    Physically Abused: No    Sexually Abused: No    FAMILY HISTORY:  Family History  Problem Relation Age of Onset   Heart disease Mother    Dementia Father    Heart disease Sister    Cancer Brother    Hypertension Brother    Hypertension Brother    Healthy Son    Healthy Son    Healthy Son    Healthy Son    Healthy Daughter    Healthy Daughter    Healthy Daughter     CURRENT MEDICATIONS:  Current Outpatient Medications  Medication Sig Dispense Refill   aluminum-magnesium hydroxide-simethicone (MAALOX) 440-102-72 MG/5ML  SUSP Take 30 mLs by mouth 4 (four) times daily -  before meals and at bedtime. 1680 mL 2   amLODipine (NORVASC) 10 MG tablet Take 1 tablet (10 mg total) by mouth daily. 90 tablet 3   Bevacizumab (AVASTIN IV) Inject into the vein every 14 (fourteen) days. *start date TBD     ENULOSE 10 GM/15ML SOLN Take by mouth.     fluorouracil CALGB 53664 2,400 mg/m2 in sodium chloride 0.9 % 150 mL Inject 2,400 mg/m2 into the vein over 48 hr.     FLUOROURACIL IV Inject into the vein every 14 (fourteen) days.     IRINOTECAN HCL IV Inject into  the vein every 14 (fourteen) days.     Lactulose 20 GM/30ML SOLN Take 15 mLs (10 g total) by mouth at bedtime. Take 15 ml at bedtime every night to assist with regular bowel movements.  Titrate down if having multiple bowel movements.  If a bowel movement has not occurred in 3 to 4 days or longer, then take 15 ml every 3 hours until a bowel movent has occurred. 450 mL 1   LEUCOVORIN CALCIUM IV Inject into the vein every 14 (fourteen) days.     megestrol (MEGACE) 400 MG/10ML suspension Take 10 mLs (400 mg total) by mouth 2 (two) times daily. 480 mL 3   potassium chloride SA (KLOR-CON M) 20 MEQ tablet Take 1 tablet (20 mEq total) by mouth 2 (two) times daily. 30 tablet 3   traZODone (DESYREL) 50 MG tablet Take 1 tablet (50 mg total) by mouth at bedtime. 30 tablet 3   vitamin B-12 (CYANOCOBALAMIN) 1000 MCG tablet Take 1 tablet (1,000 mcg total) by mouth daily. 30 tablet 6   Oxycodone HCl 10 MG TABS Take 1 tablet (10 mg total) by mouth every 6 (six) hours as needed. 120 tablet 0   No current facility-administered medications for this visit.    ALLERGIES:  No Known Allergies  PHYSICAL EXAM:  Performance status (ECOG): 0 - Asymptomatic  There were no vitals filed for this visit.  Wt Readings from Last 3 Encounters:  09/19/22 177 lb 12.8 oz (80.6 kg)  09/05/22 178 lb 1.6 oz (80.8 kg)  08/22/22 169 lb 11.2 oz (77 kg)   Physical Exam Vitals reviewed.  Constitutional:       Appearance: Normal appearance.  Cardiovascular:     Rate and Rhythm: Normal rate and regular rhythm.     Pulses: Normal pulses.     Heart sounds: Normal heart sounds.  Pulmonary:     Effort: Pulmonary effort is normal.     Breath sounds: Normal breath sounds.  Neurological:     General: No focal deficit present.     Mental Status: He is alert and oriented to person, place, and time.  Psychiatric:        Mood and Affect: Mood normal.        Behavior: Behavior normal.     LABORATORY DATA:  I have reviewed the labs as listed.     Latest Ref Rng & Units 09/19/2022    8:52 AM 09/05/2022    9:31 AM 08/22/2022    9:04 AM  CBC  WBC 4.0 - 10.5 K/uL 4.3  4.2  11.7   Hemoglobin 13.0 - 17.0 g/dL 10.5  9.4  9.7   Hematocrit 39.0 - 52.0 % 32.7  29.2  30.2   Platelets 150 - 400 K/uL 148  138  220       Latest Ref Rng & Units 09/05/2022    9:31 AM 08/22/2022    9:04 AM 08/08/2022    7:51 AM  CMP  Glucose 70 - 99 mg/dL 76  98  104   BUN 8 - 23 mg/dL '10  9  9   ' Creatinine 0.61 - 1.24 mg/dL 1.00  1.17  0.98   Sodium 135 - 145 mmol/L 139  135  134   Potassium 3.5 - 5.1 mmol/L 3.6  3.2  3.6   Chloride 98 - 111 mmol/L 106  103  104   CO2 22 - 32 mmol/L '26  24  21   ' Calcium 8.9 - 10.3 mg/dL 8.6  8.8  9.0   Total Protein 6.5 - 8.1 g/dL 6.9  7.4  7.9   Total Bilirubin 0.3 - 1.2 mg/dL 0.6  0.4  0.5   Alkaline Phos 38 - 126 U/L 46  65  65   AST 15 - 41 U/L '18  18  16   ' ALT 0 - 44 U/L '10  8  10     ' DIAGNOSTIC IMAGING:  I have independently reviewed the scans and discussed with the patient. No results found.   ASSESSMENT:  Left lung mass with multiple lung nodules: - Presentation with dry cough for 6 months. - 30 pound weight loss in the last couple of years, weight stable over the last 6 months. - CT chest with contrast on 12/16/2021 showed bulky left hilar mass measuring 8.1 x 7.5 cm.  Numerous bilateral lung nodules of varying sizes.  Left adrenal nodule measuring 2.8 x 2.2 cm.   Mediastinal and bilateral hilar adenopathy with the largest pretracheal node measuring 4.1 x 3.1 cm. - MRI of the brain from 01/04/2022 which was negative for metastatic disease. - CTAP from 01/03/2022 which showed isolated left adrenal metastasis with no other evidence of metastatic disease in the abdomen or pelvis. - Pathology of left lung biopsy which shows adenocarcinoma with necrosis.  CK20 positive and CDX2 positive but negative for CK7 indicating colonic primary. 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  HER2 negative.  TMB low.  MSI-stable.  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  Other targetable mutations negative. - FOLFIRI started on 02/22/2022, bevacizumab added with cycle 4  - CT CAP (05/17/2022): Mediastinal and hilar lymph nodes have decreased in size.  Largest perihilar left lower lobe lung mass has decreased in size.  Right upper lobe mass slightly increased in size.  Other nodules are stable.  Left adrenal mass decreased to 1.3 cm from 2.8 cm. - CT scan showed mixed response as it was compared to CT from 12/16/2021.  He did not start chemotherapy until 02/22/2022.  Social/family history: - Lives by himself.  He paints houses for living. - He quit smoking 12 years back and started back again 1 and half year ago and smoked half pack per day.  He quit again about 1 week ago. - Father had cancer, type unknown to the patient.  Brother died of brain tumor.  3.  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  Margins negative.  No lymphovascular or perineural invasion. - Received 3 cycles of XELOX followed by Xeloda for total of 6 months.  Oxaliplatin discontinued during cycle 4 secondary to transaminitis, elevated bilirubin and thrombocytopenia.  However he was also treated for hep C with Harvoni after that.   PLAN:  Metastatic colon cancer to the lungs and left adrenal gland: -He has tolerated last cycle very well.  We have  started him on maintenance 5-FU and leucovorin with bevacizumab about a month ago.  He has been doing very well since then. - Reviewed labs today which showed normal LFTs.  Magnesium is normal.  CBC was grossly normal with hemoglobin improved to 10.5.  UA was negative for protein. - Last CEA was 13.9 on 07/25/2022. - Continue maintenance 5-FU/bevacizumab every 2 weeks.  RTC 6 weeks for follow-up.  Repeat CT CAP and CEA level prior to next visit.  2.  Lower rib/epigastric pain: - Continue oxycodone 10 mg every 6 hours as needed which helps him function well.  Pain is well controlled.  3.  Hypokalemia: - He quit taking potassium 4 weeks ago.  Potassium today is normal at 3.6.  4.  Difficulty falling asleep: - Continue trazodone as needed.  5.  Hypertension: - Continue Norvasc 10 mg daily.  Blood pressure is well controlled.   Orders placed this encounter:  No orders of the defined types were placed in this encounter.    Derek Jack, MD [place] [phone]

## 2022-09-19 NOTE — Patient Instructions (Addendum)
[facility]  Discharge Instructions: Thank you for choosing [facility] to provide your oncology and hematology care.  If you have a lab appointment with the [facility], please come in thru the Main Entrance and check in at the main information desk.  Wear comfortable clothing and clothing appropriate for easy access to any Portacath or PICC line.   We strive to give you quality time with your provider. You may need to reschedule your appointment if you arrive late (15 or more minutes).  Arriving late affects you and other patients whose appointments are after yours.  Also, if you miss three or more appointments without notifying the office, you may be dismissed from the clinic at the provider's discretion.      For prescription refill requests, have your pharmacy contact our office and allow 72 hours for refills to be completed.    Today you received the following chemotherapy and/or immunotherapy agents Avastin, Leucovorin, Adrucil 5FU.  The chemotherapy medication bag should finish at 46 hours, 96 hours, or 7 days. For example, if your pump is scheduled for 46 hours and it was put on at 4:00 p.m., it should finish at 2:00 p.m. the day it is scheduled to come off regardless of your appointment time.     Estimated time to finish at 11:05 am.   If the display on your pump reads "Low Volume" and it is beeping, take the batteries out of the pump and come to the cancer center for it to be taken off.   If the pump alarms go off prior to the pump reading "Low Volume" then call [phone] and someone can assist you.  If the plunger comes out and the chemotherapy medication is leaking out, please use your home chemo spill kit to clean up the spill. Do NOT use paper towels or other household products.  If you have problems or questions regarding your pump, please call either [phone] (24 hours a day) or the cancer center Monday-Friday 8:00 a.m.- 4:30 p.m.  at the clinic number and we will assist you. If you are unable to get assistance, then go to the nearest Emergency Department and ask the staff to contact the IV team for assistance.   Fluorouracil Injection What is this medication? FLUOROURACIL (flure oh YOOR a sil) treats some types of cancer. It works by slowing down the growth of cancer cells. This medicine may be used for other purposes; ask your health care provider or pharmacist if you have questions. COMMON BRAND NAME(S): Adrucil What should I tell my care team before I take this medication? They need to know if you have any of these conditions: Blood disorders Dihydropyrimidine dehydrogenase (DPD) deficiency Infection, such as chickenpox, cold sores, herpes Kidney disease Liver disease Poor nutrition Recent or ongoing radiation therapy An unusual or allergic reaction to fluorouracil, other medications, foods, dyes, or preservatives If you or your partner are pregnant or trying to get pregnant Breast-feeding How should I use this medication? This medication is injected into a vein. It is administered by your care team in a hospital or clinic setting. Talk to your care team about the use of this medication in children. Special care may be needed. Overdosage: If you think you have taken too much of this medicine contact a poison control center or emergency room at once. NOTE: This medicine is only for you. Do not share this medicine with others. What if I miss a dose? Keep appointments for follow-up  doses. It is important not to miss your dose. Call your care team if you are unable to keep an appointment. What may interact with this medication? Do not take this medication with any of the following: Live virus vaccines This medication may also interact with the following: Medications that treat or prevent blood clots, such as warfarin, enoxaparin, dalteparin This list may not describe all possible interactions. Give your health  care provider a list of all the medicines, herbs, non-prescription drugs, or dietary supplements you use. Also tell them if you smoke, drink alcohol, or use illegal drugs. Some items may interact with your medicine. What should I watch for while using this medication? Your condition will be monitored carefully while you are receiving this medication. This medication may make you feel generally unwell. This is not uncommon as chemotherapy can affect healthy cells as well as cancer cells. Report any side effects. Continue your course of treatment even though you feel ill unless your care team tells you to stop. In some cases, you may be given additional medications to help with side effects. Follow all directions for their use. This medication may increase your risk of getting an infection. Call your care team for advice if you get a fever, chills, sore throat, or other symptoms of a cold or flu. Do not treat yourself. Try to avoid being around people who are sick. This medication may increase your risk to bruise or bleed. Call your care team if you notice any unusual bleeding. Be careful brushing or flossing your teeth or using a toothpick because you may get an infection or bleed more easily. If you have any dental work done, tell your dentist you are receiving this medication. Avoid taking medications that contain aspirin, acetaminophen, ibuprofen, naproxen, or ketoprofen unless instructed by your care team. These medications may hide a fever. Do not treat diarrhea with over the counter products. Contact your care team if you have diarrhea that lasts more than 2 days or if it is severe and watery. This medication can make you more sensitive to the sun. Keep out of the sun. If you cannot avoid being in the sun, wear protective clothing and sunscreen. Do not use sun lamps, tanning beds, or tanning booths. Talk to your care team if you or your partner wish to become pregnant or think you might be pregnant.  This medication can cause serious birth defects if taken during pregnancy and for 3 months after the last dose. A reliable form of contraception is recommended while taking this medication and for 3 months after the last dose. Talk to your care team about effective forms of contraception. Do not father a child while taking this medication and for 3 months after the last dose. Use a condom while having sex during this time period. Do not breastfeed while taking this medication. This medication may cause infertility. Talk to your care team if you are concerned about your fertility. What side effects may I notice from receiving this medication? Side effects that you should report to your care team as soon as possible: Allergic reactions--skin rash, itching, hives, swelling of the face, lips, tongue, or throat Heart attack--pain or tightness in the chest, shoulders, arms, or jaw, nausea, shortness of breath, cold or clammy skin, feeling faint or lightheaded Heart failure--shortness of breath, swelling of the ankles, feet, or hands, sudden weight gain, unusual weakness or fatigue Heart rhythm changes--fast or irregular heartbeat, dizziness, feeling faint or lightheaded, chest pain, trouble breathing High ammonia  level--unusual weakness or fatigue, confusion, loss of appetite, nausea, vomiting, seizures Infection--fever, chills, cough, sore throat, wounds that don't heal, pain or trouble when passing urine, general feeling of discomfort or being unwell Low red blood cell level--unusual weakness or fatigue, dizziness, headache, trouble breathing Pain, tingling, or numbness in the hands or feet, muscle weakness, change in vision, confusion or trouble speaking, loss of balance or coordination, trouble walking, seizures Redness, swelling, and blistering of the skin over hands and feet Severe or prolonged diarrhea Unusual bruising or bleeding Side effects that usually do not require medical attention (report to  your care team if they continue or are bothersome): Dry skin Headache Increased tears Nausea Pain, redness, or swelling with sores inside the mouth or throat Sensitivity to light Vomiting This list may not describe all possible side effects. Call your doctor for medical advice about side effects. You may report side effects to FDA at 1-800-FDA-1088. Where should I keep my medication? This medication is given in a hospital or clinic. It will not be stored at home. NOTE: This sheet is a summary. It may not cover all possible information. If you have questions about this medicine, talk to your doctor, pharmacist, or health care provider.  2023 Elsevier/Gold Standard (2022-04-04 00:00:00) Leucovorin Injection What is this medication? LEUCOVORIN (loo koe VOR in) prevents side effects from certain medications, such as methotrexate. It works by increasing folate levels. This helps protect healthy cells in your body. It may also be used to treat anemia caused by low levels of folate. It can also be used with fluorouracil, a type of chemotherapy, to treat colorectal cancer. It works by increasing the effects of fluorouracil in the body. This medicine may be used for other purposes; ask your health care provider or pharmacist if you have questions. What should I tell my care team before I take this medication? They need to know if you have any of these conditions: Anemia from low levels of vitamin B12 in the blood An unusual or allergic reaction to leucovorin, folic acid, other medications, foods, dyes, or preservatives Pregnant or trying to get pregnant Breastfeeding How should I use this medication? This medication is injected into a vein or a muscle. It is given by your care team in a hospital or clinic setting. Talk to your care team about the use of this medication in children. Special care may be needed. Overdosage: If you think you have taken too much of this medicine contact a poison control  center or emergency room at once. NOTE: This medicine is only for you. Do not share this medicine with others. What if I miss a dose? Keep appointments for follow-up doses. It is important not to miss your dose. Call your care team if you are unable to keep an appointment. What may interact with this medication? Capecitabine Fluorouracil Phenobarbital Phenytoin Primidone Trimethoprim;sulfamethoxazole This list may not describe all possible interactions. Give your health care provider a list of all the medicines, herbs, non-prescription drugs, or dietary supplements you use. Also tell them if you smoke, drink alcohol, or use illegal drugs. Some items may interact with your medicine. What should I watch for while using this medication? Your condition will be monitored carefully while you are receiving this medication. This medication may increase the side effects of 5-fluorouracil. Tell your care team if you have diarrhea or mouth sores that do not get better or that get worse. What side effects may I notice from receiving this medication? Side effects that  you should report to your care team as soon as possible: Allergic reactions--skin rash, itching, hives, swelling of the face, lips, tongue, or throat This list may not describe all possible side effects. Call your doctor for medical advice about side effects. You may report side effects to FDA at 1-800-FDA-1088. Where should I keep my medication? This medication is given in a hospital or clinic. It will not be stored at home. NOTE: This sheet is a summary. It may not cover all possible information. If you have questions about this medicine, talk to your doctor, pharmacist, or health care provider.  2023 Elsevier/Gold Standard (2022-04-07 00:00:00)  Bevacizumab Injection What is this medication? BEVACIZUMAB (be va SIZ yoo mab) treats some types of cancer. It works by blocking a protein that causes cancer cells to grow and multiply. This  helps to slow or stop the spread of cancer cells. It is a monoclonal antibody. This medicine may be used for other purposes; ask your health care provider or pharmacist if you have questions. COMMON BRAND NAME(S): Alymsys, Avastin, MVASI, Noah Charon What should I tell my care team before I take this medication? They need to know if you have any of these conditions: Blood clots Coughing up blood Having or recent surgery Heart failure High blood pressure History of a connection between 2 or more body parts that do not usually connect (fistula) History of a tear in your stomach or intestines Protein in your urine An unusual or allergic reaction to bevacizumab, other medications, foods, dyes, or preservatives Pregnant or trying to get pregnant Breast-feeding How should I use this medication? This medication is injected into a vein. It is given by your care team in a hospital or clinic setting. Talk to your care team the use of this medication in children. Special care may be needed. Overdosage: If you think you have taken too much of this medicine contact a poison control center or emergency room at once. NOTE: This medicine is only for you. Do not share this medicine with others. What if I miss a dose? Keep appointments for follow-up doses. It is important not to miss your dose. Call your care team if you are unable to keep an appointment. What may interact with this medication? Interactions are not expected. This list may not describe all possible interactions. Give your health care provider a list of all the medicines, herbs, non-prescription drugs, or dietary supplements you use. Also tell them if you smoke, drink alcohol, or use illegal drugs. Some items may interact with your medicine. What should I watch for while using this medication? Your condition will be monitored carefully while you are receiving this medication. You may need blood work while taking this medication. This medication  may make you feel generally unwell. This is not uncommon as chemotherapy can affect healthy cells as well as cancer cells. Report any side effects. Continue your course of treatment even though you feel ill unless your care team tells you to stop. This medication may increase your risk to bruise or bleed. Call your care team if you notice any unusual bleeding. Before having surgery, talk to your care team to make sure it is ok. This medication can increase the risk of poor healing of your surgical site or wound. You will need to stop this medication for 28 days before surgery. After surgery, wait at least 28 days before restarting this medication. Make sure the surgical site or wound is healed enough before restarting this medication. Talk to your  care team if questions. Talk to your care team if you may be pregnant. Serious birth defects can occur if you take this medication during pregnancy and for 6 months after the last dose. Contraception is recommended while taking this medication and for 6 months after the last dose. Your care team can help you find the option that works for you. Do not breastfeed while taking this medication and for 6 months after the last dose. This medication can cause infertility. Talk to your care team if you are concerned about your fertility. What side effects may I notice from receiving this medication? Side effects that you should report to your care team as soon as possible: Allergic reactions--skin rash, itching, hives, swelling of the face, lips, tongue, or throat Bleeding--bloody or black, tar-like stools, vomiting blood or brown material that looks like coffee grounds, red or dark brown urine, small red or purple spots on skin, unusual bruising or bleeding Blood clot--pain, swelling, or warmth in the leg, shortness of breath, chest pain Heart attack--pain or tightness in the chest, shoulders, arms, or jaw, nausea, shortness of breath, cold or clammy skin, feeling  faint or lightheaded Heart failure--shortness of breath, swelling of the ankles, feet, or hands, sudden weight gain, unusual weakness or fatigue Increase in blood pressure Infection--fever, chills, cough, sore throat, wounds that don't heal, pain or trouble when passing urine, general feeling of discomfort or being unwell Infusion reactions--chest pain, shortness of breath or trouble breathing, feeling faint or lightheaded Kidney injury--decrease in the amount of urine, swelling of the ankles, hands, or feet Stomach pain that is severe, does not go away, or gets worse Stroke--sudden numbness or weakness of the face, arm, or leg, trouble speaking, confusion, trouble walking, loss of balance or coordination, dizziness, severe headache, change in vision Sudden and severe headache, confusion, change in vision, seizures, which may be signs of posterior reversible encephalopathy syndrome (PRES) Side effects that usually do not require medical attention (report to your care team if they continue or are bothersome): Back pain Change in taste Diarrhea Dry skin Increased tears Nosebleed This list may not describe all possible side effects. Call your doctor for medical advice about side effects. You may report side effects to FDA at 1-800-FDA-1088. Where should I keep my medication? This medication is given in a hospital or clinic. It will not be stored at home. NOTE: This sheet is a summary. It may not cover all possible information. If you have questions about this medicine, talk to your doctor, pharmacist, or health care provider.  2023 Elsevier/Gold Standard (2022-04-11 00:00:00)       To help prevent nausea and vomiting after your treatment, we encourage you to take your nausea medication as directed.  BELOW ARE SYMPTOMS THAT SHOULD BE REPORTED IMMEDIATELY: *FEVER GREATER THAN 100.4 F (38 C) OR HIGHER *CHILLS OR SWEATING *NAUSEA AND VOMITING THAT IS NOT CONTROLLED WITH YOUR NAUSEA  MEDICATION *UNUSUAL SHORTNESS OF BREATH *UNUSUAL BRUISING OR BLEEDING *URINARY PROBLEMS (pain or burning when urinating, or frequent urination) *BOWEL PROBLEMS (unusual diarrhea, constipation, pain near the anus) TENDERNESS IN MOUTH AND THROAT WITH OR WITHOUT PRESENCE OF ULCERS (sore throat, sores in mouth, or a toothache) UNUSUAL RASH, SWELLING OR PAIN  UNUSUAL VAGINAL DISCHARGE OR ITCHING   Items with * indicate a potential emergency and should be followed up as soon as possible or go to the Emergency Department if any problems should occur.  Please show the CHEMOTHERAPY ALERT CARD or IMMUNOTHERAPY ALERT CARD at check-in to  the Emergency Department and triage nurse.  Should you have questions after your visit or need to cancel or reschedule your appointment, please contact [facility] [phone]  and follow the prompts.  Office hours are 8:00 a.m. to 4:30 p.m. Monday - Friday. Please note that voicemails left after 4:00 p.m. may not be returned until the following business day.  We are closed weekends and major holidays. You have access to a nurse at all times for urgent questions. Please call the main number to the clinic [phone] and follow the prompts.  For any non-urgent questions, you may also contact your provider using MyChart. We now offer e-Visits for anyone 74 and older to request care online for non-urgent symptoms. For details visit [email or web address].   Also download the MyChart app! Go to the app store, search "MyChart", open the app, select [facility], and log in with your MyChart username and password.  Masks are optional in the cancer centers. If you would like for your care team to wear a mask while they are taking care of you, please let them know. You may have one support person who is at least 65 years old accompany you for your appointments.

## 2022-09-21 ENCOUNTER — Inpatient Hospital Stay: Payer: Medicare Other

## 2022-09-21 VITALS — BP 154/84 | HR 66 | Temp 98.4°F | Resp 18 | Wt 182.0 lb

## 2022-09-21 DIAGNOSIS — Z95828 Presence of other vascular implants and grafts: Secondary | ICD-10-CM

## 2022-09-21 DIAGNOSIS — C180 Malignant neoplasm of cecum: Secondary | ICD-10-CM

## 2022-09-21 MED ORDER — SODIUM CHLORIDE 0.9% FLUSH
10.0000 mL | INTRAVENOUS | Status: DC | PRN
  Administered 2022-09-21: 10 mL

## 2022-09-21 MED ORDER — HEPARIN SOD (PORK) LOCK FLUSH 100 UNIT/ML IV SOLN
500.0000 [IU] | Freq: Once | INTRAVENOUS | Status: AC | PRN
  Administered 2022-09-21: 500 [IU]

## 2022-09-21 NOTE — Progress Notes (Signed)
Evan [name] presents to have home infusion pump d/c'd and for port-a-cath deaccess with flush.  Portacath located right chest wall accessed with  H 20 needle.  Good blood return present. Portacath flushed with NS and 500U/7ml Heparin, and needle removed intact.  Procedure tolerated well and without incident.  Vitals stable and discharged home from clinic ambulatory. Follow up as scheduled.

## 2022-09-21 NOTE — Patient Instructions (Signed)
[facility]  Discharge Instructions: Thank you for choosing [facility] to provide your oncology and hematology care.  If you have a lab appointment with the [facility], please come in thru the Main Entrance and check in at the main information desk.  Wear comfortable clothing and clothing appropriate for easy access to any Portacath or PICC line.   We strive to give you quality time with your provider. You may need to reschedule your appointment if you arrive late (15 or more minutes).  Arriving late affects you and other patients whose appointments are after yours.  Also, if you miss three or more appointments without notifying the office, you may be dismissed from the clinic at the provider's discretion.      For prescription refill requests, have your pharmacy contact our office and allow 72 hours for refills to be completed.    Today you received the following pump d/c   To help prevent nausea and vomiting after your treatment, we encourage you to take your nausea medication as directed.  BELOW ARE SYMPTOMS THAT SHOULD BE REPORTED IMMEDIATELY: *FEVER GREATER THAN 100.4 F (38 C) OR HIGHER *CHILLS OR SWEATING *NAUSEA AND VOMITING THAT IS NOT CONTROLLED WITH YOUR NAUSEA MEDICATION *UNUSUAL SHORTNESS OF BREATH *UNUSUAL BRUISING OR BLEEDING *URINARY PROBLEMS (pain or burning when urinating, or frequent urination) *BOWEL PROBLEMS (unusual diarrhea, constipation, pain near the anus) TENDERNESS IN MOUTH AND THROAT WITH OR WITHOUT PRESENCE OF ULCERS (sore throat, sores in mouth, or a toothache) UNUSUAL RASH, SWELLING OR PAIN  UNUSUAL VAGINAL DISCHARGE OR ITCHING   Items with * indicate a potential emergency and should be followed up as soon as possible or go to the Emergency Department if any problems should occur.  Please show the CHEMOTHERAPY ALERT CARD or IMMUNOTHERAPY ALERT CARD at check-in to the Emergency Department and triage nurse.  Should you  have questions after your visit or need to cancel or reschedule your appointment, please contact [facility] [phone]  and follow the prompts.  Office hours are 8:00 a.m. to 4:30 p.m. Monday - Friday. Please note that voicemails left after 4:00 p.m. may not be returned until the following business day.  We are closed weekends and major holidays. You have access to a nurse at all times for urgent questions. Please call the main number to the clinic [phone] and follow the prompts.  For any non-urgent questions, you may also contact your provider using MyChart. We now offer e-Visits for anyone 57 and older to request care online for non-urgent symptoms. For details visit [email or web address].   Also download the MyChart app! Go to the app store, search "MyChart", open the app, select [facility], and log in with your MyChart username and password.  Masks are optional in the cancer centers. If you would like for your care team to wear a mask while they are taking care of you, please let them know. You may have one support person who is at least 65 years old accompany you for your appointments.

## 2022-09-26 ENCOUNTER — Encounter (HOSPITAL_COMMUNITY): Payer: Self-pay | Admitting: Hematology

## 2022-09-26 ENCOUNTER — Encounter: Payer: Self-pay | Admitting: Hematology

## 2022-10-03 ENCOUNTER — Inpatient Hospital Stay: Payer: Medicare Other

## 2022-10-03 ENCOUNTER — Ambulatory Visit: Payer: 59 | Admitting: Hematology

## 2022-10-03 ENCOUNTER — Encounter: Payer: Self-pay | Admitting: Hematology

## 2022-10-03 ENCOUNTER — Encounter (HOSPITAL_COMMUNITY): Payer: Self-pay | Admitting: Hematology

## 2022-10-03 VITALS — BP 143/85

## 2022-10-03 DIAGNOSIS — Z95828 Presence of other vascular implants and grafts: Secondary | ICD-10-CM

## 2022-10-03 DIAGNOSIS — C180 Malignant neoplasm of cecum: Secondary | ICD-10-CM

## 2022-10-03 DIAGNOSIS — E876 Hypokalemia: Secondary | ICD-10-CM

## 2022-10-03 DIAGNOSIS — C189 Malignant neoplasm of colon, unspecified: Secondary | ICD-10-CM

## 2022-10-03 LAB — CBC WITH DIFFERENTIAL/PLATELET
Abs Immature Granulocytes: 0.01 10*3/uL (ref 0.00–0.07)
Basophils Absolute: 0.1 10*3/uL (ref 0.0–0.1)
Basophils Relative: 2 %
Eosinophils Absolute: 0.2 10*3/uL (ref 0.0–0.5)
Eosinophils Relative: 4 %
HCT: 34.7 % — ABNORMAL LOW (ref 39.0–52.0)
Hemoglobin: 11.2 g/dL — ABNORMAL LOW (ref 13.0–17.0)
Immature Granulocytes: 0 %
Lymphocytes Relative: 44 %
Lymphs Abs: 2.3 10*3/uL (ref 0.7–4.0)
MCH: 31.9 pg (ref 26.0–34.0)
MCHC: 32.3 g/dL (ref 30.0–36.0)
MCV: 98.9 fL (ref 80.0–100.0)
Monocytes Absolute: 0.8 10*3/uL (ref 0.1–1.0)
Monocytes Relative: 15 %
Neutro Abs: 1.9 10*3/uL (ref 1.7–7.7)
Neutrophils Relative %: 35 %
Platelets: 121 10*3/uL — ABNORMAL LOW (ref 150–400)
RBC: 3.51 MIL/uL — ABNORMAL LOW (ref 4.22–5.81)
RDW: 15.9 % — ABNORMAL HIGH (ref 11.5–15.5)
WBC: 5.2 10*3/uL (ref 4.0–10.5)
nRBC: 0 % (ref 0.0–0.2)

## 2022-10-03 LAB — COMPREHENSIVE METABOLIC PANEL
ALT: 8 U/L (ref 0–44)
AST: 18 U/L (ref 15–41)
Albumin: 3.8 g/dL (ref 3.5–5.0)
Alkaline Phosphatase: 44 U/L (ref 38–126)
Anion gap: 6 (ref 5–15)
BUN: 11 mg/dL (ref 8–23)
CO2: 28 mmol/L (ref 22–32)
Calcium: 8.8 mg/dL — ABNORMAL LOW (ref 8.9–10.3)
Chloride: 106 mmol/L (ref 98–111)
Creatinine, Ser: 0.9 mg/dL (ref 0.61–1.24)
GFR, Estimated: >60 mL/min (ref >60)
Glucose, Bld: 105 mg/dL — ABNORMAL HIGH (ref 70–99)
Potassium: 3.3 mmol/L — ABNORMAL LOW (ref 3.5–5.1)
Sodium: 140 mmol/L (ref 135–145)
Total Bilirubin: 0.8 mg/dL (ref 0.3–1.2)
Total Protein: 7 g/dL (ref 6.5–8.1)

## 2022-10-03 LAB — URINALYSIS, DIPSTICK ONLY
Bilirubin Urine: NEGATIVE
Glucose, UA: NEGATIVE mg/dL
Ketones, ur: NEGATIVE mg/dL
Leukocytes,Ua: NEGATIVE
Nitrite: NEGATIVE
Protein, ur: 30 mg/dL — AB
Specific Gravity, Urine: 1.017 (ref 1.005–1.030)
pH: 5 (ref 5.0–8.0)

## 2022-10-03 MED ORDER — SODIUM CHLORIDE 0.9 % IV SOLN: Freq: Once | INTRAVENOUS | Status: AC

## 2022-10-03 MED ORDER — POTASSIUM CHLORIDE CRYS ER 20 MEQ PO TBCR
40.0000 meq | EXTENDED_RELEASE_TABLET | Freq: Once | ORAL | Status: AC
  Administered 2022-10-03: 40 meq via ORAL
  Filled 2022-10-03: qty 2

## 2022-10-03 MED ORDER — FLUOROURACIL CHEMO INJECTION 2.5 GM/50ML
400.0000 mg/m2 | Freq: Once | INTRAVENOUS | Status: AC
  Administered 2022-10-03: 800 mg via INTRAVENOUS
  Filled 2022-10-03: qty 16

## 2022-10-03 MED ORDER — SODIUM CHLORIDE 0.9 % IV SOLN
10.0000 mg | Freq: Once | INTRAVENOUS | Status: AC
  Administered 2022-10-03: 10 mg via INTRAVENOUS
  Filled 2022-10-03: qty 10

## 2022-10-03 MED ORDER — SODIUM CHLORIDE 0.9% FLUSH
10.0000 mL | INTRAVENOUS | Status: DC | PRN
  Administered 2022-10-03: 10 mL

## 2022-10-03 MED ORDER — SODIUM CHLORIDE 0.9 % IV SOLN
400.0000 mg/m2 | Freq: Once | INTRAVENOUS | Status: AC
  Administered 2022-10-03: 780 mg via INTRAVENOUS
  Filled 2022-10-03: qty 39

## 2022-10-03 MED ORDER — SODIUM CHLORIDE 0.9 % IV SOLN
5000.0000 mg | INTRAVENOUS | Status: DC
  Administered 2022-10-03: 5000 mg via INTRAVENOUS
  Filled 2022-10-03: qty 100

## 2022-10-03 MED ORDER — PALONOSETRON HCL INJECTION 0.25 MG/5ML
0.2500 mg | Freq: Once | INTRAVENOUS | Status: AC
  Administered 2022-10-03: 0.25 mg via INTRAVENOUS
  Filled 2022-10-03: qty 5

## 2022-10-03 MED ORDER — SODIUM CHLORIDE 0.9 % IV SOLN
5.0000 mg/kg | Freq: Once | INTRAVENOUS | Status: AC
  Administered 2022-10-03: 400 mg via INTRAVENOUS
  Filled 2022-10-03: qty 16

## 2022-10-03 NOTE — Progress Notes (Signed)
Patient presents today for treatment, labs within treatment parameters. Patient tolerated chemotherapy with no complaints voiced. Side effects with management reviewed understanding verbalized. Port site clean and dry with no bruising or swelling noted at site. Good blood return noted before and after administration of chemotherapy. Chemo pump connected. Patient left in satisfactory condition with VSS and no s/s of distress noted.

## 2022-10-03 NOTE — Patient Instructions (Signed)
[facility]  Discharge Instructions: Thank you for choosing [facility] to provide your oncology and hematology care.  If you have a lab appointment with the [facility], please come in thru the Main Entrance and check in at the main information desk.  Wear comfortable clothing and clothing appropriate for easy access to any Portacath or PICC line.   We strive to give you quality time with your provider. You may need to reschedule your appointment if you arrive late (15 or more minutes).  Arriving late affects you and other patients whose appointments are after yours.  Also, if you miss three or more appointments without notifying the office, you may be dismissed from the clinic at the provider's discretion.      For prescription refill requests, have your pharmacy contact our office and allow 72 hours for refills to be completed.    Today you received the following chemotherapy and/or immunotherapy agents bevacizumab, leucovorin and 5FU, return as scheduled.   To help prevent nausea and vomiting after your treatment, we encourage you to take your nausea medication as directed.  BELOW ARE SYMPTOMS THAT SHOULD BE REPORTED IMMEDIATELY: *FEVER GREATER THAN 100.4 F (38 C) OR HIGHER *CHILLS OR SWEATING *NAUSEA AND VOMITING THAT IS NOT CONTROLLED WITH YOUR NAUSEA MEDICATION *UNUSUAL SHORTNESS OF BREATH *UNUSUAL BRUISING OR BLEEDING *URINARY PROBLEMS (pain or burning when urinating, or frequent urination) *BOWEL PROBLEMS (unusual diarrhea, constipation, pain near the anus) TENDERNESS IN MOUTH AND THROAT WITH OR WITHOUT PRESENCE OF ULCERS (sore throat, sores in mouth, or a toothache) UNUSUAL RASH, SWELLING OR PAIN  UNUSUAL VAGINAL DISCHARGE OR ITCHING   Items with * indicate a potential emergency and should be followed up as soon as possible or go to the Emergency Department if any problems should occur.  Please show the CHEMOTHERAPY ALERT CARD or  IMMUNOTHERAPY ALERT CARD at check-in to the Emergency Department and triage nurse.  Should you have questions after your visit or need to cancel or reschedule your appointment, please contact [facility] [phone]  and follow the prompts.  Office hours are 8:00 a.m. to 4:30 p.m. Monday - Friday. Please note that voicemails left after 4:00 p.m. may not be returned until the following business day.  We are closed weekends and major holidays. You have access to a nurse at all times for urgent questions. Please call the main number to the clinic [phone] and follow the prompts.  For any non-urgent questions, you may also contact your provider using MyChart. We now offer e-Visits for anyone 47 and older to request care online for non-urgent symptoms. For details visit [email or web address].   Also download the MyChart app! Go to the app store, search "MyChart", open the app, select [facility], and log in with your MyChart username and password.  Masks are optional in the cancer centers. If you would like for your care team to wear a mask while they are taking care of you, please let them know. You may have one support person who is at least 65 years old accompany you for your appointments.

## 2022-10-03 NOTE — Progress Notes (Signed)
Patients port flushed without difficulty.  Good blood return noted with no bruising or swelling noted at site.  Patient remains accessed for chemotherapy treatment.  

## 2022-10-03 NOTE — Progress Notes (Signed)
The following biosimilar Vegzelma (bevacizumab-adcd)  has been selected for use in this patient with his new insurance as Medicare part B.  Henreitta Leber, PharmD

## 2022-10-04 ENCOUNTER — Other Ambulatory Visit: Payer: Self-pay

## 2022-10-04 LAB — CEA: CEA: 22.3 ng/mL — ABNORMAL HIGH (ref 0.0–4.7)

## 2022-10-05 ENCOUNTER — Inpatient Hospital Stay: Payer: Medicare Other

## 2022-10-05 VITALS — BP 137/99 | HR 67 | Temp 97.9°F | Resp 16

## 2022-10-05 DIAGNOSIS — C180 Malignant neoplasm of cecum: Secondary | ICD-10-CM | POA: Diagnosis not present

## 2022-10-05 DIAGNOSIS — Z95828 Presence of other vascular implants and grafts: Secondary | ICD-10-CM

## 2022-10-05 MED ORDER — SODIUM CHLORIDE 0.9% FLUSH
10.0000 mL | INTRAVENOUS | Status: DC | PRN
  Administered 2022-10-05: 10 mL

## 2022-10-05 MED ORDER — HEPARIN SOD (PORK) LOCK FLUSH 100 UNIT/ML IV SOLN
500.0000 [IU] | Freq: Once | INTRAVENOUS | Status: AC | PRN
  Administered 2022-10-05: 500 [IU]

## 2022-10-05 NOTE — Patient Instructions (Signed)
[facility]  Discharge Instructions: Thank you for choosing [facility] to provide your oncology and hematology care.  If you have a lab appointment with the [facility], please come in thru the Main Entrance and check in at the main information desk.  Wear comfortable clothing and clothing appropriate for easy access to any Portacath or PICC line.   We strive to give you quality time with your provider. You may need to reschedule your appointment if you arrive late (15 or more minutes).  Arriving late affects you and other patients whose appointments are after yours.  Also, if you miss three or more appointments without notifying the office, you may be dismissed from the clinic at the provider's discretion.      For prescription refill requests, have your pharmacy contact our office and allow 72 hours for refills to be completed.    Today you received the following chemotherapy and/or immunotherapy agents pump d/c 5FU.       To help prevent nausea and vomiting after your treatment, we encourage you to take your nausea medication as directed.  BELOW ARE SYMPTOMS THAT SHOULD BE REPORTED IMMEDIATELY: *FEVER GREATER THAN 100.4 F (38 C) OR HIGHER *CHILLS OR SWEATING *NAUSEA AND VOMITING THAT IS NOT CONTROLLED WITH YOUR NAUSEA MEDICATION *UNUSUAL SHORTNESS OF BREATH *UNUSUAL BRUISING OR BLEEDING *URINARY PROBLEMS (pain or burning when urinating, or frequent urination) *BOWEL PROBLEMS (unusual diarrhea, constipation, pain near the anus) TENDERNESS IN MOUTH AND THROAT WITH OR WITHOUT PRESENCE OF ULCERS (sore throat, sores in mouth, or a toothache) UNUSUAL RASH, SWELLING OR PAIN  UNUSUAL VAGINAL DISCHARGE OR ITCHING   Items with * indicate a potential emergency and should be followed up as soon as possible or go to the Emergency Department if any problems should occur.  Please show the CHEMOTHERAPY ALERT CARD or IMMUNOTHERAPY ALERT CARD at check-in to the  Emergency Department and triage nurse.  Should you have questions after your visit or need to cancel or reschedule your appointment, please contact [facility] [phone]  and follow the prompts.  Office hours are 8:00 a.m. to 4:30 p.m. Monday - Friday. Please note that voicemails left after 4:00 p.m. may not be returned until the following business day.  We are closed weekends and major holidays. You have access to a nurse at all times for urgent questions. Please call the main number to the clinic [phone] and follow the prompts.  For any non-urgent questions, you may also contact your provider using MyChart. We now offer e-Visits for anyone 8 and older to request care online for non-urgent symptoms. For details visit [email or web address].   Also download the MyChart app! Go to the app store, search "MyChart", open the app, select [facility], and log in with your MyChart username and password.  Masks are optional in the cancer centers. If you would like for your care team to wear a mask while they are taking care of you, please let them know. You may have one support person who is at least 65 years old accompany you for your appointments.

## 2022-10-05 NOTE — Progress Notes (Signed)
Patient presents today for pump d/c. Vital signs are stable. Port a cath site clean, dry, and intact. Port flushed with 10 mls of Normal Saline and 500 Units of Heparin. Needle removed intact. Band aid applied. Patient has no complaints at this time. Discharged from clinic ambulatory and in stable condition. Patient alert and oriented.  

## 2022-10-14 ENCOUNTER — Other Ambulatory Visit: Payer: Self-pay

## 2022-10-17 ENCOUNTER — Ambulatory Visit: Payer: BC Managed Care – PPO | Admitting: Hematology

## 2022-10-17 ENCOUNTER — Inpatient Hospital Stay: Payer: Medicare Other

## 2022-10-17 ENCOUNTER — Inpatient Hospital Stay: Payer: Medicare Other | Attending: Hematology

## 2022-10-17 VITALS — BP 140/93 | HR 63 | Temp 96.1°F | Resp 18

## 2022-10-17 DIAGNOSIS — C7801 Secondary malignant neoplasm of right lung: Secondary | ICD-10-CM | POA: Diagnosis not present

## 2022-10-17 DIAGNOSIS — C180 Malignant neoplasm of cecum: Secondary | ICD-10-CM | POA: Insufficient documentation

## 2022-10-17 DIAGNOSIS — I10 Essential (primary) hypertension: Secondary | ICD-10-CM | POA: Insufficient documentation

## 2022-10-17 DIAGNOSIS — Z87891 Personal history of nicotine dependence: Secondary | ICD-10-CM | POA: Insufficient documentation

## 2022-10-17 DIAGNOSIS — Z5112 Encounter for antineoplastic immunotherapy: Secondary | ICD-10-CM | POA: Diagnosis present

## 2022-10-17 DIAGNOSIS — C7802 Secondary malignant neoplasm of left lung: Secondary | ICD-10-CM | POA: Diagnosis not present

## 2022-10-17 DIAGNOSIS — C7972 Secondary malignant neoplasm of left adrenal gland: Secondary | ICD-10-CM | POA: Insufficient documentation

## 2022-10-17 DIAGNOSIS — Z95828 Presence of other vascular implants and grafts: Secondary | ICD-10-CM

## 2022-10-17 DIAGNOSIS — Z5111 Encounter for antineoplastic chemotherapy: Secondary | ICD-10-CM | POA: Diagnosis present

## 2022-10-17 DIAGNOSIS — Z79899 Other long term (current) drug therapy: Secondary | ICD-10-CM | POA: Insufficient documentation

## 2022-10-17 LAB — CBC WITH DIFFERENTIAL/PLATELET
Abs Immature Granulocytes: 0.02 10*3/uL (ref 0.00–0.07)
Basophils Absolute: 0.1 10*3/uL (ref 0.0–0.1)
Basophils Relative: 2 %
Eosinophils Absolute: 0.1 10*3/uL (ref 0.0–0.5)
Eosinophils Relative: 3 %
HCT: 35.7 % — ABNORMAL LOW (ref 39.0–52.0)
Hemoglobin: 11.6 g/dL — ABNORMAL LOW (ref 13.0–17.0)
Immature Granulocytes: 0 %
Lymphocytes Relative: 51 %
Lymphs Abs: 2.3 10*3/uL (ref 0.7–4.0)
MCH: 31.7 pg (ref 26.0–34.0)
MCHC: 32.5 g/dL (ref 30.0–36.0)
MCV: 97.5 fL (ref 80.0–100.0)
Monocytes Absolute: 0.6 10*3/uL (ref 0.1–1.0)
Monocytes Relative: 13 %
Neutro Abs: 1.4 10*3/uL — ABNORMAL LOW (ref 1.7–7.7)
Neutrophils Relative %: 31 %
Platelets: 166 10*3/uL (ref 150–400)
RBC: 3.66 MIL/uL — ABNORMAL LOW (ref 4.22–5.81)
RDW: 15.5 % (ref 11.5–15.5)
WBC: 4.5 10*3/uL (ref 4.0–10.5)
nRBC: 0 % (ref 0.0–0.2)

## 2022-10-17 LAB — COMPREHENSIVE METABOLIC PANEL
ALT: 8 U/L (ref 0–44)
AST: 18 U/L (ref 15–41)
Albumin: 3.9 g/dL (ref 3.5–5.0)
Alkaline Phosphatase: 46 U/L (ref 38–126)
Anion gap: 9 (ref 5–15)
BUN: 10 mg/dL (ref 8–23)
CO2: 26 mmol/L (ref 22–32)
Calcium: 9.2 mg/dL (ref 8.9–10.3)
Chloride: 103 mmol/L (ref 98–111)
Creatinine, Ser: 0.92 mg/dL (ref 0.61–1.24)
GFR, Estimated: >60 mL/min (ref >60)
Glucose, Bld: 116 mg/dL — ABNORMAL HIGH (ref 70–99)
Potassium: 3.7 mmol/L (ref 3.5–5.1)
Sodium: 138 mmol/L (ref 135–145)
Total Bilirubin: 1.1 mg/dL (ref 0.3–1.2)
Total Protein: 7.1 g/dL (ref 6.5–8.1)

## 2022-10-17 MED ORDER — FLUOROURACIL CHEMO INJECTION 2.5 GM/50ML
400.0000 mg/m2 | Freq: Once | INTRAVENOUS | Status: AC
  Administered 2022-10-17: 800 mg via INTRAVENOUS
  Filled 2022-10-17: qty 16

## 2022-10-17 MED ORDER — HEPARIN SOD (PORK) LOCK FLUSH 100 UNIT/ML IV SOLN: 500.0000 [IU] | Freq: Once | INTRAVENOUS | Status: DC | PRN

## 2022-10-17 MED ORDER — SODIUM CHLORIDE 0.9 % IV SOLN
5.0000 mg/kg | Freq: Once | INTRAVENOUS | Status: AC
  Administered 2022-10-17: 400 mg via INTRAVENOUS
  Filled 2022-10-17: qty 16

## 2022-10-17 MED ORDER — SODIUM CHLORIDE 0.9 % IV SOLN: Freq: Once | INTRAVENOUS | Status: AC

## 2022-10-17 MED ORDER — SODIUM CHLORIDE 0.9 % IV SOLN
10.0000 mg | Freq: Once | INTRAVENOUS | Status: AC
  Administered 2022-10-17: 10 mg via INTRAVENOUS
  Filled 2022-10-17: qty 10

## 2022-10-17 MED ORDER — SODIUM CHLORIDE 0.9% FLUSH: 10.0000 mL | INTRAVENOUS | Status: DC | PRN

## 2022-10-17 MED ORDER — PALONOSETRON HCL INJECTION 0.25 MG/5ML
0.2500 mg | Freq: Once | INTRAVENOUS | Status: AC
  Administered 2022-10-17: 0.25 mg via INTRAVENOUS
  Filled 2022-10-17: qty 5

## 2022-10-17 MED ORDER — SODIUM CHLORIDE 0.9 % IV SOLN
400.0000 mg/m2 | Freq: Once | INTRAVENOUS | Status: AC
  Administered 2022-10-17: 780 mg via INTRAVENOUS
  Filled 2022-10-17: qty 39

## 2022-10-17 MED ORDER — SODIUM CHLORIDE 0.9 % IV SOLN
5000.0000 mg | INTRAVENOUS | Status: DC
  Administered 2022-10-17: 5000 mg via INTRAVENOUS
  Filled 2022-10-17: qty 100

## 2022-10-17 NOTE — Progress Notes (Signed)
Patient presents today for Avastin/Leucovorin/Fluorouracil with 5FU pump start.  Vital signs within parameters for treatment.  Labs pending.  Patient has no new complaints at this time.  ANC noted to be 1.4.  MD notified.  Message received patient okay for treatment.    Treatment given today per MD orders.  Tolerated infusion without adverse affects.  Pump connected  and verified RUN on the screen with the patient.  Vital signs stable.  No complaints at this time.  Discharge from clinic ambulatory in stable condition.  Alert and oriented X 3.  Follow up with [facility] as scheduled.

## 2022-10-17 NOTE — Patient Instructions (Signed)
[facility]  Discharge Instructions: Thank you for choosing [facility] to provide your oncology and hematology care.  If you have a lab appointment with the [facility], please come in thru the Main Entrance and check in at the main information desk.  Wear comfortable clothing and clothing appropriate for easy access to any Portacath or PICC line.   We strive to give you quality time with your provider. You may need to reschedule your appointment if you arrive late (15 or more minutes).  Arriving late affects you and other patients whose appointments are after yours.  Also, if you miss three or more appointments without notifying the office, you may be dismissed from the clinic at the provider's discretion.      For prescription refill requests, have your pharmacy contact our office and allow 72 hours for refills to be completed.    Today you received the following chemotherapy and/or immunotherapy agents bevacizumab, Leucovorin, Fluorouracil, 5FU      To help prevent nausea and vomiting after your treatment, we encourage you to take your nausea medication as directed.  BELOW ARE SYMPTOMS THAT SHOULD BE REPORTED IMMEDIATELY: *FEVER GREATER THAN 100.4 F (38 C) OR HIGHER *CHILLS OR SWEATING *NAUSEA AND VOMITING THAT IS NOT CONTROLLED WITH YOUR NAUSEA MEDICATION *UNUSUAL SHORTNESS OF BREATH *UNUSUAL BRUISING OR BLEEDING *URINARY PROBLEMS (pain or burning when urinating, or frequent urination) *BOWEL PROBLEMS (unusual diarrhea, constipation, pain near the anus) TENDERNESS IN MOUTH AND THROAT WITH OR WITHOUT PRESENCE OF ULCERS (sore throat, sores in mouth, or a toothache) UNUSUAL RASH, SWELLING OR PAIN  UNUSUAL VAGINAL DISCHARGE OR ITCHING   Items with * indicate a potential emergency and should be followed up as soon as possible or go to the Emergency Department if any problems should occur.  Please show the CHEMOTHERAPY ALERT CARD or IMMUNOTHERAPY  ALERT CARD at check-in to the Emergency Department and triage nurse.  Should you have questions after your visit or need to cancel or reschedule your appointment, please contact [facility] [phone]  and follow the prompts.  Office hours are 8:00 a.m. to 4:30 p.m. Monday - Friday. Please note that voicemails left after 4:00 p.m. may not be returned until the following business day.  We are closed weekends and major holidays. You have access to a nurse at all times for urgent questions. Please call the main number to the clinic [phone] and follow the prompts.  For any non-urgent questions, you may also contact your provider using MyChart. We now offer e-Visits for anyone 17 and older to request care online for non-urgent symptoms. For details visit [email or web address].   Also download the MyChart app! Go to the app store, search "MyChart", open the app, select [facility], and log in with your MyChart username and password.  Masks are optional in the cancer centers. If you would like for your care team to wear a mask while they are taking care of you, please let them know. You may have one support person who is at least 65 years old accompany you for your appointments.

## 2022-10-19 ENCOUNTER — Inpatient Hospital Stay: Payer: Medicare Other

## 2022-10-19 VITALS — BP 148/90 | HR 81 | Temp 97.0°F | Resp 18

## 2022-10-19 DIAGNOSIS — C180 Malignant neoplasm of cecum: Secondary | ICD-10-CM | POA: Diagnosis not present

## 2022-10-19 DIAGNOSIS — Z95828 Presence of other vascular implants and grafts: Secondary | ICD-10-CM

## 2022-10-19 MED ORDER — HEPARIN SOD (PORK) LOCK FLUSH 100 UNIT/ML IV SOLN
500.0000 [IU] | Freq: Once | INTRAVENOUS | Status: AC | PRN
  Administered 2022-10-19: 500 [IU]

## 2022-10-19 MED ORDER — SODIUM CHLORIDE 0.9% FLUSH
10.0000 mL | INTRAVENOUS | Status: DC | PRN
  Administered 2022-10-19: 10 mL

## 2022-10-19 NOTE — Patient Instructions (Signed)
[facility]  Discharge Instructions: Thank you for choosing [facility] to provide your oncology and hematology care.  If you have a lab appointment with the Cancer Center, please come in thru the Main Entrance and check in at the main information desk.  Wear comfortable clothing and clothing appropriate for easy access to any Portacath or PICC line.   We strive to give you quality time with your provider. You may need to reschedule your appointment if you arrive late (15 or more minutes).  Arriving late affects you and other patients whose appointments are after yours.  Also, if you miss three or more appointments without notifying the office, you may be dismissed from the clinic at the provider's discretion.      For prescription refill requests, have your pharmacy contact our office and allow 72 hours for refills to be completed.     To help prevent nausea and vomiting after your treatment, we encourage you to take your nausea medication as directed.  BELOW ARE SYMPTOMS THAT SHOULD BE REPORTED IMMEDIATELY: *FEVER GREATER THAN 100.4 F (38 C) OR HIGHER *CHILLS OR SWEATING *NAUSEA AND VOMITING THAT IS NOT CONTROLLED WITH YOUR NAUSEA MEDICATION *UNUSUAL SHORTNESS OF BREATH *UNUSUAL BRUISING OR BLEEDING *URINARY PROBLEMS (pain or burning when urinating, or frequent urination) *BOWEL PROBLEMS (unusual diarrhea, constipation, pain near the anus) TENDERNESS IN MOUTH AND THROAT WITH OR WITHOUT PRESENCE OF ULCERS (sore throat, sores in mouth, or a toothache) UNUSUAL RASH, SWELLING OR PAIN  UNUSUAL VAGINAL DISCHARGE OR ITCHING   Items with * indicate a potential emergency and should be followed up as soon as possible or go to the Emergency Department if any problems should occur.  Please show the CHEMOTHERAPY ALERT CARD or IMMUNOTHERAPY ALERT CARD at check-in to the Emergency Department and triage nurse.  Should you have questions after your visit or need to  cancel or reschedule your appointment, please contact [facility] [phone]  and follow the prompts.  Office hours are 8:00 a.m. to 4:30 p.m. Monday - Friday. Please note that voicemails left after 4:00 p.m. may not be returned until the following business day.  We are closed weekends and major holidays. You have access to a nurse at all times for urgent questions. Please call the main number to the clinic [phone] and follow the prompts.  For any non-urgent questions, you may also contact your provider using MyChart. We now offer e-Visits for anyone 18 and older to request care online for non-urgent symptoms. For details visit [email or web address].   Also download the MyChart app! Go to the app store, search "MyChart", open the app, select [facility], and log in with your MyChart username and password.  Masks are optional in the cancer centers. If you would like for your care team to wear a mask while they are taking care of you, please let them know. You may have one support person who is at least 65 years old accompany you for your appointments.  

## 2022-10-19 NOTE — Progress Notes (Signed)
Patients port flushed without difficulty.  Good blood return noted with no bruising or swelling noted at site.  Home infusion 5FU pump disconnected.  Band aid applied.  VSS with discharge and left in satisfactory condition with no s/s of distress noted.   °

## 2022-10-31 ENCOUNTER — Other Ambulatory Visit: Payer: Self-pay

## 2022-10-31 ENCOUNTER — Ambulatory Visit: Payer: BC Managed Care – PPO | Admitting: Hematology

## 2022-10-31 ENCOUNTER — Ambulatory Visit: Payer: BC Managed Care – PPO

## 2022-10-31 ENCOUNTER — Other Ambulatory Visit: Payer: BC Managed Care – PPO

## 2022-10-31 MED ORDER — OXYCODONE HCL 10 MG PO TABS: 10.0000 mg | ORAL_TABLET | Freq: Four times a day (QID) | ORAL | 0 refills | Status: DC | PRN

## 2022-11-01 ENCOUNTER — Ambulatory Visit (HOSPITAL_COMMUNITY)
Admission: RE | Admit: 2022-11-01 | Discharge: 2022-11-01 | Disposition: A | Discharge status: Home / Self Care | Payer: Medicare Other | Source: Ambulatory Visit | Attending: Hematology | Admitting: Hematology

## 2022-11-01 DIAGNOSIS — C180 Malignant neoplasm of cecum: Secondary | ICD-10-CM | POA: Diagnosis present

## 2022-11-01 MED ORDER — IOHEXOL 300 MG/ML  SOLN
100.0000 mL | Freq: Once | INTRAMUSCULAR | Status: AC | PRN
  Administered 2022-11-01: 100 mL via INTRAVENOUS

## 2022-11-07 ENCOUNTER — Other Ambulatory Visit: Payer: Self-pay

## 2022-11-07 ENCOUNTER — Inpatient Hospital Stay: Payer: Medicare Other

## 2022-11-07 ENCOUNTER — Inpatient Hospital Stay (HOSPITAL_BASED_OUTPATIENT_CLINIC_OR_DEPARTMENT_OTHER): Payer: Medicare Other | Admitting: Hematology

## 2022-11-07 VITALS — BP 145/89 | HR 62 | Temp 97.2°F | Resp 18

## 2022-11-07 DIAGNOSIS — K5900 Constipation, unspecified: Secondary | ICD-10-CM

## 2022-11-07 DIAGNOSIS — C189 Malignant neoplasm of colon, unspecified: Secondary | ICD-10-CM

## 2022-11-07 DIAGNOSIS — C180 Malignant neoplasm of cecum: Secondary | ICD-10-CM

## 2022-11-07 DIAGNOSIS — Z95828 Presence of other vascular implants and grafts: Secondary | ICD-10-CM

## 2022-11-07 LAB — CBC WITH DIFFERENTIAL/PLATELET
Abs Immature Granulocytes: 0.01 10*3/uL (ref 0.00–0.07)
Basophils Absolute: 0 10*3/uL (ref 0.0–0.1)
Basophils Relative: 1 %
Eosinophils Absolute: 0.1 10*3/uL (ref 0.0–0.5)
Eosinophils Relative: 3 %
HCT: 34.2 % — ABNORMAL LOW (ref 39.0–52.0)
Hemoglobin: 11.3 g/dL — ABNORMAL LOW (ref 13.0–17.0)
Immature Granulocytes: 0 %
Lymphocytes Relative: 49 %
Lymphs Abs: 1.9 10*3/uL (ref 0.7–4.0)
MCH: 31.5 pg (ref 26.0–34.0)
MCHC: 33 g/dL (ref 30.0–36.0)
MCV: 95.3 fL (ref 80.0–100.0)
Monocytes Absolute: 0.5 10*3/uL (ref 0.1–1.0)
Monocytes Relative: 14 %
Neutro Abs: 1.3 10*3/uL — ABNORMAL LOW (ref 1.7–7.7)
Neutrophils Relative %: 33 %
Platelets: 150 10*3/uL (ref 150–400)
RBC: 3.59 MIL/uL — ABNORMAL LOW (ref 4.22–5.81)
RDW: 14.5 % (ref 11.5–15.5)
WBC: 3.9 10*3/uL — ABNORMAL LOW (ref 4.0–10.5)
nRBC: 0 % (ref 0.0–0.2)

## 2022-11-07 LAB — COMPREHENSIVE METABOLIC PANEL
ALT: 9 U/L (ref 0–44)
AST: 19 U/L (ref 15–41)
Albumin: 3.9 g/dL (ref 3.5–5.0)
Alkaline Phosphatase: 42 U/L (ref 38–126)
Anion gap: 6 (ref 5–15)
BUN: 16 mg/dL (ref 8–23)
CO2: 26 mmol/L (ref 22–32)
Calcium: 8.9 mg/dL (ref 8.9–10.3)
Chloride: 103 mmol/L (ref 98–111)
Creatinine, Ser: 0.99 mg/dL (ref 0.61–1.24)
GFR, Estimated: >60 mL/min (ref >60)
Glucose, Bld: 102 mg/dL — ABNORMAL HIGH (ref 70–99)
Potassium: 3.8 mmol/L (ref 3.5–5.1)
Sodium: 135 mmol/L (ref 135–145)
Total Bilirubin: 0.8 mg/dL (ref 0.3–1.2)
Total Protein: 7.2 g/dL (ref 6.5–8.1)

## 2022-11-07 LAB — URINALYSIS, DIPSTICK ONLY
Bilirubin Urine: NEGATIVE
Glucose, UA: NEGATIVE mg/dL
Hgb urine dipstick: NEGATIVE
Ketones, ur: NEGATIVE mg/dL
Leukocytes,Ua: NEGATIVE
Nitrite: NEGATIVE
Protein, ur: NEGATIVE mg/dL
Specific Gravity, Urine: 1.018 (ref 1.005–1.030)
pH: 5 (ref 5.0–8.0)

## 2022-11-07 LAB — MAGNESIUM: Magnesium: 2 mg/dL (ref 1.7–2.4)

## 2022-11-07 MED ORDER — SODIUM CHLORIDE 0.9 % IV SOLN: Freq: Once | INTRAVENOUS | Status: AC

## 2022-11-07 MED ORDER — FLUOROURACIL CHEMO INJECTION 2.5 GM/50ML
400.0000 mg/m2 | Freq: Once | INTRAVENOUS | Status: AC
  Administered 2022-11-07: 800 mg via INTRAVENOUS
  Filled 2022-11-07: qty 16

## 2022-11-07 MED ORDER — HEPARIN SOD (PORK) LOCK FLUSH 100 UNIT/ML IV SOLN: 500.0000 [IU] | Freq: Once | INTRAVENOUS | Status: DC | PRN

## 2022-11-07 MED ORDER — SODIUM CHLORIDE 0.9 % IV SOLN
5.0000 mg/kg | Freq: Once | INTRAVENOUS | Status: AC
  Administered 2022-11-07: 400 mg via INTRAVENOUS
  Filled 2022-11-07: qty 16

## 2022-11-07 MED ORDER — SODIUM CHLORIDE 0.9% FLUSH
10.0000 mL | INTRAVENOUS | Status: DC | PRN
  Administered 2022-11-07: 10 mL via INTRAVENOUS

## 2022-11-07 MED ORDER — PALONOSETRON HCL INJECTION 0.25 MG/5ML
0.2500 mg | Freq: Once | INTRAVENOUS | Status: AC
  Administered 2022-11-07: 0.25 mg via INTRAVENOUS
  Filled 2022-11-07: qty 5

## 2022-11-07 MED ORDER — LACTULOSE 20 GM/30ML PO SOLN: 10.0000 g | Freq: Every day | ORAL | 1 refills | Status: DC

## 2022-11-07 MED ORDER — SODIUM CHLORIDE 0.9 % IV SOLN
10.0000 mg | Freq: Once | INTRAVENOUS | Status: AC
  Administered 2022-11-07: 10 mg via INTRAVENOUS
  Filled 2022-11-07: qty 10

## 2022-11-07 MED ORDER — SODIUM CHLORIDE 0.9 % IV SOLN
2575.0000 mg/m2 | INTRAVENOUS | Status: DC
  Administered 2022-11-07: 5000 mg via INTRAVENOUS
  Filled 2022-11-07: qty 100

## 2022-11-07 MED ORDER — SODIUM CHLORIDE 0.9% FLUSH: 10.0000 mL | INTRAVENOUS | Status: DC | PRN

## 2022-11-07 MED ORDER — SODIUM CHLORIDE 0.9 % IV SOLN
400.0000 mg/m2 | Freq: Once | INTRAVENOUS | Status: AC
  Administered 2022-11-07: 780 mg via INTRAVENOUS
  Filled 2022-11-07: qty 39

## 2022-11-07 NOTE — Progress Notes (Signed)
Chaplain engaged in a follow-up visit with Evan [name].  He is doing much better than when Chaplain last encountered him.  He talked about his hair growing back, being able to gain weight back, continuing to work and provide, and having his youngest daughter home with him for the holidays.  Chaplain affirmed all the good things happening for Evan [name] and seeing the difference in his countenance.    Chaplain continues to offer community, listening and support.  Chaplain offered a blessing over him.     11/07/22 1100  Clinical Encounter Type  Visited With Patient  Visit Type Follow-up

## 2022-11-07 NOTE — Patient Instructions (Addendum)
[facility]  Discharge Instructions  You were seen and examined today by Dr. Delton Coombes.  Proceed with treatment today as planned. Your CT scan was stable.  Follow-up as scheduled.  Thank you for choosing [facility] to provide your oncology and hematology care.   To afford each patient quality time with our provider, please arrive at least 15 minutes before your scheduled appointment time. You may need to reschedule your appointment if you arrive late (10 or more minutes). Arriving late affects you and other patients whose appointments are after yours.  Also, if you miss three or more appointments without notifying the office, you may be dismissed from the clinic at the provider's discretion.    Again, thank you for choosing [facility].  Our hope is that these requests will decrease the amount of time that you wait before being seen by our physicians.   If you have a lab appointment with the [facility] please come in thru the Main Entrance and check in at the main information desk.           _____________________________________________________________  Should you have questions after your visit to [facility], please contact our office at [phone] and follow the prompts.  Our office hours are 8:00 a.m. to 4:30 p.m. Monday - Thursday and 8:00 a.m. to 2:30 p.m. Friday.  Please note that voicemails left after 4:00 p.m. may not be returned until the following business day.  We are closed weekends and all major holidays.  You do have access to a nurse 24-7, just call the main number to the clinic [phone] and do not press any options, hold on the line and a nurse will answer the phone.    For prescription refill requests, have your pharmacy contact our office and allow 72 hours.    Masks are optional in the cancer centers. If you would like for your care team to wear a mask while they are taking care  of you, please let them know. You may have one support person who is at least 65 years old accompany you for your appointments.

## 2022-11-07 NOTE — Progress Notes (Signed)
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  HER2 negative.  TMB low.  MSI-stable.  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  Other targetable mutations negative.  CURRENT THERAPY: K-ras G12 D mutation.  HER2 negative.  TMB low.  MSI-stable.  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  Other targetable mutations negative.  BRIEF ONCOLOGIC HISTORY:  Oncology History  Cecal cancer ([facility])  10/07/2019 Initial Diagnosis   Cecal cancer ([facility])   10/07/2019 Cancer Staging   Staging form: Colon and Rectum, AJCC 8th Edition - Clinical stage from 10/07/2019: Stage IIIB (cT3, cN1, cM0) - Signed by Derek Jack, MD on 10/07/2019   02/22/2022 - 08/08/2022 Chemotherapy   Patient is on Treatment Plan : COLORECTAL FOLFIRI / BEVACIZUMAB Q14D     02/22/2022 -  Chemotherapy   Patient is on Treatment Plan : COLORECTAL FOLFIRI + Bevacizumab q14d       CANCER STAGING:  Cancer Staging  Cecal cancer ([facility]) Staging form: Colon and Rectum, AJCC 8th Edition - Clinical stage from 10/07/2019: Stage IIIB (cT3, cN1, cM0) - Signed by Derek Jack, MD on 10/07/2019 - Pathologic stage from 01/23/2022: Stage IVB (rpTX, pN0, pM1b) - Unsigned   INTERVAL HISTORY:  Mr. PETER KEYWORTH, a 66 y.o. male, seen for follow-up of metastatic colon cancer and toxicity assessment prior to maintenance regimen.  Pain is well-controlled with oxycodone every 6 hours.  Denies any major GI symptoms.  He is continuing to work full-time.  He reports energy level is 80%.  He is eating well.  REVIEW OF SYSTEMS:  Review of Systems  Constitutional:  Negative for appetite change and unexpected weight change.  Cardiovascular:  Positive for chest  pain (Sternal pain).  Neurological:  Negative for headaches.  All other systems reviewed and are negative.   PAST MEDICAL/SURGICAL HISTORY:  Past Medical History:  Diagnosis Date   Arthritis    Colon cancer ([facility])    colon   Hepatitis C    Hypertension    Port-A-Cath in place 02/15/2022   Past Surgical History:  Procedure Laterality Date   COLON SURGERY     PORTACATH PLACEMENT Right 02/08/2022   Procedure: INSERTION PORT-A-CATH- RIJ;  Surgeon: Rusty Aus, DO;  Location: [facility];  Service: General;  Laterality: Right;   REPLACEMENT TOTAL KNEE Left    SHOULDER ARTHROSCOPY Bilateral    TOTAL HIP ARTHROPLASTY Right 11/09/2020   Procedure: TOTAL HIP ARTHROPLASTY ANTERIOR APPROACH;  Surgeon: Renette Butters, MD;  Location: [facility];  Service: Orthopedics;  Laterality: Right;   WRIST SURGERY Left     SOCIAL HISTORY:  Social History   Socioeconomic History   Marital status: Married    Spouse name: Not on file   Number of children: 7   Years of education: Not on file   Highest education level: Not on file  Occupational History   Occupation: employed  Tobacco Use   Smoking status: Former    Packs/day: 1.50    Years: 20.00    Total pack years: 30.00    Types: Cigarettes    Quit date: 11/19/2005    Years since quitting: 16.9   Smokeless tobacco: Never  Vaping Use  Vaping Use: Never used  Substance and Sexual Activity   Alcohol use: Not Currently   Drug use: Never   Sexual activity: Yes  Other Topics Concern   Not on file  Social History Narrative   ** Merged History Encounter **       Separated from wife 11/2021   Social Determinants of Health   Financial Resource Strain: Low Risk  (10/07/2019)   Overall Financial Resource Strain (CARDIA)    Difficulty of Paying Living Expenses: Not very hard  Food Insecurity: No Food Insecurity (10/07/2019)   Hunger Vital Sign    Worried About Running Out of Food in the Last Year: Never true    Ran Out of Food in the  Last Year: Never true  Transportation Needs: No Transportation Needs (10/07/2019)   PRAPARE - Hydrologist (Medical): No    Lack of Transportation (Non-Medical): No  Physical Activity: Inactive (10/07/2019)   Exercise Vital Sign    Days of Exercise per Week: 0 days    Minutes of Exercise per Session: 0 min  Stress: No Stress Concern Present (10/07/2019)   [place]    Feeling of Stress : Not at all  Social Connections: Moderately Integrated (10/07/2019)   Social Connection and Isolation Panel [NHANES]    Frequency of Communication with Friends and Family: Once a week    Frequency of Social Gatherings with Friends and Family: Once a week    Attends Religious Services: More than 4 times per year    Active Member of [employer] or Organizations: Yes    Attends Music therapist: More than 4 times per year    Marital Status: Married  Human resources officer Violence: Not At Risk (10/07/2019)   Humiliation, Afraid, Rape, and Kick questionnaire    Fear of Current or Ex-Partner: No    Emotionally Abused: No    Physically Abused: No    Sexually Abused: No    FAMILY HISTORY:  Family History  Problem Relation Age of Onset   Heart disease Mother    Dementia Father    Heart disease Sister    Cancer Brother    Hypertension Brother    Hypertension Brother    Healthy Son    Healthy Son    Healthy Son    Healthy Son    Healthy Daughter    Healthy Daughter    Healthy Daughter     CURRENT MEDICATIONS:  Current Outpatient Medications  Medication Sig Dispense Refill   aluminum-magnesium hydroxide-simethicone (MAALOX) [id number] MG/5ML SUSP Take 30 mLs by mouth 4 (four) times daily -  before meals and at bedtime. 1680 mL 2   amLODipine (NORVASC) 10 MG tablet Take 1 tablet (10 mg total) by mouth daily. 90 tablet 3   Bevacizumab (AVASTIN IV) Inject into the vein every 14 (fourteen) days. *start  date TBD     ENULOSE 10 GM/15ML SOLN Take by mouth.     fluorouracil CALGB 63875 2,400 mg/m2 in sodium chloride 0.9 % 150 mL Inject 2,400 mg/m2 into the vein over 48 hr.     FLUOROURACIL IV Inject into the vein every 14 (fourteen) days.     LEUCOVORIN CALCIUM IV Inject into the vein every 14 (fourteen) days.     megestrol (MEGACE) 400 MG/10ML suspension Take 10 mLs (400 mg total) by mouth 2 (two) times daily. 480 mL 3   Oxycodone HCl 10 MG TABS Take 1 tablet (10 mg  total) by mouth every 6 (six) hours as needed. 120 tablet 0   potassium chloride SA (KLOR-CON M) 20 MEQ tablet Take 1 tablet (20 mEq total) by mouth 2 (two) times daily. 30 tablet 3   traZODone (DESYREL) 50 MG tablet Take 1 tablet (50 mg total) by mouth at bedtime. 30 tablet 3   vitamin B-12 (CYANOCOBALAMIN) 1000 MCG tablet Take 1 tablet (1,000 mcg total) by mouth daily. 30 tablet 6   Lactulose 20 GM/30ML SOLN Take 15 mLs (10 g total) by mouth at bedtime. Take 15 ml at bedtime every night to assist with regular bowel movements.  Titrate down if having multiple bowel movements.  If a bowel movement has not occurred in 3 to 4 days or longer, then take 15 ml every 3 hours until a bowel movent has occurred. 450 mL 1   No current facility-administered medications for this visit.   Facility-Administered Medications Ordered in Other Visits  Medication Dose Route Frequency Provider Last Rate Last Admin   fluorouracil (ADRUCIL) 5,000 mg in sodium chloride 0.9 % 150 mL chemo infusion  2,575 mg/m2 (Treatment Plan Recorded) Intravenous 1 day or 1 dose Derek Jack, MD   Infusion Verify at 11/07/22 1422   heparin lock flush 100 unit/mL  500 Units Intracatheter Once PRN Derek Jack, MD       sodium chloride flush (NS) 0.9 % injection 10 mL  10 mL Intracatheter PRN Derek Jack, MD        ALLERGIES:  No Known Allergies  PHYSICAL EXAM:  Performance status (ECOG): 0 - Asymptomatic  There were no vitals filed for this  visit.  Wt Readings from Last 3 Encounters:  11/07/22 184 lb 14.4 oz (83.9 kg)  10/17/22 183 lb (83 kg)  10/03/22 182 lb 1.6 oz (82.6 kg)   Physical Exam Vitals reviewed.  Constitutional:      Appearance: Normal appearance.  Cardiovascular:     Rate and Rhythm: Normal rate and regular rhythm.     Pulses: Normal pulses.     Heart sounds: Normal heart sounds.  Pulmonary:     Effort: Pulmonary effort is normal.     Breath sounds: Normal breath sounds.  Neurological:     General: No focal deficit present.     Mental Status: He is alert and oriented to person, place, and time.  Psychiatric:        Mood and Affect: Mood normal.        Behavior: Behavior normal.     LABORATORY DATA:  I have reviewed the labs as listed.     Latest Ref Rng & Units 11/07/2022    9:02 AM 10/17/2022   10:02 AM 10/03/2022    8:59 AM  CBC  WBC 4.0 - 10.5 K/uL 3.9  4.5  5.2   Hemoglobin 13.0 - 17.0 g/dL 11.3  11.6  11.2   Hematocrit 39.0 - 52.0 % 34.2  35.7  34.7   Platelets 150 - 400 K/uL 150  166  121       Latest Ref Rng & Units 11/07/2022    9:02 AM 10/17/2022   10:02 AM 10/03/2022    8:59 AM  CMP  Glucose 70 - 99 mg/dL 102  116  105   BUN 8 - 23 mg/dL _0 Creatinine 0.61 - 1.24 mg/dL 0.99  0.92  0.90   Sodium 135 - 145 mmol/L 135  138  140   Potassium 3.5 - 5.1 mmol/L 3.8  3.7  3.3   Chloride 98 - 111 mmol/L 103  103  106   CO2 22 - 32 mmol/L _0 Calcium 8.9 - 10.3 mg/dL 8.9  9.2  8.8   Total Protein 6.5 - 8.1 g/dL 7.2  7.1  7.0   Total Bilirubin 0.3 - 1.2 mg/dL 0.8  1.1  0.8   Alkaline Phos 38 - 126 U/L 42  46  44   AST 15 - 41 U/L _1 ALT 0 - 44 U/L _2 DIAGNOSTIC IMAGING:  I have independently reviewed the scans and discussed with the patient. CT CHEST ABDOMEN PELVIS W CONTRAST  Result Date: 11/03/2022 CLINICAL DATA:  History of metastatic colon cancer. Restaging exam. * Tracking Code: BO * EXAM: CT CHEST, ABDOMEN, AND PELVIS WITH CONTRAST  TECHNIQUE: Multidetector CT imaging of the chest, abdomen and pelvis was performed following the standard protocol during bolus administration of intravenous contrast. RADIATION DOSE REDUCTION: This exam was performed according to the departmental dose-optimization program which includes automated exposure control, adjustment of the mA and/or kV according to patient size and/or use of iterative reconstruction technique. CONTRAST:  157m OMNIPAQUE IOHEXOL 300 MG/ML  SOLN COMPARISON:  CT C [facility] August 07, 2022 FINDINGS: CT CHEST FINDINGS Cardiovascular: Anterior chest wall Port-A-Cath is present with tip terminating in the superior vena cava. Normal heart size. Trace fluid superior pericardial recess. Thoracic aortic vascular calcifications. Mediastinum/Nodes: Similar-appearing mediastinal adenopathy including a 1.9 cm precarinal node (image 27; series 2), a 1.5 cm subcarinal node (image 33; series 2) and a 1.0 cm prevascular node (image 30; series 2). Normal appearance of the esophagus. Lungs/Pleura: Central airways are patent. Dependent atelectasis within the left-greater-than-right lower lobes. No large area pulmonary consolidation. No pleural effusion or pneumothorax. Redemonstrated multiple bilateral pulmonary metastasis, many are stable in size, a few have slightly decreased in size. Index right upper lobe lesion measures 3.1 x 1.9 cm (image 57; series 3), previously 3.2 x 1.9 cm. Index left upper lobe lesion measures 2.7 x 1.5 cm (image 58; series 3), previously 2.8 x 1.8 cm. Index left perihilar mass measures 6.5 x 2.8 cm (image 83; series 3), previously 6.6 x 3.2 cm. Index right paravertebral nodule measures 2.3 cm (image 113; series 3), previously 2.5 cm. Index right lower lobe bilobed nodule measures 2.4 x 1.3 cm (image 98; series 3), previously 2.5 x 1.4 cm. Musculoskeletal: No aggressive or acute appearing osseous lesions. CT ABDOMEN PELVIS FINDINGS Hepatobiliary: The liver is normal in size and contour. No  focal lesion identified. Gallbladder is unremarkable. No intrahepatic or extrahepatic biliary ductal dilatation. Pancreas: Unremarkable Spleen: Unremarkable Adrenals/Urinary Tract: Similar-appearing 1.3 cm left adrenal metastasis (image 61; series 2), previously 1.3 cm when measured in the same plane. Normal right adrenal gland. Kidneys enhance symmetrically with contrast. No hydronephrosis. Urinary bladder is poorly visualized due to streak artifact. Stomach/Bowel: Oral contrast material within the small and large bowel. No evidence for bowel obstruction. No free fluid or free intraperitoneal air. Normal morphology of the stomach. Stable postsurgical changes compatible with right hemicolectomy. Vascular/Lymphatic: Normal caliber abdominal aorta. No retroperitoneal lymphadenopathy. Reproductive: Unremarkable. Other: None. Musculoskeletal: Right hip arthroplasty. Left hip degenerative changes. Lumbar spine degenerative changes. IMPRESSION: 1. Multiple bilateral pulmonary metastasis, many of which are stable in size, a few have slightly decreased in size when compared to prior exam. 2. Similar-appearing mediastinal adenopathy. 3. Similar-appearing left adrenal metastasis. 4. No  new or progressive disease in the chest, abdomen or pelvis. 5. Aortic atherosclerosis. Electronically Signed   By: [name] M.D.   On: 11/03/2022 12:42     ASSESSMENT:  Left lung mass with multiple lung nodules: - Presentation with dry cough for 6 months. - 30 pound weight loss in the last couple of years, weight stable over the last 6 months. - CT chest with contrast on 12/16/2021 showed bulky left hilar mass measuring 8.1 x 7.5 cm.  Numerous bilateral lung nodules of varying sizes.  Left adrenal nodule measuring 2.8 x 2.2 cm.  Mediastinal and bilateral hilar adenopathy with the largest pretracheal node measuring 4.1 x 3.1 cm. - MRI of the brain from 01/04/2022 which was negative for metastatic disease. - CTAP from 01/03/2022 which  showed isolated left adrenal metastasis with no other evidence of metastatic disease in the abdomen or pelvis. - Pathology of left lung biopsy which shows adenocarcinoma with necrosis.  CK20 positive and CDX2 positive but negative for CK7 indicating colonic primary. 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  HER2 negative.  TMB low.  MSI-stable.  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  Other targetable mutations negative. - FOLFIRI started on 02/22/2022, bevacizumab added with cycle 4  - CT CAP (05/17/2022): Mediastinal and hilar lymph nodes have decreased in size.  Largest perihilar left lower lobe lung mass has decreased in size.  Right upper lobe mass slightly increased in size.  Other nodules are stable.  Left adrenal mass decreased to 1.3 cm from 2.8 cm. - CT scan showed mixed response as it was compared to CT from 12/16/2021.  He did not start chemotherapy until 02/22/2022.  Social/family history: - Lives by himself.  He paints houses for living. - He quit smoking 12 years back and started back again 1 and half year ago and smoked half pack per day.  He quit again about 1 week ago. - Father had cancer, type unknown to the patient.  Brother died of brain tumor.  3.  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  Margins negative.  No lymphovascular or perineural invasion. - Received 3 cycles of XELOX followed by Xeloda for total of 6 months.  Oxaliplatin discontinued during cycle 4 secondary to transaminitis, elevated bilirubin and thrombocytopenia.  However he was also treated for hep C with Harvoni after that.   PLAN:  Metastatic colon cancer to the lungs and left adrenal gland: - CT CAP (11/01/2022): Multiple lung nodules stable, few decreased in size.  Stable mediastinal adenopathy.  Stable left adrenal metastasis.  No new lesions. - Last CEA 22.3 on 10/03/2022.  CEA from today pending. - LFTs today are normal.  CBC was grossly  normal with ANC of 1.3. - Continue maintenance bevacizumab, 5-FU/leucovorin every 2 weeks.  RTC 6 weeks for follow-up with repeat CEA and labs.  2.  Lower rib/epigastric pain: - Continue oxycodone 10 mg every 6 hours as needed which is helping him function well.  Pain is well-controlled.  3.  Hypokalemia: - He has been off of potassium for the last 8 weeks.  Potassium is remaining stable.  4.  Difficulty falling asleep: - Continue trazodone as needed.  5.  Hypertension: - Continue Norvasc 10 mg daily.  Blood pressure is well-controlled.   Orders placed this encounter:  No orders of the defined types were placed in this encounter.    Derek Jack, MD [facility]  [phone]

## 2022-11-07 NOTE — Patient Instructions (Signed)
[facility]  Discharge Instructions: Thank you for choosing [facility] to provide your oncology and hematology care.  If you have a lab appointment with the [facility], please come in thru the Main Entrance and check in at the main information desk.  Wear comfortable clothing and clothing appropriate for easy access to any Portacath or PICC line.   We strive to give you quality time with your provider. You may need to reschedule your appointment if you arrive late (15 or more minutes).  Arriving late affects you and other patients whose appointments are after yours.  Also, if you miss three or more appointments without notifying the office, you may be dismissed from the clinic at the provider's discretion.      For prescription refill requests, have your pharmacy contact our office and allow 72 hours for refills to be completed.    Today you received the following chemotherapy and/or immunotherapy agents bevacizumab, leucovorin and 5FU, return as scheduled.   To help prevent nausea and vomiting after your treatment, we encourage you to take your nausea medication as directed.  BELOW ARE SYMPTOMS THAT SHOULD BE REPORTED IMMEDIATELY: *FEVER GREATER THAN 100.4 F (38 C) OR HIGHER *CHILLS OR SWEATING *NAUSEA AND VOMITING THAT IS NOT CONTROLLED WITH YOUR NAUSEA MEDICATION *UNUSUAL SHORTNESS OF BREATH *UNUSUAL BRUISING OR BLEEDING *URINARY PROBLEMS (pain or burning when urinating, or frequent urination) *BOWEL PROBLEMS (unusual diarrhea, constipation, pain near the anus) TENDERNESS IN MOUTH AND THROAT WITH OR WITHOUT PRESENCE OF ULCERS (sore throat, sores in mouth, or a toothache) UNUSUAL RASH, SWELLING OR PAIN  UNUSUAL VAGINAL DISCHARGE OR ITCHING   Items with * indicate a potential emergency and should be followed up as soon as possible or go to the Emergency Department if any problems should occur.  Please show the CHEMOTHERAPY ALERT CARD or  IMMUNOTHERAPY ALERT CARD at check-in to the Emergency Department and triage nurse.  Should you have questions after your visit or need to cancel or reschedule your appointment, please contact [facility] [phone]  and follow the prompts.  Office hours are 8:00 a.m. to 4:30 p.m. Monday - Friday. Please note that voicemails left after 4:00 p.m. may not be returned until the following business day.  We are closed weekends and major holidays. You have access to a nurse at all times for urgent questions. Please call the main number to the clinic [phone] and follow the prompts.  For any non-urgent questions, you may also contact your provider using MyChart. We now offer e-Visits for anyone 52 and older to request care online for non-urgent symptoms. For details visit [email or web address].   Also download the MyChart app! Go to the app store, search "MyChart", open the app, select [facility], and log in with your MyChart username and password.  Masks are optional in the cancer centers. If you would like for your care team to wear a mask while they are taking care of you, please let them know. You may have one support person who is at least 65 years old accompany you for your appointments.

## 2022-11-07 NOTE — Progress Notes (Signed)
Patients port flushed without difficulty.  Good blood return noted with no bruising or swelling noted at site.  Patient remains accessed for chemotherapy treatment.  

## 2022-11-07 NOTE — Progress Notes (Signed)
Patient and labs assessed by Dr. Shirlee Latch, patient is okay for treatment per MD. Patient tolerated chemotherapy with no complaints voiced. Side effects with management reviewed understanding verbalized. Port site clean and dry with no bruising or swelling noted at site. Good blood return noted before and after administration of chemotherapy. Chemo pump connected. Patient left in satisfactory condition with VSS and no s/s of distress noted.

## 2022-11-08 ENCOUNTER — Other Ambulatory Visit: Payer: Self-pay

## 2022-11-09 ENCOUNTER — Inpatient Hospital Stay: Payer: Medicare Other

## 2022-11-09 VITALS — BP 118/76 | HR 63 | Resp 18

## 2022-11-09 DIAGNOSIS — C180 Malignant neoplasm of cecum: Secondary | ICD-10-CM | POA: Diagnosis not present

## 2022-11-09 DIAGNOSIS — Z95828 Presence of other vascular implants and grafts: Secondary | ICD-10-CM

## 2022-11-09 LAB — CEA: CEA: 62.6 ng/mL — ABNORMAL HIGH (ref 0.0–4.7)

## 2022-11-09 MED ORDER — HEPARIN SOD (PORK) LOCK FLUSH 100 UNIT/ML IV SOLN
500.0000 [IU] | Freq: Once | INTRAVENOUS | Status: AC | PRN
  Administered 2022-11-09: 500 [IU]

## 2022-11-09 MED ORDER — SODIUM CHLORIDE 0.9% FLUSH
10.0000 mL | INTRAVENOUS | Status: DC | PRN
  Administered 2022-11-09: 10 mL

## 2022-11-09 NOTE — Patient Instructions (Signed)
[facility]  Discharge Instructions: Thank you for choosing [facility] to provide your oncology and hematology care.  If you have a lab appointment with the [facility], please come in thru the Main Entrance and check in at the main information desk.  Wear comfortable clothing and clothing appropriate for easy access to any Portacath or PICC line.   We strive to give you quality time with your provider. You may need to reschedule your appointment if you arrive late (15 or more minutes).  Arriving late affects you and other patients whose appointments are after yours.  Also, if you miss three or more appointments without notifying the office, you may be dismissed from the clinic at the provider's discretion.      For prescription refill requests, have your pharmacy contact our office and allow 72 hours for refills to be completed.    Today you has your ambulatory pump d/c'd, and port a cath needle removed   To help prevent nausea and vomiting after your treatment, we encourage you to take your nausea medication as directed.  BELOW ARE SYMPTOMS THAT SHOULD BE REPORTED IMMEDIATELY: *FEVER GREATER THAN 100.4 F (38 C) OR HIGHER *CHILLS OR SWEATING *NAUSEA AND VOMITING THAT IS NOT CONTROLLED WITH YOUR NAUSEA MEDICATION *UNUSUAL SHORTNESS OF BREATH *UNUSUAL BRUISING OR BLEEDING *URINARY PROBLEMS (pain or burning when urinating, or frequent urination) *BOWEL PROBLEMS (unusual diarrhea, constipation, pain near the anus) TENDERNESS IN MOUTH AND THROAT WITH OR WITHOUT PRESENCE OF ULCERS (sore throat, sores in mouth, or a toothache) UNUSUAL RASH, SWELLING OR PAIN  UNUSUAL VAGINAL DISCHARGE OR ITCHING   Items with * indicate a potential emergency and should be followed up as soon as possible or go to the Emergency Department if any problems should occur.  Please show the CHEMOTHERAPY ALERT CARD or IMMUNOTHERAPY ALERT CARD at check-in to the Emergency Department  and triage nurse.  Should you have questions after your visit or need to cancel or reschedule your appointment, please contact [facility] [phone]  and follow the prompts.  Office hours are 8:00 a.m. to 4:30 p.m. Monday - Friday. Please note that voicemails left after 4:00 p.m. may not be returned until the following business day.  We are closed weekends and major holidays. You have access to a nurse at all times for urgent questions. Please call the main number to the clinic [phone] and follow the prompts.  For any non-urgent questions, you may also contact your provider using MyChart. We now offer e-Visits for anyone 77 and older to request care online for non-urgent symptoms. For details visit [email or web address].   Also download the MyChart app! Go to the app store, search "MyChart", open the app, select [facility], and log in with your MyChart username and password.  Masks are optional in the cancer centers. If you would like for your care team to wear a mask while they are taking care of you, please let them know. You may have one support person who is at least 65 years old accompany you for your appointments.

## 2022-11-09 NOTE — Progress Notes (Signed)
Evan [name] presents to have home infusion pump d/c'd and for port-a-cath deaccess with flush.  Portacath located right chest wall accessed with  H 20 needle.  Good blood return present. Portacath flushed with NS and 500U/60ml Heparin, and needle removed intact.  Procedure tolerated well and without incident.  Vitals stable and discharged home from clinic ambulatory. Follow up as scheduled.

## 2022-11-14 ENCOUNTER — Ambulatory Visit: Payer: BC Managed Care – PPO | Admitting: Hematology

## 2022-11-14 ENCOUNTER — Other Ambulatory Visit: Payer: BC Managed Care – PPO

## 2022-11-14 ENCOUNTER — Ambulatory Visit: Payer: BC Managed Care – PPO

## 2022-11-21 ENCOUNTER — Inpatient Hospital Stay: Payer: Medicare Other

## 2022-11-21 ENCOUNTER — Inpatient Hospital Stay: Payer: Medicare Other | Attending: Hematology

## 2022-11-21 VITALS — BP 154/85 | HR 60 | Resp 18

## 2022-11-21 VITALS — BP 138/82 | HR 65 | Temp 96.5°F | Resp 18 | Wt 185.6 lb

## 2022-11-21 DIAGNOSIS — C7802 Secondary malignant neoplasm of left lung: Secondary | ICD-10-CM | POA: Diagnosis not present

## 2022-11-21 DIAGNOSIS — Z5112 Encounter for antineoplastic immunotherapy: Secondary | ICD-10-CM | POA: Diagnosis present

## 2022-11-21 DIAGNOSIS — Z5111 Encounter for antineoplastic chemotherapy: Secondary | ICD-10-CM | POA: Diagnosis present

## 2022-11-21 DIAGNOSIS — C180 Malignant neoplasm of cecum: Secondary | ICD-10-CM

## 2022-11-21 DIAGNOSIS — C7972 Secondary malignant neoplasm of left adrenal gland: Secondary | ICD-10-CM | POA: Insufficient documentation

## 2022-11-21 DIAGNOSIS — Z87891 Personal history of nicotine dependence: Secondary | ICD-10-CM | POA: Diagnosis not present

## 2022-11-21 DIAGNOSIS — Z95828 Presence of other vascular implants and grafts: Secondary | ICD-10-CM

## 2022-11-21 DIAGNOSIS — C7801 Secondary malignant neoplasm of right lung: Secondary | ICD-10-CM | POA: Diagnosis not present

## 2022-11-21 DIAGNOSIS — E876 Hypokalemia: Secondary | ICD-10-CM

## 2022-11-21 DIAGNOSIS — C189 Malignant neoplasm of colon, unspecified: Secondary | ICD-10-CM

## 2022-11-21 LAB — URINALYSIS, ROUTINE W REFLEX MICROSCOPIC
Bilirubin Urine: NEGATIVE
Glucose, UA: NEGATIVE mg/dL
Hgb urine dipstick: NEGATIVE
Ketones, ur: NEGATIVE mg/dL
Leukocytes,Ua: NEGATIVE
Nitrite: NEGATIVE
Protein, ur: NEGATIVE mg/dL
Specific Gravity, Urine: 1.017 (ref 1.005–1.030)
pH: 5 (ref 5.0–8.0)

## 2022-11-21 LAB — CBC WITH DIFFERENTIAL/PLATELET
Abs Immature Granulocytes: 0.01 10*3/uL (ref 0.00–0.07)
Basophils Absolute: 0.1 10*3/uL (ref 0.0–0.1)
Basophils Relative: 1 %
Eosinophils Absolute: 0.1 10*3/uL (ref 0.0–0.5)
Eosinophils Relative: 2 %
HCT: 35.3 % — ABNORMAL LOW (ref 39.0–52.0)
Hemoglobin: 11.5 g/dL — ABNORMAL LOW (ref 13.0–17.0)
Immature Granulocytes: 0 %
Lymphocytes Relative: 49 %
Lymphs Abs: 2.4 10*3/uL (ref 0.7–4.0)
MCH: 31.5 pg (ref 26.0–34.0)
MCHC: 32.6 g/dL (ref 30.0–36.0)
MCV: 96.7 fL (ref 80.0–100.0)
Monocytes Absolute: 0.6 10*3/uL (ref 0.1–1.0)
Monocytes Relative: 11 %
Neutro Abs: 1.8 10*3/uL (ref 1.7–7.7)
Neutrophils Relative %: 37 %
Platelets: 106 10*3/uL — ABNORMAL LOW (ref 150–400)
RBC: 3.65 MIL/uL — ABNORMAL LOW (ref 4.22–5.81)
RDW: 15.1 % (ref 11.5–15.5)
WBC: 4.9 10*3/uL (ref 4.0–10.5)
nRBC: 0 % (ref 0.0–0.2)

## 2022-11-21 LAB — COMPREHENSIVE METABOLIC PANEL
ALT: 9 U/L (ref 0–44)
AST: 21 U/L (ref 15–41)
Albumin: 3.8 g/dL (ref 3.5–5.0)
Alkaline Phosphatase: 40 U/L (ref 38–126)
Anion gap: 8 (ref 5–15)
BUN: 8 mg/dL (ref 8–23)
CO2: 25 mmol/L (ref 22–32)
Calcium: 9.1 mg/dL (ref 8.9–10.3)
Chloride: 107 mmol/L (ref 98–111)
Creatinine, Ser: 1.02 mg/dL (ref 0.61–1.24)
GFR, Estimated: >60 mL/min (ref >60)
Glucose, Bld: 127 mg/dL — ABNORMAL HIGH (ref 70–99)
Potassium: 3.4 mmol/L — ABNORMAL LOW (ref 3.5–5.1)
Sodium: 140 mmol/L (ref 135–145)
Total Bilirubin: 0.7 mg/dL (ref 0.3–1.2)
Total Protein: 7.2 g/dL (ref 6.5–8.1)

## 2022-11-21 LAB — MAGNESIUM: Magnesium: 1.8 mg/dL (ref 1.7–2.4)

## 2022-11-21 MED ORDER — SODIUM CHLORIDE 0.9 % IV SOLN
400.0000 mg/m2 | Freq: Once | INTRAVENOUS | Status: AC
  Administered 2022-11-21: 780 mg via INTRAVENOUS
  Filled 2022-11-21: qty 39

## 2022-11-21 MED ORDER — PALONOSETRON HCL INJECTION 0.25 MG/5ML
0.2500 mg | Freq: Once | INTRAVENOUS | Status: AC
  Administered 2022-11-21: 0.25 mg via INTRAVENOUS
  Filled 2022-11-21: qty 5

## 2022-11-21 MED ORDER — SODIUM CHLORIDE 0.9% FLUSH
10.0000 mL | Freq: Once | INTRAVENOUS | Status: AC
  Administered 2022-11-21: 10 mL via INTRAVENOUS

## 2022-11-21 MED ORDER — SODIUM CHLORIDE 0.9 % IV SOLN: Freq: Once | INTRAVENOUS | Status: AC

## 2022-11-21 MED ORDER — SODIUM CHLORIDE 0.9 % IV SOLN
5.0000 mg/kg | Freq: Once | INTRAVENOUS | Status: AC
  Administered 2022-11-21: 400 mg via INTRAVENOUS
  Filled 2022-11-21: qty 16

## 2022-11-21 MED ORDER — SODIUM CHLORIDE 0.9 % IV SOLN
5000.0000 mg | INTRAVENOUS | Status: DC
  Administered 2022-11-21: 5000 mg via INTRAVENOUS
  Filled 2022-11-21: qty 100

## 2022-11-21 MED ORDER — FLUOROURACIL CHEMO INJECTION 2.5 GM/50ML
400.0000 mg/m2 | Freq: Once | INTRAVENOUS | Status: AC
  Administered 2022-11-21: 800 mg via INTRAVENOUS
  Filled 2022-11-21: qty 16

## 2022-11-21 MED ORDER — POTASSIUM CHLORIDE CRYS ER 20 MEQ PO TBCR
40.0000 meq | EXTENDED_RELEASE_TABLET | Freq: Once | ORAL | Status: AC
  Administered 2022-11-21: 40 meq via ORAL
  Filled 2022-11-21: qty 2

## 2022-11-21 MED ORDER — SODIUM CHLORIDE 0.9 % IV SOLN
10.0000 mg | Freq: Once | INTRAVENOUS | Status: AC
  Administered 2022-11-21: 10 mg via INTRAVENOUS
  Filled 2022-11-21: qty 10

## 2022-11-21 NOTE — Progress Notes (Signed)
Patient presents today for chemotherapy infusion.  Patient is in satisfactory condition with no complaints voiced. Vital signs are stable.  Labs reviewed.  All labs are within treatment parameters.  Urine protein is negative.  Potassium today is 3.4.  We will give Klor Con 40 mEq PO x one dose today per standing orders by Dr. Delton Coombes.  We will proceed with treatment per MD orders.    Patient tolerated treatment well with no complaints voiced.  Home infusion 5FU pump connected with no issues.  Patient left ambulatory in stable condition.  Vital signs stable at discharge.  Follow up as scheduled.

## 2022-11-21 NOTE — Progress Notes (Signed)
Patients port flushed without difficulty.  Good blood return noted with no bruising or swelling noted at site.  Stable during access and blood draw.  Patient to remain accessed for treatment. 

## 2022-11-21 NOTE — Patient Instructions (Signed)
[facility] [place]  Discharge Instructions: Thank you for choosing [facility] to provide your oncology and hematology care.  If you have a lab appointment with the [facility], please come in thru the Main Entrance and check in at the main information desk.  Wear comfortable clothing and clothing appropriate for easy access to any Portacath or PICC line.   We strive to give you quality time with your provider. You may need to reschedule your appointment if you arrive late (15 or more minutes).  Arriving late affects you and other patients whose appointments are after yours.  Also, if you miss three or more appointments without notifying the office, you may be dismissed from the clinic at the provider's discretion.      For prescription refill requests, have your pharmacy contact our office and allow 72 hours for refills to be completed.    Today you received the following chemotherapy and/or immunotherapy agents Vegzelma/Fluorouracil/Leucovorin.  Fluorouracil Injection What is this medication? FLUOROURACIL (flure oh YOOR a sil) treats some types of cancer. It works by slowing down the growth of cancer cells. This medicine may be used for other purposes; ask your health care provider or pharmacist if you have questions. COMMON BRAND NAME(S): Adrucil What should I tell my care team before I take this medication? They need to know if you have any of these conditions: Blood disorders Dihydropyrimidine dehydrogenase (DPD) deficiency Infection, such as chickenpox, cold sores, herpes Kidney disease Liver disease Poor nutrition Recent or ongoing radiation therapy An unusual or allergic reaction to fluorouracil, other medications, foods, dyes, or preservatives If you or your partner are pregnant or trying to get pregnant Breast-feeding How should I use this medication? This medication is injected into a vein. It is administered by your care team in a hospital or  clinic setting. Talk to your care team about the use of this medication in children. Special care may be needed. Overdosage: If you think you have taken too much of this medicine contact a poison control center or emergency room at once. NOTE: This medicine is only for you. Do not share this medicine with others. What if I miss a dose? Keep appointments for follow-up doses. It is important not to miss your dose. Call your care team if you are unable to keep an appointment. What may interact with this medication? Do not take this medication with any of the following: Live virus vaccines This medication may also interact with the following: Medications that treat or prevent blood clots, such as warfarin, enoxaparin, dalteparin This list may not describe all possible interactions. Give your health care provider a list of all the medicines, herbs, non-prescription drugs, or dietary supplements you use. Also tell them if you smoke, drink alcohol, or use illegal drugs. Some items may interact with your medicine. What should I watch for while using this medication? Your condition will be monitored carefully while you are receiving this medication. This medication may make you feel generally unwell. This is not uncommon as chemotherapy can affect healthy cells as well as cancer cells. Report any side effects. Continue your course of treatment even though you feel ill unless your care team tells you to stop. In some cases, you may be given additional medications to help with side effects. Follow all directions for their use. This medication may increase your risk of getting an infection. Call your care team for advice if you get a fever, chills, sore throat, or other symptoms  of a cold or flu. Do not treat yourself. Try to avoid being around people who are sick. This medication may increase your risk to bruise or bleed. Call your care team if you notice any unusual bleeding. Be careful brushing or flossing  your teeth or using a toothpick because you may get an infection or bleed more easily. If you have any dental work done, tell your dentist you are receiving this medication. Avoid taking medications that contain aspirin, acetaminophen, ibuprofen, naproxen, or ketoprofen unless instructed by your care team. These medications may hide a fever. Do not treat diarrhea with over the counter products. Contact your care team if you have diarrhea that lasts more than 2 days or if it is severe and watery. This medication can make you more sensitive to the sun. Keep out of the sun. If you cannot avoid being in the sun, wear protective clothing and sunscreen. Do not use sun lamps, tanning beds, or tanning booths. Talk to your care team if you or your partner wish to become pregnant or think you might be pregnant. This medication can cause serious birth defects if taken during pregnancy and for 3 months after the last dose. A reliable form of contraception is recommended while taking this medication and for 3 months after the last dose. Talk to your care team about effective forms of contraception. Do not father a child while taking this medication and for 3 months after the last dose. Use a condom while having sex during this time period. Do not breastfeed while taking this medication. This medication may cause infertility. Talk to your care team if you are concerned about your fertility. What side effects may I notice from receiving this medication? Side effects that you should report to your care team as soon as possible: Allergic reactions--skin rash, itching, hives, swelling of the face, lips, tongue, or throat Heart attack--pain or tightness in the chest, shoulders, arms, or jaw, nausea, shortness of breath, cold or clammy skin, feeling faint or lightheaded Heart failure--shortness of breath, swelling of the ankles, feet, or hands, sudden weight gain, unusual weakness or fatigue Heart rhythm changes--fast or  irregular heartbeat, dizziness, feeling faint or lightheaded, chest pain, trouble breathing High ammonia level--unusual weakness or fatigue, confusion, loss of appetite, nausea, vomiting, seizures Infection--fever, chills, cough, sore throat, wounds that don't heal, pain or trouble when passing urine, general feeling of discomfort or being unwell Low red blood cell level--unusual weakness or fatigue, dizziness, headache, trouble breathing Pain, tingling, or numbness in the hands or feet, muscle weakness, change in vision, confusion or trouble speaking, loss of balance or coordination, trouble walking, seizures Redness, swelling, and blistering of the skin over hands and feet Severe or prolonged diarrhea Unusual bruising or bleeding Side effects that usually do not require medical attention (report to your care team if they continue or are bothersome): Dry skin Headache Increased tears Nausea Pain, redness, or swelling with sores inside the mouth or throat Sensitivity to light Vomiting This list may not describe all possible side effects. Call your doctor for medical advice about side effects. You may report side effects to FDA at 1-800-FDA-1088. Where should I keep my medication? This medication is given in a hospital or clinic. It will not be stored at home. NOTE: This sheet is a summary. It may not cover all possible information. If you have questions about this medicine, talk to your doctor, pharmacist, or health care provider.  2023 Elsevier/Gold Standard (2022-03-28 00:00:00)    Leucovorin  Injection What is this medication? LEUCOVORIN (loo koe VOR in) prevents side effects from certain medications, such as methotrexate. It works by increasing folate levels. This helps protect healthy cells in your body. It may also be used to treat anemia caused by low levels of folate. It can also be used with fluorouracil, a type of chemotherapy, to treat colorectal cancer. It works by increasing  the effects of fluorouracil in the body. This medicine may be used for other purposes; ask your health care provider or pharmacist if you have questions. What should I tell my care team before I take this medication? They need to know if you have any of these conditions: Anemia from low levels of vitamin B12 in the blood An unusual or allergic reaction to leucovorin, folic acid, other medications, foods, dyes, or preservatives Pregnant or trying to get pregnant Breastfeeding How should I use this medication? This medication is injected into a vein or a muscle. It is given by your care team in a hospital or clinic setting. Talk to your care team about the use of this medication in children. Special care may be needed. Overdosage: If you think you have taken too much of this medicine contact a poison control center or emergency room at once. NOTE: This medicine is only for you. Do not share this medicine with others. What if I miss a dose? Keep appointments for follow-up doses. It is important not to miss your dose. Call your care team if you are unable to keep an appointment. What may interact with this medication? Capecitabine Fluorouracil Phenobarbital Phenytoin Primidone Trimethoprim;sulfamethoxazole This list may not describe all possible interactions. Give your health care provider a list of all the medicines, herbs, non-prescription drugs, or dietary supplements you use. Also tell them if you smoke, drink alcohol, or use illegal drugs. Some items may interact with your medicine. What should I watch for while using this medication? Your condition will be monitored carefully while you are receiving this medication. This medication may increase the side effects of 5-fluorouracil. Tell your care team if you have diarrhea or mouth sores that do not get better or that get worse. What side effects may I notice from receiving this medication? Side effects that you should report to your care  team as soon as possible: Allergic reactions--skin rash, itching, hives, swelling of the face, lips, tongue, or throat This list may not describe all possible side effects. Call your doctor for medical advice about side effects. You may report side effects to FDA at 1-800-FDA-1088. Where should I keep my medication? This medication is given in a hospital or clinic. It will not be stored at home. NOTE: This sheet is a summary. It may not cover all possible information. If you have questions about this medicine, talk to your doctor, pharmacist, or health care provider.  2023 Elsevier/Gold Standard (2022-05-02 00:00:00)    Bevacizumab Injection What is this medication? BEVACIZUMAB (be va SIZ yoo mab) treats some types of cancer. It works by blocking a protein that causes cancer cells to grow and multiply. This helps to slow or stop the spread of cancer cells. It is a monoclonal antibody. This medicine may be used for other purposes; ask your health care provider or pharmacist if you have questions. COMMON BRAND NAME(S): Alymsys, Avastin, MVASI, Noah Charon What should I tell my care team before I take this medication? They need to know if you have any of these conditions: Blood clots Coughing up blood Having or recent surgery  Heart failure High blood pressure History of a connection between 2 or more body parts that do not usually connect (fistula) History of a tear in your stomach or intestines Protein in your urine An unusual or allergic reaction to bevacizumab, other medications, foods, dyes, or preservatives Pregnant or trying to get pregnant Breast-feeding How should I use this medication? This medication is injected into a vein. It is given by your care team in a hospital or clinic setting. Talk to your care team the use of this medication in children. Special care may be needed. Overdosage: If you think you have taken too much of this medicine contact a poison control center or  emergency room at once. NOTE: This medicine is only for you. Do not share this medicine with others. What if I miss a dose? Keep appointments for follow-up doses. It is important not to miss your dose. Call your care team if you are unable to keep an appointment. What may interact with this medication? Interactions are not expected. This list may not describe all possible interactions. Give your health care provider a list of all the medicines, herbs, non-prescription drugs, or dietary supplements you use. Also tell them if you smoke, drink alcohol, or use illegal drugs. Some items may interact with your medicine. What should I watch for while using this medication? Your condition will be monitored carefully while you are receiving this medication. You may need blood work while taking this medication. This medication may make you feel generally unwell. This is not uncommon as chemotherapy can affect healthy cells as well as cancer cells. Report any side effects. Continue your course of treatment even though you feel ill unless your care team tells you to stop. This medication may increase your risk to bruise or bleed. Call your care team if you notice any unusual bleeding. Before having surgery, talk to your care team to make sure it is ok. This medication can increase the risk of poor healing of your surgical site or wound. You will need to stop this medication for 28 days before surgery. After surgery, wait at least 28 days before restarting this medication. Make sure the surgical site or wound is healed enough before restarting this medication. Talk to your care team if questions. Talk to your care team if you may be pregnant. Serious birth defects can occur if you take this medication during pregnancy and for 6 months after the last dose. Contraception is recommended while taking this medication and for 6 months after the last dose. Your care team can help you find the option that works for you. Do  not breastfeed while taking this medication and for 6 months after the last dose. This medication can cause infertility. Talk to your care team if you are concerned about your fertility. What side effects may I notice from receiving this medication? Side effects that you should report to your care team as soon as possible: Allergic reactions--skin rash, itching, hives, swelling of the face, lips, tongue, or throat Bleeding--bloody or black, tar-like stools, vomiting blood or Threasa Kinch material that looks like coffee grounds, red or dark Lazara Grieser urine, small red or purple spots on skin, unusual bruising or bleeding Blood clot--pain, swelling, or warmth in the leg, shortness of breath, chest pain Heart attack--pain or tightness in the chest, shoulders, arms, or jaw, nausea, shortness of breath, cold or clammy skin, feeling faint or lightheaded Heart failure--shortness of breath, swelling of the ankles, feet, or hands, sudden weight gain, unusual weakness  or fatigue Increase in blood pressure Infection--fever, chills, cough, sore throat, wounds that don't heal, pain or trouble when passing urine, general feeling of discomfort or being unwell Infusion reactions--chest pain, shortness of breath or trouble breathing, feeling faint or lightheaded Kidney injury--decrease in the amount of urine, swelling of the ankles, hands, or feet Stomach pain that is severe, does not go away, or gets worse Stroke--sudden numbness or weakness of the face, arm, or leg, trouble speaking, confusion, trouble walking, loss of balance or coordination, dizziness, severe headache, change in vision Sudden and severe headache, confusion, change in vision, seizures, which may be signs of posterior reversible encephalopathy syndrome (PRES) Side effects that usually do not require medical attention (report to your care team if they continue or are bothersome): Back pain Change in taste Diarrhea Dry skin Increased tears Nosebleed This  list may not describe all possible side effects. Call your doctor for medical advice about side effects. You may report side effects to FDA at 1-800-FDA-1088. Where should I keep my medication? This medication is given in a hospital or clinic. It will not be stored at home. NOTE: This sheet is a summary. It may not cover all possible information. If you have questions about this medicine, talk to your doctor, pharmacist, or health care provider.  2023 Elsevier/Gold Standard (2022-03-31 00:00:00)        To help prevent nausea and vomiting after your treatment, we encourage you to take your nausea medication as directed.  BELOW ARE SYMPTOMS THAT SHOULD BE REPORTED IMMEDIATELY: *FEVER GREATER THAN 100.4 F (38 C) OR HIGHER *CHILLS OR SWEATING *NAUSEA AND VOMITING THAT IS NOT CONTROLLED WITH YOUR NAUSEA MEDICATION *UNUSUAL SHORTNESS OF BREATH *UNUSUAL BRUISING OR BLEEDING *URINARY PROBLEMS (pain or burning when urinating, or frequent urination) *BOWEL PROBLEMS (unusual diarrhea, constipation, pain near the anus) TENDERNESS IN MOUTH AND THROAT WITH OR WITHOUT PRESENCE OF ULCERS (sore throat, sores in mouth, or a toothache) UNUSUAL RASH, SWELLING OR PAIN  UNUSUAL VAGINAL DISCHARGE OR ITCHING   Items with * indicate a potential emergency and should be followed up as soon as possible or go to the Emergency Department if any problems should occur.  Please show the CHEMOTHERAPY ALERT CARD or IMMUNOTHERAPY ALERT CARD at check-in to the Emergency Department and triage nurse.  Should you have questions after your visit or need to cancel or reschedule your appointment, please contact [facility] [phone]  and follow the prompts.  Office hours are 8:00 a.m. to 4:30 p.m. Monday - Friday. Please note that voicemails left after 4:00 p.m. may not be returned until the following business day.  We are closed weekends and major holidays. You have access to a nurse at all times for  urgent questions. Please call the main number to the clinic [phone] and follow the prompts.  For any non-urgent questions, you may also contact your provider using MyChart. We now offer e-Visits for anyone 54 and older to request care online for non-urgent symptoms. For details visit [email or web address].   Also download the MyChart app! Go to the app store, search "MyChart", open the app, select [facility], and log in with your MyChart username and password.  Masks are optional in the cancer centers. If you would like for your care team to wear a mask while they are taking care of you, please let them know. You may have one support person who is at least 65 years old accompany you for your appointments.

## 2022-11-23 ENCOUNTER — Inpatient Hospital Stay: Payer: Medicare Other

## 2022-11-23 VITALS — BP 141/94 | HR 73 | Temp 97.2°F | Resp 18

## 2022-11-23 DIAGNOSIS — C180 Malignant neoplasm of cecum: Secondary | ICD-10-CM | POA: Diagnosis not present

## 2022-11-23 DIAGNOSIS — Z95828 Presence of other vascular implants and grafts: Secondary | ICD-10-CM

## 2022-11-23 MED ORDER — SODIUM CHLORIDE 0.9% FLUSH
10.0000 mL | INTRAVENOUS | Status: DC | PRN
  Administered 2022-11-23: 10 mL

## 2022-11-23 MED ORDER — HEPARIN SOD (PORK) LOCK FLUSH 100 UNIT/ML IV SOLN
500.0000 [IU] | Freq: Once | INTRAVENOUS | Status: AC | PRN
  Administered 2022-11-23: 500 [IU]

## 2022-11-23 NOTE — Patient Instructions (Signed)
[facility]  Discharge Instructions: Thank you for choosing [facility] to provide your oncology and hematology care.  If you have a lab appointment with the [facility], please come in thru the Main Entrance and check in at the main information desk.  Wear comfortable clothing and clothing appropriate for easy access to any Portacath or PICC line.   We strive to give you quality time with your provider. You may need to reschedule your appointment if you arrive late (15 or more minutes).  Arriving late affects you and other patients whose appointments are after yours.  Also, if you miss three or more appointments without notifying the office, you may be dismissed from the clinic at the provider's discretion.      For prescription refill requests, have your pharmacy contact our office and allow 72 hours for refills to be completed.    Today you received the following chemo pump d/c, return as scheduled.   To help prevent nausea and vomiting after your treatment, we encourage you to take your nausea medication as directed.  BELOW ARE SYMPTOMS THAT SHOULD BE REPORTED IMMEDIATELY: *FEVER GREATER THAN 100.4 F (38 C) OR HIGHER *CHILLS OR SWEATING *NAUSEA AND VOMITING THAT IS NOT CONTROLLED WITH YOUR NAUSEA MEDICATION *UNUSUAL SHORTNESS OF BREATH *UNUSUAL BRUISING OR BLEEDING *URINARY PROBLEMS (pain or burning when urinating, or frequent urination) *BOWEL PROBLEMS (unusual diarrhea, constipation, pain near the anus) TENDERNESS IN MOUTH AND THROAT WITH OR WITHOUT PRESENCE OF ULCERS (sore throat, sores in mouth, or a toothache) UNUSUAL RASH, SWELLING OR PAIN  UNUSUAL VAGINAL DISCHARGE OR ITCHING   Items with * indicate a potential emergency and should be followed up as soon as possible or go to the Emergency Department if any problems should occur.  Please show the CHEMOTHERAPY ALERT CARD or IMMUNOTHERAPY ALERT CARD at check-in to the Emergency Department and  triage nurse.  Should you have questions after your visit or need to cancel or reschedule your appointment, please contact [facility] [phone]  and follow the prompts.  Office hours are 8:00 a.m. to 4:30 p.m. Monday - Friday. Please note that voicemails left after 4:00 p.m. may not be returned until the following business day.  We are closed weekends and major holidays. You have access to a nurse at all times for urgent questions. Please call the main number to the clinic [phone] and follow the prompts.  For any non-urgent questions, you may also contact your provider using MyChart. We now offer e-Visits for anyone 17 and older to request care online for non-urgent symptoms. For details visit [email or web address].   Also download the MyChart app! Go to the app store, search "MyChart", open the app, select [facility], and log in with your MyChart username and password.  Masks are optional in the cancer centers. If you would like for your care team to wear a mask while they are taking care of you, please let them know. You may have one support person who is at least 65 years old accompany you for your appointments.

## 2022-11-23 NOTE — Progress Notes (Signed)
Chemo pump disconnected. Port flushed with good blood return noted. No bruising or swelling at site. Bandaid applied and patient discharged in satisfactory condition. VVS stable with no signs or symptoms of distressed noted.

## 2022-11-28 ENCOUNTER — Ambulatory Visit: Payer: Medicare Other

## 2022-11-28 ENCOUNTER — Other Ambulatory Visit: Payer: Medicare Other

## 2022-11-28 ENCOUNTER — Ambulatory Visit: Payer: Medicare Other | Admitting: Hematology

## 2022-11-29 ENCOUNTER — Other Ambulatory Visit: Payer: Self-pay | Admitting: Hematology

## 2022-11-30 ENCOUNTER — Encounter: Payer: Self-pay | Admitting: *Deleted

## 2022-12-05 ENCOUNTER — Inpatient Hospital Stay: Payer: Medicare Other

## 2022-12-05 VITALS — BP 128/76 | HR 76 | Temp 98.6°F | Resp 18

## 2022-12-05 DIAGNOSIS — Z95828 Presence of other vascular implants and grafts: Secondary | ICD-10-CM

## 2022-12-05 DIAGNOSIS — C180 Malignant neoplasm of cecum: Secondary | ICD-10-CM

## 2022-12-05 DIAGNOSIS — C189 Malignant neoplasm of colon, unspecified: Secondary | ICD-10-CM

## 2022-12-05 LAB — CBC WITH DIFFERENTIAL/PLATELET
Abs Immature Granulocytes: 0.01 10*3/uL (ref 0.00–0.07)
Basophils Absolute: 0.1 10*3/uL (ref 0.0–0.1)
Basophils Relative: 1 %
Eosinophils Absolute: 0.1 10*3/uL (ref 0.0–0.5)
Eosinophils Relative: 2 %
HCT: 34.6 % — ABNORMAL LOW (ref 39.0–52.0)
Hemoglobin: 11.3 g/dL — ABNORMAL LOW (ref 13.0–17.0)
Immature Granulocytes: 0 %
Lymphocytes Relative: 47 %
Lymphs Abs: 2.4 10*3/uL (ref 0.7–4.0)
MCH: 31.5 pg (ref 26.0–34.0)
MCHC: 32.7 g/dL (ref 30.0–36.0)
MCV: 96.4 fL (ref 80.0–100.0)
Monocytes Absolute: 0.7 10*3/uL (ref 0.1–1.0)
Monocytes Relative: 14 %
Neutro Abs: 1.9 10*3/uL (ref 1.7–7.7)
Neutrophils Relative %: 36 %
Platelets: 142 10*3/uL — ABNORMAL LOW (ref 150–400)
RBC: 3.59 MIL/uL — ABNORMAL LOW (ref 4.22–5.81)
RDW: 15 % (ref 11.5–15.5)
WBC: 5.2 10*3/uL (ref 4.0–10.5)
nRBC: 0 % (ref 0.0–0.2)

## 2022-12-05 LAB — COMPREHENSIVE METABOLIC PANEL
ALT: 9 U/L (ref 0–44)
AST: 22 U/L (ref 15–41)
Albumin: 3.6 g/dL (ref 3.5–5.0)
Alkaline Phosphatase: 48 U/L (ref 38–126)
Anion gap: 5 (ref 5–15)
BUN: 12 mg/dL (ref 8–23)
CO2: 27 mmol/L (ref 22–32)
Calcium: 8.7 mg/dL — ABNORMAL LOW (ref 8.9–10.3)
Chloride: 107 mmol/L (ref 98–111)
Creatinine, Ser: 1.14 mg/dL (ref 0.61–1.24)
GFR, Estimated: >60 mL/min (ref >60)
Glucose, Bld: 108 mg/dL — ABNORMAL HIGH (ref 70–99)
Potassium: 3.7 mmol/L (ref 3.5–5.1)
Sodium: 139 mmol/L (ref 135–145)
Total Bilirubin: 0.9 mg/dL (ref 0.3–1.2)
Total Protein: 7 g/dL (ref 6.5–8.1)

## 2022-12-05 LAB — MAGNESIUM: Magnesium: 1.9 mg/dL (ref 1.7–2.4)

## 2022-12-05 LAB — URINALYSIS, DIPSTICK ONLY
Bilirubin Urine: NEGATIVE
Glucose, UA: NEGATIVE mg/dL
Hgb urine dipstick: NEGATIVE
Ketones, ur: 5 mg/dL — AB
Leukocytes,Ua: NEGATIVE
Nitrite: NEGATIVE
Protein, ur: 30 mg/dL — AB
Specific Gravity, Urine: 1.021 (ref 1.005–1.030)
pH: 5 (ref 5.0–8.0)

## 2022-12-05 MED ORDER — SODIUM CHLORIDE 0.9% FLUSH
10.0000 mL | Freq: Once | INTRAVENOUS | Status: AC
  Administered 2022-12-05: 10 mL via INTRAVENOUS

## 2022-12-05 MED ORDER — SODIUM CHLORIDE 0.9 % IV SOLN: Freq: Once | INTRAVENOUS | Status: AC

## 2022-12-05 MED ORDER — SODIUM CHLORIDE 0.9 % IV SOLN
5.0000 mg/kg | Freq: Once | INTRAVENOUS | Status: AC
  Administered 2022-12-05: 400 mg via INTRAVENOUS
  Filled 2022-12-05: qty 16

## 2022-12-05 MED ORDER — FLUOROURACIL CHEMO INJECTION 2.5 GM/50ML
400.0000 mg/m2 | Freq: Once | INTRAVENOUS | Status: AC
  Administered 2022-12-05: 800 mg via INTRAVENOUS
  Filled 2022-12-05: qty 16

## 2022-12-05 MED ORDER — SODIUM CHLORIDE 0.9 % IV SOLN
10.0000 mg | Freq: Once | INTRAVENOUS | Status: AC
  Administered 2022-12-05: 10 mg via INTRAVENOUS
  Filled 2022-12-05: qty 10

## 2022-12-05 MED ORDER — PALONOSETRON HCL INJECTION 0.25 MG/5ML
0.2500 mg | Freq: Once | INTRAVENOUS | Status: AC
  Administered 2022-12-05: 0.25 mg via INTRAVENOUS
  Filled 2022-12-05: qty 5

## 2022-12-05 MED ORDER — SODIUM CHLORIDE 0.9 % IV SOLN
400.0000 mg/m2 | Freq: Once | INTRAVENOUS | Status: AC
  Administered 2022-12-05: 780 mg via INTRAVENOUS
  Filled 2022-12-05: qty 39

## 2022-12-05 MED ORDER — SODIUM CHLORIDE 0.9 % IV SOLN
5000.0000 mg | INTRAVENOUS | Status: DC
  Administered 2022-12-05: 5000 mg via INTRAVENOUS
  Filled 2022-12-05: qty 100

## 2022-12-05 NOTE — Progress Notes (Signed)
Patient presents today for chemotherapy infusion.  Patient is in satisfactory condition with no new complaints voiced.  Vital signs are stable.  Labs reviewed.  All labs are within treatment parameters.  We will proceed with treatment per MD orders.   Patient tolerated treatment well with no complaints voiced.  Home infusion 5FU pump connected with no issues.  Patient left ambulatory in stable condition.  Vital signs stable at discharge.  Follow up as scheduled.

## 2022-12-05 NOTE — Patient Instructions (Signed)
[facility]  Discharge Instructions: Thank you for choosing [facility] to provide your oncology and hematology care.  If you have a lab appointment with the [facility], please come in thru the Main Entrance and check in at the main information desk.  Wear comfortable clothing and clothing appropriate for easy access to any Portacath or PICC line.   We strive to give you quality time with your provider. You may need to reschedule your appointment if you arrive late (15 or more minutes).  Arriving late affects you and other patients whose appointments are after yours.  Also, if you miss three or more appointments without notifying the office, you may be dismissed from the clinic at the provider's discretion.      For prescription refill requests, have your pharmacy contact our office and allow 72 hours for refills to be completed.    Today you received the following chemotherapy and/or immunotherapy agents Vegzelma/Leucovorin/5FU.  Bevacizumab Injection What is this medication? BEVACIZUMAB (be va SIZ yoo mab) treats some types of cancer. It works by blocking a protein that causes cancer cells to grow and multiply. This helps to slow or stop the spread of cancer cells. It is a monoclonal antibody. This medicine may be used for other purposes; ask your health care provider or pharmacist if you have questions. COMMON BRAND NAME(S): Alymsys, Avastin, MVASI, Noah Charon What should I tell my care team before I take this medication? They need to know if you have any of these conditions: Blood clots Coughing up blood Having or recent surgery Heart failure High blood pressure History of a connection between 2 or more body parts that do not usually connect (fistula) History of a tear in your stomach or intestines Protein in your urine An unusual or allergic reaction to bevacizumab, other medications, foods, dyes, or preservatives Pregnant or trying to get  pregnant Breast-feeding How should I use this medication? This medication is injected into a vein. It is given by your care team in a hospital or clinic setting. Talk to your care team the use of this medication in children. Special care may be needed. Overdosage: If you think you have taken too much of this medicine contact a poison control center or emergency room at once. NOTE: This medicine is only for you. Do not share this medicine with others. What if I miss a dose? Keep appointments for follow-up doses. It is important not to miss your dose. Call your care team if you are unable to keep an appointment. What may interact with this medication? Interactions are not expected. This list may not describe all possible interactions. Give your health care provider a list of all the medicines, herbs, non-prescription drugs, or dietary supplements you use. Also tell them if you smoke, drink alcohol, or use illegal drugs. Some items may interact with your medicine. What should I watch for while using this medication? Your condition will be monitored carefully while you are receiving this medication. You may need blood work while taking this medication. This medication may make you feel generally unwell. This is not uncommon as chemotherapy can affect healthy cells as well as cancer cells. Report any side effects. Continue your course of treatment even though you feel ill unless your care team tells you to stop. This medication may increase your risk to bruise or bleed. Call your care team if you notice any unusual bleeding. Before having surgery, talk to your care team to make sure it  is ok. This medication can increase the risk of poor healing of your surgical site or wound. You will need to stop this medication for 28 days before surgery. After surgery, wait at least 28 days before restarting this medication. Make sure the surgical site or wound is healed enough before restarting this medication. Talk  to your care team if questions. Talk to your care team if you may be pregnant. Serious birth defects can occur if you take this medication during pregnancy and for 6 months after the last dose. Contraception is recommended while taking this medication and for 6 months after the last dose. Your care team can help you find the option that works for you. Do not breastfeed while taking this medication and for 6 months after the last dose. This medication can cause infertility. Talk to your care team if you are concerned about your fertility. What side effects may I notice from receiving this medication? Side effects that you should report to your care team as soon as possible: Allergic reactions--skin rash, itching, hives, swelling of the face, lips, tongue, or throat Bleeding--bloody or black, tar-like stools, vomiting blood or Jais Demir material that looks like coffee grounds, red or dark Elmire Amrein urine, small red or purple spots on skin, unusual bruising or bleeding Blood clot--pain, swelling, or warmth in the leg, shortness of breath, chest pain Heart attack--pain or tightness in the chest, shoulders, arms, or jaw, nausea, shortness of breath, cold or clammy skin, feeling faint or lightheaded Heart failure--shortness of breath, swelling of the ankles, feet, or hands, sudden weight gain, unusual weakness or fatigue Increase in blood pressure Infection--fever, chills, cough, sore throat, wounds that don't heal, pain or trouble when passing urine, general feeling of discomfort or being unwell Infusion reactions--chest pain, shortness of breath or trouble breathing, feeling faint or lightheaded Kidney injury--decrease in the amount of urine, swelling of the ankles, hands, or feet Stomach pain that is severe, does not go away, or gets worse Stroke--sudden numbness or weakness of the face, arm, or leg, trouble speaking, confusion, trouble walking, loss of balance or coordination, dizziness, severe headache,  change in vision Sudden and severe headache, confusion, change in vision, seizures, which may be signs of posterior reversible encephalopathy syndrome (PRES) Side effects that usually do not require medical attention (report to your care team if they continue or are bothersome): Back pain Change in taste Diarrhea Dry skin Increased tears Nosebleed This list may not describe all possible side effects. Call your doctor for medical advice about side effects. You may report side effects to FDA at 1-800-FDA-1088. Where should I keep my medication? This medication is given in a hospital or clinic. It will not be stored at home. NOTE: This sheet is a summary. It may not cover all possible information. If you have questions about this medicine, talk to your doctor, pharmacist, or health care provider.  2023 Elsevier/[employer] Standard (2022-03-31 00:00:00)    Leucovorin Injection What is this medication? LEUCOVORIN (loo koe VOR in) prevents side effects from certain medications, such as methotrexate. It works by increasing folate levels. This helps protect healthy cells in your body. It may also be used to treat anemia caused by low levels of folate. It can also be used with fluorouracil, a type of chemotherapy, to treat colorectal cancer. It works by increasing the effects of fluorouracil in the body. This medicine may be used for other purposes; ask your health care provider or pharmacist if you have questions. What should I tell  my care team before I take this medication? They need to know if you have any of these conditions: Anemia from low levels of vitamin B12 in the blood An unusual or allergic reaction to leucovorin, folic acid, other medications, foods, dyes, or preservatives Pregnant or trying to get pregnant Breastfeeding How should I use this medication? This medication is injected into a vein or a muscle. It is given by your care team in a hospital or clinic setting. Talk to your care  team about the use of this medication in children. Special care may be needed. Overdosage: If you think you have taken too much of this medicine contact a poison control center or emergency room at once. NOTE: This medicine is only for you. Do not share this medicine with others. What if I miss a dose? Keep appointments for follow-up doses. It is important not to miss your dose. Call your care team if you are unable to keep an appointment. What may interact with this medication? Capecitabine Fluorouracil Phenobarbital Phenytoin Primidone Trimethoprim;sulfamethoxazole This list may not describe all possible interactions. Give your health care provider a list of all the medicines, herbs, non-prescription drugs, or dietary supplements you use. Also tell them if you smoke, drink alcohol, or use illegal drugs. Some items may interact with your medicine. What should I watch for while using this medication? Your condition will be monitored carefully while you are receiving this medication. This medication may increase the side effects of 5-fluorouracil. Tell your care team if you have diarrhea or mouth sores that do not get better or that get worse. What side effects may I notice from receiving this medication? Side effects that you should report to your care team as soon as possible: Allergic reactions--skin rash, itching, hives, swelling of the face, lips, tongue, or throat This list may not describe all possible side effects. Call your doctor for medical advice about side effects. You may report side effects to FDA at 1-800-FDA-1088. Where should I keep my medication? This medication is given in a hospital or clinic. It will not be stored at home. NOTE: This sheet is a summary. It may not cover all possible information. If you have questions about this medicine, talk to your doctor, pharmacist, or health care provider.  2023 Elsevier/[employer] Standard (2022-05-02 00:00:00)    Fluorouracil  Injection What is this medication? FLUOROURACIL (flure oh YOOR a sil) treats some types of cancer. It works by slowing down the growth of cancer cells. This medicine may be used for other purposes; ask your health care provider or pharmacist if you have questions. COMMON BRAND NAME(S): Adrucil What should I tell my care team before I take this medication? They need to know if you have any of these conditions: Blood disorders Dihydropyrimidine dehydrogenase (DPD) deficiency Infection, such as chickenpox, cold sores, herpes Kidney disease Liver disease Poor nutrition Recent or ongoing radiation therapy An unusual or allergic reaction to fluorouracil, other medications, foods, dyes, or preservatives If you or your partner are pregnant or trying to get pregnant Breast-feeding How should I use this medication? This medication is injected into a vein. It is administered by your care team in a hospital or clinic setting. Talk to your care team about the use of this medication in children. Special care may be needed. Overdosage: If you think you have taken too much of this medicine contact a poison control center or emergency room at once. NOTE: This medicine is only for you. Do not share this medicine  with others. What if I miss a dose? Keep appointments for follow-up doses. It is important not to miss your dose. Call your care team if you are unable to keep an appointment. What may interact with this medication? Do not take this medication with any of the following: Live virus vaccines This medication may also interact with the following: Medications that treat or prevent blood clots, such as warfarin, enoxaparin, dalteparin This list may not describe all possible interactions. Give your health care provider a list of all the medicines, herbs, non-prescription drugs, or dietary supplements you use. Also tell them if you smoke, drink alcohol, or use illegal drugs. Some items may interact with  your medicine. What should I watch for while using this medication? Your condition will be monitored carefully while you are receiving this medication. This medication may make you feel generally unwell. This is not uncommon as chemotherapy can affect healthy cells as well as cancer cells. Report any side effects. Continue your course of treatment even though you feel ill unless your care team tells you to stop. In some cases, you may be given additional medications to help with side effects. Follow all directions for their use. This medication may increase your risk of getting an infection. Call your care team for advice if you get a fever, chills, sore throat, or other symptoms of a cold or flu. Do not treat yourself. Try to avoid being around people who are sick. This medication may increase your risk to bruise or bleed. Call your care team if you notice any unusual bleeding. Be careful brushing or flossing your teeth or using a toothpick because you may get an infection or bleed more easily. If you have any dental work done, tell your dentist you are receiving this medication. Avoid taking medications that contain aspirin, acetaminophen, ibuprofen, naproxen, or ketoprofen unless instructed by your care team. These medications may hide a fever. Do not treat diarrhea with over the counter products. Contact your care team if you have diarrhea that lasts more than 2 days or if it is severe and watery. This medication can make you more sensitive to the sun. Keep out of the sun. If you cannot avoid being in the sun, wear protective clothing and sunscreen. Do not use sun lamps, tanning beds, or tanning booths. Talk to your care team if you or your partner wish to become pregnant or think you might be pregnant. This medication can cause serious birth defects if taken during pregnancy and for 3 months after the last dose. A reliable form of contraception is recommended while taking this medication and for 3  months after the last dose. Talk to your care team about effective forms of contraception. Do not father a child while taking this medication and for 3 months after the last dose. Use a condom while having sex during this time period. Do not breastfeed while taking this medication. This medication may cause infertility. Talk to your care team if you are concerned about your fertility. What side effects may I notice from receiving this medication? Side effects that you should report to your care team as soon as possible: Allergic reactions--skin rash, itching, hives, swelling of the face, lips, tongue, or throat Heart attack--pain or tightness in the chest, shoulders, arms, or jaw, nausea, shortness of breath, cold or clammy skin, feeling faint or lightheaded Heart failure--shortness of breath, swelling of the ankles, feet, or hands, sudden weight gain, unusual weakness or fatigue Heart rhythm changes--fast or irregular  heartbeat, dizziness, feeling faint or lightheaded, chest pain, trouble breathing High ammonia level--unusual weakness or fatigue, confusion, loss of appetite, nausea, vomiting, seizures Infection--fever, chills, cough, sore throat, wounds that don't heal, pain or trouble when passing urine, general feeling of discomfort or being unwell Low red blood cell level--unusual weakness or fatigue, dizziness, headache, trouble breathing Pain, tingling, or numbness in the hands or feet, muscle weakness, change in vision, confusion or trouble speaking, loss of balance or coordination, trouble walking, seizures Redness, swelling, and blistering of the skin over hands and feet Severe or prolonged diarrhea Unusual bruising or bleeding Side effects that usually do not require medical attention (report to your care team if they continue or are bothersome): Dry skin Headache Increased tears Nausea Pain, redness, or swelling with sores inside the mouth or throat Sensitivity to  light Vomiting This list may not describe all possible side effects. Call your doctor for medical advice about side effects. You may report side effects to FDA at 1-800-FDA-1088. Where should I keep my medication? This medication is given in a hospital or clinic. It will not be stored at home. NOTE: This sheet is a summary. It may not cover all possible information. If you have questions about this medicine, talk to your doctor, pharmacist, or health care provider.  2023 Elsevier/[employer] Standard (2022-03-28 00:00:00)         To help prevent nausea and vomiting after your treatment, we encourage you to take your nausea medication as directed.  BELOW ARE SYMPTOMS THAT SHOULD BE REPORTED IMMEDIATELY: *FEVER GREATER THAN 100.4 F (38 C) OR HIGHER *CHILLS OR SWEATING *NAUSEA AND VOMITING THAT IS NOT CONTROLLED WITH YOUR NAUSEA MEDICATION *UNUSUAL SHORTNESS OF BREATH *UNUSUAL BRUISING OR BLEEDING *URINARY PROBLEMS (pain or burning when urinating, or frequent urination) *BOWEL PROBLEMS (unusual diarrhea, constipation, pain near the anus) TENDERNESS IN MOUTH AND THROAT WITH OR WITHOUT PRESENCE OF ULCERS (sore throat, sores in mouth, or a toothache) UNUSUAL RASH, SWELLING OR PAIN  UNUSUAL VAGINAL DISCHARGE OR ITCHING   Items with * indicate a potential emergency and should be followed up as soon as possible or go to the Emergency Department if any problems should occur.  Please show the CHEMOTHERAPY ALERT CARD or IMMUNOTHERAPY ALERT CARD at check-in to the Emergency Department and triage nurse.  Should you have questions after your visit or need to cancel or reschedule your appointment, please contact [facility] [phone]  and follow the prompts.  Office hours are 8:00 a.m. to 4:30 p.m. Monday - Friday. Please note that voicemails left after 4:00 p.m. may not be returned until the following business day.  We are closed weekends and major holidays. You have access to a  nurse at all times for urgent questions. Please call the main number to the clinic [phone] and follow the prompts.  For any non-urgent questions, you may also contact your provider using MyChart. We now offer e-Visits for anyone 38 and older to request care online for non-urgent symptoms. For details visit [email or web address].   Also download the MyChart app! Go to the app store, search "MyChart", open the app, select [facility], and log in with your MyChart username and password.  Masks are optional in the cancer centers. If you would like for your care team to wear a mask while they are taking care of you, please let them know. You may have one support person who is at least 65 years old accompany you for your appointments.

## 2022-12-07 ENCOUNTER — Inpatient Hospital Stay: Payer: Medicare Other

## 2022-12-07 ENCOUNTER — Other Ambulatory Visit: Payer: Self-pay

## 2022-12-07 VITALS — BP 185/109 | HR 75 | Resp 16

## 2022-12-07 DIAGNOSIS — Z95828 Presence of other vascular implants and grafts: Secondary | ICD-10-CM

## 2022-12-07 DIAGNOSIS — C180 Malignant neoplasm of cecum: Secondary | ICD-10-CM | POA: Diagnosis not present

## 2022-12-07 DIAGNOSIS — I10 Essential (primary) hypertension: Secondary | ICD-10-CM

## 2022-12-07 MED ORDER — HEPARIN SOD (PORK) LOCK FLUSH 100 UNIT/ML IV SOLN
500.0000 [IU] | Freq: Once | INTRAVENOUS | Status: AC | PRN
  Administered 2022-12-07: 500 [IU]

## 2022-12-07 MED ORDER — CLONIDINE HCL 0.1 MG PO TABS
0.2000 mg | ORAL_TABLET | Freq: Once | ORAL | Status: AC
  Administered 2022-12-07: 0.2 mg via ORAL
  Filled 2022-12-07: qty 2

## 2022-12-07 MED ORDER — AMLODIPINE BESYLATE 10 MG PO TABS: 10.0000 mg | ORAL_TABLET | Freq: Every day | ORAL | 3 refills | Status: DC

## 2022-12-07 MED ORDER — SODIUM CHLORIDE 0.9% FLUSH
10.0000 mL | INTRAVENOUS | Status: DC | PRN
  Administered 2022-12-07: 10 mL

## 2022-12-07 NOTE — Patient Instructions (Signed)
[facility]  Discharge Instructions: Thank you for choosing [facility] to provide your oncology and hematology care.  If you have a lab appointment with the Cancer Center, please come in thru the Main Entrance and check in at the main information desk.  Wear comfortable clothing and clothing appropriate for easy access to any Portacath or PICC line.   We strive to give you quality time with your provider. You may need to reschedule your appointment if you arrive late (15 or more minutes).  Arriving late affects you and other patients whose appointments are after yours.  Also, if you miss three or more appointments without notifying the office, you may be dismissed from the clinic at the provider's discretion.      For prescription refill requests, have your pharmacy contact our office and allow 72 hours for refills to be completed.    To help prevent nausea and vomiting after your treatment, we encourage you to take your nausea medication as directed.  BELOW ARE SYMPTOMS THAT SHOULD BE REPORTED IMMEDIATELY: *FEVER GREATER THAN 100.4 F (38 C) OR HIGHER *CHILLS OR SWEATING *NAUSEA AND VOMITING THAT IS NOT CONTROLLED WITH YOUR NAUSEA MEDICATION *UNUSUAL SHORTNESS OF BREATH *UNUSUAL BRUISING OR BLEEDING *URINARY PROBLEMS (pain or burning when urinating, or frequent urination) *BOWEL PROBLEMS (unusual diarrhea, constipation, pain near the anus) TENDERNESS IN MOUTH AND THROAT WITH OR WITHOUT PRESENCE OF ULCERS (sore throat, sores in mouth, or a toothache) UNUSUAL RASH, SWELLING OR PAIN  UNUSUAL VAGINAL DISCHARGE OR ITCHING   Items with * indicate a potential emergency and should be followed up as soon as possible or go to the Emergency Department if any problems should occur.  Please show the CHEMOTHERAPY ALERT CARD or IMMUNOTHERAPY ALERT CARD at check-in to the Emergency Department and triage nurse.  Should you have questions after your visit or need to  cancel or reschedule your appointment, please contact [facility] [phone]  and follow the prompts.  Office hours are 8:00 a.m. to 4:30 p.m. Monday - Friday. Please note that voicemails left after 4:00 p.m. may not be returned until the following business day.  We are closed weekends and major holidays. You have access to a nurse at all times for urgent questions. Please call the main number to the clinic [phone] and follow the prompts.  For any non-urgent questions, you may also contact your provider using MyChart. We now offer e-Visits for anyone 18 and older to request care online for non-urgent symptoms. For details visit [email or web address].   Also download the MyChart app! Go to the app store, search "MyChart", open the app, select [facility], and log in with your MyChart username and password.   

## 2022-12-07 NOTE — Progress Notes (Signed)
Patients port flushed without difficulty.  Good blood return noted with no bruising or swelling noted at site.  Home infusion 5FU pump disconnected with no issues.  BP was elevated at arrival.  MD made aware.  We will give Clonidine 0.2 mg x one dose today per Dr. Delton Coombes.  MD did not suggest patient waiting and rechecking BP.  Band aid applied.  VSS with discharge and left in satisfactory condition with no s/s of distress noted.

## 2022-12-12 ENCOUNTER — Ambulatory Visit: Payer: Medicare Other

## 2022-12-12 ENCOUNTER — Other Ambulatory Visit: Payer: Medicare Other

## 2022-12-12 ENCOUNTER — Ambulatory Visit: Payer: Medicare Other | Admitting: Hematology

## 2022-12-19 ENCOUNTER — Inpatient Hospital Stay: Payer: Medicare Other | Attending: Hematology

## 2022-12-19 ENCOUNTER — Encounter: Payer: Self-pay | Admitting: Hematology

## 2022-12-19 ENCOUNTER — Inpatient Hospital Stay: Payer: Medicare Other

## 2022-12-19 ENCOUNTER — Inpatient Hospital Stay (HOSPITAL_BASED_OUTPATIENT_CLINIC_OR_DEPARTMENT_OTHER): Payer: Medicare Other | Admitting: Hematology

## 2022-12-19 VITALS — BP 165/88 | HR 63 | Temp 98.1°F | Resp 18

## 2022-12-19 DIAGNOSIS — K5909 Other constipation: Secondary | ICD-10-CM | POA: Diagnosis not present

## 2022-12-19 DIAGNOSIS — I10 Essential (primary) hypertension: Secondary | ICD-10-CM | POA: Diagnosis not present

## 2022-12-19 DIAGNOSIS — Z79899 Other long term (current) drug therapy: Secondary | ICD-10-CM | POA: Insufficient documentation

## 2022-12-19 DIAGNOSIS — Z95828 Presence of other vascular implants and grafts: Secondary | ICD-10-CM

## 2022-12-19 DIAGNOSIS — C7801 Secondary malignant neoplasm of right lung: Secondary | ICD-10-CM | POA: Diagnosis not present

## 2022-12-19 DIAGNOSIS — C180 Malignant neoplasm of cecum: Secondary | ICD-10-CM

## 2022-12-19 DIAGNOSIS — Z1509 Genetic susceptibility to other malignant neoplasm: Secondary | ICD-10-CM | POA: Insufficient documentation

## 2022-12-19 DIAGNOSIS — Z87891 Personal history of nicotine dependence: Secondary | ICD-10-CM | POA: Insufficient documentation

## 2022-12-19 DIAGNOSIS — C189 Malignant neoplasm of colon, unspecified: Secondary | ICD-10-CM

## 2022-12-19 DIAGNOSIS — C7972 Secondary malignant neoplasm of left adrenal gland: Secondary | ICD-10-CM | POA: Insufficient documentation

## 2022-12-19 DIAGNOSIS — Z5112 Encounter for antineoplastic immunotherapy: Secondary | ICD-10-CM | POA: Diagnosis not present

## 2022-12-19 DIAGNOSIS — Z5111 Encounter for antineoplastic chemotherapy: Secondary | ICD-10-CM | POA: Insufficient documentation

## 2022-12-19 LAB — URINALYSIS, DIPSTICK ONLY
Bilirubin Urine: NEGATIVE
Glucose, UA: NEGATIVE mg/dL
Hgb urine dipstick: NEGATIVE
Ketones, ur: NEGATIVE mg/dL
Leukocytes,Ua: NEGATIVE
Nitrite: NEGATIVE
Protein, ur: NEGATIVE mg/dL
Specific Gravity, Urine: 1.014 (ref 1.005–1.030)
pH: 5 (ref 5.0–8.0)

## 2022-12-19 LAB — COMPREHENSIVE METABOLIC PANEL
ALT: 9 U/L (ref 0–44)
AST: 19 U/L (ref 15–41)
Albumin: 3.8 g/dL (ref 3.5–5.0)
Alkaline Phosphatase: 44 U/L (ref 38–126)
Anion gap: 9 (ref 5–15)
BUN: 11 mg/dL (ref 8–23)
CO2: 25 mmol/L (ref 22–32)
Calcium: 9.2 mg/dL (ref 8.9–10.3)
Chloride: 104 mmol/L (ref 98–111)
Creatinine, Ser: 0.98 mg/dL (ref 0.61–1.24)
GFR, Estimated: >60 mL/min (ref >60)
Glucose, Bld: 99 mg/dL (ref 70–99)
Potassium: 3.8 mmol/L (ref 3.5–5.1)
Sodium: 138 mmol/L (ref 135–145)
Total Bilirubin: 0.8 mg/dL (ref 0.3–1.2)
Total Protein: 7.2 g/dL (ref 6.5–8.1)

## 2022-12-19 LAB — CBC WITH DIFFERENTIAL/PLATELET
Abs Immature Granulocytes: 0.02 10*3/uL (ref 0.00–0.07)
Basophils Absolute: 0.1 10*3/uL (ref 0.0–0.1)
Basophils Relative: 1 %
Eosinophils Absolute: 0.1 10*3/uL (ref 0.0–0.5)
Eosinophils Relative: 2 %
HCT: 38.1 % — ABNORMAL LOW (ref 39.0–52.0)
Hemoglobin: 12.3 g/dL — ABNORMAL LOW (ref 13.0–17.0)
Immature Granulocytes: 0 %
Lymphocytes Relative: 43 %
Lymphs Abs: 2.2 10*3/uL (ref 0.7–4.0)
MCH: 31.1 pg (ref 26.0–34.0)
MCHC: 32.3 g/dL (ref 30.0–36.0)
MCV: 96.5 fL (ref 80.0–100.0)
Monocytes Absolute: 0.7 10*3/uL (ref 0.1–1.0)
Monocytes Relative: 14 %
Neutro Abs: 2 10*3/uL (ref 1.7–7.7)
Neutrophils Relative %: 40 %
Platelets: 139 10*3/uL — ABNORMAL LOW (ref 150–400)
RBC: 3.95 MIL/uL — ABNORMAL LOW (ref 4.22–5.81)
RDW: 15.4 % (ref 11.5–15.5)
WBC: 5.1 10*3/uL (ref 4.0–10.5)
nRBC: 0 % (ref 0.0–0.2)

## 2022-12-19 LAB — MAGNESIUM: Magnesium: 1.9 mg/dL (ref 1.7–2.4)

## 2022-12-19 MED ORDER — FLUOROURACIL CHEMO INJECTION 2.5 GM/50ML
400.0000 mg/m2 | Freq: Once | INTRAVENOUS | Status: AC
  Administered 2022-12-19: 800 mg via INTRAVENOUS
  Filled 2022-12-19: qty 16

## 2022-12-19 MED ORDER — SODIUM CHLORIDE 0.9 % IV SOLN: Freq: Once | INTRAVENOUS | Status: AC

## 2022-12-19 MED ORDER — SODIUM CHLORIDE 0.9 % IV SOLN
5000.0000 mg | INTRAVENOUS | Status: DC
  Administered 2022-12-19: 5000 mg via INTRAVENOUS
  Filled 2022-12-19: qty 100

## 2022-12-19 MED ORDER — PALONOSETRON HCL INJECTION 0.25 MG/5ML
0.2500 mg | Freq: Once | INTRAVENOUS | Status: AC
  Administered 2022-12-19: 0.25 mg via INTRAVENOUS
  Filled 2022-12-19: qty 5

## 2022-12-19 MED ORDER — SODIUM CHLORIDE 0.9 % IV SOLN
5.0000 mg/kg | Freq: Once | INTRAVENOUS | Status: AC
  Administered 2022-12-19: 400 mg via INTRAVENOUS
  Filled 2022-12-19: qty 16

## 2022-12-19 MED ORDER — AMLODIPINE BESYLATE 5 MG PO TABS
10.0000 mg | ORAL_TABLET | Freq: Once | ORAL | Status: AC
  Administered 2022-12-19: 10 mg via ORAL
  Filled 2022-12-19: qty 2

## 2022-12-19 MED ORDER — SODIUM CHLORIDE 0.9 % IV SOLN
10.0000 mg | Freq: Once | INTRAVENOUS | Status: AC
  Administered 2022-12-19: 10 mg via INTRAVENOUS
  Filled 2022-12-19: qty 10

## 2022-12-19 MED ORDER — SODIUM CHLORIDE 0.9% FLUSH
10.0000 mL | Freq: Once | INTRAVENOUS | Status: AC
  Administered 2022-12-19: 10 mL via INTRAVENOUS

## 2022-12-19 MED ORDER — SODIUM CHLORIDE 0.9 % IV SOLN
400.0000 mg/m2 | Freq: Once | INTRAVENOUS | Status: AC
  Administered 2022-12-19: 780 mg via INTRAVENOUS
  Filled 2022-12-19: qty 39

## 2022-12-19 NOTE — Progress Notes (Signed)
Patient has been examined by Dr. Katragadda, and vital signs and labs have been reviewed. ANC, Creatinine, LFTs, hemoglobin, and platelets are within treatment parameters per M.D. - pt may proceed with treatment.  Primary RN and pharmacy notified.  

## 2022-12-19 NOTE — Addendum Note (Signed)
Addended by: Terrace Arabia on: 12/19/2022 10:49 AM   Modules accepted: Orders

## 2022-12-19 NOTE — Progress Notes (Signed)
Evan [name], [place] [address]   CLINIC:  Medical Oncology/Hematology  PCP:  Lavella Lemons, PA [address] 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  HER2 negative.  TMB low.  MSI-stable.  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  Other targetable mutations negative.  CURRENT THERAPY: K-ras G12 D mutation.  HER2 negative.  TMB low.  MSI-stable.  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  Other targetable mutations negative.  BRIEF ONCOLOGIC HISTORY:  Oncology History  Cecal cancer ([place])  10/07/2019 Initial Diagnosis   Cecal cancer ([place])   10/07/2019 Cancer Staging   Staging form: Colon and Rectum, AJCC 8th Edition - Clinical stage from 10/07/2019: Stage IIIB (cT3, cN1, cM0) - Signed by Derek Jack, MD on 10/07/2019   02/22/2022 - 08/08/2022 Chemotherapy   Patient is on Treatment Plan : COLORECTAL FOLFIRI / BEVACIZUMAB Q14D     02/22/2022 -  Chemotherapy   Patient is on Treatment Plan : COLORECTAL FOLFIRI + Bevacizumab q14d       CANCER STAGING:  Cancer Staging  Cecal cancer ([place]) Staging form: Colon and Rectum, AJCC 8th Edition - Clinical stage from 10/07/2019: Stage IIIB (cT3, cN1, cM0) - Signed by Derek Jack, MD on 10/07/2019 - Pathologic stage from 01/23/2022: Stage IVB (rpTX, pN0, pM1b) - Unsigned   INTERVAL HISTORY:  [name], a 67 y.o. male, seen for follow-up of metastatic colon cancer and toxicity assessment prior to next maintenance treatment.  He reports forgetting taking his blood pressure medication.  He denies any bleeding issues.  Chronic constipation is well-maintained.  Pain is also well-controlled on the current regimen.  Energy levels are 100%.  He is continuing to work full-time.  REVIEW OF SYSTEMS:  Review of Systems  Constitutional:  Negative for  appetite change and unexpected weight change.  Cardiovascular:  Positive for chest pain (Sternal pain).  Neurological:  Negative for headaches.  All other systems reviewed and are negative.   PAST MEDICAL/SURGICAL HISTORY:  Past Medical History:  Diagnosis Date   Arthritis    Colon cancer ([place])    colon   Hepatitis C    Hypertension    Port-A-Cath in place 02/15/2022   Past Surgical History:  Procedure Laterality Date   COLON SURGERY     PORTACATH PLACEMENT Right 02/08/2022   Procedure: INSERTION PORT-A-CATH- RIJ;  Surgeon: Rusty Aus, DO;  Location: [facility];  Service: General;  Laterality: Right;   REPLACEMENT TOTAL KNEE Left    SHOULDER ARTHROSCOPY Bilateral    TOTAL HIP ARTHROPLASTY Right 11/09/2020   Procedure: TOTAL HIP ARTHROPLASTY ANTERIOR APPROACH;  Surgeon: Renette Butters, MD;  Location: [facility];  Service: Orthopedics;  Laterality: Right;   WRIST SURGERY Left     SOCIAL HISTORY:  Social History   Socioeconomic History   Marital status: Married    Spouse name: Not on file   Number of children: 7   Years of education: Not on file   Highest education level: Not on file  Occupational History   Occupation: employed  Tobacco Use   Smoking status: Former    Packs/day: 1.50    Years: 20.00    Total pack years: 30.00    Types: Cigarettes    Quit date: 11/19/2005    Years since quitting: 73.0  Smokeless tobacco: Never  Vaping Use   Vaping Use: Never used  Substance and Sexual Activity   Alcohol use: Not Currently   Drug use: Never   Sexual activity: Yes  Other Topics Concern   Not on file  Social History Narrative   ** Merged History Encounter **       Separated from wife 11/2021   Social Determinants of Health   Financial Resource Strain: Low Risk  (10/07/2019)   Overall Financial Resource Strain (CARDIA)    Difficulty of Paying Living Expenses: Not very hard  Food Insecurity: No Food Insecurity (10/07/2019)   Hunger Vital Sign    Worried  About Running Out of Food in the Last Year: Never true    Ran Out of Food in the Last Year: Never true  Transportation Needs: No Transportation Needs (10/07/2019)   PRAPARE - Hydrologist (Medical): No    Lack of Transportation (Non-Medical): No  Physical Activity: Inactive (10/07/2019)   Exercise Vital Sign    Days of Exercise per Week: 0 days    Minutes of Exercise per Session: 0 min  Stress: No Stress Concern Present (10/07/2019)   [place]    Feeling of Stress : Not at all  Social Connections: Moderately Integrated (10/07/2019)   Social Connection and Isolation Panel [NHANES]    Frequency of Communication with Friends and Family: Once a week    Frequency of Social Gatherings with Friends and Family: Once a week    Attends Religious Services: More than 4 times per year    Active Member of [employer] or Organizations: Yes    Attends Music therapist: More than 4 times per year    Marital Status: Married  Human resources officer Violence: Not At Risk (10/07/2019)   Humiliation, Afraid, Rape, and Kick questionnaire    Fear of Current or Ex-Partner: No    Emotionally Abused: No    Physically Abused: No    Sexually Abused: No    FAMILY HISTORY:  Family History  Problem Relation Age of Onset   Heart disease Mother    Dementia Father    Heart disease Sister    Cancer Brother    Hypertension Brother    Hypertension Brother    Healthy Son    Healthy Son    Healthy Son    Healthy Son    Healthy Daughter    Healthy Daughter    Healthy Daughter     CURRENT MEDICATIONS:  Current Outpatient Medications  Medication Sig Dispense Refill   aluminum-magnesium hydroxide-simethicone (MAALOX) 825-003-70 MG/5ML SUSP Take 30 mLs by mouth 4 (four) times daily -  before meals and at bedtime. 1680 mL 2   amLODipine (NORVASC) 10 MG tablet Take 1 tablet (10 mg total) by mouth daily. 90 tablet 3    Bevacizumab (AVASTIN IV) Inject into the vein every 14 (fourteen) days. *start date TBD     ENULOSE 10 GM/15ML SOLN Take by mouth.     fluorouracil CALGB 48889 2,400 mg/m2 in sodium chloride 0.9 % 150 mL Inject 2,400 mg/m2 into the vein over 48 hr.     FLUOROURACIL IV Inject into the vein every 14 (fourteen) days.     Lactulose 20 GM/30ML SOLN Take 15 mLs (10 g total) by mouth at bedtime. Take 15 ml at bedtime every night to assist with regular bowel movements.  Titrate down if having multiple bowel movements.  If a bowel  movement has not occurred in 3 to 4 days or longer, then take 15 ml every 3 hours until a bowel movent has occurred. 450 mL 1   LEUCOVORIN CALCIUM IV Inject into the vein every 14 (fourteen) days.     megestrol (MEGACE) 400 MG/10ML suspension Take 10 mLs (400 mg total) by mouth 2 (two) times daily. 480 mL 3   Oxycodone HCl 10 MG TABS TAKE ONE TABLET BY MOUTH EVERY 6 HOURS AS NEEDED 120 tablet 0   potassium chloride SA (KLOR-CON M) 20 MEQ tablet Take 1 tablet (20 mEq total) by mouth 2 (two) times daily. 30 tablet 3   traZODone (DESYREL) 50 MG tablet Take 1 tablet (50 mg total) by mouth at bedtime. 30 tablet 3   vitamin B-12 (CYANOCOBALAMIN) 1000 MCG tablet Take 1 tablet (1,000 mcg total) by mouth daily. 30 tablet 6   No current facility-administered medications for this visit.    ALLERGIES:  No Known Allergies  PHYSICAL EXAM:  Performance status (ECOG): 0 - Asymptomatic  There were no vitals filed for this visit.  Wt Readings from Last 3 Encounters:  12/19/22 182 lb 15.7 oz (83 kg)  12/05/22 185 lb 11.2 oz (84.2 kg)  11/21/22 185 lb 9.6 oz (84.2 kg)   Physical Exam Vitals reviewed.  Constitutional:      Appearance: Normal appearance.  Cardiovascular:     Rate and Rhythm: Normal rate and regular rhythm.     Pulses: Normal pulses.     Heart sounds: Normal heart sounds.  Pulmonary:     Effort: Pulmonary effort is normal.     Breath sounds: Normal breath sounds.   Neurological:     General: No focal deficit present.     Mental Status: He is alert and oriented to person, place, and time.  Psychiatric:        Mood and Affect: Mood normal.        Behavior: Behavior normal.    LABORATORY DATA:  I have reviewed the labs as listed.     Latest Ref Rng & Units 12/05/2022    8:22 AM 11/21/2022    8:34 AM 11/07/2022    9:02 AM  CBC  WBC 4.0 - 10.5 K/uL 5.2  4.9  3.9   Hemoglobin 13.0 - 17.0 g/dL 11.3  11.5  11.3   Hematocrit 39.0 - 52.0 % 34.6  35.3  34.2   Platelets 150 - 400 K/uL 142  106  150       Latest Ref Rng & Units 12/05/2022    8:22 AM 11/21/2022    8:34 AM 11/07/2022    9:02 AM  CMP  Glucose 70 - 99 mg/dL 108  127  102   BUN 8 - 23 mg/dL 12  8  16    Creatinine 0.61 - 1.24 mg/dL 1.14  1.02  0.99   Sodium 135 - 145 mmol/L 139  140  135   Potassium 3.5 - 5.1 mmol/L 3.7  3.4  3.8   Chloride 98 - 111 mmol/L 107  107  103   CO2 22 - 32 mmol/L 27  25  26    Calcium 8.9 - 10.3 mg/dL 8.7  9.1  8.9   Total Protein 6.5 - 8.1 g/dL 7.0  7.2  7.2   Total Bilirubin 0.3 - 1.2 mg/dL 0.9  0.7  0.8   Alkaline Phos 38 - 126 U/L 48  40  42   AST 15 - 41 U/L 22  21  19  ALT 0 - 44 U/L 9  9  9      DIAGNOSTIC IMAGING:  I have independently reviewed the scans and discussed with the patient. No results found.   ASSESSMENT:  Left lung mass with multiple lung nodules: - Presentation with dry cough for 6 months. - 30 pound weight loss in the last couple of years, weight stable over the last 6 months. - CT chest with contrast on 12/16/2021 showed bulky left hilar mass measuring 8.1 x 7.5 cm.  Numerous bilateral lung nodules of varying sizes.  Left adrenal nodule measuring 2.8 x 2.2 cm.  Mediastinal and bilateral hilar adenopathy with the largest pretracheal node measuring 4.1 x 3.1 cm. - MRI of the brain from 01/04/2022 which was negative for metastatic disease. - CTAP from 01/03/2022 which showed isolated left adrenal metastasis with no other evidence of  metastatic disease in the abdomen or pelvis. - Pathology of left lung biopsy which shows adenocarcinoma with necrosis.  CK20 positive and CDX2 positive but negative for CK7 indicating colonic primary. 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  HER2 negative.  TMB low.  MSI-stable.  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  Other targetable mutations negative. - FOLFIRI started on 02/22/2022, bevacizumab added with cycle 4  - CT CAP (05/17/2022): Mediastinal and hilar lymph nodes have decreased in size.  Largest perihilar left lower lobe lung mass has decreased in size.  Right upper lobe mass slightly increased in size.  Other nodules are stable.  Left adrenal mass decreased to 1.3 cm from 2.8 cm. - CT scan showed mixed response as it was compared to CT from 12/16/2021.  He did not start chemotherapy until 02/22/2022.  Social/family history: - Lives by himself.  He paints houses for living. - He quit smoking 12 years back and started back again 1 and half year ago and smoked half pack per day.  He quit again about 1 week ago. - Father had cancer, type unknown to the patient.  Brother died of brain tumor.  3.  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  Margins negative.  No lymphovascular or perineural invasion. - Received 3 cycles of XELOX followed by Xeloda for total of 6 months.  Oxaliplatin discontinued during cycle 4 secondary to transaminitis, elevated bilirubin and thrombocytopenia.  However he was also treated for hep C with Harvoni after that.   PLAN:  Metastatic colon cancer to the lungs and left adrenal gland: - CT CAP on 11/01/2022: Multiple lung nodules stable, few decreased in size.  Stable mediastinal adenopathy.  Stable left adrenal metastasis.  No new lesions. - CEA has increased to 62.6 on 11/07/2022. - Reviewed labs today which showed normal LFTs and electrolytes.  CBC was grossly normal. - UA was negative for  protein. - Proceed with maintenance 5-FU and bevacizumab every 2 weeks.  RTC 6 weeks for follow-up with repeat CT CAP.  2.  Lower rib/epigastric pain: - Continue oxycodone 10 mg every 6 hours as needed which is helping him function well.  Pain is also well-controlled.  3.  Difficulty falling asleep: - Continue trial on as needed.  4.  Hypertension: - He will receive Norvasc 1 dose in the office today.  He was instructed to start back on Norvasc and take it daily on a consistent basis.   Orders placed this encounter:  No orders of the defined types were placed in this encounter.    Dirk Dress  Delton Coombes, [place] [phone]

## 2022-12-19 NOTE — Progress Notes (Signed)
Pt presents today for Bevacizumab and Folfiri per provider's order. Vital signs and labs WNL for treatment. Pt's  blood pressure is 155/96 today. Pt will receive Norvac 10 mg p.o x 1 dose. Okay to proceed with treatment today per Dr.K.

## 2022-12-19 NOTE — Patient Instructions (Addendum)
[place] at [facility] Discharge Instructions   You were seen and examined today by Dr. Delton Coombes.  He reviewed the results of your lab work which are normal/stable.   We will proceed with your treatment today and every 2 weeks.   We will repeat a scan in about 4-5 weeks.   Return as scheduled.    Thank you for choosing [place] at [facility] to provide your oncology and hematology care.  To afford each patient quality time with our provider, please arrive at least 15 minutes before your scheduled appointment time.   If you have a lab appointment with the [facility] please come in thru the Main Entrance and check in at the main information desk.  You need to re-schedule your appointment should you arrive 10 or more minutes late.  We strive to give you quality time with our providers, and arriving late affects you and other patients whose appointments are after yours.  Also, if you no show three or more times for appointments you may be dismissed from the clinic at the providers discretion.     Again, thank you for choosing [facility].  Our hope is that these requests will decrease the amount of time that you wait before being seen by our physicians.       _____________________________________________________________  Should you have questions after your visit to [facility], please contact our office at [phone] and follow the prompts.  Our office hours are 8:00 a.m. and 4:30 p.m. Monday - Friday.  Please note that voicemails left after 4:00 p.m. may not be returned until the following business day.  We are closed weekends and major holidays.  You do have access to a nurse 24-7, just call the main number to the clinic [phone] and do not press any options, hold on the line and a nurse will answer the phone.    For prescription refill requests, have your pharmacy contact our office and allow 72 hours.     Due to Covid, you will need to wear a mask upon entering the hospital. If you do not have a mask, a mask will be given to you at the Main Entrance upon arrival. For doctor visits, patients may have 1 support person age 9 or older with them. For treatment visits, patients can not have anyone with them due to social distancing guidelines and our immunocompromised population.

## 2022-12-20 ENCOUNTER — Other Ambulatory Visit: Payer: Self-pay

## 2022-12-20 LAB — CEA: CEA: 45.7 ng/mL — ABNORMAL HIGH (ref 0.0–4.7)

## 2022-12-21 ENCOUNTER — Inpatient Hospital Stay: Payer: Medicare Other

## 2022-12-21 VITALS — BP 113/86 | HR 80 | Temp 96.7°F | Resp 16

## 2022-12-21 DIAGNOSIS — D649 Anemia, unspecified: Secondary | ICD-10-CM

## 2022-12-21 DIAGNOSIS — I10 Essential (primary) hypertension: Secondary | ICD-10-CM | POA: Diagnosis not present

## 2022-12-21 DIAGNOSIS — Z87891 Personal history of nicotine dependence: Secondary | ICD-10-CM | POA: Diagnosis not present

## 2022-12-21 DIAGNOSIS — C7972 Secondary malignant neoplasm of left adrenal gland: Secondary | ICD-10-CM | POA: Diagnosis not present

## 2022-12-21 DIAGNOSIS — C7801 Secondary malignant neoplasm of right lung: Secondary | ICD-10-CM | POA: Diagnosis not present

## 2022-12-21 DIAGNOSIS — Z95828 Presence of other vascular implants and grafts: Secondary | ICD-10-CM

## 2022-12-21 DIAGNOSIS — C180 Malignant neoplasm of cecum: Secondary | ICD-10-CM | POA: Diagnosis not present

## 2022-12-21 DIAGNOSIS — Z5112 Encounter for antineoplastic immunotherapy: Secondary | ICD-10-CM | POA: Diagnosis not present

## 2022-12-21 DIAGNOSIS — Z79899 Other long term (current) drug therapy: Secondary | ICD-10-CM | POA: Diagnosis not present

## 2022-12-21 DIAGNOSIS — K5909 Other constipation: Secondary | ICD-10-CM | POA: Diagnosis not present

## 2022-12-21 DIAGNOSIS — Z5111 Encounter for antineoplastic chemotherapy: Secondary | ICD-10-CM | POA: Diagnosis not present

## 2022-12-21 MED ORDER — HEPARIN SOD (PORK) LOCK FLUSH 100 UNIT/ML IV SOLN
500.0000 [IU] | Freq: Once | INTRAVENOUS | Status: AC | PRN
  Administered 2022-12-21: 500 [IU]

## 2022-12-21 MED ORDER — SODIUM CHLORIDE 0.9% FLUSH
10.0000 mL | INTRAVENOUS | Status: DC | PRN
  Administered 2022-12-21: 10 mL

## 2022-12-21 NOTE — Progress Notes (Signed)
Patients port flushed without difficulty.  Good blood return noted with no bruising or swelling noted at site.  Home infusion 5FU pump disconnected.  Band aid applied.  VSS with discharge and left in satisfactory condition with no s/s of distress noted.   °

## 2022-12-21 NOTE — Patient Instructions (Signed)
[facility]  Discharge Instructions: Thank you for choosing [facility] to provide your oncology and hematology care.  If you have a lab appointment with the Cancer Center, please come in thru the Main Entrance and check in at the main information desk.  Wear comfortable clothing and clothing appropriate for easy access to any Portacath or PICC line.   We strive to give you quality time with your provider. You may need to reschedule your appointment if you arrive late (15 or more minutes).  Arriving late affects you and other patients whose appointments are after yours.  Also, if you miss three or more appointments without notifying the office, you may be dismissed from the clinic at the provider's discretion.      For prescription refill requests, have your pharmacy contact our office and allow 72 hours for refills to be completed.    To help prevent nausea and vomiting after your treatment, we encourage you to take your nausea medication as directed.  BELOW ARE SYMPTOMS THAT SHOULD BE REPORTED IMMEDIATELY: *FEVER GREATER THAN 100.4 F (38 C) OR HIGHER *CHILLS OR SWEATING *NAUSEA AND VOMITING THAT IS NOT CONTROLLED WITH YOUR NAUSEA MEDICATION *UNUSUAL SHORTNESS OF BREATH *UNUSUAL BRUISING OR BLEEDING *URINARY PROBLEMS (pain or burning when urinating, or frequent urination) *BOWEL PROBLEMS (unusual diarrhea, constipation, pain near the anus) TENDERNESS IN MOUTH AND THROAT WITH OR WITHOUT PRESENCE OF ULCERS (sore throat, sores in mouth, or a toothache) UNUSUAL RASH, SWELLING OR PAIN  UNUSUAL VAGINAL DISCHARGE OR ITCHING   Items with * indicate a potential emergency and should be followed up as soon as possible or go to the Emergency Department if any problems should occur.  Please show the CHEMOTHERAPY ALERT CARD or IMMUNOTHERAPY ALERT CARD at check-in to the Emergency Department and triage nurse.  Should you have questions after your visit or need to  cancel or reschedule your appointment, please contact [facility] [phone]  and follow the prompts.  Office hours are 8:00 a.m. to 4:30 p.m. Monday - Friday. Please note that voicemails left after 4:00 p.m. may not be returned until the following business day.  We are closed weekends and major holidays. You have access to a nurse at all times for urgent questions. Please call the main number to the clinic [phone] and follow the prompts.  For any non-urgent questions, you may also contact your provider using MyChart. We now offer e-Visits for anyone 18 and older to request care online for non-urgent symptoms. For details visit [email or web address].   Also download the MyChart app! Go to the app store, search "MyChart", open the app, select [facility], and log in with your MyChart username and password.   

## 2022-12-25 DIAGNOSIS — C189 Malignant neoplasm of colon, unspecified: Secondary | ICD-10-CM | POA: Diagnosis not present

## 2022-12-26 ENCOUNTER — Encounter: Payer: Self-pay | Admitting: Hematology

## 2022-12-26 ENCOUNTER — Encounter (HOSPITAL_COMMUNITY): Payer: Self-pay | Admitting: Hematology

## 2022-12-31 ENCOUNTER — Other Ambulatory Visit: Payer: Self-pay

## 2023-01-02 ENCOUNTER — Inpatient Hospital Stay: Payer: Medicare Other | Admitting: Hematology

## 2023-01-02 ENCOUNTER — Inpatient Hospital Stay: Payer: Medicare Other

## 2023-01-02 VITALS — BP 145/86 | HR 65 | Temp 97.6°F | Resp 18

## 2023-01-02 DIAGNOSIS — Z95828 Presence of other vascular implants and grafts: Secondary | ICD-10-CM

## 2023-01-02 DIAGNOSIS — C7972 Secondary malignant neoplasm of left adrenal gland: Secondary | ICD-10-CM | POA: Diagnosis not present

## 2023-01-02 DIAGNOSIS — C189 Malignant neoplasm of colon, unspecified: Secondary | ICD-10-CM | POA: Diagnosis not present

## 2023-01-02 DIAGNOSIS — Z87891 Personal history of nicotine dependence: Secondary | ICD-10-CM | POA: Diagnosis not present

## 2023-01-02 DIAGNOSIS — Z79899 Other long term (current) drug therapy: Secondary | ICD-10-CM | POA: Diagnosis not present

## 2023-01-02 DIAGNOSIS — C180 Malignant neoplasm of cecum: Secondary | ICD-10-CM | POA: Diagnosis not present

## 2023-01-02 DIAGNOSIS — C7801 Secondary malignant neoplasm of right lung: Secondary | ICD-10-CM | POA: Diagnosis not present

## 2023-01-02 DIAGNOSIS — Z5112 Encounter for antineoplastic immunotherapy: Secondary | ICD-10-CM | POA: Diagnosis not present

## 2023-01-02 DIAGNOSIS — Z5111 Encounter for antineoplastic chemotherapy: Secondary | ICD-10-CM | POA: Diagnosis not present

## 2023-01-02 DIAGNOSIS — I10 Essential (primary) hypertension: Secondary | ICD-10-CM | POA: Diagnosis not present

## 2023-01-02 DIAGNOSIS — K5909 Other constipation: Secondary | ICD-10-CM | POA: Diagnosis not present

## 2023-01-02 LAB — CBC WITH DIFFERENTIAL/PLATELET
Abs Immature Granulocytes: 0.04 10*3/uL (ref 0.00–0.07)
Basophils Absolute: 0.1 10*3/uL (ref 0.0–0.1)
Basophils Relative: 2 %
Eosinophils Absolute: 0.2 10*3/uL (ref 0.0–0.5)
Eosinophils Relative: 3 %
HCT: 37 % — ABNORMAL LOW (ref 39.0–52.0)
Hemoglobin: 12 g/dL — ABNORMAL LOW (ref 13.0–17.0)
Immature Granulocytes: 1 %
Lymphocytes Relative: 42 %
Lymphs Abs: 2.3 10*3/uL (ref 0.7–4.0)
MCH: 31.1 pg (ref 26.0–34.0)
MCHC: 32.4 g/dL (ref 30.0–36.0)
MCV: 95.9 fL (ref 80.0–100.0)
Monocytes Absolute: 0.8 10*3/uL (ref 0.1–1.0)
Monocytes Relative: 15 %
Neutro Abs: 2 10*3/uL (ref 1.7–7.7)
Neutrophils Relative %: 37 %
Platelets: 146 10*3/uL — ABNORMAL LOW (ref 150–400)
RBC: 3.86 MIL/uL — ABNORMAL LOW (ref 4.22–5.81)
RDW: 16 % — ABNORMAL HIGH (ref 11.5–15.5)
WBC: 5.4 10*3/uL (ref 4.0–10.5)
nRBC: 0 % (ref 0.0–0.2)

## 2023-01-02 LAB — COMPREHENSIVE METABOLIC PANEL
ALT: 10 U/L (ref 0–44)
AST: 24 U/L (ref 15–41)
Albumin: 3.9 g/dL (ref 3.5–5.0)
Alkaline Phosphatase: 40 U/L (ref 38–126)
Anion gap: 10 (ref 5–15)
BUN: 11 mg/dL (ref 8–23)
CO2: 25 mmol/L (ref 22–32)
Calcium: 8.8 mg/dL — ABNORMAL LOW (ref 8.9–10.3)
Chloride: 101 mmol/L (ref 98–111)
Creatinine, Ser: 0.99 mg/dL (ref 0.61–1.24)
GFR, Estimated: >60 mL/min (ref >60)
Glucose, Bld: 121 mg/dL — ABNORMAL HIGH (ref 70–99)
Potassium: 3.6 mmol/L (ref 3.5–5.1)
Sodium: 136 mmol/L (ref 135–145)
Total Bilirubin: 0.7 mg/dL (ref 0.3–1.2)
Total Protein: 7.3 g/dL (ref 6.5–8.1)

## 2023-01-02 LAB — URINALYSIS, DIPSTICK ONLY
Bilirubin Urine: NEGATIVE
Glucose, UA: NEGATIVE mg/dL
Ketones, ur: NEGATIVE mg/dL
Leukocytes,Ua: NEGATIVE
Nitrite: NEGATIVE
Protein, ur: NEGATIVE mg/dL
Specific Gravity, Urine: 1.017 (ref 1.005–1.030)
pH: 5 (ref 5.0–8.0)

## 2023-01-02 LAB — MAGNESIUM: Magnesium: 1.8 mg/dL (ref 1.7–2.4)

## 2023-01-02 MED ORDER — SODIUM CHLORIDE 0.9 % IV SOLN: Freq: Once | INTRAVENOUS | Status: AC

## 2023-01-02 MED ORDER — PALONOSETRON HCL INJECTION 0.25 MG/5ML
0.2500 mg | Freq: Once | INTRAVENOUS | Status: AC
  Administered 2023-01-02: 0.25 mg via INTRAVENOUS
  Filled 2023-01-02: qty 5

## 2023-01-02 MED ORDER — FLUOROURACIL CHEMO INJECTION 2.5 GM/50ML
400.0000 mg/m2 | Freq: Once | INTRAVENOUS | Status: AC
  Administered 2023-01-02: 800 mg via INTRAVENOUS
  Filled 2023-01-02: qty 16

## 2023-01-02 MED ORDER — SODIUM CHLORIDE 0.9 % IV SOLN
5000.0000 mg | INTRAVENOUS | Status: DC
  Administered 2023-01-02: 5000 mg via INTRAVENOUS
  Filled 2023-01-02: qty 100

## 2023-01-02 MED ORDER — SODIUM CHLORIDE 0.9 % IV SOLN
400.0000 mg/m2 | Freq: Once | INTRAVENOUS | Status: AC
  Administered 2023-01-02: 780 mg via INTRAVENOUS
  Filled 2023-01-02: qty 39

## 2023-01-02 MED ORDER — SODIUM CHLORIDE 0.9% FLUSH
10.0000 mL | INTRAVENOUS | Status: DC | PRN
  Administered 2023-01-02: 10 mL via INTRAVENOUS

## 2023-01-02 MED ORDER — SODIUM CHLORIDE 0.9 % IV SOLN
5.0000 mg/kg | Freq: Once | INTRAVENOUS | Status: AC
  Administered 2023-01-02: 400 mg via INTRAVENOUS
  Filled 2023-01-02: qty 16

## 2023-01-02 MED ORDER — SODIUM CHLORIDE 0.9 % IV SOLN
10.0000 mg | Freq: Once | INTRAVENOUS | Status: AC
  Administered 2023-01-02: 10 mg via INTRAVENOUS
  Filled 2023-01-02: qty 10

## 2023-01-02 NOTE — Progress Notes (Signed)
Labs reviewed today, results meet parameters for treatment. Patients reports no new issues at this time. Will proceed as planned.   Treatment given per orders. Patient tolerated it well without problems. Vitals stable and discharged home from clinic ambulatory. Follow up as scheduled.

## 2023-01-02 NOTE — Patient Instructions (Signed)
[facility]  Discharge Instructions: Thank you for choosing [facility] to provide your oncology and hematology care.  If you have a lab appointment with the Cancer Center, please come in thru the Main Entrance and check in at the main information desk.  Wear comfortable clothing and clothing appropriate for easy access to any Portacath or PICC line.   We strive to give you quality time with your provider. You may need to reschedule your appointment if you arrive late (15 or more minutes).  Arriving late affects you and other patients whose appointments are after yours.  Also, if you miss three or more appointments without notifying the office, you may be dismissed from the clinic at the provider's discretion.      For prescription refill requests, have your pharmacy contact our office and allow 72 hours for refills to be completed.    Today you received the following chemotherapy and/or immunotherapy leucovorin, vegzelma, fluororocil   To help prevent nausea and vomiting after your treatment, we encourage you to take your nausea medication as directed.  BELOW ARE SYMPTOMS THAT SHOULD BE REPORTED IMMEDIATELY: *FEVER GREATER THAN 100.4 F (38 C) OR HIGHER *CHILLS OR SWEATING *NAUSEA AND VOMITING THAT IS NOT CONTROLLED WITH YOUR NAUSEA MEDICATION *UNUSUAL SHORTNESS OF BREATH *UNUSUAL BRUISING OR BLEEDING *URINARY PROBLEMS (pain or burning when urinating, or frequent urination) *BOWEL PROBLEMS (unusual diarrhea, constipation, pain near the anus) TENDERNESS IN MOUTH AND THROAT WITH OR WITHOUT PRESENCE OF ULCERS (sore throat, sores in mouth, or a toothache) UNUSUAL RASH, SWELLING OR PAIN  UNUSUAL VAGINAL DISCHARGE OR ITCHING   Items with * indicate a potential emergency and should be followed up as soon as possible or go to the Emergency Department if any problems should occur.  Please show the CHEMOTHERAPY ALERT CARD or IMMUNOTHERAPY ALERT CARD at check-in  to the Emergency Department and triage nurse.  Should you have questions after your visit or need to cancel or reschedule your appointment, please contact [facility] [phone]  and follow the prompts.  Office hours are 8:00 a.m. to 4:30 p.m. Monday - Friday. Please note that voicemails left after 4:00 p.m. may not be returned until the following business day.  We are closed weekends and major holidays. You have access to a nurse at all times for urgent questions. Please call the main number to the clinic [phone] and follow the prompts.  For any non-urgent questions, you may also contact your provider using MyChart. We now offer e-Visits for anyone 44 and older to request care online for non-urgent symptoms. For details visit [email or web address].   Also download the MyChart app! Go to the app store, search "MyChart", open the app, select , and log in with your MyChart username and password.

## 2023-01-02 NOTE — Progress Notes (Signed)
Patients port flushed without difficulty.  Good blood return noted with no bruising or swelling noted at site.  Patient remains accessed for chemotherapy treatment.  

## 2023-01-03 ENCOUNTER — Other Ambulatory Visit: Payer: Self-pay | Admitting: Hematology

## 2023-01-04 ENCOUNTER — Inpatient Hospital Stay: Payer: Medicare Other

## 2023-01-04 VITALS — BP 119/84 | HR 76 | Temp 98.5°F | Resp 18

## 2023-01-04 DIAGNOSIS — I10 Essential (primary) hypertension: Secondary | ICD-10-CM | POA: Diagnosis not present

## 2023-01-04 DIAGNOSIS — Z79899 Other long term (current) drug therapy: Secondary | ICD-10-CM | POA: Diagnosis not present

## 2023-01-04 DIAGNOSIS — C180 Malignant neoplasm of cecum: Secondary | ICD-10-CM

## 2023-01-04 DIAGNOSIS — Z5112 Encounter for antineoplastic immunotherapy: Secondary | ICD-10-CM | POA: Diagnosis not present

## 2023-01-04 DIAGNOSIS — Z87891 Personal history of nicotine dependence: Secondary | ICD-10-CM | POA: Diagnosis not present

## 2023-01-04 DIAGNOSIS — C7972 Secondary malignant neoplasm of left adrenal gland: Secondary | ICD-10-CM | POA: Diagnosis not present

## 2023-01-04 DIAGNOSIS — Z95828 Presence of other vascular implants and grafts: Secondary | ICD-10-CM

## 2023-01-04 DIAGNOSIS — K5909 Other constipation: Secondary | ICD-10-CM | POA: Diagnosis not present

## 2023-01-04 DIAGNOSIS — C7801 Secondary malignant neoplasm of right lung: Secondary | ICD-10-CM | POA: Diagnosis not present

## 2023-01-04 DIAGNOSIS — Z5111 Encounter for antineoplastic chemotherapy: Secondary | ICD-10-CM | POA: Diagnosis not present

## 2023-01-04 MED ORDER — SODIUM CHLORIDE 0.9% FLUSH
10.0000 mL | INTRAVENOUS | Status: DC | PRN
  Administered 2023-01-04: 10 mL

## 2023-01-04 MED ORDER — HEPARIN SOD (PORK) LOCK FLUSH 100 UNIT/ML IV SOLN
500.0000 [IU] | Freq: Once | INTRAVENOUS | Status: AC | PRN
  Administered 2023-01-04: 500 [IU]

## 2023-01-04 NOTE — Progress Notes (Signed)
Evan [name] presents to have home infusion pump d/c'd and for port-a-cath deaccess with flush.  Portacath located right chest wall accessed with  H 20 needle.  Good blood return present. Portacath flushed with NS and 500U/80ml Heparin, and needle removed intact.  Procedure tolerated well and without incident.   Also patient informed me that his thumbs and fingers are peeling, instructed him to get some "good hydrating lotion" and apply this as often as he can. Will notify Dr. Delton Coombes.   Tried to call patient to let him know I talked to Dr. Delton Coombes, not able to get in touch with pt at this time. Will try and call again tomorrow.

## 2023-01-04 NOTE — Patient Instructions (Signed)
[facility]  Discharge Instructions: Thank you for choosing [facility] to provide your oncology and hematology care.  If you have a lab appointment with the [facility], please come in thru the Main Entrance and check in at the main information desk.  Wear comfortable clothing and clothing appropriate for easy access to any Portacath or PICC line.   We strive to give you quality time with your provider. You may need to reschedule your appointment if you arrive late (15 or more minutes).  Arriving late affects you and other patients whose appointments are after yours.  Also, if you miss three or more appointments without notifying the office, you may be dismissed from the clinic at the provider's discretion.      For prescription refill requests, have your pharmacy contact our office and allow 72 hours for refills to be completed.    Today you had your ambulatory pump disconnected.     To help prevent nausea and vomiting after your treatment, we encourage you to take your nausea medication as directed.  BELOW ARE SYMPTOMS THAT SHOULD BE REPORTED IMMEDIATELY: *FEVER GREATER THAN 100.4 F (38 C) OR HIGHER *CHILLS OR SWEATING *NAUSEA AND VOMITING THAT IS NOT CONTROLLED WITH YOUR NAUSEA MEDICATION *UNUSUAL SHORTNESS OF BREATH *UNUSUAL BRUISING OR BLEEDING *URINARY PROBLEMS (pain or burning when urinating, or frequent urination) *BOWEL PROBLEMS (unusual diarrhea, constipation, pain near the anus) TENDERNESS IN MOUTH AND THROAT WITH OR WITHOUT PRESENCE OF ULCERS (sore throat, sores in mouth, or a toothache) UNUSUAL RASH, SWELLING OR PAIN  UNUSUAL VAGINAL DISCHARGE OR ITCHING   Items with * indicate a potential emergency and should be followed up as soon as possible or go to the Emergency Department if any problems should occur.  Please show the CHEMOTHERAPY ALERT CARD or IMMUNOTHERAPY ALERT CARD at check-in to the Emergency Department and triage  nurse.  Should you have questions after your visit or need to cancel or reschedule your appointment, please contact [facility] [phone]  and follow the prompts.  Office hours are 8:00 a.m. to 4:30 p.m. Monday - Friday. Please note that voicemails left after 4:00 p.m. may not be returned until the following business day.  We are closed weekends and major holidays. You have access to a nurse at all times for urgent questions. Please call the main number to the clinic [phone] and follow the prompts.  For any non-urgent questions, you may also contact your provider using MyChart. We now offer e-Visits for anyone 15 and older to request care online for non-urgent symptoms. For details visit [email or web address].   Also download the MyChart app! Go to the app store, search "MyChart", open the app, select [facility], and log in with your MyChart username and password.

## 2023-01-16 ENCOUNTER — Inpatient Hospital Stay: Payer: Medicare Other | Attending: Hematology

## 2023-01-16 ENCOUNTER — Other Ambulatory Visit: Payer: Self-pay | Admitting: *Deleted

## 2023-01-16 ENCOUNTER — Inpatient Hospital Stay: Payer: Medicare Other | Admitting: Hematology

## 2023-01-16 ENCOUNTER — Inpatient Hospital Stay: Payer: Medicare Other

## 2023-01-16 VITALS — BP 155/97 | HR 75 | Temp 97.6°F | Resp 18

## 2023-01-16 DIAGNOSIS — Z5111 Encounter for antineoplastic chemotherapy: Secondary | ICD-10-CM | POA: Diagnosis not present

## 2023-01-16 DIAGNOSIS — C7972 Secondary malignant neoplasm of left adrenal gland: Secondary | ICD-10-CM | POA: Insufficient documentation

## 2023-01-16 DIAGNOSIS — C7802 Secondary malignant neoplasm of left lung: Secondary | ICD-10-CM | POA: Diagnosis not present

## 2023-01-16 DIAGNOSIS — Z87891 Personal history of nicotine dependence: Secondary | ICD-10-CM | POA: Diagnosis not present

## 2023-01-16 DIAGNOSIS — Z5112 Encounter for antineoplastic immunotherapy: Secondary | ICD-10-CM | POA: Diagnosis not present

## 2023-01-16 DIAGNOSIS — D649 Anemia, unspecified: Secondary | ICD-10-CM

## 2023-01-16 DIAGNOSIS — K5900 Constipation, unspecified: Secondary | ICD-10-CM

## 2023-01-16 DIAGNOSIS — I10 Essential (primary) hypertension: Secondary | ICD-10-CM | POA: Insufficient documentation

## 2023-01-16 DIAGNOSIS — K5909 Other constipation: Secondary | ICD-10-CM | POA: Insufficient documentation

## 2023-01-16 DIAGNOSIS — C189 Malignant neoplasm of colon, unspecified: Secondary | ICD-10-CM

## 2023-01-16 DIAGNOSIS — Z1509 Genetic susceptibility to other malignant neoplasm: Secondary | ICD-10-CM | POA: Insufficient documentation

## 2023-01-16 DIAGNOSIS — Z808 Family history of malignant neoplasm of other organs or systems: Secondary | ICD-10-CM | POA: Insufficient documentation

## 2023-01-16 DIAGNOSIS — C7801 Secondary malignant neoplasm of right lung: Secondary | ICD-10-CM | POA: Diagnosis not present

## 2023-01-16 DIAGNOSIS — G893 Neoplasm related pain (acute) (chronic): Secondary | ICD-10-CM | POA: Insufficient documentation

## 2023-01-16 DIAGNOSIS — Z79899 Other long term (current) drug therapy: Secondary | ICD-10-CM | POA: Insufficient documentation

## 2023-01-16 DIAGNOSIS — Z95828 Presence of other vascular implants and grafts: Secondary | ICD-10-CM

## 2023-01-16 DIAGNOSIS — C180 Malignant neoplasm of cecum: Secondary | ICD-10-CM | POA: Insufficient documentation

## 2023-01-16 LAB — CBC WITH DIFFERENTIAL/PLATELET
Abs Immature Granulocytes: 0.03 10*3/uL (ref 0.00–0.07)
Basophils Absolute: 0.1 10*3/uL (ref 0.0–0.1)
Basophils Relative: 1 %
Eosinophils Absolute: 0.2 10*3/uL (ref 0.0–0.5)
Eosinophils Relative: 3 %
HCT: 39.7 % (ref 39.0–52.0)
Hemoglobin: 13.1 g/dL (ref 13.0–17.0)
Immature Granulocytes: 1 %
Lymphocytes Relative: 34 %
Lymphs Abs: 2 10*3/uL (ref 0.7–4.0)
MCH: 31.7 pg (ref 26.0–34.0)
MCHC: 33 g/dL (ref 30.0–36.0)
MCV: 96.1 fL (ref 80.0–100.0)
Monocytes Absolute: 0.8 10*3/uL (ref 0.1–1.0)
Monocytes Relative: 14 %
Neutro Abs: 2.8 10*3/uL (ref 1.7–7.7)
Neutrophils Relative %: 47 %
Platelets: 137 10*3/uL — ABNORMAL LOW (ref 150–400)
RBC: 4.13 MIL/uL — ABNORMAL LOW (ref 4.22–5.81)
RDW: 15.8 % — ABNORMAL HIGH (ref 11.5–15.5)
WBC: 5.9 10*3/uL (ref 4.0–10.5)
nRBC: 0 % (ref 0.0–0.2)

## 2023-01-16 LAB — COMPREHENSIVE METABOLIC PANEL
ALT: 10 U/L (ref 0–44)
AST: 22 U/L (ref 15–41)
Albumin: 4.1 g/dL (ref 3.5–5.0)
Alkaline Phosphatase: 40 U/L (ref 38–126)
Anion gap: 9 (ref 5–15)
BUN: 10 mg/dL (ref 8–23)
CO2: 26 mmol/L (ref 22–32)
Calcium: 9.4 mg/dL (ref 8.9–10.3)
Chloride: 101 mmol/L (ref 98–111)
Creatinine, Ser: 1.02 mg/dL (ref 0.61–1.24)
GFR, Estimated: >60 mL/min (ref >60)
Glucose, Bld: 100 mg/dL — ABNORMAL HIGH (ref 70–99)
Potassium: 3.8 mmol/L (ref 3.5–5.1)
Sodium: 136 mmol/L (ref 135–145)
Total Bilirubin: 1 mg/dL (ref 0.3–1.2)
Total Protein: 7.9 g/dL (ref 6.5–8.1)

## 2023-01-16 LAB — FERRITIN: Ferritin: 150 ng/mL (ref 24–336)

## 2023-01-16 LAB — IRON AND TIBC
Iron: 73 ug/dL (ref 45–182)
Saturation Ratios: 18 % (ref 17.9–39.5)
TIBC: 406 ug/dL (ref 250–450)
UIBC: 333 ug/dL

## 2023-01-16 LAB — MAGNESIUM: Magnesium: 2 mg/dL (ref 1.7–2.4)

## 2023-01-16 MED ORDER — PALONOSETRON HCL INJECTION 0.25 MG/5ML
0.2500 mg | Freq: Once | INTRAVENOUS | Status: AC
  Administered 2023-01-16: 0.25 mg via INTRAVENOUS
  Filled 2023-01-16: qty 5

## 2023-01-16 MED ORDER — SODIUM CHLORIDE 0.9 % IV SOLN: Freq: Once | INTRAVENOUS | Status: AC

## 2023-01-16 MED ORDER — SODIUM CHLORIDE 0.9% FLUSH
10.0000 mL | Freq: Once | INTRAVENOUS | Status: AC
  Administered 2023-01-16: 10 mL via INTRAVENOUS

## 2023-01-16 MED ORDER — SODIUM CHLORIDE 0.9 % IV SOLN
2400.0000 mg/m2 | INTRAVENOUS | Status: DC
  Administered 2023-01-16: 5000 mg via INTRAVENOUS
  Filled 2023-01-16: qty 100

## 2023-01-16 MED ORDER — LACTULOSE 20 GM/30ML PO SOLN: 10.0000 g | Freq: Every day | ORAL | 1 refills | Status: DC

## 2023-01-16 MED ORDER — SODIUM CHLORIDE 0.9 % IV SOLN
5.0000 mg/kg | Freq: Once | INTRAVENOUS | Status: AC
  Administered 2023-01-16: 400 mg via INTRAVENOUS
  Filled 2023-01-16: qty 16

## 2023-01-16 MED ORDER — SODIUM CHLORIDE 0.9% FLUSH: 10.0000 mL | INTRAVENOUS | Status: DC | PRN

## 2023-01-16 MED ORDER — SODIUM CHLORIDE 0.9 % IV SOLN
10.0000 mg | Freq: Once | INTRAVENOUS | Status: AC
  Administered 2023-01-16: 10 mg via INTRAVENOUS
  Filled 2023-01-16: qty 1

## 2023-01-16 MED ORDER — HEPARIN SOD (PORK) LOCK FLUSH 100 UNIT/ML IV SOLN: 500.0000 [IU] | Freq: Once | INTRAVENOUS | Status: DC | PRN

## 2023-01-16 MED ORDER — SODIUM CHLORIDE 0.9 % IV SOLN
400.0000 mg/m2 | Freq: Once | INTRAVENOUS | Status: AC
  Administered 2023-01-16: 780 mg via INTRAVENOUS
  Filled 2023-01-16: qty 39

## 2023-01-16 MED ORDER — FLUOROURACIL CHEMO INJECTION 2.5 GM/50ML
400.0000 mg/m2 | Freq: Once | INTRAVENOUS | Status: AC
  Administered 2023-01-16: 800 mg via INTRAVENOUS
  Filled 2023-01-16: qty 16

## 2023-01-16 NOTE — Progress Notes (Signed)
Patient presents today for Vegzelma/Leucovorin with 5FU pump start per providers order.  Vital signs within parameters for treatment.  Labs pending.  Labs within parameters for treatment.  Treatment given today per MD orders.  Stable during infusion without adverse affects.  5FU pump connected and verified RUN on the screen with patient.  Vital signs stable.  No complaints at this time.  Discharge from clinic ambulatory in stable condition.  Alert and oriented X 3.  Follow up with [facility] as scheduled.

## 2023-01-16 NOTE — Patient Instructions (Signed)
[facility]  Discharge Instructions: Thank you for choosing [facility] to provide your oncology and hematology care.  If you have a lab appointment with the [facility], please come in thru the Main Entrance and check in at the main information desk.  Wear comfortable clothing and clothing appropriate for easy access to any Portacath or PICC line.   We strive to give you quality time with your provider. You may need to reschedule your appointment if you arrive late (15 or more minutes).  Arriving late affects you and other patients whose appointments are after yours.  Also, if you miss three or more appointments without notifying the office, you may be dismissed from the clinic at the provider's discretion.      For prescription refill requests, have your pharmacy contact our office and allow 72 hours for refills to be completed.    Today you received the following chemotherapy and/or immunotherapy agents leucovorin fluorouracil 5FU pump connect, Vegzelma      To help prevent nausea and vomiting after your treatment, we encourage you to take your nausea medication as directed.  BELOW ARE SYMPTOMS THAT SHOULD BE REPORTED IMMEDIATELY: *FEVER GREATER THAN 100.4 F (38 C) OR HIGHER *CHILLS OR SWEATING *NAUSEA AND VOMITING THAT IS NOT CONTROLLED WITH YOUR NAUSEA MEDICATION *UNUSUAL SHORTNESS OF BREATH *UNUSUAL BRUISING OR BLEEDING *URINARY PROBLEMS (pain or burning when urinating, or frequent urination) *BOWEL PROBLEMS (unusual diarrhea, constipation, pain near the anus) TENDERNESS IN MOUTH AND THROAT WITH OR WITHOUT PRESENCE OF ULCERS (sore throat, sores in mouth, or a toothache) UNUSUAL RASH, SWELLING OR PAIN  UNUSUAL VAGINAL DISCHARGE OR ITCHING   Items with * indicate a potential emergency and should be followed up as soon as possible or go to the Emergency Department if any problems should occur.  Please show the CHEMOTHERAPY ALERT CARD or  IMMUNOTHERAPY ALERT CARD at check-in to the Emergency Department and triage nurse.  Should you have questions after your visit or need to cancel or reschedule your appointment, please contact [facility] [phone]  and follow the prompts.  Office hours are 8:00 a.m. to 4:30 p.m. Monday - Friday. Please note that voicemails left after 4:00 p.m. may not be returned until the following business day.  We are closed weekends and major holidays. You have access to a nurse at all times for urgent questions. Please call the main number to the clinic [phone] and follow the prompts.  For any non-urgent questions, you may also contact your provider using MyChart. We now offer e-Visits for anyone 46 and older to request care online for non-urgent symptoms. For details visit [email or web address].   Also download the MyChart app! Go to the app store, search "MyChart", open the app, select [facility], and log in with your MyChart username and password.

## 2023-01-18 ENCOUNTER — Inpatient Hospital Stay: Payer: Medicare Other

## 2023-01-18 VITALS — BP 112/74 | HR 84 | Temp 98.0°F | Resp 18

## 2023-01-18 DIAGNOSIS — C180 Malignant neoplasm of cecum: Secondary | ICD-10-CM

## 2023-01-18 DIAGNOSIS — Z808 Family history of malignant neoplasm of other organs or systems: Secondary | ICD-10-CM | POA: Diagnosis not present

## 2023-01-18 DIAGNOSIS — I10 Essential (primary) hypertension: Secondary | ICD-10-CM | POA: Diagnosis not present

## 2023-01-18 DIAGNOSIS — Z79899 Other long term (current) drug therapy: Secondary | ICD-10-CM | POA: Diagnosis not present

## 2023-01-18 DIAGNOSIS — G893 Neoplasm related pain (acute) (chronic): Secondary | ICD-10-CM | POA: Diagnosis not present

## 2023-01-18 DIAGNOSIS — Z5112 Encounter for antineoplastic immunotherapy: Secondary | ICD-10-CM | POA: Diagnosis not present

## 2023-01-18 DIAGNOSIS — Z87891 Personal history of nicotine dependence: Secondary | ICD-10-CM | POA: Diagnosis not present

## 2023-01-18 DIAGNOSIS — K5909 Other constipation: Secondary | ICD-10-CM | POA: Diagnosis not present

## 2023-01-18 DIAGNOSIS — Z5111 Encounter for antineoplastic chemotherapy: Secondary | ICD-10-CM | POA: Diagnosis not present

## 2023-01-18 DIAGNOSIS — C7802 Secondary malignant neoplasm of left lung: Secondary | ICD-10-CM | POA: Diagnosis not present

## 2023-01-18 DIAGNOSIS — C7801 Secondary malignant neoplasm of right lung: Secondary | ICD-10-CM | POA: Diagnosis not present

## 2023-01-18 DIAGNOSIS — Z95828 Presence of other vascular implants and grafts: Secondary | ICD-10-CM

## 2023-01-18 DIAGNOSIS — C7972 Secondary malignant neoplasm of left adrenal gland: Secondary | ICD-10-CM | POA: Diagnosis not present

## 2023-01-18 LAB — CEA: CEA: 44.5 ng/mL — ABNORMAL HIGH (ref 0.0–4.7)

## 2023-01-18 MED ORDER — HEPARIN SOD (PORK) LOCK FLUSH 100 UNIT/ML IV SOLN
500.0000 [IU] | Freq: Once | INTRAVENOUS | Status: AC | PRN
  Administered 2023-01-18: 500 [IU]

## 2023-01-18 MED ORDER — SODIUM CHLORIDE 0.9% FLUSH
10.0000 mL | INTRAVENOUS | Status: DC | PRN
  Administered 2023-01-18: 10 mL

## 2023-01-18 NOTE — Progress Notes (Signed)
Evan [name] presents to have home infusion pump d/c'd and for port-a-cath deaccess with flush.  Portacath located right chest wall accessed with  H 20 needle.  Good blood return present. Portacath flushed with NS and 500U/30ml Heparin, and needle removed intact.  Procedure tolerated well and without incident.

## 2023-01-19 ENCOUNTER — Telehealth: Payer: Self-pay | Admitting: Hematology

## 2023-01-19 NOTE — Telephone Encounter (Signed)
Pt has been approved and enrolled in the [employer]

## 2023-01-22 ENCOUNTER — Ambulatory Visit (HOSPITAL_COMMUNITY)
Admission: RE | Admit: 2023-01-22 | Discharge: 2023-01-22 | Disposition: A | Discharge status: Home / Self Care | Payer: Medicare Other | Source: Ambulatory Visit | Attending: Hematology | Admitting: Hematology

## 2023-01-22 DIAGNOSIS — M439 Deforming dorsopathy, unspecified: Secondary | ICD-10-CM | POA: Diagnosis not present

## 2023-01-22 DIAGNOSIS — C180 Malignant neoplasm of cecum: Secondary | ICD-10-CM | POA: Insufficient documentation

## 2023-01-22 DIAGNOSIS — R918 Other nonspecific abnormal finding of lung field: Secondary | ICD-10-CM | POA: Diagnosis not present

## 2023-01-22 DIAGNOSIS — C189 Malignant neoplasm of colon, unspecified: Secondary | ICD-10-CM | POA: Diagnosis not present

## 2023-01-22 DIAGNOSIS — K760 Fatty (change of) liver, not elsewhere classified: Secondary | ICD-10-CM | POA: Diagnosis not present

## 2023-01-22 DIAGNOSIS — K6389 Other specified diseases of intestine: Secondary | ICD-10-CM | POA: Diagnosis not present

## 2023-01-22 DIAGNOSIS — C7800 Secondary malignant neoplasm of unspecified lung: Secondary | ICD-10-CM | POA: Diagnosis not present

## 2023-01-22 DIAGNOSIS — I700 Atherosclerosis of aorta: Secondary | ICD-10-CM | POA: Diagnosis not present

## 2023-01-22 MED ORDER — IOHEXOL 300 MG/ML  SOLN
100.0000 mL | Freq: Once | INTRAMUSCULAR | Status: AC | PRN
  Administered 2023-01-22: 100 mL via INTRAVENOUS

## 2023-01-25 DIAGNOSIS — C189 Malignant neoplasm of colon, unspecified: Secondary | ICD-10-CM | POA: Diagnosis not present

## 2023-01-30 ENCOUNTER — Inpatient Hospital Stay: Payer: Medicare Other

## 2023-01-30 ENCOUNTER — Other Ambulatory Visit: Payer: Self-pay | Admitting: *Deleted

## 2023-01-30 ENCOUNTER — Inpatient Hospital Stay (HOSPITAL_BASED_OUTPATIENT_CLINIC_OR_DEPARTMENT_OTHER): Payer: Medicare Other | Admitting: Hematology

## 2023-01-30 VITALS — BP 151/92 | HR 72 | Temp 97.6°F | Resp 18

## 2023-01-30 DIAGNOSIS — Z5111 Encounter for antineoplastic chemotherapy: Secondary | ICD-10-CM | POA: Diagnosis not present

## 2023-01-30 DIAGNOSIS — K5909 Other constipation: Secondary | ICD-10-CM | POA: Diagnosis not present

## 2023-01-30 DIAGNOSIS — C7802 Secondary malignant neoplasm of left lung: Secondary | ICD-10-CM | POA: Diagnosis not present

## 2023-01-30 DIAGNOSIS — C7972 Secondary malignant neoplasm of left adrenal gland: Secondary | ICD-10-CM | POA: Diagnosis not present

## 2023-01-30 DIAGNOSIS — Z87891 Personal history of nicotine dependence: Secondary | ICD-10-CM | POA: Diagnosis not present

## 2023-01-30 DIAGNOSIS — C180 Malignant neoplasm of cecum: Secondary | ICD-10-CM

## 2023-01-30 DIAGNOSIS — C189 Malignant neoplasm of colon, unspecified: Secondary | ICD-10-CM | POA: Diagnosis not present

## 2023-01-30 DIAGNOSIS — Z95828 Presence of other vascular implants and grafts: Secondary | ICD-10-CM | POA: Diagnosis not present

## 2023-01-30 DIAGNOSIS — Z5112 Encounter for antineoplastic immunotherapy: Secondary | ICD-10-CM | POA: Diagnosis not present

## 2023-01-30 DIAGNOSIS — Z808 Family history of malignant neoplasm of other organs or systems: Secondary | ICD-10-CM | POA: Diagnosis not present

## 2023-01-30 DIAGNOSIS — Z79899 Other long term (current) drug therapy: Secondary | ICD-10-CM | POA: Diagnosis not present

## 2023-01-30 DIAGNOSIS — G893 Neoplasm related pain (acute) (chronic): Secondary | ICD-10-CM | POA: Diagnosis not present

## 2023-01-30 DIAGNOSIS — I10 Essential (primary) hypertension: Secondary | ICD-10-CM | POA: Diagnosis not present

## 2023-01-30 DIAGNOSIS — C7801 Secondary malignant neoplasm of right lung: Secondary | ICD-10-CM | POA: Diagnosis not present

## 2023-01-30 LAB — URINALYSIS, DIPSTICK ONLY
Bilirubin Urine: NEGATIVE
Glucose, UA: NEGATIVE mg/dL
Hgb urine dipstick: NEGATIVE
Ketones, ur: NEGATIVE mg/dL
Leukocytes,Ua: NEGATIVE
Nitrite: NEGATIVE
Protein, ur: NEGATIVE mg/dL
Specific Gravity, Urine: 1.017 (ref 1.005–1.030)
pH: 5 (ref 5.0–8.0)

## 2023-01-30 LAB — CBC WITH DIFFERENTIAL/PLATELET
Abs Immature Granulocytes: 0.03 10*3/uL (ref 0.00–0.07)
Basophils Absolute: 0.1 10*3/uL (ref 0.0–0.1)
Basophils Relative: 1 %
Eosinophils Absolute: 0.1 10*3/uL (ref 0.0–0.5)
Eosinophils Relative: 2 %
HCT: 35.8 % — ABNORMAL LOW (ref 39.0–52.0)
Hemoglobin: 12 g/dL — ABNORMAL LOW (ref 13.0–17.0)
Immature Granulocytes: 1 %
Lymphocytes Relative: 43 %
Lymphs Abs: 2.3 10*3/uL (ref 0.7–4.0)
MCH: 32.1 pg (ref 26.0–34.0)
MCHC: 33.5 g/dL (ref 30.0–36.0)
MCV: 95.7 fL (ref 80.0–100.0)
Monocytes Absolute: 0.9 10*3/uL (ref 0.1–1.0)
Monocytes Relative: 16 %
Neutro Abs: 2 10*3/uL (ref 1.7–7.7)
Neutrophils Relative %: 37 %
Platelets: 121 10*3/uL — ABNORMAL LOW (ref 150–400)
RBC: 3.74 MIL/uL — ABNORMAL LOW (ref 4.22–5.81)
RDW: 15.7 % — ABNORMAL HIGH (ref 11.5–15.5)
WBC: 5.5 10*3/uL (ref 4.0–10.5)
nRBC: 0 % (ref 0.0–0.2)

## 2023-01-30 LAB — COMPREHENSIVE METABOLIC PANEL
ALT: 11 U/L (ref 0–44)
AST: 35 U/L (ref 15–41)
Albumin: 4.1 g/dL (ref 3.5–5.0)
Alkaline Phosphatase: 37 U/L — ABNORMAL LOW (ref 38–126)
Anion gap: 11 (ref 5–15)
BUN: 11 mg/dL (ref 8–23)
CO2: 23 mmol/L (ref 22–32)
Calcium: 8.7 mg/dL — ABNORMAL LOW (ref 8.9–10.3)
Chloride: 102 mmol/L (ref 98–111)
Creatinine, Ser: 1.03 mg/dL (ref 0.61–1.24)
GFR, Estimated: >60 mL/min (ref >60)
Glucose, Bld: 145 mg/dL — ABNORMAL HIGH (ref 70–99)
Potassium: 3.2 mmol/L — ABNORMAL LOW (ref 3.5–5.1)
Sodium: 136 mmol/L (ref 135–145)
Total Bilirubin: 1.4 mg/dL — ABNORMAL HIGH (ref 0.3–1.2)
Total Protein: 7.3 g/dL (ref 6.5–8.1)

## 2023-01-30 LAB — MAGNESIUM: Magnesium: 2 mg/dL (ref 1.7–2.4)

## 2023-01-30 MED ORDER — SODIUM CHLORIDE 0.9 % IV SOLN
10.0000 mg | Freq: Once | INTRAVENOUS | Status: AC
  Administered 2023-01-30: 10 mg via INTRAVENOUS
  Filled 2023-01-30: qty 10

## 2023-01-30 MED ORDER — SODIUM CHLORIDE 0.9 % IV SOLN
400.0000 mg/m2 | Freq: Once | INTRAVENOUS | Status: AC
  Administered 2023-01-30: 780 mg via INTRAVENOUS
  Filled 2023-01-30: qty 39

## 2023-01-30 MED ORDER — SODIUM CHLORIDE 0.9 % IV SOLN
5.0000 mg/kg | Freq: Once | INTRAVENOUS | Status: AC
  Administered 2023-01-30: 400 mg via INTRAVENOUS
  Filled 2023-01-30: qty 16

## 2023-01-30 MED ORDER — PALONOSETRON HCL INJECTION 0.25 MG/5ML
0.2500 mg | Freq: Once | INTRAVENOUS | Status: AC
  Administered 2023-01-30: 0.25 mg via INTRAVENOUS
  Filled 2023-01-30: qty 5

## 2023-01-30 MED ORDER — SODIUM CHLORIDE 0.9 % IV SOLN: Freq: Once | INTRAVENOUS | Status: AC

## 2023-01-30 MED ORDER — SODIUM CHLORIDE 0.9% FLUSH
10.0000 mL | Freq: Once | INTRAVENOUS | Status: AC
  Administered 2023-01-30: 10 mL via INTRAVENOUS

## 2023-01-30 MED ORDER — OXYCODONE HCL 10 MG PO TABS: 10.0000 mg | ORAL_TABLET | ORAL | 0 refills | Status: DC | PRN

## 2023-01-30 MED ORDER — FLUOROURACIL CHEMO INJECTION 2.5 GM/50ML
400.0000 mg/m2 | Freq: Once | INTRAVENOUS | Status: AC
  Administered 2023-01-30: 800 mg via INTRAVENOUS
  Filled 2023-01-30: qty 16

## 2023-01-30 MED ORDER — POTASSIUM CHLORIDE CRYS ER 20 MEQ PO TBCR
40.0000 meq | EXTENDED_RELEASE_TABLET | Freq: Once | ORAL | Status: AC
  Administered 2023-01-30: 40 meq via ORAL
  Filled 2023-01-30: qty 2

## 2023-01-30 MED ORDER — SODIUM CHLORIDE 0.9 % IV SOLN
2400.0000 mg/m2 | INTRAVENOUS | Status: DC
  Administered 2023-01-30: 5000 mg via INTRAVENOUS
  Filled 2023-01-30: qty 100

## 2023-01-30 NOTE — Patient Instructions (Signed)
[facility] [facility]  Discharge Instructions: Thank you for choosing [facility] to provide your oncology and hematology care.  If you have a lab appointment with the [facility], please come in thru the Main Entrance and check in at the main information desk.  Wear comfortable clothing and clothing appropriate for easy access to any Portacath or PICC line.   We strive to give you quality time with your provider. You may need to reschedule your appointment if you arrive late (15 or more minutes).  Arriving late affects you and other patients whose appointments are after yours.  Also, if you miss three or more appointments without notifying the office, you may be dismissed from the clinic at the provider's discretion.      For prescription refill requests, have your pharmacy contact our office and allow 72 hours for refills to be completed.    Today you received the following chemotherapy and/or immunotherapy agents Bevacizumab, Leucovorin, and 5FU   To help prevent nausea and vomiting after your treatment, we encourage you to take your nausea medication as directed.  Bevacizumab Injection What is this medication? BEVACIZUMAB (be va SIZ yoo mab) treats some types of cancer. It works by blocking a protein that causes cancer cells to grow and multiply. This helps to slow or stop the spread of cancer cells. It is a monoclonal antibody. This medicine may be used for other purposes; ask your health care provider or pharmacist if you have questions. COMMON BRAND NAME(S): Alymsys, Avastin, MVASI, Noah Charon What should I tell my care team before I take this medication? They need to know if you have any of these conditions: Blood clots Coughing up blood Having or recent surgery Heart failure High blood pressure History of a connection between 2 or more body parts that do not usually connect (fistula) History of a tear in your stomach or intestines Protein in your  urine An unusual or allergic reaction to bevacizumab, other medications, foods, dyes, or preservatives Pregnant or trying to get pregnant Breast-feeding How should I use this medication? This medication is injected into a vein. It is given by your care team in a hospital or clinic setting. Talk to your care team the use of this medication in children. Special care may be needed. Overdosage: If you think you have taken too much of this medicine contact a poison control center or emergency room at once. NOTE: This medicine is only for you. Do not share this medicine with others. What if I miss a dose? Keep appointments for follow-up doses. It is important not to miss your dose. Call your care team if you are unable to keep an appointment. What may interact with this medication? Interactions are not expected. This list may not describe all possible interactions. Give your health care provider a list of all the medicines, herbs, non-prescription drugs, or dietary supplements you use. Also tell them if you smoke, drink alcohol, or use illegal drugs. Some items may interact with your medicine. What should I watch for while using this medication? Your condition will be monitored carefully while you are receiving this medication. You may need blood work while taking this medication. This medication may make you feel generally unwell. This is not uncommon as chemotherapy can affect healthy cells as well as cancer cells. Report any side effects. Continue your course of treatment even though you feel ill unless your care team tells you to stop. This medication may increase your risk to bruise  or bleed. Call your care team if you notice any unusual bleeding. Before having surgery, talk to your care team to make sure it is ok. This medication can increase the risk of poor healing of your surgical site or wound. You will need to stop this medication for 28 days before surgery. After surgery, wait at least 28  days before restarting this medication. Make sure the surgical site or wound is healed enough before restarting this medication. Talk to your care team if questions. Talk to your care team if you may be pregnant. Serious birth defects can occur if you take this medication during pregnancy and for 6 months after the last dose. Contraception is recommended while taking this medication and for 6 months after the last dose. Your care team can help you find the option that works for you. Do not breastfeed while taking this medication and for 6 months after the last dose. This medication can cause infertility. Talk to your care team if you are concerned about your fertility. What side effects may I notice from receiving this medication? Side effects that you should report to your care team as soon as possible: Allergic reactions--skin rash, itching, hives, swelling of the face, lips, tongue, or throat Bleeding--bloody or black, tar-like stools, vomiting blood or brown material that looks like coffee grounds, red or dark brown urine, small red or purple spots on skin, unusual bruising or bleeding Blood clot--pain, swelling, or warmth in the leg, shortness of breath, chest pain Heart attack--pain or tightness in the chest, shoulders, arms, or jaw, nausea, shortness of breath, cold or clammy skin, feeling faint or lightheaded Heart failure--shortness of breath, swelling of the ankles, feet, or hands, sudden weight gain, unusual weakness or fatigue Increase in blood pressure Infection--fever, chills, cough, sore throat, wounds that don't heal, pain or trouble when passing urine, general feeling of discomfort or being unwell Infusion reactions--chest pain, shortness of breath or trouble breathing, feeling faint or lightheaded Kidney injury--decrease in the amount of urine, swelling of the ankles, hands, or feet Stomach pain that is severe, does not go away, or gets worse Stroke--sudden numbness or weakness of  the face, arm, or leg, trouble speaking, confusion, trouble walking, loss of balance or coordination, dizziness, severe headache, change in vision Sudden and severe headache, confusion, change in vision, seizures, which may be signs of posterior reversible encephalopathy syndrome (PRES) Side effects that usually do not require medical attention (report to your care team if they continue or are bothersome): Back pain Change in taste Diarrhea Dry skin Increased tears Nosebleed This list may not describe all possible side effects. Call your doctor for medical advice about side effects. You may report side effects to FDA at 1-800-FDA-1088. Where should I keep my medication? This medication is given in a hospital or clinic. It will not be stored at home. NOTE: This sheet is a summary. It may not cover all possible information. If you have questions about this medicine, talk to your doctor, pharmacist, or health care provider.  2023 Elsevier/Gold Standard (2022-03-31 00:00:00)  Leucovorin Injection What is this medication? LEUCOVORIN (loo koe VOR in) prevents side effects from certain medications, such as methotrexate. It works by increasing folate levels. This helps protect healthy cells in your body. It may also be used to treat anemia caused by low levels of folate. It can also be used with fluorouracil, a type of chemotherapy, to treat colorectal cancer. It works by increasing the effects of fluorouracil in the body. This  medicine may be used for other purposes; ask your health care provider or pharmacist if you have questions. What should I tell my care team before I take this medication? They need to know if you have any of these conditions: Anemia from low levels of vitamin B12 in the blood An unusual or allergic reaction to leucovorin, folic acid, other medications, foods, dyes, or preservatives Pregnant or trying to get pregnant Breastfeeding How should I use this medication? This  medication is injected into a vein or a muscle. It is given by your care team in a hospital or clinic setting. Talk to your care team about the use of this medication in children. Special care may be needed. Overdosage: If you think you have taken too much of this medicine contact a poison control center or emergency room at once. NOTE: This medicine is only for you. Do not share this medicine with others. What if I miss a dose? Keep appointments for follow-up doses. It is important not to miss your dose. Call your care team if you are unable to keep an appointment. What may interact with this medication? Capecitabine Fluorouracil Phenobarbital Phenytoin Primidone Trimethoprim;sulfamethoxazole This list may not describe all possible interactions. Give your health care provider a list of all the medicines, herbs, non-prescription drugs, or dietary supplements you use. Also tell them if you smoke, drink alcohol, or use illegal drugs. Some items may interact with your medicine. What should I watch for while using this medication? Your condition will be monitored carefully while you are receiving this medication. This medication may increase the side effects of 5-fluorouracil. Tell your care team if you have diarrhea or mouth sores that do not get better or that get worse. What side effects may I notice from receiving this medication? Side effects that you should report to your care team as soon as possible: Allergic reactions--skin rash, itching, hives, swelling of the face, lips, tongue, or throat This list may not describe all possible side effects. Call your doctor for medical advice about side effects. You may report side effects to FDA at 1-800-FDA-1088. Where should I keep my medication? This medication is given in a hospital or clinic. It will not be stored at home. NOTE: This sheet is a summary. It may not cover all possible information. If you have questions about this medicine, talk to  your doctor, pharmacist, or health care provider.  2023 Elsevier/Gold Standard (2022-05-02 00:00:00)  Fluorouracil Injection What is this medication? FLUOROURACIL (flure oh YOOR a sil) treats some types of cancer. It works by slowing down the growth of cancer cells. This medicine may be used for other purposes; ask your health care provider or pharmacist if you have questions. COMMON BRAND NAME(S): Adrucil What should I tell my care team before I take this medication? They need to know if you have any of these conditions: Blood disorders Dihydropyrimidine dehydrogenase (DPD) deficiency Infection, such as chickenpox, cold sores, herpes Kidney disease Liver disease Poor nutrition Recent or ongoing radiation therapy An unusual or allergic reaction to fluorouracil, other medications, foods, dyes, or preservatives If you or your partner are pregnant or trying to get pregnant Breast-feeding How should I use this medication? This medication is injected into a vein. It is administered by your care team in a hospital or clinic setting. Talk to your care team about the use of this medication in children. Special care may be needed. Overdosage: If you think you have taken too much of this medicine contact a  poison control center or emergency room at once. NOTE: This medicine is only for you. Do not share this medicine with others. What if I miss a dose? Keep appointments for follow-up doses. It is important not to miss your dose. Call your care team if you are unable to keep an appointment. What may interact with this medication? Do not take this medication with any of the following: Live virus vaccines This medication may also interact with the following: Medications that treat or prevent blood clots, such as warfarin, enoxaparin, dalteparin This list may not describe all possible interactions. Give your health care provider a list of all the medicines, herbs, non-prescription drugs, or  dietary supplements you use. Also tell them if you smoke, drink alcohol, or use illegal drugs. Some items may interact with your medicine. What should I watch for while using this medication? Your condition will be monitored carefully while you are receiving this medication. This medication may make you feel generally unwell. This is not uncommon as chemotherapy can affect healthy cells as well as cancer cells. Report any side effects. Continue your course of treatment even though you feel ill unless your care team tells you to stop. In some cases, you may be given additional medications to help with side effects. Follow all directions for their use. This medication may increase your risk of getting an infection. Call your care team for advice if you get a fever, chills, sore throat, or other symptoms of a cold or flu. Do not treat yourself. Try to avoid being around people who are sick. This medication may increase your risk to bruise or bleed. Call your care team if you notice any unusual bleeding. Be careful brushing or flossing your teeth or using a toothpick because you may get an infection or bleed more easily. If you have any dental work done, tell your dentist you are receiving this medication. Avoid taking medications that contain aspirin, acetaminophen, ibuprofen, naproxen, or ketoprofen unless instructed by your care team. These medications may hide a fever. Do not treat diarrhea with over the counter products. Contact your care team if you have diarrhea that lasts more than 2 days or if it is severe and watery. This medication can make you more sensitive to the sun. Keep out of the sun. If you cannot avoid being in the sun, wear protective clothing and sunscreen. Do not use sun lamps, tanning beds, or tanning booths. Talk to your care team if you or your partner wish to become pregnant or think you might be pregnant. This medication can cause serious birth defects if taken during pregnancy and  for 3 months after the last dose. A reliable form of contraception is recommended while taking this medication and for 3 months after the last dose. Talk to your care team about effective forms of contraception. Do not father a child while taking this medication and for 3 months after the last dose. Use a condom while having sex during this time period. Do not breastfeed while taking this medication. This medication may cause infertility. Talk to your care team if you are concerned about your fertility. What side effects may I notice from receiving this medication? Side effects that you should report to your care team as soon as possible: Allergic reactions--skin rash, itching, hives, swelling of the face, lips, tongue, or throat Heart attack--pain or tightness in the chest, shoulders, arms, or jaw, nausea, shortness of breath, cold or clammy skin, feeling faint or lightheaded Heart failure--shortness of  breath, swelling of the ankles, feet, or hands, sudden weight gain, unusual weakness or fatigue Heart rhythm changes--fast or irregular heartbeat, dizziness, feeling faint or lightheaded, chest pain, trouble breathing High ammonia level--unusual weakness or fatigue, confusion, loss of appetite, nausea, vomiting, seizures Infection--fever, chills, cough, sore throat, wounds that don't heal, pain or trouble when passing urine, general feeling of discomfort or being unwell Low red blood cell level--unusual weakness or fatigue, dizziness, headache, trouble breathing Pain, tingling, or numbness in the hands or feet, muscle weakness, change in vision, confusion or trouble speaking, loss of balance or coordination, trouble walking, seizures Redness, swelling, and blistering of the skin over hands and feet Severe or prolonged diarrhea Unusual bruising or bleeding Side effects that usually do not require medical attention (report to your care team if they continue or are bothersome): Dry  skin Headache Increased tears Nausea Pain, redness, or swelling with sores inside the mouth or throat Sensitivity to light Vomiting This list may not describe all possible side effects. Call your doctor for medical advice about side effects. You may report side effects to FDA at 1-800-FDA-1088. Where should I keep my medication? This medication is given in a hospital or clinic. It will not be stored at home. NOTE: This sheet is a summary. It may not cover all possible information. If you have questions about this medicine, talk to your doctor, pharmacist, or health care provider.  2023 Elsevier/Gold Standard (2022-03-28 00:00:00)     BELOW ARE SYMPTOMS THAT SHOULD BE REPORTED IMMEDIATELY: *FEVER GREATER THAN 100.4 F (38 C) OR HIGHER *CHILLS OR SWEATING *NAUSEA AND VOMITING THAT IS NOT CONTROLLED WITH YOUR NAUSEA MEDICATION *UNUSUAL SHORTNESS OF BREATH *UNUSUAL BRUISING OR BLEEDING *URINARY PROBLEMS (pain or burning when urinating, or frequent urination) *BOWEL PROBLEMS (unusual diarrhea, constipation, pain near the anus) TENDERNESS IN MOUTH AND THROAT WITH OR WITHOUT PRESENCE OF ULCERS (sore throat, sores in mouth, or a toothache) UNUSUAL RASH, SWELLING OR PAIN  UNUSUAL VAGINAL DISCHARGE OR ITCHING   Items with * indicate a potential emergency and should be followed up as soon as possible or go to the Emergency Department if any problems should occur.  Please show the CHEMOTHERAPY ALERT CARD or IMMUNOTHERAPY ALERT CARD at check-in to the Emergency Department and triage nurse.  Should you have questions after your visit or need to cancel or reschedule your appointment, please contact [facility] [phone]  and follow the prompts.  Office hours are 8:00 a.m. to 4:30 p.m. Monday - Friday. Please note that voicemails left after 4:00 p.m. may not be returned until the following business day.  We are closed weekends and major holidays. You have access to a nurse  at all times for urgent questions. Please call the main number to the clinic [phone] and follow the prompts.  For any non-urgent questions, you may also contact your provider using MyChart. We now offer e-Visits for anyone 58 and older to request care online for non-urgent symptoms. For details visit [email or web address].   Also download the MyChart app! Go to the app store, search "MyChart", open the app, select [facility], and log in with your MyChart username and password.

## 2023-01-30 NOTE — Progress Notes (Signed)
Patient has been examined by Dr. Katragadda, and vital signs and labs have been reviewed. ANC, Creatinine, LFTs, hemoglobin, and platelets are within treatment parameters per M.D. - pt may proceed with treatment.  Primary RN and pharmacy notified.  

## 2023-01-30 NOTE — Patient Instructions (Addendum)
[place] at [facility] Discharge Instructions   You were seen and examined today by Dr. Delton Coombes.  He reviewed the results of your lab work which are  He reviewed the results of your CT scan which are stable. The cancer has not grown, and there are no new areas of cancer identified on the CT scan.  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   We will proceed with your treatment today.   Return as scheduled.    Thank you for choosing [facility] at [facility] to provide your oncology and hematology care.  To afford each patient quality time with our provider, please arrive at least 15 minutes before your scheduled appointment time.   If you have a lab appointment with the [facility] please come in thru the Main Entrance and check in at the main information desk.  You need to re-schedule your appointment should you arrive 10 or more minutes late.  We strive to give you quality time with our providers, and arriving late affects you and other patients whose appointments are after yours.  Also, if you no show three or more times for appointments you may be dismissed from the clinic at the providers discretion.     Again, thank you for choosing [facility].  Our hope is that these requests will decrease the amount of time that you wait before being seen by our physicians.       _____________________________________________________________  Should you have questions after your visit to [facility], please contact our office at [phone] and follow the prompts.  Our office hours are 8:00 a.m. and 4:30 p.m. Monday - Friday.  Please note that voicemails left after 4:00 p.m. may not be returned until the following business day.  We are closed weekends and major holidays.  You do have access to a nurse 24-7, just call the main number to the  clinic [phone] and do not press any options, hold on the line and a nurse will answer the phone.    For prescription refill requests, have your pharmacy contact our office and allow 72 hours.    Due to Covid, you will need to wear a mask upon entering the hospital. If you do not have a mask, a mask will be given to you at the Main Entrance upon arrival. For doctor visits, patients may have 1 support person age 21 or older with them. For treatment visits, patients can not have anyone with them due to social distancing guidelines and our immunocompromised population.

## 2023-01-30 NOTE — Progress Notes (Signed)
Pt presents today for Bevacizumab,Leucovorin, and 5FU per provider's order. Vital signs and labs WNL for treatment. Pt's potassium is 3.2 today, pt will receive 40 mEq potassium chloride p.o x 1 dose. Okay to proceed with treatment today per Dr.K.  Treatment given today per MD orders. Tolerated infusion without adverse affects. Vital signs stable. No complaints at this time. Discharged from clinic ambulatory in stable condition. Alert and oriented x 3. F/U with [facility] as scheduled. 5FU ambulatory pump infusing.

## 2023-01-30 NOTE — Progress Notes (Signed)
Evan [name],  [id number]   CLINIC:  Medical Oncology/Hematology  PCP:  Evan Lemons, PA [address] 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  HER2 negative.  TMB low.  MSI-stable.  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  Other targetable mutations negative.  CURRENT THERAPY: K-ras G12 D mutation.  HER2 negative.  TMB low.  MSI-stable.  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  Other targetable mutations negative.  BRIEF ONCOLOGIC HISTORY:  Oncology History  Cecal cancer ([facility])  10/07/2019 Initial Diagnosis   Cecal cancer ([facility])   10/07/2019 Cancer Staging   Staging form: Colon and Rectum, AJCC 8th Edition - Clinical stage from 10/07/2019: Stage IIIB (cT3, cN1, cM0) - Signed by Evan Jack, Evan [name] on 10/07/2019   02/22/2022 - 08/08/2022 Chemotherapy   Patient is on Treatment Plan : COLORECTAL FOLFIRI / BEVACIZUMAB Q14D     02/22/2022 -  Chemotherapy   Patient is on Treatment Plan : COLORECTAL FOLFIRI + Bevacizumab q14d       CANCER STAGING:  Cancer Staging  Cecal cancer ([facility]) Staging form: Colon and Rectum, AJCC 8th Edition - Clinical stage from 10/07/2019: Stage IIIB (cT3, cN1, cM0) - Signed by Evan Jack, Evan [name] on 10/07/2019 - Pathologic stage from 01/23/2022: Stage IVB (rpTX, pN0, pM1b) - Unsigned   INTERVAL HISTORY:  [name], a 67 y.o. 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  He is receiving maintenance 5-FU and bevacizumab.  He is functioning well with energy levels of 100%.  He is continuing to Evan [name] full-time job.  Chest pain from metastatic disease is well-controlled.  Occasionally he requires 2 tablets of oxycodone at a time and keeps running out of them 1 week prior to his next prescription.  Chronic constipation is  well-managed with lactulose.  Denies any bleeding per rectum or melena.  He rarely sees blood on the tissue paper if he is severely constipated.  REVIEW OF SYSTEMS:  Review of Systems  Constitutional:  Negative for appetite change and unexpected weight change.  Cardiovascular:  Positive for chest pain (Sternal pain).  Gastrointestinal:  Positive for constipation (chronic).  Neurological:  Negative for headaches.  All other systems reviewed and are negative.   PAST MEDICAL/SURGICAL HISTORY:  Past Medical History:  Diagnosis Date   Arthritis    Colon cancer ([facility])    colon   Hepatitis C    Hypertension    Port-A-Cath in place 02/15/2022   Past Surgical History:  Procedure Laterality Date   COLON SURGERY     PORTACATH PLACEMENT Right 02/08/2022   Procedure: INSERTION PORT-A-CATH- RIJ;  Surgeon: Evan Aus, Evan [name];  Location: [facility];  Service: General;  Laterality: Right;   REPLACEMENT TOTAL KNEE Left    SHOULDER ARTHROSCOPY Bilateral    TOTAL HIP ARTHROPLASTY Right 11/09/2020   Procedure: TOTAL HIP ARTHROPLASTY ANTERIOR APPROACH;  Surgeon: Evan Butters, Evan [name];  Location: [facility];  Service: Orthopedics;  Laterality: Right;   WRIST SURGERY Left     SOCIAL HISTORY:  Social History   Socioeconomic History   Marital status: Married    Spouse name: Not on file   Number of children: 7   Years of education: Not on file   Highest education level: Not on file  Occupational History   Occupation: employed  Tobacco Use   Smoking status: Former    Packs/day: 1.50    Years: 20.00    Total pack years: 30.00    Types: Cigarettes    Quit date: 11/19/2005    Years since quitting: 17.2   Smokeless tobacco: Never  Vaping Use   Vaping Use: Never used  Substance and Sexual Activity   Alcohol use: Not Currently   Drug use: Never   Sexual activity: Yes  Other Topics Concern   Not on file  Social History Narrative   ** Merged History Encounter **       Separated from wife  11/2021   Social Determinants of Health   Financial Resource Strain: Low Risk  (10/07/2019)   Overall Financial Resource Strain (CARDIA)    Difficulty of Paying Living Expenses: Not very hard  Food Insecurity: No Food Insecurity (10/07/2019)   Hunger Vital Sign    Worried About Running Out of Food in the Last Year: Never true    Ran Out of Food in the Last Year: Never true  Transportation Needs: No Transportation Needs (10/07/2019)   PRAPARE - Hydrologist (Medical): No    Lack of Transportation (Non-Medical): No  Physical Activity: Inactive (10/07/2019)   Exercise Vital Sign    Days of Exercise per Week: 0 days    Minutes of Exercise per Session: 0 min  Stress: No Stress Concern Present (10/07/2019)   [place]    Feeling of Stress : Not at all  Social Connections: Moderately Integrated (10/07/2019)   Social Connection and Isolation Panel [NHANES]    Frequency of Communication with Friends and Family: Once a week    Frequency of Social Gatherings with Friends and Family: Once a week    Attends Religious Services: More than 4 times per year    Active Member of [employer] or Organizations: Yes    Attends Music therapist: More than 4 times per year    Marital Status: Married  Human resources officer Violence: Not At Risk (10/07/2019)   Humiliation, Afraid, Rape, and Kick questionnaire    Fear of Current or Ex-Partner: No    Emotionally Abused: No    Physically Abused: No    Sexually Abused: No    FAMILY HISTORY:  Family History  Problem Relation Age of Onset   Heart disease Mother    Dementia Father    Heart disease Sister    Cancer Brother    Hypertension Brother    Hypertension Brother    Healthy Son    Healthy Son    Healthy Son    Healthy Son    Healthy Daughter    Healthy Daughter    Healthy Daughter     CURRENT MEDICATIONS:  Current Outpatient Medications   Medication Sig Dispense Refill   aluminum-magnesium hydroxide-simethicone (MAALOX) 833-825-05 MG/5ML SUSP Take 30 mLs by mouth 4 (four) times daily -  before meals and at bedtime. 1680 mL 2   amLODipine (NORVASC) 10 MG tablet Take 1 tablet (10 mg total) by mouth daily. 90 tablet 3   Bevacizumab (AVASTIN IV) Inject into the vein every 14 (fourteen) days. *start date TBD     ENULOSE 10 GM/15ML SOLN Take by mouth.     fluorouracil CALGB 39767 2,400 mg/m2 in sodium chloride 0.9 % 150 mL Inject 2,400 mg/m2 into the vein over 48 hr.     FLUOROURACIL IV  Inject into the vein every 14 (fourteen) days.     Lactulose 20 GM/30ML SOLN Take 15 mLs (10 g total) by mouth at bedtime. Take 15 ml at bedtime every night to assist with regular bowel movements.  Titrate down if having multiple bowel movements.  If a bowel movement has not occurred in 3 to 4 days or longer, then take 15 ml every 3 hours until a bowel movent has occurred. 450 mL 1   LEUCOVORIN CALCIUM IV Inject into the vein every 14 (fourteen) days.     megestrol (MEGACE) 400 MG/10ML suspension Take 10 mLs (400 mg total) by mouth 2 (two) times daily. 480 mL 3   Oxycodone HCl 10 MG TABS TAKE ONE TABLET BY MOUTH EVERY 6 HOURS AS NEEDED 120 tablet 0   potassium chloride SA (KLOR-CON M) 20 MEQ tablet Take 1 tablet (20 mEq total) by mouth 2 (two) times daily. 30 tablet 3   traZODone (DESYREL) 50 MG tablet Take 1 tablet (50 mg total) by mouth at bedtime. 30 tablet 3   vitamin B-12 (CYANOCOBALAMIN) 1000 MCG tablet Take 1 tablet (1,000 mcg total) by mouth daily. 30 tablet 6   No current facility-administered medications for this visit.    ALLERGIES:  No Known Allergies  PHYSICAL EXAM:  Performance status (ECOG): 0 - Asymptomatic  There were no vitals filed for this visit.  Wt Readings from Last 3 Encounters:  01/30/23 185 lb 3.2 oz (84 kg)  01/16/23 185 lb 9.6 oz (84.2 kg)  01/02/23 185 lb (83.9 kg)   Physical Exam Vitals reviewed.   Constitutional:      Appearance: Normal appearance.  Cardiovascular:     Rate and Rhythm: Normal rate and regular rhythm.     Pulses: Normal pulses.     Heart sounds: Normal heart sounds.  Pulmonary:     Effort: Pulmonary effort is normal.     Breath sounds: Normal breath sounds.  Neurological:     General: No focal deficit present.     Mental Status: He is alert and oriented to person, place, and time.  Psychiatric:        Mood and Affect: Mood normal.        Behavior: Behavior normal.     LABORATORY DATA:  I have reviewed the labs as listed.     Latest Ref Rng & Units 01/16/2023    8:59 AM 01/02/2023   10:08 AM 12/19/2022    9:38 AM  CBC  WBC 4.0 - 10.5 K/uL 5.9  5.4  5.1   Hemoglobin 13.0 - 17.0 g/dL 13.1  12.0  12.3   Hematocrit 39.0 - 52.0 % 39.7  37.0  38.1   Platelets 150 - 400 K/uL 137  146  139       Latest Ref Rng & Units 01/16/2023    8:59 AM 01/02/2023   10:08 AM 12/19/2022    9:38 AM  CMP  Glucose 70 - 99 mg/dL 100  121  99   BUN 8 - 23 mg/dL 10  11  11    Creatinine 0.61 - 1.24 mg/dL 1.02  0.99  0.98   Sodium 135 - 145 mmol/L 136  136  138   Potassium 3.5 - 5.1 mmol/L 3.8  3.6  3.8   Chloride 98 - 111 mmol/L 101  101  104   CO2 22 - 32 mmol/L 26  25  25    Calcium 8.9 - 10.3 mg/dL 9.4  8.8  9.2   Total  Protein 6.5 - 8.1 g/dL 7.9  7.3  7.2   Total Bilirubin 0.3 - 1.2 mg/dL 1.0  0.7  0.8   Alkaline Phos 38 - 126 U/L 40  40  44   AST 15 - 41 U/L 22  24  19    ALT 0 - 44 U/L 10  10  9      DIAGNOSTIC IMAGING:  I have independently reviewed the scans and discussed with the patient. CT CHEST ABDOMEN PELVIS W CONTRAST  Result Date: 01/22/2023 CLINICAL DATA:  Metastatic colon cancer to the lungs. Left adrenal nodule * Tracking Code: BO * EXAM: CT CHEST, ABDOMEN, AND PELVIS WITH CONTRAST TECHNIQUE: Multidetector CT imaging of the chest, abdomen and pelvis was performed following the standard protocol during bolus administration of intravenous contrast. RADIATION DOSE  REDUCTION: This exam was performed according to the departmental dose-optimization program which includes automated exposure control, adjustment of the mA and/or kV according to patient size and/or use of iterative reconstruction technique. CONTRAST:  172mL OMNIPAQUE IOHEXOL 300 MG/ML  SOLN COMPARISON:  11/01/2022 and older CT FINDINGS: CT CHEST FINDINGS Cardiovascular: Right upper chest port. Tip of the port extends only as far as the SVC brachiocephalic junction region, unchanged from prior. Heart is nonenlarged. Trace pericardial fluid. Thoracic aorta is normal course and caliber with mild scattered partially calcified plaque. Mediastinum/Nodes: Small thyroid gland. Normal caliber thoracic esophagus. No specific abnormal lymph node enlargement identified in the axillary regions. There are abnormal nodes identified once again in the hila bilaterally and mediastinum. Specific nodes will be followed for continuity. This includes a lymph node left of the mediastinum which had a short axis diameter on the prior of 10 mm and today 9 mm on series 2, image 33. Subcarinal node which previously had short axis dimension of 15 mm, today 1.4 x 2.1 cm, similar to slightly smaller. Precarinal node right of midline today has dimensions of 2.3 x 1.8 cm on image 28 of series 2 and previously 2.8 x 1.9 cm. No new lymph node enlargement. Lungs/Pleura: No pneumothorax, consolidation or effusion. Multiple ill-defined spiculated lung nodules are once again identified. At least 10 nodules are seen in each lung. Extent and distribution is similar to the previous examinations. Specific lesions will be followed for continuity. The left upper lobe lesion which previously measured 2.7 x 1.5 cm, today on series 3, image 62 measures 2.4 x 1.4 cm. Right upper lobe nodular area which previously measured 3.1 x 1.9 cm, today when measured in the same fashion on series 3, image 59 measures 3.2 x 1.7 cm. Right lower lobe nodule which previously  measured 2.4 x 1.3 cm, today on series 3, image 102 measures 2.4 by 1.4 cm. Nodule right lower lobe medial abutting the pleura which previously had a length of 2.3 cm, today measures 2.3 x 1.1 cm image 117, series 3. Dominant lesion medially in the left lower lobe today measures 6.6 x 2.8 cm. Series 3, image 89. This does not involve the hilum. On the prior this measured 6.5 x 2.8 cm. Again overall no significant interval change. Musculoskeletal: Mild curvature and degenerative changes along the spine. CT ABDOMEN PELVIS FINDINGS Hepatobiliary: No space-occupying liver lesion. Mild fatty liver infiltration. Gallbladder is nondilated. Patent portal vein. Pancreas: Unremarkable. No pancreatic ductal dilatation or surrounding inflammatory changes. Spleen: Normal in size without focal abnormality. Adrenals/Urinary Tract: Stable left adrenal nodule measuring 14 x 10 mm and previously 13 mm in maximal dimension. This is seen today on series 2, image  63. Right adrenal gland is preserved. No space-occupying renal mass or collecting system dilatation. Stomach/Bowel: Large bowel is of normal course and caliber. Scattered colonic stool. Surgical changes from partial right hemicolectomy with primary anastomosis. No adjacent mass lesion. The stomach and small bowel are nondilated. Vascular/Lymphatic: Normal caliber aorta and IVC with atherosclerotic changes. Few varices suggested along the lower esophagus, nonspecific. Again patent portal vein and splenic vein. No specific abnormal lymph node enlargement identified in the abdomen and pelvis. Reproductive: Prostate is unremarkable. Other: No ascites. No abdominal or pelvic wall hernia. Mild anasarca. Musculoskeletal: Streak artifact related to the patient's right hip arthroplasty obscuring the surrounding bony and soft tissues. Curvature and degenerative changes along the spine. IMPRESSION: 1. Multiple ill-defined spiculated lung nodules are once again identified in each lung.  Extent and distribution is similar to the previous examinations. Numerous abnormal hilar and mediastinal nodes. Similar to slightly smaller as well. 2. Stable left adrenal nodule.  Attention on follow-up. 3. No new or progressive findings in the chest, abdomen or pelvis. 4. Aortic atherosclerosis. 5. Mild fatty liver infiltration. 6. Few varices suggested along the lower esophagus, nonspecific. Aortic Atherosclerosis (ICD10-I70.0). Electronically Signed   By: Jill Side M.D.   On: 01/22/2023 11:59     ASSESSMENT:  Left lung mass with multiple lung nodules: - Presentation with dry cough for 6 months. - 30 pound weight loss in the last couple of years, weight stable over the last 6 months. - CT chest with contrast on 12/16/2021 showed bulky left hilar mass measuring 8.1 x 7.5 cm.  Numerous bilateral lung nodules of varying sizes.  Left adrenal nodule measuring 2.8 x 2.2 cm.  Mediastinal and bilateral hilar adenopathy with the largest pretracheal node measuring 4.1 x 3.1 cm. - MRI of the brain from 01/04/2022 which was negative for metastatic disease. - CTAP from 01/03/2022 which showed isolated left adrenal metastasis with no other evidence of metastatic disease in the abdomen or pelvis. - Pathology of left lung biopsy which shows adenocarcinoma with necrosis.  CK20 positive and CDX2 positive but negative for CK7 indicating colonic primary. 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  HER2 negative.  TMB low.  MSI-stable.  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  Other targetable mutations negative. - FOLFIRI started on 02/22/2022, bevacizumab added with cycle 4  - CT CAP (05/17/2022): Mediastinal and hilar lymph nodes have decreased in size.  Largest perihilar left lower lobe lung mass has decreased in size.  Right upper lobe mass slightly increased in size.  Other nodules are stable.  Left adrenal mass decreased to 1.3 cm from 2.8 cm. - CT scan showed mixed response as it was compared to CT from 12/16/2021.  He did not  start chemotherapy until 02/22/2022.  Social/family history: - Lives by himself.  He paints houses for living. - He quit smoking 12 years back and started back again 1 and half year ago and smoked half pack per day.  He quit again about 1 week ago. - Father had cancer, type unknown to the patient.  Brother died of brain tumor.  3.  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  Margins negative.  No lymphovascular or perineural invasion. - Received 3 cycles of XELOX followed by Xeloda for total of 6 months.  Oxaliplatin discontinued during cycle 4 secondary to transaminitis, elevated bilirubin and thrombocytopenia.  However he was also treated for hep C with Harvoni after that.  PLAN:  Metastatic colon cancer to the lungs and left adrenal gland: - CEA on 01/16/2023 was 44.5, down from 46 5.7 on 12/19/2022. - CT CAP (01/22/2023): Multiple lung nodules stable in size and number.  Hilar and mediastinal lymph nodes are stable to slightly smaller.  Stable left adrenal nodule.  No new findings in the chest, abdomen or pelvis.  Mild fatty infiltration.  Also seen were few varices suggested along the lower esophagus, nonspecific. - UA was negative for protein.  CBC was grossly normal with normal LFTs and mildly elevated total bilirubin 1.4. - He will continue with 5-FU and bevacizumab maintenance. - I have recommended GI evaluation for EGD for varices.  If there are varices, I would recommend banding as he is receiving bevacizumab. - RTC 4 weeks for follow-up.  2.  Lower rib/epigastric pain: - He is taking oxycodone 10 mg every 6 hours as needed but occasionally needs 2 pills at a time.  He is running out of his pills 1 week prior. - I will increase his oxycodone to 10 mg 1 tablet every 4 hours as needed.  New prescription will be sent.  3.  Difficulty falling asleep: - Continue trazodone as needed.  4.  Hypertension: - Continue  amlodipine 10 mg daily.  Blood pressure today is better controlled at 140/80.   Orders placed this encounter:  No orders of the defined types were placed in this encounter.    Evan Jack, Evan [name] [place] [phone]

## 2023-02-01 ENCOUNTER — Inpatient Hospital Stay: Payer: Medicare Other

## 2023-02-01 VITALS — BP 128/81 | HR 66 | Temp 97.0°F | Resp 18

## 2023-02-01 DIAGNOSIS — C7972 Secondary malignant neoplasm of left adrenal gland: Secondary | ICD-10-CM | POA: Diagnosis not present

## 2023-02-01 DIAGNOSIS — Z5112 Encounter for antineoplastic immunotherapy: Secondary | ICD-10-CM | POA: Diagnosis not present

## 2023-02-01 DIAGNOSIS — Z79899 Other long term (current) drug therapy: Secondary | ICD-10-CM | POA: Diagnosis not present

## 2023-02-01 DIAGNOSIS — C7801 Secondary malignant neoplasm of right lung: Secondary | ICD-10-CM | POA: Diagnosis not present

## 2023-02-01 DIAGNOSIS — Z5111 Encounter for antineoplastic chemotherapy: Secondary | ICD-10-CM | POA: Diagnosis not present

## 2023-02-01 DIAGNOSIS — C180 Malignant neoplasm of cecum: Secondary | ICD-10-CM | POA: Diagnosis not present

## 2023-02-01 DIAGNOSIS — Z95828 Presence of other vascular implants and grafts: Secondary | ICD-10-CM

## 2023-02-01 DIAGNOSIS — Z808 Family history of malignant neoplasm of other organs or systems: Secondary | ICD-10-CM | POA: Diagnosis not present

## 2023-02-01 DIAGNOSIS — I10 Essential (primary) hypertension: Secondary | ICD-10-CM | POA: Diagnosis not present

## 2023-02-01 DIAGNOSIS — C7802 Secondary malignant neoplasm of left lung: Secondary | ICD-10-CM | POA: Diagnosis not present

## 2023-02-01 DIAGNOSIS — G893 Neoplasm related pain (acute) (chronic): Secondary | ICD-10-CM | POA: Diagnosis not present

## 2023-02-01 DIAGNOSIS — K5909 Other constipation: Secondary | ICD-10-CM | POA: Diagnosis not present

## 2023-02-01 DIAGNOSIS — Z87891 Personal history of nicotine dependence: Secondary | ICD-10-CM | POA: Diagnosis not present

## 2023-02-01 MED ORDER — SODIUM CHLORIDE 0.9% FLUSH
10.0000 mL | INTRAVENOUS | Status: DC | PRN
  Administered 2023-02-01: 10 mL

## 2023-02-01 MED ORDER — HEPARIN SOD (PORK) LOCK FLUSH 100 UNIT/ML IV SOLN
500.0000 [IU] | Freq: Once | INTRAVENOUS | Status: AC | PRN
  Administered 2023-02-01: 500 [IU]

## 2023-02-01 NOTE — Progress Notes (Signed)
Evan [name] presents to have home infusion pump d/c'd and for port-a-cath deaccess with flush.  Portacath located right chest wall accessed with  H 20 needle.  Good blood return present. Portacath flushed with NS and 500U/83ml Heparin, and needle removed intact.  Procedure tolerated well and without incident.    Vitals stable and discharged home from clinic ambulatory. Follow up as scheduled.

## 2023-02-01 NOTE — Patient Instructions (Signed)
[facility]  Discharge Instructions: Thank you for choosing [facility] to provide your oncology and hematology care.  If you have a lab appointment with the [facility], please come in thru the Main Entrance and check in at the main information desk.  Wear comfortable clothing and clothing appropriate for easy access to any Portacath or PICC line.   We strive to give you quality time with your provider. You may need to reschedule your appointment if you arrive late (15 or more minutes).  Arriving late affects you and other patients whose appointments are after yours.  Also, if you miss three or more appointments without notifying the office, you may be dismissed from the clinic at the provider's discretion.      For prescription refill requests, have your pharmacy contact our office and allow 72 hours for refills to be completed.    Today you had your ambulatory pump disconnected.    To help prevent nausea and vomiting after your treatment, we encourage you to take your nausea medication as directed.  BELOW ARE SYMPTOMS THAT SHOULD BE REPORTED IMMEDIATELY: *FEVER GREATER THAN 100.4 F (38 C) OR HIGHER *CHILLS OR SWEATING *NAUSEA AND VOMITING THAT IS NOT CONTROLLED WITH YOUR NAUSEA MEDICATION *UNUSUAL SHORTNESS OF BREATH *UNUSUAL BRUISING OR BLEEDING *URINARY PROBLEMS (pain or burning when urinating, or frequent urination) *BOWEL PROBLEMS (unusual diarrhea, constipation, pain near the anus) TENDERNESS IN MOUTH AND THROAT WITH OR WITHOUT PRESENCE OF ULCERS (sore throat, sores in mouth, or a toothache) UNUSUAL RASH, SWELLING OR PAIN  UNUSUAL VAGINAL DISCHARGE OR ITCHING   Items with * indicate a potential emergency and should be followed up as soon as possible or go to the Emergency Department if any problems should occur.  Please show the CHEMOTHERAPY ALERT CARD or IMMUNOTHERAPY ALERT CARD at check-in to the Emergency Department and triage  nurse.  Should you have questions after your visit or need to cancel or reschedule your appointment, please contact [facility] [phone]  and follow the prompts.  Office hours are 8:00 a.m. to 4:30 p.m. Monday - Friday. Please note that voicemails left after 4:00 p.m. may not be returned until the following business day.  We are closed weekends and major holidays. You have access to a nurse at all times for urgent questions. Please call the main number to the clinic [phone] and follow the prompts.  For any non-urgent questions, you may also contact your provider using MyChart. We now offer e-Visits for anyone 50 and older to request care online for non-urgent symptoms. For details visit [email or web address].   Also download the MyChart app! Go to the app store, search "MyChart", open the app, select [facility], and log in with your MyChart username and password.

## 2023-02-13 ENCOUNTER — Inpatient Hospital Stay: Payer: Medicare Other | Attending: Hematology

## 2023-02-13 ENCOUNTER — Inpatient Hospital Stay: Payer: Medicare Other

## 2023-02-13 ENCOUNTER — Ambulatory Visit: Payer: Medicare Other | Admitting: Hematology

## 2023-02-13 VITALS — BP 142/89 | HR 63 | Temp 97.7°F | Resp 16

## 2023-02-13 DIAGNOSIS — I10 Essential (primary) hypertension: Secondary | ICD-10-CM | POA: Diagnosis not present

## 2023-02-13 DIAGNOSIS — Z5112 Encounter for antineoplastic immunotherapy: Secondary | ICD-10-CM | POA: Diagnosis not present

## 2023-02-13 DIAGNOSIS — C7802 Secondary malignant neoplasm of left lung: Secondary | ICD-10-CM | POA: Diagnosis not present

## 2023-02-13 DIAGNOSIS — C180 Malignant neoplasm of cecum: Secondary | ICD-10-CM | POA: Diagnosis not present

## 2023-02-13 DIAGNOSIS — C189 Malignant neoplasm of colon, unspecified: Secondary | ICD-10-CM | POA: Diagnosis not present

## 2023-02-13 DIAGNOSIS — Z79899 Other long term (current) drug therapy: Secondary | ICD-10-CM | POA: Insufficient documentation

## 2023-02-13 DIAGNOSIS — Z87891 Personal history of nicotine dependence: Secondary | ICD-10-CM | POA: Diagnosis not present

## 2023-02-13 DIAGNOSIS — Z5111 Encounter for antineoplastic chemotherapy: Secondary | ICD-10-CM | POA: Insufficient documentation

## 2023-02-13 DIAGNOSIS — Z95828 Presence of other vascular implants and grafts: Secondary | ICD-10-CM

## 2023-02-13 DIAGNOSIS — C7801 Secondary malignant neoplasm of right lung: Secondary | ICD-10-CM | POA: Diagnosis not present

## 2023-02-13 DIAGNOSIS — C7972 Secondary malignant neoplasm of left adrenal gland: Secondary | ICD-10-CM | POA: Insufficient documentation

## 2023-02-13 LAB — CBC WITH DIFFERENTIAL/PLATELET
Abs Immature Granulocytes: 0.04 10*3/uL (ref 0.00–0.07)
Basophils Absolute: 0.1 10*3/uL (ref 0.0–0.1)
Basophils Relative: 1 %
Eosinophils Absolute: 0.1 10*3/uL (ref 0.0–0.5)
Eosinophils Relative: 3 %
HCT: 36.7 % — ABNORMAL LOW (ref 39.0–52.0)
Hemoglobin: 12.2 g/dL — ABNORMAL LOW (ref 13.0–17.0)
Immature Granulocytes: 1 %
Lymphocytes Relative: 44 %
Lymphs Abs: 2 10*3/uL (ref 0.7–4.0)
MCH: 32.1 pg (ref 26.0–34.0)
MCHC: 33.2 g/dL (ref 30.0–36.0)
MCV: 96.6 fL (ref 80.0–100.0)
Monocytes Absolute: 0.6 10*3/uL (ref 0.1–1.0)
Monocytes Relative: 14 %
Neutro Abs: 1.6 10*3/uL — ABNORMAL LOW (ref 1.7–7.7)
Neutrophils Relative %: 37 %
Platelets: 144 10*3/uL — ABNORMAL LOW (ref 150–400)
RBC: 3.8 MIL/uL — ABNORMAL LOW (ref 4.22–5.81)
RDW: 15.9 % — ABNORMAL HIGH (ref 11.5–15.5)
WBC: 4.4 10*3/uL (ref 4.0–10.5)
nRBC: 0 % (ref 0.0–0.2)

## 2023-02-13 LAB — COMPREHENSIVE METABOLIC PANEL
ALT: 8 U/L (ref 0–44)
AST: 21 U/L (ref 15–41)
Albumin: 3.9 g/dL (ref 3.5–5.0)
Alkaline Phosphatase: 40 U/L (ref 38–126)
Anion gap: 8 (ref 5–15)
BUN: 11 mg/dL (ref 8–23)
CO2: 26 mmol/L (ref 22–32)
Calcium: 9.3 mg/dL (ref 8.9–10.3)
Chloride: 103 mmol/L (ref 98–111)
Creatinine, Ser: 1.01 mg/dL (ref 0.61–1.24)
GFR, Estimated: >60 mL/min (ref >60)
Glucose, Bld: 106 mg/dL — ABNORMAL HIGH (ref 70–99)
Potassium: 3.5 mmol/L (ref 3.5–5.1)
Sodium: 137 mmol/L (ref 135–145)
Total Bilirubin: 0.8 mg/dL (ref 0.3–1.2)
Total Protein: 7.3 g/dL (ref 6.5–8.1)

## 2023-02-13 LAB — URINALYSIS, DIPSTICK ONLY
Bilirubin Urine: NEGATIVE
Glucose, UA: NEGATIVE mg/dL
Hgb urine dipstick: NEGATIVE
Ketones, ur: NEGATIVE mg/dL
Leukocytes,Ua: NEGATIVE
Nitrite: NEGATIVE
Protein, ur: NEGATIVE mg/dL
Specific Gravity, Urine: 1.019 (ref 1.005–1.030)
pH: 5 (ref 5.0–8.0)

## 2023-02-13 LAB — MAGNESIUM: Magnesium: 1.9 mg/dL (ref 1.7–2.4)

## 2023-02-13 MED ORDER — SODIUM CHLORIDE 0.9 % IV SOLN
10.0000 mg | Freq: Once | INTRAVENOUS | Status: AC
  Administered 2023-02-13: 10 mg via INTRAVENOUS
  Filled 2023-02-13: qty 10

## 2023-02-13 MED ORDER — SODIUM CHLORIDE 0.9 % IV SOLN: Freq: Once | INTRAVENOUS | Status: AC

## 2023-02-13 MED ORDER — SODIUM CHLORIDE 0.9 % IV SOLN
2400.0000 mg/m2 | INTRAVENOUS | Status: DC
  Administered 2023-02-13: 5000 mg via INTRAVENOUS
  Filled 2023-02-13: qty 100

## 2023-02-13 MED ORDER — SODIUM CHLORIDE 0.9 % IV SOLN
400.0000 mg/m2 | Freq: Once | INTRAVENOUS | Status: AC
  Administered 2023-02-13: 780 mg via INTRAVENOUS
  Filled 2023-02-13: qty 39

## 2023-02-13 MED ORDER — PALONOSETRON HCL INJECTION 0.25 MG/5ML
0.2500 mg | Freq: Once | INTRAVENOUS | Status: AC
  Administered 2023-02-13: 0.25 mg via INTRAVENOUS
  Filled 2023-02-13: qty 5

## 2023-02-13 MED ORDER — FLUOROURACIL CHEMO INJECTION 2.5 GM/50ML
400.0000 mg/m2 | Freq: Once | INTRAVENOUS | Status: AC
  Administered 2023-02-13: 800 mg via INTRAVENOUS
  Filled 2023-02-13: qty 16

## 2023-02-13 MED ORDER — SODIUM CHLORIDE 0.9 % IV SOLN
5.0000 mg/kg | Freq: Once | INTRAVENOUS | Status: AC
  Administered 2023-02-13: 400 mg via INTRAVENOUS
  Filled 2023-02-13: qty 16

## 2023-02-13 NOTE — Patient Instructions (Signed)
[facility]  Discharge Instructions: Thank you for choosing [facility] to provide your oncology and hematology care.  If you have a lab appointment with the [facility], please come in thru the Main Entrance and check in at the main information desk.  Wear comfortable clothing and clothing appropriate for easy access to any Portacath or PICC line.   We strive to give you quality time with your provider. You may need to reschedule your appointment if you arrive late (15 or more minutes).  Arriving late affects you and other patients whose appointments are after yours.  Also, if you miss three or more appointments without notifying the office, you may be dismissed from the clinic at the provider's discretion.      For prescription refill requests, have your pharmacy contact our office and allow 72 hours for refills to be completed.    Today you received the following chemotherapy and/or immunotherapy agents Veggzelma, Leucovorin, and 5FU   To help prevent nausea and vomiting after your treatment, we encourage you to take your nausea medication as directed.  BELOW ARE SYMPTOMS THAT SHOULD BE REPORTED IMMEDIATELY: *FEVER GREATER THAN 100.4 F (38 C) OR HIGHER *CHILLS OR SWEATING *NAUSEA AND VOMITING THAT IS NOT CONTROLLED WITH YOUR NAUSEA MEDICATION *UNUSUAL SHORTNESS OF BREATH *UNUSUAL BRUISING OR BLEEDING *URINARY PROBLEMS (pain or burning when urinating, or frequent urination) *BOWEL PROBLEMS (unusual diarrhea, constipation, pain near the anus) TENDERNESS IN MOUTH AND THROAT WITH OR WITHOUT PRESENCE OF ULCERS (sore throat, sores in mouth, or a toothache) UNUSUAL RASH, SWELLING OR PAIN  UNUSUAL VAGINAL DISCHARGE OR ITCHING   Items with * indicate a potential emergency and should be followed up as soon as possible or go to the Emergency Department if any problems should occur.  Please show the CHEMOTHERAPY ALERT CARD or IMMUNOTHERAPY ALERT CARD at  check-in to the Emergency Department and triage nurse.  Should you have questions after your visit or need to cancel or reschedule your appointment, please contact [facility] [phone]  and follow the prompts.  Office hours are 8:00 a.m. to 4:30 p.m. Monday - Friday. Please note that voicemails left after 4:00 p.m. may not be returned until the following business day.  We are closed weekends and major holidays. You have access to a nurse at all times for urgent questions. Please call the main number to the clinic [phone] and follow the prompts.  For any non-urgent questions, you may also contact your provider using MyChart. We now offer e-Visits for anyone 50 and older to request care online for non-urgent symptoms. For details visit [email or web address].   Also download the MyChart app! Go to the app store, search "MyChart", open the app, select [facility], and log in with your MyChart username and password.

## 2023-02-13 NOTE — Progress Notes (Signed)
Pt presents today for Vegzelma, Leucovorin, and 5FU per provider's order. Vital signs and other labs WNL for treatment today. Okay to proceed with treatment.   Treatment given today per MD orders. Tolerated infusion without adverse affects. Vital signs stable. No complaints at this time. Discharged from clinic ambulatory in stable condition. Alert and oriented x 3. F/U with [facility] as scheduled. 5FU ambulatory pump infusing.

## 2023-02-15 ENCOUNTER — Inpatient Hospital Stay: Payer: Medicare Other

## 2023-02-15 VITALS — BP 108/73 | HR 106 | Temp 97.6°F | Resp 18

## 2023-02-15 DIAGNOSIS — C7802 Secondary malignant neoplasm of left lung: Secondary | ICD-10-CM | POA: Diagnosis not present

## 2023-02-15 DIAGNOSIS — C7972 Secondary malignant neoplasm of left adrenal gland: Secondary | ICD-10-CM | POA: Diagnosis not present

## 2023-02-15 DIAGNOSIS — Z79899 Other long term (current) drug therapy: Secondary | ICD-10-CM | POA: Diagnosis not present

## 2023-02-15 DIAGNOSIS — Z5112 Encounter for antineoplastic immunotherapy: Secondary | ICD-10-CM | POA: Diagnosis not present

## 2023-02-15 DIAGNOSIS — C180 Malignant neoplasm of cecum: Secondary | ICD-10-CM

## 2023-02-15 DIAGNOSIS — I10 Essential (primary) hypertension: Secondary | ICD-10-CM | POA: Diagnosis not present

## 2023-02-15 DIAGNOSIS — Z5111 Encounter for antineoplastic chemotherapy: Secondary | ICD-10-CM | POA: Diagnosis not present

## 2023-02-15 DIAGNOSIS — Z95828 Presence of other vascular implants and grafts: Secondary | ICD-10-CM

## 2023-02-15 DIAGNOSIS — C7801 Secondary malignant neoplasm of right lung: Secondary | ICD-10-CM | POA: Diagnosis not present

## 2023-02-15 DIAGNOSIS — Z87891 Personal history of nicotine dependence: Secondary | ICD-10-CM | POA: Diagnosis not present

## 2023-02-15 MED ORDER — SODIUM CHLORIDE 0.9% FLUSH
10.0000 mL | INTRAVENOUS | Status: DC | PRN
  Administered 2023-02-15: 10 mL

## 2023-02-15 MED ORDER — HEPARIN SOD (PORK) LOCK FLUSH 100 UNIT/ML IV SOLN
500.0000 [IU] | Freq: Once | INTRAVENOUS | Status: AC | PRN
  Administered 2023-02-15: 500 [IU]

## 2023-02-15 NOTE — Progress Notes (Signed)
Patient presents today for 5FU pump stop and disconnection after 46 hour continous infusion.   5FU pump deaccessed.  Patients port flushed without difficulty.  Good blood return noted with no bruising or swelling noted at site.  Needle removed intact.  Band aid applied.  VSS with discharge and left in satisfactory condition via wheelchair with no s/s of distress noted.

## 2023-02-21 ENCOUNTER — Ambulatory Visit (INDEPENDENT_AMBULATORY_CARE_PROVIDER_SITE_OTHER): Payer: Medicare Other | Admitting: Internal Medicine

## 2023-02-21 ENCOUNTER — Encounter: Payer: Self-pay | Admitting: Internal Medicine

## 2023-02-21 ENCOUNTER — Telehealth (INDEPENDENT_AMBULATORY_CARE_PROVIDER_SITE_OTHER): Payer: Self-pay | Admitting: Internal Medicine

## 2023-02-21 VITALS — BP 91/47 | HR 79 | Temp 97.1°F | Ht 71.0 in | Wt 183.9 lb

## 2023-02-21 DIAGNOSIS — C180 Malignant neoplasm of cecum: Secondary | ICD-10-CM

## 2023-02-21 DIAGNOSIS — K766 Portal hypertension: Secondary | ICD-10-CM | POA: Diagnosis not present

## 2023-02-21 DIAGNOSIS — Z8619 Personal history of other infectious and parasitic diseases: Secondary | ICD-10-CM | POA: Diagnosis not present

## 2023-02-21 DIAGNOSIS — I8500 Esophageal varices without bleeding: Secondary | ICD-10-CM

## 2023-02-21 NOTE — Telephone Encounter (Signed)
EGD with banding scheduled while pt in office. Instructions given to patient. Will call with pre op appt.   Per [id number] or Prior Authorization is not required for the requested services You are not required to submit a notification/prior authorization based on the information provided. The number above acknowledges your inquiry and our response. Please write this number down and refer to it for future inquiries. If you still wish to submit your request for review, please select the Continue with Submission button below. Decision ID #: [id number]

## 2023-02-21 NOTE — H&P (View-Only) (Signed)
  Primary Care Physician:  Boyd, William S, PA Primary Gastroenterologist:  Dr. Blaise Grieshaber  Chief Complaint  Patient presents with   Esophageal varices    Patient here today in the clinic after being referred by oncology for esophageal varices. Per 01/22/2023 Ct chest  Mild fatty liver infiltration. 6. Few varices suggested along the lower esophagus, nonspecific.Patient has a history of Hep C and says he was treated for this 10-15 years ago. He does not remember the name of the medication he was treated with.        HPI:   Evan [name] is a 66 y.o. male who presents to clinic today by referral from his oncologist Dr. Katragadda for evaluation.  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  Recently underwent surveillance imaging with CT chest abdomen pelvis with contrast, which I personally reviewed, which showed few varices along the lower esophagus, patent portal vein and splenic vein.  Patient states he has not had a colonoscopy since his previous surgery.  Patient denies any melena hematochezia.  No previous upper endoscopy.  History of chronic hepatitis C treated with Harvoni with subsequent SVR.  Fatty liver noted on CT imaging.  Previous liver care: 1. Hepatitis B immunity: #1 HEPLISAV administered 11/12/2018, #2 HEPLISAV administered 01/06/2019 2. HIV negative 3. Harvoni start Date: 10/08/2018 - completion date: 12/02/2018. Viral load WNL May 2020 4. FIBROSCAN/2019: 8.6 kPa consistent with stage F2 moderate fibrosis. 5. Hepatitis A immunity status: Hep A IgG +    Past Medical History:  Diagnosis Date   Arthritis    Colon cancer (HCC)    colon   Hepatitis C    Hypertension    Port-A-Cath in place 02/15/2022    Past Surgical History:  Procedure Laterality Date   COLON SURGERY     PORTACATH PLACEMENT Right 02/08/2022   Procedure:  INSERTION PORT-A-CATH- RIJ;  Surgeon: Pappayliou, Catherine A, DO;  Location: [facility];  Service: General;  Laterality: Right;   REPLACEMENT TOTAL KNEE Left    SHOULDER ARTHROSCOPY Bilateral    TOTAL HIP ARTHROPLASTY Right 11/09/2020   Procedure: TOTAL HIP ARTHROPLASTY ANTERIOR APPROACH;  Surgeon: Murphy, Timothy D, MD;  Location: [facility];  Service: Orthopedics;  Laterality: Right;   WRIST SURGERY Left     Current Outpatient Medications  Medication Sig Dispense Refill   aluminum-magnesium hydroxide-simethicone (MAALOX) 200-200-20 MG/5ML SUSP Take 30 mLs by mouth 4 (four) times daily -  before meals and at bedtime. 1680 mL 2   amLODipine (NORVASC) 10 MG tablet Take 1 tablet (10 mg total) by mouth daily. 90 tablet 3   Bevacizumab (AVASTIN IV) Inject into the vein every 14 (fourteen) days. *start date TBD     fluorouracil CALGB 80702 2,400 mg/m2 in sodium chloride 0.9 % 150 mL Inject 2,400 mg/m2 into the vein over 48 hr.     FLUOROURACIL IV Inject into the vein every 14 (fourteen) days.     Lactulose 20 GM/30ML SOLN Take 15 mLs (10 g total) by mouth at bedtime. Take 15 ml at bedtime every night to assist with regular bowel movements.  Titrate down if having multiple bowel movements.  If a bowel movement has not occurred in 3 to 4 days or longer, then take 15 ml every 3 hours until a bowel movent has occurred. 450 mL 1   LEUCOVORIN CALCIUM IV Inject into the vein every   14 (fourteen) days.     Oxycodone HCl 10 MG TABS Take 1 tablet (10 mg total) by mouth every 4 (four) hours as needed. 168 tablet 0   potassium chloride SA (KLOR-CON M) 20 MEQ tablet Take 1 tablet (20 mEq total) by mouth 2 (two) times daily. 30 tablet 3   ENULOSE 10 GM/15ML SOLN Take by mouth. (Patient not taking: Reported on 02/21/2023)     megestrol (MEGACE) 400 MG/10ML suspension Take 10 mLs (400 mg total) by mouth 2 (two) times daily. (Patient not taking: Reported on 02/21/2023) 480 mL 3   traZODone (DESYREL) 50 MG tablet Take 1 tablet  (50 mg total) by mouth at bedtime. (Patient not taking: Reported on 02/21/2023) 30 tablet 3   No current facility-administered medications for this visit.    Allergies as of 02/21/2023   (No Known Allergies)    Family History  Problem Relation Age of Onset   Heart disease Mother    Dementia Father    Heart disease Sister    Cancer Brother    Hypertension Brother    Hypertension Brother    Healthy Son    Healthy Son    Healthy Son    Healthy Son    Healthy Daughter    Healthy Daughter    Healthy Daughter     Social History   Socioeconomic History   Marital status: Married    Spouse name: Not on file   Number of children: 7   Years of education: Not on file   Highest education level: Not on file  Occupational History   Occupation: employed  Tobacco Use   Smoking status: Former    Packs/day: 1.50    Years: 20.00    Total pack years: 30.00    Types: Cigarettes    Quit date: 11/19/2005    Years since quitting: 17.2   Smokeless tobacco: Never  Vaping Use   Vaping Use: Never used  Substance and Sexual Activity   Alcohol use: Not Currently   Drug use: Never   Sexual activity: Yes  Other Topics Concern   Not on file  Social History Narrative   ** Merged History Encounter **       Separated from wife 11/2021   Social Determinants of Health   Financial Resource Strain: Low Risk  (10/07/2019)   Overall Financial Resource Strain (CARDIA)    Difficulty of Paying Living Expenses: Not very hard  Food Insecurity: No Food Insecurity (10/07/2019)   Hunger Vital Sign    Worried About Running Out of Food in the Last Year: Never true    Ran Out of Food in the Last Year: Never true  Transportation Needs: No Transportation Needs (10/07/2019)   PRAPARE - Transportation    Lack of Transportation (Medical): No    Lack of Transportation (Non-Medical): No  Physical Activity: Inactive (10/07/2019)   Exercise Vital Sign    Days of Exercise per Week: 0 days    Minutes of  Exercise per Session: 0 min  Stress: No Stress Concern Present (10/07/2019)   [employer] - Occupational Stress Questionnaire    Feeling of Stress : Not at all  Social Connections: Moderately Integrated (10/07/2019)   Social Connection and Isolation Panel [NHANES]    Frequency of Communication with Friends and Family: Once a week    Frequency of Social Gatherings with Friends and Family: Once a week    Attends Religious Services: More than 4 times per year      Active Member of Clubs or Organizations: Yes    Attends Club or Organization Meetings: More than 4 times per year    Marital Status: Married  Intimate Partner Violence: Not At Risk (10/07/2019)   Humiliation, Afraid, Rape, and Kick questionnaire    Fear of Current or Ex-Partner: No    Emotionally Abused: No    Physically Abused: No    Sexually Abused: No    Subjective: Review of Systems  Constitutional:  Negative for chills and fever.  HENT:  Negative for congestion and hearing loss.   Eyes:  Negative for blurred vision and double vision.  Respiratory:  Negative for cough and shortness of breath.   Cardiovascular:  Negative for chest pain and palpitations.  Gastrointestinal:  Negative for abdominal pain, blood in stool, constipation, diarrhea, heartburn, melena and vomiting.  Genitourinary:  Negative for dysuria and urgency.  Musculoskeletal:  Negative for joint pain and myalgias.  Skin:  Negative for itching and rash.  Neurological:  Negative for dizziness and headaches.  Psychiatric/Behavioral:  Negative for depression. The patient is not nervous/anxious.        Objective: BP (!) 91/47 (BP Location: Left Arm, Patient Position: Sitting, Cuff Size: Normal)   Pulse 79   Temp (!) 97.1 F (36.2 C) (Temporal)   Ht 5' 11" (1.803 m)   Wt 183 lb 14.4 oz (83.4 kg)   BMI 25.65 kg/m  Physical Exam Constitutional:      Appearance: Normal appearance.  HENT:     Head: Normocephalic and  atraumatic.  Eyes:     Extraocular Movements: Extraocular movements intact.     Conjunctiva/sclera: Conjunctivae normal.  Cardiovascular:     Rate and Rhythm: Normal rate and regular rhythm.  Pulmonary:     Effort: Pulmonary effort is normal.     Breath sounds: Normal breath sounds.  Abdominal:     General: Bowel sounds are normal.     Palpations: Abdomen is soft.  Musculoskeletal:        General: Normal range of motion.     Cervical back: Normal range of motion and neck supple.  Skin:    General: Skin is warm.  Neurological:     General: No focal deficit present.     Mental Status: He is alert and oriented to person, place, and time.  Psychiatric:        Mood and Affect: Mood normal.        Behavior: Behavior normal.      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  Possible he has esophageal varices/portal hypertension related to his Bevacizumab.  Discussed treatment options in depth today.  Will schedule for upper endoscopy to further evaluate.  If he has esophageal varices amenable to banding will proceed with EVL.   The risks including infection, bleed, or perforation as well as benefits, limitations, alternatives and imponderables have been reviewed with the patient. Potential for esophageal dilation, biopsy, etc. have also been reviewed.  Questions have been answered. All parties agreeable.  Patient denies having surveillance colonoscopy after his surgery.  I would be happy to perform this as well, patient to discuss with Dr. Katragadda further.  Further recommendations after endoscopic evaluation.  Thank you Dr. Katragadda for the kind referral.  02/21/2023 9:09 AM   Disclaimer: This note was dictated with voice recognition software. Similar sounding words   can  inadvertently be transcribed and may not be corrected upon review.  

## 2023-02-21 NOTE — Progress Notes (Signed)
Primary Care Physician:  Lavella Lemons, PA Primary Gastroenterologist:  Dr. Abbey Chatters  Chief Complaint  Patient presents with   Esophageal varices    Patient here today in the clinic after being referred by oncology for esophageal varices. Per 01/22/2023 Ct chest  Mild fatty liver infiltration. 6. Few varices suggested along the lower esophagus, nonspecific.Patient has a history of Hep C and says he was treated for this 10-15 years ago. He does not remember the name of the medication he was treated with.        HPI:   Evan [name] is a 67 y.o. male who presents to clinic today by referral from his oncologist Dr. Delton Coombes for evaluation.  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  Recently underwent surveillance imaging with CT chest abdomen pelvis with contrast, which I personally reviewed, which showed few varices along the lower esophagus, patent portal vein and splenic vein.  Patient states he has not had a colonoscopy since his previous surgery.  Patient denies any melena hematochezia.  No previous upper endoscopy.  History of chronic hepatitis C treated with Harvoni with subsequent SVR.  Fatty liver noted on CT imaging.  Previous liver care: 1. Hepatitis B immunity: #1 HEPLISAV administered 11/12/2018, #2 HEPLISAV administered 01/06/2019 2. HIV negative 3. Harvoni start Date: 10/08/2018 - completion date: 12/02/2018. Viral load WNL May 2020 4. FIBROSCAN/2019: 8.6 kPa consistent with stage F2 moderate fibrosis. 5. Hepatitis A immunity status: Hep A IgG +    Past Medical History:  Diagnosis Date   Arthritis    Colon cancer ([place])    colon   Hepatitis C    Hypertension    Port-A-Cath in place 02/15/2022    Past Surgical History:  Procedure Laterality Date   COLON SURGERY     PORTACATH PLACEMENT Right 02/08/2022   Procedure:  INSERTION PORT-A-CATH- RIJ;  Surgeon: Rusty Aus, DO;  Location: [facility];  Service: General;  Laterality: Right;   REPLACEMENT TOTAL KNEE Left    SHOULDER ARTHROSCOPY Bilateral    TOTAL HIP ARTHROPLASTY Right 11/09/2020   Procedure: TOTAL HIP ARTHROPLASTY ANTERIOR APPROACH;  Surgeon: Renette Butters, MD;  Location: [facility];  Service: Orthopedics;  Laterality: Right;   WRIST SURGERY Left     Current Outpatient Medications  Medication Sig Dispense Refill   aluminum-magnesium hydroxide-simethicone (MAALOX) [id number] MG/5ML SUSP Take 30 mLs by mouth 4 (four) times daily -  before meals and at bedtime. 1680 mL 2   amLODipine (NORVASC) 10 MG tablet Take 1 tablet (10 mg total) by mouth daily. 90 tablet 3   Bevacizumab (AVASTIN IV) Inject into the vein every 14 (fourteen) days. *start date TBD     fluorouracil CALGB 57846 2,400 mg/m2 in sodium chloride 0.9 % 150 mL Inject 2,400 mg/m2 into the vein over 48 hr.     FLUOROURACIL IV Inject into the vein every 14 (fourteen) days.     Lactulose 20 GM/30ML SOLN Take 15 mLs (10 g total) by mouth at bedtime. Take 15 ml at bedtime every night to assist with regular bowel movements.  Titrate down if having multiple bowel movements.  If a bowel movement has not occurred in 3 to 4 days or longer, then take 15 ml every 3 hours until a bowel movent has occurred. 450 mL 1   LEUCOVORIN CALCIUM IV Inject into the vein every  14 (fourteen) days.     Oxycodone HCl 10 MG TABS Take 1 tablet (10 mg total) by mouth every 4 (four) hours as needed. 168 tablet 0   potassium chloride SA (KLOR-CON M) 20 MEQ tablet Take 1 tablet (20 mEq total) by mouth 2 (two) times daily. 30 tablet 3   ENULOSE 10 GM/15ML SOLN Take by mouth. (Patient not taking: Reported on 02/21/2023)     megestrol (MEGACE) 400 MG/10ML suspension Take 10 mLs (400 mg total) by mouth 2 (two) times daily. (Patient not taking: Reported on 02/21/2023) 480 mL 3   traZODone (DESYREL) 50 MG tablet Take 1 tablet  (50 mg total) by mouth at bedtime. (Patient not taking: Reported on 02/21/2023) 30 tablet 3   No current facility-administered medications for this visit.    Allergies as of 02/21/2023   (No Known Allergies)    Family History  Problem Relation Age of Onset   Heart disease Mother    Dementia Father    Heart disease Sister    Cancer Brother    Hypertension Brother    Hypertension Brother    Healthy Son    Healthy Son    Healthy Son    Healthy Son    Healthy Daughter    Healthy Daughter    Healthy Daughter     Social History   Socioeconomic History   Marital status: Married    Spouse name: Not on file   Number of children: 7   Years of education: Not on file   Highest education level: Not on file  Occupational History   Occupation: employed  Tobacco Use   Smoking status: Former    Packs/day: 1.50    Years: 20.00    Total pack years: 30.00    Types: Cigarettes    Quit date: 11/19/2005    Years since quitting: 17.2   Smokeless tobacco: Never  Vaping Use   Vaping Use: Never used  Substance and Sexual Activity   Alcohol use: Not Currently   Drug use: Never   Sexual activity: Yes  Other Topics Concern   Not on file  Social History Narrative   ** Merged History Encounter **       Separated from wife 11/2021   Social Determinants of Health   Financial Resource Strain: Low Risk  (10/07/2019)   Overall Financial Resource Strain (CARDIA)    Difficulty of Paying Living Expenses: Not very hard  Food Insecurity: No Food Insecurity (10/07/2019)   Hunger Vital Sign    Worried About Running Out of Food in the Last Year: Never true    [place] in the Last Year: Never true  Transportation Needs: No Transportation Needs (10/07/2019)   PRAPARE - Hydrologist (Medical): No    Lack of Transportation (Non-Medical): No  Physical Activity: Inactive (10/07/2019)   Exercise Vital Sign    Days of Exercise per Week: 0 days    Minutes of  Exercise per Session: 0 min  Stress: No Stress Concern Present (10/07/2019)   [place]    Feeling of Stress : Not at all  Social Connections: Moderately Integrated (10/07/2019)   Social Connection and Isolation Panel [NHANES]    Frequency of Communication with Friends and Family: Once a week    Frequency of Social Gatherings with Friends and Family: Once a week    Attends Religious Services: More than 4 times per year  Active Member of Clubs or Organizations: Yes    Attends Archivist Meetings: More than 4 times per year    Marital Status: Married  Human resources officer Violence: Not At Risk (10/07/2019)   Humiliation, Afraid, Rape, and Kick questionnaire    Fear of Current or Ex-Partner: No    Emotionally Abused: No    Physically Abused: No    Sexually Abused: No    Subjective: Review of Systems  Constitutional:  Negative for chills and fever.  HENT:  Negative for congestion and hearing loss.   Eyes:  Negative for blurred vision and double vision.  Respiratory:  Negative for cough and shortness of breath.   Cardiovascular:  Negative for chest pain and palpitations.  Gastrointestinal:  Negative for abdominal pain, blood in stool, constipation, diarrhea, heartburn, melena and vomiting.  Genitourinary:  Negative for dysuria and urgency.  Musculoskeletal:  Negative for joint pain and myalgias.  Skin:  Negative for itching and rash.  Neurological:  Negative for dizziness and headaches.  Psychiatric/Behavioral:  Negative for depression. The patient is not nervous/anxious.        Objective: BP (!) 91/47 (BP Location: Left Arm, Patient Position: Sitting, Cuff Size: Normal)   Pulse 79   Temp (!) 97.1 F (36.2 C) (Temporal)   Ht '5\' 11"'$  (1.803 m)   Wt 183 lb 14.4 oz (83.4 kg)   BMI 25.65 kg/m  Physical Exam Constitutional:      Appearance: Normal appearance.  HENT:     Head: Normocephalic and  atraumatic.  Eyes:     Extraocular Movements: Extraocular movements intact.     Conjunctiva/sclera: Conjunctivae normal.  Cardiovascular:     Rate and Rhythm: Normal rate and regular rhythm.  Pulmonary:     Effort: Pulmonary effort is normal.     Breath sounds: Normal breath sounds.  Abdominal:     General: Bowel sounds are normal.     Palpations: Abdomen is soft.  Musculoskeletal:        General: Normal range of motion.     Cervical back: Normal range of motion and neck supple.  Skin:    General: Skin is warm.  Neurological:     General: No focal deficit present.     Mental Status: He is alert and oriented to person, place, and time.  Psychiatric:        Mood and Affect: Mood normal.        Behavior: Behavior normal.      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  Possible he has esophageal varices/portal hypertension related to his Bevacizumab.  Discussed treatment options in depth today.  Will schedule for upper endoscopy to further evaluate.  If he has esophageal varices amenable to banding will proceed with EVL.   The risks including infection, bleed, or perforation as well as benefits, limitations, alternatives and imponderables have been reviewed with the patient. Potential for esophageal dilation, biopsy, etc. have also been reviewed.  Questions have been answered. All parties agreeable.  Patient denies having surveillance colonoscopy after his surgery.  I would be happy to perform this as well, patient to discuss with Dr. Delton Coombes further.  Further recommendations after endoscopic evaluation.  Thank you Dr. Delton Coombes for the kind referral.  02/21/2023 9:09 AM   Disclaimer: This note was dictated with voice recognition software. Similar sounding words  can  inadvertently be transcribed and may not be corrected upon review.

## 2023-02-21 NOTE — Patient Instructions (Signed)
We will schedule you for upper endoscopy to evaluate for esophageal varices.  I may elect to treat these with banding versus medication depending on size if you do in fact have them.  Recommend discussing with Dr. Raliegh Ip of oncology role of surveillance colonoscopy.  We would be happy to perform this as well if needed.  It was very nice meeting you today.  Dr. Abbey Chatters

## 2023-02-22 NOTE — Telephone Encounter (Signed)
Pt pre op scheduled for 03/07/23 '@11'$ :00am in person at [facility]. Attempted to contact patient but no voicemail set up

## 2023-02-23 DIAGNOSIS — C189 Malignant neoplasm of colon, unspecified: Secondary | ICD-10-CM | POA: Diagnosis not present

## 2023-02-26 NOTE — Progress Notes (Signed)
Evan [name] [address] [place] [address]    Clinic Day:  02/27/2023  Referring physician: Lavella Lemons, PA  Patient Care Team: Lavella Lemons, [place] as PCP - General (Physician Assistant) Derek Jack, MD as Medical Oncologist (Medical Oncology) Brien Mates, RN as Oncology Nurse Navigator (Medical Oncology)   ASSESSMENT & PLAN:   Assessment: Left lung mass with multiple lung nodules: - Presentation with dry cough for 6 months. - 30 pound weight loss in the last couple of years, weight stable over the last 6 months. - CT chest with contrast on 12/16/2021 showed bulky left hilar mass measuring 8.1 x 7.5 cm.  Numerous bilateral lung nodules of varying sizes.  Left adrenal nodule measuring 2.8 x 2.2 cm.  Mediastinal and bilateral hilar adenopathy with the largest pretracheal node measuring 4.1 x 3.1 cm. - MRI of the brain from 01/04/2022 which was negative for metastatic disease. - CTAP from 01/03/2022 which showed isolated left adrenal metastasis with no other evidence of metastatic disease in the abdomen or pelvis. - Pathology of left lung biopsy which shows adenocarcinoma with necrosis.  CK20 positive and CDX2 positive but negative for CK7 indicating colonic primary. 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  HER2 negative.  TMB low.  MSI-stable.  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  Other targetable mutations negative. - FOLFIRI started on 02/22/2022, bevacizumab added with cycle 4  - CT CAP (05/17/2022): Mediastinal and hilar lymph nodes have decreased in size.  Largest perihilar left lower lobe lung mass has decreased in size.  Right upper lobe mass slightly increased in size.  Other nodules are stable.  Left adrenal mass decreased to 1.3 cm from 2.8 cm. - CT scan showed mixed response as it was compared to CT from 12/16/2021.  He did not start chemotherapy until 02/22/2022.   Social/family history: - Lives by himself.  He paints houses for living. - He quit  smoking 12 years back and started back again 1 and half year ago and smoked half pack per day.  He quit again about 1 week ago. - Father had cancer, type unknown to the patient.  Brother died of brain tumor.  3.  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  Margins negative.  No lymphovascular or perineural invasion. - Received 3 cycles of XELOX followed by Xeloda for total of 6 months.  Oxaliplatin discontinued during cycle 4 secondary to transaminitis, elevated bilirubin and thrombocytopenia.  However he was also treated for hep C with Harvoni after that.  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  Hilar and mediastinal lymph nodes stable.  Stable left adrenal nodule.  No new findings. - As he had varices on CT scan, we have referred to GI.  He was evaluated by Dr. Abbey Chatters and is having EGD and possible banding on 03/12/2023. - Reviewed labs today which showed normal LFTs.  CBC was grossly normal.  Last CEA was 44.5.  UA was negative for protein. - Continue 5-FU and bevacizumab maintenance every 2 weeks.  RTC 4 weeks for follow-up.  Will check CEA at next visit.  2.  Lower rib/epigastric pain: - Continue oxycodone 10 mg every 4 hours as needed.  Pain is well-controlled.  3.  Difficulty falling asleep: - Continue trazodone as needed.  4.  Hypertension: - Continue  amlodipine 10 mg daily.  Blood pressure today is 120/82.  No orders of the defined types were placed in this encounter.     I,Alexis Herring,acting as a Education administrator for [employer], MD.,have documented all relevant documentation on the behalf of Derek Jack, MD,as directed by  Derek Jack, MD while in the presence of Derek Jack, MD.   I, Derek Jack MD, have reviewed the above documentation for accuracy and completeness, and I agree  with the above.   Derek Jack, MD   3/19/20245:09 PM  CHIEF COMPLAINT:   Diagnosis: metastatic colon cancer to the lungs and left adrenal gland    Cancer Staging  Cecal cancer [facility]) Staging form: Colon and Rectum, AJCC 8th Edition - Clinical stage from 10/07/2019: Stage IIIB (cT3, cN1, cM0) - Signed by Derek Jack, MD on 10/07/2019 - Pathologic stage from 01/23/2022: Stage IVB (rpTX, pN0, pM1b) - Unsigned    Prior Therapy: none  Current Therapy:  K-ras G12 D mutation.  HER2 negative.  TMB low.  MSI-stable.  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  Other targetable mutations negative.    HISTORY OF PRESENT ILLNESS:   Oncology History  Cecal cancer ([place])  10/07/2019 Initial Diagnosis   Cecal cancer ([place])   10/07/2019 Cancer Staging   Staging form: Colon and Rectum, AJCC 8th Edition - Clinical stage from 10/07/2019: Stage IIIB (cT3, cN1, cM0) - Signed by Derek Jack, MD on 10/07/2019   02/22/2022 - 08/08/2022 Chemotherapy   Patient is on Treatment Plan : COLORECTAL FOLFIRI / BEVACIZUMAB Q14D     02/22/2022 -  Chemotherapy   Patient is on Treatment Plan : COLORECTAL FOLFIRI + Bevacizumab q14d        INTERVAL HISTORY:   Shelton is a 67 y.o. male presenting to clinic today for follow up of metastatic colon cancer to the lungs and left adrenal gland. He was last seen by me on 01/30/23.  Today, he states that he is doing well overall. His appetite level is at 50%. His energy level is at 75%. He denies any new onset pains.  Chest pain is stable.  He continues to work full-time.   PAST MEDICAL HISTORY:   Past Medical History: Past Medical History:  Diagnosis Date   Arthritis    Colon cancer ([place])    colon   Hepatitis C    Hypertension    Port-A-Cath in place 02/15/2022    Surgical History: Past Surgical History:  Procedure Laterality Date   COLON SURGERY     PORTACATH PLACEMENT Right 02/08/2022   Procedure: INSERTION PORT-A-CATH- RIJ;  Surgeon:  Rusty Aus, DO;  Location: [facility];  Service: General;  Laterality: Right;   REPLACEMENT TOTAL KNEE Left    SHOULDER ARTHROSCOPY Bilateral    TOTAL HIP ARTHROPLASTY Right 11/09/2020   Procedure: TOTAL HIP ARTHROPLASTY ANTERIOR APPROACH;  Surgeon: Renette Butters, MD;  Location: [facility];  Service: Orthopedics;  Laterality: Right;   WRIST SURGERY Left     Social History: Social History   Socioeconomic History   Marital status: Married    Spouse name: Not on file   Number of children: 7   Years of education: Not on file   Highest education level: Not on file  Occupational History   Occupation: employed  Tobacco Use   Smoking status: Former    Packs/day: 1.50    Years: 20.00    Additional pack years: 0.00    Total pack years: 30.00    Types: Cigarettes  Quit date: 11/19/2005    Years since quitting: 17.2   Smokeless tobacco: Never  Vaping Use   Vaping Use: Never used  Substance and Sexual Activity   Alcohol use: Not Currently   Drug use: Never   Sexual activity: Yes  Other Topics Concern   Not on file  Social History Narrative   ** Merged History Encounter **       Separated from wife 11/2021   Social Determinants of Health   Financial Resource Strain: Low Risk  (10/07/2019)   Overall Financial Resource Strain (CARDIA)    Difficulty of Paying Living Expenses: Not very hard  Food Insecurity: No Food Insecurity (10/07/2019)   Hunger Vital Sign    Worried About Running Out of Food in the Last Year: Never true    Ran Out of Food in the Last Year: Never true  Transportation Needs: No Transportation Needs (10/07/2019)   PRAPARE - Hydrologist (Medical): No    Lack of Transportation (Non-Medical): No  Physical Activity: Inactive (10/07/2019)   Exercise Vital Sign    Days of Exercise per Week: 0 days    Minutes of Exercise per Session: 0 min  Stress: No Stress Concern Present (10/07/2019)   [place]    Feeling of Stress : Not at all  Social Connections: Moderately Integrated (10/07/2019)   Social Connection and Isolation Panel [NHANES]    Frequency of Communication with Friends and Family: Once a week    Frequency of Social Gatherings with Friends and Family: Once a week    Attends Religious Services: More than 4 times per year    Active Member of [employer] or Organizations: Yes    Attends Music therapist: More than 4 times per year    Marital Status: Married  Human resources officer Violence: Not At Risk (10/07/2019)   Humiliation, Afraid, Rape, and Kick questionnaire    Fear of Current or Ex-Partner: No    Emotionally Abused: No    Physically Abused: No    Sexually Abused: No    Family History: Family History  Problem Relation Age of Onset   Heart disease Mother    Dementia Father    Heart disease Sister    Cancer Brother    Hypertension Brother    Hypertension Brother    Healthy Son    Healthy Son    Healthy Son    Healthy Son    Healthy Daughter    Healthy Daughter    Healthy Daughter     Current Medications:  Current Outpatient Medications:    aluminum-magnesium hydroxide-simethicone (MAALOX) I037812 MG/5ML SUSP, Take 30 mLs by mouth 4 (four) times daily -  before meals and at bedtime., Disp: 1680 mL, Rfl: 2   amLODipine (NORVASC) 10 MG tablet, Take 1 tablet (10 mg total) by mouth daily., Disp: 90 tablet, Rfl: 3   Bevacizumab (AVASTIN IV), Inject into the vein every 14 (fourteen) days. *start date TBD, Disp: , Rfl:    ENULOSE 10 GM/15ML SOLN, Take by mouth., Disp: , Rfl:    fluorouracil CALGB 60454 2,400 mg/m2 in sodium chloride 0.9 % 150 mL, Inject 2,400 mg/m2 into the vein over 48 hr., Disp: , Rfl:    FLUOROURACIL IV, Inject into the vein every 14 (fourteen) days., Disp: , Rfl:    Lactulose 20 GM/30ML SOLN, Take 15 mLs (10 g total) by mouth at bedtime. Take 15 ml at bedtime every night  to assist with regular  bowel movements.  Titrate down if having multiple bowel movements.  If a bowel movement has not occurred in 3 to 4 days or longer, then take 15 ml every 3 hours until a bowel movent has occurred., Disp: 450 mL, Rfl: 1   LEUCOVORIN CALCIUM IV, Inject into the vein every 14 (fourteen) days., Disp: , Rfl:    megestrol (MEGACE) 400 MG/10ML suspension, Take 10 mLs (400 mg total) by mouth 2 (two) times daily., Disp: 480 mL, Rfl: 3   Oxycodone HCl 10 MG TABS, Take 1 tablet (10 mg total) by mouth every 4 (four) hours as needed., Disp: 168 tablet, Rfl: 0   potassium chloride SA (KLOR-CON M) 20 MEQ tablet, Take 1 tablet (20 mEq total) by mouth 2 (two) times daily., Disp: 30 tablet, Rfl: 3   traZODone (DESYREL) 50 MG tablet, Take 1 tablet (50 mg total) by mouth at bedtime., Disp: 30 tablet, Rfl: 3 No current facility-administered medications for this visit.  Facility-Administered Medications Ordered in Other Visits:    fluorouracil (ADRUCIL) 5,000 mg in sodium chloride 0.9 % 150 mL chemo infusion, 2,400 mg/m2 (Treatment Plan Recorded), Intravenous, 1 day or 1 dose, Derek Jack, MD, Infusion Verify at 02/27/23 1227   heparin lock flush 100 unit/mL, 500 Units, Intracatheter, Once PRN, Derek Jack, MD   sodium chloride flush (NS) 0.9 % injection 10 mL, 10 mL, Intracatheter, PRN, Derek Jack, MD   Allergies: No Known Allergies  REVIEW OF SYSTEMS:   Review of Systems  Constitutional:  Negative for chills, fatigue and fever.  HENT:   Negative for lump/mass, mouth sores, nosebleeds, sore throat and trouble swallowing.   Eyes:  Negative for eye problems.  Respiratory:  Positive for shortness of breath. Negative for cough.   Cardiovascular:  Negative for chest pain, leg swelling and palpitations.  Gastrointestinal:  Positive for constipation and diarrhea. Negative for abdominal pain, nausea and vomiting.  Genitourinary:  Negative for bladder incontinence, difficulty urinating,  dysuria, frequency, hematuria and nocturia.   Musculoskeletal:  Negative for arthralgias, back pain, flank pain, myalgias and neck pain.  Skin:  Negative for itching and rash.  Neurological:  Negative for dizziness, headaches and numbness.  Hematological:  Does not bruise/bleed easily.  Psychiatric/Behavioral:  Negative for depression, sleep disturbance and suicidal ideas. The patient is not nervous/anxious.   All other systems reviewed and are negative.    VITALS:   Blood pressure 119/82, pulse 66, temperature 97.7 F (36.5 C), temperature source Tympanic, resp. rate 18, height 5\' 11"  (1.803 m), weight 183 lb 11.2 oz (83.3 kg), SpO2 99 %.  Wt Readings from Last 3 Encounters:  02/27/23 183 lb 11.2 oz (83.3 kg)  02/27/23 185 lb (83.9 kg)  02/21/23 183 lb 14.4 oz (83.4 kg)    Body mass index is 25.62 kg/m.  Performance status (ECOG): 1 - Symptomatic but completely ambulatory  PHYSICAL EXAM:   Physical Exam Vitals and nursing note reviewed. Exam conducted with a chaperone present.  Constitutional:      Appearance: Normal appearance.  Cardiovascular:     Rate and Rhythm: Normal rate and regular rhythm.     Pulses: Normal pulses.     Heart sounds: Normal heart sounds.  Pulmonary:     Effort: Pulmonary effort is normal.     Breath sounds: Normal breath sounds.  Abdominal:     Palpations: Abdomen is soft. There is no hepatomegaly, splenomegaly or mass.     Tenderness: There is no abdominal tenderness.  Musculoskeletal:     Right lower leg: No edema.     Left lower leg: No edema.  Lymphadenopathy:     Cervical: No cervical adenopathy.     Right cervical: No superficial, deep or posterior cervical adenopathy.    Left cervical: No superficial, deep or posterior cervical adenopathy.     Upper Body:     Right upper body: No supraclavicular or axillary adenopathy.     Left upper body: No supraclavicular or axillary adenopathy.  Neurological:     General: No focal deficit present.      Mental Status: He is alert and oriented to person, place, and time.  Psychiatric:        Mood and Affect: Mood normal.        Behavior: Behavior normal.     LABS:      Latest Ref Rng & Units 02/27/2023    8:36 AM 02/13/2023    9:17 AM 01/30/2023    8:58 AM  CBC  WBC 4.0 - 10.5 K/uL 4.8  4.4  5.5   Hemoglobin 13.0 - 17.0 g/dL 12.4  12.2  12.0   Hematocrit 39.0 - 52.0 % 37.9  36.7  35.8   Platelets 150 - 400 K/uL 147  144  121       Latest Ref Rng & Units 02/27/2023    8:36 AM 02/13/2023    9:17 AM 01/30/2023    8:58 AM  CMP  Glucose 70 - 99 mg/dL 132  106  145   BUN 8 - 23 mg/dL 10  11  11    Creatinine 0.61 - 1.24 mg/dL 1.12  1.01  1.03   Sodium 135 - 145 mmol/L 136  137  136   Potassium 3.5 - 5.1 mmol/L 3.5  3.5  3.2   Chloride 98 - 111 mmol/L 103  103  102   CO2 22 - 32 mmol/L 23  26  23    Calcium 8.9 - 10.3 mg/dL 8.8  9.3  8.7   Total Protein 6.5 - 8.1 g/dL 7.5  7.3  7.3   Total Bilirubin 0.3 - 1.2 mg/dL 1.2  0.8  1.4   Alkaline Phos 38 - 126 U/L 35  40  37   AST 15 - 41 U/L 23  21  35   ALT 0 - 44 U/L 8  8  11       Lab Results  Component Value Date   CEA1 44.5 (H) 01/16/2023   /  CEA  Date Value Ref Range Status  01/16/2023 44.5 (H) 0.0 - 4.7 ng/mL Final    Comment:    (NOTE)                             Nonsmokers          <3.9                             Smokers             <5.6 Roche [employer]ostics Electrochemiluminescence Immunoassay (ECLIA) Values obtained with different assay methods or kits cannot be used interchangeably.  Results cannot be interpreted as absolute evidence of the presence or absence of malignant disease. Performed At: [facility] [place], [place] [id number] Rush Farmer MD [id number]    No results found for: "PSA1" No results found for: "CAN199" No results found for: "CAN125"  No results found for: "TOTALPROTELP", "ALBUMINELP", "A1GS", "A2GS", "BETS", "BETA2SER", "GAMS", "MSPIKE", "SPEI" Lab Results   Component Value Date   TIBC 406 01/16/2023   TIBC 294 06/06/2022   TIBC 237 (L) 02/22/2022   FERRITIN 150 01/16/2023   FERRITIN 243 06/06/2022   FERRITIN 292 02/22/2022   IRONPCTSAT 18 01/16/2023   IRONPCTSAT 24 06/06/2022   IRONPCTSAT 11 (L) 02/22/2022   Lab Results  Component Value Date   LDH 258 (H) 12/26/2021     STUDIES:   No results found.

## 2023-02-27 ENCOUNTER — Inpatient Hospital Stay: Payer: Medicare Other

## 2023-02-27 ENCOUNTER — Inpatient Hospital Stay (HOSPITAL_BASED_OUTPATIENT_CLINIC_OR_DEPARTMENT_OTHER): Payer: Medicare Other | Admitting: Hematology

## 2023-02-27 DIAGNOSIS — Z87891 Personal history of nicotine dependence: Secondary | ICD-10-CM | POA: Diagnosis not present

## 2023-02-27 DIAGNOSIS — Z5111 Encounter for antineoplastic chemotherapy: Secondary | ICD-10-CM | POA: Diagnosis not present

## 2023-02-27 DIAGNOSIS — C7801 Secondary malignant neoplasm of right lung: Secondary | ICD-10-CM | POA: Diagnosis not present

## 2023-02-27 DIAGNOSIS — Z79899 Other long term (current) drug therapy: Secondary | ICD-10-CM | POA: Diagnosis not present

## 2023-02-27 DIAGNOSIS — C7972 Secondary malignant neoplasm of left adrenal gland: Secondary | ICD-10-CM | POA: Diagnosis not present

## 2023-02-27 DIAGNOSIS — C180 Malignant neoplasm of cecum: Secondary | ICD-10-CM

## 2023-02-27 DIAGNOSIS — Z95828 Presence of other vascular implants and grafts: Secondary | ICD-10-CM

## 2023-02-27 DIAGNOSIS — Z5112 Encounter for antineoplastic immunotherapy: Secondary | ICD-10-CM | POA: Diagnosis not present

## 2023-02-27 DIAGNOSIS — C7802 Secondary malignant neoplasm of left lung: Secondary | ICD-10-CM | POA: Diagnosis not present

## 2023-02-27 DIAGNOSIS — I10 Essential (primary) hypertension: Secondary | ICD-10-CM | POA: Diagnosis not present

## 2023-02-27 DIAGNOSIS — C189 Malignant neoplasm of colon, unspecified: Secondary | ICD-10-CM | POA: Diagnosis not present

## 2023-02-27 LAB — CBC WITH DIFFERENTIAL/PLATELET
Abs Immature Granulocytes: 0.02 10*3/uL (ref 0.00–0.07)
Basophils Absolute: 0.1 10*3/uL (ref 0.0–0.1)
Basophils Relative: 2 %
Eosinophils Absolute: 0.1 10*3/uL (ref 0.0–0.5)
Eosinophils Relative: 3 %
HCT: 37.9 % — ABNORMAL LOW (ref 39.0–52.0)
Hemoglobin: 12.4 g/dL — ABNORMAL LOW (ref 13.0–17.0)
Immature Granulocytes: 0 %
Lymphocytes Relative: 47 %
Lymphs Abs: 2.3 10*3/uL (ref 0.7–4.0)
MCH: 32.2 pg (ref 26.0–34.0)
MCHC: 32.7 g/dL (ref 30.0–36.0)
MCV: 98.4 fL (ref 80.0–100.0)
Monocytes Absolute: 0.8 10*3/uL (ref 0.1–1.0)
Monocytes Relative: 16 %
Neutro Abs: 1.6 10*3/uL — ABNORMAL LOW (ref 1.7–7.7)
Neutrophils Relative %: 32 %
Platelets: 147 10*3/uL — ABNORMAL LOW (ref 150–400)
RBC: 3.85 MIL/uL — ABNORMAL LOW (ref 4.22–5.81)
RDW: 15.4 % (ref 11.5–15.5)
WBC: 4.8 10*3/uL (ref 4.0–10.5)
nRBC: 0 % (ref 0.0–0.2)

## 2023-02-27 LAB — COMPREHENSIVE METABOLIC PANEL
ALT: 8 U/L (ref 0–44)
AST: 23 U/L (ref 15–41)
Albumin: 3.9 g/dL (ref 3.5–5.0)
Alkaline Phosphatase: 35 U/L — ABNORMAL LOW (ref 38–126)
Anion gap: 10 (ref 5–15)
BUN: 10 mg/dL (ref 8–23)
CO2: 23 mmol/L (ref 22–32)
Calcium: 8.8 mg/dL — ABNORMAL LOW (ref 8.9–10.3)
Chloride: 103 mmol/L (ref 98–111)
Creatinine, Ser: 1.12 mg/dL (ref 0.61–1.24)
GFR, Estimated: >60 mL/min (ref >60)
Glucose, Bld: 132 mg/dL — ABNORMAL HIGH (ref 70–99)
Potassium: 3.5 mmol/L (ref 3.5–5.1)
Sodium: 136 mmol/L (ref 135–145)
Total Bilirubin: 1.2 mg/dL (ref 0.3–1.2)
Total Protein: 7.5 g/dL (ref 6.5–8.1)

## 2023-02-27 LAB — URINALYSIS, DIPSTICK ONLY
Bilirubin Urine: NEGATIVE
Glucose, UA: NEGATIVE mg/dL
Hgb urine dipstick: NEGATIVE
Ketones, ur: NEGATIVE mg/dL
Leukocytes,Ua: NEGATIVE
Nitrite: NEGATIVE
Protein, ur: NEGATIVE mg/dL
Specific Gravity, Urine: >1.03 — ABNORMAL HIGH (ref 1.005–1.030)
pH: 5.5 (ref 5.0–8.0)

## 2023-02-27 LAB — MAGNESIUM: Magnesium: 2.2 mg/dL (ref 1.7–2.4)

## 2023-02-27 MED ORDER — SODIUM CHLORIDE 0.9 % IV SOLN
10.0000 mg | Freq: Once | INTRAVENOUS | Status: AC
  Administered 2023-02-27: 10 mg via INTRAVENOUS
  Filled 2023-02-27: qty 10

## 2023-02-27 MED ORDER — SODIUM CHLORIDE 0.9% FLUSH
10.0000 mL | Freq: Once | INTRAVENOUS | Status: AC
  Administered 2023-02-27: 10 mL via INTRAVENOUS

## 2023-02-27 MED ORDER — SODIUM CHLORIDE 0.9 % IV SOLN: Freq: Once | INTRAVENOUS | Status: AC

## 2023-02-27 MED ORDER — SODIUM CHLORIDE 0.9% FLUSH: 10.0000 mL | INTRAVENOUS | Status: DC | PRN

## 2023-02-27 MED ORDER — SODIUM CHLORIDE 0.9 % IV SOLN
2400.0000 mg/m2 | INTRAVENOUS | Status: DC
  Administered 2023-02-27: 5000 mg via INTRAVENOUS
  Filled 2023-02-27: qty 100

## 2023-02-27 MED ORDER — PALONOSETRON HCL INJECTION 0.25 MG/5ML
0.2500 mg | Freq: Once | INTRAVENOUS | Status: AC
  Administered 2023-02-27: 0.25 mg via INTRAVENOUS
  Filled 2023-02-27: qty 5

## 2023-02-27 MED ORDER — FLUOROURACIL CHEMO INJECTION 2.5 GM/50ML
400.0000 mg/m2 | Freq: Once | INTRAVENOUS | Status: AC
  Administered 2023-02-27: 800 mg via INTRAVENOUS
  Filled 2023-02-27: qty 16

## 2023-02-27 MED ORDER — SODIUM CHLORIDE 0.9 % IV SOLN
400.0000 mg/m2 | Freq: Once | INTRAVENOUS | Status: AC
  Administered 2023-02-27: 780 mg via INTRAVENOUS
  Filled 2023-02-27: qty 39

## 2023-02-27 MED ORDER — HEPARIN SOD (PORK) LOCK FLUSH 100 UNIT/ML IV SOLN: 500.0000 [IU] | Freq: Once | INTRAVENOUS | Status: DC | PRN

## 2023-02-27 MED ORDER — SODIUM CHLORIDE 0.9 % IV SOLN
5.0000 mg/kg | Freq: Once | INTRAVENOUS | Status: AC
  Administered 2023-02-27: 400 mg via INTRAVENOUS
  Filled 2023-02-27: qty 16

## 2023-02-27 NOTE — Patient Instructions (Signed)
[facility]  Discharge Instructions: Thank you for choosing [facility] to provide your oncology and hematology care.  If you have a lab appointment with the [facility], please come in thru the Main Entrance and check in at the main information desk.  Wear comfortable clothing and clothing appropriate for easy access to any Portacath or PICC line.   We strive to give you quality time with your provider. You may need to reschedule your appointment if you arrive late (15 or more minutes).  Arriving late affects you and other patients whose appointments are after yours.  Also, if you miss three or more appointments without notifying the office, you may be dismissed from the clinic at the provider's discretion.      For prescription refill requests, have your pharmacy contact our office and allow 72 hours for refills to be completed.    Today you received the following chemotherapy and/or immunotherapy agents Leucovorin/Vegzelma/Fluorouracil 5FU pump      To help prevent nausea and vomiting after your treatment, we encourage you to take your nausea medication as directed.  BELOW ARE SYMPTOMS THAT SHOULD BE REPORTED IMMEDIATELY: *FEVER GREATER THAN 100.4 F (38 C) OR HIGHER *CHILLS OR SWEATING *NAUSEA AND VOMITING THAT IS NOT CONTROLLED WITH YOUR NAUSEA MEDICATION *UNUSUAL SHORTNESS OF BREATH *UNUSUAL BRUISING OR BLEEDING *URINARY PROBLEMS (pain or burning when urinating, or frequent urination) *BOWEL PROBLEMS (unusual diarrhea, constipation, pain near the anus) TENDERNESS IN MOUTH AND THROAT WITH OR WITHOUT PRESENCE OF ULCERS (sore throat, sores in mouth, or a toothache) UNUSUAL RASH, SWELLING OR PAIN  UNUSUAL VAGINAL DISCHARGE OR ITCHING   Items with * indicate a potential emergency and should be followed up as soon as possible or go to the Emergency Department if any problems should occur.  Please show the CHEMOTHERAPY ALERT CARD or IMMUNOTHERAPY ALERT  CARD at check-in to the Emergency Department and triage nurse.  Should you have questions after your visit or need to cancel or reschedule your appointment, please contact [facility] [phone]  and follow the prompts.  Office hours are 8:00 a.m. to 4:30 p.m. Monday - Friday. Please note that voicemails left after 4:00 p.m. may not be returned until the following business day.  We are closed weekends and major holidays. You have access to a nurse at all times for urgent questions. Please call the main number to the clinic [phone] and follow the prompts.  For any non-urgent questions, you may also contact your provider using MyChart. We now offer e-Visits for anyone 62 and older to request care online for non-urgent symptoms. For details visit [email or web address].   Also download the MyChart app! Go to the app store, search "MyChart", open the app, select [facility], and log in with your MyChart username and password.

## 2023-02-27 NOTE — Telephone Encounter (Signed)
Pt contacted and made aware of pre op appt scheduled at [facility] for 03/07/23 at 11 AM.

## 2023-02-27 NOTE — Progress Notes (Signed)
Patient has been examined by Dr. Katragadda. Vital signs and labs have been reviewed by MD - ANC, Creatinine, LFTs, hemoglobin, and platelets are within treatment parameters per M.D. - pt may proceed with treatment.  Primary RN and pharmacy notified.  

## 2023-02-27 NOTE — Patient Instructions (Signed)
[facility] Discharge Instructions   You were seen and examined today by Dr. Katragadda.  He reviewed the results of your lab work which are normal/stable.   We will proceed with your treatment today.  Return as scheduled.    Thank you for choosing [facility] at [facility] to provide your oncology and hematology care.  To afford each patient quality time with our provider, please arrive at least 15 minutes before your scheduled appointment time.   If you have a lab appointment with the Cancer Center please come in thru the Main Entrance and check in at the main information desk.  You need to re-schedule your appointment should you arrive 10 or more minutes late.  We strive to give you quality time with our providers, and arriving late affects you and other patients whose appointments are after yours.  Also, if you no show three or more times for appointments you may be dismissed from the clinic at the providers discretion.     Again, thank you for choosing [facility].  Our hope is that these requests will decrease the amount of time that you wait before being seen by our physicians.       _____________________________________________________________  Should you have questions after your visit to [facility], please contact our office at [phone] and follow the prompts.  Our office hours are 8:00 a.m. and 4:30 p.m. Monday - Friday.  Please note that voicemails left after 4:00 p.m. may not be returned until the following business day.  We are closed weekends and major holidays.  You do have access to a nurse 24-7, just call the main number to the clinic [phone] and do not press any options, hold on the line and a nurse will answer the phone.    For prescription refill requests, have your pharmacy contact our office and allow 72 hours.    Due to Covid, you will need to wear a mask upon entering  the hospital. If you do not have a mask, a mask will be given to you at the Main Entrance upon arrival. For doctor visits, patients may have 1 support person age 18 or older with them. For treatment visits, patients can not have anyone with them due to social distancing guidelines and our immunocompromised population.      

## 2023-02-27 NOTE — Progress Notes (Signed)
Patient presents today for Vegzelma/Leucovorin/Fluorouracil with 5FU pump start per providers order.  Vital signs and labs reviewed by MD.  Message received from Anastasio Champion RN/Dr. Delton Coombes, patient okay for treatment.  Treatment given today per MD orders.  Stable during infusion without adverse affects.  Vital signs stable.  5FU pump connected and verified RUN on the screen with patient.  No complaints at this time.  Discharge from clinic ambulatory in stable condition.  Alert and oriented X 3.  Follow up with [facility] as scheduled.

## 2023-03-01 ENCOUNTER — Inpatient Hospital Stay: Payer: Medicare Other

## 2023-03-01 VITALS — BP 125/86 | HR 70 | Temp 97.6°F | Resp 18

## 2023-03-01 DIAGNOSIS — C7972 Secondary malignant neoplasm of left adrenal gland: Secondary | ICD-10-CM | POA: Diagnosis not present

## 2023-03-01 DIAGNOSIS — Z5112 Encounter for antineoplastic immunotherapy: Secondary | ICD-10-CM | POA: Diagnosis not present

## 2023-03-01 DIAGNOSIS — C180 Malignant neoplasm of cecum: Secondary | ICD-10-CM | POA: Diagnosis not present

## 2023-03-01 DIAGNOSIS — C7802 Secondary malignant neoplasm of left lung: Secondary | ICD-10-CM | POA: Diagnosis not present

## 2023-03-01 DIAGNOSIS — Z87891 Personal history of nicotine dependence: Secondary | ICD-10-CM | POA: Diagnosis not present

## 2023-03-01 DIAGNOSIS — Z5111 Encounter for antineoplastic chemotherapy: Secondary | ICD-10-CM | POA: Diagnosis not present

## 2023-03-01 DIAGNOSIS — Z79899 Other long term (current) drug therapy: Secondary | ICD-10-CM | POA: Diagnosis not present

## 2023-03-01 DIAGNOSIS — I10 Essential (primary) hypertension: Secondary | ICD-10-CM | POA: Diagnosis not present

## 2023-03-01 DIAGNOSIS — Z95828 Presence of other vascular implants and grafts: Secondary | ICD-10-CM

## 2023-03-01 DIAGNOSIS — C7801 Secondary malignant neoplasm of right lung: Secondary | ICD-10-CM | POA: Diagnosis not present

## 2023-03-01 MED ORDER — HEPARIN SOD (PORK) LOCK FLUSH 100 UNIT/ML IV SOLN
500.0000 [IU] | Freq: Once | INTRAVENOUS | Status: AC | PRN
  Administered 2023-03-01: 500 [IU]

## 2023-03-01 MED ORDER — SODIUM CHLORIDE 0.9% FLUSH
10.0000 mL | INTRAVENOUS | Status: DC | PRN
  Administered 2023-03-01: 10 mL

## 2023-03-01 NOTE — Patient Instructions (Signed)
[facility]  Discharge Instructions: Thank you for choosing [facility] to provide your oncology and hematology care.  If you have a lab appointment with the Cancer Center, please come in thru the Main Entrance and check in at the main information desk.  Wear comfortable clothing and clothing appropriate for easy access to any Portacath or PICC line.   We strive to give you quality time with your provider. You may need to reschedule your appointment if you arrive late (15 or more minutes).  Arriving late affects you and other patients whose appointments are after yours.  Also, if you miss three or more appointments without notifying the office, you may be dismissed from the clinic at the provider's discretion.      For prescription refill requests, have your pharmacy contact our office and allow 72 hours for refills to be completed.    Today you received the following chemotherapy and/or immunotherapy agents 5FU pump stop      To help prevent nausea and vomiting after your treatment, we encourage you to take your nausea medication as directed.  BELOW ARE SYMPTOMS THAT SHOULD BE REPORTED IMMEDIATELY: *FEVER GREATER THAN 100.4 F (38 C) OR HIGHER *CHILLS OR SWEATING *NAUSEA AND VOMITING THAT IS NOT CONTROLLED WITH YOUR NAUSEA MEDICATION *UNUSUAL SHORTNESS OF BREATH *UNUSUAL BRUISING OR BLEEDING *URINARY PROBLEMS (pain or burning when urinating, or frequent urination) *BOWEL PROBLEMS (unusual diarrhea, constipation, pain near the anus) TENDERNESS IN MOUTH AND THROAT WITH OR WITHOUT PRESENCE OF ULCERS (sore throat, sores in mouth, or a toothache) UNUSUAL RASH, SWELLING OR PAIN  UNUSUAL VAGINAL DISCHARGE OR ITCHING   Items with * indicate a potential emergency and should be followed up as soon as possible or go to the Emergency Department if any problems should occur.  Please show the CHEMOTHERAPY ALERT CARD or IMMUNOTHERAPY ALERT CARD at check-in to the  Emergency Department and triage nurse.  Should you have questions after your visit or need to cancel or reschedule your appointment, please contact [facility] [phone]  and follow the prompts.  Office hours are 8:00 a.m. to 4:30 p.m. Monday - Friday. Please note that voicemails left after 4:00 p.m. may not be returned until the following business day.  We are closed weekends and major holidays. You have access to a nurse at all times for urgent questions. Please call the main number to the clinic [phone] and follow the prompts.  For any non-urgent questions, you may also contact your provider using MyChart. We now offer e-Visits for anyone 18 and older to request care online for non-urgent symptoms. For details visit [email or web address].   Also download the MyChart app! Go to the app store, search "MyChart", open the app, select [facility], and log in with your MyChart username and password.   

## 2023-03-01 NOTE — Progress Notes (Signed)
Patient presents today for 5FU pump stop and disconnection after 46 hour continous infusion.   5FU pump deaccessed.  Patients port flushed without difficulty.  Good blood return noted with no bruising or swelling noted at site.  Needle removed intact.  Band aid applied.  VSS with discharge and left in satisfactory condition via wheelchair with no s/s of distress noted.    

## 2023-03-07 ENCOUNTER — Encounter: Payer: Self-pay | Admitting: Hematology

## 2023-03-07 ENCOUNTER — Encounter (HOSPITAL_COMMUNITY)
Admission: RE | Admit: 2023-03-07 | Discharge: 2023-03-07 | Disposition: A | Discharge status: Home / Self Care | Payer: Medicare Other | Source: Ambulatory Visit | Attending: Internal Medicine | Admitting: Internal Medicine

## 2023-03-07 ENCOUNTER — Encounter (HOSPITAL_COMMUNITY): Payer: Self-pay

## 2023-03-07 ENCOUNTER — Encounter (HOSPITAL_COMMUNITY): Payer: Self-pay | Admitting: Hematology

## 2023-03-07 NOTE — Progress Notes (Signed)
The following biosimilar Mvasi (bevacizumab-awwb) has been selected for use in this patient.   Henreitta Leber, PharmD 03/07/23 @ 130

## 2023-03-08 ENCOUNTER — Encounter: Payer: Self-pay | Admitting: Hematology

## 2023-03-08 ENCOUNTER — Encounter (HOSPITAL_COMMUNITY): Payer: Self-pay | Admitting: Hematology

## 2023-03-12 ENCOUNTER — Ambulatory Visit (HOSPITAL_COMMUNITY): Payer: Medicare Other | Admitting: Anesthesiology

## 2023-03-12 ENCOUNTER — Encounter (HOSPITAL_COMMUNITY): Payer: Self-pay

## 2023-03-12 ENCOUNTER — Ambulatory Visit (HOSPITAL_COMMUNITY)
Admission: RE | Admit: 2023-03-12 | Discharge: 2023-03-12 | Disposition: A | Discharge status: Home / Self Care | Payer: Medicare Other | Source: Ambulatory Visit | Attending: Internal Medicine | Admitting: Internal Medicine

## 2023-03-12 ENCOUNTER — Ambulatory Visit (HOSPITAL_BASED_OUTPATIENT_CLINIC_OR_DEPARTMENT_OTHER): Payer: Medicare Other | Admitting: Anesthesiology

## 2023-03-12 ENCOUNTER — Encounter (HOSPITAL_COMMUNITY)
Admission: RE | Disposition: A | Discharge status: Home / Self Care | Payer: Self-pay | Source: Ambulatory Visit | Attending: Internal Medicine

## 2023-03-12 DIAGNOSIS — I8500 Esophageal varices without bleeding: Secondary | ICD-10-CM

## 2023-03-12 DIAGNOSIS — Z79631 Long term (current) use of antimetabolite agent: Secondary | ICD-10-CM | POA: Insufficient documentation

## 2023-03-12 DIAGNOSIS — Z7969 Long term (current) use of other immunomodulators and immunosuppressants: Secondary | ICD-10-CM | POA: Insufficient documentation

## 2023-03-12 DIAGNOSIS — C180 Malignant neoplasm of cecum: Secondary | ICD-10-CM | POA: Insufficient documentation

## 2023-03-12 DIAGNOSIS — I10 Essential (primary) hypertension: Secondary | ICD-10-CM

## 2023-03-12 DIAGNOSIS — Z87891 Personal history of nicotine dependence: Secondary | ICD-10-CM | POA: Diagnosis not present

## 2023-03-12 DIAGNOSIS — K7460 Unspecified cirrhosis of liver: Secondary | ICD-10-CM

## 2023-03-12 DIAGNOSIS — K760 Fatty (change of) liver, not elsewhere classified: Secondary | ICD-10-CM | POA: Diagnosis not present

## 2023-03-12 DIAGNOSIS — K2289 Other specified disease of esophagus: Secondary | ICD-10-CM | POA: Diagnosis not present

## 2023-03-12 DIAGNOSIS — C7972 Secondary malignant neoplasm of left adrenal gland: Secondary | ICD-10-CM | POA: Insufficient documentation

## 2023-03-12 DIAGNOSIS — K209 Esophagitis, unspecified without bleeding: Secondary | ICD-10-CM

## 2023-03-12 DIAGNOSIS — Z9049 Acquired absence of other specified parts of digestive tract: Secondary | ICD-10-CM | POA: Insufficient documentation

## 2023-03-12 DIAGNOSIS — B182 Chronic viral hepatitis C: Secondary | ICD-10-CM | POA: Diagnosis not present

## 2023-03-12 DIAGNOSIS — C7800 Secondary malignant neoplasm of unspecified lung: Secondary | ICD-10-CM | POA: Diagnosis not present

## 2023-03-12 DIAGNOSIS — K766 Portal hypertension: Secondary | ICD-10-CM | POA: Insufficient documentation

## 2023-03-12 DIAGNOSIS — K208 Other esophagitis without bleeding: Secondary | ICD-10-CM | POA: Diagnosis not present

## 2023-03-12 HISTORY — PX: ESOPHAGOGASTRODUODENOSCOPY (EGD) WITH PROPOFOL: SHX5813

## 2023-03-12 HISTORY — PX: ESOPHAGEAL BANDING: SHX5518

## 2023-03-12 HISTORY — PX: BIOPSY: SHX5522

## 2023-03-12 SURGERY — ESOPHAGOGASTRODUODENOSCOPY (EGD) WITH PROPOFOL
Anesthesia: General

## 2023-03-12 MED ORDER — LIDOCAINE HCL (PF) 2 % IJ SOLN
INTRAMUSCULAR | Status: DC | PRN
  Administered 2023-03-12: 50 mg via INTRADERMAL

## 2023-03-12 MED ORDER — PROPOFOL 500 MG/50ML IV EMUL
INTRAVENOUS | Status: DC | PRN
  Administered 2023-03-12: 180 ug/kg/min via INTRAVENOUS

## 2023-03-12 MED ORDER — PROPOFOL 10 MG/ML IV BOLUS
INTRAVENOUS | Status: DC | PRN
  Administered 2023-03-12: 80 mg via INTRAVENOUS
  Administered 2023-03-12: 40 mg via INTRAVENOUS

## 2023-03-12 MED ORDER — PANTOPRAZOLE SODIUM 40 MG PO TBEC: 40.0000 mg | DELAYED_RELEASE_TABLET | Freq: Every day | ORAL | 11 refills | Status: AC

## 2023-03-12 MED ORDER — LIDOCAINE HCL (PF) 2 % IJ SOLN
INTRAMUSCULAR | Status: AC
  Filled 2023-03-12: qty 5

## 2023-03-12 MED ORDER — LACTATED RINGERS IV SOLN: INTRAVENOUS | Status: DC

## 2023-03-12 NOTE — Anesthesia Preprocedure Evaluation (Signed)
Anesthesia Evaluation  Patient identified by MRN, date of birth, ID band Patient awake    Reviewed: Allergy & Precautions, H&P , NPO status , Patient's Chart, lab work & pertinent test results, reviewed documented beta blocker date and time   Airway Mallampati: II  TM Distance: >3 FB Neck ROM: full    Dental no notable dental hx.    Pulmonary neg pulmonary ROS, former smoker   Pulmonary exam normal breath sounds clear to auscultation       Cardiovascular Exercise Tolerance: Good hypertension, negative cardio ROS  Rhythm:regular Rate:Normal     Neuro/Psych negative neurological ROS  negative psych ROS   GI/Hepatic negative GI ROS,,,(+) Cirrhosis   Esophageal Varices    , Hepatitis -, C  Endo/Other  negative endocrine ROS    Renal/GU negative Renal ROS  negative genitourinary   Musculoskeletal   Abdominal   Peds  Hematology negative hematology ROS (+)   Anesthesia Other Findings   Reproductive/Obstetrics negative OB ROS                             Anesthesia Physical Anesthesia Plan  ASA: 3  Anesthesia Plan: General   Post-op Pain Management:    Induction:   PONV Risk Score and Plan: Propofol infusion  Airway Management Planned:   Additional Equipment:   Intra-op Plan:   Post-operative Plan:   Informed Consent: I have reviewed the patients History and Physical, chart, labs and discussed the procedure including the risks, benefits and alternatives for the proposed anesthesia with the patient or authorized representative who has indicated his/her understanding and acceptance.     Dental Advisory Given  Plan Discussed with: CRNA  Anesthesia Plan Comments:        Anesthesia Quick Evaluation

## 2023-03-12 NOTE — Transfer of Care (Signed)
Immediate Anesthesia Transfer of Care Note  Patient: Evan [name]  Procedure(s) Performed: ESOPHAGOGASTRODUODENOSCOPY (EGD) WITH PROPOFOL ESOPHAGEAL BANDING BIOPSY  Patient Location: PACU  Anesthesia Type:General  Level of Consciousness: awake, alert , and oriented  Airway & Oxygen Therapy: Patient Spontanous Breathing  Post-op Assessment: Report given to RN, Post -op Vital signs reviewed and stable, Patient moving all extremities X 4, and Patient able to stick tongue midline  Post vital signs: Reviewed  Last Vitals:  Vitals Value Taken Time  BP 108/74   Temp 98.1   Pulse 58   Resp 15   SpO2 97     Last Pain:  Vitals:   03/12/23 1001  TempSrc:   PainSc: 0-No pain         Complications: No notable events documented.

## 2023-03-12 NOTE — Discharge Instructions (Signed)
EGD Discharge instructions Please read the instructions outlined below and refer to this sheet in the next few weeks. These discharge instructions provide you with general information on caring for yourself after you leave the hospital. Your doctor may also give you specific instructions. While your treatment has been planned according to the most current medical practices available, unavoidable complications occasionally occur. If you have any problems or questions after discharge, please call your doctor. ACTIVITY You may resume your regular activity but move at a slower pace for the next 24 hours.  Take frequent rest periods for the next 24 hours.  Walking will help expel (get rid of) the air and reduce the bloated feeling in your abdomen.  No driving for 24 hours (because of the anesthesia (medicine) used during the test).  You may shower.  Do not sign any important legal documents or operate any machinery for 24 hours (because of the anesthesia used during the test).  NUTRITION Drink plenty of fluids.  You may resume your normal diet.  Begin with a light meal and progress to your normal diet.  Avoid alcoholic beverages for 24 hours or as instructed by your caregiver.  MEDICATIONS You may resume your normal medications unless your caregiver tells you otherwise.  WHAT YOU CAN EXPECT TODAY You may experience abdominal discomfort such as a feeling of fullness or "gas" pains.  FOLLOW-UP Your doctor will discuss the results of your test with you.  SEEK IMMEDIATE MEDICAL ATTENTION IF ANY OF THE FOLLOWING OCCUR: Excessive nausea (feeling sick to your stomach) and/or vomiting.  Severe abdominal pain and distention (swelling).  Trouble swallowing.  Temperature over 101 F (37.8 C).  Rectal bleeding or vomiting of blood.   I did not see any evidence of esophageal or gastric varices.  The end portion of your esophagus did look slightly abnormal.  I took samples to rule out Barrett's  esophagus.  We will call with results.  Stomach and small bowel appeared normal.  I am going to start you on a new medication called pantoprazole 40 mg daily.  Follow-up in GI clinic in 3 months.   I hope you have a great rest of your week!  Elon Alas. Abbey Chatters, D.O. Gastroenterology and Hepatology [facility]

## 2023-03-12 NOTE — Interval H&P Note (Signed)
History and Physical Interval Note:  03/12/2023 9:16 AM  Evan [name]  has presented today for surgery, with the diagnosis of Esophageal Varices.  The various methods of treatment have been discussed with the patient and family. After consideration of risks, benefits and other options for treatment, the patient has consented to  Procedure(s) with comments: ESOPHAGOGASTRODUODENOSCOPY (EGD) WITH PROPOFOL (N/A) - 11:30AM; ASA 3 ESOPHAGEAL BANDING (N/A) as a surgical intervention.  The patient's history has been reviewed, patient examined, no change in status, stable for surgery.  I have reviewed the patient's chart and labs.  Questions were answered to the patient's satisfaction.     Eloise Harman

## 2023-03-12 NOTE — Op Note (Signed)
[facility], [place] Patient Name: Evan [name] Procedure Date: 03/12/2023 9:55 AM MRN: [id number] Date of Birth: [birth year] Attending MD: Elon Alas. 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 Abbey Chatters, DO, Lambert Mody, Raphael Gibney, Technician Referring MD:              Medicines:                See the Anesthesia note for documentation of the                            administered medications Complications:            No immediate complications. Estimated Blood Loss:     Estimated blood loss was minimal. Procedure:                Pre-Anesthesia Assessment:                           - The anesthesia plan was to use monitored                            anesthesia care (MAC).                           After obtaining informed consent, the endoscope was                            passed under direct vision. Throughout the                            procedure, the patient's blood pressure, pulse, and                            oxygen saturations were monitored continuously. The                            [id number]-[id number]) scope was introduced through the                            mouth, and advanced to the second part of duodenum.                            The upper GI endoscopy was accomplished without                            difficulty. The  patient tolerated the procedure                            well. Scope In: 10:05:46 AM Scope Out: 10:09:33 AM Total Procedure Duration: 0 hours 3 minutes 47 seconds  Findings:      There is no endoscopic evidence of varices in the entire esophagus.      The Z-line was irregular and was found 41 cm from the incisors. Biopsies       were taken with a cold forceps for  histology.      There is no endoscopic evidence of varices in the entire examined       stomach.      The entire examined stomach was normal.      The duodenal bulb, first portion of the duodenum and second portion of       the duodenum were normal. Impression:               - Z-line irregular, 41 cm from the incisors.                            Biopsied.                           - Normal stomach.                           - Normal duodenal bulb, first portion of the                            duodenum and second portion of the duodenum. Moderate Sedation:      Per Anesthesia Care Recommendation:           - Patient has a contact number available for                            emergencies. The signs and symptoms of potential                            delayed complications were discussed with the                            patient. Return to normal activities tomorrow.                            Written discharge instructions were provided to the                            patient.                           - Resume previous diet.                           - Continue present medications.                           - Await pathology results.                           -  Use Protonix (pantoprazole) 40 mg PO daily.                           - Return to GI clinic in 3 months. Procedure Code(s):        --- Professional ---                           [phone], Esophagogastroduodenoscopy, flexible,                            transoral; with biopsy, single or multiple Diagnosis Code(s):        --- Professional ---                           K22.89, Other specified disease of esophagus                           K76.6, Portal hypertension                           R93.3, Abnormal findings on diagnostic imaging of                            other parts of digestive tract CPT copyright 2022 [employer]. All rights reserved. The codes documented in this report are preliminary and upon  coder review may  be revised to meet current compliance requirements. Elon Alas. Abbey Chatters, DO [place] Abbey Chatters, DO 03/12/2023 10:12:23 AM This report has been signed electronically. Number of Addenda: 0

## 2023-03-13 ENCOUNTER — Other Ambulatory Visit: Payer: Self-pay

## 2023-03-13 ENCOUNTER — Inpatient Hospital Stay: Payer: Medicare Other

## 2023-03-13 ENCOUNTER — Inpatient Hospital Stay: Payer: Medicare Other | Attending: Hematology

## 2023-03-13 VITALS — BP 140/85 | HR 53 | Temp 97.4°F | Resp 15 | Wt 183.7 lb

## 2023-03-13 DIAGNOSIS — Z5112 Encounter for antineoplastic immunotherapy: Secondary | ICD-10-CM | POA: Diagnosis not present

## 2023-03-13 DIAGNOSIS — C7802 Secondary malignant neoplasm of left lung: Secondary | ICD-10-CM | POA: Diagnosis not present

## 2023-03-13 DIAGNOSIS — Z5111 Encounter for antineoplastic chemotherapy: Secondary | ICD-10-CM | POA: Insufficient documentation

## 2023-03-13 DIAGNOSIS — C7801 Secondary malignant neoplasm of right lung: Secondary | ICD-10-CM | POA: Insufficient documentation

## 2023-03-13 DIAGNOSIS — C7972 Secondary malignant neoplasm of left adrenal gland: Secondary | ICD-10-CM | POA: Diagnosis not present

## 2023-03-13 DIAGNOSIS — E876 Hypokalemia: Secondary | ICD-10-CM

## 2023-03-13 DIAGNOSIS — C180 Malignant neoplasm of cecum: Secondary | ICD-10-CM | POA: Diagnosis not present

## 2023-03-13 DIAGNOSIS — Z95828 Presence of other vascular implants and grafts: Secondary | ICD-10-CM

## 2023-03-13 DIAGNOSIS — C189 Malignant neoplasm of colon, unspecified: Secondary | ICD-10-CM | POA: Diagnosis not present

## 2023-03-13 LAB — CBC WITH DIFFERENTIAL/PLATELET
Abs Immature Granulocytes: 0.02 10*3/uL (ref 0.00–0.07)
Basophils Absolute: 0.1 10*3/uL (ref 0.0–0.1)
Basophils Relative: 1 %
Eosinophils Absolute: 0.1 10*3/uL (ref 0.0–0.5)
Eosinophils Relative: 3 %
HCT: 37.6 % — ABNORMAL LOW (ref 39.0–52.0)
Hemoglobin: 12.2 g/dL — ABNORMAL LOW (ref 13.0–17.0)
Immature Granulocytes: 0 %
Lymphocytes Relative: 54 %
Lymphs Abs: 2.6 10*3/uL (ref 0.7–4.0)
MCH: 31.8 pg (ref 26.0–34.0)
MCHC: 32.4 g/dL (ref 30.0–36.0)
MCV: 97.9 fL (ref 80.0–100.0)
Monocytes Absolute: 0.7 10*3/uL (ref 0.1–1.0)
Monocytes Relative: 14 %
Neutro Abs: 1.4 10*3/uL — ABNORMAL LOW (ref 1.7–7.7)
Neutrophils Relative %: 28 %
Platelets: 121 10*3/uL — ABNORMAL LOW (ref 150–400)
RBC: 3.84 MIL/uL — ABNORMAL LOW (ref 4.22–5.81)
RDW: 15.4 % (ref 11.5–15.5)
WBC: 4.8 10*3/uL (ref 4.0–10.5)
nRBC: 0 % (ref 0.0–0.2)

## 2023-03-13 LAB — COMPREHENSIVE METABOLIC PANEL
ALT: 9 U/L (ref 0–44)
AST: 19 U/L (ref 15–41)
Albumin: 3.7 g/dL (ref 3.5–5.0)
Alkaline Phosphatase: 37 U/L — ABNORMAL LOW (ref 38–126)
Anion gap: 9 (ref 5–15)
BUN: 8 mg/dL (ref 8–23)
CO2: 25 mmol/L (ref 22–32)
Calcium: 8.9 mg/dL (ref 8.9–10.3)
Chloride: 104 mmol/L (ref 98–111)
Creatinine, Ser: 1.06 mg/dL (ref 0.61–1.24)
GFR, Estimated: >60 mL/min (ref >60)
Glucose, Bld: 108 mg/dL — ABNORMAL HIGH (ref 70–99)
Potassium: 3.3 mmol/L — ABNORMAL LOW (ref 3.5–5.1)
Sodium: 138 mmol/L (ref 135–145)
Total Bilirubin: 1 mg/dL (ref 0.3–1.2)
Total Protein: 7.2 g/dL (ref 6.5–8.1)

## 2023-03-13 LAB — MAGNESIUM: Magnesium: 2 mg/dL (ref 1.7–2.4)

## 2023-03-13 MED ORDER — FLUOROURACIL CHEMO INJECTION 2.5 GM/50ML
400.0000 mg/m2 | Freq: Once | INTRAVENOUS | Status: AC
  Administered 2023-03-13: 800 mg via INTRAVENOUS
  Filled 2023-03-13: qty 16

## 2023-03-13 MED ORDER — SODIUM CHLORIDE 0.9% FLUSH
10.0000 mL | Freq: Once | INTRAVENOUS | Status: AC
  Administered 2023-03-13: 10 mL via INTRAVENOUS

## 2023-03-13 MED ORDER — SODIUM CHLORIDE 0.9 % IV SOLN
2400.0000 mg/m2 | INTRAVENOUS | Status: DC
  Administered 2023-03-13: 5000 mg via INTRAVENOUS
  Filled 2023-03-13: qty 100

## 2023-03-13 MED ORDER — SODIUM CHLORIDE 0.9 % IV SOLN: Freq: Once | INTRAVENOUS | Status: AC

## 2023-03-13 MED ORDER — SODIUM CHLORIDE 0.9 % IV SOLN
10.0000 mg | Freq: Once | INTRAVENOUS | Status: AC
  Administered 2023-03-13: 10 mg via INTRAVENOUS
  Filled 2023-03-13: qty 1

## 2023-03-13 MED ORDER — SODIUM CHLORIDE 0.9 % IV SOLN
5.0000 mg/kg | Freq: Once | INTRAVENOUS | Status: AC
  Administered 2023-03-13: 400 mg via INTRAVENOUS
  Filled 2023-03-13: qty 16

## 2023-03-13 MED ORDER — SODIUM CHLORIDE 0.9 % IV SOLN
400.0000 mg/m2 | Freq: Once | INTRAVENOUS | Status: AC
  Administered 2023-03-13: 780 mg via INTRAVENOUS
  Filled 2023-03-13: qty 39

## 2023-03-13 MED ORDER — POTASSIUM CHLORIDE CRYS ER 20 MEQ PO TBCR
40.0000 meq | EXTENDED_RELEASE_TABLET | Freq: Once | ORAL | Status: AC
  Administered 2023-03-13: 40 meq via ORAL
  Filled 2023-03-13: qty 2

## 2023-03-13 MED ORDER — PALONOSETRON HCL INJECTION 0.25 MG/5ML
0.2500 mg | Freq: Once | INTRAVENOUS | Status: AC
  Administered 2023-03-13: 0.25 mg via INTRAVENOUS
  Filled 2023-03-13: qty 5

## 2023-03-13 NOTE — Progress Notes (Signed)
Patient tolerated treatment well with no complaints voiced.  Patient left ambulatory in stable condition.  Vital signs stable at discharge. Home infusion 5FU pump connected with no issues. Follow up as scheduled.

## 2023-03-13 NOTE — Progress Notes (Signed)
Patient presents today for chemotherapy infusion.  Patient is in satisfactory condition with no new complaints voiced.  Vital signs are stable.  Labs reviewed and ANC 1.4, MD aware, potassium 3.3, standing order potassium 40 MeQ given. All other labs are within treatment parameters.  We will proceed with treatment per MD orders.

## 2023-03-13 NOTE — Patient Instructions (Signed)
[facility]  Discharge Instructions: Thank you for choosing [facility] to provide your oncology and hematology care.  If you have a lab appointment with the [facility], please come in thru the Main Entrance and check in at the main information desk.  Wear comfortable clothing and clothing appropriate for easy access to any Portacath or PICC line.   We strive to give you quality time with your provider. You may need to reschedule your appointment if you arrive late (15 or more minutes).  Arriving late affects you and other patients whose appointments are after yours.  Also, if you miss three or more appointments without notifying the office, you may be dismissed from the clinic at the provider's discretion.      For prescription refill requests, have your pharmacy contact our office and allow 72 hours for refills to be completed.    Today you received the following chemotherapy and/or immunotherapy agents MVASI, Leucovorin, 5FU.  Fluorouracil Injection What is this medication? FLUOROURACIL (flure oh YOOR a sil) treats some types of cancer. It works by slowing down the growth of cancer cells. This medicine may be used for other purposes; ask your health care provider or pharmacist if you have questions. COMMON BRAND NAME(S): Adrucil What should I tell my care team before I take this medication? They need to know if you have any of these conditions: Blood disorders Dihydropyrimidine dehydrogenase (DPD) deficiency Infection, such as chickenpox, cold sores, herpes Kidney disease Liver disease Poor nutrition Recent or ongoing radiation therapy An unusual or allergic reaction to fluorouracil, other medications, foods, dyes, or preservatives If you or your partner are pregnant or trying to get pregnant Breast-feeding How should I use this medication? This medication is injected into a vein. It is administered by your care team in a hospital or clinic  setting. Talk to your care team about the use of this medication in children. Special care may be needed. Overdosage: If you think you have taken too much of this medicine contact a poison control center or emergency room at once. NOTE: This medicine is only for you. Do not share this medicine with others. What if I miss a dose? Keep appointments for follow-up doses. It is important not to miss your dose. Call your care team if you are unable to keep an appointment. What may interact with this medication? Do not take this medication with any of the following: Live virus vaccines This medication may also interact with the following: Medications that treat or prevent blood clots, such as warfarin, enoxaparin, dalteparin This list may not describe all possible interactions. Give your health care provider a list of all the medicines, herbs, non-prescription drugs, or dietary supplements you use. Also tell them if you smoke, drink alcohol, or use illegal drugs. Some items may interact with your medicine. What should I watch for while using this medication? Your condition will be monitored carefully while you are receiving this medication. This medication may make you feel generally unwell. This is not uncommon as chemotherapy can affect healthy cells as well as cancer cells. Report any side effects. Continue your course of treatment even though you feel ill unless your care team tells you to stop. In some cases, you may be given additional medications to help with side effects. Follow all directions for their use. This medication may increase your risk of getting an infection. Call your care team for advice if you get a fever, chills, sore throat, or  other symptoms of a cold or flu. Do not treat yourself. Try to avoid being around people who are sick. This medication may increase your risk to bruise or bleed. Call your care team if you notice any unusual bleeding. Be careful brushing or flossing your  teeth or using a toothpick because you may get an infection or bleed more easily. If you have any dental work done, tell your dentist you are receiving this medication. Avoid taking medications that contain aspirin, acetaminophen, ibuprofen, naproxen, or ketoprofen unless instructed by your care team. These medications may hide a fever. Do not treat diarrhea with over the counter products. Contact your care team if you have diarrhea that lasts more than 2 days or if it is severe and watery. This medication can make you more sensitive to the sun. Keep out of the sun. If you cannot avoid being in the sun, wear protective clothing and sunscreen. Do not use sun lamps, tanning beds, or tanning booths. Talk to your care team if you or your partner wish to become pregnant or think you might be pregnant. This medication can cause serious birth defects if taken during pregnancy and for 3 months after the last dose. A reliable form of contraception is recommended while taking this medication and for 3 months after the last dose. Talk to your care team about effective forms of contraception. Do not father a child while taking this medication and for 3 months after the last dose. Use a condom while having sex during this time period. Do not breastfeed while taking this medication. This medication may cause infertility. Talk to your care team if you are concerned about your fertility. What side effects may I notice from receiving this medication? Side effects that you should report to your care team as soon as possible: Allergic reactions--skin rash, itching, hives, swelling of the face, lips, tongue, or throat Heart attack--pain or tightness in the chest, shoulders, arms, or jaw, nausea, shortness of breath, cold or clammy skin, feeling faint or lightheaded Heart failure--shortness of breath, swelling of the ankles, feet, or hands, sudden weight gain, unusual weakness or fatigue Heart rhythm changes--fast or  irregular heartbeat, dizziness, feeling faint or lightheaded, chest pain, trouble breathing High ammonia level--unusual weakness or fatigue, confusion, loss of appetite, nausea, vomiting, seizures Infection--fever, chills, cough, sore throat, wounds that don't heal, pain or trouble when passing urine, general feeling of discomfort or being unwell Low red blood cell level--unusual weakness or fatigue, dizziness, headache, trouble breathing Pain, tingling, or numbness in the hands or feet, muscle weakness, change in vision, confusion or trouble speaking, loss of balance or coordination, trouble walking, seizures Redness, swelling, and blistering of the skin over hands and feet Severe or prolonged diarrhea Unusual bruising or bleeding Side effects that usually do not require medical attention (report to your care team if they continue or are bothersome): Dry skin Headache Increased tears Nausea Pain, redness, or swelling with sores inside the mouth or throat Sensitivity to light Vomiting This list may not describe all possible side effects. Call your doctor for medical advice about side effects. You may report side effects to FDA at 1-800-FDA-1088. Where should I keep my medication? This medication is given in a hospital or clinic. It will not be stored at home. NOTE: This sheet is a summary. It may not cover all possible information. If you have questions about this medicine, talk to your doctor, pharmacist, or health care provider.  2023 Elsevier/Gold Standard (2022-03-28 00:00:00) Leucovorin Injection  What is this medication? LEUCOVORIN (loo koe VOR in) prevents side effects from certain medications, such as methotrexate. It works by increasing folate levels. This helps protect healthy cells in your body. It may also be used to treat anemia caused by low levels of folate. It can also be used with fluorouracil, a type of chemotherapy, to treat colorectal cancer. It works by increasing the  effects of fluorouracil in the body. This medicine may be used for other purposes; ask your health care provider or pharmacist if you have questions. What should I tell my care team before I take this medication? They need to know if you have any of these conditions: Anemia from low levels of vitamin B12 in the blood An unusual or allergic reaction to leucovorin, folic acid, other medications, foods, dyes, or preservatives Pregnant or trying to get pregnant Breastfeeding How should I use this medication? This medication is injected into a vein or a muscle. It is given by your care team in a hospital or clinic setting. Talk to your care team about the use of this medication in children. Special care may be needed. Overdosage: If you think you have taken too much of this medicine contact a poison control center or emergency room at once. NOTE: This medicine is only for you. Do not share this medicine with others. What if I miss a dose? Keep appointments for follow-up doses. It is important not to miss your dose. Call your care team if you are unable to keep an appointment. What may interact with this medication? Capecitabine Fluorouracil Phenobarbital Phenytoin Primidone Trimethoprim;sulfamethoxazole This list may not describe all possible interactions. Give your health care provider a list of all the medicines, herbs, non-prescription drugs, or dietary supplements you use. Also tell them if you smoke, drink alcohol, or use illegal drugs. Some items may interact with your medicine. What should I watch for while using this medication? Your condition will be monitored carefully while you are receiving this medication. This medication may increase the side effects of 5-fluorouracil. Tell your care team if you have diarrhea or mouth sores that do not get better or that get worse. What side effects may I notice from receiving this medication? Side effects that you should report to your care team  as soon as possible: Allergic reactions--skin rash, itching, hives, swelling of the face, lips, tongue, or throat This list may not describe all possible side effects. Call your doctor for medical advice about side effects. You may report side effects to FDA at 1-800-FDA-1088. Where should I keep my medication? This medication is given in a hospital or clinic. It will not be stored at home. NOTE: This sheet is a summary. It may not cover all possible information. If you have questions about this medicine, talk to your doctor, pharmacist, or health care provider.  2023 Elsevier/Gold Standard (2022-04-07 00:00:00) Bevacizumab Injection What is this medication? BEVACIZUMAB (be va SIZ yoo mab) treats some types of cancer. It works by blocking a protein that causes cancer cells to grow and multiply. This helps to slow or stop the spread of cancer cells. It is a monoclonal antibody. This medicine may be used for other purposes; ask your health care provider or pharmacist if you have questions. COMMON BRAND NAME(S): Alymsys, Avastin, MVASI, Noah Charon What should I tell my care team before I take this medication? They need to know if you have any of these conditions: Blood clots Coughing up blood Having or recent surgery Heart failure High blood  pressure History of a connection between 2 or more body parts that do not usually connect (fistula) History of a tear in your stomach or intestines Protein in your urine An unusual or allergic reaction to bevacizumab, other medications, foods, dyes, or preservatives Pregnant or trying to get pregnant Breast-feeding How should I use this medication? This medication is injected into a vein. It is given by your care team in a hospital or clinic setting. Talk to your care team the use of this medication in children. Special care may be needed. Overdosage: If you think you have taken too much of this medicine contact a poison control center or emergency room at  once. NOTE: This medicine is only for you. Do not share this medicine with others. What if I miss a dose? Keep appointments for follow-up doses. It is important not to miss your dose. Call your care team if you are unable to keep an appointment. What may interact with this medication? Interactions are not expected. This list may not describe all possible interactions. Give your health care provider a list of all the medicines, herbs, non-prescription drugs, or dietary supplements you use. Also tell them if you smoke, drink alcohol, or use illegal drugs. Some items may interact with your medicine. What should I watch for while using this medication? Your condition will be monitored carefully while you are receiving this medication. You may need blood work while taking this medication. This medication may make you feel generally unwell. This is not uncommon as chemotherapy can affect healthy cells as well as cancer cells. Report any side effects. Continue your course of treatment even though you feel ill unless your care team tells you to stop. This medication may increase your risk to bruise or bleed. Call your care team if you notice any unusual bleeding. Before having surgery, talk to your care team to make sure it is ok. This medication can increase the risk of poor healing of your surgical site or wound. You will need to stop this medication for 28 days before surgery. After surgery, wait at least 28 days before restarting this medication. Make sure the surgical site or wound is healed enough before restarting this medication. Talk to your care team if questions. Talk to your care team if you may be pregnant. Serious birth defects can occur if you take this medication during pregnancy and for 6 months after the last dose. Contraception is recommended while taking this medication and for 6 months after the last dose. Your care team can help you find the option that works for you. Do not breastfeed  while taking this medication and for 6 months after the last dose. This medication can cause infertility. Talk to your care team if you are concerned about your fertility. What side effects may I notice from receiving this medication? Side effects that you should report to your care team as soon as possible: Allergic reactions--skin rash, itching, hives, swelling of the face, lips, tongue, or throat Bleeding--bloody or black, tar-like stools, vomiting blood or brown material that looks like coffee grounds, red or dark brown urine, small red or purple spots on skin, unusual bruising or bleeding Blood clot--pain, swelling, or warmth in the leg, shortness of breath, chest pain Heart attack--pain or tightness in the chest, shoulders, arms, or jaw, nausea, shortness of breath, cold or clammy skin, feeling faint or lightheaded Heart failure--shortness of breath, swelling of the ankles, feet, or hands, sudden weight gain, unusual weakness or fatigue Increase in  blood pressure Infection--fever, chills, cough, sore throat, wounds that don't heal, pain or trouble when passing urine, general feeling of discomfort or being unwell Infusion reactions--chest pain, shortness of breath or trouble breathing, feeling faint or lightheaded Kidney injury--decrease in the amount of urine, swelling of the ankles, hands, or feet Stomach pain that is severe, does not go away, or gets worse Stroke--sudden numbness or weakness of the face, arm, or leg, trouble speaking, confusion, trouble walking, loss of balance or coordination, dizziness, severe headache, change in vision Sudden and severe headache, confusion, change in vision, seizures, which may be signs of posterior reversible encephalopathy syndrome (PRES) Side effects that usually do not require medical attention (report to your care team if they continue or are bothersome): Back pain Change in taste Diarrhea Dry skin Increased tears Nosebleed This list may not  describe all possible side effects. Call your doctor for medical advice about side effects. You may report side effects to FDA at 1-800-FDA-1088. Where should I keep my medication? This medication is given in a hospital or clinic. It will not be stored at home. NOTE: This sheet is a summary. It may not cover all possible information. If you have questions about this medicine, talk to your doctor, pharmacist, or health care provider.  2023 Elsevier/Gold Standard (2022-03-31 00:00:00)       To help prevent nausea and vomiting after your treatment, we encourage you to take your nausea medication as directed.  BELOW ARE SYMPTOMS THAT SHOULD BE REPORTED IMMEDIATELY: *FEVER GREATER THAN 100.4 F (38 C) OR HIGHER *CHILLS OR SWEATING *NAUSEA AND VOMITING THAT IS NOT CONTROLLED WITH YOUR NAUSEA MEDICATION *UNUSUAL SHORTNESS OF BREATH *UNUSUAL BRUISING OR BLEEDING *URINARY PROBLEMS (pain or burning when urinating, or frequent urination) *BOWEL PROBLEMS (unusual diarrhea, constipation, pain near the anus) TENDERNESS IN MOUTH AND THROAT WITH OR WITHOUT PRESENCE OF ULCERS (sore throat, sores in mouth, or a toothache) UNUSUAL RASH, SWELLING OR PAIN  UNUSUAL VAGINAL DISCHARGE OR ITCHING   Items with * indicate a potential emergency and should be followed up as soon as possible or go to the Emergency Department if any problems should occur.  Please show the CHEMOTHERAPY ALERT CARD or IMMUNOTHERAPY ALERT CARD at check-in to the Emergency Department and triage nurse.  Should you have questions after your visit or need to cancel or reschedule your appointment, please contact [facility] [phone]  and follow the prompts.  Office hours are 8:00 a.m. to 4:30 p.m. Monday - Friday. Please note that voicemails left after 4:00 p.m. may not be returned until the following business day.  We are closed weekends and major holidays. You have access to a nurse at all times for urgent questions.  Please call the main number to the clinic [phone] and follow the prompts.  For any non-urgent questions, you may also contact your provider using MyChart. We now offer e-Visits for anyone 28 and older to request care online for non-urgent symptoms. For details visit [email or web address].   Also download the MyChart app! Go to the app store, search "MyChart", open the app, select [facility], and log in with your MyChart username and password.

## 2023-03-13 NOTE — Addendum Note (Signed)
Addended by: Derek Jack on: 03/13/2023 10:31 AM   Modules accepted: Orders

## 2023-03-15 ENCOUNTER — Other Ambulatory Visit: Payer: Self-pay

## 2023-03-15 ENCOUNTER — Inpatient Hospital Stay: Payer: Medicare Other

## 2023-03-15 VITALS — BP 123/91 | HR 77 | Temp 97.7°F | Resp 18

## 2023-03-15 DIAGNOSIS — C7972 Secondary malignant neoplasm of left adrenal gland: Secondary | ICD-10-CM | POA: Diagnosis not present

## 2023-03-15 DIAGNOSIS — Z5111 Encounter for antineoplastic chemotherapy: Secondary | ICD-10-CM | POA: Diagnosis not present

## 2023-03-15 DIAGNOSIS — C7801 Secondary malignant neoplasm of right lung: Secondary | ICD-10-CM | POA: Diagnosis not present

## 2023-03-15 DIAGNOSIS — C180 Malignant neoplasm of cecum: Secondary | ICD-10-CM

## 2023-03-15 DIAGNOSIS — C7802 Secondary malignant neoplasm of left lung: Secondary | ICD-10-CM | POA: Diagnosis not present

## 2023-03-15 DIAGNOSIS — Z5112 Encounter for antineoplastic immunotherapy: Secondary | ICD-10-CM | POA: Diagnosis not present

## 2023-03-15 DIAGNOSIS — Z95828 Presence of other vascular implants and grafts: Secondary | ICD-10-CM

## 2023-03-15 MED ORDER — SODIUM CHLORIDE 0.9% FLUSH
10.0000 mL | INTRAVENOUS | Status: DC | PRN
  Administered 2023-03-15: 10 mL

## 2023-03-15 MED ORDER — HEPARIN SOD (PORK) LOCK FLUSH 100 UNIT/ML IV SOLN
500.0000 [IU] | Freq: Once | INTRAVENOUS | Status: AC | PRN
  Administered 2023-03-15: 500 [IU]

## 2023-03-15 NOTE — Anesthesia Postprocedure Evaluation (Signed)
Anesthesia Post Note  Patient: Evan [name]  Procedure(s) Performed: ESOPHAGOGASTRODUODENOSCOPY (EGD) WITH PROPOFOL ESOPHAGEAL BANDING BIOPSY  Patient location during evaluation: Phase II Anesthesia Type: General Level of consciousness: awake Pain management: pain level controlled Vital Signs Assessment: post-procedure vital signs reviewed and stable Respiratory status: spontaneous breathing and respiratory function stable Cardiovascular status: blood pressure returned to baseline and stable Postop Assessment: no headache and no apparent nausea or vomiting Anesthetic complications: no Comments: Late entry   No notable events documented.   Last Vitals:  Vitals:   03/12/23 0820 03/12/23 1014  BP:  108/74  Pulse: (!) 54 (!) 58  Resp:  15  Temp:  36.7 C  SpO2:  98%    Last Pain:  Vitals:   03/13/23 1000  TempSrc:   PainSc: 0-No pain                 Louann Sjogren

## 2023-03-15 NOTE — Patient Instructions (Signed)
[facility]  Discharge Instructions: Thank you for choosing [facility] to provide your oncology and hematology care.  If you have a lab appointment with the [facility] - please note that after April 8th, 2024, all labs will be drawn in the cancer center.  You do not have to check in or register with the main entrance as you have in the past but will complete your check-in in the cancer center.  Wear comfortable clothing and clothing appropriate for easy access to any Portacath or PICC line.   We strive to give you quality time with your provider. You may need to reschedule your appointment if you arrive late (15 or more minutes).  Arriving late affects you and other patients whose appointments are after yours.  Also, if you miss three or more appointments without notifying the office, you may be dismissed from the clinic at the provider's discretion.      For prescription refill requests, have your pharmacy contact our office and allow 72 hours for refills to be completed.    Today you received the following chemotherapy and/or immunotherapy agents Adrucil. Fluorouracil Injection What is this medication? FLUOROURACIL (flure oh YOOR a sil) treats some types of cancer. It works by slowing down the growth of cancer cells. This medicine may be used for other purposes; ask your health care provider or pharmacist if you have questions. COMMON BRAND NAME(S): Adrucil What should I tell my care team before I take this medication? They need to know if you have any of these conditions: Blood disorders Dihydropyrimidine dehydrogenase (DPD) deficiency Infection, such as chickenpox, cold sores, herpes Kidney disease Liver disease Poor nutrition Recent or ongoing radiation therapy An unusual or allergic reaction to fluorouracil, other medications, foods, dyes, or preservatives If you or your partner are pregnant or trying to get pregnant Breast-feeding How should  I use this medication? This medication is injected into a vein. It is administered by your care team in a hospital or clinic setting. Talk to your care team about the use of this medication in children. Special care may be needed. Overdosage: If you think you have taken too much of this medicine contact a poison control center or emergency room at once. NOTE: This medicine is only for you. Do not share this medicine with others. What if I miss a dose? Keep appointments for follow-up doses. It is important not to miss your dose. Call your care team if you are unable to keep an appointment. What may interact with this medication? Do not take this medication with any of the following: Live virus vaccines This medication may also interact with the following: Medications that treat or prevent blood clots, such as warfarin, enoxaparin, dalteparin This list may not describe all possible interactions. Give your health care provider a list of all the medicines, herbs, non-prescription drugs, or dietary supplements you use. Also tell them if you smoke, drink alcohol, or use illegal drugs. Some items may interact with your medicine. What should I watch for while using this medication? Your condition will be monitored carefully while you are receiving this medication. This medication may make you feel generally unwell. This is not uncommon as chemotherapy can affect healthy cells as well as cancer cells. Report any side effects. Continue your course of treatment even though you feel ill unless your care team tells you to stop. In some cases, you may be given additional medications to help with side effects. Follow all directions  for their use. This medication may increase your risk of getting an infection. Call your care team for advice if you get a fever, chills, sore throat, or other symptoms of a cold or flu. Do not treat yourself. Try to avoid being around people who are sick. This medication may increase  your risk to bruise or bleed. Call your care team if you notice any unusual bleeding. Be careful brushing or flossing your teeth or using a toothpick because you may get an infection or bleed more easily. If you have any dental work done, tell your dentist you are receiving this medication. Avoid taking medications that contain aspirin, acetaminophen, ibuprofen, naproxen, or ketoprofen unless instructed by your care team. These medications may hide a fever. Do not treat diarrhea with over the counter products. Contact your care team if you have diarrhea that lasts more than 2 days or if it is severe and watery. This medication can make you more sensitive to the sun. Keep out of the sun. If you cannot avoid being in the sun, wear protective clothing and sunscreen. Do not use sun lamps, tanning beds, or tanning booths. Talk to your care team if you or your partner wish to become pregnant or think you might be pregnant. This medication can cause serious birth defects if taken during pregnancy and for 3 months after the last dose. A reliable form of contraception is recommended while taking this medication and for 3 months after the last dose. Talk to your care team about effective forms of contraception. Do not father a child while taking this medication and for 3 months after the last dose. Use a condom while having sex during this time period. Do not breastfeed while taking this medication. This medication may cause infertility. Talk to your care team if you are concerned about your fertility. What side effects may I notice from receiving this medication? Side effects that you should report to your care team as soon as possible: Allergic reactions--skin rash, itching, hives, swelling of the face, lips, tongue, or throat Heart attack--pain or tightness in the chest, shoulders, arms, or jaw, nausea, shortness of breath, cold or clammy skin, feeling faint or lightheaded Heart failure--shortness of breath,  swelling of the ankles, feet, or hands, sudden weight gain, unusual weakness or fatigue Heart rhythm changes--fast or irregular heartbeat, dizziness, feeling faint or lightheaded, chest pain, trouble breathing High ammonia level--unusual weakness or fatigue, confusion, loss of appetite, nausea, vomiting, seizures Infection--fever, chills, cough, sore throat, wounds that don't heal, pain or trouble when passing urine, general feeling of discomfort or being unwell Low red blood cell level--unusual weakness or fatigue, dizziness, headache, trouble breathing Pain, tingling, or numbness in the hands or feet, muscle weakness, change in vision, confusion or trouble speaking, loss of balance or coordination, trouble walking, seizures Redness, swelling, and blistering of the skin over hands and feet Severe or prolonged diarrhea Unusual bruising or bleeding Side effects that usually do not require medical attention (report to your care team if they continue or are bothersome): Dry skin Headache Increased tears Nausea Pain, redness, or swelling with sores inside the mouth or throat Sensitivity to light Vomiting This list may not describe all possible side effects. Call your doctor for medical advice about side effects. You may report side effects to FDA at 1-800-FDA-1088. Where should I keep my medication? This medication is given in a hospital or clinic. It will not be stored at home. NOTE: This sheet is a summary. It may not  cover all possible information. If you have questions about this medicine, talk to your doctor, pharmacist, or health care provider.  2023 Elsevier/Gold Standard (2022-03-28 00:00:00)       To help prevent nausea and vomiting after your treatment, we encourage you to take your nausea medication as directed.  BELOW ARE SYMPTOMS THAT SHOULD BE REPORTED IMMEDIATELY: *FEVER GREATER THAN 100.4 F (38 C) OR HIGHER *CHILLS OR SWEATING *NAUSEA AND VOMITING THAT IS NOT CONTROLLED  WITH YOUR NAUSEA MEDICATION *UNUSUAL SHORTNESS OF BREATH *UNUSUAL BRUISING OR BLEEDING *URINARY PROBLEMS (pain or burning when urinating, or frequent urination) *BOWEL PROBLEMS (unusual diarrhea, constipation, pain near the anus) TENDERNESS IN MOUTH AND THROAT WITH OR WITHOUT PRESENCE OF ULCERS (sore throat, sores in mouth, or a toothache) UNUSUAL RASH, SWELLING OR PAIN  UNUSUAL VAGINAL DISCHARGE OR ITCHING   Items with * indicate a potential emergency and should be followed up as soon as possible or go to the Emergency Department if any problems should occur.  Please show the CHEMOTHERAPY ALERT CARD or IMMUNOTHERAPY ALERT CARD at check-in to the Emergency Department and triage nurse.  Should you have questions after your visit or need to cancel or reschedule your appointment, please contact [facility] [phone]  and follow the prompts.  Office hours are 8:00 a.m. to 4:30 p.m. Monday - Friday. Please note that voicemails left after 4:00 p.m. may not be returned until the following business day.  We are closed weekends and major holidays. You have access to a nurse at all times for urgent questions. Please call the main number to the clinic [phone] and follow the prompts.  For any non-urgent questions, you may also contact your provider using MyChart. We now offer e-Visits for anyone 12 and older to request care online for non-urgent symptoms. For details visit [email or web address].   Also download the MyChart app! Go to the app store, search "MyChart", open the app, select [facility], and log in with your MyChart username and password.

## 2023-03-15 NOTE — Progress Notes (Signed)
Patient presents today for pump d/c. Vital signs are stable. Port a cath site clean, dry, and intact. Port flushed with 10 mls of Normal Saline and 500 Units of Heparin. Needle removed intact. Band aid applied. Patient has no complaints at this time. Discharged from clinic ambulatory in stable condition. Alert and oriented x 3. F/U with [facility] as scheduled.

## 2023-03-16 ENCOUNTER — Other Ambulatory Visit: Payer: Self-pay | Admitting: Hematology

## 2023-03-16 DIAGNOSIS — C180 Malignant neoplasm of cecum: Secondary | ICD-10-CM

## 2023-03-16 DIAGNOSIS — C189 Malignant neoplasm of colon, unspecified: Secondary | ICD-10-CM

## 2023-03-16 DIAGNOSIS — K5900 Constipation, unspecified: Secondary | ICD-10-CM

## 2023-03-16 MED ORDER — LACTULOSE 20 GM/30ML PO SOLN
10.0000 g | Freq: Every day | ORAL | 1 refills | Status: DC
Start: 2023-03-16 — End: 2023-05-23

## 2023-03-18 LAB — SURGICAL PATHOLOGY

## 2023-03-19 ENCOUNTER — Encounter (HOSPITAL_COMMUNITY): Payer: Self-pay | Admitting: Internal Medicine

## 2023-03-19 IMAGING — CT CT GUIDANCE NEEDLE PLACEMENT
1 of 3 series · 13 of 32 positions shown, 19 images · non-contrast
Comparison: CT chest, 12/16/2021.

INDICATION: LEFT lung mass.

[Series 2: i-spiral 5.0 b40f · axial · 0.98mm/px · z∈[+1252,+1501]mm · 13 of 83 slices shown, 19 images]
[im 6/83  soft-tissue]
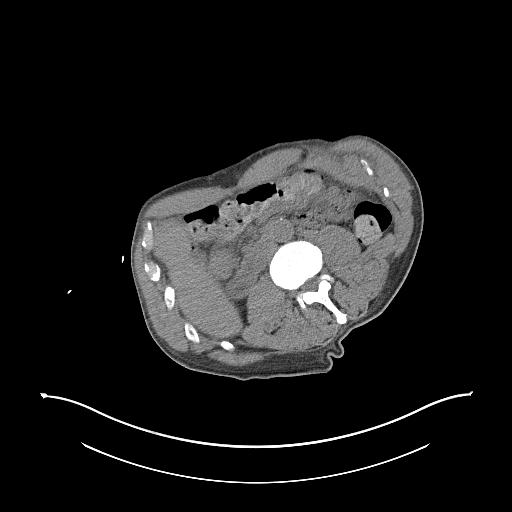
[im 6/83  bone]
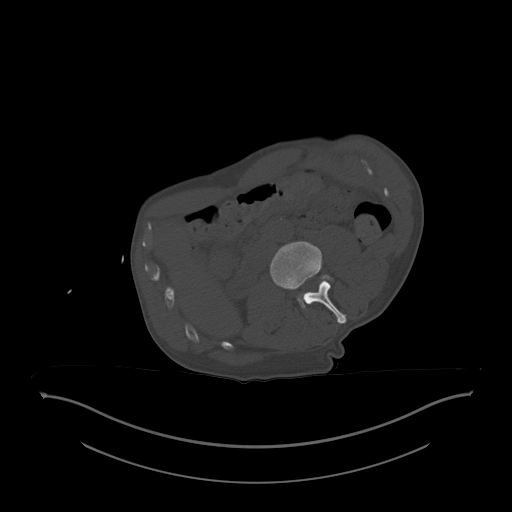
[im 12/83  soft-tissue]
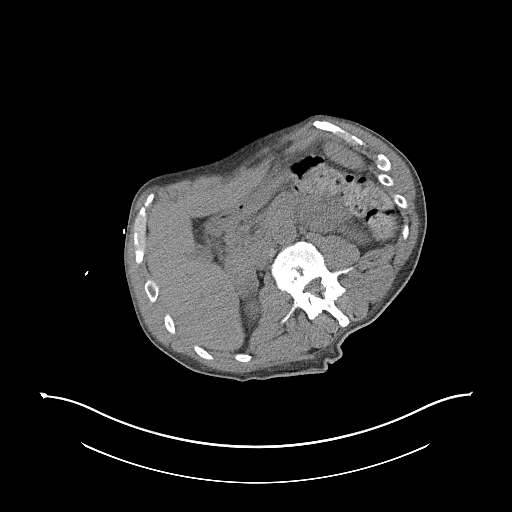
[im 18/83  soft-tissue]
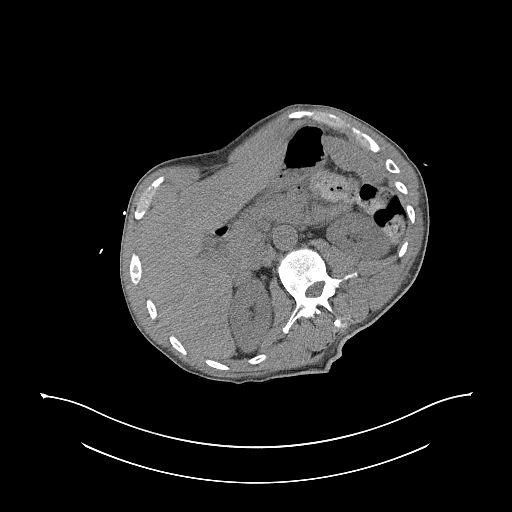
[im 24/83  soft-tissue]
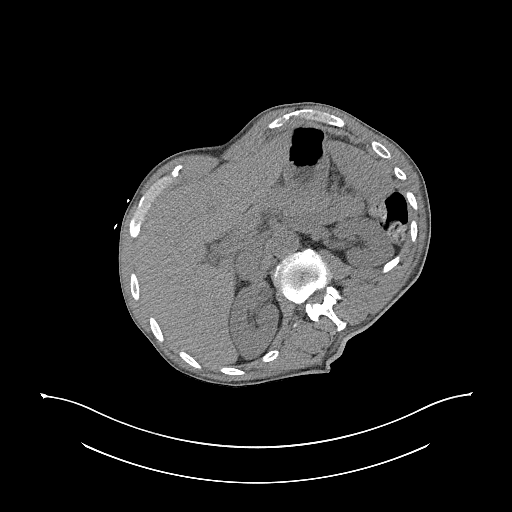
[im 30/83  soft-tissue]
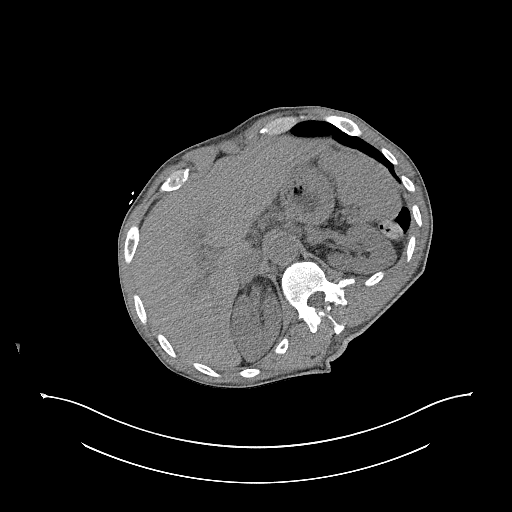
[im 36/83  soft-tissue]
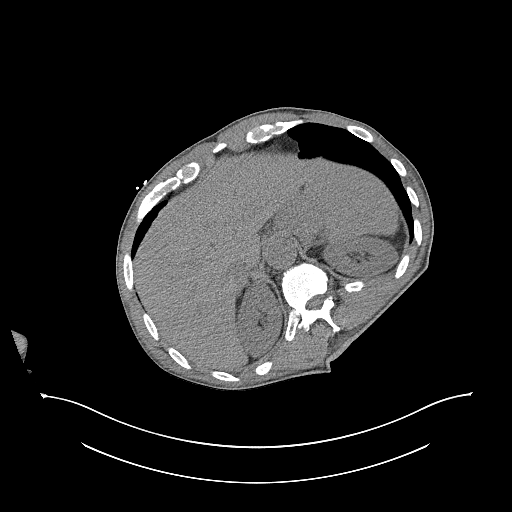
[im 42/83  soft-tissue]
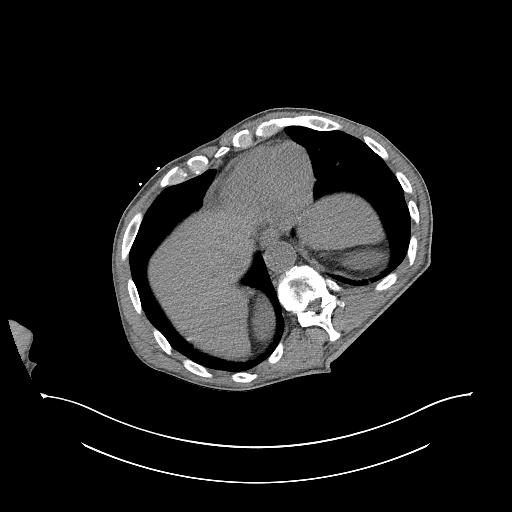
[im 47/83  soft-tissue]
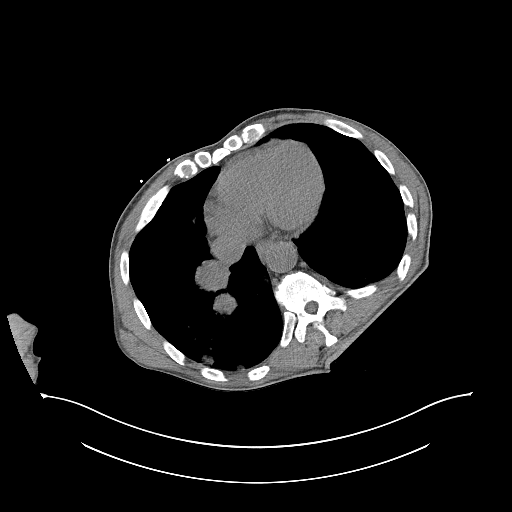
[im 53/83  soft-tissue]
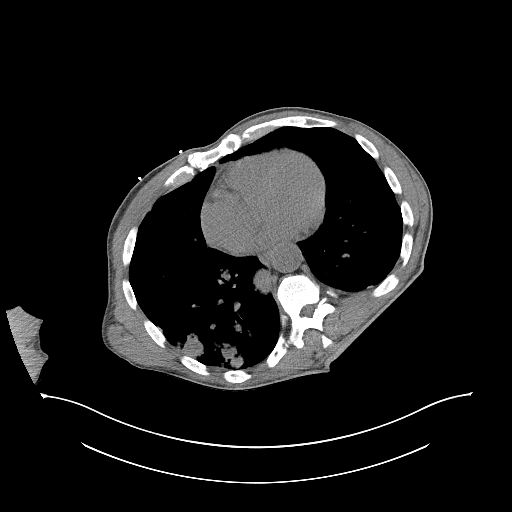
[im 53/83  bone]
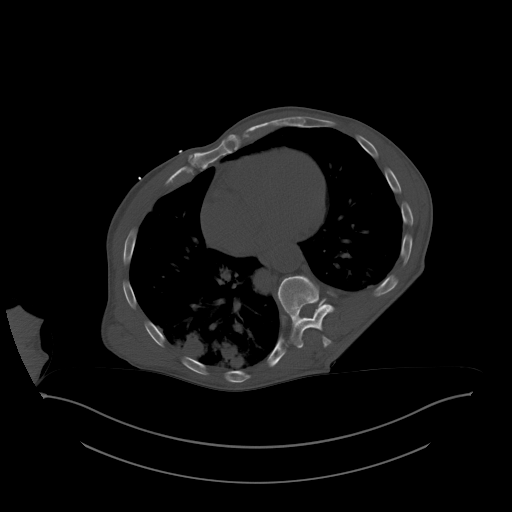
[im 59/83  soft-tissue]
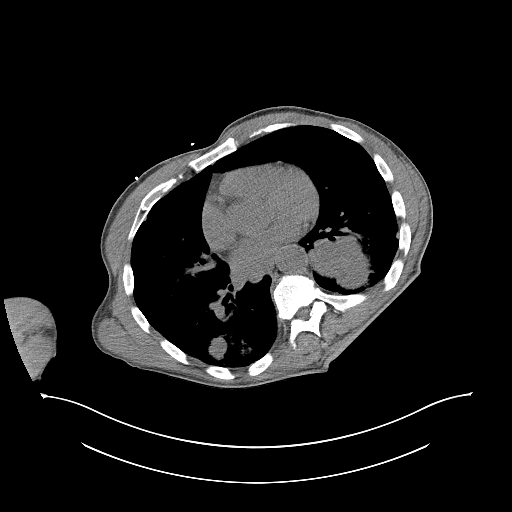
[im 59/83  lung]
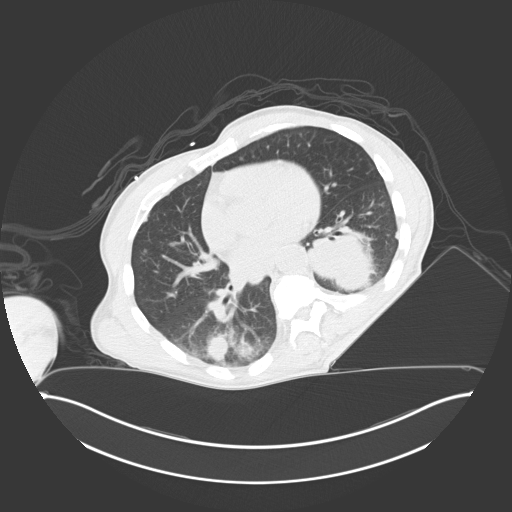
[im 65/83  soft-tissue]
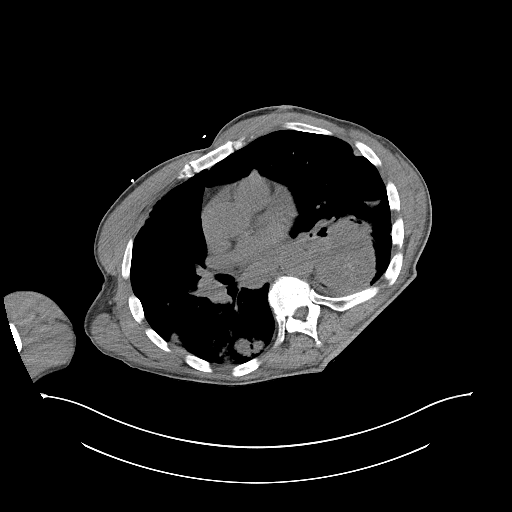
[im 65/83  lung]
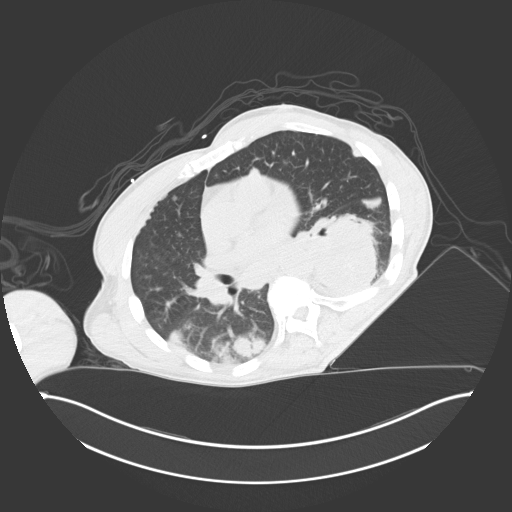
[im 71/83  soft-tissue]
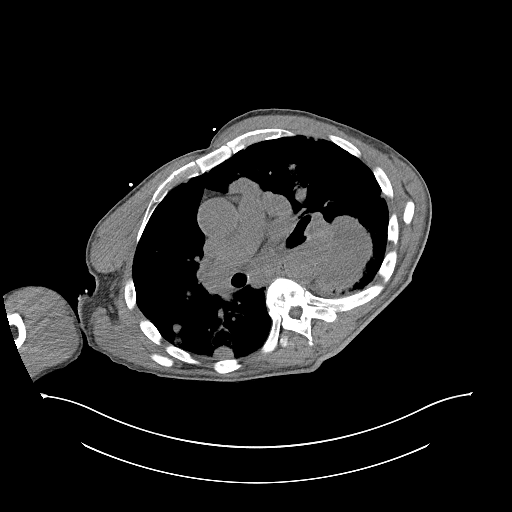
[im 71/83  lung]
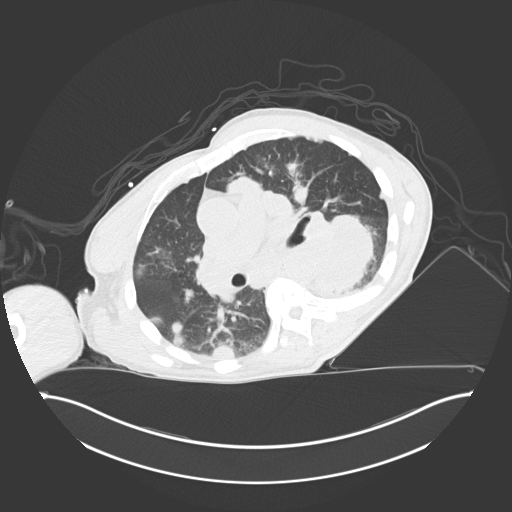
[im 77/83  soft-tissue]
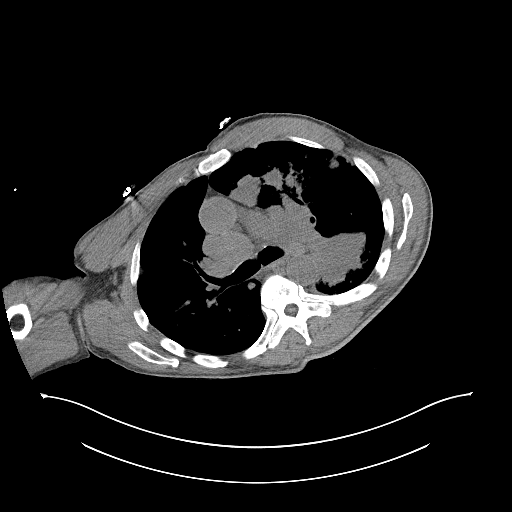
[im 77/83  lung]
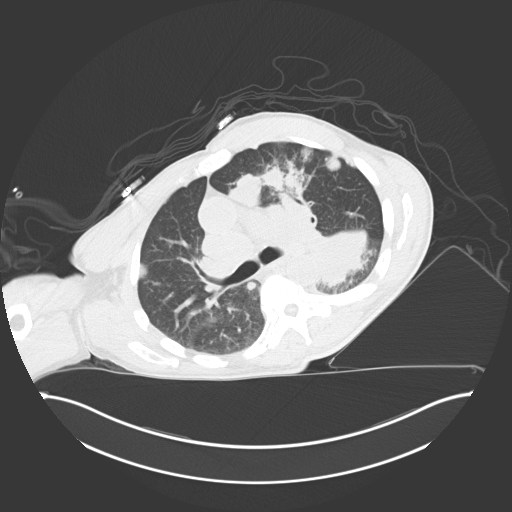

[13 of 32 positions shown; findings below may reference images not displayed]

EXAM:
CT GUIDANCE NEEDLE PLACEMENT

Procedure:

CT-GUIDED LEFT HILAR LUNG MASS BIOPSY

RADIATION DOSE REDUCTION: This exam was performed according to the
departmental dose-optimization program which includes automated
exposure control, adjustment of the mA and/or kV according to
patient size and/or use of iterative reconstruction technique.
MEDICATIONS:
None.

ANESTHESIA/SEDATION:
Fentanyl 100 mcg IV; Versed 2 mg IV

Sedation time: 12 minutes; The patient was continuously monitored
during the procedure by the interventional radiology nurse under my
direct supervision.

CONTRAST:  None.

COMPLICATIONS:
None immediate.

PROCEDURE:
Informed consent was obtained from the patient following an
explanation of the procedure, risks, benefits and alternatives. A
time out was performed prior to the initiation of the procedure.

The patient was positioned supine on the CT table and a limited CT
was performed for procedural planning demonstrating LEFT hilar lung
mass. The procedure was planned. The operative site was prepped and
draped in the usual sterile fashion. Appropriate trajectory was
confirmed with a 22 gauge spinal needle after the adjacent tissues
were anesthetized with 1% Lidocaine with epinephrine.

Under intermittent CT guidance, a 17 gauge coaxial needle was
advanced into the peripheral aspect of the mass.

Appropriate positioning was confirmed and 3 samples were obtained
with an 18 gauge core needle biopsy device. The co-axial needle was
removed and hemostasis was achieved with hemostatic patch.

A limited postprocedural CT was negative for pneumothorax, pulmonary
hemorrhage or additional complication. A dressing was placed. The
patient tolerated the procedure well without immediate
postprocedural complication.
IMPRESSION: Successful CT guided core needle biopsy of LEFT hilar lung mass, as
above.

## 2023-03-25 ENCOUNTER — Other Ambulatory Visit: Payer: Self-pay

## 2023-03-26 DIAGNOSIS — C189 Malignant neoplasm of colon, unspecified: Secondary | ICD-10-CM | POA: Diagnosis not present

## 2023-03-26 NOTE — Progress Notes (Incomplete)
[facility] [address], [place] [address]    Clinic Day:  03/26/2023  Referring physician: [name], PA  Patient Care Team: [name], [name] as PCP - General (Physician Assistant) [name], MD as Medical Oncologist (Medical Oncology) Therese Sarah, RN as Oncology Nurse Navigator (Medical Oncology)   ASSESSMENT & PLAN:   Assessment: Left lung mass with multiple lung nodules: - Presentation with dry cough for 6 months. - 30 pound weight loss in the last couple of years, weight stable over the last 6 months. - CT chest with contrast on 12/16/2021 showed bulky left hilar mass measuring 8.1 x 7.5 cm.  Numerous bilateral lung nodules of varying sizes.  Left adrenal nodule measuring 2.8 x 2.2 cm.  Mediastinal and bilateral hilar adenopathy with the largest pretracheal node measuring 4.1 x 3.1 cm. - MRI of the brain from 01/04/2022 which was negative for metastatic disease. - CTAP from 01/03/2022 which showed isolated left adrenal metastasis with no other evidence of metastatic disease in the abdomen or pelvis. - Pathology of left lung biopsy which shows adenocarcinoma with necrosis.  CK20 positive and CDX2 positive but negative for CK7 indicating colonic primary. 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  HER2 negative.  TMB low.  MSI-stable.  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  Other targetable mutations negative. - FOLFIRI started on 02/22/2022, bevacizumab added with cycle 4  - CT CAP (05/17/2022): Mediastinal and hilar lymph nodes have decreased in size.  Largest perihilar left lower lobe lung mass has decreased in size.  Right upper lobe mass slightly increased in size.  Other nodules are stable.  Left adrenal mass decreased to 1.3 cm from 2.8 cm. - CT scan showed mixed response as it was compared to CT from 12/16/2021.  He did not start chemotherapy until 02/22/2022.   Social/family history: - Lives by himself.  He paints houses for living. - He quit  smoking 12 years back and started back again 1 and half year ago and smoked half pack per day.  He quit again about 1 week ago. - Father had cancer, type unknown to the patient.  Brother died of brain tumor.  3.  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  Margins negative.  No lymphovascular or perineural invasion. - Received 3 cycles of XELOX followed by Xeloda for total of 6 months.  Oxaliplatin discontinued during cycle 4 secondary to transaminitis, elevated bilirubin and thrombocytopenia.  However he was also treated for hep C with Harvoni after that.  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  Hilar and mediastinal lymph nodes stable.  Stable left adrenal nodule.  No new findings. - As he had varices on CT scan, we have referred to GI.  He was evaluated by Dr. Marletta Lor and is having EGD and possible banding on 03/12/2023. - Reviewed labs today which showed normal LFTs.  CBC was grossly normal.  Last CEA was 44.5.  UA was negative for protein. - Continue 5-FU and bevacizumab maintenance every 2 weeks.  RTC 4 weeks for follow-up.  Will check CEA at next visit.  2.  Lower rib/epigastric pain: - Continue oxycodone 10 mg every 4 hours as needed.  Pain is well-controlled.  3.  Difficulty falling asleep: - Continue trazodone as needed.  4.  Hypertension: - Continue  amlodipine 10 mg daily.  Blood pressure today is 120/82.  Orders Placed This Encounter  Procedures   CEA    Standing Status:   Future    Standing Expiration Date:   03/25/2024       I,Alexis Herring,acting as a scribe for [name], MD.,have documented all relevant documentation on the behalf of [name], MD,as directed by  [name], MD while in the presence of [name], MD.  ***   Jon Gills Herring   4/15/20245:52  PM  CHIEF COMPLAINT:   Diagnosis: metastatic colon cancer to the lungs and left adrenal gland    Cancer Staging  Cecal cancer Staging form: Colon and Rectum, AJCC 8th Edition - Clinical stage from 10/07/2019: Stage IIIB (cT3, cN1, cM0) - Signed by [name], MD on 10/07/2019 - Pathologic stage from 01/23/2022: Stage IVB (rpTX, pN0, pM1b) - Unsigned    Prior Therapy: none  Current Therapy:  K-ras G12 D mutation.  HER2 negative.  TMB low.  MSI-stable.  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  Other targetable mutations negative.    HISTORY OF PRESENT ILLNESS:   Oncology History  Cecal cancer  10/07/2019 Initial Diagnosis   Cecal cancer (HCC)   10/07/2019 Cancer Staging   Staging form: Colon and Rectum, AJCC 8th Edition - Clinical stage from 10/07/2019: Stage IIIB (cT3, cN1, cM0) - Signed by [name], MD on 10/07/2019   02/22/2022 - 08/08/2022 Chemotherapy   Patient is on Treatment Plan : COLORECTAL FOLFIRI / BEVACIZUMAB Q14D     02/22/2022 -  Chemotherapy   Patient is on Treatment Plan : COLORECTAL FOLFIRI + Bevacizumab q14d        INTERVAL HISTORY:   Tymel is a 67 y.o. male presenting to clinic today for follow up of metastatic colon cancer to the lungs and left adrenal gland.He was last seen by me on 02/27/23.  Patient had an EGD on 03/12/23. Biopsy negative for metaplasia or dysplasia.  Today, he states that he is doing well overall. His appetite level is at ***%. His energy level is at ***%.  PAST MEDICAL HISTORY:   Past Medical History: Past Medical History:  Diagnosis Date   Arthritis    Colon cancer    colon   Hepatitis C    Hypertension    Port-A-Cath in place 02/15/2022    Surgical History: Past Surgical History:  Procedure Laterality Date   BIOPSY  03/12/2023   Procedure: BIOPSY;  Surgeon: Lanelle Bal, DO;  Location: [facility];  Service: Endoscopy;;   COLON SURGERY     ESOPHAGEAL BANDING N/A 03/12/2023   Procedure: ESOPHAGEAL  BANDING;  Surgeon: Lanelle Bal, DO;  Location: [facility];  Service: Endoscopy;  Laterality: N/A;   ESOPHAGOGASTRODUODENOSCOPY (EGD) WITH PROPOFOL N/A 03/12/2023   Procedure: ESOPHAGOGASTRODUODENOSCOPY (EGD) WITH PROPOFOL;  Surgeon: Lanelle Bal, DO;  Location: [facility];  Service: Endoscopy;  Laterality: N/A;  11:30AM; ASA 3   PORTACATH PLACEMENT Right 02/08/2022   Procedure: INSERTION PORT-A-CATH- RIJ;  Surgeon: Lewie Chamber, DO;  Location: [facility];  Service: General;  Laterality: Right;   REPLACEMENT TOTAL KNEE Left    SHOULDER ARTHROSCOPY Bilateral    TOTAL HIP ARTHROPLASTY Right 11/09/2020   Procedure: TOTAL HIP ARTHROPLASTY ANTERIOR APPROACH;  Surgeon: Sheral Apley, MD;  Location: [facility];  Service: Orthopedics;  Laterality: Right;   WRIST SURGERY Left     Social History: Social History   Socioeconomic History   Marital status: Married  Spouse name: Not on file   Number of children: 7   Years of education: Not on file   Highest education level: Not on file  Occupational History   Occupation: employed  Tobacco Use   Smoking status: Former    Packs/day: 1.50    Years: 20.00    Additional pack years: 0.00    Total pack years: 30.00    Types: Cigarettes    Quit date: 11/19/2005    Years since quitting: 17.3   Smokeless tobacco: Never  Vaping Use   Vaping Use: Never used  Substance and Sexual Activity   Alcohol use: Not Currently   Drug use: Never   Sexual activity: Yes  Other Topics Concern   Not on file  Social History Narrative   ** Merged History Encounter **       Separated from wife 11/2021   Social Determinants of Health   Financial Resource Strain: Low Risk  (10/07/2019)   Overall Financial Resource Strain (CARDIA)    Difficulty of Paying Living Expenses: Not very hard  Food Insecurity: No Food Insecurity (10/07/2019)   Hunger Vital Sign    Worried About Running Out of Food in the Last Year: Never true    Ran Out of Food in  the Last Year: Never true  Transportation Needs: No Transportation Needs (10/07/2019)   PRAPARE - Administrator, Civil Service (Medical): No    Lack of Transportation (Non-Medical): No  Physical Activity: Inactive (10/07/2019)   Exercise Vital Sign    Days of Exercise per Week: 0 days    Minutes of Exercise per Session: 0 min  Stress: No Stress Concern Present (10/07/2019)   [employer] of Occupational Health - Occupational Stress Questionnaire    Feeling of Stress : Not at all  Social Connections: Moderately Integrated (10/07/2019)   Social Connection and Isolation Panel [NHANES]    Frequency of Communication with Friends and Family: Once a week    Frequency of Social Gatherings with Friends and Family: Once a week    Attends Religious Services: More than 4 times per year    Active Member of [employer] or Organizations: Yes    Attends Engineer, structural: More than 4 times per year    Marital Status: Married  Catering manager Violence: Not At Risk (10/07/2019)   Humiliation, Afraid, Rape, and Kick questionnaire    Fear of Current or Ex-Partner: No    Emotionally Abused: No    Physically Abused: No    Sexually Abused: No    Family History: Family History  Problem Relation Age of Onset   Heart disease Mother    Dementia Father    Heart disease Sister    Cancer Brother    Hypertension Brother    Hypertension Brother    Healthy Son    Healthy Son    Healthy Son    Healthy Son    Healthy Daughter    Healthy Daughter    Healthy Daughter     Current Medications:  Current Outpatient Medications:    aluminum-magnesium hydroxide-simethicone (MAALOX) 200-200-20 MG/5ML SUSP, Take 30 mLs by mouth 4 (four) times daily -  before meals and at bedtime., Disp: 1680 mL, Rfl: 2   amLODipine (NORVASC) 10 MG tablet, Take 1 tablet (10 mg total) by mouth daily., Disp: 90 tablet, Rfl: 3   Bevacizumab (AVASTIN IV), Inject into the vein every 14 (fourteen) days. *start  date TBD, Disp: , Rfl:    fluorouracil CALGB  09811 2,400 mg/m2 in sodium chloride 0.9 % 150 mL, Inject 2,400 mg/m2 into the vein over 48 hr., Disp: , Rfl:    FLUOROURACIL IV, Inject into the vein every 14 (fourteen) days., Disp: , Rfl:    Lactulose 20 GM/30ML SOLN, Take 15 mLs (10 g total) by mouth at bedtime. Take 15 ml at bedtime every night to assist with regular bowel movements.  Titrate down if having multiple bowel movements.  If a bowel movement has not occurred in 3 to 4 days or longer, then take 15 ml every 3 hours until a bowel movent has occurred., Disp: 450 mL, Rfl: 1   LEUCOVORIN CALCIUM IV, Inject into the vein every 14 (fourteen) days., Disp: , Rfl:    Oxycodone HCl 10 MG TABS, TAKE ONE TABLET BY MOUTH EVERY 4 HOURS AS NEEDED, Disp: 168 tablet, Rfl: 0   pantoprazole (PROTONIX) 40 MG tablet, Take 1 tablet (40 mg total) by mouth daily., Disp: 30 tablet, Rfl: 11   Allergies: No Known Allergies  REVIEW OF SYSTEMS:   Review of Systems  Constitutional:  Negative for chills, fatigue and fever.  HENT:   Negative for lump/mass, mouth sores, nosebleeds, sore throat and trouble swallowing.   Eyes:  Negative for eye problems.  Respiratory:  Negative for cough and shortness of breath.   Cardiovascular:  Negative for chest pain, leg swelling and palpitations.  Gastrointestinal:  Negative for abdominal pain, constipation, diarrhea, nausea and vomiting.  Genitourinary:  Negative for bladder incontinence, difficulty urinating, dysuria, frequency, hematuria and nocturia.   Musculoskeletal:  Negative for arthralgias, back pain, flank pain, myalgias and neck pain.  Skin:  Negative for itching and rash.  Neurological:  Negative for dizziness, headaches and numbness.  Hematological:  Does not bruise/bleed easily.  Psychiatric/Behavioral:  Negative for depression, sleep disturbance and suicidal ideas. The patient is not nervous/anxious.   All other systems reviewed and are negative.    VITALS:    There were no vitals taken for this visit.  Wt Readings from Last 3 Encounters:  03/13/23 183 lb 11.2 oz (83.3 kg)  03/12/23 183 lb 10.3 oz (83.3 kg)  02/27/23 183 lb 11.2 oz (83.3 kg)    There is no height or weight on file to calculate BMI.  Performance status (ECOG): 1 - Symptomatic but completely ambulatory  PHYSICAL EXAM:   Physical Exam Vitals and nursing note reviewed. Exam conducted with a chaperone present.  Constitutional:      Appearance: Normal appearance.  Cardiovascular:     Rate and Rhythm: Normal rate and regular rhythm.     Pulses: Normal pulses.     Heart sounds: Normal heart sounds.  Pulmonary:     Effort: Pulmonary effort is normal.     Breath sounds: Normal breath sounds.  Abdominal:     Palpations: Abdomen is soft. There is no hepatomegaly, splenomegaly or mass.     Tenderness: There is no abdominal tenderness.  Musculoskeletal:     Right lower leg: No edema.     Left lower leg: No edema.  Lymphadenopathy:     Cervical: No cervical adenopathy.     Right cervical: No superficial, deep or posterior cervical adenopathy.    Left cervical: No superficial, deep or posterior cervical adenopathy.     Upper Body:     Right upper body: No supraclavicular or axillary adenopathy.     Left upper body: No supraclavicular or axillary adenopathy.  Neurological:     General: No focal deficit present.  Mental Status: He is alert and oriented to person, place, and time.  Psychiatric:        Mood and Affect: Mood normal.        Behavior: Behavior normal.     LABS:      Latest Ref Rng & Units 03/13/2023    9:01 AM 02/27/2023    8:36 AM 02/13/2023    9:17 AM  CBC  WBC 4.0 - 10.5 K/uL 4.8  4.8  4.4   Hemoglobin 13.0 - 17.0 g/dL 16.1  09.6  04.5   Hematocrit 39.0 - 52.0 % 37.6  37.9  36.7   Platelets 150 - 400 K/uL 121  147  144       Latest Ref Rng & Units 03/13/2023    9:01 AM 02/27/2023    8:36 AM 02/13/2023    9:17 AM  CMP  Glucose 70 - 99 mg/dL 409  811   914   BUN 8 - 23 mg/dL 8  10  11    Creatinine 0.61 - 1.24 mg/dL 7.82  9.56  2.13   Sodium 135 - 145 mmol/L 138  136  137   Potassium 3.5 - 5.1 mmol/L 3.3  3.5  3.5   Chloride 98 - 111 mmol/L 104  103  103   CO2 22 - 32 mmol/L 25  23  26    Calcium 8.9 - 10.3 mg/dL 8.9  8.8  9.3   Total Protein 6.5 - 8.1 g/dL 7.2  7.5  7.3   Total Bilirubin 0.3 - 1.2 mg/dL 1.0  1.2  0.8   Alkaline Phos 38 - 126 U/L 37  35  40   AST 15 - 41 U/L 19  23  21    ALT 0 - 44 U/L 9  8  8       Lab Results  Component Value Date   CEA1 44.5 (H) 01/16/2023   /  CEA  Date Value Ref Range Status  01/16/2023 44.5 (H) 0.0 - 4.7 ng/mL Final    Comment:    (NOTE)                             Nonsmokers          <3.9                             Smokers             <5.6 Roche [employer] Electrochemiluminescence Immunoassay (ECLIA) Values obtained with different assay methods or kits cannot be used interchangeably.  Results cannot be interpreted as absolute evidence of the presence or absence of malignant disease. Performed At: [facility] [address] [place], [place] [id number] Jolene Schimke MD GE:[id number]    No results found for: "PSA1" No results found for: "CAN199" No results found for: "CAN125"  No results found for: "TOTALPROTELP", "ALBUMINELP", "A1GS", "A2GS", "BETS", "BETA2SER", "GAMS", "MSPIKE", "SPEI" Lab Results  Component Value Date   TIBC 406 01/16/2023   TIBC 294 06/06/2022   TIBC 237 (L) 02/22/2022   FERRITIN 150 01/16/2023   FERRITIN 243 06/06/2022   FERRITIN 292 02/22/2022   IRONPCTSAT 18 01/16/2023   IRONPCTSAT 24 06/06/2022   IRONPCTSAT 11 (L) 02/22/2022   Lab Results  Component Value Date   LDH 258 (H) 12/26/2021     STUDIES:   No results found.

## 2023-03-27 ENCOUNTER — Inpatient Hospital Stay: Payer: Medicare Other

## 2023-03-27 ENCOUNTER — Inpatient Hospital Stay: Payer: Medicare Other | Admitting: Hematology

## 2023-03-27 DIAGNOSIS — C180 Malignant neoplasm of cecum: Secondary | ICD-10-CM

## 2023-04-10 ENCOUNTER — Inpatient Hospital Stay: Payer: Medicare Other

## 2023-04-10 VITALS — BP 163/86 | HR 56 | Temp 97.9°F | Resp 18

## 2023-04-10 DIAGNOSIS — C180 Malignant neoplasm of cecum: Secondary | ICD-10-CM

## 2023-04-10 DIAGNOSIS — C7972 Secondary malignant neoplasm of left adrenal gland: Secondary | ICD-10-CM | POA: Diagnosis not present

## 2023-04-10 DIAGNOSIS — C7801 Secondary malignant neoplasm of right lung: Secondary | ICD-10-CM | POA: Diagnosis not present

## 2023-04-10 DIAGNOSIS — Z5111 Encounter for antineoplastic chemotherapy: Secondary | ICD-10-CM | POA: Diagnosis not present

## 2023-04-10 DIAGNOSIS — Z95828 Presence of other vascular implants and grafts: Secondary | ICD-10-CM

## 2023-04-10 DIAGNOSIS — C189 Malignant neoplasm of colon, unspecified: Secondary | ICD-10-CM | POA: Diagnosis not present

## 2023-04-10 DIAGNOSIS — C7802 Secondary malignant neoplasm of left lung: Secondary | ICD-10-CM | POA: Diagnosis not present

## 2023-04-10 DIAGNOSIS — E876 Hypokalemia: Secondary | ICD-10-CM

## 2023-04-10 DIAGNOSIS — Z5112 Encounter for antineoplastic immunotherapy: Secondary | ICD-10-CM | POA: Diagnosis not present

## 2023-04-10 LAB — CBC WITH DIFFERENTIAL/PLATELET
Abs Immature Granulocytes: 0.04 10*3/uL (ref 0.00–0.07)
Basophils Absolute: 0.1 10*3/uL (ref 0.0–0.1)
Basophils Relative: 2 %
Eosinophils Absolute: 0.2 10*3/uL (ref 0.0–0.5)
Eosinophils Relative: 4 %
HCT: 37.4 % — ABNORMAL LOW (ref 39.0–52.0)
Hemoglobin: 12 g/dL — ABNORMAL LOW (ref 13.0–17.0)
Immature Granulocytes: 1 %
Lymphocytes Relative: 41 %
Lymphs Abs: 2.2 10*3/uL (ref 0.7–4.0)
MCH: 31.4 pg (ref 26.0–34.0)
MCHC: 32.1 g/dL (ref 30.0–36.0)
MCV: 97.9 fL (ref 80.0–100.0)
Monocytes Absolute: 0.8 10*3/uL (ref 0.1–1.0)
Monocytes Relative: 14 %
Neutro Abs: 2 10*3/uL (ref 1.7–7.7)
Neutrophils Relative %: 38 %
Platelets: 153 10*3/uL (ref 150–400)
RBC: 3.82 MIL/uL — ABNORMAL LOW (ref 4.22–5.81)
RDW: 14.8 % (ref 11.5–15.5)
WBC: 5.3 10*3/uL (ref 4.0–10.5)
nRBC: 0 % (ref 0.0–0.2)

## 2023-04-10 LAB — URINALYSIS, DIPSTICK ONLY
Bilirubin Urine: NEGATIVE
Glucose, UA: NEGATIVE mg/dL
Hgb urine dipstick: NEGATIVE
Ketones, ur: NEGATIVE mg/dL
Leukocytes,Ua: NEGATIVE
Nitrite: NEGATIVE
Protein, ur: NEGATIVE mg/dL
Specific Gravity, Urine: 1.017 (ref 1.005–1.030)
pH: 5 (ref 5.0–8.0)

## 2023-04-10 LAB — COMPREHENSIVE METABOLIC PANEL
ALT: 9 U/L (ref 0–44)
AST: 24 U/L (ref 15–41)
Albumin: 3.8 g/dL (ref 3.5–5.0)
Alkaline Phosphatase: 38 U/L (ref 38–126)
Anion gap: 9 (ref 5–15)
BUN: 9 mg/dL (ref 8–23)
CO2: 24 mmol/L (ref 22–32)
Calcium: 8.9 mg/dL (ref 8.9–10.3)
Chloride: 103 mmol/L (ref 98–111)
Creatinine, Ser: 0.91 mg/dL (ref 0.61–1.24)
GFR, Estimated: >60 mL/min (ref >60)
Glucose, Bld: 146 mg/dL — ABNORMAL HIGH (ref 70–99)
Potassium: 3.3 mmol/L — ABNORMAL LOW (ref 3.5–5.1)
Sodium: 136 mmol/L (ref 135–145)
Total Bilirubin: 1.1 mg/dL (ref 0.3–1.2)
Total Protein: 7.4 g/dL (ref 6.5–8.1)

## 2023-04-10 LAB — MAGNESIUM: Magnesium: 1.9 mg/dL (ref 1.7–2.4)

## 2023-04-10 MED ORDER — FLUOROURACIL CHEMO INJECTION 2.5 GM/50ML
400.0000 mg/m2 | Freq: Once | INTRAVENOUS | Status: AC
  Administered 2023-04-10: 800 mg via INTRAVENOUS
  Filled 2023-04-10: qty 16

## 2023-04-10 MED ORDER — SODIUM CHLORIDE 0.9 % IV SOLN
5.0000 mg/kg | Freq: Once | INTRAVENOUS | Status: AC
  Administered 2023-04-10: 400 mg via INTRAVENOUS
  Filled 2023-04-10: qty 16

## 2023-04-10 MED ORDER — SODIUM CHLORIDE 0.9 % IV SOLN
2400.0000 mg/m2 | INTRAVENOUS | Status: DC
  Administered 2023-04-10: 5000 mg via INTRAVENOUS
  Filled 2023-04-10: qty 100

## 2023-04-10 MED ORDER — POTASSIUM CHLORIDE CRYS ER 20 MEQ PO TBCR
40.0000 meq | EXTENDED_RELEASE_TABLET | Freq: Once | ORAL | Status: AC
  Administered 2023-04-10: 40 meq via ORAL
  Filled 2023-04-10: qty 2

## 2023-04-10 MED ORDER — SODIUM CHLORIDE 0.9 % IV SOLN
10.0000 mg | Freq: Once | INTRAVENOUS | Status: AC
  Administered 2023-04-10: 10 mg via INTRAVENOUS
  Filled 2023-04-10: qty 10

## 2023-04-10 MED ORDER — SODIUM CHLORIDE 0.9 % IV SOLN: Freq: Once | INTRAVENOUS | Status: AC

## 2023-04-10 MED ORDER — SODIUM CHLORIDE 0.9% FLUSH
10.0000 mL | Freq: Once | INTRAVENOUS | Status: AC
  Administered 2023-04-10: 10 mL via INTRAVENOUS

## 2023-04-10 MED ORDER — SODIUM CHLORIDE 0.9 % IV SOLN
400.0000 mg/m2 | Freq: Once | INTRAVENOUS | Status: AC
  Administered 2023-04-10: 780 mg via INTRAVENOUS
  Filled 2023-04-10: qty 39

## 2023-04-10 MED ORDER — PALONOSETRON HCL INJECTION 0.25 MG/5ML
0.2500 mg | Freq: Once | INTRAVENOUS | Status: AC
  Administered 2023-04-10: 0.25 mg via INTRAVENOUS
  Filled 2023-04-10: qty 5

## 2023-04-10 NOTE — Progress Notes (Signed)
Patient tolerated treatment well with no complaints voiced. Home 5FU pump connected with no issues. Patient left ambulatory in stable condition.  Vital signs stable at discharge.  Follow up as scheduled.

## 2023-04-10 NOTE — Progress Notes (Signed)
Patient presents today for chemotherapy infusion.  Patient is in satisfactory condition with no new complaints voiced.  Vital signs are stable.  Labs reviewed and all labs are within treatment parameters.  Potassium today is 3.3.  We will give Klor Con 40 mEq PO x one dose today per standing orders by Dr. Ellin Saba.  We will proceed with treatment per MD orders.

## 2023-04-10 NOTE — Patient Instructions (Signed)
[facility] [place]  Discharge Instructions: Thank you for choosing [facility] to provide your oncology and hematology care.  If you have a lab appointment with the Cancer Center - please note that after April 8th, 2024, all labs will be drawn in the cancer center.  You do not have to check in or register with the main entrance as you have in the past but will complete your check-in in the cancer center.  Wear comfortable clothing and clothing appropriate for easy access to any Portacath or PICC line.   We strive to give you quality time with your provider. You may need to reschedule your appointment if you arrive late (15 or more minutes).  Arriving late affects you and other patients whose appointments are after yours.  Also, if you miss three or more appointments without notifying the office, you may be dismissed from the clinic at the provider's discretion.      For prescription refill requests, have your pharmacy contact our office and allow 72 hours for refills to be completed.    Today you received the following chemotherapy and/or immunotherapy agents MVASI, leucovorin, Fluorouracil push, 5FU pump.   Fluorouracil Injection What is this medication? FLUOROURACIL (flure oh YOOR a sil) treats some types of cancer. It works by slowing down the growth of cancer cells. This medicine may be used for other purposes; ask your health care provider or pharmacist if you have questions. COMMON BRAND NAME(S): Adrucil What should I tell my care team before I take this medication? They need to know if you have any of these conditions: Blood disorders Dihydropyrimidine dehydrogenase (DPD) deficiency Infection, such as chickenpox, cold sores, herpes Kidney disease Liver disease Poor nutrition Recent or ongoing radiation therapy An unusual or allergic reaction to fluorouracil, other medications, foods, dyes, or preservatives If you or your partner are pregnant or trying to  get pregnant Breast-feeding How should I use this medication? This medication is injected into a vein. It is administered by your care team in a hospital or clinic setting. Talk to your care team about the use of this medication in children. Special care may be needed. Overdosage: If you think you have taken too much of this medicine contact a poison control center or emergency room at once. NOTE: This medicine is only for you. Do not share this medicine with others. What if I miss a dose? Keep appointments for follow-up doses. It is important not to miss your dose. Call your care team if you are unable to keep an appointment. What may interact with this medication? Do not take this medication with any of the following: Live virus vaccines This medication may also interact with the following: Medications that treat or prevent blood clots, such as warfarin, enoxaparin, dalteparin This list may not describe all possible interactions. Give your health care provider a list of all the medicines, herbs, non-prescription drugs, or dietary supplements you use. Also tell them if you smoke, drink alcohol, or use illegal drugs. Some items may interact with your medicine. What should I watch for while using this medication? Your condition will be monitored carefully while you are receiving this medication. This medication may make you feel generally unwell. This is not uncommon as chemotherapy can affect healthy cells as well as cancer cells. Report any side effects. Continue your course of treatment even though you feel ill unless your care team tells you to stop. In some cases, you may be given additional medications to  help with side effects. Follow all directions for their use. This medication may increase your risk of getting an infection. Call your care team for advice if you get a fever, chills, sore throat, or other symptoms of a cold or flu. Do not treat yourself. Try to avoid being around people who  are sick. This medication may increase your risk to bruise or bleed. Call your care team if you notice any unusual bleeding. Be careful brushing or flossing your teeth or using a toothpick because you may get an infection or bleed more easily. If you have any dental work done, tell your dentist you are receiving this medication. Avoid taking medications that contain aspirin, acetaminophen, ibuprofen, naproxen, or ketoprofen unless instructed by your care team. These medications may hide a fever. Do not treat diarrhea with over the counter products. Contact your care team if you have diarrhea that lasts more than 2 days or if it is severe and watery. This medication can make you more sensitive to the sun. Keep out of the sun. If you cannot avoid being in the sun, wear protective clothing and sunscreen. Do not use sun lamps, tanning beds, or tanning booths. Talk to your care team if you or your partner wish to become pregnant or think you might be pregnant. This medication can cause serious birth defects if taken during pregnancy and for 3 months after the last dose. A reliable form of contraception is recommended while taking this medication and for 3 months after the last dose. Talk to your care team about effective forms of contraception. Do not father a child while taking this medication and for 3 months after the last dose. Use a condom while having sex during this time period. Do not breastfeed while taking this medication. This medication may cause infertility. Talk to your care team if you are concerned about your fertility. What side effects may I notice from receiving this medication? Side effects that you should report to your care team as soon as possible: Allergic reactions--skin rash, itching, hives, swelling of the face, lips, tongue, or throat Heart attack--pain or tightness in the chest, shoulders, arms, or jaw, nausea, shortness of breath, cold or clammy skin, feeling faint or  lightheaded Heart failure--shortness of breath, swelling of the ankles, feet, or hands, sudden weight gain, unusual weakness or fatigue Heart rhythm changes--fast or irregular heartbeat, dizziness, feeling faint or lightheaded, chest pain, trouble breathing High ammonia level--unusual weakness or fatigue, confusion, loss of appetite, nausea, vomiting, seizures Infection--fever, chills, cough, sore throat, wounds that don't heal, pain or trouble when passing urine, general feeling of discomfort or being unwell Low red blood cell level--unusual weakness or fatigue, dizziness, headache, trouble breathing Pain, tingling, or numbness in the hands or feet, muscle weakness, change in vision, confusion or trouble speaking, loss of balance or coordination, trouble walking, seizures Redness, swelling, and blistering of the skin over hands and feet Severe or prolonged diarrhea Unusual bruising or bleeding Side effects that usually do not require medical attention (report to your care team if they continue or are bothersome): Dry skin Headache Increased tears Nausea Pain, redness, or swelling with sores inside the mouth or throat Sensitivity to light Vomiting This list may not describe all possible side effects. Call your doctor for medical advice about side effects. You may report side effects to FDA at 1-800-FDA-1088. Where should I keep my medication? This medication is given in a hospital or clinic. It will not be stored at home. NOTE: This  sheet is a summary. It may not cover all possible information. If you have questions about this medicine, talk to your doctor, pharmacist, or health care provider.  2023 Elsevier/Gold Standard (2022-03-28 00:00:00) Leucovorin Injection What is this medication? LEUCOVORIN (loo koe VOR in) prevents side effects from certain medications, such as methotrexate. It works by increasing folate levels. This helps protect healthy cells in your body. It may also be used to  treat anemia caused by low levels of folate. It can also be used with fluorouracil, a type of chemotherapy, to treat colorectal cancer. It works by increasing the effects of fluorouracil in the body. This medicine may be used for other purposes; ask your health care provider or pharmacist if you have questions. What should I tell my care team before I take this medication? They need to know if you have any of these conditions: Anemia from low levels of vitamin B12 in the blood An unusual or allergic reaction to leucovorin, folic acid, other medications, foods, dyes, or preservatives Pregnant or trying to get pregnant Breastfeeding How should I use this medication? This medication is injected into a vein or a muscle. It is given by your care team in a hospital or clinic setting. Talk to your care team about the use of this medication in children. Special care may be needed. Overdosage: If you think you have taken too much of this medicine contact a poison control center or emergency room at once. NOTE: This medicine is only for you. Do not share this medicine with others. What if I miss a dose? Keep appointments for follow-up doses. It is important not to miss your dose. Call your care team if you are unable to keep an appointment. What may interact with this medication? Capecitabine Fluorouracil Phenobarbital Phenytoin Primidone Trimethoprim;sulfamethoxazole This list may not describe all possible interactions. Give your health care provider a list of all the medicines, herbs, non-prescription drugs, or dietary supplements you use. Also tell them if you smoke, drink alcohol, or use illegal drugs. Some items may interact with your medicine. What should I watch for while using this medication? Your condition will be monitored carefully while you are receiving this medication. This medication may increase the side effects of 5-fluorouracil. Tell your care team if you have diarrhea or mouth  sores that do not get better or that get worse. What side effects may I notice from receiving this medication? Side effects that you should report to your care team as soon as possible: Allergic reactions--skin rash, itching, hives, swelling of the face, lips, tongue, or throat This list may not describe all possible side effects. Call your doctor for medical advice about side effects. You may report side effects to FDA at 1-800-FDA-1088. Where should I keep my medication? This medication is given in a hospital or clinic. It will not be stored at home. NOTE: This sheet is a summary. It may not cover all possible information. If you have questions about this medicine, talk to your doctor, pharmacist, or health care provider.  2023 Elsevier/Gold Standard (2022-04-07 00:00:00)   Bevacizumab Injection What is this medication? BEVACIZUMAB (be va SIZ yoo mab) treats some types of cancer. It works by blocking a protein that causes cancer cells to grow and multiply. This helps to slow or stop the spread of cancer cells. It is a monoclonal antibody. This medicine may be used for other purposes; ask your health care provider or pharmacist if you have questions. COMMON BRAND NAME(S): Alymsys, Avastin, MVASI,  Omer Jack What should I tell my care team before I take this medication? They need to know if you have any of these conditions: Blood clots Coughing up blood Having or recent surgery Heart failure High blood pressure History of a connection between 2 or more body parts that do not usually connect (fistula) History of a tear in your stomach or intestines Protein in your urine An unusual or allergic reaction to bevacizumab, other medications, foods, dyes, or preservatives Pregnant or trying to get pregnant Breast-feeding How should I use this medication? This medication is injected into a vein. It is given by your care team in a hospital or clinic setting. Talk to your care team the use of this  medication in children. Special care may be needed. Overdosage: If you think you have taken too much of this medicine contact a poison control center or emergency room at once. NOTE: This medicine is only for you. Do not share this medicine with others. What if I miss a dose? Keep appointments for follow-up doses. It is important not to miss your dose. Call your care team if you are unable to keep an appointment. What may interact with this medication? Interactions are not expected. This list may not describe all possible interactions. Give your health care provider a list of all the medicines, herbs, non-prescription drugs, or dietary supplements you use. Also tell them if you smoke, drink alcohol, or use illegal drugs. Some items may interact with your medicine. What should I watch for while using this medication? Your condition will be monitored carefully while you are receiving this medication. You may need blood work while taking this medication. This medication may make you feel generally unwell. This is not uncommon as chemotherapy can affect healthy cells as well as cancer cells. Report any side effects. Continue your course of treatment even though you feel ill unless your care team tells you to stop. This medication may increase your risk to bruise or bleed. Call your care team if you notice any unusual bleeding. Before having surgery, talk to your care team to make sure it is ok. This medication can increase the risk of poor healing of your surgical site or wound. You will need to stop this medication for 28 days before surgery. After surgery, wait at least 28 days before restarting this medication. Make sure the surgical site or wound is healed enough before restarting this medication. Talk to your care team if questions. Talk to your care team if you may be pregnant. Serious birth defects can occur if you take this medication during pregnancy and for 6 months after the last dose.  Contraception is recommended while taking this medication and for 6 months after the last dose. Your care team can help you find the option that works for you. Do not breastfeed while taking this medication and for 6 months after the last dose. This medication can cause infertility. Talk to your care team if you are concerned about your fertility. What side effects may I notice from receiving this medication? Side effects that you should report to your care team as soon as possible: Allergic reactions--skin rash, itching, hives, swelling of the face, lips, tongue, or throat Bleeding--bloody or black, tar-like stools, vomiting blood or brown material that looks like coffee grounds, red or dark brown urine, small red or purple spots on skin, unusual bruising or bleeding Blood clot--pain, swelling, or warmth in the leg, shortness of breath, chest pain Heart attack--pain or tightness in the  chest, shoulders, arms, or jaw, nausea, shortness of breath, cold or clammy skin, feeling faint or lightheaded Heart failure--shortness of breath, swelling of the ankles, feet, or hands, sudden weight gain, unusual weakness or fatigue Increase in blood pressure Infection--fever, chills, cough, sore throat, wounds that don't heal, pain or trouble when passing urine, general feeling of discomfort or being unwell Infusion reactions--chest pain, shortness of breath or trouble breathing, feeling faint or lightheaded Kidney injury--decrease in the amount of urine, swelling of the ankles, hands, or feet Stomach pain that is severe, does not go away, or gets worse Stroke--sudden numbness or weakness of the face, arm, or leg, trouble speaking, confusion, trouble walking, loss of balance or coordination, dizziness, severe headache, change in vision Sudden and severe headache, confusion, change in vision, seizures, which may be signs of posterior reversible encephalopathy syndrome (PRES) Side effects that usually do not require  medical attention (report to your care team if they continue or are bothersome): Back pain Change in taste Diarrhea Dry skin Increased tears Nosebleed This list may not describe all possible side effects. Call your doctor for medical advice about side effects. You may report side effects to FDA at 1-800-FDA-1088. Where should I keep my medication? This medication is given in a hospital or clinic. It will not be stored at home. NOTE: This sheet is a summary. It may not cover all possible information. If you have questions about this medicine, talk to your doctor, pharmacist, or health care provider.  2023 Elsevier/Gold Standard (2022-03-31 00:00:00)      To help prevent nausea and vomiting after your treatment, we encourage you to take your nausea medication as directed.  BELOW ARE SYMPTOMS THAT SHOULD BE REPORTED IMMEDIATELY: *FEVER GREATER THAN 100.4 F (38 C) OR HIGHER *CHILLS OR SWEATING *NAUSEA AND VOMITING THAT IS NOT CONTROLLED WITH YOUR NAUSEA MEDICATION *UNUSUAL SHORTNESS OF BREATH *UNUSUAL BRUISING OR BLEEDING *URINARY PROBLEMS (pain or burning when urinating, or frequent urination) *BOWEL PROBLEMS (unusual diarrhea, constipation, pain near the anus) TENDERNESS IN MOUTH AND THROAT WITH OR WITHOUT PRESENCE OF ULCERS (sore throat, sores in mouth, or a toothache) UNUSUAL RASH, SWELLING OR PAIN  UNUSUAL VAGINAL DISCHARGE OR ITCHING   Items with * indicate a potential emergency and should be followed up as soon as possible or go to the Emergency Department if any problems should occur.  Please show the CHEMOTHERAPY ALERT CARD or IMMUNOTHERAPY ALERT CARD at check-in to the Emergency Department and triage nurse.  Should you have questions after your visit or need to cancel or reschedule your appointment, please contact [facility] AT [facility] [phone]  and follow the prompts.  Office hours are 8:00 a.m. to 4:30 p.m. Monday - Friday. Please note that voicemails left  after 4:00 p.m. may not be returned until the following business day.  We are closed weekends and major holidays. You have access to a nurse at all times for urgent questions. Please call the main number to the clinic [phone] and follow the prompts.  For any non-urgent questions, you may also contact your provider using MyChart. We now offer e-Visits for anyone 41 and older to request care online for non-urgent symptoms. For details visit [email or web address].   Also download the MyChart app! Go to the app store, search "MyChart", open the app, select [place], and log in with your MyChart username and password.

## 2023-04-12 ENCOUNTER — Inpatient Hospital Stay: Payer: Medicare Other | Attending: Hematology

## 2023-04-12 VITALS — BP 159/90 | HR 76 | Temp 97.7°F | Resp 18

## 2023-04-12 DIAGNOSIS — C7801 Secondary malignant neoplasm of right lung: Secondary | ICD-10-CM | POA: Insufficient documentation

## 2023-04-12 DIAGNOSIS — I10 Essential (primary) hypertension: Secondary | ICD-10-CM | POA: Diagnosis not present

## 2023-04-12 DIAGNOSIS — Z5112 Encounter for antineoplastic immunotherapy: Secondary | ICD-10-CM | POA: Insufficient documentation

## 2023-04-12 DIAGNOSIS — Z79899 Other long term (current) drug therapy: Secondary | ICD-10-CM | POA: Diagnosis not present

## 2023-04-12 DIAGNOSIS — C7972 Secondary malignant neoplasm of left adrenal gland: Secondary | ICD-10-CM | POA: Insufficient documentation

## 2023-04-12 DIAGNOSIS — R1013 Epigastric pain: Secondary | ICD-10-CM | POA: Diagnosis not present

## 2023-04-12 DIAGNOSIS — Z5111 Encounter for antineoplastic chemotherapy: Secondary | ICD-10-CM | POA: Insufficient documentation

## 2023-04-12 DIAGNOSIS — D696 Thrombocytopenia, unspecified: Secondary | ICD-10-CM | POA: Diagnosis not present

## 2023-04-12 DIAGNOSIS — C7802 Secondary malignant neoplasm of left lung: Secondary | ICD-10-CM | POA: Diagnosis not present

## 2023-04-12 DIAGNOSIS — C180 Malignant neoplasm of cecum: Secondary | ICD-10-CM | POA: Diagnosis not present

## 2023-04-12 DIAGNOSIS — Z87891 Personal history of nicotine dependence: Secondary | ICD-10-CM | POA: Diagnosis not present

## 2023-04-12 DIAGNOSIS — Z95828 Presence of other vascular implants and grafts: Secondary | ICD-10-CM

## 2023-04-12 MED ORDER — HEPARIN SOD (PORK) LOCK FLUSH 100 UNIT/ML IV SOLN
500.0000 [IU] | Freq: Once | INTRAVENOUS | Status: AC | PRN
  Administered 2023-04-12: 500 [IU]

## 2023-04-12 MED ORDER — SODIUM CHLORIDE 0.9% FLUSH
10.0000 mL | INTRAVENOUS | Status: DC | PRN
  Administered 2023-04-12: 10 mL

## 2023-04-12 NOTE — Progress Notes (Signed)
Patient presents today for pump d/c.  Port flushed with good blood return noted. No bruising or swelling at site. Bandaid applied and patient discharged in satisfactory condition. VVS stable with no signs or symptoms of distressed noted.  

## 2023-04-12 NOTE — Patient Instructions (Signed)
[facility]  Discharge Instructions: Thank you for choosing [facility] to provide your oncology and hematology care.  If you have a lab appointment with the Cancer Center - please note that after April 8th, 2024, all labs will be drawn in the cancer center.  You do not have to check in or register with the main entrance as you have in the past but will complete your check-in in the cancer center.  Wear comfortable clothing and clothing appropriate for easy access to any Portacath or PICC line.   We strive to give you quality time with your provider. You may need to reschedule your appointment if you arrive late (15 or more minutes).  Arriving late affects you and other patients whose appointments are after yours.  Also, if you miss three or more appointments without notifying the office, you may be dismissed from the clinic at the provider's discretion.      For prescription refill requests, have your pharmacy contact our office and allow 72 hours for refills to be completed.    Today you received the following pump d/c return as scheduled.   To help prevent nausea and vomiting after your treatment, we encourage you to take your nausea medication as directed.  BELOW ARE SYMPTOMS THAT SHOULD BE REPORTED IMMEDIATELY: *FEVER GREATER THAN 100.4 F (38 C) OR HIGHER *CHILLS OR SWEATING *NAUSEA AND VOMITING THAT IS NOT CONTROLLED WITH YOUR NAUSEA MEDICATION *UNUSUAL SHORTNESS OF BREATH *UNUSUAL BRUISING OR BLEEDING *URINARY PROBLEMS (pain or burning when urinating, or frequent urination) *BOWEL PROBLEMS (unusual diarrhea, constipation, pain near the anus) TENDERNESS IN MOUTH AND THROAT WITH OR WITHOUT PRESENCE OF ULCERS (sore throat, sores in mouth, or a toothache) UNUSUAL RASH, SWELLING OR PAIN  UNUSUAL VAGINAL DISCHARGE OR ITCHING   Items with * indicate a potential emergency and should be followed up as soon as possible or go to the Emergency Department if  any problems should occur.  Please show the CHEMOTHERAPY ALERT CARD or IMMUNOTHERAPY ALERT CARD at check-in to the Emergency Department and triage nurse.  Should you have questions after your visit or need to cancel or reschedule your appointment, please contact [facility] AT [facility] [phone]  and follow the prompts.  Office hours are 8:00 a.m. to 4:30 p.m. Monday - Friday. Please note that voicemails left after 4:00 p.m. may not be returned until the following business day.  We are closed weekends and major holidays. You have access to a nurse at all times for urgent questions. Please call the main number to the clinic [phone] and follow the prompts.  For any non-urgent questions, you may also contact your provider using MyChart. We now offer e-Visits for anyone 79 and older to request care online for non-urgent symptoms. For details visit [email or web address].   Also download the MyChart app! Go to the app store, search "MyChart", open the app, select [place], and log in with your MyChart username and password.

## 2023-04-23 NOTE — Progress Notes (Signed)
[facility] [address], [place] [address]    Clinic Day:  04/24/2023  Referring physician: [name], PA  Patient Care Team: [name], [name] as PCP - General (Physician Assistant) [name], MD as Medical Oncologist (Medical Oncology) Therese Sarah, RN as Oncology Nurse Navigator (Medical Oncology)   ASSESSMENT & PLAN:   Assessment: 1. Left lung mass with multiple lung nodules: - Presentation with dry cough for 6 months. - 30 pound weight loss in the last couple of years, weight stable over the last 6 months. - CT chest with contrast on 12/16/2021 showed bulky left hilar mass measuring 8.1 x 7.5 cm.  Numerous bilateral lung nodules of varying sizes.  Left adrenal nodule measuring 2.8 x 2.2 cm.  Mediastinal and bilateral hilar adenopathy with the largest pretracheal node measuring 4.1 x 3.1 cm. - MRI of the brain from 01/04/2022 which was negative for metastatic disease. - CTAP from 01/03/2022 which showed isolated left adrenal metastasis with no other evidence of metastatic disease in the abdomen or pelvis. - Pathology of left lung biopsy which shows adenocarcinoma with necrosis.  CK20 positive and CDX2 positive but negative for CK7 indicating colonic primary. 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  HER2 negative.  TMB low.  MSI-stable.  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  Other targetable mutations negative. - FOLFIRI started on 02/22/2022, bevacizumab added with cycle 4  - CT CAP (05/17/2022): Mediastinal and hilar lymph nodes have decreased in size.  Largest perihilar left lower lobe lung mass has decreased in size.  Right upper lobe mass slightly increased in size.  Other nodules are stable.  Left adrenal mass decreased to 1.3 cm from 2.8 cm. - CT scan showed mixed response as it was compared to CT from 12/16/2021.  He did not start chemotherapy until 02/22/2022.   2. Social/family history: - Lives by himself.  He paints houses for living. -  He quit smoking 12 years back and started back again 1 and half year ago and smoked half pack per day.  He quit again about 1 week ago. - Father had cancer, type unknown to the patient.  Brother died of brain tumor.  3.  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  Margins negative.  No lymphovascular or perineural invasion. - Received 3 cycles of XELOX followed by Xeloda for total of 6 months.  Oxaliplatin discontinued during cycle 4 secondary to transaminitis, elevated bilirubin and thrombocytopenia.  However he was also treated for hep C with Harvoni after that.   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  Stable left adrenal nodule. - He is tolerating 5-FU and bevacizumab reasonably well. - Labs today: Normal LFTs and CBC with mild thrombocytopenia.  Last CEA was 44.5. - Proceed with treatment today and in 2 weeks.  Recommend follow-up in 4 weeks with CT CAP and CEA level.  2.  Lower rib/epigastric pain: - Continue oxycodone 10 mg every 4 hours as needed.  Pain is well-controlled.  3.  Difficulty falling asleep: - Continue trazodone as needed.  He has not required it in the last few months.  4.  Hypertension: - Continue amlodipine 10 mg daily.   Orders Placed This Encounter  Procedures   CT CHEST ABDOMEN PELVIS W CONTRAST  Standing Status:   Future    Standing Expiration Date:   04/23/2024    Order Specific Question:   If indicated for the ordered procedure, I authorize the administration of contrast media per Radiology protocol    Answer:   Yes    Order Specific Question:   Does the patient have a contrast media/X-ray dye allergy?    Answer:   No    Order Specific Question:   Preferred imaging location?    Answer:   [facility]    Order Specific Question:   If  indicated for the ordered procedure, I authorize the administration of oral contrast media per Radiology protocol    Answer:   Yes   CEA    Standing Status:   Standing    Number of Occurrences:   10    Standing Expiration Date:   04/23/2024       I,Alexis Herring,acting as a scribe for [name], MD.,have documented all relevant documentation on the behalf of [name], MD,as directed by  [name], MD while in the presence of [name], MD.  I, [name] MD, have reviewed the above documentation for accuracy and completeness, and I agree with the above.    [name], MD   5/14/20245:49 PM  CHIEF COMPLAINT:   Diagnosis: metastatic colon cancer to the lungs and left adrenal gland    Cancer Staging  Cecal cancer [facility]) Staging form: Colon and Rectum, AJCC 8th Edition - Clinical stage from 10/07/2019: Stage IIIB (cT3, cN1, cM0) - Signed by [name], MD on 10/07/2019 - Pathologic stage from 01/23/2022: Stage IVB (rpTX, pN0, pM1b) - Unsigned    Prior Therapy: 1. FOLFIRI 02/22/22 - 08/08/22  Current Therapy: maintenance 5-FU + bevacizumab   HISTORY OF PRESENT ILLNESS:   Oncology History  Cecal cancer (HCC)  10/07/2019 Initial Diagnosis   Cecal cancer (HCC)   10/07/2019 Cancer Staging   Staging form: Colon and Rectum, AJCC 8th Edition - Clinical stage from 10/07/2019: Stage IIIB (cT3, cN1, cM0) - Signed by [name], MD on 10/07/2019   02/22/2022 - 08/08/2022 Chemotherapy   Patient is on Treatment Plan : COLORECTAL FOLFIRI / BEVACIZUMAB Q14D     02/22/2022 -  Chemotherapy   Patient is on Treatment Plan : COLORECTAL FOLFIRI + Bevacizumab q14d        INTERVAL HISTORY:   Evan [name] is a 67 y.o. male presenting to clinic today for follow up of metastatic colon cancer to the lungs and left adrenal gland. He was last seen by me on 02/27/23.  Patient had an EGD on 03/12/23. Biopsy negative for metaplasia  or dysplasia.  Today, he states that he is doing well overall. His appetite level is at 85%. His energy level is at 100%.  PAST MEDICAL HISTORY:   Past Medical History: Past Medical History:  Diagnosis Date   Arthritis    Colon cancer (HCC)    colon   Hepatitis C    Hypertension    Port-A-Cath in place 02/15/2022    Surgical History: Past Surgical History:  Procedure Laterality Date   BIOPSY  03/12/2023   Procedure: BIOPSY;  Surgeon: Lanelle Bal, DO;  Location: [facility];  Service: Endoscopy;;   COLON SURGERY     ESOPHAGEAL BANDING N/A 03/12/2023   Procedure: ESOPHAGEAL BANDING;  Surgeon: Lanelle Bal, DO;  Location: [facility];  Service: Endoscopy;  Laterality: N/A;   ESOPHAGOGASTRODUODENOSCOPY (EGD) WITH PROPOFOL N/A 03/12/2023   Procedure: ESOPHAGOGASTRODUODENOSCOPY (  EGD) WITH PROPOFOL;  Surgeon: Lanelle Bal, DO;  Location: [facility];  Service: Endoscopy;  Laterality: N/A;  11:30AM; ASA 3   PORTACATH PLACEMENT Right 02/08/2022   Procedure: INSERTION PORT-A-CATH- RIJ;  Surgeon: Lewie Chamber, DO;  Location: [facility];  Service: General;  Laterality: Right;   REPLACEMENT TOTAL KNEE Left    SHOULDER ARTHROSCOPY Bilateral    TOTAL HIP ARTHROPLASTY Right 11/09/2020   Procedure: TOTAL HIP ARTHROPLASTY ANTERIOR APPROACH;  Surgeon: Sheral Apley, MD;  Location: [facility];  Service: Orthopedics;  Laterality: Right;   WRIST SURGERY Left     Social History: Social History   Socioeconomic History   Marital status: Married    Spouse name: Not on file   Number of children: 7   Years of education: Not on file   Highest education level: Not on file  Occupational History   Occupation: employed  Tobacco Use   Smoking status: Former    Packs/day: 1.50    Years: 20.00    Additional pack years: 0.00    Total pack years: 30.00    Types: Cigarettes    Quit date: 11/19/2005    Years since quitting: 17.4   Smokeless tobacco: Never  Vaping Use   Vaping  Use: Never used  Substance and Sexual Activity   Alcohol use: Not Currently   Drug use: Never   Sexual activity: Yes  Other Topics Concern   Not on file  Social History Narrative   ** Merged History Encounter **       Separated from wife 11/2021   Social Determinants of Health   Financial Resource Strain: Low Risk  (10/07/2019)   Overall Financial Resource Strain (CARDIA)    Difficulty of Paying Living Expenses: Not very hard  Food Insecurity: No Food Insecurity (10/07/2019)   Hunger Vital Sign    Worried About Running Out of Food in the Last Year: Never true    Ran Out of Food in the Last Year: Never true  Transportation Needs: No Transportation Needs (10/07/2019)   PRAPARE - Administrator, Civil Service (Medical): No    Lack of Transportation (Non-Medical): No  Physical Activity: Inactive (10/07/2019)   Exercise Vital Sign    Days of Exercise per Week: 0 days    Minutes of Exercise per Session: 0 min  Stress: No Stress Concern Present (10/07/2019)   [employer] of Occupational Health - Occupational Stress Questionnaire    Feeling of Stress : Not at all  Social Connections: Moderately Integrated (10/07/2019)   Social Connection and Isolation Panel [NHANES]    Frequency of Communication with Friends and Family: Once a week    Frequency of Social Gatherings with Friends and Family: Once a week    Attends Religious Services: More than 4 times per year    Active Member of [employer] or Organizations: Yes    Attends Engineer, structural: More than 4 times per year    Marital Status: Married  Catering manager Violence: Not At Risk (10/07/2019)   Humiliation, Afraid, Rape, and Kick questionnaire    Fear of Current or Ex-Partner: No    Emotionally Abused: No    Physically Abused: No    Sexually Abused: No    Family History: Family History  Problem Relation Age of Onset   Heart disease Mother    Dementia Father    Heart disease Sister    Cancer  Brother    Hypertension Brother    Hypertension  Brother    Healthy Son    Healthy Son    Healthy Son    Healthy Son    Healthy Daughter    Healthy Daughter    Healthy Daughter     Current Medications:  Current Outpatient Medications:    aluminum-magnesium hydroxide-simethicone (MAALOX) 200-200-20 MG/5ML SUSP, Take 30 mLs by mouth 4 (four) times daily -  before meals and at bedtime., Disp: 1680 mL, Rfl: 2   amLODipine (NORVASC) 10 MG tablet, Take 1 tablet (10 mg total) by mouth daily., Disp: 90 tablet, Rfl: 3   Bevacizumab (AVASTIN IV), Inject into the vein every 14 (fourteen) days. *start date TBD, Disp: , Rfl:    fluorouracil CALGB 09811 2,400 mg/m2 in sodium chloride 0.9 % 150 mL, Inject 2,400 mg/m2 into the vein over 48 hr., Disp: , Rfl:    FLUOROURACIL IV, Inject into the vein every 14 (fourteen) days., Disp: , Rfl:    Lactulose 20 GM/30ML SOLN, Take 15 mLs (10 g total) by mouth at bedtime. Take 15 ml at bedtime every night to assist with regular bowel movements.  Titrate down if having multiple bowel movements.  If a bowel movement has not occurred in 3 to 4 days or longer, then take 15 ml every 3 hours until a bowel movent has occurred., Disp: 450 mL, Rfl: 1   LEUCOVORIN CALCIUM IV, Inject into the vein every 14 (fourteen) days., Disp: , Rfl:    pantoprazole (PROTONIX) 40 MG tablet, Take 1 tablet (40 mg total) by mouth daily., Disp: 30 tablet, Rfl: 11   Oxycodone HCl 10 MG TABS, Take 1 tablet (10 mg total) by mouth every 4 (four) hours as needed., Disp: 168 tablet, Rfl: 0 No current facility-administered medications for this visit.  Facility-Administered Medications Ordered in Other Visits:    fluorouracil (ADRUCIL) 5,000 mg in sodium chloride 0.9 % 150 mL chemo infusion, 2,400 mg/m2 (Treatment Plan Recorded), Intravenous, 1 day or 1 dose, [name], MD, Infusion Verify at 04/24/23 1332   Allergies: No Known Allergies  REVIEW OF SYSTEMS:   Review of Systems   Constitutional:  Negative for chills, fatigue and fever.  HENT:   Negative for lump/mass, mouth sores, nosebleeds, sore throat and trouble swallowing.   Eyes:  Negative for eye problems.  Respiratory:  Negative for cough and shortness of breath.   Cardiovascular:  Negative for chest pain, leg swelling and palpitations.  Gastrointestinal:  Positive for constipation. Negative for abdominal pain, diarrhea, nausea and vomiting.  Genitourinary:  Negative for bladder incontinence, difficulty urinating, dysuria, frequency, hematuria and nocturia.   Musculoskeletal:  Negative for arthralgias, back pain, flank pain, myalgias and neck pain.  Skin:  Negative for itching and rash.  Neurological:  Negative for dizziness, headaches and numbness.  Hematological:  Does not bruise/bleed easily.  Psychiatric/Behavioral:  Negative for depression, sleep disturbance and suicidal ideas. The patient is not nervous/anxious.   All other systems reviewed and are negative.    VITALS:   Blood pressure (!) 154/92.  Wt Readings from Last 3 Encounters:  04/24/23 191 lb 3.2 oz (86.7 kg)  04/10/23 185 lb (83.9 kg)  03/13/23 183 lb 11.2 oz (83.3 kg)    There is no height or weight on file to calculate BMI.  Performance status (ECOG): 1 - Symptomatic but completely ambulatory  PHYSICAL EXAM:   Physical Exam Vitals and nursing note reviewed. Exam conducted with a chaperone present.  Constitutional:      Appearance: Normal appearance.  Cardiovascular:  Rate and Rhythm: Normal rate and regular rhythm.     Pulses: Normal pulses.     Heart sounds: Normal heart sounds.  Pulmonary:     Effort: Pulmonary effort is normal.     Breath sounds: Normal breath sounds.  Abdominal:     Palpations: Abdomen is soft. There is no hepatomegaly, splenomegaly or mass.     Tenderness: There is no abdominal tenderness.  Musculoskeletal:     Right lower leg: No edema.     Left lower leg: No edema.  Lymphadenopathy:      Cervical: No cervical adenopathy.     Right cervical: No superficial, deep or posterior cervical adenopathy.    Left cervical: No superficial, deep or posterior cervical adenopathy.     Upper Body:     Right upper body: No supraclavicular or axillary adenopathy.     Left upper body: No supraclavicular or axillary adenopathy.  Neurological:     General: No focal deficit present.     Mental Status: He is alert and oriented to person, place, and time.  Psychiatric:        Mood and Affect: Mood normal.        Behavior: Behavior normal.     LABS:      Latest Ref Rng & Units 04/24/2023    9:27 AM 04/10/2023   10:03 AM 03/13/2023    9:01 AM  CBC  WBC 4.0 - 10.5 K/uL 5.4  5.3  4.8   Hemoglobin 13.0 - 17.0 g/dL 29.5  62.1  30.8   Hematocrit 39.0 - 52.0 % 36.6  37.4  37.6   Platelets 150 - 400 K/uL 129  153  121       Latest Ref Rng & Units 04/24/2023    9:27 AM 04/10/2023   10:03 AM 03/13/2023    9:01 AM  CMP  Glucose 70 - 99 mg/dL 657  846  962   BUN 8 - 23 mg/dL 8  9  8    Creatinine 0.61 - 1.24 mg/dL 9.52  8.41  3.24   Sodium 135 - 145 mmol/L 138  136  138   Potassium 3.5 - 5.1 mmol/L 3.4  3.3  3.3   Chloride 98 - 111 mmol/L 105  103  104   CO2 22 - 32 mmol/L 25  24  25    Calcium 8.9 - 10.3 mg/dL 8.8  8.9  8.9   Total Protein 6.5 - 8.1 g/dL 7.1  7.4  7.2   Total Bilirubin 0.3 - 1.2 mg/dL 1.1  1.1  1.0   Alkaline Phos 38 - 126 U/L 43  38  37   AST 15 - 41 U/L 18  24  19    ALT 0 - 44 U/L 9  9  9       Lab Results  Component Value Date   CEA1 44.5 (H) 01/16/2023   /  CEA  Date Value Ref Range Status  01/16/2023 44.5 (H) 0.0 - 4.7 ng/mL Final    Comment:    (NOTE)                             Nonsmokers          <3.9                             Smokers             <  5.6 [employer] Electrochemiluminescence Immunoassay (ECLIA) Values obtained with different assay methods or kits cannot be used interchangeably.  Results cannot be interpreted as absolute evidence of the  presence or absence of malignant disease. Performed At: [facility] [address] [place], [place] [id number] Jolene Schimke MD WU:[id number]    No results found for: "PSA1" No results found for: "CAN199" No results found for: "CAN125"  No results found for: "TOTALPROTELP", "ALBUMINELP", "A1GS", "A2GS", "BETS", "BETA2SER", "GAMS", "MSPIKE", "SPEI" Lab Results  Component Value Date   TIBC 406 01/16/2023   TIBC 294 06/06/2022   TIBC 237 (L) 02/22/2022   FERRITIN 150 01/16/2023   FERRITIN 243 06/06/2022   FERRITIN 292 02/22/2022   IRONPCTSAT 18 01/16/2023   IRONPCTSAT 24 06/06/2022   IRONPCTSAT 11 (L) 02/22/2022   Lab Results  Component Value Date   LDH 258 (H) 12/26/2021     STUDIES:   No results found.

## 2023-04-24 ENCOUNTER — Other Ambulatory Visit: Payer: Self-pay | Admitting: *Deleted

## 2023-04-24 ENCOUNTER — Inpatient Hospital Stay: Payer: Medicare Other

## 2023-04-24 ENCOUNTER — Encounter: Payer: Self-pay | Admitting: Hematology

## 2023-04-24 ENCOUNTER — Inpatient Hospital Stay (HOSPITAL_BASED_OUTPATIENT_CLINIC_OR_DEPARTMENT_OTHER): Payer: Medicare Other | Admitting: Hematology

## 2023-04-24 VITALS — BP 165/90 | HR 62 | Temp 96.7°F | Resp 18 | Ht 70.0 in | Wt 191.2 lb

## 2023-04-24 DIAGNOSIS — Z95828 Presence of other vascular implants and grafts: Secondary | ICD-10-CM

## 2023-04-24 DIAGNOSIS — C180 Malignant neoplasm of cecum: Secondary | ICD-10-CM

## 2023-04-24 DIAGNOSIS — E876 Hypokalemia: Secondary | ICD-10-CM

## 2023-04-24 DIAGNOSIS — D696 Thrombocytopenia, unspecified: Secondary | ICD-10-CM | POA: Diagnosis not present

## 2023-04-24 DIAGNOSIS — Z87891 Personal history of nicotine dependence: Secondary | ICD-10-CM | POA: Diagnosis not present

## 2023-04-24 DIAGNOSIS — Z79899 Other long term (current) drug therapy: Secondary | ICD-10-CM | POA: Diagnosis not present

## 2023-04-24 DIAGNOSIS — Z5112 Encounter for antineoplastic immunotherapy: Secondary | ICD-10-CM | POA: Diagnosis not present

## 2023-04-24 DIAGNOSIS — C7972 Secondary malignant neoplasm of left adrenal gland: Secondary | ICD-10-CM | POA: Diagnosis not present

## 2023-04-24 DIAGNOSIS — C189 Malignant neoplasm of colon, unspecified: Secondary | ICD-10-CM | POA: Diagnosis not present

## 2023-04-24 DIAGNOSIS — C7802 Secondary malignant neoplasm of left lung: Secondary | ICD-10-CM | POA: Diagnosis not present

## 2023-04-24 DIAGNOSIS — C7801 Secondary malignant neoplasm of right lung: Secondary | ICD-10-CM | POA: Diagnosis not present

## 2023-04-24 DIAGNOSIS — I10 Essential (primary) hypertension: Secondary | ICD-10-CM | POA: Diagnosis not present

## 2023-04-24 DIAGNOSIS — Z5111 Encounter for antineoplastic chemotherapy: Secondary | ICD-10-CM | POA: Diagnosis not present

## 2023-04-24 DIAGNOSIS — R1013 Epigastric pain: Secondary | ICD-10-CM | POA: Diagnosis not present

## 2023-04-24 LAB — CBC WITH DIFFERENTIAL/PLATELET
Abs Immature Granulocytes: 0.01 10*3/uL (ref 0.00–0.07)
Basophils Absolute: 0.1 10*3/uL (ref 0.0–0.1)
Basophils Relative: 1 %
Eosinophils Absolute: 0.2 10*3/uL (ref 0.0–0.5)
Eosinophils Relative: 4 %
HCT: 36.6 % — ABNORMAL LOW (ref 39.0–52.0)
Hemoglobin: 11.9 g/dL — ABNORMAL LOW (ref 13.0–17.0)
Immature Granulocytes: 0 %
Lymphocytes Relative: 48 %
Lymphs Abs: 2.6 10*3/uL (ref 0.7–4.0)
MCH: 31.8 pg (ref 26.0–34.0)
MCHC: 32.5 g/dL (ref 30.0–36.0)
MCV: 97.9 fL (ref 80.0–100.0)
Monocytes Absolute: 0.6 10*3/uL (ref 0.1–1.0)
Monocytes Relative: 10 %
Neutro Abs: 2 10*3/uL (ref 1.7–7.7)
Neutrophils Relative %: 37 %
Platelets: 129 10*3/uL — ABNORMAL LOW (ref 150–400)
RBC: 3.74 MIL/uL — ABNORMAL LOW (ref 4.22–5.81)
RDW: 14.9 % (ref 11.5–15.5)
WBC: 5.4 10*3/uL (ref 4.0–10.5)
nRBC: 0 % (ref 0.0–0.2)

## 2023-04-24 LAB — COMPREHENSIVE METABOLIC PANEL
ALT: 9 U/L (ref 0–44)
AST: 18 U/L (ref 15–41)
Albumin: 3.7 g/dL (ref 3.5–5.0)
Alkaline Phosphatase: 43 U/L (ref 38–126)
Anion gap: 8 (ref 5–15)
BUN: 8 mg/dL (ref 8–23)
CO2: 25 mmol/L (ref 22–32)
Calcium: 8.8 mg/dL — ABNORMAL LOW (ref 8.9–10.3)
Chloride: 105 mmol/L (ref 98–111)
Creatinine, Ser: 1 mg/dL (ref 0.61–1.24)
GFR, Estimated: >60 mL/min (ref >60)
Glucose, Bld: 107 mg/dL — ABNORMAL HIGH (ref 70–99)
Potassium: 3.4 mmol/L — ABNORMAL LOW (ref 3.5–5.1)
Sodium: 138 mmol/L (ref 135–145)
Total Bilirubin: 1.1 mg/dL (ref 0.3–1.2)
Total Protein: 7.1 g/dL (ref 6.5–8.1)

## 2023-04-24 LAB — URINALYSIS, DIPSTICK ONLY
Bilirubin Urine: NEGATIVE
Glucose, UA: NEGATIVE mg/dL
Ketones, ur: NEGATIVE mg/dL
Leukocytes,Ua: NEGATIVE
Nitrite: NEGATIVE
Protein, ur: NEGATIVE mg/dL
Specific Gravity, Urine: 1.013 (ref 1.005–1.030)
pH: 6 (ref 5.0–8.0)

## 2023-04-24 LAB — MAGNESIUM: Magnesium: 1.8 mg/dL (ref 1.7–2.4)

## 2023-04-24 MED ORDER — PALONOSETRON HCL INJECTION 0.25 MG/5ML
0.2500 mg | Freq: Once | INTRAVENOUS | Status: AC
  Administered 2023-04-24: 0.25 mg via INTRAVENOUS
  Filled 2023-04-24: qty 5

## 2023-04-24 MED ORDER — SODIUM CHLORIDE 0.9 % IV SOLN
400.0000 mg/m2 | Freq: Once | INTRAVENOUS | Status: AC
  Administered 2023-04-24: 780 mg via INTRAVENOUS
  Filled 2023-04-24: qty 39

## 2023-04-24 MED ORDER — SODIUM CHLORIDE 0.9% FLUSH
10.0000 mL | Freq: Once | INTRAVENOUS | Status: AC
  Administered 2023-04-24: 10 mL via INTRAVENOUS

## 2023-04-24 MED ORDER — SODIUM CHLORIDE 0.9 % IV SOLN
10.0000 mg | Freq: Once | INTRAVENOUS | Status: AC
  Administered 2023-04-24: 10 mg via INTRAVENOUS
  Filled 2023-04-24: qty 10

## 2023-04-24 MED ORDER — FLUOROURACIL CHEMO INJECTION 2.5 GM/50ML
400.0000 mg/m2 | Freq: Once | INTRAVENOUS | Status: AC
  Administered 2023-04-24: 800 mg via INTRAVENOUS
  Filled 2023-04-24: qty 16

## 2023-04-24 MED ORDER — SODIUM CHLORIDE 0.9 % IV SOLN
5.0000 mg/kg | Freq: Once | INTRAVENOUS | Status: AC
  Administered 2023-04-24: 400 mg via INTRAVENOUS
  Filled 2023-04-24: qty 16

## 2023-04-24 MED ORDER — SODIUM CHLORIDE 0.9 % IV SOLN
2400.0000 mg/m2 | INTRAVENOUS | Status: DC
  Administered 2023-04-24: 5000 mg via INTRAVENOUS
  Filled 2023-04-24: qty 100

## 2023-04-24 MED ORDER — POTASSIUM CHLORIDE CRYS ER 20 MEQ PO TBCR
40.0000 meq | EXTENDED_RELEASE_TABLET | Freq: Once | ORAL | Status: AC
  Administered 2023-04-24: 40 meq via ORAL
  Filled 2023-04-24: qty 2

## 2023-04-24 MED ORDER — SODIUM CHLORIDE 0.9 % IV SOLN: Freq: Once | INTRAVENOUS | Status: AC

## 2023-04-24 MED ORDER — OXYCODONE HCL 10 MG PO TABS: 10.0000 mg | ORAL_TABLET | ORAL | 0 refills | Status: DC | PRN

## 2023-04-24 NOTE — Patient Instructions (Addendum)
[facility]  Discharge Instructions: Thank you for choosing [facility] to provide your oncology and hematology care.  If you have a lab appointment with the Cancer Center - please note that after April 8th, 2024, all labs will be drawn in the cancer center.  You do not have to check in or register with the main entrance as you have in the past but will complete your check-in in the cancer center.  Wear comfortable clothing and clothing appropriate for easy access to any Portacath or PICC line.   We strive to give you quality time with your provider. You may need to reschedule your appointment if you arrive late (15 or more minutes).  Arriving late affects you and other patients whose appointments are after yours.  Also, if you miss three or more appointments without notifying the office, you may be dismissed from the clinic at the provider's discretion.      For prescription refill requests, have your pharmacy contact our office and allow 72 hours for refills to be completed.    Today you received the following chemotherapy and/or immunotherapy agents MVASI, Leucovorin, and Adrucil.  Fluorouracil Injection What is this medication? FLUOROURACIL (flure oh YOOR a sil) treats some types of cancer. It works by slowing down the growth of cancer cells. This medicine may be used for other purposes; ask your health care provider or pharmacist if you have questions. COMMON BRAND NAME(S): Adrucil What should I tell my care team before I take this medication? They need to know if you have any of these conditions: Blood disorders Dihydropyrimidine dehydrogenase (DPD) deficiency Infection, such as chickenpox, cold sores, herpes Kidney disease Liver disease Poor nutrition Recent or ongoing radiation therapy An unusual or allergic reaction to fluorouracil, other medications, foods, dyes, or preservatives If you or your partner are pregnant or trying to get  pregnant Breast-feeding How should I use this medication? This medication is injected into a vein. It is administered by your care team in a hospital or clinic setting. Talk to your care team about the use of this medication in children. Special care may be needed. Overdosage: If you think you have taken too much of this medicine contact a poison control center or emergency room at once. NOTE: This medicine is only for you. Do not share this medicine with others. What if I miss a dose? Keep appointments for follow-up doses. It is important not to miss your dose. Call your care team if you are unable to keep an appointment. What may interact with this medication? Do not take this medication with any of the following: Live virus vaccines This medication may also interact with the following: Medications that treat or prevent blood clots, such as warfarin, enoxaparin, dalteparin This list may not describe all possible interactions. Give your health care provider a list of all the medicines, herbs, non-prescription drugs, or dietary supplements you use. Also tell them if you smoke, drink alcohol, or use illegal drugs. Some items may interact with your medicine. What should I watch for while using this medication? Your condition will be monitored carefully while you are receiving this medication. This medication may make you feel generally unwell. This is not uncommon as chemotherapy can affect healthy cells as well as cancer cells. Report any side effects. Continue your course of treatment even though you feel ill unless your care team tells you to stop. In some cases, you may be given additional medications to help with side  effects. Follow all directions for their use. This medication may increase your risk of getting an infection. Call your care team for advice if you get a fever, chills, sore throat, or other symptoms of a cold or flu. Do not treat yourself. Try to avoid being around people who are  sick. This medication may increase your risk to bruise or bleed. Call your care team if you notice any unusual bleeding. Be careful brushing or flossing your teeth or using a toothpick because you may get an infection or bleed more easily. If you have any dental work done, tell your dentist you are receiving this medication. Avoid taking medications that contain aspirin, acetaminophen, ibuprofen, naproxen, or ketoprofen unless instructed by your care team. These medications may hide a fever. Do not treat diarrhea with over the counter products. Contact your care team if you have diarrhea that lasts more than 2 days or if it is severe and watery. This medication can make you more sensitive to the sun. Keep out of the sun. If you cannot avoid being in the sun, wear protective clothing and sunscreen. Do not use sun lamps, tanning beds, or tanning booths. Talk to your care team if you or your partner wish to become pregnant or think you might be pregnant. This medication can cause serious birth defects if taken during pregnancy and for 3 months after the last dose. A reliable form of contraception is recommended while taking this medication and for 3 months after the last dose. Talk to your care team about effective forms of contraception. Do not father a child while taking this medication and for 3 months after the last dose. Use a condom while having sex during this time period. Do not breastfeed while taking this medication. This medication may cause infertility. Talk to your care team if you are concerned about your fertility. What side effects may I notice from receiving this medication? Side effects that you should report to your care team as soon as possible: Allergic reactions--skin rash, itching, hives, swelling of the face, lips, tongue, or throat Heart attack--pain or tightness in the chest, shoulders, arms, or jaw, nausea, shortness of breath, cold or clammy skin, feeling faint or  lightheaded Heart failure--shortness of breath, swelling of the ankles, feet, or hands, sudden weight gain, unusual weakness or fatigue Heart rhythm changes--fast or irregular heartbeat, dizziness, feeling faint or lightheaded, chest pain, trouble breathing High ammonia level--unusual weakness or fatigue, confusion, loss of appetite, nausea, vomiting, seizures Infection--fever, chills, cough, sore throat, wounds that don't heal, pain or trouble when passing urine, general feeling of discomfort or being unwell Low red blood cell level--unusual weakness or fatigue, dizziness, headache, trouble breathing Pain, tingling, or numbness in the hands or feet, muscle weakness, change in vision, confusion or trouble speaking, loss of balance or coordination, trouble walking, seizures Redness, swelling, and blistering of the skin over hands and feet Severe or prolonged diarrhea Unusual bruising or bleeding Side effects that usually do not require medical attention (report to your care team if they continue or are bothersome): Dry skin Headache Increased tears Nausea Pain, redness, or swelling with sores inside the mouth or throat Sensitivity to light Vomiting This list may not describe all possible side effects. Call your doctor for medical advice about side effects. You may report side effects to FDA at 1-800-FDA-1088. Where should I keep my medication? This medication is given in a hospital or clinic. It will not be stored at home. NOTE: This sheet is a  summary. It may not cover all possible information. If you have questions about this medicine, talk to your doctor, pharmacist, or health care provider.  2023 Elsevier/Gold Standard (2022-03-28 00:00:00) cil.  Leucovorin Injection What is this medication? LEUCOVORIN (loo koe VOR in) prevents side effects from certain medications, such as methotrexate. It works by increasing folate levels. This helps protect healthy cells in your body. It may also be  used to treat anemia caused by low levels of folate. It can also be used with fluorouracil, a type of chemotherapy, to treat colorectal cancer. It works by increasing the effects of fluorouracil in the body. This medicine may be used for other purposes; ask your health care provider or pharmacist if you have questions. What should I tell my care team before I take this medication? They need to know if you have any of these conditions: Anemia from low levels of vitamin B12 in the blood An unusual or allergic reaction to leucovorin, folic acid, other medications, foods, dyes, or preservatives Pregnant or trying to get pregnant Breastfeeding How should I use this medication? This medication is injected into a vein or a muscle. It is given by your care team in a hospital or clinic setting. Talk to your care team about the use of this medication in children. Special care may be needed. Overdosage: If you think you have taken too much of this medicine contact a poison control center or emergency room at once. NOTE: This medicine is only for you. Do not share this medicine with others. What if I miss a dose? Keep appointments for follow-up doses. It is important not to miss your dose. Call your care team if you are unable to keep an appointment. What may interact with this medication? Capecitabine Fluorouracil Phenobarbital Phenytoin Primidone Trimethoprim;sulfamethoxazole This list may not describe all possible interactions. Give your health care provider a list of all the medicines, herbs, non-prescription drugs, or dietary supplements you use. Also tell them if you smoke, drink alcohol, or use illegal drugs. Some items may interact with your medicine. What should I watch for while using this medication? Your condition will be monitored carefully while you are receiving this medication. This medication may increase the side effects of 5-fluorouracil. Tell your care team if you have diarrhea or  mouth sores that do not get better or that get worse. What side effects may I notice from receiving this medication? Side effects that you should report to your care team as soon as possible: Allergic reactions--skin rash, itching, hives, swelling of the face, lips, tongue, or throat This list may not describe all possible side effects. Call your doctor for medical advice about side effects. You may report side effects to FDA at 1-800-FDA-1088. Where should I keep my medication? This medication is given in a hospital or clinic. It will not be stored at home. NOTE: This sheet is a summary. It may not cover all possible information. If you have questions about this medicine, talk to your doctor, pharmacist, or health care provider.  2023 Elsevier/Gold Standard (2022-04-07 00:00:00) Bevacizumab Injection What is this medication? BEVACIZUMAB (be va SIZ yoo mab) treats some types of cancer. It works by blocking a protein that causes cancer cells to grow and multiply. This helps to slow or stop the spread of cancer cells. It is a monoclonal antibody. This medicine may be used for other purposes; ask your health care provider or pharmacist if you have questions. COMMON BRAND NAME(S): Alymsys, Avastin, MVASI, Zirabev What should  I tell my care team before I take this medication? They need to know if you have any of these conditions: Blood clots Coughing up blood Having or recent surgery Heart failure High blood pressure History of a connection between 2 or more body parts that do not usually connect (fistula) History of a tear in your stomach or intestines Protein in your urine An unusual or allergic reaction to bevacizumab, other medications, foods, dyes, or preservatives Pregnant or trying to get pregnant Breast-feeding How should I use this medication? This medication is injected into a vein. It is given by your care team in a hospital or clinic setting. Talk to your care team the use of  this medication in children. Special care may be needed. Overdosage: If you think you have taken too much of this medicine contact a poison control center or emergency room at once. NOTE: This medicine is only for you. Do not share this medicine with others. What if I miss a dose? Keep appointments for follow-up doses. It is important not to miss your dose. Call your care team if you are unable to keep an appointment. What may interact with this medication? Interactions are not expected. This list may not describe all possible interactions. Give your health care provider a list of all the medicines, herbs, non-prescription drugs, or dietary supplements you use. Also tell them if you smoke, drink alcohol, or use illegal drugs. Some items may interact with your medicine. What should I watch for while using this medication? Your condition will be monitored carefully while you are receiving this medication. You may need blood work while taking this medication. This medication may make you feel generally unwell. This is not uncommon as chemotherapy can affect healthy cells as well as cancer cells. Report any side effects. Continue your course of treatment even though you feel ill unless your care team tells you to stop. This medication may increase your risk to bruise or bleed. Call your care team if you notice any unusual bleeding. Before having surgery, talk to your care team to make sure it is ok. This medication can increase the risk of poor healing of your surgical site or wound. You will need to stop this medication for 28 days before surgery. After surgery, wait at least 28 days before restarting this medication. Make sure the surgical site or wound is healed enough before restarting this medication. Talk to your care team if questions. Talk to your care team if you may be pregnant. Serious birth defects can occur if you take this medication during pregnancy and for 6 months after the last dose.  Contraception is recommended while taking this medication and for 6 months after the last dose. Your care team can help you find the option that works for you. Do not breastfeed while taking this medication and for 6 months after the last dose. This medication can cause infertility. Talk to your care team if you are concerned about your fertility. What side effects may I notice from receiving this medication? Side effects that you should report to your care team as soon as possible: Allergic reactions--skin rash, itching, hives, swelling of the face, lips, tongue, or throat Bleeding--bloody or black, tar-like stools, vomiting blood or brown material that looks like coffee grounds, red or dark brown urine, small red or purple spots on skin, unusual bruising or bleeding Blood clot--pain, swelling, or warmth in the leg, shortness of breath, chest pain Heart attack--pain or tightness in the chest, shoulders, arms,  or jaw, nausea, shortness of breath, cold or clammy skin, feeling faint or lightheaded Heart failure--shortness of breath, swelling of the ankles, feet, or hands, sudden weight gain, unusual weakness or fatigue Increase in blood pressure Infection--fever, chills, cough, sore throat, wounds that don't heal, pain or trouble when passing urine, general feeling of discomfort or being unwell Infusion reactions--chest pain, shortness of breath or trouble breathing, feeling faint or lightheaded Kidney injury--decrease in the amount of urine, swelling of the ankles, hands, or feet Stomach pain that is severe, does not go away, or gets worse Stroke--sudden numbness or weakness of the face, arm, or leg, trouble speaking, confusion, trouble walking, loss of balance or coordination, dizziness, severe headache, change in vision Sudden and severe headache, confusion, change in vision, seizures, which may be signs of posterior reversible encephalopathy syndrome (PRES) Side effects that usually do not require  medical attention (report to your care team if they continue or are bothersome): Back pain Change in taste Diarrhea Dry skin Increased tears Nosebleed This list may not describe all possible side effects. Call your doctor for medical advice about side effects. You may report side effects to FDA at 1-800-FDA-1088. Where should I keep my medication? This medication is given in a hospital or clinic. It will not be stored at home. NOTE: This sheet is a summary. It may not cover all possible information. If you have questions about this medicine, talk to your doctor, pharmacist, or health care provider.  2023 Elsevier/Gold Standard (2022-03-31 00:00:00)       To help prevent nausea and vomiting after your treatment, we encourage you to take your nausea medication as directed.  BELOW ARE SYMPTOMS THAT SHOULD BE REPORTED IMMEDIATELY: *FEVER GREATER THAN 100.4 F (38 C) OR HIGHER *CHILLS OR SWEATING *NAUSEA AND VOMITING THAT IS NOT CONTROLLED WITH YOUR NAUSEA MEDICATION *UNUSUAL SHORTNESS OF BREATH *UNUSUAL BRUISING OR BLEEDING *URINARY PROBLEMS (pain or burning when urinating, or frequent urination) *BOWEL PROBLEMS (unusual diarrhea, constipation, pain near the anus) TENDERNESS IN MOUTH AND THROAT WITH OR WITHOUT PRESENCE OF ULCERS (sore throat, sores in mouth, or a toothache) UNUSUAL RASH, SWELLING OR PAIN  UNUSUAL VAGINAL DISCHARGE OR ITCHING   Items with * indicate a potential emergency and should be followed up as soon as possible or go to the Emergency Department if any problems should occur.  Please show the CHEMOTHERAPY ALERT CARD or IMMUNOTHERAPY ALERT CARD at check-in to the Emergency Department and triage nurse.  Should you have questions after your visit or need to cancel or reschedule your appointment, please contact [facility] [phone]  and follow the prompts.  Office hours are 8:00 a.m. to 4:30 p.m. Monday - Friday. Please note that voicemails left  after 4:00 p.m. may not be returned until the following business day.  We are closed weekends and major holidays. You have access to a nurse at all times for urgent questions. Please call the main number to the clinic [phone] and follow the prompts.  For any non-urgent questions, you may also contact your provider using MyChart. We now offer e-Visits for anyone 65 and older to request care online for non-urgent symptoms. For details visit [email or web address].   Also download the MyChart app! Go to the app store, search "MyChart", open the app, select [place], and log in with your MyChart username and password.

## 2023-04-24 NOTE — Progress Notes (Signed)
Patient presents today for chemotherapy/immunotherapy infusion of MVASI, leucovorin, and fluorouracil. Patient is in satisfactory condition with no new complaints voiced.  Vital signs are stable.  Labs reviewed by Dr. Ellin Saba during the office visit and all labs are within treatment parameters.  We will proceed with treatment per MD orders.   Patient tolerated treatment well with no complaints voiced.  Patient left ambulatory in stable condition.  Vital signs stable at discharge.  Follow up as scheduled.

## 2023-04-24 NOTE — Patient Instructions (Signed)
[facility] at [facility] Discharge Instructions   You were seen and examined today by Dr. Katragadda.  He reviewed the results of your lab work which are normal/stable.   We will proceed with your treatment today.  Return as scheduled.    Thank you for choosing [facility] at [facility] to provide your oncology and hematology care.  To afford each patient quality time with our provider, please arrive at least 15 minutes before your scheduled appointment time.   If you have a lab appointment with the Cancer Center please come in thru the Main Entrance and check in at the main information desk.  You need to re-schedule your appointment should you arrive 10 or more minutes late.  We strive to give you quality time with our providers, and arriving late affects you and other patients whose appointments are after yours.  Also, if you no show three or more times for appointments you may be dismissed from the clinic at the providers discretion.     Again, thank you for choosing [facility].  Our hope is that these requests will decrease the amount of time that you wait before being seen by our physicians.       _____________________________________________________________  Should you have questions after your visit to [facility], please contact our office at [phone] and follow the prompts.  Our office hours are 8:00 a.m. and 4:30 p.m. Monday - Friday.  Please note that voicemails left after 4:00 p.m. may not be returned until the following business day.  We are closed weekends and major holidays.  You do have access to a nurse 24-7, just call the main number to the clinic [phone] and do not press any options, hold on the line and a nurse will answer the phone.    For prescription refill requests, have your pharmacy contact our office and allow 72 hours.    Due to Covid, you will need to wear a mask upon entering  the hospital. If you do not have a mask, a mask will be given to you at the Main Entrance upon arrival. For doctor visits, patients may have 1 support person age 18 or older with them. For treatment visits, patients can not have anyone with them due to social distancing guidelines and our immunocompromised population.      

## 2023-04-24 NOTE — Progress Notes (Signed)
Patient has been examined by Dr. Ellin Saba. Vital signs (154/93) and labs have been reviewed by MD - ANC, Creatinine, LFTs, hemoglobin, and platelets are within treatment parameters per M.D. - pt may proceed with treatment.  Primary RN and pharmacy notified.

## 2023-04-25 ENCOUNTER — Other Ambulatory Visit: Payer: Self-pay

## 2023-04-25 DIAGNOSIS — C189 Malignant neoplasm of colon, unspecified: Secondary | ICD-10-CM | POA: Diagnosis not present

## 2023-04-25 LAB — CEA: CEA: 64 ng/mL — ABNORMAL HIGH (ref 0.0–4.7)

## 2023-04-26 ENCOUNTER — Inpatient Hospital Stay: Payer: Medicare Other

## 2023-04-26 VITALS — BP 133/74 | HR 58 | Temp 97.2°F | Resp 16

## 2023-04-26 DIAGNOSIS — Z95828 Presence of other vascular implants and grafts: Secondary | ICD-10-CM

## 2023-04-26 DIAGNOSIS — Z5111 Encounter for antineoplastic chemotherapy: Secondary | ICD-10-CM | POA: Diagnosis not present

## 2023-04-26 DIAGNOSIS — Z87891 Personal history of nicotine dependence: Secondary | ICD-10-CM | POA: Diagnosis not present

## 2023-04-26 DIAGNOSIS — Z79899 Other long term (current) drug therapy: Secondary | ICD-10-CM | POA: Diagnosis not present

## 2023-04-26 DIAGNOSIS — R1013 Epigastric pain: Secondary | ICD-10-CM | POA: Diagnosis not present

## 2023-04-26 DIAGNOSIS — C7802 Secondary malignant neoplasm of left lung: Secondary | ICD-10-CM | POA: Diagnosis not present

## 2023-04-26 DIAGNOSIS — C180 Malignant neoplasm of cecum: Secondary | ICD-10-CM

## 2023-04-26 DIAGNOSIS — Z5112 Encounter for antineoplastic immunotherapy: Secondary | ICD-10-CM | POA: Diagnosis not present

## 2023-04-26 DIAGNOSIS — I10 Essential (primary) hypertension: Secondary | ICD-10-CM | POA: Diagnosis not present

## 2023-04-26 DIAGNOSIS — C7801 Secondary malignant neoplasm of right lung: Secondary | ICD-10-CM | POA: Diagnosis not present

## 2023-04-26 DIAGNOSIS — C7972 Secondary malignant neoplasm of left adrenal gland: Secondary | ICD-10-CM | POA: Diagnosis not present

## 2023-04-26 DIAGNOSIS — D696 Thrombocytopenia, unspecified: Secondary | ICD-10-CM | POA: Diagnosis not present

## 2023-04-26 MED ORDER — SODIUM CHLORIDE 0.9% FLUSH
10.0000 mL | INTRAVENOUS | Status: DC | PRN
  Administered 2023-04-26: 10 mL

## 2023-04-26 MED ORDER — HEPARIN SOD (PORK) LOCK FLUSH 100 UNIT/ML IV SOLN
500.0000 [IU] | Freq: Once | INTRAVENOUS | Status: AC | PRN
  Administered 2023-04-26: 500 [IU]

## 2023-04-26 NOTE — Patient Instructions (Signed)
[facility] [place]  Discharge Instructions: Thank you for choosing [facility] to provide your oncology and hematology care.  If you have a lab appointment with the Cancer Center - please note that after April 8th, 2024, all labs will be drawn in the cancer center.  You do not have to check in or register with the main entrance as you have in the past but will complete your check-in in the cancer center.  Wear comfortable clothing and clothing appropriate for easy access to any Portacath or PICC line.   We strive to give you quality time with your provider. You may need to reschedule your appointment if you arrive late (15 or more minutes).  Arriving late affects you and other patients whose appointments are after yours.  Also, if you miss three or more appointments without notifying the office, you may be dismissed from the clinic at the provider's discretion.      For prescription refill requests, have your pharmacy contact our office and allow 72 hours for refills to be completed.    Today you received the following 5FU pump disconnected.     To help prevent nausea and vomiting after your treatment, we encourage you to take your nausea medication as directed.  BELOW ARE SYMPTOMS THAT SHOULD BE REPORTED IMMEDIATELY: *FEVER GREATER THAN 100.4 F (38 C) OR HIGHER *CHILLS OR SWEATING *NAUSEA AND VOMITING THAT IS NOT CONTROLLED WITH YOUR NAUSEA MEDICATION *UNUSUAL SHORTNESS OF BREATH *UNUSUAL BRUISING OR BLEEDING *URINARY PROBLEMS (pain or burning when urinating, or frequent urination) *BOWEL PROBLEMS (unusual diarrhea, constipation, pain near the anus) TENDERNESS IN MOUTH AND THROAT WITH OR WITHOUT PRESENCE OF ULCERS (sore throat, sores in mouth, or a toothache) UNUSUAL RASH, SWELLING OR PAIN  UNUSUAL VAGINAL DISCHARGE OR ITCHING   Items with * indicate a potential emergency and should be followed up as soon as possible or go to the Emergency Department if any  problems should occur.  Please show the CHEMOTHERAPY ALERT CARD or IMMUNOTHERAPY ALERT CARD at check-in to the Emergency Department and triage nurse.  Should you have questions after your visit or need to cancel or reschedule your appointment, please contact [facility] [phone]  and follow the prompts.  Office hours are 8:00 a.m. to 4:30 p.m. Monday - Friday. Please note that voicemails left after 4:00 p.m. may not be returned until the following business day.  We are closed weekends and major holidays. You have access to a nurse at all times for urgent questions. Please call the main number to the clinic [phone] and follow the prompts.  For any non-urgent questions, you may also contact your provider using MyChart. We now offer e-Visits for anyone 78 and older to request care online for non-urgent symptoms. For details visit [email or web address].   Also download the MyChart app! Go to the app store, search "MyChart", open the app, select [place], and log in with your MyChart username and password.

## 2023-04-26 NOTE — Progress Notes (Signed)
Patient here to have 5FU pump disconnected. Infusion complete upon arrival. Pump removed with no issues. Port deaccessed. Patient left ambulatory in stable condition.  Vital signs stable at discharge.  Follow up as scheduled.

## 2023-05-01 ENCOUNTER — Encounter: Payer: Self-pay | Admitting: Internal Medicine

## 2023-05-02 ENCOUNTER — Other Ambulatory Visit: Payer: Self-pay

## 2023-05-02 DIAGNOSIS — C180 Malignant neoplasm of cecum: Secondary | ICD-10-CM

## 2023-05-09 ENCOUNTER — Inpatient Hospital Stay: Payer: Medicare Other

## 2023-05-09 VITALS — BP 144/82 | HR 56 | Temp 97.5°F | Resp 18

## 2023-05-09 DIAGNOSIS — C7802 Secondary malignant neoplasm of left lung: Secondary | ICD-10-CM | POA: Diagnosis not present

## 2023-05-09 DIAGNOSIS — Z79899 Other long term (current) drug therapy: Secondary | ICD-10-CM | POA: Diagnosis not present

## 2023-05-09 DIAGNOSIS — R1013 Epigastric pain: Secondary | ICD-10-CM | POA: Diagnosis not present

## 2023-05-09 DIAGNOSIS — Z87891 Personal history of nicotine dependence: Secondary | ICD-10-CM | POA: Diagnosis not present

## 2023-05-09 DIAGNOSIS — Z95828 Presence of other vascular implants and grafts: Secondary | ICD-10-CM

## 2023-05-09 DIAGNOSIS — C180 Malignant neoplasm of cecum: Secondary | ICD-10-CM | POA: Diagnosis not present

## 2023-05-09 DIAGNOSIS — D696 Thrombocytopenia, unspecified: Secondary | ICD-10-CM | POA: Diagnosis not present

## 2023-05-09 DIAGNOSIS — C7972 Secondary malignant neoplasm of left adrenal gland: Secondary | ICD-10-CM | POA: Diagnosis not present

## 2023-05-09 DIAGNOSIS — E876 Hypokalemia: Secondary | ICD-10-CM

## 2023-05-09 DIAGNOSIS — Z5111 Encounter for antineoplastic chemotherapy: Secondary | ICD-10-CM | POA: Diagnosis not present

## 2023-05-09 DIAGNOSIS — Z5112 Encounter for antineoplastic immunotherapy: Secondary | ICD-10-CM | POA: Diagnosis not present

## 2023-05-09 DIAGNOSIS — I10 Essential (primary) hypertension: Secondary | ICD-10-CM | POA: Diagnosis not present

## 2023-05-09 DIAGNOSIS — C7801 Secondary malignant neoplasm of right lung: Secondary | ICD-10-CM | POA: Diagnosis not present

## 2023-05-09 DIAGNOSIS — C189 Malignant neoplasm of colon, unspecified: Secondary | ICD-10-CM | POA: Diagnosis not present

## 2023-05-09 LAB — COMPREHENSIVE METABOLIC PANEL
ALT: 9 U/L (ref 0–44)
AST: 20 U/L (ref 15–41)
Albumin: 3.8 g/dL (ref 3.5–5.0)
Alkaline Phosphatase: 41 U/L (ref 38–126)
Anion gap: 10 (ref 5–15)
BUN: 8 mg/dL (ref 8–23)
CO2: 22 mmol/L (ref 22–32)
Calcium: 9 mg/dL (ref 8.9–10.3)
Chloride: 104 mmol/L (ref 98–111)
Creatinine, Ser: 0.97 mg/dL (ref 0.61–1.24)
GFR, Estimated: >60 mL/min (ref >60)
Glucose, Bld: 139 mg/dL — ABNORMAL HIGH (ref 70–99)
Potassium: 3.2 mmol/L — ABNORMAL LOW (ref 3.5–5.1)
Sodium: 136 mmol/L (ref 135–145)
Total Bilirubin: 0.8 mg/dL (ref 0.3–1.2)
Total Protein: 7.5 g/dL (ref 6.5–8.1)

## 2023-05-09 LAB — CBC WITH DIFFERENTIAL/PLATELET
Abs Immature Granulocytes: 0.02 10*3/uL (ref 0.00–0.07)
Basophils Absolute: 0.1 10*3/uL (ref 0.0–0.1)
Basophils Relative: 2 %
Eosinophils Absolute: 0.2 10*3/uL (ref 0.0–0.5)
Eosinophils Relative: 4 %
HCT: 37.6 % — ABNORMAL LOW (ref 39.0–52.0)
Hemoglobin: 12.5 g/dL — ABNORMAL LOW (ref 13.0–17.0)
Immature Granulocytes: 0 %
Lymphocytes Relative: 48 %
Lymphs Abs: 2.6 10*3/uL (ref 0.7–4.0)
MCH: 31.7 pg (ref 26.0–34.0)
MCHC: 33.2 g/dL (ref 30.0–36.0)
MCV: 95.4 fL (ref 80.0–100.0)
Monocytes Absolute: 0.7 10*3/uL (ref 0.1–1.0)
Monocytes Relative: 12 %
Neutro Abs: 1.8 10*3/uL (ref 1.7–7.7)
Neutrophils Relative %: 34 %
Platelets: 148 10*3/uL — ABNORMAL LOW (ref 150–400)
RBC: 3.94 MIL/uL — ABNORMAL LOW (ref 4.22–5.81)
RDW: 14.4 % (ref 11.5–15.5)
WBC: 5.4 10*3/uL (ref 4.0–10.5)
nRBC: 0 % (ref 0.0–0.2)

## 2023-05-09 LAB — MAGNESIUM: Magnesium: 1.8 mg/dL (ref 1.7–2.4)

## 2023-05-09 MED ORDER — SODIUM CHLORIDE 0.9% FLUSH
10.0000 mL | Freq: Once | INTRAVENOUS | Status: AC
  Administered 2023-05-09: 10 mL via INTRAVENOUS

## 2023-05-09 MED ORDER — SODIUM CHLORIDE 0.9 % IV SOLN
2400.0000 mg/m2 | INTRAVENOUS | Status: DC
  Administered 2023-05-09: 5000 mg via INTRAVENOUS
  Filled 2023-05-09: qty 100

## 2023-05-09 MED ORDER — PALONOSETRON HCL INJECTION 0.25 MG/5ML
0.2500 mg | Freq: Once | INTRAVENOUS | Status: AC
  Administered 2023-05-09: 0.25 mg via INTRAVENOUS
  Filled 2023-05-09: qty 5

## 2023-05-09 MED ORDER — FLUOROURACIL CHEMO INJECTION 2.5 GM/50ML
400.0000 mg/m2 | Freq: Once | INTRAVENOUS | Status: AC
  Administered 2023-05-09: 800 mg via INTRAVENOUS
  Filled 2023-05-09: qty 16

## 2023-05-09 MED ORDER — SODIUM CHLORIDE 0.9 % IV SOLN: Freq: Once | INTRAVENOUS | Status: AC

## 2023-05-09 MED ORDER — SODIUM CHLORIDE 0.9 % IV SOLN
10.0000 mg | Freq: Once | INTRAVENOUS | Status: AC
  Administered 2023-05-09: 10 mg via INTRAVENOUS
  Filled 2023-05-09: qty 10

## 2023-05-09 MED ORDER — SODIUM CHLORIDE 0.9 % IV SOLN
5.0000 mg/kg | Freq: Once | INTRAVENOUS | Status: AC
  Administered 2023-05-09: 400 mg via INTRAVENOUS
  Filled 2023-05-09: qty 16

## 2023-05-09 MED ORDER — SODIUM CHLORIDE 0.9 % IV SOLN
400.0000 mg/m2 | Freq: Once | INTRAVENOUS | Status: AC
  Administered 2023-05-09: 780 mg via INTRAVENOUS
  Filled 2023-05-09: qty 39

## 2023-05-09 MED ORDER — POTASSIUM CHLORIDE CRYS ER 20 MEQ PO TBCR
40.0000 meq | EXTENDED_RELEASE_TABLET | Freq: Once | ORAL | Status: AC
  Administered 2023-05-09: 40 meq via ORAL
  Filled 2023-05-09: qty 2

## 2023-05-09 NOTE — Progress Notes (Signed)
Patient presents today for chemotherapy infusion.  Patient is in satisfactory condition with no new complaints voiced.  Vital signs are stable.  Labs reviewed and all labs are within treatment parameters.  Potassium today is 3.2.  We will give Klor Con 40 mEq PO x one dose today per standing orders by Dr. Ellin Saba.  We will proceed with treatment per MD orders.    Patient tolerated treatment well with no complaints voiced.  Home infusion 5FU pump connected with no issues.  Patient left ambulatory in stable condition.  Vital signs stable at discharge.  Follow up as scheduled.

## 2023-05-09 NOTE — Patient Instructions (Signed)
[facility] [place]  Discharge Instructions: Thank you for choosing [facility] to provide your oncology and hematology care.  If you have a lab appointment with the Cancer Center - please note that after April 8th, 2024, all labs will be drawn in the cancer center.  You do not have to check in or register with the main entrance as you have in the past but will complete your check-in in the cancer center.  Wear comfortable clothing and clothing appropriate for easy access to any Portacath or PICC line.   We strive to give you quality time with your provider. You may need to reschedule your appointment if you arrive late (15 or more minutes).  Arriving late affects you and other patients whose appointments are after yours.  Also, if you miss three or more appointments without notifying the office, you may be dismissed from the clinic at the provider's discretion.      For prescription refill requests, have your pharmacy contact our office and allow 72 hours for refills to be completed.    Today you received the following chemotherapy and/or immunotherapy agents MVASI/Leucovorin/5FU.  Bevacizumab Injection What is this medication? BEVACIZUMAB (be va SIZ yoo mab) treats some types of cancer. It works by blocking a protein that causes cancer cells to grow and multiply. This helps to slow or stop the spread of cancer cells. It is a monoclonal antibody. This medicine may be used for other purposes; ask your health care provider or pharmacist if you have questions. COMMON BRAND NAME(S): Alymsys, Avastin, MVASI, Omer Jack What should I tell my care team before I take this medication? They need to know if you have any of these conditions: Blood clots Coughing up blood Having or recent surgery Heart failure High blood pressure History of a connection between 2 or more body parts that do not usually connect (fistula) History of a tear in your stomach or intestines Protein in  your urine An unusual or allergic reaction to bevacizumab, other medications, foods, dyes, or preservatives Pregnant or trying to get pregnant Breast-feeding How should I use this medication? This medication is injected into a vein. It is given by your care team in a hospital or clinic setting. Talk to your care team the use of this medication in children. Special care may be needed. Overdosage: If you think you have taken too much of this medicine contact a poison control center or emergency room at once. NOTE: This medicine is only for you. Do not share this medicine with others. What if I miss a dose? Keep appointments for follow-up doses. It is important not to miss your dose. Call your care team if you are unable to keep an appointment. What may interact with this medication? Interactions are not expected. This list may not describe all possible interactions. Give your health care provider a list of all the medicines, herbs, non-prescription drugs, or dietary supplements you use. Also tell them if you smoke, drink alcohol, or use illegal drugs. Some items may interact with your medicine. What should I watch for while using this medication? Your condition will be monitored carefully while you are receiving this medication. You may need blood work while taking this medication. This medication may make you feel generally unwell. This is not uncommon as chemotherapy can affect healthy cells as well as cancer cells. Report any side effects. Continue your course of treatment even though you feel ill unless your care team tells you to stop. This  medication may increase your risk to bruise or bleed. Call your care team if you notice any unusual bleeding. Before having surgery, talk to your care team to make sure it is ok. This medication can increase the risk of poor healing of your surgical site or wound. You will need to stop this medication for 28 days before surgery. After surgery, wait at least  28 days before restarting this medication. Make sure the surgical site or wound is healed enough before restarting this medication. Talk to your care team if questions. Talk to your care team if you may be pregnant. Serious birth defects can occur if you take this medication during pregnancy and for 6 months after the last dose. Contraception is recommended while taking this medication and for 6 months after the last dose. Your care team can help you find the option that works for you. Do not breastfeed while taking this medication and for 6 months after the last dose. This medication can cause infertility. Talk to your care team if you are concerned about your fertility. What side effects may I notice from receiving this medication? Side effects that you should report to your care team as soon as possible: Allergic reactions--skin rash, itching, hives, swelling of the face, lips, tongue, or throat Bleeding--bloody or black, tar-like stools, vomiting blood or [place] Flammer material that looks like coffee grounds, red or dark Shrihan Putt urine, small red or purple spots on skin, unusual bruising or bleeding Blood clot--pain, swelling, or warmth in the leg, shortness of breath, chest pain Heart attack--pain or tightness in the chest, shoulders, arms, or jaw, nausea, shortness of breath, cold or clammy skin, feeling faint or lightheaded Heart failure--shortness of breath, swelling of the ankles, feet, or hands, sudden weight gain, unusual weakness or fatigue Increase in blood pressure Infection--fever, chills, cough, sore throat, wounds that don't heal, pain or trouble when passing urine, general feeling of discomfort or being unwell Infusion reactions--chest pain, shortness of breath or trouble breathing, feeling faint or lightheaded Kidney injury--decrease in the amount of urine, swelling of the ankles, hands, or feet Stomach pain that is severe, does not go away, or gets worse Stroke--sudden numbness or weakness  of the face, arm, or leg, trouble speaking, confusion, trouble walking, loss of balance or coordination, dizziness, severe headache, change in vision Sudden and severe headache, confusion, change in vision, seizures, which may be signs of posterior reversible encephalopathy syndrome (PRES) Side effects that usually do not require medical attention (report to your care team if they continue or are bothersome): Back pain Change in taste Diarrhea Dry skin Increased tears Nosebleed This list may not describe all possible side effects. Call your doctor for medical advice about side effects. You may report side effects to FDA at 1-800-FDA-1088. Where should I keep my medication? This medication is given in a hospital or clinic. It will not be stored at home. NOTE: This sheet is a summary. It may not cover all possible information. If you have questions about this medicine, talk to your doctor, pharmacist, or health care provider.  2024 Elsevier/Gold Standard (2022-04-14 00:00:00)    Leucovorin Injection What is this medication? LEUCOVORIN (loo koe VOR in) prevents side effects from certain medications, such as methotrexate. It works by increasing folate levels. This helps protect healthy cells in your body. It may also be used to treat anemia caused by low levels of folate. It can also be used with fluorouracil, a type of chemotherapy, to treat colorectal cancer. It works by  increasing the effects of fluorouracil in the body. This medicine may be used for other purposes; ask your health care provider or pharmacist if you have questions. What should I tell my care team before I take this medication? They need to know if you have any of these conditions: Anemia from low levels of vitamin B12 in the blood An unusual or allergic reaction to leucovorin, folic acid, other medications, foods, dyes, or preservatives Pregnant or trying to get pregnant Breastfeeding How should I use this  medication? This medication is injected into a vein or a muscle. It is given by your care team in a hospital or clinic setting. Talk to your care team about the use of this medication in children. Special care may be needed. Overdosage: If you think you have taken too much of this medicine contact a poison control center or emergency room at once. NOTE: This medicine is only for you. Do not share this medicine with others. What if I miss a dose? Keep appointments for follow-up doses. It is important not to miss your dose. Call your care team if you are unable to keep an appointment. What may interact with this medication? Capecitabine Fluorouracil Phenobarbital Phenytoin Primidone Trimethoprim;sulfamethoxazole This list may not describe all possible interactions. Give your health care provider a list of all the medicines, herbs, non-prescription drugs, or dietary supplements you use. Also tell them if you smoke, drink alcohol, or use illegal drugs. Some items may interact with your medicine. What should I watch for while using this medication? Your condition will be monitored carefully while you are receiving this medication. This medication may increase the side effects of 5-fluorouracil. Tell your care team if you have diarrhea or mouth sores that do not get better or that get worse. What side effects may I notice from receiving this medication? Side effects that you should report to your care team as soon as possible: Allergic reactions--skin rash, itching, hives, swelling of the face, lips, tongue, or throat This list may not describe all possible side effects. Call your doctor for medical advice about side effects. You may report side effects to FDA at 1-800-FDA-1088. Where should I keep my medication? This medication is given in a hospital or clinic. It will not be stored at home. NOTE: This sheet is a summary. It may not cover all possible information. If you have questions about this  medicine, talk to your doctor, pharmacist, or health care provider.  2024 Elsevier/Gold Standard (2022-05-02 00:00:00)    Fluorouracil Injection What is this medication? FLUOROURACIL (flure oh YOOR a sil) treats some types of cancer. It works by slowing down the growth of cancer cells. This medicine may be used for other purposes; ask your health care provider or pharmacist if you have questions. COMMON BRAND NAME(S): Adrucil What should I tell my care team before I take this medication? They need to know if you have any of these conditions: Blood disorders Dihydropyrimidine dehydrogenase (DPD) deficiency Infection, such as chickenpox, cold sores, herpes Kidney disease Liver disease Poor nutrition Recent or ongoing radiation therapy An unusual or allergic reaction to fluorouracil, other medications, foods, dyes, or preservatives If you or your partner are pregnant or trying to get pregnant Breast-feeding How should I use this medication? This medication is injected into a vein. It is administered by your care team in a hospital or clinic setting. Talk to your care team about the use of this medication in children. Special care may be needed. Overdosage: If you  think you have taken too much of this medicine contact a poison control center or emergency room at once. NOTE: This medicine is only for you. Do not share this medicine with others. What if I miss a dose? Keep appointments for follow-up doses. It is important not to miss your dose. Call your care team if you are unable to keep an appointment. What may interact with this medication? Do not take this medication with any of the following: Live virus vaccines This medication may also interact with the following: Medications that treat or prevent blood clots, such as warfarin, enoxaparin, dalteparin This list may not describe all possible interactions. Give your health care provider a list of all the medicines, herbs,  non-prescription drugs, or dietary supplements you use. Also tell them if you smoke, drink alcohol, or use illegal drugs. Some items may interact with your medicine. What should I watch for while using this medication? Your condition will be monitored carefully while you are receiving this medication. This medication may make you feel generally unwell. This is not uncommon as chemotherapy can affect healthy cells as well as cancer cells. Report any side effects. Continue your course of treatment even though you feel ill unless your care team tells you to stop. In some cases, you may be given additional medications to help with side effects. Follow all directions for their use. This medication may increase your risk of getting an infection. Call your care team for advice if you get a fever, chills, sore throat, or other symptoms of a cold or flu. Do not treat yourself. Try to avoid being around people who are sick. This medication may increase your risk to bruise or bleed. Call your care team if you notice any unusual bleeding. Be careful brushing or flossing your teeth or using a toothpick because you may get an infection or bleed more easily. If you have any dental work done, tell your dentist you are receiving this medication. Avoid taking medications that contain aspirin, acetaminophen, ibuprofen, naproxen, or ketoprofen unless instructed by your care team. These medications may hide a fever. Do not treat diarrhea with over the counter products. Contact your care team if you have diarrhea that lasts more than 2 days or if it is severe and watery. This medication can make you more sensitive to the sun. Keep out of the sun. If you cannot avoid being in the sun, wear protective clothing and sunscreen. Do not use sun lamps, tanning beds, or tanning booths. Talk to your care team if you or your partner wish to become pregnant or think you might be pregnant. This medication can cause serious birth defects if  taken during pregnancy and for 3 months after the last dose. A reliable form of contraception is recommended while taking this medication and for 3 months after the last dose. Talk to your care team about effective forms of contraception. Do not father a child while taking this medication and for 3 months after the last dose. Use a condom while having sex during this time period. Do not breastfeed while taking this medication. This medication may cause infertility. Talk to your care team if you are concerned about your fertility. What side effects may I notice from receiving this medication? Side effects that you should report to your care team as soon as possible: Allergic reactions--skin rash, itching, hives, swelling of the face, lips, tongue, or throat Heart attack--pain or tightness in the chest, shoulders, arms, or jaw, nausea, shortness of breath,  cold or clammy skin, feeling faint or lightheaded Heart failure--shortness of breath, swelling of the ankles, feet, or hands, sudden weight gain, unusual weakness or fatigue Heart rhythm changes--fast or irregular heartbeat, dizziness, feeling faint or lightheaded, chest pain, trouble breathing High ammonia level--unusual weakness or fatigue, confusion, loss of appetite, nausea, vomiting, seizures Infection--fever, chills, cough, sore throat, wounds that don't heal, pain or trouble when passing urine, general feeling of discomfort or being unwell Low red blood cell level--unusual weakness or fatigue, dizziness, headache, trouble breathing Pain, tingling, or numbness in the hands or feet, muscle weakness, change in vision, confusion or trouble speaking, loss of balance or coordination, trouble walking, seizures Redness, swelling, and blistering of the skin over hands and feet Severe or prolonged diarrhea Unusual bruising or bleeding Side effects that usually do not require medical attention (report to your care team if they continue or are  bothersome): Dry skin Headache Increased tears Nausea Pain, redness, or swelling with sores inside the mouth or throat Sensitivity to light Vomiting This list may not describe all possible side effects. Call your doctor for medical advice about side effects. You may report side effects to FDA at 1-800-FDA-1088. Where should I keep my medication? This medication is given in a hospital or clinic. It will not be stored at home. NOTE: This sheet is a summary. It may not cover all possible information. If you have questions about this medicine, talk to your doctor, pharmacist, or health care provider.  2024 Elsevier/Gold Standard (2022-04-04 00:00:00)        To help prevent nausea and vomiting after your treatment, we encourage you to take your nausea medication as directed.  BELOW ARE SYMPTOMS THAT SHOULD BE REPORTED IMMEDIATELY: *FEVER GREATER THAN 100.4 F (38 C) OR HIGHER *CHILLS OR SWEATING *NAUSEA AND VOMITING THAT IS NOT CONTROLLED WITH YOUR NAUSEA MEDICATION *UNUSUAL SHORTNESS OF BREATH *UNUSUAL BRUISING OR BLEEDING *URINARY PROBLEMS (pain or burning when urinating, or frequent urination) *BOWEL PROBLEMS (unusual diarrhea, constipation, pain near the anus) TENDERNESS IN MOUTH AND THROAT WITH OR WITHOUT PRESENCE OF ULCERS (sore throat, sores in mouth, or a toothache) UNUSUAL RASH, SWELLING OR PAIN  UNUSUAL VAGINAL DISCHARGE OR ITCHING   Items with * indicate a potential emergency and should be followed up as soon as possible or go to the Emergency Department if any problems should occur.  Please show the CHEMOTHERAPY ALERT CARD or IMMUNOTHERAPY ALERT CARD at check-in to the Emergency Department and triage nurse.  Should you have questions after your visit or need to cancel or reschedule your appointment, please contact [facility] AT [facility] [phone]  and follow the prompts.  Office hours are 8:00 a.m. to 4:30 p.m. Monday - Friday. Please note that voicemails left  after 4:00 p.m. may not be returned until the following business day.  We are closed weekends and major holidays. You have access to a nurse at all times for urgent questions. Please call the main number to the clinic [phone] and follow the prompts.  For any non-urgent questions, you may also contact your provider using MyChart. We now offer e-Visits for anyone 93 and older to request care online for non-urgent symptoms. For details visit [email or web address].   Also download the MyChart app! Go to the app store, search "MyChart", open the app, select [place], and log in with your MyChart username and password.

## 2023-05-11 ENCOUNTER — Inpatient Hospital Stay: Payer: Medicare Other

## 2023-05-11 VITALS — BP 145/86 | HR 71 | Temp 97.8°F | Resp 18

## 2023-05-11 DIAGNOSIS — I10 Essential (primary) hypertension: Secondary | ICD-10-CM | POA: Diagnosis not present

## 2023-05-11 DIAGNOSIS — Z87891 Personal history of nicotine dependence: Secondary | ICD-10-CM | POA: Diagnosis not present

## 2023-05-11 DIAGNOSIS — Z5112 Encounter for antineoplastic immunotherapy: Secondary | ICD-10-CM | POA: Diagnosis not present

## 2023-05-11 DIAGNOSIS — C7802 Secondary malignant neoplasm of left lung: Secondary | ICD-10-CM | POA: Diagnosis not present

## 2023-05-11 DIAGNOSIS — Z79899 Other long term (current) drug therapy: Secondary | ICD-10-CM | POA: Diagnosis not present

## 2023-05-11 DIAGNOSIS — C7972 Secondary malignant neoplasm of left adrenal gland: Secondary | ICD-10-CM | POA: Diagnosis not present

## 2023-05-11 DIAGNOSIS — R1013 Epigastric pain: Secondary | ICD-10-CM | POA: Diagnosis not present

## 2023-05-11 DIAGNOSIS — C7801 Secondary malignant neoplasm of right lung: Secondary | ICD-10-CM | POA: Diagnosis not present

## 2023-05-11 DIAGNOSIS — Z95828 Presence of other vascular implants and grafts: Secondary | ICD-10-CM

## 2023-05-11 DIAGNOSIS — D696 Thrombocytopenia, unspecified: Secondary | ICD-10-CM | POA: Diagnosis not present

## 2023-05-11 DIAGNOSIS — C180 Malignant neoplasm of cecum: Secondary | ICD-10-CM

## 2023-05-11 DIAGNOSIS — Z5111 Encounter for antineoplastic chemotherapy: Secondary | ICD-10-CM | POA: Diagnosis not present

## 2023-05-11 MED ORDER — SODIUM CHLORIDE 0.9% FLUSH
10.0000 mL | INTRAVENOUS | Status: DC | PRN
  Administered 2023-05-11: 10 mL

## 2023-05-11 MED ORDER — HEPARIN SOD (PORK) LOCK FLUSH 100 UNIT/ML IV SOLN
500.0000 [IU] | Freq: Once | INTRAVENOUS | Status: AC | PRN
  Administered 2023-05-11: 500 [IU]

## 2023-05-11 NOTE — Patient Instructions (Signed)
[facility]  Discharge Instructions: Thank you for choosing [facility] to provide your oncology and hematology care.  If you have a lab appointment with the Cancer Center - please note that after April 8th, 2024, all labs will be drawn in the cancer center.  You do not have to check in or register with the main entrance as you have in the past but will complete your check-in in the cancer center.  Wear comfortable clothing and clothing appropriate for easy access to any Portacath or PICC line.   We strive to give you quality time with your provider. You may need to reschedule your appointment if you arrive late (15 or more minutes).  Arriving late affects you and other patients whose appointments are after yours.  Also, if you miss three or more appointments without notifying the office, you may be dismissed from the clinic at the provider's discretion.      For prescription refill requests, have your pharmacy contact our office and allow 72 hours for refills to be completed.    Today you received the following chemotherapy and/or immunotherapy agents pump d/c.      Fluorouracil Injection What is this medication? FLUOROURACIL (flure oh YOOR a sil) treats some types of cancer. It works by slowing down the growth of cancer cells. This medicine may be used for other purposes; ask your health care provider or pharmacist if you have questions. COMMON BRAND NAME(S): Adrucil What should I tell my care team before I take this medication? They need to know if you have any of these conditions: Blood disorders Dihydropyrimidine dehydrogenase (DPD) deficiency Infection, such as chickenpox, cold sores, herpes Kidney disease Liver disease Poor nutrition Recent or ongoing radiation therapy An unusual or allergic reaction to fluorouracil, other medications, foods, dyes, or preservatives If you or your partner are pregnant or trying to get pregnant Breast-feeding How  should I use this medication? This medication is injected into a vein. It is administered by your care team in a hospital or clinic setting. Talk to your care team about the use of this medication in children. Special care may be needed. Overdosage: If you think you have taken too much of this medicine contact a poison control center or emergency room at once. NOTE: This medicine is only for you. Do not share this medicine with others. What if I miss a dose? Keep appointments for follow-up doses. It is important not to miss your dose. Call your care team if you are unable to keep an appointment. What may interact with this medication? Do not take this medication with any of the following: Live virus vaccines This medication may also interact with the following: Medications that treat or prevent blood clots, such as warfarin, enoxaparin, dalteparin This list may not describe all possible interactions. Give your health care provider a list of all the medicines, herbs, non-prescription drugs, or dietary supplements you use. Also tell them if you smoke, drink alcohol, or use illegal drugs. Some items may interact with your medicine. What should I watch for while using this medication? Your condition will be monitored carefully while you are receiving this medication. This medication may make you feel generally unwell. This is not uncommon as chemotherapy can affect healthy cells as well as cancer cells. Report any side effects. Continue your course of treatment even though you feel ill unless your care team tells you to stop. In some cases, you may be given additional medications to help   with side effects. Follow all directions for their use. This medication may increase your risk of getting an infection. Call your care team for advice if you get a fever, chills, sore throat, or other symptoms of a cold or flu. Do not treat yourself. Try to avoid being around people who are sick. This medication may  increase your risk to bruise or bleed. Call your care team if you notice any unusual bleeding. Be careful brushing or flossing your teeth or using a toothpick because you may get an infection or bleed more easily. If you have any dental work done, tell your dentist you are receiving this medication. Avoid taking medications that contain aspirin, acetaminophen, ibuprofen, naproxen, or ketoprofen unless instructed by your care team. These medications may hide a fever. Do not treat diarrhea with over the counter products. Contact your care team if you have diarrhea that lasts more than 2 days or if it is severe and watery. This medication can make you more sensitive to the sun. Keep out of the sun. If you cannot avoid being in the sun, wear protective clothing and sunscreen. Do not use sun lamps, tanning beds, or tanning booths. Talk to your care team if you or your partner wish to become pregnant or think you might be pregnant. This medication can cause serious birth defects if taken during pregnancy and for 3 months after the last dose. A reliable form of contraception is recommended while taking this medication and for 3 months after the last dose. Talk to your care team about effective forms of contraception. Do not father a child while taking this medication and for 3 months after the last dose. Use a condom while having sex during this time period. Do not breastfeed while taking this medication. This medication may cause infertility. Talk to your care team if you are concerned about your fertility. What side effects may I notice from receiving this medication? Side effects that you should report to your care team as soon as possible: Allergic reactions--skin rash, itching, hives, swelling of the face, lips, tongue, or throat Heart attack--pain or tightness in the chest, shoulders, arms, or jaw, nausea, shortness of breath, cold or clammy skin, feeling faint or lightheaded Heart failure--shortness of  breath, swelling of the ankles, feet, or hands, sudden weight gain, unusual weakness or fatigue Heart rhythm changes--fast or irregular heartbeat, dizziness, feeling faint or lightheaded, chest pain, trouble breathing High ammonia level--unusual weakness or fatigue, confusion, loss of appetite, nausea, vomiting, seizures Infection--fever, chills, cough, sore throat, wounds that don't heal, pain or trouble when passing urine, general feeling of discomfort or being unwell Low red blood cell level--unusual weakness or fatigue, dizziness, headache, trouble breathing Pain, tingling, or numbness in the hands or feet, muscle weakness, change in vision, confusion or trouble speaking, loss of balance or coordination, trouble walking, seizures Redness, swelling, and blistering of the skin over hands and feet Severe or prolonged diarrhea Unusual bruising or bleeding Side effects that usually do not require medical attention (report to your care team if they continue or are bothersome): Dry skin Headache Increased tears Nausea Pain, redness, or swelling with sores inside the mouth or throat Sensitivity to light Vomiting This list may not describe all possible side effects. Call your doctor for medical advice about side effects. You may report side effects to FDA at 1-800-FDA-1088. Where should I keep my medication? This medication is given in a hospital or clinic. It will not be stored at home. NOTE: This sheet   is a summary. It may not cover all possible information. If you have questions about this medicine, talk to your doctor, pharmacist, or health care provider.  2024 Elsevier/Gold Standard (2022-04-04 00:00:00)  To help prevent nausea and vomiting after your treatment, we encourage you to take your nausea medication as directed.  BELOW ARE SYMPTOMS THAT SHOULD BE REPORTED IMMEDIATELY: *FEVER GREATER THAN 100.4 F (38 C) OR HIGHER *CHILLS OR SWEATING *NAUSEA AND VOMITING THAT IS NOT CONTROLLED  WITH YOUR NAUSEA MEDICATION *UNUSUAL SHORTNESS OF BREATH *UNUSUAL BRUISING OR BLEEDING *URINARY PROBLEMS (pain or burning when urinating, or frequent urination) *BOWEL PROBLEMS (unusual diarrhea, constipation, pain near the anus) TENDERNESS IN MOUTH AND THROAT WITH OR WITHOUT PRESENCE OF ULCERS (sore throat, sores in mouth, or a toothache) UNUSUAL RASH, SWELLING OR PAIN  UNUSUAL VAGINAL DISCHARGE OR ITCHING   Items with * indicate a potential emergency and should be followed up as soon as possible or go to the Emergency Department if any problems should occur.  Please show the CHEMOTHERAPY ALERT CARD or IMMUNOTHERAPY ALERT CARD at check-in to the Emergency Department and triage nurse.  Should you have questions after your visit or need to cancel or reschedule your appointment, please contact [facility] [phone]  and follow the prompts.  Office hours are 8:00 a.m. to 4:30 p.m. Monday - Friday. Please note that voicemails left after 4:00 p.m. may not be returned until the following business day.  We are closed weekends and major holidays. You have access to a nurse at all times for urgent questions. Please call the main number to the clinic [phone] and follow the prompts.  For any non-urgent questions, you may also contact your provider using MyChart. We now offer e-Visits for anyone 18 and older to request care online for non-urgent symptoms. For details visit [email or web address].   Also download the MyChart app! Go to the app store, search "MyChart", open the app, select [place], and log in with your MyChart username and password.   

## 2023-05-11 NOTE — Progress Notes (Signed)
Patient presents today for pump d/c. Vital signs are stable. Port a cath site clean, dry, and intact. Port flushed with 10 mls of Normal Saline and 500 Units of Heparin. Needle removed intact. Band aid applied. Patient has no complaints at this time. Discharged from clinic ambulatory and in stable condition. Patient alert and oriented.  

## 2023-05-12 ENCOUNTER — Encounter: Payer: Self-pay | Admitting: Hematology

## 2023-05-12 ENCOUNTER — Encounter (HOSPITAL_COMMUNITY): Payer: Self-pay | Admitting: Hematology

## 2023-05-17 ENCOUNTER — Ambulatory Visit (HOSPITAL_COMMUNITY): Admission: RE | Admit: 2023-05-17 | Payer: Medicare Other | Source: Ambulatory Visit

## 2023-05-22 NOTE — Progress Notes (Signed)
[facility] [address] [place] [address]    Clinic Day:  06/22/2023  Referring physician: [name], PA  Patient Care Team: [name], [name] as PCP - General (Physician Assistant) [name], MD as Medical Oncologist (Medical Oncology) Therese Sarah, RN as Oncology Nurse Navigator (Medical Oncology)   ASSESSMENT & PLAN:   Assessment: 1. Left lung mass with multiple lung nodules: - Presentation with dry cough for 6 months. - 30 pound weight loss in the last couple of years, weight stable over the last 6 months. - CT chest with contrast on 12/16/2021 showed bulky left hilar mass measuring 8.1 x 7.5 cm.  Numerous bilateral lung nodules of varying sizes.  Left adrenal nodule measuring 2.8 x 2.2 cm.  Mediastinal and bilateral hilar adenopathy with the largest pretracheal node measuring 4.1 x 3.1 cm. - MRI of the brain from 01/04/2022 which was negative for metastatic disease. - CTAP from 01/03/2022 which showed isolated left adrenal metastasis with no other evidence of metastatic disease in the abdomen or pelvis. - Pathology of left lung biopsy which shows adenocarcinoma with necrosis.  CK20 positive and CDX2 positive but negative for CK7 indicating colonic primary. 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  HER2 negative.  TMB low.  MSI-stable.  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  Other targetable mutations negative. - FOLFIRI started on 02/22/2022, bevacizumab added with cycle 4  - CT CAP (05/17/2022): Mediastinal and hilar lymph nodes have decreased in size.  Largest perihilar left lower lobe lung mass has decreased in size.  Right upper lobe mass slightly increased in size.  Other nodules are stable.  Left adrenal mass decreased to 1.3 cm from 2.8 cm. - CT scan showed mixed response as it was compared to CT from 12/16/2021.  He did not start chemotherapy until 02/22/2022. - Maintenance 5-FU and bevacizumab from September 2023.   2. Social/family  history: - Lives by himself.  He paints houses for living. - He quit smoking 12 years back and started back again 1 and half year ago and smoked half pack per day.  He quit again about 1 week ago. - Father had cancer, type unknown to the patient.  Brother died of brain tumor.  3.  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  Margins negative.  No lymphovascular or perineural invasion. - Received 3 cycles of XELOX followed by Xeloda for total of 6 months.  Oxaliplatin discontinued during cycle 4 secondary to transaminitis, elevated bilirubin and thrombocytopenia.  However he was also treated for hep C with Harvoni after that.    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 - Last CEA improved to 64. - Labs today: Normal CBC with platelet count 140.  LFTs are normal.  UA was negative for protein. - Proceed with maintenance treatment every 2 weeks. - Recommend follow-up after CT scan of CAP.  2.  Lower rib/epigastric pain: - Continue oxycodone 10 mg every 4 hours as needed.  Pain is well-controlled.  3.  Difficulty falling asleep: - He is not requiring trazodone on regular basis.  Continue as needed.  4.  Hypertension: - Continue amlodipine 10 mg daily.  Blood pressure today is well-controlled.    No orders of the defined types were placed in this  encounter.     I,Katie Daubenspeck,acting as a Neurosurgeon for [name], MD.,have documented all relevant documentation on the behalf of [name], MD,as directed by  [name], MD while in the presence of [name], MD.   I, [name] MD, have reviewed the above documentation for accuracy and completeness, and I agree with the above.   [name], MD   7/12/20245:45 PM  CHIEF  COMPLAINT:   Diagnosis: metastatic colon cancer to the lungs and left adrenal gland    Cancer Staging  Cecal cancer [facility]) Staging form: Colon and Rectum, AJCC 8th Edition - Clinical stage from 10/07/2019: Stage IIIB (cT3, cN1, cM0) - Signed by [name], MD on 10/07/2019 - Pathologic stage from 01/23/2022: Stage IVB (rpTX, pN0, pM1b) - Unsigned    Prior Therapy: FOLFIRI 02/22/22 - 08/08/22   Current Therapy:  maintenance 5-FU + bevacizumab    HISTORY OF PRESENT ILLNESS:   Oncology History  Cecal cancer (HCC)  10/07/2019 Initial Diagnosis   Cecal cancer (HCC)   10/07/2019 Cancer Staging   Staging form: Colon and Rectum, AJCC 8th Edition - Clinical stage from 10/07/2019: Stage IIIB (cT3, cN1, cM0) - Signed by [name], MD on 10/07/2019   02/22/2022 - 08/08/2022 Chemotherapy   Patient is on Treatment Plan : COLORECTAL FOLFIRI / BEVACIZUMAB Q14D     02/22/2022 -  Chemotherapy   Patient is on Treatment Plan : COLORECTAL FOLFIRI + Bevacizumab q14d        INTERVAL HISTORY:   Giani is a 67 y.o. male presenting to clinic today for follow up of metastatic colon cancer to the lungs and left adrenal gland. He was last seen by me on 04/24/23.  Today, he states that he is doing well overall. His appetite level is at 100%. His energy level is at 100%.  PAST MEDICAL HISTORY:   Past Medical History: Past Medical History:  Diagnosis Date   Arthritis    Colon cancer (HCC)    colon   Hepatitis C    Hypertension    Port-A-Cath in place 02/15/2022    Surgical History: Past Surgical History:  Procedure Laterality Date   BIOPSY  03/12/2023   Procedure: BIOPSY;  Surgeon: Lanelle Bal, DO;  [facility];  Service: Endoscopy;;   COLON SURGERY     ESOPHAGEAL BANDING N/A 03/12/2023   Procedure: ESOPHAGEAL BANDING;  Surgeon: Lanelle Bal, DO;  Location: [facility];  Service: Endoscopy;  Laterality: N/A;   ESOPHAGOGASTRODUODENOSCOPY (EGD) WITH  PROPOFOL N/A 03/12/2023   Procedure: ESOPHAGOGASTRODUODENOSCOPY (EGD) WITH PROPOFOL;  Surgeon: Lanelle Bal, DO;  Location: [facility];  Service: Endoscopy;  Laterality: N/A;  11:30AM; ASA 3   PORTACATH PLACEMENT Right 02/08/2022   Procedure: INSERTION PORT-A-CATH- RIJ;  Surgeon: Lewie Chamber, DO;  Location: [facility];  Service: General;  Laterality: Right;   REPLACEMENT TOTAL KNEE Left    SHOULDER ARTHROSCOPY Bilateral    TOTAL HIP ARTHROPLASTY Right 11/09/2020   Procedure: TOTAL HIP ARTHROPLASTY ANTERIOR APPROACH;  Surgeon: Sheral Apley, MD;  Location: [facility];  Service: Orthopedics;  Laterality: Right;   WRIST SURGERY Left     Social History: Social History   Socioeconomic History   Marital status: Married    Spouse name: Not on file   Number of children: 7   Years of education: Not on file   Highest education level: Not on file  Occupational History   Occupation: employed  Tobacco Use   Smoking status:  Former    Current packs/day: 0.00    Average packs/day: 1.5 packs/day for 20.0 years (30.0 ttl pk-yrs)    Types: Cigarettes    Start date: 11/19/1985    Quit date: 11/19/2005    Years since quitting: 17.6   Smokeless tobacco: Never  Vaping Use   Vaping status: Never Used  Substance and Sexual Activity   Alcohol use: Not Currently   Drug use: Never   Sexual activity: Yes  Other Topics Concern   Not on file  Social History Narrative   ** Merged History Encounter **       Separated from wife 11/2021   Social Determinants of Health   Financial Resource Strain: Low Risk  (10/07/2019)   Overall Financial Resource Strain (CARDIA)    Difficulty of Paying Living Expenses: Not very hard  Food Insecurity: No Food Insecurity (10/07/2019)   Hunger Vital Sign    Worried About Running Out of Food in the Last Year: Never true    Ran Out of Food in the Last Year: Never true  Transportation Needs: No Transportation Needs (10/07/2019)   PRAPARE - Therapist, art (Medical): No    Lack of Transportation (Non-Medical): No  Physical Activity: Inactive (10/07/2019)   Exercise Vital Sign    Days of Exercise per Week: 0 days    Minutes of Exercise per Session: 0 min  Stress: No Stress Concern Present (10/07/2019)   [employer] of Occupational Health - Occupational Stress Questionnaire    Feeling of Stress : Not at all  Social Connections: Moderately Integrated (10/07/2019)   Social Connection and Isolation Panel [NHANES]    Frequency of Communication with Friends and Family: Once a week    Frequency of Social Gatherings with Friends and Family: Once a week    Attends Religious Services: More than 4 times per year    Active Member of [employer] or Organizations: Yes    Attends Engineer, structural: More than 4 times per year    Marital Status: Married  Catering manager Violence: Not At Risk (10/07/2019)   Humiliation, Afraid, Rape, and Kick questionnaire    Fear of Current or Ex-Partner: No    Emotionally Abused: No    Physically Abused: No    Sexually Abused: No    Family History: Family History  Problem Relation Age of Onset   Heart disease Mother    Dementia Father    Heart disease Sister    Cancer Brother    Hypertension Brother    Hypertension Brother    Healthy Son    Healthy Son    Healthy Son    Healthy Son    Healthy Daughter    Healthy Daughter    Healthy Daughter     Current Medications:  Current Outpatient Medications:    aluminum-magnesium hydroxide-simethicone (MAALOX) 200-200-20 MG/5ML SUSP, Take 30 mLs by mouth 4 (four) times daily -  before meals and at bedtime., Disp: 1680 mL, Rfl: 2   amLODipine (NORVASC) 10 MG tablet, Take 1 tablet (10 mg total) by mouth daily., Disp: 90 tablet, Rfl: 3   Bevacizumab (AVASTIN IV), Inject into the vein every 14 (fourteen) days. *start date TBD, Disp: , Rfl:    fluorouracil CALGB 16109 2,400 mg/m2 in sodium chloride 0.9 % 150 mL, Inject 2,400 mg/m2  into the vein over 48 hr., Disp: , Rfl:    FLUOROURACIL IV, Inject into the vein every 14 (fourteen) days., Disp: , Rfl:  LEUCOVORIN CALCIUM IV, Inject into the vein every 14 (fourteen) days., Disp: , Rfl:    pantoprazole (PROTONIX) 40 MG tablet, Take 1 tablet (40 mg total) by mouth daily., Disp: 30 tablet, Rfl: 11   potassium chloride SA (KLOR-CON M) 20 MEQ tablet, Take 1 tablet (20 mEq total) by mouth daily., Disp: 30 tablet, Rfl: 4   Lactulose 20 GM/30ML SOLN, Take 15 mLs (10 g total) by mouth at bedtime. Take 15 ml at bedtime every night to assist with regular bowel movements.  Titrate down if having multiple bowel movements.  If a bowel movement has not occurred in 3 to 4 days or longer, then take 15 ml every 3 hours until a bowel movent has occurred., Disp: 450 mL, Rfl: 5   Oxycodone HCl 10 MG TABS, Take 1 tablet (10 mg total) by mouth every 4 (four) hours as needed., Disp: 168 tablet, Rfl: 0 No current facility-administered medications for this visit.  Facility-Administered Medications Ordered in Other Visits:    sodium chloride flush (NS) 0.9 % injection 10 mL, 10 mL, Intracatheter, PRN, [name], MD, 10 mL at 06/22/23 1330   Allergies: No Known Allergies  REVIEW OF SYSTEMS:   Review of Systems  Constitutional:  Negative for chills, fatigue and fever.  HENT:   Negative for lump/mass, mouth sores, nosebleeds, sore throat and trouble swallowing.   Eyes:  Negative for eye problems.  Respiratory:  Positive for shortness of breath. Negative for cough.   Cardiovascular:  Negative for chest pain, leg swelling and palpitations.  Gastrointestinal:  Positive for constipation. Negative for abdominal pain, diarrhea, nausea and vomiting.  Genitourinary:  Negative for bladder incontinence, difficulty urinating, dysuria, frequency, hematuria and nocturia.   Musculoskeletal:  Negative for arthralgias, back pain, flank pain, myalgias and neck pain.  Skin:  Negative for itching and  rash.  Neurological:  Negative for dizziness, headaches and numbness.  Hematological:  Does not bruise/bleed easily.  Psychiatric/Behavioral:  Negative for depression, sleep disturbance and suicidal ideas. The patient is not nervous/anxious.   All other systems reviewed and are negative.    VITALS:   Blood pressure 110/78, pulse 62, temperature 97.8 F (36.6 C), temperature source Oral, resp. rate 18, weight 183 lb 3.2 oz (83.1 kg), SpO2 100%.  Wt Readings from Last 3 Encounters:  06/20/23 183 lb 9.6 oz (83.3 kg)  06/06/23 181 lb 6.4 oz (82.3 kg)  05/23/23 183 lb 3.2 oz (83.1 kg)    Body mass index is 26.29 kg/m.  Performance status (ECOG): 1 - Symptomatic but completely ambulatory  PHYSICAL EXAM:   Physical Exam Vitals and nursing note reviewed. Exam conducted with a chaperone present.  Constitutional:      Appearance: Normal appearance.  Cardiovascular:     Rate and Rhythm: Normal rate and regular rhythm.     Pulses: Normal pulses.     Heart sounds: Normal heart sounds.  Pulmonary:     Effort: Pulmonary effort is normal.     Breath sounds: Normal breath sounds.  Abdominal:     Palpations: Abdomen is soft. There is no hepatomegaly, splenomegaly or mass.     Tenderness: There is no abdominal tenderness.  Musculoskeletal:     Right lower leg: No edema.     Left lower leg: No edema.  Lymphadenopathy:     Cervical: No cervical adenopathy.     Right cervical: No superficial, deep or posterior cervical adenopathy.    Left cervical: No superficial, deep or posterior cervical adenopathy.  Upper Body:     Right upper body: No supraclavicular or axillary adenopathy.     Left upper body: No supraclavicular or axillary adenopathy.  Neurological:     General: No focal deficit present.     Mental Status: He is alert and oriented to person, place, and time.  Psychiatric:        Mood and Affect: Mood normal.        Behavior: Behavior normal.     LABS:      Latest Ref  Rng & Units 06/20/2023   10:00 AM 06/06/2023   10:28 AM 05/23/2023   10:10 AM  CBC  WBC 4.0 - 10.5 K/uL 5.7  5.2  5.6   Hemoglobin 13.0 - 17.0 g/dL 16.1  09.6  04.5   Hematocrit 39.0 - 52.0 % 36.6  37.9  37.6   Platelets 150 - 400 K/uL 121  136  140       Latest Ref Rng & Units 06/20/2023   10:00 AM 06/06/2023   10:28 AM 05/23/2023   10:10 AM  CMP  Glucose 70 - 99 mg/dL 409  811  914   BUN 8 - 23 mg/dL 11  15  10    Creatinine 0.61 - 1.24 mg/dL 7.82  9.56  2.13   Sodium 135 - 145 mmol/L 133  134  135   Potassium 3.5 - 5.1 mmol/L 3.8  3.8  3.3   Chloride 98 - 111 mmol/L 101  100  103   CO2 22 - 32 mmol/L 24  25  25    Calcium 8.9 - 10.3 mg/dL 9.0  9.2  8.8   Total Protein 6.5 - 8.1 g/dL 7.5  7.8  7.5   Total Bilirubin 0.3 - 1.2 mg/dL 1.7  1.5  1.0   Alkaline Phos 38 - 126 U/L 38  37  39   AST 15 - 41 U/L 23  20  21    ALT 0 - 44 U/L 9  7  9       Lab Results  Component Value Date   CEA1 59.2 (H) 05/23/2023   /  CEA  Date Value Ref Range Status  05/23/2023 59.2 (H) 0.0 - 4.7 ng/mL Final    Comment:    (NOTE)                             Nonsmokers          <3.9                             Smokers             <5.6 Roche [employer] Electrochemiluminescence Immunoassay (ECLIA) Values obtained with different assay methods or kits cannot be used interchangeably.  Results cannot be interpreted as absolute evidence of the presence or absence of malignant disease. Performed At: [facility] [address] [place], [place] [id number] Jolene Schimke MD GE:[id number]    No results found for: "PSA1" No results found for: "CAN199" No results found for: "CAN125"  No results found for: "TOTALPROTELP", "ALBUMINELP", "A1GS", "A2GS", "BETS", "BETA2SER", "GAMS", "MSPIKE", "SPEI" Lab Results  Component Value Date   TIBC 406 01/16/2023   TIBC 294 06/06/2022   TIBC 237 (L) 02/22/2022   FERRITIN 150 01/16/2023   FERRITIN 243 06/06/2022   FERRITIN 292 02/22/2022   IRONPCTSAT 18  01/16/2023   IRONPCTSAT 24 06/06/2022   IRONPCTSAT  11 (L) 02/22/2022   Lab Results  Component Value Date   LDH 258 (H) 12/26/2021     STUDIES:   No results found.

## 2023-05-23 ENCOUNTER — Inpatient Hospital Stay: Payer: Medicare Other | Attending: Hematology

## 2023-05-23 ENCOUNTER — Inpatient Hospital Stay (HOSPITAL_BASED_OUTPATIENT_CLINIC_OR_DEPARTMENT_OTHER): Payer: Medicare Other | Admitting: Hematology

## 2023-05-23 ENCOUNTER — Inpatient Hospital Stay: Payer: Medicare Other

## 2023-05-23 VITALS — BP 132/88 | HR 56 | Temp 97.3°F

## 2023-05-23 DIAGNOSIS — Z79899 Other long term (current) drug therapy: Secondary | ICD-10-CM | POA: Insufficient documentation

## 2023-05-23 DIAGNOSIS — C7802 Secondary malignant neoplasm of left lung: Secondary | ICD-10-CM | POA: Insufficient documentation

## 2023-05-23 DIAGNOSIS — Z87891 Personal history of nicotine dependence: Secondary | ICD-10-CM | POA: Insufficient documentation

## 2023-05-23 DIAGNOSIS — C189 Malignant neoplasm of colon, unspecified: Secondary | ICD-10-CM | POA: Diagnosis not present

## 2023-05-23 DIAGNOSIS — Z5111 Encounter for antineoplastic chemotherapy: Secondary | ICD-10-CM | POA: Diagnosis not present

## 2023-05-23 DIAGNOSIS — Z95828 Presence of other vascular implants and grafts: Secondary | ICD-10-CM

## 2023-05-23 DIAGNOSIS — K5900 Constipation, unspecified: Secondary | ICD-10-CM

## 2023-05-23 DIAGNOSIS — C7972 Secondary malignant neoplasm of left adrenal gland: Secondary | ICD-10-CM | POA: Diagnosis not present

## 2023-05-23 DIAGNOSIS — Z5112 Encounter for antineoplastic immunotherapy: Secondary | ICD-10-CM | POA: Insufficient documentation

## 2023-05-23 DIAGNOSIS — C180 Malignant neoplasm of cecum: Secondary | ICD-10-CM | POA: Diagnosis present

## 2023-05-23 DIAGNOSIS — C7801 Secondary malignant neoplasm of right lung: Secondary | ICD-10-CM | POA: Diagnosis not present

## 2023-05-23 DIAGNOSIS — I10 Essential (primary) hypertension: Secondary | ICD-10-CM | POA: Diagnosis not present

## 2023-05-23 LAB — CBC WITH DIFFERENTIAL/PLATELET
Abs Immature Granulocytes: 0.01 10*3/uL (ref 0.00–0.07)
Basophils Absolute: 0.1 10*3/uL (ref 0.0–0.1)
Basophils Relative: 1 %
Eosinophils Absolute: 0.2 10*3/uL (ref 0.0–0.5)
Eosinophils Relative: 4 %
HCT: 37.6 % — ABNORMAL LOW (ref 39.0–52.0)
Hemoglobin: 12.2 g/dL — ABNORMAL LOW (ref 13.0–17.0)
Immature Granulocytes: 0 %
Lymphocytes Relative: 49 %
Lymphs Abs: 2.7 10*3/uL (ref 0.7–4.0)
MCH: 31 pg (ref 26.0–34.0)
MCHC: 32.4 g/dL (ref 30.0–36.0)
MCV: 95.4 fL (ref 80.0–100.0)
Monocytes Absolute: 0.8 10*3/uL (ref 0.1–1.0)
Monocytes Relative: 14 %
Neutro Abs: 1.8 10*3/uL (ref 1.7–7.7)
Neutrophils Relative %: 32 %
Platelets: 140 10*3/uL — ABNORMAL LOW (ref 150–400)
RBC: 3.94 MIL/uL — ABNORMAL LOW (ref 4.22–5.81)
RDW: 14.6 % (ref 11.5–15.5)
WBC: 5.6 10*3/uL (ref 4.0–10.5)
nRBC: 0 % (ref 0.0–0.2)

## 2023-05-23 LAB — URINALYSIS, DIPSTICK ONLY
Bilirubin Urine: NEGATIVE
Glucose, UA: NEGATIVE mg/dL
Hgb urine dipstick: NEGATIVE
Ketones, ur: NEGATIVE mg/dL
Leukocytes,Ua: NEGATIVE
Nitrite: NEGATIVE
Protein, ur: 30 mg/dL — AB
Specific Gravity, Urine: 1.023 (ref 1.005–1.030)
pH: 5 (ref 5.0–8.0)

## 2023-05-23 LAB — COMPREHENSIVE METABOLIC PANEL
ALT: 9 U/L (ref 0–44)
AST: 21 U/L (ref 15–41)
Albumin: 3.8 g/dL (ref 3.5–5.0)
Alkaline Phosphatase: 39 U/L (ref 38–126)
Anion gap: 7 (ref 5–15)
BUN: 10 mg/dL (ref 8–23)
CO2: 25 mmol/L (ref 22–32)
Calcium: 8.8 mg/dL — ABNORMAL LOW (ref 8.9–10.3)
Chloride: 103 mmol/L (ref 98–111)
Creatinine, Ser: 1.12 mg/dL (ref 0.61–1.24)
GFR, Estimated: >60 mL/min (ref >60)
Glucose, Bld: 130 mg/dL — ABNORMAL HIGH (ref 70–99)
Potassium: 3.3 mmol/L — ABNORMAL LOW (ref 3.5–5.1)
Sodium: 135 mmol/L (ref 135–145)
Total Bilirubin: 1 mg/dL (ref 0.3–1.2)
Total Protein: 7.5 g/dL (ref 6.5–8.1)

## 2023-05-23 LAB — MAGNESIUM: Magnesium: 1.8 mg/dL (ref 1.7–2.4)

## 2023-05-23 MED ORDER — SODIUM CHLORIDE 0.9 % IV SOLN
2400.0000 mg/m2 | INTRAVENOUS | Status: DC
  Administered 2023-05-23: 5000 mg via INTRAVENOUS
  Filled 2023-05-23: qty 100

## 2023-05-23 MED ORDER — SODIUM CHLORIDE 0.9 % IV SOLN: Freq: Once | INTRAVENOUS | Status: AC

## 2023-05-23 MED ORDER — SODIUM CHLORIDE 0.9 % IV SOLN
10.0000 mg | Freq: Once | INTRAVENOUS | Status: AC
  Administered 2023-05-23: 10 mg via INTRAVENOUS
  Filled 2023-05-23: qty 10

## 2023-05-23 MED ORDER — FLUOROURACIL CHEMO INJECTION 2.5 GM/50ML
400.0000 mg/m2 | Freq: Once | INTRAVENOUS | Status: AC
  Administered 2023-05-23: 800 mg via INTRAVENOUS
  Filled 2023-05-23: qty 16

## 2023-05-23 MED ORDER — PALONOSETRON HCL INJECTION 0.25 MG/5ML
0.2500 mg | Freq: Once | INTRAVENOUS | Status: AC
  Administered 2023-05-23: 0.25 mg via INTRAVENOUS
  Filled 2023-05-23: qty 5

## 2023-05-23 MED ORDER — SODIUM CHLORIDE 0.9 % IV SOLN
2400.0000 mg/m2 | INTRAVENOUS | Status: DC
  Filled 2023-05-23: qty 100

## 2023-05-23 MED ORDER — SODIUM CHLORIDE 0.9% FLUSH
10.0000 mL | Freq: Once | INTRAVENOUS | Status: AC
  Administered 2023-05-23: 10 mL via INTRAVENOUS

## 2023-05-23 MED ORDER — SODIUM CHLORIDE 0.9 % IV SOLN
5.0000 mg/kg | Freq: Once | INTRAVENOUS | Status: AC
  Administered 2023-05-23: 400 mg via INTRAVENOUS
  Filled 2023-05-23: qty 16

## 2023-05-23 MED ORDER — SODIUM CHLORIDE 0.9 % IV SOLN
400.0000 mg/m2 | Freq: Once | INTRAVENOUS | Status: AC
  Administered 2023-05-23: 780 mg via INTRAVENOUS
  Filled 2023-05-23: qty 39

## 2023-05-23 MED ORDER — POTASSIUM CHLORIDE CRYS ER 20 MEQ PO TBCR: 20.0000 meq | EXTENDED_RELEASE_TABLET | Freq: Every day | ORAL | 4 refills | Status: DC

## 2023-05-23 MED ORDER — LACTULOSE 20 GM/30ML PO SOLN
10.0000 g | Freq: Every day | ORAL | 5 refills | Status: AC
Start: 2023-05-23 — End: ?

## 2023-05-23 NOTE — Progress Notes (Signed)
Patient has been examined by Dr. Katragadda. Vital signs and labs have been reviewed by MD - ANC, Creatinine, LFTs, hemoglobin, and platelets are within treatment parameters per M.D. - pt may proceed with treatment.  Primary RN and pharmacy notified.  

## 2023-05-23 NOTE — Progress Notes (Signed)
Patient presents today for MVASI, leucovorin, with 5FU pump start per providers order.  Labs and vital signs reviewed by MD.  Message received from Chapman Moss RN/Dr. Ellin Saba, patient okay for treatment.  Treatment given today per MD orders.  Stable during infusion without adverse affects.  Vital signs stable.  5FU pump connected and verified RUN on the screen with the patient.  No complaints at this time.  Discharge from clinic ambulatory in stable condition.  Alert and oriented X 3.  Follow up with [facility] as scheduled.

## 2023-05-23 NOTE — Patient Instructions (Signed)
[facility]  Discharge Instructions: Thank you for choosing [facility] to provide your oncology and hematology care.  If you have a lab appointment with the Cancer Center - please note that after April 8th, 2024, all labs will be drawn in the cancer center.  You do not have to check in or register with the main entrance as you have in the past but will complete your check-in in the cancer center.  Wear comfortable clothing and clothing appropriate for easy access to any Portacath or PICC line.   We strive to give you quality time with your provider. You may need to reschedule your appointment if you arrive late (15 or more minutes).  Arriving late affects you and other patients whose appointments are after yours.  Also, if you miss three or more appointments without notifying the office, you may be dismissed from the clinic at the provider's discretion.      For prescription refill requests, have your pharmacy contact our office and allow 72 hours for refills to be completed.    Today you received the following chemotherapy and/or immunotherapy agents MVASI/Leucovorin/5FU      To help prevent nausea and vomiting after your treatment, we encourage you to take your nausea medication as directed.  BELOW ARE SYMPTOMS THAT SHOULD BE REPORTED IMMEDIATELY: *FEVER GREATER THAN 100.4 F (38 C) OR HIGHER *CHILLS OR SWEATING *NAUSEA AND VOMITING THAT IS NOT CONTROLLED WITH YOUR NAUSEA MEDICATION *UNUSUAL SHORTNESS OF BREATH *UNUSUAL BRUISING OR BLEEDING *URINARY PROBLEMS (pain or burning when urinating, or frequent urination) *BOWEL PROBLEMS (unusual diarrhea, constipation, pain near the anus) TENDERNESS IN MOUTH AND THROAT WITH OR WITHOUT PRESENCE OF ULCERS (sore throat, sores in mouth, or a toothache) UNUSUAL RASH, SWELLING OR PAIN  UNUSUAL VAGINAL DISCHARGE OR ITCHING   Items with * indicate a potential emergency and should be followed up as soon as possible or  go to the Emergency Department if any problems should occur.  Please show the CHEMOTHERAPY ALERT CARD or IMMUNOTHERAPY ALERT CARD at check-in to the Emergency Department and triage nurse.  Should you have questions after your visit or need to cancel or reschedule your appointment, please contact [facility] [phone]  and follow the prompts.  Office hours are 8:00 a.m. to 4:30 p.m. Monday - Friday. Please note that voicemails left after 4:00 p.m. may not be returned until the following business day.  We are closed weekends and major holidays. You have access to a nurse at all times for urgent questions. Please call the main number to the clinic [phone] and follow the prompts.  For any non-urgent questions, you may also contact your provider using MyChart. We now offer e-Visits for anyone 27 and older to request care online for non-urgent symptoms. For details visit [email or web address].   Also download the MyChart app! Go to the app store, search "MyChart", open the app, select [place], and log in with your MyChart username and password.

## 2023-05-23 NOTE — Patient Instructions (Signed)
[facility] at [facility] Discharge Instructions   You were seen and examined today by Dr. Ellin Saba.  He reviewed the results of your lab work which are normal/stable. Your CEA at last visit has gone up. We need to repeat a CT scan to make sure the cancer is still under control.   We will proceed with your treatment today.   Return as scheduled.    Thank you for choosing [facility] at [facility] to provide your oncology and hematology care.  To afford each patient quality time with our provider, please arrive at least 15 minutes before your scheduled appointment time.   If you have a lab appointment with the Cancer Center please come in thru the Main Entrance and check in at the main information desk.  You need to re-schedule your appointment should you arrive 10 or more minutes late.  We strive to give you quality time with our providers, and arriving late affects you and other patients whose appointments are after yours.  Also, if you no show three or more times for appointments you may be dismissed from the clinic at the providers discretion.     Again, thank you for choosing [facility].  Our hope is that these requests will decrease the amount of time that you wait before being seen by our physicians.       _____________________________________________________________  Should you have questions after your visit to [facility], please contact our office at [phone] and follow the prompts.  Our office hours are 8:00 a.m. and 4:30 p.m. Monday - Friday.  Please note that voicemails left after 4:00 p.m. may not be returned until the following business day.  We are closed weekends and major holidays.  You do have access to a nurse 24-7, just call the main number to the clinic [phone] and do not press any options, hold on the line and a nurse will answer the phone.    For prescription refill requests, have your  pharmacy contact our office and allow 72 hours.    Due to Covid, you will need to wear a mask upon entering the hospital. If you do not have a mask, a mask will be given to you at the Main Entrance upon arrival. For doctor visits, patients may have 1 support person age 36 or older with them. For treatment visits, patients can not have anyone with them due to social distancing guidelines and our immunocompromised population.

## 2023-05-24 LAB — CEA: CEA: 59.2 ng/mL — ABNORMAL HIGH (ref 0.0–4.7)

## 2023-05-25 ENCOUNTER — Inpatient Hospital Stay: Payer: Medicare Other

## 2023-05-25 VITALS — BP 113/77 | HR 77 | Temp 97.6°F | Resp 18

## 2023-05-25 DIAGNOSIS — Z79899 Other long term (current) drug therapy: Secondary | ICD-10-CM | POA: Diagnosis not present

## 2023-05-25 DIAGNOSIS — Z5111 Encounter for antineoplastic chemotherapy: Secondary | ICD-10-CM | POA: Diagnosis not present

## 2023-05-25 DIAGNOSIS — Z95828 Presence of other vascular implants and grafts: Secondary | ICD-10-CM

## 2023-05-25 DIAGNOSIS — C180 Malignant neoplasm of cecum: Secondary | ICD-10-CM | POA: Diagnosis not present

## 2023-05-25 DIAGNOSIS — I10 Essential (primary) hypertension: Secondary | ICD-10-CM | POA: Diagnosis not present

## 2023-05-25 DIAGNOSIS — C7802 Secondary malignant neoplasm of left lung: Secondary | ICD-10-CM | POA: Diagnosis not present

## 2023-05-25 DIAGNOSIS — Z5112 Encounter for antineoplastic immunotherapy: Secondary | ICD-10-CM | POA: Diagnosis not present

## 2023-05-25 DIAGNOSIS — Z87891 Personal history of nicotine dependence: Secondary | ICD-10-CM | POA: Diagnosis not present

## 2023-05-25 DIAGNOSIS — C7801 Secondary malignant neoplasm of right lung: Secondary | ICD-10-CM | POA: Diagnosis not present

## 2023-05-25 DIAGNOSIS — C7972 Secondary malignant neoplasm of left adrenal gland: Secondary | ICD-10-CM | POA: Diagnosis not present

## 2023-05-25 MED ORDER — SODIUM CHLORIDE 0.9% FLUSH
10.0000 mL | INTRAVENOUS | Status: DC | PRN
  Administered 2023-05-25: 10 mL

## 2023-05-25 MED ORDER — HEPARIN SOD (PORK) LOCK FLUSH 100 UNIT/ML IV SOLN
500.0000 [IU] | Freq: Once | INTRAVENOUS | Status: AC | PRN
  Administered 2023-05-25: 500 [IU]

## 2023-05-25 NOTE — Patient Instructions (Signed)
[facility] [place]  Discharge Instructions: Thank you for choosing [facility] to provide your oncology and hematology care.  If you have a lab appointment with the Cancer Center - please note that after April 8th, 2024, all labs will be drawn in the cancer center.  You do not have to check in or register with the main entrance as you have in the past but will complete your check-in in the cancer center.  Wear comfortable clothing and clothing appropriate for easy access to any Portacath or PICC line.   We strive to give you quality time with your provider. You may need to reschedule your appointment if you arrive late (15 or more minutes).  Arriving late affects you and other patients whose appointments are after yours.  Also, if you miss three or more appointments without notifying the office, you may be dismissed from the clinic at the provider's discretion.      For prescription refill requests, have your pharmacy contact our office and allow 72 hours for refills to be completed.  To help prevent nausea and vomiting after your treatment, we encourage you to take your nausea medication as directed.  BELOW ARE SYMPTOMS THAT SHOULD BE REPORTED IMMEDIATELY: *FEVER GREATER THAN 100.4 F (38 C) OR HIGHER *CHILLS OR SWEATING *NAUSEA AND VOMITING THAT IS NOT CONTROLLED WITH YOUR NAUSEA MEDICATION *UNUSUAL SHORTNESS OF BREATH *UNUSUAL BRUISING OR BLEEDING *URINARY PROBLEMS (pain or burning when urinating, or frequent urination) *BOWEL PROBLEMS (unusual diarrhea, constipation, pain near the anus) TENDERNESS IN MOUTH AND THROAT WITH OR WITHOUT PRESENCE OF ULCERS (sore throat, sores in mouth, or a toothache) UNUSUAL RASH, SWELLING OR PAIN  UNUSUAL VAGINAL DISCHARGE OR ITCHING   Items with * indicate a potential emergency and should be followed up as soon as possible or go to the Emergency Department if any problems should occur.  Please show the CHEMOTHERAPY ALERT CARD or  IMMUNOTHERAPY ALERT CARD at check-in to the Emergency Department and triage nurse.  Should you have questions after your visit or need to cancel or reschedule your appointment, please contact [facility] [place] [facility] [phone]  and follow the prompts.  Office hours are 8:00 a.m. to 4:30 p.m. Monday - Friday. Please note that voicemails left after 4:00 p.m. may not be returned until the following business day.  We are closed weekends and major holidays. You have access to a nurse at all times for urgent questions. Please call the main number to the clinic [phone] and follow the prompts.  For any non-urgent questions, you may also contact your provider using MyChart. We now offer e-Visits for anyone 18 and older to request care online for non-urgent symptoms. For details visit [email or web address].   Also download the MyChart app! Go to the app store, search "MyChart", open the app, select [place], and log in with your MyChart username and password.   

## 2023-05-25 NOTE — Progress Notes (Signed)
Patients port flushed without difficulty.  Good blood return noted with no bruising or swelling noted at site.  Band aid applied. Home infusion 5FU pump disconnected with no issues.  VSS with discharge and left in satisfactory condition with no s/s of distress noted.   

## 2023-05-26 DIAGNOSIS — C189 Malignant neoplasm of colon, unspecified: Secondary | ICD-10-CM | POA: Diagnosis not present

## 2023-06-01 ENCOUNTER — Ambulatory Visit (HOSPITAL_COMMUNITY): Admission: RE | Admit: 2023-06-01 | Payer: Medicare Other | Source: Ambulatory Visit

## 2023-06-04 ENCOUNTER — Ambulatory Visit (HOSPITAL_COMMUNITY): Payer: Medicare Other

## 2023-06-05 ENCOUNTER — Other Ambulatory Visit: Payer: Self-pay | Admitting: *Deleted

## 2023-06-05 MED ORDER — OXYCODONE HCL 10 MG PO TABS: 10.0000 mg | ORAL_TABLET | ORAL | 0 refills | Status: DC | PRN

## 2023-06-06 ENCOUNTER — Inpatient Hospital Stay: Payer: Medicare Other

## 2023-06-06 ENCOUNTER — Inpatient Hospital Stay: Payer: Medicare Other | Admitting: Hematology

## 2023-06-06 VITALS — BP 120/75 | HR 52 | Temp 96.5°F | Resp 18

## 2023-06-06 DIAGNOSIS — C7972 Secondary malignant neoplasm of left adrenal gland: Secondary | ICD-10-CM | POA: Diagnosis not present

## 2023-06-06 DIAGNOSIS — Z95828 Presence of other vascular implants and grafts: Secondary | ICD-10-CM

## 2023-06-06 DIAGNOSIS — C180 Malignant neoplasm of cecum: Secondary | ICD-10-CM | POA: Diagnosis not present

## 2023-06-06 DIAGNOSIS — I10 Essential (primary) hypertension: Secondary | ICD-10-CM | POA: Diagnosis not present

## 2023-06-06 DIAGNOSIS — Z5112 Encounter for antineoplastic immunotherapy: Secondary | ICD-10-CM | POA: Diagnosis not present

## 2023-06-06 DIAGNOSIS — Z79899 Other long term (current) drug therapy: Secondary | ICD-10-CM | POA: Diagnosis not present

## 2023-06-06 DIAGNOSIS — Z5111 Encounter for antineoplastic chemotherapy: Secondary | ICD-10-CM | POA: Diagnosis not present

## 2023-06-06 DIAGNOSIS — C7802 Secondary malignant neoplasm of left lung: Secondary | ICD-10-CM | POA: Diagnosis not present

## 2023-06-06 DIAGNOSIS — C189 Malignant neoplasm of colon, unspecified: Secondary | ICD-10-CM | POA: Diagnosis not present

## 2023-06-06 DIAGNOSIS — Z87891 Personal history of nicotine dependence: Secondary | ICD-10-CM | POA: Diagnosis not present

## 2023-06-06 DIAGNOSIS — C7801 Secondary malignant neoplasm of right lung: Secondary | ICD-10-CM | POA: Diagnosis not present

## 2023-06-06 LAB — CBC WITH DIFFERENTIAL/PLATELET
Abs Immature Granulocytes: 0.03 10*3/uL (ref 0.00–0.07)
Basophils Absolute: 0.1 10*3/uL (ref 0.0–0.1)
Basophils Relative: 2 %
Eosinophils Absolute: 0.2 10*3/uL (ref 0.0–0.5)
Eosinophils Relative: 4 %
HCT: 37.9 % — ABNORMAL LOW (ref 39.0–52.0)
Hemoglobin: 12.3 g/dL — ABNORMAL LOW (ref 13.0–17.0)
Immature Granulocytes: 1 %
Lymphocytes Relative: 43 %
Lymphs Abs: 2.3 10*3/uL (ref 0.7–4.0)
MCH: 30.8 pg (ref 26.0–34.0)
MCHC: 32.5 g/dL (ref 30.0–36.0)
MCV: 95 fL (ref 80.0–100.0)
Monocytes Absolute: 0.8 10*3/uL (ref 0.1–1.0)
Monocytes Relative: 16 %
Neutro Abs: 1.7 10*3/uL (ref 1.7–7.7)
Neutrophils Relative %: 34 %
Platelets: 136 10*3/uL — ABNORMAL LOW (ref 150–400)
RBC: 3.99 MIL/uL — ABNORMAL LOW (ref 4.22–5.81)
RDW: 14.7 % (ref 11.5–15.5)
WBC: 5.2 10*3/uL (ref 4.0–10.5)
nRBC: 0 % (ref 0.0–0.2)

## 2023-06-06 LAB — COMPREHENSIVE METABOLIC PANEL
ALT: 7 U/L (ref 0–44)
AST: 20 U/L (ref 15–41)
Albumin: 4.2 g/dL (ref 3.5–5.0)
Alkaline Phosphatase: 37 U/L — ABNORMAL LOW (ref 38–126)
Anion gap: 9 (ref 5–15)
BUN: 15 mg/dL (ref 8–23)
CO2: 25 mmol/L (ref 22–32)
Calcium: 9.2 mg/dL (ref 8.9–10.3)
Chloride: 100 mmol/L (ref 98–111)
Creatinine, Ser: 1.31 mg/dL — ABNORMAL HIGH (ref 0.61–1.24)
GFR, Estimated: >60 mL/min (ref >60)
Glucose, Bld: 117 mg/dL — ABNORMAL HIGH (ref 70–99)
Potassium: 3.8 mmol/L (ref 3.5–5.1)
Sodium: 134 mmol/L — ABNORMAL LOW (ref 135–145)
Total Bilirubin: 1.5 mg/dL — ABNORMAL HIGH (ref 0.3–1.2)
Total Protein: 7.8 g/dL (ref 6.5–8.1)

## 2023-06-06 LAB — MAGNESIUM: Magnesium: 2.1 mg/dL (ref 1.7–2.4)

## 2023-06-06 MED ORDER — SODIUM CHLORIDE 0.9 % IV SOLN
5.0000 mg/kg | Freq: Once | INTRAVENOUS | Status: AC
  Administered 2023-06-06: 400 mg via INTRAVENOUS
  Filled 2023-06-06: qty 16

## 2023-06-06 MED ORDER — SODIUM CHLORIDE 0.9 % IV SOLN
2400.0000 mg/m2 | INTRAVENOUS | Status: DC
  Administered 2023-06-06: 5000 mg via INTRAVENOUS
  Filled 2023-06-06: qty 100

## 2023-06-06 MED ORDER — SODIUM CHLORIDE 0.9 % IV SOLN
10.0000 mg | Freq: Once | INTRAVENOUS | Status: AC
  Administered 2023-06-06: 10 mg via INTRAVENOUS
  Filled 2023-06-06: qty 10

## 2023-06-06 MED ORDER — SODIUM CHLORIDE 0.9 % IV SOLN: Freq: Once | INTRAVENOUS | Status: AC

## 2023-06-06 MED ORDER — SODIUM CHLORIDE 0.9% FLUSH
10.0000 mL | Freq: Once | INTRAVENOUS | Status: AC
  Administered 2023-06-06: 10 mL via INTRAVENOUS

## 2023-06-06 MED ORDER — SODIUM CHLORIDE 0.9 % IV SOLN
400.0000 mg/m2 | Freq: Once | INTRAVENOUS | Status: AC
  Administered 2023-06-06: 780 mg via INTRAVENOUS
  Filled 2023-06-06: qty 39

## 2023-06-06 MED ORDER — HEPARIN SOD (PORK) LOCK FLUSH 100 UNIT/ML IV SOLN: 500.0000 [IU] | Freq: Once | INTRAVENOUS | Status: DC | PRN

## 2023-06-06 MED ORDER — PALONOSETRON HCL INJECTION 0.25 MG/5ML
0.2500 mg | Freq: Once | INTRAVENOUS | Status: AC
  Administered 2023-06-06: 0.25 mg via INTRAVENOUS
  Filled 2023-06-06: qty 5

## 2023-06-06 MED ORDER — FLUOROURACIL CHEMO INJECTION 2.5 GM/50ML
400.0000 mg/m2 | Freq: Once | INTRAVENOUS | Status: AC
  Administered 2023-06-06: 800 mg via INTRAVENOUS
  Filled 2023-06-06: qty 16

## 2023-06-06 MED ORDER — SODIUM CHLORIDE 0.9% FLUSH: 10.0000 mL | INTRAVENOUS | Status: DC | PRN

## 2023-06-06 NOTE — Progress Notes (Signed)
Patient presents today for Avastin/Leucovorin/Fluororuracil with 5FU pump start per providers order.  Vital signs and labs within parameters for treatment.    Treatment given today per MD orders.  Stable during infusion without adverse affects.  Vital signs stable.  5FU pump connected and verified RUN on the screen with the patient.  No complaints at this time.  Discharge from clinic ambulatory in stable condition.  Alert and oriented X 3.  Follow up with [facility] as scheduled.

## 2023-06-06 NOTE — Patient Instructions (Signed)
[facility]  Discharge Instructions: Thank you for choosing [facility] to provide your oncology and hematology care.  If you have a lab appointment with the Cancer Center - please note that after April 8th, 2024, all labs will be drawn in the cancer center.  You do not have to check in or register with the main entrance as you have in the past but will complete your check-in in the cancer center.  Wear comfortable clothing and clothing appropriate for easy access to any Portacath or PICC line.   We strive to give you quality time with your provider. You may need to reschedule your appointment if you arrive late (15 or more minutes).  Arriving late affects you and other patients whose appointments are after yours.  Also, if you miss three or more appointments without notifying the office, you may be dismissed from the clinic at the provider's discretion.      For prescription refill requests, have your pharmacy contact our office and allow 72 hours for refills to be completed.    Today you received the following chemotherapy and/or immunotherapy agents Avastin/Leucovorin/Fluororuracil/5FU      To help prevent nausea and vomiting after your treatment, we encourage you to take your nausea medication as directed.  BELOW ARE SYMPTOMS THAT SHOULD BE REPORTED IMMEDIATELY: *FEVER GREATER THAN 100.4 F (38 C) OR HIGHER *CHILLS OR SWEATING *NAUSEA AND VOMITING THAT IS NOT CONTROLLED WITH YOUR NAUSEA MEDICATION *UNUSUAL SHORTNESS OF BREATH *UNUSUAL BRUISING OR BLEEDING *URINARY PROBLEMS (pain or burning when urinating, or frequent urination) *BOWEL PROBLEMS (unusual diarrhea, constipation, pain near the anus) TENDERNESS IN MOUTH AND THROAT WITH OR WITHOUT PRESENCE OF ULCERS (sore throat, sores in mouth, or a toothache) UNUSUAL RASH, SWELLING OR PAIN  UNUSUAL VAGINAL DISCHARGE OR ITCHING   Items with * indicate a potential emergency and should be followed up as soon  as possible or go to the Emergency Department if any problems should occur.  Please show the CHEMOTHERAPY ALERT CARD or IMMUNOTHERAPY ALERT CARD at check-in to the Emergency Department and triage nurse.  Should you have questions after your visit or need to cancel or reschedule your appointment, please contact [facility] [phone]  and follow the prompts.  Office hours are 8:00 a.m. to 4:30 p.m. Monday - Friday. Please note that voicemails left after 4:00 p.m. may not be returned until the following business day.  We are closed weekends and major holidays. You have access to a nurse at all times for urgent questions. Please call the main number to the clinic [phone] and follow the prompts.  For any non-urgent questions, you may also contact your provider using MyChart. We now offer e-Visits for anyone 74 and older to request care online for non-urgent symptoms. For details visit [email or web address].   Also download the MyChart app! Go to the app store, search "MyChart", open the app, select , and log in with your MyChart username and password.

## 2023-06-08 ENCOUNTER — Inpatient Hospital Stay: Payer: Medicare Other

## 2023-06-08 VITALS — BP 115/80 | HR 79 | Temp 97.2°F | Resp 18

## 2023-06-08 DIAGNOSIS — I10 Essential (primary) hypertension: Secondary | ICD-10-CM | POA: Diagnosis not present

## 2023-06-08 DIAGNOSIS — C7802 Secondary malignant neoplasm of left lung: Secondary | ICD-10-CM | POA: Diagnosis not present

## 2023-06-08 DIAGNOSIS — Z95828 Presence of other vascular implants and grafts: Secondary | ICD-10-CM

## 2023-06-08 DIAGNOSIS — C180 Malignant neoplasm of cecum: Secondary | ICD-10-CM

## 2023-06-08 DIAGNOSIS — Z79899 Other long term (current) drug therapy: Secondary | ICD-10-CM | POA: Diagnosis not present

## 2023-06-08 DIAGNOSIS — Z87891 Personal history of nicotine dependence: Secondary | ICD-10-CM | POA: Diagnosis not present

## 2023-06-08 DIAGNOSIS — C7972 Secondary malignant neoplasm of left adrenal gland: Secondary | ICD-10-CM | POA: Diagnosis not present

## 2023-06-08 DIAGNOSIS — Z5111 Encounter for antineoplastic chemotherapy: Secondary | ICD-10-CM | POA: Diagnosis not present

## 2023-06-08 DIAGNOSIS — Z5112 Encounter for antineoplastic immunotherapy: Secondary | ICD-10-CM | POA: Diagnosis not present

## 2023-06-08 DIAGNOSIS — C7801 Secondary malignant neoplasm of right lung: Secondary | ICD-10-CM | POA: Diagnosis not present

## 2023-06-08 MED ORDER — SODIUM CHLORIDE 0.9% FLUSH
10.0000 mL | INTRAVENOUS | Status: DC | PRN
  Administered 2023-06-08: 10 mL

## 2023-06-08 MED ORDER — HEPARIN SOD (PORK) LOCK FLUSH 100 UNIT/ML IV SOLN
500.0000 [IU] | Freq: Once | INTRAVENOUS | Status: AC | PRN
  Administered 2023-06-08: 500 [IU]

## 2023-06-08 NOTE — Progress Notes (Signed)
Patient presents today for pump d/c, Port flushed with good blood return noted. No bruising or swelling at site. Bandaid applied and patient discharged in satisfactory condition. VVS stable with no signs or symptoms of distressed noted.

## 2023-06-08 NOTE — Patient Instructions (Signed)
[facility]  Discharge Instructions: Thank you for choosing [facility] to provide your oncology and hematology care.  If you have a lab appointment with the Cancer Center - please note that after April 8th, 2024, all labs will be drawn in the cancer center.  You do not have to check in or register with the main entrance as you have in the past but will complete your check-in in the cancer center.  Wear comfortable clothing and clothing appropriate for easy access to any Portacath or PICC line.   We strive to give you quality time with your provider. You may need to reschedule your appointment if you arrive late (15 or more minutes).  Arriving late affects you and other patients whose appointments are after yours.  Also, if you miss three or more appointments without notifying the office, you may be dismissed from the clinic at the provider's discretion.      For prescription refill requests, have your pharmacy contact our office and allow 72 hours for refills to be completed.    Today you received the following Pump d/c, return as scheduled.   To help prevent nausea and vomiting after your treatment, we encourage you to take your nausea medication as directed.  BELOW ARE SYMPTOMS THAT SHOULD BE REPORTED IMMEDIATELY: *FEVER GREATER THAN 100.4 F (38 C) OR HIGHER *CHILLS OR SWEATING *NAUSEA AND VOMITING THAT IS NOT CONTROLLED WITH YOUR NAUSEA MEDICATION *UNUSUAL SHORTNESS OF BREATH *UNUSUAL BRUISING OR BLEEDING *URINARY PROBLEMS (pain or burning when urinating, or frequent urination) *BOWEL PROBLEMS (unusual diarrhea, constipation, pain near the anus) TENDERNESS IN MOUTH AND THROAT WITH OR WITHOUT PRESENCE OF ULCERS (sore throat, sores in mouth, or a toothache) UNUSUAL RASH, SWELLING OR PAIN  UNUSUAL VAGINAL DISCHARGE OR ITCHING   Items with * indicate a potential emergency and should be followed up as soon as possible or go to the Emergency Department if  any problems should occur.  Please show the CHEMOTHERAPY ALERT CARD or IMMUNOTHERAPY ALERT CARD at check-in to the Emergency Department and triage nurse.  Should you have questions after your visit or need to cancel or reschedule your appointment, please contact [facility] [phone]  and follow the prompts.  Office hours are 8:00 a.m. to 4:30 p.m. Monday - Friday. Please note that voicemails left after 4:00 p.m. may not be returned until the following business day.  We are closed weekends and major holidays. You have access to a nurse at all times for urgent questions. Please call the main number to the clinic [phone] and follow the prompts.  For any non-urgent questions, you may also contact your provider using MyChart. We now offer e-Visits for anyone 66 and older to request care online for non-urgent symptoms. For details visit [email or web address].   Also download the MyChart app! Go to the app store, search "MyChart", open the app, select [place], and log in with your MyChart username and password.

## 2023-06-15 ENCOUNTER — Ambulatory Visit (HOSPITAL_COMMUNITY): Admission: RE | Admit: 2023-06-15 | Payer: Medicare Other | Source: Ambulatory Visit

## 2023-06-20 ENCOUNTER — Inpatient Hospital Stay: Payer: Medicare Other

## 2023-06-20 ENCOUNTER — Inpatient Hospital Stay: Payer: Medicare Other | Admitting: Hematology

## 2023-06-20 ENCOUNTER — Inpatient Hospital Stay: Payer: Medicare Other | Attending: Hematology

## 2023-06-20 ENCOUNTER — Other Ambulatory Visit: Payer: Self-pay

## 2023-06-20 VITALS — BP 117/85 | HR 54 | Temp 96.1°F | Resp 18

## 2023-06-20 DIAGNOSIS — Z95828 Presence of other vascular implants and grafts: Secondary | ICD-10-CM

## 2023-06-20 DIAGNOSIS — Z5112 Encounter for antineoplastic immunotherapy: Secondary | ICD-10-CM | POA: Diagnosis not present

## 2023-06-20 DIAGNOSIS — C7801 Secondary malignant neoplasm of right lung: Secondary | ICD-10-CM | POA: Diagnosis not present

## 2023-06-20 DIAGNOSIS — Z5111 Encounter for antineoplastic chemotherapy: Secondary | ICD-10-CM | POA: Diagnosis not present

## 2023-06-20 DIAGNOSIS — Z79899 Other long term (current) drug therapy: Secondary | ICD-10-CM | POA: Diagnosis not present

## 2023-06-20 DIAGNOSIS — C7972 Secondary malignant neoplasm of left adrenal gland: Secondary | ICD-10-CM | POA: Diagnosis not present

## 2023-06-20 DIAGNOSIS — C7802 Secondary malignant neoplasm of left lung: Secondary | ICD-10-CM | POA: Insufficient documentation

## 2023-06-20 DIAGNOSIS — I10 Essential (primary) hypertension: Secondary | ICD-10-CM | POA: Diagnosis not present

## 2023-06-20 DIAGNOSIS — C180 Malignant neoplasm of cecum: Secondary | ICD-10-CM | POA: Insufficient documentation

## 2023-06-20 DIAGNOSIS — Z87891 Personal history of nicotine dependence: Secondary | ICD-10-CM | POA: Diagnosis not present

## 2023-06-20 DIAGNOSIS — G893 Neoplasm related pain (acute) (chronic): Secondary | ICD-10-CM | POA: Diagnosis not present

## 2023-06-20 DIAGNOSIS — C189 Malignant neoplasm of colon, unspecified: Secondary | ICD-10-CM | POA: Diagnosis not present

## 2023-06-20 LAB — CBC WITH DIFFERENTIAL/PLATELET
Abs Immature Granulocytes: 0.02 10*3/uL (ref 0.00–0.07)
Basophils Absolute: 0.1 10*3/uL (ref 0.0–0.1)
Basophils Relative: 1 %
Eosinophils Absolute: 0.2 10*3/uL (ref 0.0–0.5)
Eosinophils Relative: 4 %
HCT: 36.6 % — ABNORMAL LOW (ref 39.0–52.0)
Hemoglobin: 12 g/dL — ABNORMAL LOW (ref 13.0–17.0)
Immature Granulocytes: 0 %
Lymphocytes Relative: 43 %
Lymphs Abs: 2.5 10*3/uL (ref 0.7–4.0)
MCH: 31.3 pg (ref 26.0–34.0)
MCHC: 32.8 g/dL (ref 30.0–36.0)
MCV: 95.6 fL (ref 80.0–100.0)
Monocytes Absolute: 0.8 10*3/uL (ref 0.1–1.0)
Monocytes Relative: 14 %
Neutro Abs: 2.2 10*3/uL (ref 1.7–7.7)
Neutrophils Relative %: 38 %
Platelets: 121 10*3/uL — ABNORMAL LOW (ref 150–400)
RBC: 3.83 MIL/uL — ABNORMAL LOW (ref 4.22–5.81)
RDW: 15 % (ref 11.5–15.5)
WBC: 5.7 10*3/uL (ref 4.0–10.5)
nRBC: 0 % (ref 0.0–0.2)

## 2023-06-20 LAB — COMPREHENSIVE METABOLIC PANEL
ALT: 9 U/L (ref 0–44)
AST: 23 U/L (ref 15–41)
Albumin: 3.9 g/dL (ref 3.5–5.0)
Alkaline Phosphatase: 38 U/L (ref 38–126)
Anion gap: 8 (ref 5–15)
BUN: 11 mg/dL (ref 8–23)
CO2: 24 mmol/L (ref 22–32)
Calcium: 9 mg/dL (ref 8.9–10.3)
Chloride: 101 mmol/L (ref 98–111)
Creatinine, Ser: 1.18 mg/dL (ref 0.61–1.24)
GFR, Estimated: >60 mL/min (ref >60)
Glucose, Bld: 106 mg/dL — ABNORMAL HIGH (ref 70–99)
Potassium: 3.8 mmol/L (ref 3.5–5.1)
Sodium: 133 mmol/L — ABNORMAL LOW (ref 135–145)
Total Bilirubin: 1.7 mg/dL — ABNORMAL HIGH (ref 0.3–1.2)
Total Protein: 7.5 g/dL (ref 6.5–8.1)

## 2023-06-20 LAB — URINALYSIS, DIPSTICK ONLY
Bilirubin Urine: NEGATIVE
Glucose, UA: NEGATIVE mg/dL
Hgb urine dipstick: NEGATIVE
Ketones, ur: NEGATIVE mg/dL
Leukocytes,Ua: NEGATIVE
Nitrite: NEGATIVE
Protein, ur: NEGATIVE mg/dL
Specific Gravity, Urine: 1.008 (ref 1.005–1.030)
pH: 5 (ref 5.0–8.0)

## 2023-06-20 LAB — MAGNESIUM: Magnesium: 2 mg/dL (ref 1.7–2.4)

## 2023-06-20 MED ORDER — SODIUM CHLORIDE 0.9 % IV SOLN
400.0000 mg/m2 | Freq: Once | INTRAVENOUS | Status: AC
  Administered 2023-06-20: 780 mg via INTRAVENOUS
  Filled 2023-06-20: qty 39

## 2023-06-20 MED ORDER — FLUOROURACIL CHEMO INJECTION 2.5 GM/50ML
400.0000 mg/m2 | Freq: Once | INTRAVENOUS | Status: AC
  Administered 2023-06-20: 800 mg via INTRAVENOUS
  Filled 2023-06-20: qty 16

## 2023-06-20 MED ORDER — SODIUM CHLORIDE 0.9% FLUSH: 10.0000 mL | Freq: Once | INTRAVENOUS | Status: DC

## 2023-06-20 MED ORDER — SODIUM CHLORIDE 0.9% FLUSH
10.0000 mL | Freq: Once | INTRAVENOUS | Status: AC
  Administered 2023-06-20: 10 mL via INTRAVENOUS

## 2023-06-20 MED ORDER — SODIUM CHLORIDE 0.9 % IV SOLN
5.0000 mg/kg | Freq: Once | INTRAVENOUS | Status: AC
  Administered 2023-06-20: 400 mg via INTRAVENOUS
  Filled 2023-06-20: qty 16

## 2023-06-20 MED ORDER — SODIUM CHLORIDE 0.9 % IV SOLN
2400.0000 mg/m2 | INTRAVENOUS | Status: DC
  Administered 2023-06-20: 5000 mg via INTRAVENOUS
  Filled 2023-06-20: qty 100

## 2023-06-20 MED ORDER — PALONOSETRON HCL INJECTION 0.25 MG/5ML
0.2500 mg | Freq: Once | INTRAVENOUS | Status: AC
  Administered 2023-06-20: 0.25 mg via INTRAVENOUS
  Filled 2023-06-20: qty 5

## 2023-06-20 MED ORDER — SODIUM CHLORIDE 0.9 % IV SOLN: Freq: Once | INTRAVENOUS | Status: AC

## 2023-06-20 MED ORDER — SODIUM CHLORIDE 0.9 % IV SOLN
10.0000 mg | Freq: Once | INTRAVENOUS | Status: AC
  Administered 2023-06-20: 10 mg via INTRAVENOUS
  Filled 2023-06-20: qty 10

## 2023-06-20 NOTE — Patient Instructions (Signed)
[facility]  Discharge Instructions: Thank you for choosing [facility] to provide your oncology and hematology care.  If you have a lab appointment with the Cancer Center - please note that after April 8th, 2024, all labs will be drawn in the cancer center.  You do not have to check in or register with the main entrance as you have in the past but will complete your check-in in the cancer center.  Wear comfortable clothing and clothing appropriate for easy access to any Portacath or PICC line.   We strive to give you quality time with your provider. You may need to reschedule your appointment if you arrive late (15 or more minutes).  Arriving late affects you and other patients whose appointments are after yours.  Also, if you miss three or more appointments without notifying the office, you may be dismissed from the clinic at the provider's discretion.      For prescription refill requests, have your pharmacy contact our office and allow 72 hours for refills to be completed.    Today you received the following chemotherapy and/or immunotherapy agents MVASI, Leucovorin, and 5 FU pump. Fluorouracil Injection What is this medication? FLUOROURACIL (flure oh YOOR a sil) treats some types of cancer. It works by slowing down the growth of cancer cells. This medicine may be used for other purposes; ask your health care provider or pharmacist if you have questions. COMMON BRAND NAME(S): Adrucil What should I tell my care team before I take this medication? They need to know if you have any of these conditions: Blood disorders Dihydropyrimidine dehydrogenase (DPD) deficiency Infection, such as chickenpox, cold sores, herpes Kidney disease Liver disease Poor nutrition Recent or ongoing radiation therapy An unusual or allergic reaction to fluorouracil, other medications, foods, dyes, or preservatives If you or your partner are pregnant or trying to get  pregnant Breast-feeding How should I use this medication? This medication is injected into a vein. It is administered by your care team in a hospital or clinic setting. Talk to your care team about the use of this medication in children. Special care may be needed. Overdosage: If you think you have taken too much of this medicine contact a poison control center or emergency room at once. NOTE: This medicine is only for you. Do not share this medicine with others. What if I miss a dose? Keep appointments for follow-up doses. It is important not to miss your dose. Call your care team if you are unable to keep an appointment. What may interact with this medication? Do not take this medication with any of the following: Live virus vaccines This medication may also interact with the following: Medications that treat or prevent blood clots, such as warfarin, enoxaparin, dalteparin This list may not describe all possible interactions. Give your health care provider a list of all the medicines, herbs, non-prescription drugs, or dietary supplements you use. Also tell them if you smoke, drink alcohol, or use illegal drugs. Some items may interact with your medicine. What should I watch for while using this medication? Your condition will be monitored carefully while you are receiving this medication. This medication may make you feel generally unwell. This is not uncommon as chemotherapy can affect healthy cells as well as cancer cells. Report any side effects. Continue your course of treatment even though you feel ill unless your care team tells you to stop. In some cases, you may be given additional medications to help with  side effects. Follow all directions for their use. This medication may increase your risk of getting an infection. Call your care team for advice if you get a fever, chills, sore throat, or other symptoms of a cold or flu. Do not treat yourself. Try to avoid being around people who are  sick. This medication may increase your risk to bruise or bleed. Call your care team if you notice any unusual bleeding. Be careful brushing or flossing your teeth or using a toothpick because you may get an infection or bleed more easily. If you have any dental work done, tell your dentist you are receiving this medication. Avoid taking medications that contain aspirin, acetaminophen, ibuprofen, naproxen, or ketoprofen unless instructed by your care team. These medications may hide a fever. Do not treat diarrhea with over the counter products. Contact your care team if you have diarrhea that lasts more than 2 days or if it is severe and watery. This medication can make you more sensitive to the sun. Keep out of the sun. If you cannot avoid being in the sun, wear protective clothing and sunscreen. Do not use sun lamps, tanning beds, or tanning booths. Talk to your care team if you or your partner wish to become pregnant or think you might be pregnant. This medication can cause serious birth defects if taken during pregnancy and for 3 months after the last dose. A reliable form of contraception is recommended while taking this medication and for 3 months after the last dose. Talk to your care team about effective forms of contraception. Do not father a child while taking this medication and for 3 months after the last dose. Use a condom while having sex during this time period. Do not breastfeed while taking this medication. This medication may cause infertility. Talk to your care team if you are concerned about your fertility. What side effects may I notice from receiving this medication? Side effects that you should report to your care team as soon as possible: Allergic reactions--skin rash, itching, hives, swelling of the face, lips, tongue, or throat Heart attack--pain or tightness in the chest, shoulders, arms, or jaw, nausea, shortness of breath, cold or clammy skin, feeling faint or  lightheaded Heart failure--shortness of breath, swelling of the ankles, feet, or hands, sudden weight gain, unusual weakness or fatigue Heart rhythm changes--fast or irregular heartbeat, dizziness, feeling faint or lightheaded, chest pain, trouble breathing High ammonia level--unusual weakness or fatigue, confusion, loss of appetite, nausea, vomiting, seizures Infection--fever, chills, cough, sore throat, wounds that don't heal, pain or trouble when passing urine, general feeling of discomfort or being unwell Low red blood cell level--unusual weakness or fatigue, dizziness, headache, trouble breathing Pain, tingling, or numbness in the hands or feet, muscle weakness, change in vision, confusion or trouble speaking, loss of balance or coordination, trouble walking, seizures Redness, swelling, and blistering of the skin over hands and feet Severe or prolonged diarrhea Unusual bruising or bleeding Side effects that usually do not require medical attention (report to your care team if they continue or are bothersome): Dry skin Headache Increased tears Nausea Pain, redness, or swelling with sores inside the mouth or throat Sensitivity to light Vomiting This list may not describe all possible side effects. Call your doctor for medical advice about side effects. You may report side effects to FDA at 1-800-FDA-1088. Where should I keep my medication? This medication is given in a hospital or clinic. It will not be stored at home. NOTE: This sheet is  a summary. It may not cover all possible information. If you have questions about this medicine, talk to your doctor, pharmacist, or health care provider.  2024 Elsevier/Gold Standard (2022-04-04 00:00:00) Leucovorin Injection What is this medication? LEUCOVORIN (loo koe VOR in) prevents side effects from certain medications, such as methotrexate. It works by increasing folate levels. This helps protect healthy cells in your body. It may also be used to  treat anemia caused by low levels of folate. It can also be used with fluorouracil, a type of chemotherapy, to treat colorectal cancer. It works by increasing the effects of fluorouracil in the body. This medicine may be used for other purposes; ask your health care provider or pharmacist if you have questions. What should I tell my care team before I take this medication? They need to know if you have any of these conditions: Anemia from low levels of vitamin B12 in the blood An unusual or allergic reaction to leucovorin, folic acid, other medications, foods, dyes, or preservatives Pregnant or trying to get pregnant Breastfeeding How should I use this medication? This medication is injected into a vein or a muscle. It is given by your care team in a hospital or clinic setting. Talk to your care team about the use of this medication in children. Special care may be needed. Overdosage: If you think you have taken too much of this medicine contact a poison control center or emergency room at once. NOTE: This medicine is only for you. Do not share this medicine with others. What if I miss a dose? Keep appointments for follow-up doses. It is important not to miss your dose. Call your care team if you are unable to keep an appointment. What may interact with this medication? Capecitabine Fluorouracil Phenobarbital Phenytoin Primidone Trimethoprim;sulfamethoxazole This list may not describe all possible interactions. Give your health care provider a list of all the medicines, herbs, non-prescription drugs, or dietary supplements you use. Also tell them if you smoke, drink alcohol, or use illegal drugs. Some items may interact with your medicine. What should I watch for while using this medication? Your condition will be monitored carefully while you are receiving this medication. This medication may increase the side effects of 5-fluorouracil. Tell your care team if you have diarrhea or mouth  sores that do not get better or that get worse. What side effects may I notice from receiving this medication? Side effects that you should report to your care team as soon as possible: Allergic reactions--skin rash, itching, hives, swelling of the face, lips, tongue, or throat This list may not describe all possible side effects. Call your doctor for medical advice about side effects. You may report side effects to FDA at 1-800-FDA-1088. Where should I keep my medication? This medication is given in a hospital or clinic. It will not be stored at home. NOTE: This sheet is a summary. It may not cover all possible information. If you have questions about this medicine, talk to your doctor, pharmacist, or health care provider.  2024 Elsevier/Gold Standard (2022-05-02 00:00:00) Bevacizumab Injection What is this medication? BEVACIZUMAB (be va SIZ yoo mab) treats some types of cancer. It works by blocking a protein that causes cancer cells to grow and multiply. This helps to slow or stop the spread of cancer cells. It is a monoclonal antibody. This medicine may be used for other purposes; ask your health care provider or pharmacist if you have questions. COMMON BRAND NAME(S): Alymsys, Avastin, MVASI, Zirabev What should I  tell my care team before I take this medication? They need to know if you have any of these conditions: Blood clots Coughing up blood Having or recent surgery Heart failure High blood pressure History of a connection between 2 or more body parts that do not usually connect (fistula) History of a tear in your stomach or intestines Protein in your urine An unusual or allergic reaction to bevacizumab, other medications, foods, dyes, or preservatives Pregnant or trying to get pregnant Breast-feeding How should I use this medication? This medication is injected into a vein. It is given by your care team in a hospital or clinic setting. Talk to your care team the use of this  medication in children. Special care may be needed. Overdosage: If you think you have taken too much of this medicine contact a poison control center or emergency room at once. NOTE: This medicine is only for you. Do not share this medicine with others. What if I miss a dose? Keep appointments for follow-up doses. It is important not to miss your dose. Call your care team if you are unable to keep an appointment. What may interact with this medication? Interactions are not expected. This list may not describe all possible interactions. Give your health care provider a list of all the medicines, herbs, non-prescription drugs, or dietary supplements you use. Also tell them if you smoke, drink alcohol, or use illegal drugs. Some items may interact with your medicine. What should I watch for while using this medication? Your condition will be monitored carefully while you are receiving this medication. You may need blood work while taking this medication. This medication may make you feel generally unwell. This is not uncommon as chemotherapy can affect healthy cells as well as cancer cells. Report any side effects. Continue your course of treatment even though you feel ill unless your care team tells you to stop. This medication may increase your risk to bruise or bleed. Call your care team if you notice any unusual bleeding. Before having surgery, talk to your care team to make sure it is ok. This medication can increase the risk of poor healing of your surgical site or wound. You will need to stop this medication for 28 days before surgery. After surgery, wait at least 28 days before restarting this medication. Make sure the surgical site or wound is healed enough before restarting this medication. Talk to your care team if questions. Talk to your care team if you may be pregnant. Serious birth defects can occur if you take this medication during pregnancy and for 6 months after the last dose.  Contraception is recommended while taking this medication and for 6 months after the last dose. Your care team can help you find the option that works for you. Do not breastfeed while taking this medication and for 6 months after the last dose. This medication can cause infertility. Talk to your care team if you are concerned about your fertility. What side effects may I notice from receiving this medication? Side effects that you should report to your care team as soon as possible: Allergic reactions--skin rash, itching, hives, swelling of the face, lips, tongue, or throat Bleeding--bloody or black, tar-like stools, vomiting blood or brown material that looks like coffee grounds, red or dark brown urine, small red or purple spots on skin, unusual bruising or bleeding Blood clot--pain, swelling, or warmth in the leg, shortness of breath, chest pain Heart attack--pain or tightness in the chest, shoulders, arms, or  jaw, nausea, shortness of breath, cold or clammy skin, feeling faint or lightheaded Heart failure--shortness of breath, swelling of the ankles, feet, or hands, sudden weight gain, unusual weakness or fatigue Increase in blood pressure Infection--fever, chills, cough, sore throat, wounds that don't heal, pain or trouble when passing urine, general feeling of discomfort or being unwell Infusion reactions--chest pain, shortness of breath or trouble breathing, feeling faint or lightheaded Kidney injury--decrease in the amount of urine, swelling of the ankles, hands, or feet Stomach pain that is severe, does not go away, or gets worse Stroke--sudden numbness or weakness of the face, arm, or leg, trouble speaking, confusion, trouble walking, loss of balance or coordination, dizziness, severe headache, change in vision Sudden and severe headache, confusion, change in vision, seizures, which may be signs of posterior reversible encephalopathy syndrome (PRES) Side effects that usually do not require  medical attention (report to your care team if they continue or are bothersome): Back pain Change in taste Diarrhea Dry skin Increased tears Nosebleed This list may not describe all possible side effects. Call your doctor for medical advice about side effects. You may report side effects to FDA at 1-800-FDA-1088. Where should I keep my medication? This medication is given in a hospital or clinic. It will not be stored at home. NOTE: This sheet is a summary. It may not cover all possible information. If you have questions about this medicine, talk to your doctor, pharmacist, or health care provider.  2024 Elsevier/Gold Standard (2022-04-14 00:00:00)       To help prevent nausea and vomiting after your treatment, we encourage you to take your nausea medication as directed.  BELOW ARE SYMPTOMS THAT SHOULD BE REPORTED IMMEDIATELY: *FEVER GREATER THAN 100.4 F (38 C) OR HIGHER *CHILLS OR SWEATING *NAUSEA AND VOMITING THAT IS NOT CONTROLLED WITH YOUR NAUSEA MEDICATION *UNUSUAL SHORTNESS OF BREATH *UNUSUAL BRUISING OR BLEEDING *URINARY PROBLEMS (pain or burning when urinating, or frequent urination) *BOWEL PROBLEMS (unusual diarrhea, constipation, pain near the anus) TENDERNESS IN MOUTH AND THROAT WITH OR WITHOUT PRESENCE OF ULCERS (sore throat, sores in mouth, or a toothache) UNUSUAL RASH, SWELLING OR PAIN  UNUSUAL VAGINAL DISCHARGE OR ITCHING   Items with * indicate a potential emergency and should be followed up as soon as possible or go to the Emergency Department if any problems should occur.  Please show the CHEMOTHERAPY ALERT CARD or IMMUNOTHERAPY ALERT CARD at check-in to the Emergency Department and triage nurse.  Should you have questions after your visit or need to cancel or reschedule your appointment, please contact [facility] [phone]  and follow the prompts.  Office hours are 8:00 a.m. to 4:30 p.m. Monday - Friday. Please note that voicemails left  after 4:00 p.m. may not be returned until the following business day.  We are closed weekends and major holidays. You have access to a nurse at all times for urgent questions. Please call the main number to the clinic [phone] and follow the prompts.  For any non-urgent questions, you may also contact your provider using MyChart. We now offer e-Visits for anyone 76 and older to request care online for non-urgent symptoms. For details visit [email or web address].   Also download the MyChart app! Go to the app store, search "MyChart", open the app, select [facility], and log in with your MyChart username and password.

## 2023-06-20 NOTE — Progress Notes (Signed)
Patient presents today for MVASI, Leucovorin, 5FU pump. Vital signs within parameters for treatment. Labs pending.   Labs within parameters for treatment. Patient has complaints of his index finger and thumb on his right hand only peeling and soreness noted. Message sent to Dr. Ellin Saba.   Patient teaching performed pertaining to Dr. Marice Potter order to get OTC Hydrocortisone cream and place on fingers BID. Understanding verbalized. Schedule printed off for patient and upcoming appointments discussed with patient. Understanding verbalized.   Treatment given today per MD orders. Tolerated infusion without adverse affects. Vital signs stable. No complaints at this time. 5FU pump infusing and RUN noted on screen and verified with patient. Discharged from clinic ambulatory in stable condition. Alert and oriented x 3. F/U with [facility] as scheduled.

## 2023-06-21 ENCOUNTER — Encounter: Payer: Self-pay | Admitting: Hematology

## 2023-06-22 ENCOUNTER — Inpatient Hospital Stay: Payer: Medicare Other

## 2023-06-22 ENCOUNTER — Encounter (HOSPITAL_COMMUNITY): Payer: Self-pay | Admitting: Hematology

## 2023-06-22 ENCOUNTER — Encounter: Payer: Self-pay | Admitting: Hematology

## 2023-06-22 VITALS — BP 135/80 | HR 58 | Temp 97.0°F | Resp 16

## 2023-06-22 DIAGNOSIS — C180 Malignant neoplasm of cecum: Secondary | ICD-10-CM | POA: Diagnosis not present

## 2023-06-22 DIAGNOSIS — C7972 Secondary malignant neoplasm of left adrenal gland: Secondary | ICD-10-CM | POA: Diagnosis not present

## 2023-06-22 DIAGNOSIS — C7801 Secondary malignant neoplasm of right lung: Secondary | ICD-10-CM | POA: Diagnosis not present

## 2023-06-22 DIAGNOSIS — I10 Essential (primary) hypertension: Secondary | ICD-10-CM | POA: Diagnosis not present

## 2023-06-22 DIAGNOSIS — Z79899 Other long term (current) drug therapy: Secondary | ICD-10-CM | POA: Diagnosis not present

## 2023-06-22 DIAGNOSIS — Z95828 Presence of other vascular implants and grafts: Secondary | ICD-10-CM

## 2023-06-22 DIAGNOSIS — C7802 Secondary malignant neoplasm of left lung: Secondary | ICD-10-CM | POA: Diagnosis not present

## 2023-06-22 DIAGNOSIS — G893 Neoplasm related pain (acute) (chronic): Secondary | ICD-10-CM | POA: Diagnosis not present

## 2023-06-22 DIAGNOSIS — Z87891 Personal history of nicotine dependence: Secondary | ICD-10-CM | POA: Diagnosis not present

## 2023-06-22 DIAGNOSIS — Z5112 Encounter for antineoplastic immunotherapy: Secondary | ICD-10-CM | POA: Diagnosis not present

## 2023-06-22 DIAGNOSIS — Z5111 Encounter for antineoplastic chemotherapy: Secondary | ICD-10-CM | POA: Diagnosis not present

## 2023-06-22 MED ORDER — HEPARIN SOD (PORK) LOCK FLUSH 100 UNIT/ML IV SOLN
500.0000 [IU] | Freq: Once | INTRAVENOUS | Status: AC | PRN
  Administered 2023-06-22: 500 [IU]

## 2023-06-22 MED ORDER — SODIUM CHLORIDE 0.9% FLUSH
10.0000 mL | INTRAVENOUS | Status: DC | PRN
  Administered 2023-06-22: 10 mL

## 2023-06-22 NOTE — Patient Instructions (Signed)
[facility] [place]  Discharge Instructions: Thank you for choosing [facility] to provide your oncology and hematology care.  If you have a lab appointment with the Cancer Center - please note that after April 8th, 2024, all labs will be drawn in the cancer center.  You do not have to check in or register with the main entrance as you have in the past but will complete your check-in in the cancer center.  Wear comfortable clothing and clothing appropriate for easy access to any Portacath or PICC line.   We strive to give you quality time with your provider. You may need to reschedule your appointment if you arrive late (15 or more minutes).  Arriving late affects you and other patients whose appointments are after yours.  Also, if you miss three or more appointments without notifying the office, you may be dismissed from the clinic at the provider's discretion.      For prescription refill requests, have your pharmacy contact our office and allow 72 hours for refills to be completed.    Today you received the following 5FU pump disconnect.   To help prevent nausea and vomiting after your treatment, we encourage you to take your nausea medication as directed.  BELOW ARE SYMPTOMS THAT SHOULD BE REPORTED IMMEDIATELY: *FEVER GREATER THAN 100.4 F (38 C) OR HIGHER *CHILLS OR SWEATING *NAUSEA AND VOMITING THAT IS NOT CONTROLLED WITH YOUR NAUSEA MEDICATION *UNUSUAL SHORTNESS OF BREATH *UNUSUAL BRUISING OR BLEEDING *URINARY PROBLEMS (pain or burning when urinating, or frequent urination) *BOWEL PROBLEMS (unusual diarrhea, constipation, pain near the anus) TENDERNESS IN MOUTH AND THROAT WITH OR WITHOUT PRESENCE OF ULCERS (sore throat, sores in mouth, or a toothache) UNUSUAL RASH, SWELLING OR PAIN  UNUSUAL VAGINAL DISCHARGE OR ITCHING   Items with * indicate a potential emergency and should be followed up as soon as possible or go to the Emergency Department if any  problems should occur.  Please show the CHEMOTHERAPY ALERT CARD or IMMUNOTHERAPY ALERT CARD at check-in to the Emergency Department and triage nurse.  Should you have questions after your visit or need to cancel or reschedule your appointment, please contact [facility] [place] [phone]  and follow the prompts.  Office hours are 8:00 a.m. to 4:30 p.m. Monday - Friday. Please note that voicemails left after 4:00 p.m. may not be returned until the following business day.  We are closed weekends and major holidays. You have access to a nurse at all times for urgent questions. Please call the main number to the clinic [phone] and follow the prompts.  For any non-urgent questions, you may also contact your provider using MyChart. We now offer e-Visits for anyone 18 and older to request care online for non-urgent symptoms. For details visit [email or web address].   Also download the MyChart app! Go to the app store, search "MyChart", open the app, select [facility], and log in with your MyChart username and password.   

## 2023-06-22 NOTE — Progress Notes (Signed)
Patients here to have 5FU pump removed. Patient's port flushed without difficulty.  Good blood return noted with no bruising or swelling noted at site.  Band aid applied.  VSS with discharge and left in satisfactory condition with no s/s of distress noted.

## 2023-06-25 DIAGNOSIS — C189 Malignant neoplasm of colon, unspecified: Secondary | ICD-10-CM | POA: Diagnosis not present

## 2023-06-27 ENCOUNTER — Ambulatory Visit (HOSPITAL_COMMUNITY): Admission: RE | Admit: 2023-06-27 | Payer: Medicare Other | Source: Ambulatory Visit

## 2023-06-28 ENCOUNTER — Other Ambulatory Visit: Payer: Self-pay

## 2023-06-28 DIAGNOSIS — C189 Malignant neoplasm of colon, unspecified: Secondary | ICD-10-CM

## 2023-07-03 NOTE — Progress Notes (Signed)
[facility] [address], [place] [address]    Clinic Day:  07/03/2023  Referring physician: [name], PA  Patient Care Team: [name], [name] as PCP - General (Physician Assistant) [name], MD as Medical Oncologist (Medical Oncology) Therese Sarah, RN as Oncology Nurse Navigator (Medical Oncology)   ASSESSMENT & PLAN:   Assessment: 1. Left lung mass with multiple lung nodules: - Presentation with dry cough for 6 months. - 30 pound weight loss in the last couple of years, weight stable over the last 6 months. - CT chest with contrast on 12/16/2021 showed bulky left hilar mass measuring 8.1 x 7.5 cm.  Numerous bilateral lung nodules of varying sizes.  Left adrenal nodule measuring 2.8 x 2.2 cm.  Mediastinal and bilateral hilar adenopathy with the largest pretracheal node measuring 4.1 x 3.1 cm. - MRI of the brain from 01/04/2022 which was negative for metastatic disease. - CTAP from 01/03/2022 which showed isolated left adrenal metastasis with no other evidence of metastatic disease in the abdomen or pelvis. - Pathology of left lung biopsy which shows adenocarcinoma with necrosis.  CK20 positive and CDX2 positive but negative for CK7 indicating colonic primary. 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  HER2 negative.  TMB low.  MSI-stable.  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  Other targetable mutations negative. - FOLFIRI started on 02/22/2022, bevacizumab added with cycle 4  - CT CAP (05/17/2022): Mediastinal and hilar lymph nodes have decreased in size.  Largest perihilar left lower lobe lung mass has decreased in size.  Right upper lobe mass slightly increased in size.  Other nodules are stable.  Left adrenal mass decreased to 1.3 cm from 2.8 cm. - CT scan showed mixed response as it was compared to CT from 12/16/2021.  He did not start chemotherapy until 02/22/2022. - Maintenance 5-FU and bevacizumab from September 2023.   2. Social/family  history: - Lives by himself.  He paints houses for living. - He quit smoking 12 years back and started back again 1 and half year ago and smoked half pack per day.  He quit again about 1 week ago. - Father had cancer, type unknown to the patient.  Brother died of brain tumor.  3.  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  Margins negative.  No lymphovascular or perineural invasion. - Received 3 cycles of XELOX followed by Xeloda for total of 6 months.  Oxaliplatin discontinued during cycle 4 secondary to transaminitis, elevated bilirubin and thrombocytopenia.  However he was also treated for hep C with Harvoni after that.    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 - Last CEA improved to 64. - Labs today: Normal CBC with platelet count 140.  LFTs are normal.  UA was negative for protein. - Proceed with maintenance treatment every 2 weeks. - Recommend follow-up after CT scan of CAP.  2.  Lower rib/epigastric pain: - Continue oxycodone 10 mg every 4 hours as needed.  Pain is well-controlled.  3.  Difficulty falling asleep: - He is not requiring trazodone on regular basis.  Continue as needed.  4.  Hypertension: - Continue amlodipine 10 mg daily.  Blood pressure today is well-controlled.    No orders of the defined types were placed in this  encounter.     Alben Deeds Teague,acting as a Neurosurgeon for [name], MD.,have documented all relevant documentation on the behalf of [name], MD,as directed by  [name], MD while in the presence of [name], MD.  ***   [place] R Teague   7/23/20245:06 PM  CHIEF COMPLAINT:   Diagnosis: metastatic colon cancer to the lungs and left adrenal gland    Cancer Staging  Cecal cancer [facility]) Staging  form: Colon and Rectum, AJCC 8th Edition - Clinical stage from 10/07/2019: Stage IIIB (cT3, cN1, cM0) - Signed by [name], MD on 10/07/2019 - Pathologic stage from 01/23/2022: Stage IVB (rpTX, pN0, pM1b) - Unsigned    Prior Therapy: FOLFIRI 02/22/22 - 08/08/22   Current Therapy:  maintenance 5-FU + bevacizumab    HISTORY OF PRESENT ILLNESS:   Oncology History  Cecal cancer (HCC)  10/07/2019 Initial Diagnosis   Cecal cancer (HCC)   10/07/2019 Cancer Staging   Staging form: Colon and Rectum, AJCC 8th Edition - Clinical stage from 10/07/2019: Stage IIIB (cT3, cN1, cM0) - Signed by [name], MD on 10/07/2019   02/22/2022 - 08/08/2022 Chemotherapy   Patient is on Treatment Plan : COLORECTAL FOLFIRI / BEVACIZUMAB Q14D     02/22/2022 -  Chemotherapy   Patient is on Treatment Plan : COLORECTAL FOLFIRI + Bevacizumab q14d        INTERVAL HISTORY:   Evan [name] is a 67 y.o. male presenting to clinic today for follow up of metastatic colon cancer to the lungs and left adrenal gland. He was last seen by me on 05/23/23.  Today, he states that he is doing well overall. His appetite level is at ***%. His energy level is at ***%.  PAST MEDICAL HISTORY:   Past Medical History: Past Medical History:  Diagnosis Date   Arthritis    Colon cancer (HCC)    colon   Hepatitis C    Hypertension    Port-A-Cath in place 02/15/2022    Surgical History: Past Surgical History:  Procedure Laterality Date   BIOPSY  03/12/2023   Procedure: BIOPSY;  Surgeon: Lanelle Bal, DO;  [facility];  Service: Endoscopy;;   COLON SURGERY     ESOPHAGEAL BANDING N/A 03/12/2023   Procedure: ESOPHAGEAL BANDING;  Surgeon: Lanelle Bal, DO;  Location: [facility];  Service: Endoscopy;  Laterality: N/A;   ESOPHAGOGASTRODUODENOSCOPY (EGD) WITH PROPOFOL N/A 03/12/2023   Procedure: ESOPHAGOGASTRODUODENOSCOPY (EGD) WITH PROPOFOL;  Surgeon: Lanelle Bal, DO;  Location: [facility];   Service: Endoscopy;  Laterality: N/A;  11:30AM; ASA 3   PORTACATH PLACEMENT Right 02/08/2022   Procedure: INSERTION PORT-A-CATH- RIJ;  Surgeon: Lewie Chamber, DO;  Location: [facility];  Service: General;  Laterality: Right;   REPLACEMENT TOTAL KNEE Left    SHOULDER ARTHROSCOPY Bilateral    TOTAL HIP ARTHROPLASTY Right 11/09/2020   Procedure: TOTAL HIP ARTHROPLASTY ANTERIOR APPROACH;  Surgeon: Sheral Apley, MD;  Location: [facility];  Service: Orthopedics;  Laterality: Right;   WRIST SURGERY Left     Social History: Social History   Socioeconomic History   Marital status: Married    Spouse name: Not on file   Number of children: 7   Years of education: Not on file   Highest education level: Not on file  Occupational History   Occupation: employed  Tobacco Use   Smoking status: Former    Current packs/day: 0.00    Average packs/day: 1.5 packs/day for 20.0 years (30.0  ttl pk-yrs)    Types: Cigarettes    Start date: 11/19/1985    Quit date: 11/19/2005    Years since quitting: 17.6   Smokeless tobacco: Never  Vaping Use   Vaping status: Never Used  Substance and Sexual Activity   Alcohol use: Not Currently   Drug use: Never   Sexual activity: Yes  Other Topics Concern   Not on file  Social History Narrative   ** Merged History Encounter **       Separated from wife 11/2021   Social Determinants of Health   Financial Resource Strain: Low Risk  (10/07/2019)   Overall Financial Resource Strain (CARDIA)    Difficulty of Paying Living Expenses: Not very hard  Food Insecurity: No Food Insecurity (10/07/2019)   Hunger Vital Sign    Worried About Running Out of Food in the Last Year: Never true    Ran Out of Food in the Last Year: Never true  Transportation Needs: No Transportation Needs (10/07/2019)   PRAPARE - Administrator, Civil Service (Medical): No    Lack of Transportation (Non-Medical): No  Physical Activity: Inactive (10/07/2019)   Exercise Vital  Sign    Days of Exercise per Week: 0 days    Minutes of Exercise per Session: 0 min  Stress: No Stress Concern Present (10/07/2019)   [employer] of Occupational Health - Occupational Stress Questionnaire    Feeling of Stress : Not at all  Social Connections: Moderately Integrated (10/07/2019)   Social Connection and Isolation Panel [NHANES]    Frequency of Communication with Friends and Family: Once a week    Frequency of Social Gatherings with Friends and Family: Once a week    Attends Religious Services: More than 4 times per year    Active Member of [employer] or Organizations: Yes    Attends Engineer, structural: More than 4 times per year    Marital Status: Married  Catering manager Violence: Not At Risk (10/07/2019)   Humiliation, Afraid, Rape, and Kick questionnaire    Fear of Current or Ex-Partner: No    Emotionally Abused: No    Physically Abused: No    Sexually Abused: No    Family History: Family History  Problem Relation Age of Onset   Heart disease Mother    Dementia Father    Heart disease Sister    Cancer Brother    Hypertension Brother    Hypertension Brother    Healthy Son    Healthy Son    Healthy Son    Healthy Son    Healthy Daughter    Healthy Daughter    Healthy Daughter     Current Medications:  Current Outpatient Medications:    aluminum-magnesium hydroxide-simethicone (MAALOX) 200-200-20 MG/5ML SUSP, Take 30 mLs by mouth 4 (four) times daily -  before meals and at bedtime., Disp: 1680 mL, Rfl: 2   amLODipine (NORVASC) 10 MG tablet, Take 1 tablet (10 mg total) by mouth daily., Disp: 90 tablet, Rfl: 3   Bevacizumab (AVASTIN IV), Inject into the vein every 14 (fourteen) days. *start date TBD, Disp: , Rfl:    fluorouracil CALGB 82956 2,400 mg/m2 in sodium chloride 0.9 % 150 mL, Inject 2,400 mg/m2 into the vein over 48 hr., Disp: , Rfl:    FLUOROURACIL IV, Inject into the vein every 14 (fourteen) days., Disp: , Rfl:    Lactulose 20  GM/30ML SOLN, Take 15 mLs (10 g total) by mouth at bedtime. Take 15 ml  at bedtime every night to assist with regular bowel movements.  Titrate down if having multiple bowel movements.  If a bowel movement has not occurred in 3 to 4 days or longer, then take 15 ml every 3 hours until a bowel movent has occurred., Disp: 450 mL, Rfl: 5   LEUCOVORIN CALCIUM IV, Inject into the vein every 14 (fourteen) days., Disp: , Rfl:    Oxycodone HCl 10 MG TABS, Take 1 tablet (10 mg total) by mouth every 4 (four) hours as needed., Disp: 168 tablet, Rfl: 0   pantoprazole (PROTONIX) 40 MG tablet, Take 1 tablet (40 mg total) by mouth daily., Disp: 30 tablet, Rfl: 11   potassium chloride SA (KLOR-CON M) 20 MEQ tablet, Take 1 tablet (20 mEq total) by mouth daily., Disp: 30 tablet, Rfl: 4   Allergies: No Known Allergies  REVIEW OF SYSTEMS:   Review of Systems  Constitutional:  Negative for chills, fatigue and fever.  HENT:   Negative for lump/mass, mouth sores, nosebleeds, sore throat and trouble swallowing.   Eyes:  Negative for eye problems.  Respiratory:  Negative for cough and shortness of breath.   Cardiovascular:  Negative for chest pain, leg swelling and palpitations.  Gastrointestinal:  Negative for abdominal pain, constipation, diarrhea, nausea and vomiting.  Genitourinary:  Negative for bladder incontinence, difficulty urinating, dysuria, frequency, hematuria and nocturia.   Musculoskeletal:  Negative for arthralgias, back pain, flank pain, myalgias and neck pain.  Skin:  Negative for itching and rash.  Neurological:  Negative for dizziness, headaches and numbness.  Hematological:  Does not bruise/bleed easily.  Psychiatric/Behavioral:  Negative for depression, sleep disturbance and suicidal ideas. The patient is not nervous/anxious.   All other systems reviewed and are negative.    VITALS:   There were no vitals taken for this visit.  Wt Readings from Last 3 Encounters:  06/20/23 183 lb 9.6 oz  (83.3 kg)  06/06/23 181 lb 6.4 oz (82.3 kg)  05/23/23 183 lb 3.2 oz (83.1 kg)    There is no height or weight on file to calculate BMI.  Performance status (ECOG): 1 - Symptomatic but completely ambulatory  PHYSICAL EXAM:   Physical Exam Vitals and nursing note reviewed. Exam conducted with a chaperone present.  Constitutional:      Appearance: Normal appearance.  Cardiovascular:     Rate and Rhythm: Normal rate and regular rhythm.     Pulses: Normal pulses.     Heart sounds: Normal heart sounds.  Pulmonary:     Effort: Pulmonary effort is normal.     Breath sounds: Normal breath sounds.  Abdominal:     Palpations: Abdomen is soft. There is no hepatomegaly, splenomegaly or mass.     Tenderness: There is no abdominal tenderness.  Musculoskeletal:     Right lower leg: No edema.     Left lower leg: No edema.  Lymphadenopathy:     Cervical: No cervical adenopathy.     Right cervical: No superficial, deep or posterior cervical adenopathy.    Left cervical: No superficial, deep or posterior cervical adenopathy.     Upper Body:     Right upper body: No supraclavicular or axillary adenopathy.     Left upper body: No supraclavicular or axillary adenopathy.  Neurological:     General: No focal deficit present.     Mental Status: He is alert and oriented to person, place, and time.  Psychiatric:        Mood and Affect: Mood normal.  Behavior: Behavior normal.     LABS:      Latest Ref Rng & Units 06/20/2023   10:00 AM 06/06/2023   10:28 AM 05/23/2023   10:10 AM  CBC  WBC 4.0 - 10.5 K/uL 5.7  5.2  5.6   Hemoglobin 13.0 - 17.0 g/dL 16.1  09.6  04.5   Hematocrit 39.0 - 52.0 % 36.6  37.9  37.6   Platelets 150 - 400 K/uL 121  136  140       Latest Ref Rng & Units 06/20/2023   10:00 AM 06/06/2023   10:28 AM 05/23/2023   10:10 AM  CMP  Glucose 70 - 99 mg/dL 409  811  914   BUN 8 - 23 mg/dL 11  15  10    Creatinine 0.61 - 1.24 mg/dL 7.82  9.56  2.13   Sodium 135 - 145  mmol/L 133  134  135   Potassium 3.5 - 5.1 mmol/L 3.8  3.8  3.3   Chloride 98 - 111 mmol/L 101  100  103   CO2 22 - 32 mmol/L 24  25  25    Calcium 8.9 - 10.3 mg/dL 9.0  9.2  8.8   Total Protein 6.5 - 8.1 g/dL 7.5  7.8  7.5   Total Bilirubin 0.3 - 1.2 mg/dL 1.7  1.5  1.0   Alkaline Phos 38 - 126 U/L 38  37  39   AST 15 - 41 U/L 23  20  21    ALT 0 - 44 U/L 9  7  9       Lab Results  Component Value Date   CEA1 59.2 (H) 05/23/2023   /  CEA  Date Value Ref Range Status  05/23/2023 59.2 (H) 0.0 - 4.7 ng/mL Final    Comment:    (NOTE)                             Nonsmokers          <3.9                             Smokers             <5.6 Roche [employer] Electrochemiluminescence Immunoassay (ECLIA) Values obtained with different assay methods or kits cannot be used interchangeably.  Results cannot be interpreted as absolute evidence of the presence or absence of malignant disease. Performed At: [facility] [address] [place], [place] [id number] Jolene Schimke MD [id number]    No results found for: "PSA1" No results found for: "CAN199" No results found for: "CAN125"  No results found for: "TOTALPROTELP", "ALBUMINELP", "A1GS", "A2GS", "BETS", "BETA2SER", "GAMS", "MSPIKE", "SPEI" Lab Results  Component Value Date   TIBC 406 01/16/2023   TIBC 294 06/06/2022   TIBC 237 (L) 02/22/2022   FERRITIN 150 01/16/2023   FERRITIN 243 06/06/2022   FERRITIN 292 02/22/2022   IRONPCTSAT 18 01/16/2023   IRONPCTSAT 24 06/06/2022   IRONPCTSAT 11 (L) 02/22/2022   Lab Results  Component Value Date   LDH 258 (H) 12/26/2021     STUDIES:   No results found.

## 2023-07-04 ENCOUNTER — Inpatient Hospital Stay: Payer: Medicare Other

## 2023-07-04 ENCOUNTER — Inpatient Hospital Stay (HOSPITAL_BASED_OUTPATIENT_CLINIC_OR_DEPARTMENT_OTHER): Payer: Medicare Other | Admitting: Hematology

## 2023-07-04 VITALS — BP 120/75 | HR 65 | Temp 97.7°F | Resp 18 | Wt 183.5 lb

## 2023-07-04 DIAGNOSIS — C189 Malignant neoplasm of colon, unspecified: Secondary | ICD-10-CM

## 2023-07-04 DIAGNOSIS — Z95828 Presence of other vascular implants and grafts: Secondary | ICD-10-CM

## 2023-07-04 DIAGNOSIS — Z79899 Other long term (current) drug therapy: Secondary | ICD-10-CM | POA: Diagnosis not present

## 2023-07-04 DIAGNOSIS — C7802 Secondary malignant neoplasm of left lung: Secondary | ICD-10-CM | POA: Diagnosis not present

## 2023-07-04 DIAGNOSIS — I10 Essential (primary) hypertension: Secondary | ICD-10-CM | POA: Diagnosis not present

## 2023-07-04 DIAGNOSIS — C180 Malignant neoplasm of cecum: Secondary | ICD-10-CM | POA: Diagnosis not present

## 2023-07-04 DIAGNOSIS — G893 Neoplasm related pain (acute) (chronic): Secondary | ICD-10-CM | POA: Diagnosis not present

## 2023-07-04 DIAGNOSIS — C7801 Secondary malignant neoplasm of right lung: Secondary | ICD-10-CM | POA: Diagnosis not present

## 2023-07-04 DIAGNOSIS — Z5112 Encounter for antineoplastic immunotherapy: Secondary | ICD-10-CM | POA: Diagnosis not present

## 2023-07-04 DIAGNOSIS — Z5111 Encounter for antineoplastic chemotherapy: Secondary | ICD-10-CM | POA: Diagnosis not present

## 2023-07-04 DIAGNOSIS — C7972 Secondary malignant neoplasm of left adrenal gland: Secondary | ICD-10-CM | POA: Diagnosis not present

## 2023-07-04 DIAGNOSIS — Z87891 Personal history of nicotine dependence: Secondary | ICD-10-CM | POA: Diagnosis not present

## 2023-07-04 LAB — CBC WITH DIFFERENTIAL/PLATELET
Abs Immature Granulocytes: 0.02 10*3/uL (ref 0.00–0.07)
Basophils Absolute: 0.1 10*3/uL (ref 0.0–0.1)
Basophils Relative: 2 %
Eosinophils Absolute: 0.2 10*3/uL (ref 0.0–0.5)
Eosinophils Relative: 4 %
HCT: 38.1 % — ABNORMAL LOW (ref 39.0–52.0)
Hemoglobin: 12.4 g/dL — ABNORMAL LOW (ref 13.0–17.0)
Immature Granulocytes: 1 %
Lymphocytes Relative: 47 %
Lymphs Abs: 2.1 10*3/uL (ref 0.7–4.0)
MCH: 31.4 pg (ref 26.0–34.0)
MCHC: 32.5 g/dL (ref 30.0–36.0)
MCV: 96.5 fL (ref 80.0–100.0)
Monocytes Absolute: 0.7 10*3/uL (ref 0.1–1.0)
Monocytes Relative: 16 %
Neutro Abs: 1.3 10*3/uL — ABNORMAL LOW (ref 1.7–7.7)
Neutrophils Relative %: 30 %
Platelets: 141 10*3/uL — ABNORMAL LOW (ref 150–400)
RBC: 3.95 MIL/uL — ABNORMAL LOW (ref 4.22–5.81)
RDW: 15.2 % (ref 11.5–15.5)
WBC: 4.4 10*3/uL (ref 4.0–10.5)
nRBC: 0 % (ref 0.0–0.2)

## 2023-07-04 LAB — COMPREHENSIVE METABOLIC PANEL
ALT: 9 U/L (ref 0–44)
AST: 19 U/L (ref 15–41)
Albumin: 3.8 g/dL (ref 3.5–5.0)
Alkaline Phosphatase: 39 U/L (ref 38–126)
Anion gap: 4 — ABNORMAL LOW (ref 5–15)
BUN: 10 mg/dL (ref 8–23)
CO2: 25 mmol/L (ref 22–32)
Calcium: 8.9 mg/dL (ref 8.9–10.3)
Chloride: 106 mmol/L (ref 98–111)
Creatinine, Ser: 1 mg/dL (ref 0.61–1.24)
GFR, Estimated: >60 mL/min (ref >60)
Glucose, Bld: 81 mg/dL (ref 70–99)
Potassium: 3.6 mmol/L (ref 3.5–5.1)
Sodium: 135 mmol/L (ref 135–145)
Total Bilirubin: 0.6 mg/dL (ref 0.3–1.2)
Total Protein: 7.6 g/dL (ref 6.5–8.1)

## 2023-07-04 LAB — MAGNESIUM: Magnesium: 2.1 mg/dL (ref 1.7–2.4)

## 2023-07-04 MED ORDER — SODIUM CHLORIDE 0.9 % IV SOLN: Freq: Once | INTRAVENOUS | Status: AC

## 2023-07-04 MED ORDER — FLUOROURACIL CHEMO INJECTION 2.5 GM/50ML
400.0000 mg/m2 | Freq: Once | INTRAVENOUS | Status: AC
  Administered 2023-07-04: 800 mg via INTRAVENOUS
  Filled 2023-07-04: qty 16

## 2023-07-04 MED ORDER — SODIUM CHLORIDE 0.9 % IV SOLN
2400.0000 mg/m2 | INTRAVENOUS | Status: DC
  Administered 2023-07-04: 5000 mg via INTRAVENOUS
  Filled 2023-07-04: qty 100

## 2023-07-04 MED ORDER — SODIUM CHLORIDE 0.9% FLUSH: 10.0000 mL | INTRAVENOUS | Status: DC | PRN

## 2023-07-04 MED ORDER — MEGESTROL ACETATE 400 MG/10ML PO SUSP: 400.0000 mg | Freq: Two times a day (BID) | ORAL | 3 refills | Status: DC

## 2023-07-04 MED ORDER — PALONOSETRON HCL INJECTION 0.25 MG/5ML
0.2500 mg | Freq: Once | INTRAVENOUS | Status: AC
  Administered 2023-07-04: 0.25 mg via INTRAVENOUS
  Filled 2023-07-04: qty 5

## 2023-07-04 MED ORDER — SODIUM CHLORIDE 0.9 % IV SOLN
5.0000 mg/kg | Freq: Once | INTRAVENOUS | Status: AC
  Administered 2023-07-04: 400 mg via INTRAVENOUS
  Filled 2023-07-04: qty 16

## 2023-07-04 MED ORDER — SODIUM CHLORIDE 0.9 % IV SOLN
10.0000 mg | Freq: Once | INTRAVENOUS | Status: AC
  Administered 2023-07-04: 10 mg via INTRAVENOUS
  Filled 2023-07-04: qty 10

## 2023-07-04 MED ORDER — SODIUM CHLORIDE 0.9 % IV SOLN
400.0000 mg/m2 | Freq: Once | INTRAVENOUS | Status: AC
  Administered 2023-07-04: 780 mg via INTRAVENOUS
  Filled 2023-07-04: qty 39

## 2023-07-04 MED ORDER — SODIUM CHLORIDE 0.9% FLUSH
10.0000 mL | INTRAVENOUS | Status: DC | PRN
  Administered 2023-07-04: 10 mL via INTRAVENOUS

## 2023-07-04 NOTE — Patient Instructions (Signed)
[facility] [place]  Discharge Instructions: Thank you for choosing [facility] to provide your oncology and hematology care.  If you have a lab appointment with the Cancer Center - please note that after April 8th, 2024, all labs will be drawn in the cancer center.  You do not have to check in or register with the main entrance as you have in the past but will complete your check-in in the cancer center.  Wear comfortable clothing and clothing appropriate for easy access to any Portacath or PICC line.   We strive to give you quality time with your provider. You may need to reschedule your appointment if you arrive late (15 or more minutes).  Arriving late affects you and other patients whose appointments are after yours.  Also, if you miss three or more appointments without notifying the office, you may be dismissed from the clinic at the provider's discretion.      For prescription refill requests, have your pharmacy contact our office and allow 72 hours for refills to be completed.    Today you received the following chemotherapy and/or immunotherapy agents MVASI, leucovorin, adruicil.       To help prevent nausea and vomiting after your treatment, we encourage you to take your nausea medication as directed.  BELOW ARE SYMPTOMS THAT SHOULD BE REPORTED IMMEDIATELY: *FEVER GREATER THAN 100.4 F (38 C) OR HIGHER *CHILLS OR SWEATING *NAUSEA AND VOMITING THAT IS NOT CONTROLLED WITH YOUR NAUSEA MEDICATION *UNUSUAL SHORTNESS OF BREATH *UNUSUAL BRUISING OR BLEEDING *URINARY PROBLEMS (pain or burning when urinating, or frequent urination) *BOWEL PROBLEMS (unusual diarrhea, constipation, pain near the anus) TENDERNESS IN MOUTH AND THROAT WITH OR WITHOUT PRESENCE OF ULCERS (sore throat, sores in mouth, or a toothache) UNUSUAL RASH, SWELLING OR PAIN  UNUSUAL VAGINAL DISCHARGE OR ITCHING   Items with * indicate a potential emergency and should be followed up as soon as  possible or go to the Emergency Department if any problems should occur.  Please show the CHEMOTHERAPY ALERT CARD or IMMUNOTHERAPY ALERT CARD at check-in to the Emergency Department and triage nurse.  Should you have questions after your visit or need to cancel or reschedule your appointment, please contact [facility] [phone]  and follow the prompts.  Office hours are 8:00 a.m. to 4:30 p.m. Monday - Friday. Please note that voicemails left after 4:00 p.m. may not be returned until the following business day.  We are closed weekends and major holidays. You have access to a nurse at all times for urgent questions. Please call the main number to the clinic [phone] and follow the prompts.  For any non-urgent questions, you may also contact your provider using MyChart. We now offer e-Visits for anyone 66 and older to request care online for non-urgent symptoms. For details visit [email or web address].   Also download the MyChart app! Go to the app store, search "MyChart", open the app, select [facility], and log in with your MyChart username and password.

## 2023-07-04 NOTE — Progress Notes (Signed)
ANC 1.3 for today's treatment.   Patient tolerated chemotherapy with no complaints voiced.  Side effects with management reviewed with understanding verbalized.  Port site clean and dry with no bruising or swelling noted at site.  Good blood return noted before and after administration of chemotherapy.  Chemo pump connected with no alarms noted.  Patient left in satisfactory condition with VSS and no s/s of distress noted.

## 2023-07-04 NOTE — Progress Notes (Signed)
Patient has been examined by Dr. Ellin Saba. Vital signs and labs have been reviewed by MD - ANC (1.3), Creatinine, LFTs, hemoglobin, and platelets are within treatment parameters per M.D. - pt may proceed with treatment.  Primary RN and pharmacy notified.

## 2023-07-05 ENCOUNTER — Other Ambulatory Visit: Payer: Self-pay

## 2023-07-05 LAB — CEA: CEA: 78.2 ng/mL — ABNORMAL HIGH (ref 0.0–4.7)

## 2023-07-06 ENCOUNTER — Inpatient Hospital Stay: Payer: Medicare Other

## 2023-07-06 VITALS — BP 148/86 | HR 52 | Temp 98.6°F | Resp 16

## 2023-07-06 DIAGNOSIS — C180 Malignant neoplasm of cecum: Secondary | ICD-10-CM | POA: Diagnosis not present

## 2023-07-06 DIAGNOSIS — I10 Essential (primary) hypertension: Secondary | ICD-10-CM | POA: Diagnosis not present

## 2023-07-06 DIAGNOSIS — C7802 Secondary malignant neoplasm of left lung: Secondary | ICD-10-CM | POA: Diagnosis not present

## 2023-07-06 DIAGNOSIS — Z79899 Other long term (current) drug therapy: Secondary | ICD-10-CM | POA: Diagnosis not present

## 2023-07-06 DIAGNOSIS — C7801 Secondary malignant neoplasm of right lung: Secondary | ICD-10-CM | POA: Diagnosis not present

## 2023-07-06 DIAGNOSIS — Z5111 Encounter for antineoplastic chemotherapy: Secondary | ICD-10-CM | POA: Diagnosis not present

## 2023-07-06 DIAGNOSIS — Z87891 Personal history of nicotine dependence: Secondary | ICD-10-CM | POA: Diagnosis not present

## 2023-07-06 DIAGNOSIS — Z95828 Presence of other vascular implants and grafts: Secondary | ICD-10-CM

## 2023-07-06 DIAGNOSIS — C7972 Secondary malignant neoplasm of left adrenal gland: Secondary | ICD-10-CM | POA: Diagnosis not present

## 2023-07-06 DIAGNOSIS — G893 Neoplasm related pain (acute) (chronic): Secondary | ICD-10-CM | POA: Diagnosis not present

## 2023-07-06 DIAGNOSIS — Z5112 Encounter for antineoplastic immunotherapy: Secondary | ICD-10-CM | POA: Diagnosis not present

## 2023-07-06 MED ORDER — SODIUM CHLORIDE 0.9% FLUSH
10.0000 mL | INTRAVENOUS | Status: DC | PRN
  Administered 2023-07-06: 10 mL

## 2023-07-06 MED ORDER — HEPARIN SOD (PORK) LOCK FLUSH 100 UNIT/ML IV SOLN
500.0000 [IU] | Freq: Once | INTRAVENOUS | Status: AC | PRN
  Administered 2023-07-06: 500 [IU]

## 2023-07-06 NOTE — Progress Notes (Signed)
Evan [name] presents to have home infusion pump d/c'd and for port-a-cath deaccess with flush.  Portacath located right chest wall accessed with  H 20 needle.  Good blood return present. Portacath flushed with NS and 500U/60ml Heparin, and needle removed intact.  Procedure tolerated well and without incident.  Vitals stable and discharged home from clinic ambulatory. Follow up as scheduled.

## 2023-07-06 NOTE — Patient Instructions (Signed)
[facility] [place]  Discharge Instructions: Thank you for choosing [facility] to provide your oncology and hematology care.  If you have a lab appointment with the Cancer Center - please note that after April 8th, 2024, all labs will be drawn in the cancer center.  You do not have to check in or register with the main entrance as you have in the past but will complete your check-in in the cancer center.  Wear comfortable clothing and clothing appropriate for easy access to any Portacath or PICC line.   We strive to give you quality time with your provider. You may need to reschedule your appointment if you arrive late (15 or more minutes).  Arriving late affects you and other patients whose appointments are after yours.  Also, if you miss three or more appointments without notifying the office, you may be dismissed from the clinic at the provider's discretion.      For prescription refill requests, have your pharmacy contact our office and allow 72 hours for refills to be completed.    Today you had your ambulatory pump disconnected per protocol.    To help prevent nausea and vomiting after your treatment, we encourage you to take your nausea medication as directed.  BELOW ARE SYMPTOMS THAT SHOULD BE REPORTED IMMEDIATELY: *FEVER GREATER THAN 100.4 F (38 C) OR HIGHER *CHILLS OR SWEATING *NAUSEA AND VOMITING THAT IS NOT CONTROLLED WITH YOUR NAUSEA MEDICATION *UNUSUAL SHORTNESS OF BREATH *UNUSUAL BRUISING OR BLEEDING *URINARY PROBLEMS (pain or burning when urinating, or frequent urination) *BOWEL PROBLEMS (unusual diarrhea, constipation, pain near the anus) TENDERNESS IN MOUTH AND THROAT WITH OR WITHOUT PRESENCE OF ULCERS (sore throat, sores in mouth, or a toothache) UNUSUAL RASH, SWELLING OR PAIN  UNUSUAL VAGINAL DISCHARGE OR ITCHING   Items with * indicate a potential emergency and should be followed up as soon as possible or go to the Emergency Department if  any problems should occur.  Please show the CHEMOTHERAPY ALERT CARD or IMMUNOTHERAPY ALERT CARD at check-in to the Emergency Department and triage nurse.  Should you have questions after your visit or need to cancel or reschedule your appointment, please contact [facility] [phone]  and follow the prompts.  Office hours are 8:00 a.m. to 4:30 p.m. Monday - Friday. Please note that voicemails left after 4:00 p.m. may not be returned until the following business day.  We are closed weekends and major holidays. You have access to a nurse at all times for urgent questions. Please call the main number to the clinic [phone] and follow the prompts.  For any non-urgent questions, you may also contact your provider using MyChart. We now offer e-Visits for anyone 25 and older to request care online for non-urgent symptoms. For details visit [email or web address].   Also download the MyChart app! Go to the app store, search "MyChart", open the app, select [facility], and log in with your MyChart username and password.

## 2023-07-17 ENCOUNTER — Other Ambulatory Visit: Payer: Self-pay

## 2023-07-17 MED ORDER — OXYCODONE HCL 10 MG PO TABS: 10.0000 mg | ORAL_TABLET | ORAL | 0 refills | Status: DC | PRN

## 2023-07-17 MED ORDER — POTASSIUM CHLORIDE CRYS ER 20 MEQ PO TBCR: 20.0000 meq | EXTENDED_RELEASE_TABLET | Freq: Every day | ORAL | 4 refills | Status: DC

## 2023-07-18 ENCOUNTER — Other Ambulatory Visit: Payer: Self-pay

## 2023-07-18 ENCOUNTER — Inpatient Hospital Stay: Payer: Medicare Other | Attending: Hematology

## 2023-07-18 ENCOUNTER — Inpatient Hospital Stay: Payer: Medicare Other

## 2023-07-18 VITALS — BP 153/86 | HR 55 | Temp 97.4°F | Resp 18

## 2023-07-18 VITALS — BP 125/76 | HR 66 | Temp 96.1°F | Resp 18 | Wt 190.3 lb

## 2023-07-18 DIAGNOSIS — Z5112 Encounter for antineoplastic immunotherapy: Secondary | ICD-10-CM | POA: Insufficient documentation

## 2023-07-18 DIAGNOSIS — C180 Malignant neoplasm of cecum: Secondary | ICD-10-CM | POA: Diagnosis not present

## 2023-07-18 DIAGNOSIS — C7801 Secondary malignant neoplasm of right lung: Secondary | ICD-10-CM | POA: Insufficient documentation

## 2023-07-18 DIAGNOSIS — Z87891 Personal history of nicotine dependence: Secondary | ICD-10-CM | POA: Diagnosis not present

## 2023-07-18 DIAGNOSIS — Z5111 Encounter for antineoplastic chemotherapy: Secondary | ICD-10-CM | POA: Insufficient documentation

## 2023-07-18 DIAGNOSIS — C7972 Secondary malignant neoplasm of left adrenal gland: Secondary | ICD-10-CM | POA: Insufficient documentation

## 2023-07-18 DIAGNOSIS — C7802 Secondary malignant neoplasm of left lung: Secondary | ICD-10-CM | POA: Diagnosis not present

## 2023-07-18 DIAGNOSIS — Z95828 Presence of other vascular implants and grafts: Secondary | ICD-10-CM

## 2023-07-18 LAB — COMPREHENSIVE METABOLIC PANEL
ALT: 10 U/L (ref 0–44)
AST: 19 U/L (ref 15–41)
Albumin: 3.8 g/dL (ref 3.5–5.0)
Alkaline Phosphatase: 46 U/L (ref 38–126)
Anion gap: 10 (ref 5–15)
BUN: 12 mg/dL (ref 8–23)
CO2: 23 mmol/L (ref 22–32)
Calcium: 8.9 mg/dL (ref 8.9–10.3)
Chloride: 102 mmol/L (ref 98–111)
Creatinine, Ser: 1.15 mg/dL (ref 0.61–1.24)
GFR, Estimated: >60 mL/min (ref >60)
Glucose, Bld: 117 mg/dL — ABNORMAL HIGH (ref 70–99)
Potassium: 3.9 mmol/L (ref 3.5–5.1)
Sodium: 135 mmol/L (ref 135–145)
Total Bilirubin: 0.6 mg/dL (ref 0.3–1.2)
Total Protein: 6.9 g/dL (ref 6.5–8.1)

## 2023-07-18 LAB — URINALYSIS, DIPSTICK ONLY
Bilirubin Urine: NEGATIVE
Glucose, UA: NEGATIVE mg/dL
Hgb urine dipstick: NEGATIVE
Ketones, ur: NEGATIVE mg/dL
Leukocytes,Ua: NEGATIVE
Nitrite: NEGATIVE
Protein, ur: NEGATIVE mg/dL
Specific Gravity, Urine: 1.02 (ref 1.005–1.030)
pH: 5 (ref 5.0–8.0)

## 2023-07-18 LAB — CBC WITH DIFFERENTIAL/PLATELET
Abs Immature Granulocytes: 0.04 10*3/uL (ref 0.00–0.07)
Basophils Absolute: 0.1 10*3/uL (ref 0.0–0.1)
Basophils Relative: 1 %
Eosinophils Absolute: 0.2 10*3/uL (ref 0.0–0.5)
Eosinophils Relative: 3 %
HCT: 36 % — ABNORMAL LOW (ref 39.0–52.0)
Hemoglobin: 12 g/dL — ABNORMAL LOW (ref 13.0–17.0)
Immature Granulocytes: 1 %
Lymphocytes Relative: 48 %
Lymphs Abs: 3 10*3/uL (ref 0.7–4.0)
MCH: 31.7 pg (ref 26.0–34.0)
MCHC: 33.3 g/dL (ref 30.0–36.0)
MCV: 95.2 fL (ref 80.0–100.0)
Monocytes Absolute: 0.8 10*3/uL (ref 0.1–1.0)
Monocytes Relative: 13 %
Neutro Abs: 2.1 10*3/uL (ref 1.7–7.7)
Neutrophils Relative %: 34 %
Platelets: 123 10*3/uL — ABNORMAL LOW (ref 150–400)
RBC: 3.78 MIL/uL — ABNORMAL LOW (ref 4.22–5.81)
RDW: 16.2 % — ABNORMAL HIGH (ref 11.5–15.5)
WBC: 6.2 10*3/uL (ref 4.0–10.5)
nRBC: 0 % (ref 0.0–0.2)

## 2023-07-18 LAB — MAGNESIUM: Magnesium: 2.1 mg/dL (ref 1.7–2.4)

## 2023-07-18 MED ORDER — SODIUM CHLORIDE 0.9 % IV SOLN
400.0000 mg/m2 | Freq: Once | INTRAVENOUS | Status: AC
  Administered 2023-07-18: 780 mg via INTRAVENOUS
  Filled 2023-07-18: qty 39

## 2023-07-18 MED ORDER — SODIUM CHLORIDE 0.9 % IV SOLN
10.0000 mg | Freq: Once | INTRAVENOUS | Status: AC
  Administered 2023-07-18: 10 mg via INTRAVENOUS
  Filled 2023-07-18: qty 10

## 2023-07-18 MED ORDER — PALONOSETRON HCL INJECTION 0.25 MG/5ML
0.2500 mg | Freq: Once | INTRAVENOUS | Status: AC
  Administered 2023-07-18: 0.25 mg via INTRAVENOUS
  Filled 2023-07-18: qty 5

## 2023-07-18 MED ORDER — SODIUM CHLORIDE 0.9 % IV SOLN: Freq: Once | INTRAVENOUS | Status: AC

## 2023-07-18 MED ORDER — SODIUM CHLORIDE 0.9 % IV SOLN
2400.0000 mg/m2 | INTRAVENOUS | Status: DC
  Administered 2023-07-18: 5000 mg via INTRAVENOUS
  Filled 2023-07-18: qty 100

## 2023-07-18 MED ORDER — FLUOROURACIL CHEMO INJECTION 2.5 GM/50ML
400.0000 mg/m2 | Freq: Once | INTRAVENOUS | Status: AC
  Administered 2023-07-18: 800 mg via INTRAVENOUS
  Filled 2023-07-18: qty 16

## 2023-07-18 MED ORDER — SODIUM CHLORIDE 0.9% FLUSH
10.0000 mL | INTRAVENOUS | Status: DC | PRN
  Administered 2023-07-18: 10 mL

## 2023-07-18 MED ORDER — SODIUM CHLORIDE 0.9 % IV SOLN
5.0000 mg/kg | Freq: Once | INTRAVENOUS | Status: AC
  Administered 2023-07-18: 400 mg via INTRAVENOUS
  Filled 2023-07-18: qty 16

## 2023-07-18 NOTE — Progress Notes (Signed)
Patient tolerated chemotherapy with no complaints voiced.  Side effects with management reviewed with understanding verbalized.  Port site clean and dry with no bruising or swelling noted at site.  Good blood return noted before and after administration of chemotherapy.  Dressing intact.   Patient left in satisfactory condition with VSS and no s/s of distress noted.  

## 2023-07-18 NOTE — Progress Notes (Signed)
Evan [name] presented for Portacath access and flush.  Portacath located right chest wall accessed with  H 20 needle.  Good blood return present. Portacath flushed with 20ml NS and 500U/24ml Heparin and needle removed intact.  Procedure tolerated well and without incident.

## 2023-07-18 NOTE — Patient Instructions (Signed)
[facility]  Discharge Instructions: Thank you for choosing [facility] to provide your oncology and hematology care.  If you have a lab appointment with the Cancer Center - please note that after April 8th, 2024, all labs will be drawn in the cancer center.  You do not have to check in or register with the main entrance as you have in the past but will complete your check-in in the cancer center.  Wear comfortable clothing and clothing appropriate for easy access to any Portacath or PICC line.   We strive to give you quality time with your provider. You may need to reschedule your appointment if you arrive late (15 or more minutes).  Arriving late affects you and other patients whose appointments are after yours.  Also, if you miss three or more appointments without notifying the office, you may be dismissed from the clinic at the provider's discretion.      For prescription refill requests, have your pharmacy contact our office and allow 72 hours for refills to be completed.    Today you received the following chemotherapy and/or immunotherapy agents MVASII, leucovorin, adruicil.       To help prevent nausea and vomiting after your treatment, we encourage you to take your nausea medication as directed.  BELOW ARE SYMPTOMS THAT SHOULD BE REPORTED IMMEDIATELY: *FEVER GREATER THAN 100.4 F (38 C) OR HIGHER *CHILLS OR SWEATING *NAUSEA AND VOMITING THAT IS NOT CONTROLLED WITH YOUR NAUSEA MEDICATION *UNUSUAL SHORTNESS OF BREATH *UNUSUAL BRUISING OR BLEEDING *URINARY PROBLEMS (pain or burning when urinating, or frequent urination) *BOWEL PROBLEMS (unusual diarrhea, constipation, pain near the anus) TENDERNESS IN MOUTH AND THROAT WITH OR WITHOUT PRESENCE OF ULCERS (sore throat, sores in mouth, or a toothache) UNUSUAL RASH, SWELLING OR PAIN  UNUSUAL VAGINAL DISCHARGE OR ITCHING   Items with * indicate a potential emergency and should be followed up as soon as  possible or go to the Emergency Department if any problems should occur.  Please show the CHEMOTHERAPY ALERT CARD or IMMUNOTHERAPY ALERT CARD at check-in to the Emergency Department and triage nurse.  Should you have questions after your visit or need to cancel or reschedule your appointment, please contact [facility] AT [facility] [phone]  and follow the prompts.  Office hours are 8:00 a.m. to 4:30 p.m. Monday - Friday. Please note that voicemails left after 4:00 p.m. may not be returned until the following business day.  We are closed weekends and major holidays. You have access to a nurse at all times for urgent questions. Please call the main number to the clinic [phone] and follow the prompts.  For any non-urgent questions, you may also contact your provider using MyChart. We now offer e-Visits for anyone 38 and older to request care online for non-urgent symptoms. For details visit [email or web address].   Also download the MyChart app! Go to the app store, search "MyChart", open the app, select [place], and log in with your MyChart username and password.

## 2023-07-20 ENCOUNTER — Inpatient Hospital Stay: Payer: Medicare Other

## 2023-07-20 VITALS — BP 133/90 | HR 58 | Temp 97.9°F | Resp 18

## 2023-07-20 DIAGNOSIS — Z5111 Encounter for antineoplastic chemotherapy: Secondary | ICD-10-CM | POA: Diagnosis not present

## 2023-07-20 DIAGNOSIS — Z5112 Encounter for antineoplastic immunotherapy: Secondary | ICD-10-CM | POA: Diagnosis not present

## 2023-07-20 DIAGNOSIS — C7972 Secondary malignant neoplasm of left adrenal gland: Secondary | ICD-10-CM | POA: Diagnosis not present

## 2023-07-20 DIAGNOSIS — C7801 Secondary malignant neoplasm of right lung: Secondary | ICD-10-CM | POA: Diagnosis not present

## 2023-07-20 DIAGNOSIS — C180 Malignant neoplasm of cecum: Secondary | ICD-10-CM

## 2023-07-20 DIAGNOSIS — Z95828 Presence of other vascular implants and grafts: Secondary | ICD-10-CM

## 2023-07-20 DIAGNOSIS — Z87891 Personal history of nicotine dependence: Secondary | ICD-10-CM | POA: Diagnosis not present

## 2023-07-20 DIAGNOSIS — C7802 Secondary malignant neoplasm of left lung: Secondary | ICD-10-CM | POA: Diagnosis not present

## 2023-07-20 MED ORDER — SODIUM CHLORIDE 0.9% FLUSH
10.0000 mL | INTRAVENOUS | Status: DC | PRN
  Administered 2023-07-20: 10 mL

## 2023-07-20 MED ORDER — HEPARIN SOD (PORK) LOCK FLUSH 100 UNIT/ML IV SOLN
500.0000 [IU] | Freq: Once | INTRAVENOUS | Status: AC | PRN
  Administered 2023-07-20: 500 [IU]

## 2023-07-20 NOTE — Progress Notes (Signed)
Evan [name] presents to have home infusion pump d/c'd and for port-a-cath deaccess with flush.  Portacath located right chest wall accessed with  H 20 needle.  Good blood return present. Portacath flushed with NS and 500U/30ml Heparin, and needle removed intact.  Procedure tolerated well and without incident.

## 2023-07-20 NOTE — Patient Instructions (Signed)
[facility]  Discharge Instructions: Thank you for choosing [facility] to provide your oncology and hematology care.  If you have a lab appointment with the Cancer Center - please note that after April 8th, 2024, all labs will be drawn in the cancer center.  You do not have to check in or register with the main entrance as you have in the past but will complete your check-in in the cancer center.  Wear comfortable clothing and clothing appropriate for easy access to any Portacath or PICC line.   We strive to give you quality time with your provider. You may need to reschedule your appointment if you arrive late (15 or more minutes).  Arriving late affects you and other patients whose appointments are after yours.  Also, if you miss three or more appointments without notifying the office, you may be dismissed from the clinic at the provider's discretion.      For prescription refill requests, have your pharmacy contact our office and allow 72 hours for refills to be completed.    Today you had your ambulatory pump off.      To help prevent nausea and vomiting after your treatment, we encourage you to take your nausea medication as directed.  BELOW ARE SYMPTOMS THAT SHOULD BE REPORTED IMMEDIATELY: *FEVER GREATER THAN 100.4 F (38 C) OR HIGHER *CHILLS OR SWEATING *NAUSEA AND VOMITING THAT IS NOT CONTROLLED WITH YOUR NAUSEA MEDICATION *UNUSUAL SHORTNESS OF BREATH *UNUSUAL BRUISING OR BLEEDING *URINARY PROBLEMS (pain or burning when urinating, or frequent urination) *BOWEL PROBLEMS (unusual diarrhea, constipation, pain near the anus) TENDERNESS IN MOUTH AND THROAT WITH OR WITHOUT PRESENCE OF ULCERS (sore throat, sores in mouth, or a toothache) UNUSUAL RASH, SWELLING OR PAIN  UNUSUAL VAGINAL DISCHARGE OR ITCHING   Items with * indicate a potential emergency and should be followed up as soon as possible or go to the Emergency Department if any problems should  occur.  Please show the CHEMOTHERAPY ALERT CARD or IMMUNOTHERAPY ALERT CARD at check-in to the Emergency Department and triage nurse.  Should you have questions after your visit or need to cancel or reschedule your appointment, please contact [facility]ENTER AT [facility] [phone]  and follow the prompts.  Office hours are 8:00 a.m. to 4:30 p.m. Monday - Friday. Please note that voicemails left after 4:00 p.m. may not be returned until the following business day.  We are closed weekends and major holidays. You have access to a nurse at all times for urgent questions. Please call the main number to the clinic [phone] and follow the prompts.  For any non-urgent questions, you may also contact your provider using MyChart. We now offer e-Visits for anyone 19 and older to request care online for non-urgent symptoms. For details visit [email or web address].   Also download the MyChart app! Go to the app store, search "MyChart", open the app, select [place], and log in with your MyChart username and password.

## 2023-07-26 DIAGNOSIS — C189 Malignant neoplasm of colon, unspecified: Secondary | ICD-10-CM | POA: Diagnosis not present

## 2023-07-31 ENCOUNTER — Other Ambulatory Visit: Payer: Self-pay

## 2023-08-01 ENCOUNTER — Inpatient Hospital Stay: Payer: Medicare Other

## 2023-08-01 VITALS — BP 125/78 | HR 60 | Temp 98.0°F | Resp 18

## 2023-08-01 DIAGNOSIS — Z87891 Personal history of nicotine dependence: Secondary | ICD-10-CM | POA: Diagnosis not present

## 2023-08-01 DIAGNOSIS — Z95828 Presence of other vascular implants and grafts: Secondary | ICD-10-CM

## 2023-08-01 DIAGNOSIS — Z5112 Encounter for antineoplastic immunotherapy: Secondary | ICD-10-CM | POA: Diagnosis not present

## 2023-08-01 DIAGNOSIS — C180 Malignant neoplasm of cecum: Secondary | ICD-10-CM

## 2023-08-01 DIAGNOSIS — C7972 Secondary malignant neoplasm of left adrenal gland: Secondary | ICD-10-CM | POA: Diagnosis not present

## 2023-08-01 DIAGNOSIS — C189 Malignant neoplasm of colon, unspecified: Secondary | ICD-10-CM | POA: Diagnosis not present

## 2023-08-01 DIAGNOSIS — Z5111 Encounter for antineoplastic chemotherapy: Secondary | ICD-10-CM | POA: Diagnosis not present

## 2023-08-01 DIAGNOSIS — C7801 Secondary malignant neoplasm of right lung: Secondary | ICD-10-CM | POA: Diagnosis not present

## 2023-08-01 DIAGNOSIS — C7802 Secondary malignant neoplasm of left lung: Secondary | ICD-10-CM | POA: Diagnosis not present

## 2023-08-01 LAB — COMPREHENSIVE METABOLIC PANEL
ALT: 10 U/L (ref 0–44)
AST: 22 U/L (ref 15–41)
Albumin: 4.1 g/dL (ref 3.5–5.0)
Alkaline Phosphatase: 37 U/L — ABNORMAL LOW (ref 38–126)
Anion gap: 9 (ref 5–15)
BUN: 15 mg/dL (ref 8–23)
CO2: 22 mmol/L (ref 22–32)
Calcium: 9.4 mg/dL (ref 8.9–10.3)
Chloride: 103 mmol/L (ref 98–111)
Creatinine, Ser: 1.22 mg/dL (ref 0.61–1.24)
GFR, Estimated: >60 mL/min (ref >60)
Glucose, Bld: 111 mg/dL — ABNORMAL HIGH (ref 70–99)
Potassium: 4.3 mmol/L (ref 3.5–5.1)
Sodium: 134 mmol/L — ABNORMAL LOW (ref 135–145)
Total Bilirubin: 1.5 mg/dL — ABNORMAL HIGH (ref 0.3–1.2)
Total Protein: 7.4 g/dL (ref 6.5–8.1)

## 2023-08-01 LAB — CBC WITH DIFFERENTIAL/PLATELET
Abs Immature Granulocytes: 0.05 10*3/uL (ref 0.00–0.07)
Basophils Absolute: 0.1 10*3/uL (ref 0.0–0.1)
Basophils Relative: 1 %
Eosinophils Absolute: 0.3 10*3/uL (ref 0.0–0.5)
Eosinophils Relative: 3 %
HCT: 39.6 % (ref 39.0–52.0)
Hemoglobin: 12.8 g/dL — ABNORMAL LOW (ref 13.0–17.0)
Immature Granulocytes: 1 %
Lymphocytes Relative: 42 %
Lymphs Abs: 3.1 10*3/uL (ref 0.7–4.0)
MCH: 31.1 pg (ref 26.0–34.0)
MCHC: 32.3 g/dL (ref 30.0–36.0)
MCV: 96.4 fL (ref 80.0–100.0)
Monocytes Absolute: 1.2 10*3/uL — ABNORMAL HIGH (ref 0.1–1.0)
Monocytes Relative: 16 %
Neutro Abs: 2.8 10*3/uL (ref 1.7–7.7)
Neutrophils Relative %: 37 %
Platelets: 150 10*3/uL (ref 150–400)
RBC: 4.11 MIL/uL — ABNORMAL LOW (ref 4.22–5.81)
RDW: 17.2 % — ABNORMAL HIGH (ref 11.5–15.5)
WBC: 7.4 10*3/uL (ref 4.0–10.5)
nRBC: 0 % (ref 0.0–0.2)

## 2023-08-01 LAB — MAGNESIUM: Magnesium: 2 mg/dL (ref 1.7–2.4)

## 2023-08-01 MED ORDER — SODIUM CHLORIDE 0.9 % IV SOLN
10.0000 mg | Freq: Once | INTRAVENOUS | Status: AC
  Administered 2023-08-01: 10 mg via INTRAVENOUS
  Filled 2023-08-01: qty 1

## 2023-08-01 MED ORDER — SODIUM CHLORIDE 0.9% FLUSH
10.0000 mL | INTRAVENOUS | Status: DC | PRN
  Administered 2023-08-01: 10 mL via INTRAVENOUS

## 2023-08-01 MED ORDER — SODIUM CHLORIDE 0.9 % IV SOLN: Freq: Once | INTRAVENOUS | Status: AC

## 2023-08-01 MED ORDER — SODIUM CHLORIDE 0.9 % IV SOLN
400.0000 mg/m2 | Freq: Once | INTRAVENOUS | Status: AC
  Administered 2023-08-01: 780 mg via INTRAVENOUS
  Filled 2023-08-01: qty 39

## 2023-08-01 MED ORDER — FLUOROURACIL CHEMO INJECTION 2.5 GM/50ML
400.0000 mg/m2 | Freq: Once | INTRAVENOUS | Status: AC
  Administered 2023-08-01: 800 mg via INTRAVENOUS
  Filled 2023-08-01: qty 16

## 2023-08-01 MED ORDER — SODIUM CHLORIDE 0.9% FLUSH: 10.0000 mL | INTRAVENOUS | Status: DC | PRN

## 2023-08-01 MED ORDER — SODIUM CHLORIDE 0.9 % IV SOLN
5.0000 mg/kg | Freq: Once | INTRAVENOUS | Status: AC
  Administered 2023-08-01: 400 mg via INTRAVENOUS
  Filled 2023-08-01: qty 16

## 2023-08-01 MED ORDER — HEPARIN SOD (PORK) LOCK FLUSH 100 UNIT/ML IV SOLN: 500.0000 [IU] | Freq: Once | INTRAVENOUS | Status: DC | PRN

## 2023-08-01 MED ORDER — PALONOSETRON HCL INJECTION 0.25 MG/5ML
0.2500 mg | Freq: Once | INTRAVENOUS | Status: AC
  Administered 2023-08-01: 0.25 mg via INTRAVENOUS
  Filled 2023-08-01: qty 5

## 2023-08-01 MED ORDER — SODIUM CHLORIDE 0.9 % IV SOLN
2400.0000 mg/m2 | INTRAVENOUS | Status: DC
  Administered 2023-08-01: 5000 mg via INTRAVENOUS
  Filled 2023-08-01: qty 100

## 2023-08-01 NOTE — Patient Instructions (Signed)
[facility]  Discharge Instructions: Thank you for choosing [facility] to provide your oncology and hematology care.  If you have a lab appointment with the Cancer Center - please note that after April 8th, 2024, all labs will be drawn in the cancer center.  You do not have to check in or register with the main entrance as you have in the past but will complete your check-in in the cancer center.  Wear comfortable clothing and clothing appropriate for easy access to any Portacath or PICC line.   We strive to give you quality time with your provider. You may need to reschedule your appointment if you arrive late (15 or more minutes).  Arriving late affects you and other patients whose appointments are after yours.  Also, if you miss three or more appointments without notifying the office, you may be dismissed from the clinic at the provider's discretion.      For prescription refill requests, have your pharmacy contact our office and allow 72 hours for refills to be completed.    Today you received the following chemotherapy and/or immunotherapy agents MVASI/Leucovorin/5FU      To help prevent nausea and vomiting after your treatment, we encourage you to take your nausea medication as directed.  BELOW ARE SYMPTOMS THAT SHOULD BE REPORTED IMMEDIATELY: *FEVER GREATER THAN 100.4 F (38 C) OR HIGHER *CHILLS OR SWEATING *NAUSEA AND VOMITING THAT IS NOT CONTROLLED WITH YOUR NAUSEA MEDICATION *UNUSUAL SHORTNESS OF BREATH *UNUSUAL BRUISING OR BLEEDING *URINARY PROBLEMS (pain or burning when urinating, or frequent urination) *BOWEL PROBLEMS (unusual diarrhea, constipation, pain near the anus) TENDERNESS IN MOUTH AND THROAT WITH OR WITHOUT PRESENCE OF ULCERS (sore throat, sores in mouth, or a toothache) UNUSUAL RASH, SWELLING OR PAIN  UNUSUAL VAGINAL DISCHARGE OR ITCHING   Items with * indicate a potential emergency and should be followed up as soon as possible or  go to the Emergency Department if any problems should occur.  Please show the CHEMOTHERAPY ALERT CARD or IMMUNOTHERAPY ALERT CARD at check-in to the Emergency Department and triage nurse.  Should you have questions after your visit or need to cancel or reschedule your appointment, please contact [facility] [phone]  and follow the prompts.  Office hours are 8:00 a.m. to 4:30 p.m. Monday - Friday. Please note that voicemails left after 4:00 p.m. may not be returned until the following business day.  We are closed weekends and major holidays. You have access to a nurse at all times for urgent questions. Please call the main number to the clinic [phone] and follow the prompts.  For any non-urgent questions, you may also contact your provider using MyChart. We now offer e-Visits for anyone 27 and older to request care online for non-urgent symptoms. For details visit [email or web address].   Also download the MyChart app! Go to the app store, search "MyChart", open the app, select [place], and log in with your MyChart username and password.

## 2023-08-01 NOTE — Progress Notes (Signed)
Patients port flushed without difficulty.  Good blood return noted with no bruising or swelling noted at site.  Patient remains accessed for treatment.  

## 2023-08-01 NOTE — Progress Notes (Signed)
Patient presents today for MVASI/Leucovorin/5FU pump start per providers order.  Vital signs and labs within parameters for treatment.  Patient has no new complaints at this time.  Treatment given today per MD orders.  Stable during infusion without adverse affects.  Vital signs stable.  5FU pump start and verified RUN on the pump with patient.  No complaints at this time.  Discharge from clinic ambulatory in stable condition.  Alert and oriented X 3.  Follow up with [facility] as scheduled.

## 2023-08-02 LAB — CEA: CEA: 113 ng/mL — ABNORMAL HIGH (ref 0.0–4.7)

## 2023-08-03 ENCOUNTER — Inpatient Hospital Stay: Payer: Medicare Other

## 2023-08-03 VITALS — BP 122/83 | HR 64 | Temp 97.5°F | Resp 18

## 2023-08-03 DIAGNOSIS — C7802 Secondary malignant neoplasm of left lung: Secondary | ICD-10-CM | POA: Diagnosis not present

## 2023-08-03 DIAGNOSIS — Z5111 Encounter for antineoplastic chemotherapy: Secondary | ICD-10-CM | POA: Diagnosis not present

## 2023-08-03 DIAGNOSIS — C7801 Secondary malignant neoplasm of right lung: Secondary | ICD-10-CM | POA: Diagnosis not present

## 2023-08-03 DIAGNOSIS — Z87891 Personal history of nicotine dependence: Secondary | ICD-10-CM | POA: Diagnosis not present

## 2023-08-03 DIAGNOSIS — Z5112 Encounter for antineoplastic immunotherapy: Secondary | ICD-10-CM | POA: Diagnosis not present

## 2023-08-03 DIAGNOSIS — Z95828 Presence of other vascular implants and grafts: Secondary | ICD-10-CM

## 2023-08-03 DIAGNOSIS — C180 Malignant neoplasm of cecum: Secondary | ICD-10-CM

## 2023-08-03 DIAGNOSIS — C7972 Secondary malignant neoplasm of left adrenal gland: Secondary | ICD-10-CM | POA: Diagnosis not present

## 2023-08-03 MED ORDER — SODIUM CHLORIDE 0.9% FLUSH
10.0000 mL | INTRAVENOUS | Status: DC | PRN
  Administered 2023-08-03: 10 mL

## 2023-08-03 MED ORDER — HEPARIN SOD (PORK) LOCK FLUSH 100 UNIT/ML IV SOLN
500.0000 [IU] | Freq: Once | INTRAVENOUS | Status: AC | PRN
  Administered 2023-08-03: 500 [IU]

## 2023-08-03 NOTE — Progress Notes (Signed)
Patient presents for pump D/C.  Upon removing pump from pouch it was noted that the pump was already stopped and only 43 ml had infused.  Per Dr. Ellin Saba, remove and discharge.  Pryor Ochoa [facility] made aware.  Port flushed with 10 ml of NS and 500 units of heparin per protocol.  Site CDI and band aid applied.  Patient discharged ambulatory in stable condition.

## 2023-08-06 ENCOUNTER — Other Ambulatory Visit: Payer: Self-pay | Admitting: *Deleted

## 2023-08-06 DIAGNOSIS — I10 Essential (primary) hypertension: Secondary | ICD-10-CM

## 2023-08-06 MED ORDER — AMLODIPINE BESYLATE 10 MG PO TABS
10.0000 mg | ORAL_TABLET | Freq: Every day | ORAL | 3 refills | Status: DC
Start: 2023-08-06 — End: 2023-12-25

## 2023-08-08 ENCOUNTER — Ambulatory Visit (HOSPITAL_COMMUNITY): Admission: RE | Admit: 2023-08-08 | Payer: Medicare Other | Source: Ambulatory Visit

## 2023-08-14 NOTE — Progress Notes (Signed)
[facility] [address] [place] [address]    Clinic Day:  08/15/2023  Referring physician: [name], PA  Patient Care Team: [name], [name] as PCP - General (Physician Assistant) [name], MD as Medical Oncologist (Medical Oncology) Therese Sarah, RN as Oncology Nurse Navigator (Medical Oncology)   ASSESSMENT & PLAN:   Assessment: 1. Left lung mass with multiple lung nodules: - Presentation with dry cough for 6 months. - 30 pound weight loss in the last couple of years, weight stable over the last 6 months. - CT chest with contrast on 12/16/2021 showed bulky left hilar mass measuring 8.1 x 7.5 cm.  Numerous bilateral lung nodules of varying sizes.  Left adrenal nodule measuring 2.8 x 2.2 cm.  Mediastinal and bilateral hilar adenopathy with the largest pretracheal node measuring 4.1 x 3.1 cm. - MRI of the brain from 01/04/2022 which was negative for metastatic disease. - CTAP from 01/03/2022 which showed isolated left adrenal metastasis with no other evidence of metastatic disease in the abdomen or pelvis. - Pathology of left lung biopsy which shows adenocarcinoma with necrosis.  CK20 positive and CDX2 positive but negative for CK7 indicating colonic primary. 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  HER2 negative.  TMB low.  MSI-stable.  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  Other targetable mutations negative. - FOLFIRI started on 02/22/2022, bevacizumab added with cycle 4  - CT CAP (05/17/2022): Mediastinal and hilar lymph nodes have decreased in size.  Largest perihilar left lower lobe lung mass has decreased in size.  Right upper lobe mass slightly increased in size.  Other nodules are stable.  Left adrenal mass decreased to 1.3 cm from 2.8 cm. - CT scan showed mixed response as it was compared to CT from 12/16/2021.  He did not start chemotherapy until 02/22/2022. - Maintenance 5-FU and bevacizumab from September 2023.   2. Social/family  history: - Lives by himself.  He paints houses for living. - He quit smoking 12 years back and started back again 1 and half year ago and smoked half pack per day.  He quit again about 1 week ago. - Father had cancer, type unknown to the patient.  Brother died of brain tumor.  3.  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  Margins negative.  No lymphovascular or perineural invasion. - Received 3 cycles of XELOX followed by Xeloda for total of 6 months.  Oxaliplatin discontinued during cycle 4 secondary to transaminitis, elevated bilirubin and thrombocytopenia.  However he was also treated for hep C with Harvoni after that.    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 - He missed several follow-up CT scan appointments.  He reported that he was working out of town and forgot about the last appointment. - Reported decreased appetite but is not taking Megace regularly.  When he takes Megace it helps. - Reviewed labs today: Normal LFTs and creatinine.  CBC grossly normal. - Last CEA has gone up to 113 from 99. - UA today shows protein 30. - We will reschedule his CT CAP for the next available, as soon as possible.  He will continue maintenance 5-FU and bevacizumab in the interim.  I will see him back after CT scan.  If there is  progression, we will consider adding irinotecan to the regimen.  2.  Lower rib/epigastric pain: - Continue oxycodone 10 mg every 4 hours as needed.  Pain is well-controlled.  3.  Difficulty falling asleep: - Not requiring trazodone on regular basis.  4.  Hypertension: - Continue amlodipine 10 mg daily.  Blood pressure is 128/90.    No orders of the defined types were placed in this encounter.     I,Katie Daubenspeck,acting as a Neurosurgeon for [name], MD.,have documented all relevant documentation on the behalf of [name], MD,as directed by  [name], MD while in the presence of [name], MD.   I, [name] MD, have reviewed the above documentation for accuracy and completeness, and I agree with the above.   [name], MD   9/4/20244:56 PM  CHIEF COMPLAINT:   Diagnosis: metastatic colon cancer to the lungs and left adrenal gland    Cancer Staging  Cecal cancer [facility]) Staging form: Colon and Rectum, AJCC 8th Edition - Clinical stage from 10/07/2019: Stage IIIB (cT3, cN1, cM0) - Signed by [name], MD on 10/07/2019 - Pathologic stage from 01/23/2022: Stage IVB (rpTX, pN0, pM1b) - Unsigned    Prior Therapy: FOLFIRI 02/22/22 - 08/08/22   Current Therapy:  maintenance 5-FU + bevacizumab    HISTORY OF PRESENT ILLNESS:   Oncology History  Cecal cancer (HCC)  10/07/2019 Initial Diagnosis   Cecal cancer (HCC)   10/07/2019 Cancer Staging   Staging form: Colon and Rectum, AJCC 8th Edition - Clinical stage from 10/07/2019: Stage IIIB (cT3, cN1, cM0) - Signed by [name], MD on 10/07/2019   02/22/2022 - 08/08/2022 Chemotherapy   Patient is on Treatment Plan : COLORECTAL FOLFIRI / BEVACIZUMAB Q14D     02/22/2022 -  Chemotherapy   Patient is on Treatment Plan : COLORECTAL FOLFIRI + Bevacizumab q14d        INTERVAL HISTORY:   Evan [name] is a 67 y.o. male presenting to clinic today for follow up of metastatic colon cancer to the lungs and left adrenal gland. He was last seen by me on 07/04/23.  Today, he states that he is doing well overall. His appetite level is at 20%. His energy level is at 90%.  PAST MEDICAL HISTORY:   Past Medical History: Past Medical History:  Diagnosis Date   Arthritis    Colon cancer (HCC)    colon   Hepatitis C    Hypertension    Port-A-Cath in place 02/15/2022    Surgical History: Past Surgical History:  Procedure  Laterality Date   BIOPSY  03/12/2023   Procedure: BIOPSY;  Surgeon: Lanelle Bal, DO;  Location: [facility];  Service: Endoscopy;;   COLON SURGERY     ESOPHAGEAL BANDING N/A 03/12/2023   Procedure: ESOPHAGEAL BANDING;  Surgeon: Lanelle Bal, DO;  Location: [facility];  Service: Endoscopy;  Laterality: N/A;   ESOPHAGOGASTRODUODENOSCOPY (EGD) WITH PROPOFOL N/A 03/12/2023   Procedure: ESOPHAGOGASTRODUODENOSCOPY (EGD) WITH PROPOFOL;  Surgeon: Lanelle Bal, DO;  Location: [facility];  Service: Endoscopy;  Laterality: N/A;  11:30AM; ASA 3   PORTACATH PLACEMENT Right 02/08/2022   Procedure: INSERTION PORT-A-CATH- RIJ;  Surgeon: Lewie Chamber, DO;  Location: [facility] ORS;  Service: General;  Laterality: Right;   REPLACEMENT TOTAL KNEE Left    SHOULDER ARTHROSCOPY Bilateral    TOTAL HIP ARTHROPLASTY Right 11/09/2020   Procedure: TOTAL HIP ARTHROPLASTY ANTERIOR APPROACH;  Surgeon: Sheral Apley, MD;  Location: [facility];  Service:  Orthopedics;  Laterality: Right;   WRIST SURGERY Left     Social History: Social History   Socioeconomic History   Marital status: Married    Spouse name: Not on file   Number of children: 7   Years of education: Not on file   Highest education level: Not on file  Occupational History   Occupation: employed  Tobacco Use   Smoking status: Former    Current packs/day: 0.00    Average packs/day: 1.5 packs/day for 20.0 years (30.0 ttl pk-yrs)    Types: Cigarettes    Start date: 11/19/1985    Quit date: 11/19/2005    Years since quitting: 17.7   Smokeless tobacco: Never  Vaping Use   Vaping status: Never Used  Substance and Sexual Activity   Alcohol use: Not Currently   Drug use: Never   Sexual activity: Yes  Other Topics Concern   Not on file  Social History Narrative   ** Merged History Encounter **       Separated from wife 11/2021   Social Determinants of Health   Financial Resource Strain: Low Risk  (10/07/2019)   Overall  Financial Resource Strain (CARDIA)    Difficulty of Paying Living Expenses: Not very hard  Food Insecurity: No Food Insecurity (10/07/2019)   Hunger Vital Sign    Worried About Running Out of Food in the Last Year: Never true    Ran Out of Food in the Last Year: Never true  Transportation Needs: No Transportation Needs (10/07/2019)   PRAPARE - Administrator, Civil Service (Medical): No    Lack of Transportation (Non-Medical): No  Physical Activity: Inactive (10/07/2019)   Exercise Vital Sign    Days of Exercise per Week: 0 days    Minutes of Exercise per Session: 0 min  Stress: No Stress Concern Present (10/07/2019)   [employer] of Occupational Health - Occupational Stress Questionnaire    Feeling of Stress : Not at all  Social Connections: Moderately Integrated (10/07/2019)   Social Connection and Isolation Panel [NHANES]    Frequency of Communication with Friends and Family: Once a week    Frequency of Social Gatherings with Friends and Family: Once a week    Attends Religious Services: More than 4 times per year    Active Member of [employer] or Organizations: Yes    Attends Engineer, structural: More than 4 times per year    Marital Status: Married  Catering manager Violence: Not At Risk (10/07/2019)   Humiliation, Afraid, Rape, and Kick questionnaire    Fear of Current or Ex-Partner: No    Emotionally Abused: No    Physically Abused: No    Sexually Abused: No    Family History: Family History  Problem Relation Age of Onset   Heart disease Mother    Dementia Father    Heart disease Sister    Cancer Brother    Hypertension Brother    Hypertension Brother    Healthy Son    Healthy Son    Healthy Son    Healthy Son    Healthy Daughter    Healthy Daughter    Healthy Daughter     Current Medications:  Current Outpatient Medications:    aluminum-magnesium hydroxide-simethicone (MAALOX) 200-200-20 MG/5ML SUSP, Take 30 mLs by mouth 4 (four)  times daily -  before meals and at bedtime., Disp: 1680 mL, Rfl: 2   amLODipine (NORVASC) 10 MG tablet, Take 1 tablet (10 mg total) by mouth  daily., Disp: 90 tablet, Rfl: 3   Bevacizumab (AVASTIN IV), Inject into the vein every 14 (fourteen) days. *start date TBD, Disp: , Rfl:    fluorouracil CALGB 59563 2,400 mg/m2 in sodium chloride 0.9 % 150 mL, Inject 2,400 mg/m2 into the vein over 48 hr., Disp: , Rfl:    FLUOROURACIL IV, Inject into the vein every 14 (fourteen) days., Disp: , Rfl:    Lactulose 20 GM/30ML SOLN, Take 15 mLs (10 g total) by mouth at bedtime. Take 15 ml at bedtime every night to assist with regular bowel movements.  Titrate down if having multiple bowel movements.  If a bowel movement has not occurred in 3 to 4 days or longer, then take 15 ml every 3 hours until a bowel movent has occurred., Disp: 450 mL, Rfl: 5   LEUCOVORIN CALCIUM IV, Inject into the vein every 14 (fourteen) days., Disp: , Rfl:    megestrol (MEGACE) 400 MG/10ML suspension, Take 10 mLs (400 mg total) by mouth 2 (two) times daily., Disp: 480 mL, Rfl: 3   Oxycodone HCl 10 MG TABS, Take 1 tablet (10 mg total) by mouth every 4 (four) hours as needed., Disp: 180 tablet, Rfl: 0   pantoprazole (PROTONIX) 40 MG tablet, Take 1 tablet (40 mg total) by mouth daily., Disp: 30 tablet, Rfl: 11   potassium chloride SA (KLOR-CON M) 20 MEQ tablet, Take 1 tablet (20 mEq total) by mouth daily., Disp: 90 tablet, Rfl: 4 No current facility-administered medications for this visit.  Facility-Administered Medications Ordered in Other Visits:    fluorouracil (ADRUCIL) 5,000 mg in sodium chloride 0.9 % 150 mL chemo infusion, 2,400 mg/m2 (Treatment Plan Recorded), Intravenous, 1 day or 1 dose, [name], MD, Infusion Verify at 08/15/23 1305   Allergies: No Known Allergies  REVIEW OF SYSTEMS:   Review of Systems  Constitutional:  Negative for chills, fatigue and fever.  HENT:   Negative for lump/mass, mouth sores,  nosebleeds, sore throat and trouble swallowing.   Eyes:  Negative for eye problems.  Respiratory:  Positive for shortness of breath. Negative for cough.   Cardiovascular:  Negative for chest pain, leg swelling and palpitations.  Gastrointestinal:  Positive for constipation. Negative for abdominal pain, diarrhea, nausea and vomiting.  Genitourinary:  Negative for bladder incontinence, difficulty urinating, dysuria, frequency, hematuria and nocturia.   Musculoskeletal:  Negative for arthralgias, back pain, flank pain, myalgias and neck pain.  Skin:  Negative for itching and rash.  Neurological:  Negative for dizziness, headaches and numbness.  Hematological:  Does not bruise/bleed easily.  Psychiatric/Behavioral:  Negative for depression, sleep disturbance and suicidal ideas. The patient is not nervous/anxious.   All other systems reviewed and are negative.    VITALS:   There were no vitals taken for this visit.  Wt Readings from Last 3 Encounters:  08/15/23 190 lb 11.2 oz (86.5 kg)  08/01/23 188 lb 8 oz (85.5 kg)  07/18/23 190 lb 4.8 oz (86.3 kg)    There is no height or weight on file to calculate BMI.  Performance status (ECOG): 1 - Symptomatic but completely ambulatory  PHYSICAL EXAM:   Physical Exam Vitals and nursing note reviewed. Exam conducted with a chaperone present.  Constitutional:      Appearance: Normal appearance.  Cardiovascular:     Rate and Rhythm: Normal rate and regular rhythm.     Pulses: Normal pulses.     Heart sounds: Normal heart sounds.  Pulmonary:     Effort: Pulmonary effort is  normal.     Breath sounds: Normal breath sounds.  Abdominal:     Palpations: Abdomen is soft. There is no hepatomegaly, splenomegaly or mass.     Tenderness: There is no abdominal tenderness.  Musculoskeletal:     Right lower leg: No edema.     Left lower leg: No edema.  Lymphadenopathy:     Cervical: No cervical adenopathy.     Right cervical: No superficial, deep or  posterior cervical adenopathy.    Left cervical: No superficial, deep or posterior cervical adenopathy.     Upper Body:     Right upper body: No supraclavicular or axillary adenopathy.     Left upper body: No supraclavicular or axillary adenopathy.  Neurological:     General: No focal deficit present.     Mental Status: He is alert and oriented to person, place, and time.  Psychiatric:        Mood and Affect: Mood normal.        Behavior: Behavior normal.     LABS:      Latest Ref Rng & Units 08/15/2023    8:51 AM 08/01/2023    8:27 AM 07/18/2023    9:45 AM  CBC  WBC 4.0 - 10.5 K/uL 8.6  7.4  6.2   Hemoglobin 13.0 - 17.0 g/dL 09.8  11.9  14.7   Hematocrit 39.0 - 52.0 % 39.9  39.6  36.0   Platelets 150 - 400 K/uL 154  150  123       Latest Ref Rng & Units 08/15/2023    8:51 AM 08/01/2023    8:27 AM 07/18/2023    9:45 AM  CMP  Glucose 70 - 99 mg/dL 829  562  130   BUN 8 - 23 mg/dL 9  15  12    Creatinine 0.61 - 1.24 mg/dL 8.65  7.84  6.96   Sodium 135 - 145 mmol/L 135  134  135   Potassium 3.5 - 5.1 mmol/L 3.6  4.3  3.9   Chloride 98 - 111 mmol/L 103  103  102   CO2 22 - 32 mmol/L 21  22  23    Calcium 8.9 - 10.3 mg/dL 9.1  9.4  8.9   Total Protein 6.5 - 8.1 g/dL 7.5  7.4  6.9   Total Bilirubin 0.3 - 1.2 mg/dL 1.1  1.5  0.6   Alkaline Phos 38 - 126 U/L 37  37  46   AST 15 - 41 U/L 18  22  19    ALT 0 - 44 U/L 10  10  10       Lab Results  Component Value Date   CEA1 113.0 (H) 08/01/2023   /  CEA  Date Value Ref Range Status  08/01/2023 113.0 (H) 0.0 - 4.7 ng/mL Final    Comment:    (NOTE)                             Nonsmokers          <3.9                             Smokers             <5.6 Roche [employer]ostics Electrochemiluminescence Immunoassay (ECLIA) Values obtained with different assay methods or kits cannot be used interchangeably.  Results cannot be interpreted as absolute evidence of the presence or  absence of malignant disease. Performed At: [facility] [address] [place], [place] [id number] Jolene Schimke MD EX:[id number]    No results found for: "PSA1" No results found for: "CAN199" No results found for: "CAN125"  No results found for: "TOTALPROTELP", "ALBUMINELP", "A1GS", "A2GS", "BETS", "BETA2SER", "GAMS", "MSPIKE", "SPEI" Lab Results  Component Value Date   TIBC 406 01/16/2023   TIBC 294 06/06/2022   TIBC 237 (L) 02/22/2022   FERRITIN 150 01/16/2023   FERRITIN 243 06/06/2022   FERRITIN 292 02/22/2022   IRONPCTSAT 18 01/16/2023   IRONPCTSAT 24 06/06/2022   IRONPCTSAT 11 (L) 02/22/2022   Lab Results  Component Value Date   LDH 258 (H) 12/26/2021     STUDIES:   No results found.

## 2023-08-15 ENCOUNTER — Inpatient Hospital Stay: Payer: Medicare Other

## 2023-08-15 ENCOUNTER — Inpatient Hospital Stay: Payer: Medicare Other | Attending: Hematology | Admitting: Hematology

## 2023-08-15 VITALS — BP 157/90 | HR 59 | Temp 98.4°F | Resp 18

## 2023-08-15 DIAGNOSIS — R1013 Epigastric pain: Secondary | ICD-10-CM | POA: Diagnosis not present

## 2023-08-15 DIAGNOSIS — Z5111 Encounter for antineoplastic chemotherapy: Secondary | ICD-10-CM | POA: Insufficient documentation

## 2023-08-15 DIAGNOSIS — Z5112 Encounter for antineoplastic immunotherapy: Secondary | ICD-10-CM | POA: Diagnosis not present

## 2023-08-15 DIAGNOSIS — Z79899 Other long term (current) drug therapy: Secondary | ICD-10-CM | POA: Diagnosis not present

## 2023-08-15 DIAGNOSIS — C180 Malignant neoplasm of cecum: Secondary | ICD-10-CM

## 2023-08-15 DIAGNOSIS — C7802 Secondary malignant neoplasm of left lung: Secondary | ICD-10-CM | POA: Insufficient documentation

## 2023-08-15 DIAGNOSIS — I10 Essential (primary) hypertension: Secondary | ICD-10-CM | POA: Diagnosis not present

## 2023-08-15 DIAGNOSIS — C7801 Secondary malignant neoplasm of right lung: Secondary | ICD-10-CM | POA: Diagnosis not present

## 2023-08-15 DIAGNOSIS — C7972 Secondary malignant neoplasm of left adrenal gland: Secondary | ICD-10-CM | POA: Diagnosis not present

## 2023-08-15 DIAGNOSIS — Z95828 Presence of other vascular implants and grafts: Secondary | ICD-10-CM

## 2023-08-15 DIAGNOSIS — Z87891 Personal history of nicotine dependence: Secondary | ICD-10-CM | POA: Diagnosis not present

## 2023-08-15 LAB — CBC WITH DIFFERENTIAL/PLATELET
Abs Immature Granulocytes: 0.21 10*3/uL — ABNORMAL HIGH (ref 0.00–0.07)
Basophils Absolute: 0.1 10*3/uL (ref 0.0–0.1)
Basophils Relative: 1 %
Eosinophils Absolute: 0.3 10*3/uL (ref 0.0–0.5)
Eosinophils Relative: 4 %
HCT: 39.9 % (ref 39.0–52.0)
Hemoglobin: 13 g/dL (ref 13.0–17.0)
Immature Granulocytes: 3 %
Lymphocytes Relative: 37 %
Lymphs Abs: 3.2 10*3/uL (ref 0.7–4.0)
MCH: 31.3 pg (ref 26.0–34.0)
MCHC: 32.6 g/dL (ref 30.0–36.0)
MCV: 95.9 fL (ref 80.0–100.0)
Monocytes Absolute: 1 10*3/uL (ref 0.1–1.0)
Monocytes Relative: 12 %
Neutro Abs: 3.8 10*3/uL (ref 1.7–7.7)
Neutrophils Relative %: 43 %
Platelets: 154 10*3/uL (ref 150–400)
RBC: 4.16 MIL/uL — ABNORMAL LOW (ref 4.22–5.81)
RDW: 16.7 % — ABNORMAL HIGH (ref 11.5–15.5)
WBC: 8.6 10*3/uL (ref 4.0–10.5)
nRBC: 0 % (ref 0.0–0.2)

## 2023-08-15 LAB — COMPREHENSIVE METABOLIC PANEL
ALT: 10 U/L (ref 0–44)
AST: 18 U/L (ref 15–41)
Albumin: 3.8 g/dL (ref 3.5–5.0)
Alkaline Phosphatase: 37 U/L — ABNORMAL LOW (ref 38–126)
Anion gap: 11 (ref 5–15)
BUN: 9 mg/dL (ref 8–23)
CO2: 21 mmol/L — ABNORMAL LOW (ref 22–32)
Calcium: 9.1 mg/dL (ref 8.9–10.3)
Chloride: 103 mmol/L (ref 98–111)
Creatinine, Ser: 1.1 mg/dL (ref 0.61–1.24)
GFR, Estimated: >60 mL/min (ref >60)
Glucose, Bld: 134 mg/dL — ABNORMAL HIGH (ref 70–99)
Potassium: 3.6 mmol/L (ref 3.5–5.1)
Sodium: 135 mmol/L (ref 135–145)
Total Bilirubin: 1.1 mg/dL (ref 0.3–1.2)
Total Protein: 7.5 g/dL (ref 6.5–8.1)

## 2023-08-15 LAB — URINALYSIS, DIPSTICK ONLY
Bilirubin Urine: NEGATIVE
Glucose, UA: NEGATIVE mg/dL
Ketones, ur: NEGATIVE mg/dL
Leukocytes,Ua: NEGATIVE
Nitrite: NEGATIVE
Protein, ur: 30 mg/dL — AB
Specific Gravity, Urine: 1.017 (ref 1.005–1.030)
pH: 5 (ref 5.0–8.0)

## 2023-08-15 LAB — MAGNESIUM: Magnesium: 1.9 mg/dL (ref 1.7–2.4)

## 2023-08-15 MED ORDER — SODIUM CHLORIDE 0.9 % IV SOLN
5.0000 mg/kg | Freq: Once | INTRAVENOUS | Status: AC
  Administered 2023-08-15: 400 mg via INTRAVENOUS
  Filled 2023-08-15: qty 16

## 2023-08-15 MED ORDER — PALONOSETRON HCL INJECTION 0.25 MG/5ML
0.2500 mg | Freq: Once | INTRAVENOUS | Status: AC
  Administered 2023-08-15: 0.25 mg via INTRAVENOUS
  Filled 2023-08-15: qty 5

## 2023-08-15 MED ORDER — SODIUM CHLORIDE 0.9% FLUSH
10.0000 mL | Freq: Once | INTRAVENOUS | Status: AC
  Administered 2023-08-15: 10 mL via INTRAVENOUS

## 2023-08-15 MED ORDER — SODIUM CHLORIDE 0.9 % IV SOLN
2400.0000 mg/m2 | INTRAVENOUS | Status: DC
  Administered 2023-08-15: 5000 mg via INTRAVENOUS
  Filled 2023-08-15: qty 100

## 2023-08-15 MED ORDER — SODIUM CHLORIDE 0.9 % IV SOLN
10.0000 mg | Freq: Once | INTRAVENOUS | Status: AC
  Administered 2023-08-15: 10 mg via INTRAVENOUS
  Filled 2023-08-15: qty 10

## 2023-08-15 MED ORDER — SODIUM CHLORIDE 0.9 % IV SOLN: Freq: Once | INTRAVENOUS | Status: AC

## 2023-08-15 MED ORDER — SODIUM CHLORIDE 0.9 % IV SOLN
400.0000 mg/m2 | Freq: Once | INTRAVENOUS | Status: AC
  Administered 2023-08-15: 780 mg via INTRAVENOUS
  Filled 2023-08-15: qty 39

## 2023-08-15 MED ORDER — FLUOROURACIL CHEMO INJECTION 2.5 GM/50ML
400.0000 mg/m2 | Freq: Once | INTRAVENOUS | Status: AC
  Administered 2023-08-15: 800 mg via INTRAVENOUS
  Filled 2023-08-15: qty 16

## 2023-08-15 NOTE — Patient Instructions (Signed)
[facility]  Discharge Instructions: Thank you for choosing [facility] to provide your oncology and hematology care.  If you have a lab appointment with the Cancer Center - please note that after April 8th, 2024, all labs will be drawn in the cancer center.  You do not have to check in or register with the main entrance as you have in the past but will complete your check-in in the cancer center.  Wear comfortable clothing and clothing appropriate for easy access to any Portacath or PICC line.   We strive to give you quality time with your provider. You may need to reschedule your appointment if you arrive late (15 or more minutes).  Arriving late affects you and other patients whose appointments are after yours.  Also, if you miss three or more appointments without notifying the office, you may be dismissed from the clinic at the provider's discretion.      For prescription refill requests, have your pharmacy contact our office and allow 72 hours for refills to be completed.    Today you received the following chemotherapy and/or immunotherapy agents MVASI/Leucovorin and 5FU   To help prevent nausea and vomiting after your treatment, we encourage you to take your nausea medication as directed.  BELOW ARE SYMPTOMS THAT SHOULD BE REPORTED IMMEDIATELY: *FEVER GREATER THAN 100.4 F (38 C) OR HIGHER *CHILLS OR SWEATING *NAUSEA AND VOMITING THAT IS NOT CONTROLLED WITH YOUR NAUSEA MEDICATION *UNUSUAL SHORTNESS OF BREATH *UNUSUAL BRUISING OR BLEEDING *URINARY PROBLEMS (pain or burning when urinating, or frequent urination) *BOWEL PROBLEMS (unusual diarrhea, constipation, pain near the anus) TENDERNESS IN MOUTH AND THROAT WITH OR WITHOUT PRESENCE OF ULCERS (sore throat, sores in mouth, or a toothache) UNUSUAL RASH, SWELLING OR PAIN  UNUSUAL VAGINAL DISCHARGE OR ITCHING   Items with * indicate a potential emergency and should be followed up as soon as possible or  go to the Emergency Department if any problems should occur.  Please show the CHEMOTHERAPY ALERT CARD or IMMUNOTHERAPY ALERT CARD at check-in to the Emergency Department and triage nurse.  Should you have questions after your visit or need to cancel or reschedule your appointment, please contact [facility] CENTER AT [facility] [phone]  and follow the prompts.  Office hours are 8:00 a.m. to 4:30 p.m. Monday - Friday. Please note that voicemails left after 4:00 p.m. may not be returned until the following business day.  We are closed weekends and major holidays. You have access to a nurse at all times for urgent questions. Please call the main number to the clinic [phone] and follow the prompts.  For any non-urgent questions, you may also contact your provider using MyChart. We now offer e-Visits for anyone 82 and older to request care online for non-urgent symptoms. For details visit [email or web address].   Also download the MyChart app! Go to the app store, search "MyChart", open the app, select [place], and log in with your MyChart username and password.

## 2023-08-15 NOTE — Progress Notes (Signed)
Patient presents today for MVASI/Leucovorin/Fluorouracil infusion. Patient is in satisfactory condition with no new complaints voiced.  Vital signs are stable.  Labs reviewed by Dr. Ellin Saba during the office visit and all labs are within treatment parameters.  We will proceed with treatment per MD orders.   Treatment given today per MD orders. Tolerated infusion without adverse affects. Vital signs stable. No complaints at this time. Discharged from clinic ambulatory in stable condition. Alert and oriented x 3. F/U with [facility] as scheduled. 5FU ambulatory pump infusing.

## 2023-08-15 NOTE — Progress Notes (Signed)
Patient has been examined by Dr. Katragadda. Vital signs and labs have been reviewed by MD - ANC, Creatinine, LFTs, hemoglobin, and platelets are within treatment parameters per M.D. - pt may proceed with treatment.  Primary RN and pharmacy notified.  

## 2023-08-15 NOTE — Patient Instructions (Signed)
[facility] at [facility] Discharge Instructions   You were seen and examined today by Dr. Ellin Saba.  He reviewed the results of your lab work which are normal/stable.   We will proceed with your treatment today.   It is very important that you complete your CT scan as your tumor marker is continuing to rise. We may need to add another chemotherapy drug (irinotecan) back to your treatment plan if the cancer is growing or spreading.   Return as scheduled.    Thank you for choosing [facility] at [facility] to provide your oncology and hematology care.  To afford each patient quality time with our provider, please arrive at least 15 minutes before your scheduled appointment time.   If you have a lab appointment with the Cancer Center please come in thru the Main Entrance and check in at the main information desk.  You need to re-schedule your appointment should you arrive 10 or more minutes late.  We strive to give you quality time with our providers, and arriving late affects you and other patients whose appointments are after yours.  Also, if you no show three or more times for appointments you may be dismissed from the clinic at the providers discretion.     Again, thank you for choosing [facility].  Our hope is that these requests will decrease the amount of time that you wait before being seen by our physicians.       _____________________________________________________________  Should you have questions after your visit to [facility], please contact our office at [phone] and follow the prompts.  Our office hours are 8:00 a.m. and 4:30 p.m. Monday - Friday.  Please note that voicemails left after 4:00 p.m. may not be returned until the following business day.  We are closed weekends and major holidays.  You do have access to a nurse 24-7, just call the main number to the clinic [phone] and do not press  any options, hold on the line and a nurse will answer the phone.    For prescription refill requests, have your pharmacy contact our office and allow 72 hours.    Due to Covid, you will need to wear a mask upon entering the hospital. If you do not have a mask, a mask will be given to you at the Main Entrance upon arrival. For doctor visits, patients may have 1 support person age 9 or older with them. For treatment visits, patients can not have anyone with them due to social distancing guidelines and our immunocompromised population.

## 2023-08-17 ENCOUNTER — Inpatient Hospital Stay: Payer: Medicare Other

## 2023-08-17 VITALS — BP 146/101 | HR 85 | Temp 97.7°F | Resp 18

## 2023-08-17 DIAGNOSIS — C180 Malignant neoplasm of cecum: Secondary | ICD-10-CM | POA: Diagnosis not present

## 2023-08-17 DIAGNOSIS — R1013 Epigastric pain: Secondary | ICD-10-CM | POA: Diagnosis not present

## 2023-08-17 DIAGNOSIS — Z5112 Encounter for antineoplastic immunotherapy: Secondary | ICD-10-CM | POA: Diagnosis not present

## 2023-08-17 DIAGNOSIS — I10 Essential (primary) hypertension: Secondary | ICD-10-CM | POA: Diagnosis not present

## 2023-08-17 DIAGNOSIS — C7802 Secondary malignant neoplasm of left lung: Secondary | ICD-10-CM | POA: Diagnosis not present

## 2023-08-17 DIAGNOSIS — Z79899 Other long term (current) drug therapy: Secondary | ICD-10-CM | POA: Diagnosis not present

## 2023-08-17 DIAGNOSIS — Z87891 Personal history of nicotine dependence: Secondary | ICD-10-CM | POA: Diagnosis not present

## 2023-08-17 DIAGNOSIS — Z95828 Presence of other vascular implants and grafts: Secondary | ICD-10-CM

## 2023-08-17 DIAGNOSIS — Z5111 Encounter for antineoplastic chemotherapy: Secondary | ICD-10-CM | POA: Diagnosis not present

## 2023-08-17 DIAGNOSIS — C7801 Secondary malignant neoplasm of right lung: Secondary | ICD-10-CM | POA: Diagnosis not present

## 2023-08-17 DIAGNOSIS — C7972 Secondary malignant neoplasm of left adrenal gland: Secondary | ICD-10-CM | POA: Diagnosis not present

## 2023-08-17 MED ORDER — SODIUM CHLORIDE 0.9% FLUSH
10.0000 mL | INTRAVENOUS | Status: DC | PRN
  Administered 2023-08-17: 10 mL

## 2023-08-17 MED ORDER — HEPARIN SOD (PORK) LOCK FLUSH 100 UNIT/ML IV SOLN
500.0000 [IU] | Freq: Once | INTRAVENOUS | Status: AC | PRN
  Administered 2023-08-17: 500 [IU]

## 2023-08-17 NOTE — Progress Notes (Signed)
Patient presents today for 5FU chemotherapy pump disconnection per provider's order. Pt's BP noted to be 146/101 today. Pt stated he did not take his BP meds today. Patient stated he would rather go home and take his BP medication instead of taking it in the clinic. Pt educated on the importance of taking his BP meds daily.  Port flushed easily with 10 mL of normal saline and 5 mL of heparin good blood return noted, needle removed intact and no swelling or bruising noted at the site.  Treatment given today per MD orders. Tolerated infusion without adverse affects. Vital signs stable. No complaints at this time. Discharged from clinic ambulatory in stable condition. Alert and oriented x 3. F/U with [facility] as scheduled.

## 2023-08-17 NOTE — Patient Instructions (Signed)
[facility]  Discharge Instructions: Thank you for choosing [facility] to provide your oncology and hematology care.  If you have a lab appointment with the Cancer Center - please note that after April 8th, 2024, all labs will be drawn in the cancer center.  You do not have to check in or register with the main entrance as you have in the past but will complete your check-in in the cancer center.  Wear comfortable clothing and clothing appropriate for easy access to any Portacath or PICC line.   We strive to give you quality time with your provider. You may need to reschedule your appointment if you arrive late (15 or more minutes).  Arriving late affects you and other patients whose appointments are after yours.  Also, if you miss three or more appointments without notifying the office, you may be dismissed from the clinic at the provider's discretion.      For prescription refill requests, have your pharmacy contact our office and allow 72 hours for refills to be completed.    Today you received 5FU chemotherapy pump disconnection   BELOW ARE SYMPTOMS THAT SHOULD BE REPORTED IMMEDIATELY: *FEVER GREATER THAN 100.4 F (38 C) OR HIGHER *CHILLS OR SWEATING *NAUSEA AND VOMITING THAT IS NOT CONTROLLED WITH YOUR NAUSEA MEDICATION *UNUSUAL SHORTNESS OF BREATH *UNUSUAL BRUISING OR BLEEDING *URINARY PROBLEMS (pain or burning when urinating, or frequent urination) *BOWEL PROBLEMS (unusual diarrhea, constipation, pain near the anus) TENDERNESS IN MOUTH AND THROAT WITH OR WITHOUT PRESENCE OF ULCERS (sore throat, sores in mouth, or a toothache) UNUSUAL RASH, SWELLING OR PAIN  UNUSUAL VAGINAL DISCHARGE OR ITCHING   Items with * indicate a potential emergency and should be followed up as soon as possible or go to the Emergency Department if any problems should occur.  Please show the CHEMOTHERAPY ALERT CARD or IMMUNOTHERAPY ALERT CARD at check-in to the Emergency  Department and triage nurse.  Should you have questions after your visit or need to cancel or reschedule your appointment, please contact [facility] CENTER AT [facility] [phone]  and follow the prompts.  Office hours are 8:00 a.m. to 4:30 p.m. Monday - Friday. Please note that voicemails left after 4:00 p.m. may not be returned until the following business day.  We are closed weekends and major holidays. You have access to a nurse at all times for urgent questions. Please call the main number to the clinic [phone] and follow the prompts.  For any non-urgent questions, you may also contact your provider using MyChart. We now offer e-Visits for anyone 32 and older to request care online for non-urgent symptoms. For details visit [email or web address].   Also download the MyChart app! Go to the app store, search "MyChart", open the app, select [place], and log in with your MyChart username and password.

## 2023-08-29 ENCOUNTER — Other Ambulatory Visit: Payer: Self-pay

## 2023-08-29 ENCOUNTER — Inpatient Hospital Stay: Payer: Medicare Other

## 2023-08-29 VITALS — BP 152/87 | HR 64 | Temp 97.7°F | Resp 18

## 2023-08-29 DIAGNOSIS — Z79899 Other long term (current) drug therapy: Secondary | ICD-10-CM | POA: Diagnosis not present

## 2023-08-29 DIAGNOSIS — C7972 Secondary malignant neoplasm of left adrenal gland: Secondary | ICD-10-CM | POA: Diagnosis not present

## 2023-08-29 DIAGNOSIS — C7802 Secondary malignant neoplasm of left lung: Secondary | ICD-10-CM | POA: Diagnosis not present

## 2023-08-29 DIAGNOSIS — Z5112 Encounter for antineoplastic immunotherapy: Secondary | ICD-10-CM | POA: Diagnosis not present

## 2023-08-29 DIAGNOSIS — C7801 Secondary malignant neoplasm of right lung: Secondary | ICD-10-CM | POA: Diagnosis not present

## 2023-08-29 DIAGNOSIS — C180 Malignant neoplasm of cecum: Secondary | ICD-10-CM

## 2023-08-29 DIAGNOSIS — Z87891 Personal history of nicotine dependence: Secondary | ICD-10-CM | POA: Diagnosis not present

## 2023-08-29 DIAGNOSIS — R1013 Epigastric pain: Secondary | ICD-10-CM | POA: Diagnosis not present

## 2023-08-29 DIAGNOSIS — Z95828 Presence of other vascular implants and grafts: Secondary | ICD-10-CM

## 2023-08-29 DIAGNOSIS — I10 Essential (primary) hypertension: Secondary | ICD-10-CM | POA: Diagnosis not present

## 2023-08-29 DIAGNOSIS — Z5111 Encounter for antineoplastic chemotherapy: Secondary | ICD-10-CM | POA: Diagnosis not present

## 2023-08-29 LAB — CBC WITH DIFFERENTIAL/PLATELET
Abs Immature Granulocytes: 0.06 10*3/uL (ref 0.00–0.07)
Basophils Absolute: 0.1 10*3/uL (ref 0.0–0.1)
Basophils Relative: 1 %
Eosinophils Absolute: 0.3 10*3/uL (ref 0.0–0.5)
Eosinophils Relative: 4 %
HCT: 35.8 % — ABNORMAL LOW (ref 39.0–52.0)
Hemoglobin: 11.9 g/dL — ABNORMAL LOW (ref 13.0–17.0)
Immature Granulocytes: 1 %
Lymphocytes Relative: 42 %
Lymphs Abs: 2.8 10*3/uL (ref 0.7–4.0)
MCH: 32.2 pg (ref 26.0–34.0)
MCHC: 33.2 g/dL (ref 30.0–36.0)
MCV: 96.8 fL (ref 80.0–100.0)
Monocytes Absolute: 0.9 10*3/uL (ref 0.1–1.0)
Monocytes Relative: 13 %
Neutro Abs: 2.6 10*3/uL (ref 1.7–7.7)
Neutrophils Relative %: 39 %
Platelets: 158 10*3/uL (ref 150–400)
RBC: 3.7 MIL/uL — ABNORMAL LOW (ref 4.22–5.81)
RDW: 17.3 % — ABNORMAL HIGH (ref 11.5–15.5)
WBC: 6.7 10*3/uL (ref 4.0–10.5)
nRBC: 0 % (ref 0.0–0.2)

## 2023-08-29 LAB — COMPREHENSIVE METABOLIC PANEL WITH GFR
ALT: 9 U/L (ref 0–44)
AST: 18 U/L (ref 15–41)
Albumin: 3.6 g/dL (ref 3.5–5.0)
Alkaline Phosphatase: 44 U/L (ref 38–126)
Anion gap: 12 (ref 5–15)
BUN: 7 mg/dL — ABNORMAL LOW (ref 8–23)
CO2: 20 mmol/L — ABNORMAL LOW (ref 22–32)
Calcium: 8.9 mg/dL (ref 8.9–10.3)
Chloride: 103 mmol/L (ref 98–111)
Creatinine, Ser: 1.06 mg/dL (ref 0.61–1.24)
GFR, Estimated: >60 mL/min (ref >60)
Glucose, Bld: 89 mg/dL (ref 70–99)
Potassium: 3.5 mmol/L (ref 3.5–5.1)
Sodium: 135 mmol/L (ref 135–145)
Total Bilirubin: 0.7 mg/dL (ref 0.3–1.2)
Total Protein: 6.9 g/dL (ref 6.5–8.1)

## 2023-08-29 LAB — MAGNESIUM: Magnesium: 1.8 mg/dL (ref 1.7–2.4)

## 2023-08-29 MED ORDER — SODIUM CHLORIDE 0.9 % IV SOLN
10.0000 mg | Freq: Once | INTRAVENOUS | Status: AC
  Administered 2023-08-29: 10 mg via INTRAVENOUS
  Filled 2023-08-29: qty 10

## 2023-08-29 MED ORDER — SODIUM CHLORIDE 0.9 % IV SOLN
400.0000 mg/m2 | Freq: Once | INTRAVENOUS | Status: AC
  Administered 2023-08-29: 780 mg via INTRAVENOUS
  Filled 2023-08-29: qty 39

## 2023-08-29 MED ORDER — SODIUM CHLORIDE 0.9% FLUSH
10.0000 mL | Freq: Once | INTRAVENOUS | Status: AC
  Administered 2023-08-29: 10 mL via INTRAVENOUS

## 2023-08-29 MED ORDER — FLUOROURACIL CHEMO INJECTION 2.5 GM/50ML
400.0000 mg/m2 | Freq: Once | INTRAVENOUS | Status: AC
  Administered 2023-08-29: 800 mg via INTRAVENOUS
  Filled 2023-08-29: qty 16

## 2023-08-29 MED ORDER — SODIUM CHLORIDE 0.9 % IV SOLN
2400.0000 mg/m2 | INTRAVENOUS | Status: DC
  Administered 2023-08-29: 5000 mg via INTRAVENOUS
  Filled 2023-08-29: qty 100

## 2023-08-29 MED ORDER — SODIUM CHLORIDE 0.9% FLUSH: 10.0000 mL | INTRAVENOUS | Status: DC | PRN

## 2023-08-29 MED ORDER — SODIUM CHLORIDE 0.9 % IV SOLN
5.0000 mg/kg | Freq: Once | INTRAVENOUS | Status: AC
  Administered 2023-08-29: 400 mg via INTRAVENOUS
  Filled 2023-08-29: qty 16

## 2023-08-29 MED ORDER — SODIUM CHLORIDE 0.9 % IV SOLN: Freq: Once | INTRAVENOUS | Status: AC

## 2023-08-29 MED ORDER — OXYCODONE HCL 10 MG PO TABS: 10.0000 mg | ORAL_TABLET | ORAL | 0 refills | Status: DC | PRN

## 2023-08-29 MED ORDER — PALONOSETRON HCL INJECTION 0.25 MG/5ML
0.2500 mg | Freq: Once | INTRAVENOUS | Status: AC
  Administered 2023-08-29: 0.25 mg via INTRAVENOUS
  Filled 2023-08-29: qty 5

## 2023-08-29 NOTE — Patient Instructions (Signed)
[facility]  Discharge Instructions: Thank you for choosing [facility] to provide your oncology and hematology care.  If you have a lab appointment with the Cancer Center - please note that after April 8th, 2024, all labs will be drawn in the cancer center.  You do not have to check in or register with the main entrance as you have in the past but will complete your check-in in the cancer center.  Wear comfortable clothing and clothing appropriate for easy access to any Portacath or PICC line.   We strive to give you quality time with your provider. You may need to reschedule your appointment if you arrive late (15 or more minutes).  Arriving late affects you and other patients whose appointments are after yours.  Also, if you miss three or more appointments without notifying the office, you may be dismissed from the clinic at the provider's discretion.      For prescription refill requests, have your pharmacy contact our office and allow 72 hours for refills to be completed.    Today you received the following chemotherapy and/or immunotherapy agents avastin, leucovorin and adruicil.       To help prevent nausea and vomiting after your treatment, we encourage you to take your nausea medication as directed.  BELOW ARE SYMPTOMS THAT SHOULD BE REPORTED IMMEDIATELY: *FEVER GREATER THAN 100.4 F (38 C) OR HIGHER *CHILLS OR SWEATING *NAUSEA AND VOMITING THAT IS NOT CONTROLLED WITH YOUR NAUSEA MEDICATION *UNUSUAL SHORTNESS OF BREATH *UNUSUAL BRUISING OR BLEEDING *URINARY PROBLEMS (pain or burning when urinating, or frequent urination) *BOWEL PROBLEMS (unusual diarrhea, constipation, pain near the anus) TENDERNESS IN MOUTH AND THROAT WITH OR WITHOUT PRESENCE OF ULCERS (sore throat, sores in mouth, or a toothache) UNUSUAL RASH, SWELLING OR PAIN  UNUSUAL VAGINAL DISCHARGE OR ITCHING   Items with * indicate a potential emergency and should be followed up as soon  as possible or go to the Emergency Department if any problems should occur.  Please show the CHEMOTHERAPY ALERT CARD or IMMUNOTHERAPY ALERT CARD at check-in to the Emergency Department and triage nurse.  Should you have questions after your visit or need to cancel or reschedule your appointment, please contact [facility] AT [facility] [phone]  and follow the prompts.  Office hours are 8:00 a.m. to 4:30 p.m. Monday - Friday. Please note that voicemails left after 4:00 p.m. may not be returned until the following business day.  We are closed weekends and major holidays. You have access to a nurse at all times for urgent questions. Please call the main number to the clinic [phone] and follow the prompts.  For any non-urgent questions, you may also contact your provider using MyChart. We now offer e-Visits for anyone 60 and older to request care online for non-urgent symptoms. For details visit [email or web address].   Also download the MyChart app! Go to the app store, search "MyChart", open the app, select [place], and log in with your MyChart username and password.

## 2023-08-29 NOTE — Progress Notes (Signed)
Patient tolerated chemotherapy with no complaints voiced.  Side effects with management reviewed with understanding verbalized.  Port site clean and dry with no bruising or swelling noted at site.  Good blood return noted before and after administration of chemotherapy.  Chemo pump connected with no alarms noted.  Patient left in satisfactory condition with VSS and no s/s of distress noted.

## 2023-08-30 LAB — CEA: CEA: 124 ng/mL — ABNORMAL HIGH (ref 0.0–4.7)

## 2023-08-31 ENCOUNTER — Inpatient Hospital Stay: Payer: Medicare Other

## 2023-08-31 VITALS — BP 139/89 | HR 77 | Temp 97.8°F | Resp 18

## 2023-08-31 DIAGNOSIS — Z87891 Personal history of nicotine dependence: Secondary | ICD-10-CM | POA: Diagnosis not present

## 2023-08-31 DIAGNOSIS — R1013 Epigastric pain: Secondary | ICD-10-CM | POA: Diagnosis not present

## 2023-08-31 DIAGNOSIS — Z95828 Presence of other vascular implants and grafts: Secondary | ICD-10-CM

## 2023-08-31 DIAGNOSIS — C180 Malignant neoplasm of cecum: Secondary | ICD-10-CM | POA: Diagnosis not present

## 2023-08-31 DIAGNOSIS — Z5112 Encounter for antineoplastic immunotherapy: Secondary | ICD-10-CM | POA: Diagnosis not present

## 2023-08-31 DIAGNOSIS — C7801 Secondary malignant neoplasm of right lung: Secondary | ICD-10-CM | POA: Diagnosis not present

## 2023-08-31 DIAGNOSIS — Z5111 Encounter for antineoplastic chemotherapy: Secondary | ICD-10-CM | POA: Diagnosis not present

## 2023-08-31 DIAGNOSIS — I10 Essential (primary) hypertension: Secondary | ICD-10-CM | POA: Diagnosis not present

## 2023-08-31 DIAGNOSIS — Z79899 Other long term (current) drug therapy: Secondary | ICD-10-CM | POA: Diagnosis not present

## 2023-08-31 DIAGNOSIS — C7802 Secondary malignant neoplasm of left lung: Secondary | ICD-10-CM | POA: Diagnosis not present

## 2023-08-31 DIAGNOSIS — C7972 Secondary malignant neoplasm of left adrenal gland: Secondary | ICD-10-CM | POA: Diagnosis not present

## 2023-08-31 MED ORDER — SODIUM CHLORIDE 0.9% FLUSH
10.0000 mL | INTRAVENOUS | Status: DC | PRN
  Administered 2023-08-31: 10 mL

## 2023-08-31 MED ORDER — HEPARIN SOD (PORK) LOCK FLUSH 100 UNIT/ML IV SOLN
500.0000 [IU] | Freq: Once | INTRAVENOUS | Status: AC | PRN
  Administered 2023-08-31: 500 [IU]

## 2023-08-31 NOTE — Patient Instructions (Signed)

## 2023-08-31 NOTE — Progress Notes (Signed)
Patients port flushed without difficulty.  Good blood return noted with no bruising or swelling noted at site.  Band aid applied. Home infusion 5FU pump disconnected with no issues.  VSS with discharge and left in satisfactory condition with no s/s of distress noted.   

## 2023-09-04 ENCOUNTER — Emergency Department
Admit: 2023-09-04 | Discharge: 2023-09-05 | Disposition: A | Discharge status: Home / Self Care | Payer: MEDICARE | Attending: Family

## 2023-09-04 ENCOUNTER — Ambulatory Visit
Admit: 2023-09-04 | Discharge: 2023-09-05 | Disposition: A | Discharge status: Home / Self Care | Payer: MEDICARE | Attending: Family

## 2023-09-04 DIAGNOSIS — R6883 Chills (without fever): Secondary | ICD-10-CM | POA: Diagnosis not present

## 2023-09-04 DIAGNOSIS — R7989 Other specified abnormal findings of blood chemistry: Secondary | ICD-10-CM | POA: Diagnosis not present

## 2023-09-04 DIAGNOSIS — Z79899 Other long term (current) drug therapy: Secondary | ICD-10-CM | POA: Diagnosis not present

## 2023-09-04 DIAGNOSIS — R918 Other nonspecific abnormal finding of lung field: Secondary | ICD-10-CM | POA: Diagnosis not present

## 2023-09-04 DIAGNOSIS — R079 Chest pain, unspecified: Secondary | ICD-10-CM | POA: Diagnosis not present

## 2023-09-04 DIAGNOSIS — C189 Malignant neoplasm of colon, unspecified: Secondary | ICD-10-CM | POA: Diagnosis not present

## 2023-09-04 DIAGNOSIS — G893 Neoplasm related pain (acute) (chronic): Secondary | ICD-10-CM | POA: Diagnosis not present

## 2023-09-04 DIAGNOSIS — R590 Localized enlarged lymph nodes: Secondary | ICD-10-CM | POA: Diagnosis not present

## 2023-09-04 DIAGNOSIS — J9811 Atelectasis: Secondary | ICD-10-CM | POA: Diagnosis not present

## 2023-09-04 DIAGNOSIS — R071 Chest pain on breathing: Secondary | ICD-10-CM | POA: Diagnosis not present

## 2023-09-04 DIAGNOSIS — Z85038 Personal history of other malignant neoplasm of large intestine: Secondary | ICD-10-CM | POA: Diagnosis not present

## 2023-09-04 MED ORDER — OXYCODONE-ACETAMINOPHEN 5 MG-325 MG TABLET
ORAL | 0 refills | 2 days | Status: CP | PRN
Start: 2023-09-04 — End: 2023-09-04

## 2023-09-05 ENCOUNTER — Encounter (HOSPITAL_COMMUNITY): Payer: Self-pay | Admitting: Radiology

## 2023-09-05 ENCOUNTER — Ambulatory Visit (HOSPITAL_COMMUNITY)
Admission: RE | Admit: 2023-09-05 | Discharge: 2023-09-05 | Disposition: A | Discharge status: Home / Self Care | Payer: Medicare Other | Source: Ambulatory Visit | Attending: Hematology | Admitting: Hematology

## 2023-09-05 DIAGNOSIS — C180 Malignant neoplasm of cecum: Secondary | ICD-10-CM | POA: Diagnosis not present

## 2023-09-05 DIAGNOSIS — C189 Malignant neoplasm of colon, unspecified: Secondary | ICD-10-CM | POA: Diagnosis not present

## 2023-09-05 DIAGNOSIS — C782 Secondary malignant neoplasm of pleura: Secondary | ICD-10-CM | POA: Diagnosis not present

## 2023-09-05 DIAGNOSIS — I700 Atherosclerosis of aorta: Secondary | ICD-10-CM | POA: Diagnosis not present

## 2023-09-05 MED ORDER — IOHEXOL 300 MG/ML  SOLN
100.0000 mL | Freq: Once | INTRAMUSCULAR | Status: AC | PRN
  Administered 2023-09-05: 100 mL via INTRAVENOUS

## 2023-09-11 NOTE — Progress Notes (Signed)
[facility] [address], [place] [address]    Clinic Day:  09/12/2023  Referring physician: [name], PA  Patient Care Team: [name], [name] as PCP - General (Physician Assistant) [name], MD as Medical Oncologist (Medical Oncology) Therese Sarah, RN as Oncology Nurse Navigator (Medical Oncology)   ASSESSMENT & PLAN:   Assessment: 1. Left lung mass with multiple lung nodules: - Presentation with dry cough for 6 months. - 30 pound weight loss in the last couple of years, weight stable over the last 6 months. - CT chest with contrast on 12/16/2021 showed bulky left hilar mass measuring 8.1 x 7.5 cm.  Numerous bilateral lung nodules of varying sizes.  Left adrenal nodule measuring 2.8 x 2.2 cm.  Mediastinal and bilateral hilar adenopathy with the largest pretracheal node measuring 4.1 x 3.1 cm. - MRI of the brain from 01/04/2022 which was negative for metastatic disease. - CTAP from 01/03/2022 which showed isolated left adrenal metastasis with no other evidence of metastatic disease in the abdomen or pelvis. - Pathology of left lung biopsy which shows adenocarcinoma with necrosis.  CK20 positive and CDX2 positive but negative for CK7 indicating colonic primary. 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  HER2 negative.  TMB low.  MSI-stable.  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  Other targetable mutations negative. - FOLFIRI started on 02/22/2022, bevacizumab added with cycle 4  - CT CAP (05/17/2022): Mediastinal and hilar lymph nodes have decreased in size.  Largest perihilar left lower lobe lung mass has decreased in size.  Right upper lobe mass slightly increased in size.  Other nodules are stable.  Left adrenal mass decreased to 1.3 cm from 2.8 cm. - CT scan showed mixed response as it was compared to CT from 12/16/2021.  He did not start chemotherapy until 02/22/2022. - Maintenance 5-FU and bevacizumab from September 2023 through 08/29/2023 with  progression - FOLFIRI and bevacizumab started on 09/12/2023   2. Social/family history: - Lives by himself.  He paints houses for living. - He quit smoking 12 years back and started back again 1 and half year ago and smoked half pack per day.  He quit again about 1 week ago. - Father had cancer, type unknown to the patient.  Brother died of brain tumor.  3.  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  Margins negative.  No lymphovascular or perineural invasion. - Received 3 cycles of XELOX followed by Xeloda for total of 6 months.  Oxaliplatin discontinued during cycle 4 secondary to transaminitis, elevated bilirubin and thrombocytopenia.  However he was also treated for hep C with Harvoni after that.    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  He finally had CT CAP done. - I have reviewed CT CAP from 09/05/2023: Increased size of dominant left lower lobe lung nodule with new central occlusion of the superior left upper lobe segmental bronchus.  Additional bilateral irregular solid pulmonary nodules are stable or slightly increased in size.  Increased size of right hilar lymph node.  Stable pleural metastatic disease.  Stable left adrenal gland nodule. - His last CEA has increased to 124.  CBC was grossly normal.  LFTs are normal. - I have recommended adding irinotecan to the maintenance 5-FU and bevacizumab.  We discussed side  effects in detail.  He will proceed with cycle 1 of FOLFIRI and bevacizumab today. - RTC 4 weeks for follow-up.  2.  Lower rib/epigastric pain: - Continue oxycodone 10 mg every 4 hours as needed.  3.  Difficulty falling asleep: - Not requiring trazodone on regular basis.  4.  Hypertension: - Continue amlodipine 10 mg daily.  Blood pressure today is 156/90.    Orders Placed This Encounter   Procedures   Magnesium    Standing Status:   Future    Standing Expiration Date:   09/25/2024   CBC with Differential    Standing Status:   Future    Standing Expiration Date:   09/25/2024   Comprehensive metabolic panel    Standing Status:   Future    Standing Expiration Date:   09/25/2024   Urinalysis, dipstick only    Standing Status:   Future    Standing Expiration Date:   09/25/2024   Magnesium    Standing Status:   Future    Standing Expiration Date:   10/09/2024   CBC with Differential    Standing Status:   Future    Standing Expiration Date:   10/09/2024   Comprehensive metabolic panel    Standing Status:   Future    Standing Expiration Date:   10/09/2024   Urinalysis, dipstick only    Standing Status:   Future    Standing Expiration Date:   10/09/2024   Magnesium    Standing Status:   Future    Standing Expiration Date:   10/23/2024   CBC with Differential    Standing Status:   Future    Standing Expiration Date:   10/23/2024   Comprehensive metabolic panel    Standing Status:   Future    Standing Expiration Date:   10/23/2024   Urinalysis, dipstick only    Standing Status:   Future    Standing Expiration Date:   10/23/2024   Magnesium    Standing Status:   Future    Standing Expiration Date:   11/06/2024   CBC with Differential    Standing Status:   Future    Standing Expiration Date:   11/06/2024   Comprehensive metabolic panel    Standing Status:   Future    Standing Expiration Date:   11/06/2024   Urinalysis, dipstick only    Standing Status:   Future    Standing Expiration Date:   11/06/2024   Magnesium    Standing Status:   Future    Standing Expiration Date:   11/20/2024   CBC with Differential    Standing Status:   Future    Standing Expiration Date:   11/20/2024   Comprehensive metabolic panel    Standing Status:   Future    Standing Expiration Date:   11/20/2024   Urinalysis, dipstick only    Standing Status:   Future    Standing  Expiration Date:   11/20/2024   Magnesium    Standing Status:   Future    Standing Expiration Date:   12/04/2024   CBC with Differential    Standing Status:   Future    Standing Expiration Date:   12/04/2024   Comprehensive metabolic panel    Standing Status:   Future    Standing Expiration Date:   12/04/2024   Urinalysis, dipstick only    Standing Status:   Future    Standing Expiration Date:   12/04/2024      I,Katie Daubenspeck,acting  as a scribe for [name], MD.,have documented all relevant documentation on the behalf of [name], MD,as directed by  [name], MD while in the presence of [name], MD.   I, [name] MD, have reviewed the above documentation for accuracy and completeness, and I agree with the above.    [name], MD   10/2/20243:55 PM  CHIEF COMPLAINT:   Diagnosis: metastatic colon cancer to the lungs and left adrenal gland    Cancer Staging  Cecal cancer [facility]) Staging form: Colon and Rectum, AJCC 8th Edition - Clinical stage from 10/07/2019: Stage IIIB (cT3, cN1, cM0) - Signed by [name], MD on 10/07/2019 - Pathologic stage from 01/23/2022: Stage IVB (rpTX, pN0, pM1b) - Unsigned    Prior Therapy: FOLFIRI 02/22/22 - 08/08/22   Current Therapy:  maintenance 5-FU + bevacizumab    HISTORY OF PRESENT ILLNESS:   Oncology History  Cecal cancer (HCC)  10/07/2019 Initial Diagnosis   Cecal cancer (HCC)   10/07/2019 Cancer Staging   Staging form: Colon and Rectum, AJCC 8th Edition - Clinical stage from 10/07/2019: Stage IIIB (cT3, cN1, cM0) - Signed by [name], MD on 10/07/2019   02/22/2022 - 08/08/2022 Chemotherapy   Patient is on Treatment Plan : COLORECTAL FOLFIRI / BEVACIZUMAB Q14D     02/22/2022 -  Chemotherapy   Patient is on Treatment Plan : COLORECTAL FOLFIRI + Bevacizumab q14d        INTERVAL HISTORY:   Evan [name] is a 67 y.o. 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 He was last seen by me on 08/15/23.  Since his last visit, he underwent restaging CT C/A/P on 09/05/23 showing: increased size of dominant LLL pulmonary nodule with new central occlusion of superior LUL segmental bronchus; additional bilateral irregular solid pulmonary nodules are stable or slightly increased in size; increased size of right hilar lymph node; stable pleural metastatic disease; stable left adrenal gland nodule.  Today, he states that he is doing well overall. His appetite level is at 70%. His energy level is at 80%.  PAST MEDICAL HISTORY:   Past Medical History: Past Medical History:  Diagnosis Date   Arthritis    Colon cancer (HCC)    colon   Hepatitis C    Hypertension    Port-A-Cath in place 02/15/2022    Surgical History: Past Surgical History:  Procedure Laterality Date   BIOPSY  03/12/2023   Procedure: BIOPSY;  Surgeon: Lanelle Bal, DO;  Location: [facility];  Service: Endoscopy;;   COLON SURGERY     ESOPHAGEAL BANDING N/A 03/12/2023   Procedure: ESOPHAGEAL BANDING;  Surgeon: Lanelle Bal, DO;  Location: [facility];  Service: Endoscopy;  Laterality: N/A;   ESOPHAGOGASTRODUODENOSCOPY (EGD) WITH PROPOFOL N/A 03/12/2023   Procedure: ESOPHAGOGASTRODUODENOSCOPY (EGD) WITH PROPOFOL;  Surgeon: Lanelle Bal, DO;  Location: [facility];  Service: Endoscopy;  Laterality: N/A;  11:30AM; ASA 3   PORTACATH PLACEMENT Right 02/08/2022   Procedure: INSERTION PORT-A-CATH- RIJ;  Surgeon: Lewie Chamber, DO;  Location: [facility];  Service: General;  Laterality: Right;   REPLACEMENT TOTAL KNEE Left    SHOULDER ARTHROSCOPY Bilateral    TOTAL HIP ARTHROPLASTY Right 11/09/2020   Procedure: TOTAL HIP ARTHROPLASTY ANTERIOR APPROACH;  Surgeon: Sheral Apley, MD;  Location: [facility];  Service: Orthopedics;  Laterality: Right;   WRIST SURGERY Left     Social History: Social History  Socioeconomic History   Marital status: Married    Spouse name: Not on file   Number of children: 7   Years of education: Not on file   Highest education level: Not on file  Occupational History   Occupation: employed  Tobacco Use   Smoking status: Former    Current packs/day: 0.00    Average packs/day: 1.5 packs/day for 20.0 years (30.0 ttl pk-yrs)    Types: Cigarettes    Start date: 11/19/1985    Quit date: 11/19/2005    Years since quitting: 17.8   Smokeless tobacco: Never  Vaping Use   Vaping status: Never Used  Substance and Sexual Activity   Alcohol use: Not Currently   Drug use: Never   Sexual activity: Yes  Other Topics Concern   Not on file  Social History Narrative   ** Merged History Encounter **       Separated from wife 11/2021   Social Determinants of Health   Financial Resource Strain: Low Risk  (10/07/2019)   Overall Financial Resource Strain (CARDIA)    Difficulty of Paying Living Expenses: Not very hard  Food Insecurity: No Food Insecurity (10/07/2019)   Hunger Vital Sign    Worried About Running Out of Food in the Last Year: Never true    Ran Out of Food in the Last Year: Never true  Transportation Needs: No Transportation Needs (10/07/2019)   PRAPARE - Administrator, Civil Service (Medical): No    Lack of Transportation (Non-Medical): No  Physical Activity: Inactive (10/07/2019)   Exercise Vital Sign    Days of Exercise per Week: 0 days    Minutes of Exercise per Session: 0 min  Stress: No Stress Concern Present (10/07/2019)   [employer] of Occupational Health - Occupational Stress Questionnaire    Feeling of Stress : Not at all  Social Connections: Moderately Integrated (10/07/2019)   Social Connection and Isolation Panel [NHANES]    Frequency of Communication with Friends and Family: Once a week    Frequency of Social Gatherings with Friends and Family: Once a week    Attends Religious Services: More than 4 times per  year    Active Member of [employer] or Organizations: Yes    Attends Engineer, structural: More than 4 times per year    Marital Status: Married  Catering manager Violence: Not At Risk (10/07/2019)   Humiliation, Afraid, Rape, and Kick questionnaire    Fear of Current or Ex-Partner: No    Emotionally Abused: No    Physically Abused: No    Sexually Abused: No    Family History: Family History  Problem Relation Age of Onset   Heart disease Mother    Dementia Father    Heart disease Sister    Cancer Brother    Hypertension Brother    Hypertension Brother    Healthy Son    Healthy Son    Healthy Son    Healthy Son    Healthy Daughter    Healthy Daughter    Healthy Daughter     Current Medications:  Current Outpatient Medications:    aluminum-magnesium hydroxide-simethicone (MAALOX) 200-200-20 MG/5ML SUSP, Take 30 mLs by mouth 4 (four) times daily -  before meals and at bedtime., Disp: 1680 mL, Rfl: 2   amLODipine (NORVASC) 10 MG tablet, Take 1 tablet (10 mg total) by mouth daily., Disp: 90 tablet, Rfl: 3   Bevacizumab (AVASTIN IV), Inject into the vein every 14 (fourteen) days. *start  date TBD, Disp: , Rfl:    fluorouracil CALGB 95188 2,400 mg/m2 in sodium chloride 0.9 % 150 mL, Inject 2,400 mg/m2 into the vein over 48 hr., Disp: , Rfl:    FLUOROURACIL IV, Inject into the vein every 14 (fourteen) days., Disp: , Rfl:    Lactulose 20 GM/30ML SOLN, Take 15 mLs (10 g total) by mouth at bedtime. Take 15 ml at bedtime every night to assist with regular bowel movements.  Titrate down if having multiple bowel movements.  If a bowel movement has not occurred in 3 to 4 days or longer, then take 15 ml every 3 hours until a bowel movent has occurred., Disp: 450 mL, Rfl: 5   LEUCOVORIN CALCIUM IV, Inject into the vein every 14 (fourteen) days., Disp: , Rfl:    megestrol (MEGACE) 400 MG/10ML suspension, Take 10 mLs (400 mg total) by mouth 2 (two) times daily., Disp: 480 mL, Rfl: 3    Oxycodone HCl 10 MG TABS, Take 1 tablet (10 mg total) by mouth every 4 (four) hours as needed., Disp: 180 tablet, Rfl: 0   pantoprazole (PROTONIX) 40 MG tablet, Take 1 tablet (40 mg total) by mouth daily., Disp: 30 tablet, Rfl: 11   potassium chloride SA (KLOR-CON M) 20 MEQ tablet, Take 1 tablet (20 mEq total) by mouth daily., Disp: 90 tablet, Rfl: 4 No current facility-administered medications for this visit.  Facility-Administered Medications Ordered in Other Visits:    fluorouracil (ADRUCIL) 5,000 mg in sodium chloride 0.9 % 150 mL chemo infusion, 2,400 mg/m2 (Treatment Plan Recorded), Intravenous, 1 day or 1 dose, [name], MD, Infusion Verify at 09/12/23 1354   heparin lock flush 100 unit/mL, 500 Units, Intracatheter, Once PRN, [name], MD   sodium chloride flush (NS) 0.9 % injection 10 mL, 10 mL, Intracatheter, PRN, [name], MD   Allergies: No Known Allergies  REVIEW OF SYSTEMS:   Review of Systems  Constitutional:  Negative for chills, fatigue and fever.  HENT:   Negative for lump/mass, mouth sores, nosebleeds, sore throat and trouble swallowing.   Eyes:  Negative for eye problems.  Respiratory:  Negative for cough and shortness of breath.   Cardiovascular:  Negative for chest pain, leg swelling and palpitations.  Gastrointestinal:  Negative for abdominal pain, constipation, diarrhea, nausea and vomiting.  Genitourinary:  Negative for bladder incontinence, difficulty urinating, dysuria, frequency, hematuria and nocturia.   Musculoskeletal:  Negative for arthralgias, back pain, flank pain, myalgias and neck pain.  Skin:  Negative for itching and rash.  Neurological:  Negative for dizziness, headaches and numbness.  Hematological:  Does not bruise/bleed easily.  Psychiatric/Behavioral:  Negative for depression, sleep disturbance and suicidal ideas. The patient is not nervous/anxious.   All other systems reviewed and are negative.    VITALS:    There were no vitals taken for this visit.  Wt Readings from Last 3 Encounters:  09/12/23 197 lb 3.2 oz (89.4 kg)  08/29/23 193 lb 12.8 oz (87.9 kg)  08/15/23 190 lb 11.2 oz (86.5 kg)    There is no height or weight on file to calculate BMI.  Performance status (ECOG): 1 - Symptomatic but completely ambulatory  PHYSICAL EXAM:   Physical Exam Vitals and nursing note reviewed. Exam conducted with a chaperone present.  Constitutional:      Appearance: Normal appearance.  Cardiovascular:     Rate and Rhythm: Normal rate and regular rhythm.     Pulses: Normal pulses.     Heart sounds: Normal heart  sounds.  Pulmonary:     Effort: Pulmonary effort is normal.     Breath sounds: Normal breath sounds.  Abdominal:     Palpations: Abdomen is soft. There is no hepatomegaly, splenomegaly or mass.     Tenderness: There is no abdominal tenderness.  Musculoskeletal:     Right lower leg: No edema.     Left lower leg: No edema.  Lymphadenopathy:     Cervical: No cervical adenopathy.     Right cervical: No superficial, deep or posterior cervical adenopathy.    Left cervical: No superficial, deep or posterior cervical adenopathy.     Upper Body:     Right upper body: No supraclavicular or axillary adenopathy.     Left upper body: No supraclavicular or axillary adenopathy.  Neurological:     General: No focal deficit present.     Mental Status: He is alert and oriented to person, place, and time.  Psychiatric:        Mood and Affect: Mood normal.        Behavior: Behavior normal.     LABS:      Latest Ref Rng & Units 09/12/2023    8:31 AM 08/29/2023    9:36 AM 08/15/2023    8:51 AM  CBC  WBC 4.0 - 10.5 K/uL 6.9  6.7  8.6   Hemoglobin 13.0 - 17.0 g/dL 41.3  24.4  01.0   Hematocrit 39.0 - 52.0 % 35.5  35.8  39.9   Platelets 150 - 400 K/uL 218  158  154       Latest Ref Rng & Units 09/12/2023    8:31 AM 08/29/2023    9:36 AM 08/15/2023    8:51 AM  CMP  Glucose 70 - 99 mg/dL 272  89   536   BUN 8 - 23 mg/dL 10  7  9    Creatinine 0.61 - 1.24 mg/dL 6.44  0.34  7.42   Sodium 135 - 145 mmol/L 134  135  135   Potassium 3.5 - 5.1 mmol/L 3.7  3.5  3.6   Chloride 98 - 111 mmol/L 102  103  103   CO2 22 - 32 mmol/L 24  20  21    Calcium 8.9 - 10.3 mg/dL 9.1  8.9  9.1   Total Protein 6.5 - 8.1 g/dL 7.4  6.9  7.5   Total Bilirubin 0.3 - 1.2 mg/dL 0.8  0.7  1.1   Alkaline Phos 38 - 126 U/L 36  44  37   AST 15 - 41 U/L 19  18  18    ALT 0 - 44 U/L 9  9  10       Lab Results  Component Value Date   CEA1 124.0 (H) 08/29/2023   /  CEA  Date Value Ref Range Status  08/29/2023 124.0 (H) 0.0 - 4.7 ng/mL Final    Comment:    (NOTE)                             Nonsmokers          <3.9                             Smokers             <5.6 [employer] Electrochemiluminescence Immunoassay (ECLIA) Values obtained with different assay methods or kits cannot be used interchangeably.  Results cannot be interpreted as absolute evidence of the presence or absence of malignant disease. Performed At: [facility] [address] [place], [place] [id number] Jolene Schimke MD [id number]    No results found for: "PSA1" No results found for: [phone]" No results found for: "CAN125"  No results found for: "TOTALPROTELP", "ALBUMINELP", "A1GS", "A2GS", "BETS", "BETA2SER", "GAMS", "MSPIKE", "SPEI" Lab Results  Component Value Date   TIBC 406 01/16/2023   TIBC 294 06/06/2022   TIBC 237 (L) 02/22/2022   FERRITIN 150 01/16/2023   FERRITIN 243 06/06/2022   FERRITIN 292 02/22/2022   IRONPCTSAT 18 01/16/2023   IRONPCTSAT 24 06/06/2022   IRONPCTSAT 11 (L) 02/22/2022   Lab Results  Component Value Date   LDH 258 (H) 12/26/2021     STUDIES:   CT CHEST ABDOMEN PELVIS W CONTRAST  Result Date: 09/11/2023 CLINICAL DATA:  Metastatic colon cancer; * Tracking Code: BO * EXAM: CT CHEST, ABDOMEN, AND PELVIS WITH CONTRAST TECHNIQUE: Multidetector CT imaging of the chest, abdomen  and pelvis was performed following the standard protocol during bolus administration of intravenous contrast. RADIATION DOSE REDUCTION: This exam was performed according to the departmental dose-optimization program which includes automated exposure control, adjustment of the mA and/or kV according to patient size and/or use of iterative reconstruction technique. CONTRAST:  OMNIPAQUE IOHEXOL 300 MG/ML  SOLN COMPARISON:  Multiple priors, most recent CT chest abdomen and pelvis dated January 22, 2023 FINDINGS: CT CHEST FINDINGS Cardiovascular: Normal heart size. No pericardial effusion. Normal caliber thoracic aorta with mild atherosclerotic disease. Mild coronary artery calcifications. Mediastinum/Nodes: Esophagus and thyroid are unremarkable. Increased size of right hilar lymph node measuring 2.6 cm in short axis, previously 1.8 cm. Stable enlarged mediastinal lymph nodes. Lungs/Pleura: Increased size of dominant left lower lobe pulmonary nodule measures 8.0 x 3.8 cm, previously 6.6 x 2.8 cm. New central occlusion of the superior left upper lobe segmental bronchus. Additional bilateral irregular solid pulmonary nodules are stable or slightly increased in size. Reference right upper lobe nodule measures 3.2 x 2.1 cm, previously 3.2 x 1.7 cm. Reference nodule of the superior portion of the left lower lobe measuring 1.4 cm on image 67, previously 0.9 cm. Multiple bilateral pleural nodules are unchanged in size when compared with the prior exam. Reference pleural nodule of the right lower lobe measuring 2.2 x 1.1 cm on image 115, unchanged. Trace left pleural effusion. Musculoskeletal: No aggressive appearing osseous lesions in the region of the chest. CT ABDOMEN PELVIS FINDINGS Hepatobiliary: No focal liver abnormality is seen. No gallstones, gallbladder wall thickening, or biliary dilatation. Pancreas: Unremarkable. No pancreatic ductal dilatation or surrounding inflammatory changes. Spleen: Normal in size  without focal abnormality. Adrenals/Urinary Tract: Stable left adrenal gland nodule measuring 1.4 cm. No hydronephrosis or nephrolithiasis. Bladder is unremarkable. Stomach/Bowel: Stomach is within normal limits. Prior right hemicolectomy. No evidence of bowel wall thickening, distention, or inflammatory changes. Vascular/Lymphatic: Aortic atherosclerosis. No enlarged abdominal or pelvic lymph nodes. Reproductive: Prostate is unremarkable. Other: No abdominal wall hernia or abnormality. No abdominopelvic ascites. Musculoskeletal: Prior right total hip arthroplasty. Stable endplate lucency with associated sclerosis of L3, likely a Schmorl's node. No aggressive appearing osseous lesions IMPRESSION: 1. Increased size of dominant left lower lobe pulmonary nodule with new central occlusion of the superior left upper lobe segmental bronchus. 2. Additional bilateral irregular solid pulmonary nodules are stable or slightly increased in size. 3. Increased size of right hilar lymph node. 4. Stable pleural metastatic disease. 5. Stable left adrenal gland nodule. 6. Aortic  Atherosclerosis (ICD10-I70.0). Electronically Signed   By: Allegra Lai M.D.   On: 09/11/2023 16:25

## 2023-09-12 ENCOUNTER — Inpatient Hospital Stay: Payer: Medicare Other

## 2023-09-12 ENCOUNTER — Inpatient Hospital Stay (HOSPITAL_BASED_OUTPATIENT_CLINIC_OR_DEPARTMENT_OTHER): Payer: Medicare Other | Admitting: Hematology

## 2023-09-12 ENCOUNTER — Inpatient Hospital Stay: Payer: Medicare Other | Attending: Hematology

## 2023-09-12 VITALS — BP 156/95 | HR 80 | Temp 97.2°F | Resp 18 | Wt 197.2 lb

## 2023-09-12 VITALS — BP 144/88 | HR 61 | Temp 96.7°F | Resp 18

## 2023-09-12 DIAGNOSIS — C782 Secondary malignant neoplasm of pleura: Secondary | ICD-10-CM | POA: Insufficient documentation

## 2023-09-12 DIAGNOSIS — C180 Malignant neoplasm of cecum: Secondary | ICD-10-CM | POA: Diagnosis not present

## 2023-09-12 DIAGNOSIS — C7972 Secondary malignant neoplasm of left adrenal gland: Secondary | ICD-10-CM | POA: Diagnosis not present

## 2023-09-12 DIAGNOSIS — Z95828 Presence of other vascular implants and grafts: Secondary | ICD-10-CM

## 2023-09-12 DIAGNOSIS — C7802 Secondary malignant neoplasm of left lung: Secondary | ICD-10-CM | POA: Insufficient documentation

## 2023-09-12 DIAGNOSIS — Z5189 Encounter for other specified aftercare: Secondary | ICD-10-CM | POA: Diagnosis not present

## 2023-09-12 DIAGNOSIS — Z87891 Personal history of nicotine dependence: Secondary | ICD-10-CM | POA: Diagnosis not present

## 2023-09-12 DIAGNOSIS — C7801 Secondary malignant neoplasm of right lung: Secondary | ICD-10-CM | POA: Diagnosis not present

## 2023-09-12 DIAGNOSIS — Z5111 Encounter for antineoplastic chemotherapy: Secondary | ICD-10-CM | POA: Insufficient documentation

## 2023-09-12 DIAGNOSIS — Z5112 Encounter for antineoplastic immunotherapy: Secondary | ICD-10-CM | POA: Diagnosis not present

## 2023-09-12 DIAGNOSIS — C189 Malignant neoplasm of colon, unspecified: Secondary | ICD-10-CM | POA: Diagnosis not present

## 2023-09-12 DIAGNOSIS — Z79899 Other long term (current) drug therapy: Secondary | ICD-10-CM | POA: Insufficient documentation

## 2023-09-12 DIAGNOSIS — K5900 Constipation, unspecified: Secondary | ICD-10-CM

## 2023-09-12 DIAGNOSIS — D702 Other drug-induced agranulocytosis: Secondary | ICD-10-CM

## 2023-09-12 LAB — COMPREHENSIVE METABOLIC PANEL
ALT: 9 U/L (ref 0–44)
AST: 19 U/L (ref 15–41)
Albumin: 3.7 g/dL (ref 3.5–5.0)
Alkaline Phosphatase: 36 U/L — ABNORMAL LOW (ref 38–126)
Anion gap: 8 (ref 5–15)
BUN: 10 mg/dL (ref 8–23)
CO2: 24 mmol/L (ref 22–32)
Calcium: 9.1 mg/dL (ref 8.9–10.3)
Chloride: 102 mmol/L (ref 98–111)
Creatinine, Ser: 1.09 mg/dL (ref 0.61–1.24)
GFR, Estimated: >60 mL/min (ref >60)
Glucose, Bld: 133 mg/dL — ABNORMAL HIGH (ref 70–99)
Potassium: 3.7 mmol/L (ref 3.5–5.1)
Sodium: 134 mmol/L — ABNORMAL LOW (ref 135–145)
Total Bilirubin: 0.8 mg/dL (ref 0.3–1.2)
Total Protein: 7.4 g/dL (ref 6.5–8.1)

## 2023-09-12 LAB — URINALYSIS, DIPSTICK ONLY
Bilirubin Urine: NEGATIVE
Glucose, UA: NEGATIVE mg/dL
Ketones, ur: NEGATIVE mg/dL
Leukocytes,Ua: NEGATIVE
Nitrite: NEGATIVE
Protein, ur: 100 mg/dL — AB
Specific Gravity, Urine: 1.017 (ref 1.005–1.030)
pH: 5 (ref 5.0–8.0)

## 2023-09-12 LAB — CBC WITH DIFFERENTIAL/PLATELET
Abs Immature Granulocytes: 0.08 10*3/uL — ABNORMAL HIGH (ref 0.00–0.07)
Basophils Absolute: 0.1 10*3/uL (ref 0.0–0.1)
Basophils Relative: 1 %
Eosinophils Absolute: 0.2 10*3/uL (ref 0.0–0.5)
Eosinophils Relative: 4 %
HCT: 35.5 % — ABNORMAL LOW (ref 39.0–52.0)
Hemoglobin: 11.8 g/dL — ABNORMAL LOW (ref 13.0–17.0)
Immature Granulocytes: 1 %
Lymphocytes Relative: 42 %
Lymphs Abs: 2.9 10*3/uL (ref 0.7–4.0)
MCH: 31.8 pg (ref 26.0–34.0)
MCHC: 33.2 g/dL (ref 30.0–36.0)
MCV: 95.7 fL (ref 80.0–100.0)
Monocytes Absolute: 0.9 10*3/uL (ref 0.1–1.0)
Monocytes Relative: 13 %
Neutro Abs: 2.7 10*3/uL (ref 1.7–7.7)
Neutrophils Relative %: 39 %
Platelets: 218 10*3/uL (ref 150–400)
RBC: 3.71 MIL/uL — ABNORMAL LOW (ref 4.22–5.81)
RDW: 16.7 % — ABNORMAL HIGH (ref 11.5–15.5)
WBC: 6.9 10*3/uL (ref 4.0–10.5)
nRBC: 0 % (ref 0.0–0.2)

## 2023-09-12 LAB — MAGNESIUM: Magnesium: 1.9 mg/dL (ref 1.7–2.4)

## 2023-09-12 MED ORDER — SODIUM CHLORIDE 0.9 % IV SOLN
10.0000 mg | Freq: Once | INTRAVENOUS | Status: AC
  Administered 2023-09-12: 10 mg via INTRAVENOUS
  Filled 2023-09-12: qty 10

## 2023-09-12 MED ORDER — SODIUM CHLORIDE 0.9 % IV SOLN
5.0000 mg/kg | Freq: Once | INTRAVENOUS | Status: AC
  Administered 2023-09-12: 400 mg via INTRAVENOUS
  Filled 2023-09-12: qty 16

## 2023-09-12 MED ORDER — FLUOROURACIL CHEMO INJECTION 2.5 GM/50ML
400.0000 mg/m2 | Freq: Once | INTRAVENOUS | Status: AC
  Administered 2023-09-12: 800 mg via INTRAVENOUS
  Filled 2023-09-12: qty 16

## 2023-09-12 MED ORDER — SODIUM CHLORIDE 0.9 % IV SOLN
400.0000 mg/m2 | Freq: Once | INTRAVENOUS | Status: AC
  Administered 2023-09-12: 780 mg via INTRAVENOUS
  Filled 2023-09-12: qty 39

## 2023-09-12 MED ORDER — SODIUM CHLORIDE 0.9 % IV SOLN
180.0000 mg/m2 | Freq: Once | INTRAVENOUS | Status: AC
  Administered 2023-09-12: 360 mg via INTRAVENOUS
  Filled 2023-09-12: qty 18

## 2023-09-12 MED ORDER — SODIUM CHLORIDE 0.9 % IV SOLN: Freq: Once | INTRAVENOUS | Status: AC

## 2023-09-12 MED ORDER — SODIUM CHLORIDE 0.9 % IV SOLN
2400.0000 mg/m2 | INTRAVENOUS | Status: DC
  Administered 2023-09-12: 5000 mg via INTRAVENOUS
  Filled 2023-09-12: qty 100

## 2023-09-12 MED ORDER — LACTULOSE 20 GM/30ML PO SOLN
10.0000 g | Freq: Every day | ORAL | 5 refills | Status: DC
Start: 2023-09-12 — End: 2024-03-10

## 2023-09-12 MED ORDER — PALONOSETRON HCL INJECTION 0.25 MG/5ML
0.2500 mg | Freq: Once | INTRAVENOUS | Status: AC
  Administered 2023-09-12: 0.25 mg via INTRAVENOUS
  Filled 2023-09-12: qty 5

## 2023-09-12 MED ORDER — MEGESTROL ACETATE 400 MG/10ML PO SUSP: 400.0000 mg | Freq: Two times a day (BID) | ORAL | 3 refills | Status: DC

## 2023-09-12 MED ORDER — SODIUM CHLORIDE 0.9% FLUSH: 10.0000 mL | INTRAVENOUS | Status: DC | PRN

## 2023-09-12 MED ORDER — SODIUM CHLORIDE 0.9% FLUSH
10.0000 mL | Freq: Once | INTRAVENOUS | Status: AC
  Administered 2023-09-12: 10 mL

## 2023-09-12 MED ORDER — HEPARIN SOD (PORK) LOCK FLUSH 100 UNIT/ML IV SOLN: 500.0000 [IU] | Freq: Once | INTRAVENOUS | Status: DC | PRN

## 2023-09-12 NOTE — Patient Instructions (Signed)
[facility] at [facility] Discharge Instructions   You were seen and examined today by Dr. Ellin Saba.  He reviewed the results of your lab work which are normal/stable.   He reviewed the results of your CT scan. 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   We will proceed with your treatment today. We will add back the chemotherapy (irinotecan) to your treatment regimen to get the cancer back under control.   Return as scheduled.    Thank you for choosing [facility] at [facility] to provide your oncology and hematology care.  To afford each patient quality time with our provider, please arrive at least 15 minutes before your scheduled appointment time.   If you have a lab appointment with the Cancer Center please come in thru the Main Entrance and check in at the main information desk.  You need to re-schedule your appointment should you arrive 10 or more minutes late.  We strive to give you quality time with our providers, and arriving late affects you and other patients whose appointments are after yours.  Also, if you no show three or more times for appointments you may be dismissed from the clinic at the providers discretion.     Again, thank you for choosing [facility].  Our hope is that these requests will decrease the amount of time that you wait before being seen by our physicians.       _____________________________________________________________  Should you have questions after your visit to [facility], please contact our office at [phone] and follow the prompts.  Our office hours are 8:00 a.m. and 4:30 p.m. Monday - Friday.  Please note that voicemails left after 4:00 p.m. may not be returned until the following business day.  We are closed weekends and major holidays.  You do have access to a nurse 24-7, just call the main number to the clinic [phone]  and do not press any options, hold on the line and a nurse will answer the phone.    For prescription refill requests, have your pharmacy contact our office and allow 72 hours.    Due to Covid, you will need to wear a mask upon entering the hospital. If you do not have a mask, a mask will be given to you at the Main Entrance upon arrival. For doctor visits, patients may have 1 support person age 80 or older with them. For treatment visits, patients can not have anyone with them due to social distancing guidelines and our immunocompromised population.

## 2023-09-12 NOTE — Progress Notes (Unsigned)
Adding irinotecan back to plan and Udenyca to pump DC day per MD.  Maintain today's dose of Mvasi at 400 mg despite weight change.  T.O. Dr Carilyn Goodpasture, PharmD

## 2023-09-12 NOTE — Progress Notes (Unsigned)
Patient presents today for chemotherapy infusion. Patient is in satisfactory condition with no new complaints voiced.  Vital signs are stable.  Labs reviewed by Dr. Ellin Saba during the office visit and all labs are within treatment parameters with exception of urine protein 100, may continue with treatment per Dr Ellin Saba. CT also reviewed during office visit, CT showed progression and Irinotecan added to treatment plan per Dr Marice Potter order.  We will proceed with treatment per MD orders.

## 2023-09-13 ENCOUNTER — Encounter: Payer: Self-pay | Admitting: Hematology

## 2023-09-13 ENCOUNTER — Encounter (HOSPITAL_COMMUNITY): Payer: Self-pay | Admitting: Hematology

## 2023-09-13 ENCOUNTER — Other Ambulatory Visit: Payer: Self-pay

## 2023-09-13 NOTE — Progress Notes (Signed)
Treatment given per orders. Patient tolerated it well without problems. Vitals stable and discharged home from clinic ambulatory. Follow up as scheduled.  

## 2023-09-13 NOTE — Patient Instructions (Addendum)
[facility]  Discharge Instructions: Thank you for choosing [facility] to provide your oncology and hematology care.  If you have a lab appointment with the Cancer Center - please note that after April 8th, 2024, all labs will be drawn in the cancer center.  You do not have to check in or register with the main entrance as you have in the past but will complete your check-in in the cancer center.  Wear comfortable clothing and clothing appropriate for easy access to any Portacath or PICC line.   We strive to give you quality time with your provider. You may need to reschedule your appointment if you arrive late (15 or more minutes).  Arriving late affects you and other patients whose appointments are after yours.  Also, if you miss three or more appointments without notifying the office, you may be dismissed from the clinic at the provider's discretion.      For prescription refill requests, have your pharmacy contact our office and allow 72 hours for refills to be completed.    Today you received the following chemotherapy and/or immunotherapy agents Irinotecan, leucovorin, Adrucil     To help prevent nausea and vomiting after your treatment, we encourage you to take your nausea medication as directed.  BELOW ARE SYMPTOMS THAT SHOULD BE REPORTED IMMEDIATELY: *FEVER GREATER THAN 100.4 F (38 C) OR HIGHER *CHILLS OR SWEATING *NAUSEA AND VOMITING THAT IS NOT CONTROLLED WITH YOUR NAUSEA MEDICATION *UNUSUAL SHORTNESS OF BREATH *UNUSUAL BRUISING OR BLEEDING *URINARY PROBLEMS (pain or burning when urinating, or frequent urination) *BOWEL PROBLEMS (unusual diarrhea, constipation, pain near the anus) TENDERNESS IN MOUTH AND THROAT WITH OR WITHOUT PRESENCE OF ULCERS (sore throat, sores in mouth, or a toothache) UNUSUAL RASH, SWELLING OR PAIN  UNUSUAL VAGINAL DISCHARGE OR ITCHING   Items with * indicate a potential emergency and should be followed up as soon as  possible or go to the Emergency Department if any problems should occur.  Please show the CHEMOTHERAPY ALERT CARD or IMMUNOTHERAPY ALERT CARD at check-in to the Emergency Department and triage nurse.  Should you have questions after your visit or need to cancel or reschedule your appointment, please contact [facility] CENTER AT [facility] [phone]  and follow the prompts.  Office hours are 8:00 a.m. to 4:30 p.m. Monday - Friday. Please note that voicemails left after 4:00 p.m. may not be returned until the following business day.  We are closed weekends and major holidays. You have access to a nurse at all times for urgent questions. Please call the main number to the clinic [phone] and follow the prompts.  For any non-urgent questions, you may also contact your provider using MyChart. We now offer e-Visits for anyone 8 and older to request care online for non-urgent symptoms. For details visit [email or web address].   Also download the MyChart app! Go to the app store, search "MyChart", open the app, select , and log in with your MyChart username and password.

## 2023-09-14 ENCOUNTER — Inpatient Hospital Stay: Payer: Medicare Other

## 2023-09-14 VITALS — BP 118/86 | HR 78 | Temp 97.2°F | Resp 18

## 2023-09-14 DIAGNOSIS — Z95828 Presence of other vascular implants and grafts: Secondary | ICD-10-CM

## 2023-09-14 DIAGNOSIS — C7801 Secondary malignant neoplasm of right lung: Secondary | ICD-10-CM | POA: Diagnosis not present

## 2023-09-14 DIAGNOSIS — Z5112 Encounter for antineoplastic immunotherapy: Secondary | ICD-10-CM | POA: Diagnosis not present

## 2023-09-14 DIAGNOSIS — Z87891 Personal history of nicotine dependence: Secondary | ICD-10-CM | POA: Diagnosis not present

## 2023-09-14 DIAGNOSIS — Z5111 Encounter for antineoplastic chemotherapy: Secondary | ICD-10-CM | POA: Diagnosis not present

## 2023-09-14 DIAGNOSIS — C180 Malignant neoplasm of cecum: Secondary | ICD-10-CM

## 2023-09-14 DIAGNOSIS — C782 Secondary malignant neoplasm of pleura: Secondary | ICD-10-CM | POA: Diagnosis not present

## 2023-09-14 DIAGNOSIS — Z79899 Other long term (current) drug therapy: Secondary | ICD-10-CM | POA: Diagnosis not present

## 2023-09-14 DIAGNOSIS — C7972 Secondary malignant neoplasm of left adrenal gland: Secondary | ICD-10-CM | POA: Diagnosis not present

## 2023-09-14 DIAGNOSIS — C7802 Secondary malignant neoplasm of left lung: Secondary | ICD-10-CM | POA: Diagnosis not present

## 2023-09-14 DIAGNOSIS — Z5189 Encounter for other specified aftercare: Secondary | ICD-10-CM | POA: Diagnosis not present

## 2023-09-14 MED ORDER — SODIUM CHLORIDE 0.9% FLUSH
10.0000 mL | INTRAVENOUS | Status: DC | PRN
  Administered 2023-09-14: 10 mL

## 2023-09-14 MED ORDER — PEGFILGRASTIM-CBQV 6 MG/0.6ML ~~LOC~~ SOSY
6.0000 mg | PREFILLED_SYRINGE | Freq: Once | SUBCUTANEOUS | Status: AC
  Administered 2023-09-14: 6 mg via SUBCUTANEOUS
  Filled 2023-09-14: qty 0.6

## 2023-09-14 MED ORDER — HEPARIN SOD (PORK) LOCK FLUSH 100 UNIT/ML IV SOLN
500.0000 [IU] | Freq: Once | INTRAVENOUS | Status: AC | PRN
  Administered 2023-09-14: 500 [IU]

## 2023-09-14 NOTE — Patient Instructions (Signed)
[facility]  Discharge Instructions: Thank you for choosing  Cancer Center to provide your oncology and hematology care.  If you have a lab appointment with the Cancer Center - please note that after April 8th, 2024, all labs will be drawn in the cancer center.  You do not have to check in or register with the main entrance as you have in the past but will complete your check-in in the cancer center.  Wear comfortable clothing and clothing appropriate for easy access to any Portacath or PICC line.   We strive to give you quality time with your provider. You may need to reschedule your appointment if you arrive late (15 or more minutes).  Arriving late affects you and other patients whose appointments are after yours.  Also, if you miss three or more appointments without notifying the office, you may be dismissed from the clinic at the provider's discretion.      For prescription refill requests, have your pharmacy contact our office and allow 72 hours for refills to be completed.    Today you received the following Udenyca injection.    Pegfilgrastim Injection What is this medication? PEGFILGRASTIM (PEG fil gra stim) lowers the risk of infection in people who are receiving chemotherapy. It works by Systems analyst make more white blood cells, which protects your body from infection. It may also be used to help people who have been exposed to high doses of radiation. This medicine may be used for other purposes; ask your health care provider or pharmacist if you have questions. COMMON BRAND NAME(S): Cherly Hensen, Neulasta, Nyvepria, Stimufend, UDENYCA, UDENYCA ONBODY, Ziextenzo What should I tell my care team before I take this medication? They need to know if you have any of these conditions: Kidney disease Latex allergy Ongoing radiation therapy Sickle cell disease Skin reactions to acrylic adhesives (On-Body Injector only) An unusual or allergic  reaction to pegfilgrastim, filgrastim, other medications, foods, dyes, or preservatives Pregnant or trying to get pregnant Breast-feeding How should I use this medication? This medication is for injection under the skin. If you get this medication at home, you will be taught how to prepare and give the pre-filled syringe or how to use the On-body Injector. Refer to the patient Instructions for Use for detailed instructions. Use exactly as directed. Tell your care team immediately if you suspect that the On-body Injector may not have performed as intended or if you suspect the use of the On-body Injector resulted in a missed or partial dose. It is important that you put your used needles and syringes in a special sharps container. Do not put them in a trash can. If you do not have a sharps container, call your pharmacist or care team to get one. Talk to your care team about the use of this medication in children. While this medication may be prescribed for selected conditions, precautions do apply. Overdosage: If you think you have taken too much of this medicine contact a poison control center or emergency room at once. NOTE: This medicine is only for you. Do not share this medicine with others. What if I miss a dose? It is important not to miss your dose. Call your care team if you miss your dose. If you miss a dose due to an On-body Injector failure or leakage, a new dose should be administered as soon as possible using a single prefilled syringe for manual use. What may interact with this medication? Interactions have  not been studied. This list may not describe all possible interactions. Give your health care provider a list of all the medicines, herbs, non-prescription drugs, or dietary supplements you use. Also tell them if you smoke, drink alcohol, or use illegal drugs. Some items may interact with your medicine. What should I watch for while using this medication? Your condition will be  monitored carefully while you are receiving this medication. You may need blood work done while you are taking this medication. Talk to your care team about your risk of cancer. You may be more at risk for certain types of cancer if you take this medication. If you are going to need a MRI, CT scan, or other procedure, tell your care team that you are using this medication (On-Body Injector only). What side effects may I notice from receiving this medication? Side effects that you should report to your care team as soon as possible: Allergic reactions--skin rash, itching, hives, swelling of the face, lips, tongue, or throat Capillary leak syndrome--stomach or muscle pain, unusual weakness or fatigue, feeling faint or lightheaded, decrease in the amount of urine, swelling of the ankles, hands, or feet, trouble breathing High white blood cell level--fever, fatigue, trouble breathing, night sweats, change in vision, weight loss Inflammation of the aorta--fever, fatigue, back, chest, or stomach pain, severe headache Kidney injury (glomerulonephritis)--decrease in the amount of urine, red or dark brown urine, foamy or bubbly urine, swelling of the ankles, hands, or feet Shortness of breath or trouble breathing Spleen injury--pain in upper left stomach or shoulder Unusual bruising or bleeding Side effects that usually do not require medical attention (report to your care team if they continue or are bothersome): Bone pain Pain in the hands or feet This list may not describe all possible side effects. Call your doctor for medical advice about side effects. You may report side effects to FDA at 1-800-FDA-1088. Where should I keep my medication? Keep out of the reach of children. If you are using this medication at home, you will be instructed on how to store it. Throw away any unused medication after the expiration date on the label. NOTE: This sheet is a summary. It may not cover all possible  information. If you have questions about this medicine, talk to your doctor, pharmacist, or health care provider.  2024 Elsevier/Gold Standard (2021-10-28 00:00:00)    To help prevent nausea and vomiting after your treatment, we encourage you to take your nausea medication as directed.  BELOW ARE SYMPTOMS THAT SHOULD BE REPORTED IMMEDIATELY: *FEVER GREATER THAN 100.4 F (38 C) OR HIGHER *CHILLS OR SWEATING *NAUSEA AND VOMITING THAT IS NOT CONTROLLED WITH YOUR NAUSEA MEDICATION *UNUSUAL SHORTNESS OF BREATH *UNUSUAL BRUISING OR BLEEDING *URINARY PROBLEMS (pain or burning when urinating, or frequent urination) *BOWEL PROBLEMS (unusual diarrhea, constipation, pain near the anus) TENDERNESS IN MOUTH AND THROAT WITH OR WITHOUT PRESENCE OF ULCERS (sore throat, sores in mouth, or a toothache) UNUSUAL RASH, SWELLING OR PAIN  UNUSUAL VAGINAL DISCHARGE OR ITCHING   Items with * indicate a potential emergency and should be followed up as soon as possible or go to the Emergency Department if any problems should occur.  Please show the CHEMOTHERAPY ALERT CARD or IMMUNOTHERAPY ALERT CARD at check-in to the Emergency Department and triage nurse.  Should you have questions after your visit or need to cancel or reschedule your appointment, please contact [facility]ENTER AT [facility] [phone]  and follow the prompts.  Office hours are 8:00 a.m. to 4:30  p.m. Monday - Friday. Please note that voicemails left after 4:00 p.m. may not be returned until the following business day.  We are closed weekends and major holidays. You have access to a nurse at all times for urgent questions. Please call the main number to the clinic [phone] and follow the prompts.  For any non-urgent questions, you may also contact your provider using MyChart. We now offer e-Visits for anyone 65 and older to request care online for non-urgent symptoms. For details visit [email or web address].   Also download the MyChart  app! Go to the app store, search "MyChart", open the app, select [place], and log in with your MyChart username and password.

## 2023-09-14 NOTE — Progress Notes (Signed)
Patient presents today to have 5FU pump disconnected and Udenyca injection. Pump removed without any difficulties. Patients port flushed without difficulty.  Good blood return noted with no bruising or swelling noted at site.  Band aid applied. Patient tolerated injection in right arm well with no complaints voiced.  Site clean and dry with no bruising or swelling noted.  No complaints of pain.  Discharged with vital signs stable and no signs or symptoms of distress noted.

## 2023-09-20 ENCOUNTER — Encounter (HOSPITAL_COMMUNITY): Payer: Self-pay | Admitting: Hematology

## 2023-09-20 ENCOUNTER — Encounter: Payer: Self-pay | Admitting: Hematology

## 2023-09-25 ENCOUNTER — Other Ambulatory Visit: Payer: Self-pay

## 2023-09-25 ENCOUNTER — Inpatient Hospital Stay: Payer: Medicare Other

## 2023-09-25 ENCOUNTER — Ambulatory Visit: Payer: Medicare Other | Admitting: Hematology

## 2023-09-25 ENCOUNTER — Other Ambulatory Visit: Payer: Medicare Other

## 2023-09-25 ENCOUNTER — Ambulatory Visit: Payer: Medicare Other

## 2023-09-25 VITALS — BP 132/80 | HR 66 | Temp 97.1°F | Resp 18 | Wt 190.3 lb

## 2023-09-25 DIAGNOSIS — C180 Malignant neoplasm of cecum: Secondary | ICD-10-CM

## 2023-09-25 DIAGNOSIS — C7972 Secondary malignant neoplasm of left adrenal gland: Secondary | ICD-10-CM | POA: Diagnosis not present

## 2023-09-25 DIAGNOSIS — Z5111 Encounter for antineoplastic chemotherapy: Secondary | ICD-10-CM | POA: Diagnosis not present

## 2023-09-25 DIAGNOSIS — Z79899 Other long term (current) drug therapy: Secondary | ICD-10-CM | POA: Diagnosis not present

## 2023-09-25 DIAGNOSIS — Z5112 Encounter for antineoplastic immunotherapy: Secondary | ICD-10-CM | POA: Diagnosis not present

## 2023-09-25 DIAGNOSIS — C7802 Secondary malignant neoplasm of left lung: Secondary | ICD-10-CM | POA: Diagnosis not present

## 2023-09-25 DIAGNOSIS — Z5189 Encounter for other specified aftercare: Secondary | ICD-10-CM | POA: Diagnosis not present

## 2023-09-25 DIAGNOSIS — C7801 Secondary malignant neoplasm of right lung: Secondary | ICD-10-CM | POA: Diagnosis not present

## 2023-09-25 DIAGNOSIS — C782 Secondary malignant neoplasm of pleura: Secondary | ICD-10-CM | POA: Diagnosis not present

## 2023-09-25 DIAGNOSIS — Z95828 Presence of other vascular implants and grafts: Secondary | ICD-10-CM

## 2023-09-25 DIAGNOSIS — Z87891 Personal history of nicotine dependence: Secondary | ICD-10-CM | POA: Diagnosis not present

## 2023-09-25 LAB — URINALYSIS, DIPSTICK ONLY
Bilirubin Urine: NEGATIVE
Glucose, UA: NEGATIVE mg/dL
Hgb urine dipstick: NEGATIVE
Ketones, ur: NEGATIVE mg/dL
Leukocytes,Ua: NEGATIVE
Nitrite: NEGATIVE
Protein, ur: 30 mg/dL — AB
Specific Gravity, Urine: 1.02 (ref 1.005–1.030)
pH: 5 (ref 5.0–8.0)

## 2023-09-25 LAB — COMPREHENSIVE METABOLIC PANEL
ALT: 10 U/L (ref 0–44)
AST: 22 U/L (ref 15–41)
Albumin: 3.7 g/dL (ref 3.5–5.0)
Alkaline Phosphatase: 59 U/L (ref 38–126)
Anion gap: 9 (ref 5–15)
BUN: 13 mg/dL (ref 8–23)
CO2: 21 mmol/L — ABNORMAL LOW (ref 22–32)
Calcium: 8.7 mg/dL — ABNORMAL LOW (ref 8.9–10.3)
Chloride: 103 mmol/L (ref 98–111)
Creatinine, Ser: 1.18 mg/dL (ref 0.61–1.24)
GFR, Estimated: >60 mL/min (ref >60)
Glucose, Bld: 131 mg/dL — ABNORMAL HIGH (ref 70–99)
Potassium: 3.7 mmol/L (ref 3.5–5.1)
Sodium: 133 mmol/L — ABNORMAL LOW (ref 135–145)
Total Bilirubin: 0.5 mg/dL (ref 0.3–1.2)
Total Protein: 7.4 g/dL (ref 6.5–8.1)

## 2023-09-25 LAB — MAGNESIUM: Magnesium: 1.9 mg/dL (ref 1.7–2.4)

## 2023-09-25 MED ORDER — SODIUM CHLORIDE 0.9 % IV SOLN
400.0000 mg/m2 | Freq: Once | INTRAVENOUS | Status: AC
  Administered 2023-09-25: 780 mg via INTRAVENOUS
  Filled 2023-09-25: qty 39

## 2023-09-25 MED ORDER — HEPARIN SOD (PORK) LOCK FLUSH 100 UNIT/ML IV SOLN: 500.0000 [IU] | Freq: Once | INTRAVENOUS | Status: DC | PRN

## 2023-09-25 MED ORDER — FLUOROURACIL CHEMO INJECTION 2.5 GM/50ML
400.0000 mg/m2 | Freq: Once | INTRAVENOUS | Status: AC
  Administered 2023-09-25: 800 mg via INTRAVENOUS
  Filled 2023-09-25: qty 16

## 2023-09-25 MED ORDER — SODIUM CHLORIDE 0.9 % IV SOLN
10.0000 mg | Freq: Once | INTRAVENOUS | Status: AC
  Administered 2023-09-25: 10 mg via INTRAVENOUS
  Filled 2023-09-25: qty 10

## 2023-09-25 MED ORDER — SODIUM CHLORIDE 0.9% FLUSH: 10.0000 mL | INTRAVENOUS | Status: DC

## 2023-09-25 MED ORDER — SODIUM CHLORIDE 0.9 % IV SOLN
2400.0000 mg/m2 | INTRAVENOUS | Status: DC
  Administered 2023-09-25: 5000 mg via INTRAVENOUS
  Filled 2023-09-25: qty 100

## 2023-09-25 MED ORDER — SODIUM CHLORIDE 0.9 % IV SOLN
400.0000 mg | Freq: Once | INTRAVENOUS | Status: AC
  Administered 2023-09-25: 400 mg via INTRAVENOUS
  Filled 2023-09-25: qty 16

## 2023-09-25 MED ORDER — SODIUM CHLORIDE 0.9 % IV SOLN
180.0000 mg/m2 | Freq: Once | INTRAVENOUS | Status: AC
  Administered 2023-09-25: 360 mg via INTRAVENOUS
  Filled 2023-09-25: qty 18

## 2023-09-25 MED ORDER — SODIUM CHLORIDE 0.9 % IV SOLN: Freq: Once | INTRAVENOUS | Status: AC

## 2023-09-25 MED ORDER — SODIUM CHLORIDE 0.9% FLUSH: 10.0000 mL | INTRAVENOUS | Status: DC | PRN

## 2023-09-25 MED ORDER — PALONOSETRON HCL INJECTION 0.25 MG/5ML
0.2500 mg | Freq: Once | INTRAVENOUS | Status: AC
  Administered 2023-09-25: 0.25 mg via INTRAVENOUS
  Filled 2023-09-25: qty 5

## 2023-09-25 NOTE — Patient Instructions (Signed)
[facility]  Discharge Instructions: Thank you for choosing [facility] to provide your oncology and hematology care.  If you have a lab appointment with the Cancer Center - please note that after April 8th, 2024, all labs will be drawn in the cancer center.  You do not have to check in or register with the main entrance as you have in the past but will complete your check-in in the cancer center.  Wear comfortable clothing and clothing appropriate for easy access to any Portacath or PICC line.   We strive to give you quality time with your provider. You may need to reschedule your appointment if you arrive late (15 or more minutes).  Arriving late affects you and other patients whose appointments are after yours.  Also, if you miss three or more appointments without notifying the office, you may be dismissed from the clinic at the provider's discretion.      For prescription refill requests, have your pharmacy contact our office and allow 72 hours for refills to be completed.    Today you received the following chemotherapy and/or immunotherapy agents Avastin/FOLFIRI with 5FU pump start.      To help prevent nausea and vomiting after your treatment, we encourage you to take your nausea medication as directed.  BELOW ARE SYMPTOMS THAT SHOULD BE REPORTED IMMEDIATELY: *FEVER GREATER THAN 100.4 F (38 C) OR HIGHER *CHILLS OR SWEATING *NAUSEA AND VOMITING THAT IS NOT CONTROLLED WITH YOUR NAUSEA MEDICATION *UNUSUAL SHORTNESS OF BREATH *UNUSUAL BRUISING OR BLEEDING *URINARY PROBLEMS (pain or burning when urinating, or frequent urination) *BOWEL PROBLEMS (unusual diarrhea, constipation, pain near the anus) TENDERNESS IN MOUTH AND THROAT WITH OR WITHOUT PRESENCE OF ULCERS (sore throat, sores in mouth, or a toothache) UNUSUAL RASH, SWELLING OR PAIN  UNUSUAL VAGINAL DISCHARGE OR ITCHING   Items with * indicate a potential emergency and should be followed up as soon  as possible or go to the Emergency Department if any problems should occur.  Please show the CHEMOTHERAPY ALERT CARD or IMMUNOTHERAPY ALERT CARD at check-in to the Emergency Department and triage nurse.  Should you have questions after your visit or need to cancel or reschedule your appointment, please contact [facility]ENTER AT [facility] [phone]  and follow the prompts.  Office hours are 8:00 a.m. to 4:30 p.m. Monday - Friday. Please note that voicemails left after 4:00 p.m. may not be returned until the following business day.  We are closed weekends and major holidays. You have access to a nurse at all times for urgent questions. Please call the main number to the clinic [phone] and follow the prompts.  For any non-urgent questions, you may also contact your provider using MyChart. We now offer e-Visits for anyone 72 and older to request care online for non-urgent symptoms. For details visit [email or web address].   Also download the MyChart app! Go to the app store, search "MyChart", open the app, select [place], and log in with your MyChart username and password.

## 2023-09-25 NOTE — Progress Notes (Signed)
Patient presents today for Avastin/Folfiri infusions with 5FU pump start per providers order.  Vital signs and labs within parameters for treatment.  Patient has no new complaints at this time.  Treatment given today per MD orders.  Tolerated infusion without adverse affects.  Vital signs stable.  5FU pump connected and verified RUN on the screen with the patient. No complaints at this time.  Discharge from clinic ambulatory in stable condition.  Alert and oriented X 3.  Follow up with [facility] as scheduled.

## 2023-09-27 ENCOUNTER — Inpatient Hospital Stay: Payer: Medicare Other

## 2023-09-27 VITALS — BP 137/88 | HR 72 | Temp 97.0°F | Resp 20

## 2023-09-27 DIAGNOSIS — C180 Malignant neoplasm of cecum: Secondary | ICD-10-CM

## 2023-09-27 DIAGNOSIS — Z95828 Presence of other vascular implants and grafts: Secondary | ICD-10-CM

## 2023-09-27 DIAGNOSIS — Z87891 Personal history of nicotine dependence: Secondary | ICD-10-CM | POA: Diagnosis not present

## 2023-09-27 DIAGNOSIS — Z5112 Encounter for antineoplastic immunotherapy: Secondary | ICD-10-CM | POA: Diagnosis not present

## 2023-09-27 DIAGNOSIS — C7801 Secondary malignant neoplasm of right lung: Secondary | ICD-10-CM | POA: Diagnosis not present

## 2023-09-27 DIAGNOSIS — C7802 Secondary malignant neoplasm of left lung: Secondary | ICD-10-CM | POA: Diagnosis not present

## 2023-09-27 DIAGNOSIS — Z5111 Encounter for antineoplastic chemotherapy: Secondary | ICD-10-CM | POA: Diagnosis not present

## 2023-09-27 DIAGNOSIS — C7972 Secondary malignant neoplasm of left adrenal gland: Secondary | ICD-10-CM | POA: Diagnosis not present

## 2023-09-27 DIAGNOSIS — C782 Secondary malignant neoplasm of pleura: Secondary | ICD-10-CM | POA: Diagnosis not present

## 2023-09-27 DIAGNOSIS — Z5189 Encounter for other specified aftercare: Secondary | ICD-10-CM | POA: Diagnosis not present

## 2023-09-27 DIAGNOSIS — Z79899 Other long term (current) drug therapy: Secondary | ICD-10-CM | POA: Diagnosis not present

## 2023-09-27 MED ORDER — SODIUM CHLORIDE 0.9% FLUSH
10.0000 mL | INTRAVENOUS | Status: DC | PRN
  Administered 2023-09-27: 10 mL

## 2023-09-27 MED ORDER — PEGFILGRASTIM-CBQV 6 MG/0.6ML ~~LOC~~ SOSY
6.0000 mg | PREFILLED_SYRINGE | Freq: Once | SUBCUTANEOUS | Status: AC
  Administered 2023-09-27: 6 mg via SUBCUTANEOUS
  Filled 2023-09-27: qty 0.6

## 2023-09-27 MED ORDER — HEPARIN SOD (PORK) LOCK FLUSH 100 UNIT/ML IV SOLN
500.0000 [IU] | Freq: Once | INTRAVENOUS | Status: AC | PRN
  Administered 2023-09-27: 500 [IU]

## 2023-09-27 NOTE — Patient Instructions (Signed)
You had your pump d/c today.  You also got your Udenyca injection.  Follow up in 2 weeks as scheduled.

## 2023-09-27 NOTE — Progress Notes (Signed)
Patient presents today for pump d/c and udenyca injection per MD orders.  He denies having any issues over the last 2 days.  His pump was off when he arrived.  He had zero left to infuse.  His pump was d/c and port flushed without incidence.  Patient received udenyca injection in right upper arm.  He tolerated treatment well without incidence. He was discharged ambulatory and in stable condition from clinic.  He will follow up as scheduled.

## 2023-09-28 LAB — CBC WITH DIFFERENTIAL/PLATELET
Abs Immature Granulocytes: 0.8 K/uL — ABNORMAL HIGH (ref 0.00–0.07)
Band Neutrophils: 2 %
Basophils Absolute: 0 K/uL (ref 0.0–0.1)
Basophils Relative: 0 %
Eosinophils Absolute: 0 K/uL (ref 0.0–0.5)
Eosinophils Relative: 0 %
HCT: 34.8 % — ABNORMAL LOW (ref 39.0–52.0)
Hemoglobin: 11.6 g/dL — ABNORMAL LOW (ref 13.0–17.0)
Lymphocytes Relative: 19 %
Lymphs Abs: 3.1 K/uL (ref 0.7–4.0)
MCH: 32.6 pg (ref 26.0–34.0)
MCHC: 33.3 g/dL (ref 30.0–36.0)
MCV: 97.8 fL (ref 80.0–100.0)
Metamyelocytes Relative: 3 %
Monocytes Absolute: 1 K/uL (ref 0.1–1.0)
Monocytes Relative: 6 %
Myelocytes: 2 %
Neutro Abs: 11.3 K/uL — ABNORMAL HIGH (ref 1.7–7.7)
Neutrophils Relative %: 68 %
Platelets: 158 K/uL (ref 150–400)
RBC: 3.56 MIL/uL — ABNORMAL LOW (ref 4.22–5.81)
RDW: 17.1 % — ABNORMAL HIGH (ref 11.5–15.5)
WBC: 16.2 K/uL — ABNORMAL HIGH (ref 4.0–10.5)
nRBC: 0.4 % — ABNORMAL HIGH (ref 0.0–0.2)
nRBC: 3 /100{WBCs} — ABNORMAL HIGH

## 2023-10-01 ENCOUNTER — Other Ambulatory Visit: Payer: Self-pay

## 2023-10-01 MED ORDER — MISC. DEVICES MISC: 3 refills | Status: DC

## 2023-10-08 NOTE — Progress Notes (Signed)
[facility] [address], [place] [address]    Clinic Day:  10/09/2023  Referring physician: [name], PA  Patient Care Team: [name], [name] as PCP - General (Physician Assistant) [name], MD as Medical Oncologist (Medical Oncology) Therese Sarah, RN as Oncology Nurse Navigator (Medical Oncology)   ASSESSMENT & PLAN:   Assessment: 1. Left lung mass with multiple lung nodules: - Presentation with dry cough for 6 months. - 30 pound weight loss in the last couple of years, weight stable over the last 6 months. - CT chest with contrast on 12/16/2021 showed bulky left hilar mass measuring 8.1 x 7.5 cm.  Numerous bilateral lung nodules of varying sizes.  Left adrenal nodule measuring 2.8 x 2.2 cm.  Mediastinal and bilateral hilar adenopathy with the largest pretracheal node measuring 4.1 x 3.1 cm. - MRI of the brain from 01/04/2022 which was negative for metastatic disease. - CTAP from 01/03/2022 which showed isolated left adrenal metastasis with no other evidence of metastatic disease in the abdomen or pelvis. - Pathology of left lung biopsy which shows adenocarcinoma with necrosis.  CK20 positive and CDX2 positive but negative for CK7 indicating colonic primary. 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  HER2 negative.  TMB low.  MSI-stable.  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  Other targetable mutations negative. - FOLFIRI started on 02/22/2022, bevacizumab added with cycle 4  - CT CAP (05/17/2022): Mediastinal and hilar lymph nodes have decreased in size.  Largest perihilar left lower lobe lung mass has decreased in size.  Right upper lobe mass slightly increased in size.  Other nodules are stable.  Left adrenal mass decreased to 1.3 cm from 2.8 cm. - CT scan showed mixed response as it was compared to CT from 12/16/2021.  He did not start chemotherapy until 02/22/2022. - Maintenance 5-FU and bevacizumab from September 2023 through 08/29/2023 with  progression - FOLFIRI and bevacizumab started on 09/12/2023   2. Social/family history: - Lives by himself.  He paints houses for living. - He quit smoking 12 years back and started back again 1 and half year ago and smoked half pack per day.  He quit again about 1 week ago. - Father had cancer, type unknown to the patient.  Brother died of brain tumor.  3.  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  Margins negative.  No lymphovascular or perineural invasion. - Received 3 cycles of XELOX followed by Xeloda for total of 6 months.  Oxaliplatin discontinued during cycle 4 secondary to transaminitis, elevated bilirubin and thrombocytopenia.  However he was also treated for hep C with Harvoni after that.    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 - He had mucositis last week.  Magic mouthwash helped. - Reviewed labs today: Normal LFTs.  CBC grossly normal.  White count is elevated from G-CSF.  UA shows protein 30. - Recommend continuing FOLFIRI and bevacizumab every 2 weeks.  RTC 6 weeks for follow-up.  He wants to go to [place] for work for couple of weeks in November.  He will let us know.  2.  Lower rib/epigastric pain: - Continue oxycodone 10 mg every 4 hours as needed.  3.  Difficulty falling asleep: - Continue trazodone as needed.  Not requiring on regular basis.  4.  Hypertension: - Continue amlodipine 10 mg daily.  Blood pressure is better controlled.  Addendum: - He developed nausea and vomiting and abdominal cramping during irinotecan infusion. - We have given Ativan and atropine which helped. - We will add Emend to the premeds.  Will also add Ativan 1 mg to premeds starting next cycle.    Orders Placed This Encounter  Procedures   CEA    Standing Status:   Future    Standing  Expiration Date:   11/05/2024   CEA    Standing Status:   Future    Standing Expiration Date:   12/03/2024   CEA    Standing Status:   Future    Standing Expiration Date:   10/22/2024      Mikeal Hawthorne R Teague,acting as a scribe for [name], MD.,have documented all relevant documentation on the behalf of [name], MD,as directed by  [name], MD while in the presence of [name], MD.  I, [name] MD, have reviewed the above documentation for accuracy and completeness, and I agree with the above.     [name], MD   10/29/20246:21 PM  CHIEF COMPLAINT:   Diagnosis: metastatic colon cancer to the lungs and left adrenal gland    Cancer Staging  Cecal cancer [facility]) Staging form: Colon and Rectum, AJCC 8th Edition - Clinical stage from 10/07/2019: Stage IIIB (cT3, cN1, cM0) - Signed by [name], MD on 10/07/2019 - Pathologic stage from 01/23/2022: Stage IVB (rpTX, pN0, pM1b) - Unsigned    Prior Therapy: FOLFIRI 02/22/22 - 08/08/22   Current Therapy:  maintenance 5-FU + bevacizumab    HISTORY OF PRESENT ILLNESS:   Oncology History  Cecal cancer (HCC)  10/07/2019 Initial Diagnosis   Cecal cancer (HCC)   10/07/2019 Cancer Staging   Staging form: Colon and Rectum, AJCC 8th Edition - Clinical stage from 10/07/2019: Stage IIIB (cT3, cN1, cM0) - Signed by [name], MD on 10/07/2019   02/22/2022 - 08/08/2022 Chemotherapy   Patient is on Treatment Plan : COLORECTAL FOLFIRI / BEVACIZUMAB Q14D     02/22/2022 -  Chemotherapy   Patient is on Treatment Plan : COLORECTAL FOLFIRI + Bevacizumab q14d        INTERVAL HISTORY:   Evan [name] is a 67 y.o. male presenting to clinic today for follow up of metastatic colon cancer to the lungs and left adrenal gland. He was last seen by me on 09/12/23.  Today, he states that he is doing well overall. His appetite level is at 50%. His energy level is at 50%.  He  reports mouth soreness with mucositis and intermittent pain throughout the body after his last cycle of chemotherapy. Mouth soreness resolved with mouth wash. He also has chest pain that comes and goes, as well as difficulty swallowing. He is still experiencing chest pain at this visit, though it is not as intense as last week. He occasionally took 2 oxycodone due to severity of pain and would like a refill of pain medication.   Evan [name] is planning to travel to the [place] for 2 weeks and asked when would be the best time to do so.   PAST MEDICAL HISTORY:   Past Medical History: Past Medical History:  Diagnosis Date   Arthritis    Colon cancer (HCC)    colon   Hepatitis C    Hypertension    Port-A-Cath in place 02/15/2022    Surgical History: Past Surgical History:  Procedure Laterality Date  BIOPSY  03/12/2023   Procedure: BIOPSY;  Surgeon: Lanelle Bal, DO;  [facility]: [facility];  Service: Endoscopy;;   COLON SURGERY     ESOPHAGEAL BANDING N/A 03/12/2023   Procedure: ESOPHAGEAL BANDING;  Surgeon: Lanelle Bal, DO;  Location: [facility];  Service: Endoscopy;  Laterality: N/A;   ESOPHAGOGASTRODUODENOSCOPY (EGD) WITH PROPOFOL N/A 03/12/2023   Procedure: ESOPHAGOGASTRODUODENOSCOPY (EGD) WITH PROPOFOL;  Surgeon: Lanelle Bal, DO;  Location: [facility];  Service: Endoscopy;  Laterality: N/A;  11:30AM; ASA 3   PORTACATH PLACEMENT Right 02/08/2022   Procedure: INSERTION PORT-A-CATH- RIJ;  Surgeon: Lewie Chamber, DO;  Location: [facility];  Service: General;  Laterality: Right;   REPLACEMENT TOTAL KNEE Left    SHOULDER ARTHROSCOPY Bilateral    TOTAL HIP ARTHROPLASTY Right 11/09/2020   Procedure: TOTAL HIP ARTHROPLASTY ANTERIOR APPROACH;  Surgeon: Sheral Apley, MD;  Location: [facility];  Service: Orthopedics;  Laterality: Right;   WRIST SURGERY Left     Social History: Social History   Socioeconomic History   Marital status: Married    Spouse name:  Not on file   Number of children: 7   Years of education: Not on file   Highest education level: Not on file  Occupational History   Occupation: employed  Tobacco Use   Smoking status: Former    Current packs/day: 0.00    Average packs/day: 1.5 packs/day for 20.0 years (30.0 ttl pk-yrs)    Types: Cigarettes    Start date: 11/19/1985    Quit date: 11/19/2005    Years since quitting: 17.8   Smokeless tobacco: Never  Vaping Use   Vaping status: Never Used  Substance and Sexual Activity   Alcohol use: Not Currently   Drug use: Never   Sexual activity: Yes  Other Topics Concern   Not on file  Social History Narrative   ** Merged History Encounter **       Separated from wife 11/2021   Social Determinants of Health   Financial Resource Strain: Low Risk  (10/07/2019)   Overall Financial Resource Strain (CARDIA)    Difficulty of Paying Living Expenses: Not very hard  Food Insecurity: No Food Insecurity (10/07/2019)   Hunger Vital Sign    Worried About Running Out of Food in the Last Year: Never true    Ran Out of Food in the Last Year: Never true  Transportation Needs: No Transportation Needs (10/07/2019)   PRAPARE - Administrator, Civil Service (Medical): No    Lack of Transportation (Non-Medical): No  Physical Activity: Inactive (10/07/2019)   Exercise Vital Sign    Days of Exercise per Week: 0 days    Minutes of Exercise per Session: 0 min  Stress: No Stress Concern Present (10/07/2019)   [employer] of Occupational Health - Occupational Stress Questionnaire    Feeling of Stress : Not at all  Social Connections: Moderately Integrated (10/07/2019)   Social Connection and Isolation Panel [NHANES]    Frequency of Communication with Friends and Family: Once a week    Frequency of Social Gatherings with Friends and Family: Once a week    Attends Religious Services: More than 4 times per year    Active Member of [employer] or Organizations: Yes    Attends Occupational hygienist Meetings: More than 4 times per year    Marital Status: Married  Catering manager Violence: Not At Risk (10/07/2019)   Humiliation, Afraid, Rape, and Kick questionnaire  Fear of Current or Ex-Partner: No    Emotionally Abused: No    Physically Abused: No    Sexually Abused: No    Family History: Family History  Problem Relation Age of Onset   Heart disease Mother    Dementia Father    Heart disease Sister    Cancer Brother    Hypertension Brother    Hypertension Brother    Healthy Son    Healthy Son    Healthy Son    Healthy Son    Healthy Daughter    Healthy Daughter    Healthy Daughter     Current Medications:  Current Outpatient Medications:    aluminum-magnesium hydroxide-simethicone (MAALOX) 200-200-20 MG/5ML SUSP, Take 30 mLs by mouth 4 (four) times daily -  before meals and at bedtime., Disp: 1680 mL, Rfl: 2   amLODipine (NORVASC) 10 MG tablet, Take 1 tablet (10 mg total) by mouth daily., Disp: 90 tablet, Rfl: 3   Bevacizumab (AVASTIN IV), Inject into the vein every 14 (fourteen) days. *start date TBD, Disp: , Rfl:    ENULOSE 10 GM/15ML SOLN, Take 10 g by mouth daily as needed., Disp: , Rfl:    fluorouracil CALGB 62952 2,400 mg/m2 in sodium chloride 0.9 % 150 mL, Inject 2,400 mg/m2 into the vein over 48 hr., Disp: , Rfl:    FLUOROURACIL IV, Inject into the vein every 14 (fourteen) days., Disp: , Rfl:    Lactulose 20 GM/30ML SOLN, Take 15 mLs (10 g total) by mouth at bedtime. Take 15 ml at bedtime every night to assist with regular bowel movements.  Titrate down if having multiple bowel movements.  If a bowel movement has not occurred in 3 to 4 days or longer, then take 15 ml every 3 hours until a bowel movent has occurred., Disp: 450 mL, Rfl: 5   LEUCOVORIN CALCIUM IV, Inject into the vein every 14 (fourteen) days., Disp: , Rfl:    megestrol (MEGACE) 400 MG/10ML suspension, Take 10 mLs (400 mg total) by mouth 2 (two) times daily., Disp: 480 mL, Rfl: 3    Misc. Devices MISC, Please provide patient with 1:1 ratio of magic mouthwash and lidocaine to swish and swallow QID, Disp: 1 each, Rfl: 3   pantoprazole (PROTONIX) 40 MG tablet, Take 1 tablet (40 mg total) by mouth daily., Disp: 30 tablet, Rfl: 11   potassium chloride SA (KLOR-CON M) 20 MEQ tablet, Take 1 tablet (20 mEq total) by mouth daily., Disp: 90 tablet, Rfl: 4   Oxycodone HCl 10 MG TABS, Take 1 tablet (10 mg total) by mouth every 4 (four) hours as needed., Disp: 180 tablet, Rfl: 0 No current facility-administered medications for this visit.  Facility-Administered Medications Ordered in Other Visits:    fluorouracil (ADRUCIL) 5,000 mg in sodium chloride 0.9 % 150 mL chemo infusion, 2,400 mg/m2 (Treatment Plan Recorded), Intravenous, 1 day or 1 dose, [name], MD, Infusion Verify at 10/09/23 1445   sodium chloride flush (NS) 0.9 % injection 10 mL, 10 mL, Intracatheter, PRN, [name], MD   Allergies: No Known Allergies  REVIEW OF SYSTEMS:   Review of Systems  Constitutional:  Negative for chills, fatigue and fever.  HENT:   Positive for trouble swallowing. Negative for lump/mass, mouth sores, nosebleeds and sore throat.   Eyes:  Negative for eye problems.  Respiratory:  Positive for cough. Negative for shortness of breath.   Cardiovascular:  Positive for chest pain (6/10 severity). Negative for leg swelling and palpitations.  Gastrointestinal:  Negative for abdominal pain, constipation, diarrhea, nausea and vomiting.  Genitourinary:  Negative for bladder incontinence, difficulty urinating, dysuria, frequency, hematuria and nocturia.   Musculoskeletal:  Positive for arthralgias (throughout the body, 6/10 severity). Negative for back pain, flank pain, myalgias and neck pain.  Skin:  Negative for itching and rash.  Neurological:  Negative for dizziness, headaches and numbness.  Hematological:  Does not bruise/bleed easily.  Psychiatric/Behavioral:  Positive for  sleep disturbance. Negative for depression and suicidal ideas. The patient is not nervous/anxious.   All other systems reviewed and are negative.    VITALS:   Weight 186 lb 1.6 oz (84.4 kg).  Wt Readings from Last 3 Encounters:  10/09/23 186 lb 1.6 oz (84.4 kg)  09/25/23 190 lb 4.8 oz (86.3 kg)  09/12/23 197 lb 3.2 oz (89.4 kg)    Body mass index is 26.7 kg/m.  Performance status (ECOG): 1 - Symptomatic but completely ambulatory  PHYSICAL EXAM:   Physical Exam Vitals and nursing note reviewed. Exam conducted with a chaperone present.  Constitutional:      Appearance: Normal appearance.  Cardiovascular:     Rate and Rhythm: Normal rate and regular rhythm.     Pulses: Normal pulses.     Heart sounds: Normal heart sounds.  Pulmonary:     Effort: Pulmonary effort is normal.     Breath sounds: Normal breath sounds.  Abdominal:     Palpations: Abdomen is soft. There is no hepatomegaly, splenomegaly or mass.     Tenderness: There is no abdominal tenderness.  Musculoskeletal:     Right lower leg: No edema.     Left lower leg: No edema.  Lymphadenopathy:     Cervical: No cervical adenopathy.     Right cervical: No superficial, deep or posterior cervical adenopathy.    Left cervical: No superficial, deep or posterior cervical adenopathy.     Upper Body:     Right upper body: No supraclavicular or axillary adenopathy.     Left upper body: No supraclavicular or axillary adenopathy.  Neurological:     General: No focal deficit present.     Mental Status: He is alert and oriented to person, place, and time.  Psychiatric:        Mood and Affect: Mood normal.        Behavior: Behavior normal.     LABS:      Latest Ref Rng & Units 10/09/2023    8:22 AM 09/25/2023    8:36 AM 09/12/2023    8:31 AM  CBC  WBC 4.0 - 10.5 K/uL 18.2  16.2  6.9   Hemoglobin 13.0 - 17.0 g/dL 84.6  96.2  95.2   Hematocrit 39.0 - 52.0 % 32.0  34.8  35.5   Platelets 150 - 400 K/uL 186  158  218        Latest Ref Rng & Units 10/09/2023    8:22 AM 09/25/2023    8:36 AM 09/12/2023    8:31 AM  CMP  Glucose 70 - 99 mg/dL 841  324  401   BUN 8 - 23 mg/dL 13  13  10    Creatinine 0.61 - 1.24 mg/dL 0.27  2.53  6.64   Sodium 135 - 145 mmol/L 134  133  134   Potassium 3.5 - 5.1 mmol/L 3.4  3.7  3.7   Chloride 98 - 111 mmol/L 106  103  102   CO2 22 - 32 mmol/L 19  21  24  Calcium 8.9 - 10.3 mg/dL 8.7  8.7  9.1   Total Protein 6.5 - 8.1 g/dL 7.3  7.4  7.4   Total Bilirubin 0.3 - 1.2 mg/dL 0.4  0.5  0.8   Alkaline Phos 38 - 126 U/L 68  59  36   AST 15 - 41 U/L 26  22  19    ALT 0 - 44 U/L 17  10  9       Lab Results  Component Value Date   CEA1 124.0 (H) 08/29/2023   /  CEA  Date Value Ref Range Status  08/29/2023 124.0 (H) 0.0 - 4.7 ng/mL Final    Comment:    (NOTE)                             Nonsmokers          <3.9                             Smokers             <5.6 Roche [employer] Electrochemiluminescence Immunoassay (ECLIA) Values obtained with different assay methods or kits cannot be used interchangeably.  Results cannot be interpreted as absolute evidence of the presence or absence of malignant disease. Performed At: [facility] [address] [place], [place] [id number] Jolene Schimke MD [id number]    No results found for: "PSA1" No results found for: "CAN199" No results found for: "CAN125"  No results found for: "TOTALPROTELP", "ALBUMINELP", "A1GS", "A2GS", "BETS", "BETA2SER", "GAMS", "MSPIKE", "SPEI" Lab Results  Component Value Date   TIBC 406 01/16/2023   TIBC 294 06/06/2022   TIBC 237 (L) 02/22/2022   FERRITIN 150 01/16/2023   FERRITIN 243 06/06/2022   FERRITIN 292 02/22/2022   IRONPCTSAT 18 01/16/2023   IRONPCTSAT 24 06/06/2022   IRONPCTSAT 11 (L) 02/22/2022   Lab Results  Component Value Date   LDH 258 (H) 12/26/2021     STUDIES:   No results found.

## 2023-10-09 ENCOUNTER — Encounter: Payer: Self-pay | Admitting: Hematology

## 2023-10-09 ENCOUNTER — Other Ambulatory Visit: Payer: Self-pay | Admitting: *Deleted

## 2023-10-09 ENCOUNTER — Inpatient Hospital Stay (HOSPITAL_BASED_OUTPATIENT_CLINIC_OR_DEPARTMENT_OTHER): Payer: Medicare Other | Admitting: Hematology

## 2023-10-09 ENCOUNTER — Other Ambulatory Visit: Payer: Self-pay

## 2023-10-09 ENCOUNTER — Inpatient Hospital Stay: Payer: Medicare Other

## 2023-10-09 ENCOUNTER — Ambulatory Visit: Payer: Medicare Other | Admitting: Hematology

## 2023-10-09 ENCOUNTER — Ambulatory Visit: Payer: Medicare Other

## 2023-10-09 VITALS — BP 121/70 | HR 72 | Temp 96.6°F | Resp 18

## 2023-10-09 VITALS — Wt 186.1 lb

## 2023-10-09 DIAGNOSIS — Z5189 Encounter for other specified aftercare: Secondary | ICD-10-CM | POA: Diagnosis not present

## 2023-10-09 DIAGNOSIS — Z79899 Other long term (current) drug therapy: Secondary | ICD-10-CM | POA: Diagnosis not present

## 2023-10-09 DIAGNOSIS — C7972 Secondary malignant neoplasm of left adrenal gland: Secondary | ICD-10-CM | POA: Diagnosis not present

## 2023-10-09 DIAGNOSIS — Z95828 Presence of other vascular implants and grafts: Secondary | ICD-10-CM

## 2023-10-09 DIAGNOSIS — C180 Malignant neoplasm of cecum: Secondary | ICD-10-CM

## 2023-10-09 DIAGNOSIS — Z87891 Personal history of nicotine dependence: Secondary | ICD-10-CM | POA: Diagnosis not present

## 2023-10-09 DIAGNOSIS — C782 Secondary malignant neoplasm of pleura: Secondary | ICD-10-CM | POA: Diagnosis not present

## 2023-10-09 DIAGNOSIS — C7801 Secondary malignant neoplasm of right lung: Secondary | ICD-10-CM | POA: Diagnosis not present

## 2023-10-09 DIAGNOSIS — C7802 Secondary malignant neoplasm of left lung: Secondary | ICD-10-CM | POA: Diagnosis not present

## 2023-10-09 DIAGNOSIS — Z5111 Encounter for antineoplastic chemotherapy: Secondary | ICD-10-CM | POA: Diagnosis not present

## 2023-10-09 DIAGNOSIS — Z5112 Encounter for antineoplastic immunotherapy: Secondary | ICD-10-CM | POA: Diagnosis not present

## 2023-10-09 LAB — CBC WITH DIFFERENTIAL/PLATELET
Abs Immature Granulocytes: 0.7 10*3/uL — ABNORMAL HIGH (ref 0.00–0.07)
Band Neutrophils: 8 %
Basophils Absolute: 0 10*3/uL (ref 0.0–0.1)
Basophils Relative: 0 %
Eosinophils Absolute: 0 10*3/uL (ref 0.0–0.5)
Eosinophils Relative: 0 %
HCT: 32 % — ABNORMAL LOW (ref 39.0–52.0)
Hemoglobin: 10.3 g/dL — ABNORMAL LOW (ref 13.0–17.0)
Lymphocytes Relative: 21 %
Lymphs Abs: 3.8 10*3/uL (ref 0.7–4.0)
MCH: 32.3 pg (ref 26.0–34.0)
MCHC: 32.2 g/dL (ref 30.0–36.0)
MCV: 100.3 fL — ABNORMAL HIGH (ref 80.0–100.0)
Metamyelocytes Relative: 3 %
Monocytes Absolute: 0.7 10*3/uL (ref 0.1–1.0)
Monocytes Relative: 4 %
Myelocytes: 1 %
Neutro Abs: 12.6 10*3/uL — ABNORMAL HIGH (ref 1.7–7.7)
Neutrophils Relative %: 61 %
Other: 2 %
Platelets: 186 10*3/uL (ref 150–400)
RBC: 3.19 MIL/uL — ABNORMAL LOW (ref 4.22–5.81)
RDW: 15.9 % — ABNORMAL HIGH (ref 11.5–15.5)
WBC: 18.2 10*3/uL — ABNORMAL HIGH (ref 4.0–10.5)
nRBC: 0.3 % — ABNORMAL HIGH (ref 0.0–0.2)
nRBC: 1 /100{WBCs} — ABNORMAL HIGH

## 2023-10-09 LAB — COMPREHENSIVE METABOLIC PANEL
ALT: 17 U/L (ref 0–44)
AST: 26 U/L (ref 15–41)
Albumin: 3.5 g/dL (ref 3.5–5.0)
Alkaline Phosphatase: 68 U/L (ref 38–126)
Anion gap: 9 (ref 5–15)
BUN: 13 mg/dL (ref 8–23)
CO2: 19 mmol/L — ABNORMAL LOW (ref 22–32)
Calcium: 8.7 mg/dL — ABNORMAL LOW (ref 8.9–10.3)
Chloride: 106 mmol/L (ref 98–111)
Creatinine, Ser: 1.21 mg/dL (ref 0.61–1.24)
GFR, Estimated: >60 mL/min (ref >60)
Glucose, Bld: 113 mg/dL — ABNORMAL HIGH (ref 70–99)
Potassium: 3.4 mmol/L — ABNORMAL LOW (ref 3.5–5.1)
Sodium: 134 mmol/L — ABNORMAL LOW (ref 135–145)
Total Bilirubin: 0.4 mg/dL (ref 0.3–1.2)
Total Protein: 7.3 g/dL (ref 6.5–8.1)

## 2023-10-09 LAB — URINALYSIS, DIPSTICK ONLY
Bilirubin Urine: NEGATIVE
Glucose, UA: NEGATIVE mg/dL
Hgb urine dipstick: NEGATIVE
Ketones, ur: NEGATIVE mg/dL
Leukocytes,Ua: NEGATIVE
Nitrite: NEGATIVE
Protein, ur: 30 mg/dL — AB
Specific Gravity, Urine: 1.021 (ref 1.005–1.030)
pH: 5 (ref 5.0–8.0)

## 2023-10-09 LAB — PATHOLOGIST SMEAR REVIEW

## 2023-10-09 LAB — MAGNESIUM: Magnesium: 1.9 mg/dL (ref 1.7–2.4)

## 2023-10-09 MED ORDER — DEXAMETHASONE SODIUM PHOSPHATE 10 MG/ML IJ SOLN
10.0000 mg | Freq: Once | INTRAMUSCULAR | Status: AC
  Administered 2023-10-09: 10 mg via INTRAVENOUS
  Filled 2023-10-09: qty 1

## 2023-10-09 MED ORDER — SODIUM CHLORIDE 0.9% FLUSH
10.0000 mL | Freq: Once | INTRAVENOUS | Status: AC
  Administered 2023-10-09: 10 mL via INTRAVENOUS

## 2023-10-09 MED ORDER — FLUOROURACIL CHEMO INJECTION 2.5 GM/50ML
400.0000 mg/m2 | Freq: Once | INTRAVENOUS | Status: AC
  Administered 2023-10-09: 800 mg via INTRAVENOUS
  Filled 2023-10-09: qty 16

## 2023-10-09 MED ORDER — SODIUM CHLORIDE 0.9 % IV SOLN
180.0000 mg/m2 | Freq: Once | INTRAVENOUS | Status: AC
  Administered 2023-10-09: 360 mg via INTRAVENOUS
  Filled 2023-10-09: qty 18

## 2023-10-09 MED ORDER — SODIUM CHLORIDE 0.9 % IV SOLN
5.0000 mg/kg | Freq: Once | INTRAVENOUS | Status: AC
  Administered 2023-10-09: 400 mg via INTRAVENOUS
  Filled 2023-10-09: qty 16

## 2023-10-09 MED ORDER — SODIUM CHLORIDE 0.9% FLUSH: 10.0000 mL | INTRAVENOUS | Status: DC | PRN

## 2023-10-09 MED ORDER — PALONOSETRON HCL INJECTION 0.25 MG/5ML
0.2500 mg | Freq: Once | INTRAVENOUS | Status: AC
  Administered 2023-10-09: 0.25 mg via INTRAVENOUS
  Filled 2023-10-09: qty 5

## 2023-10-09 MED ORDER — SODIUM CHLORIDE 0.9 % IV SOLN
2400.0000 mg/m2 | INTRAVENOUS | Status: DC
  Administered 2023-10-09: 5000 mg via INTRAVENOUS
  Filled 2023-10-09: qty 100

## 2023-10-09 MED ORDER — SODIUM CHLORIDE 0.9 % IV SOLN
400.0000 mg/m2 | Freq: Once | INTRAVENOUS | Status: AC
  Administered 2023-10-09: 780 mg via INTRAVENOUS
  Filled 2023-10-09: qty 39

## 2023-10-09 MED ORDER — OXYCODONE HCL 10 MG PO TABS: 10.0000 mg | ORAL_TABLET | ORAL | 0 refills | Status: DC | PRN

## 2023-10-09 MED ORDER — SODIUM CHLORIDE 0.9 % IV SOLN: Freq: Once | INTRAVENOUS | Status: AC

## 2023-10-09 MED ORDER — ATROPINE SULFATE 1 MG/ML IV SOLN
1.0000 mg | Freq: Once | INTRAVENOUS | Status: AC
  Administered 2023-10-09: 1 mg via INTRAVENOUS
  Filled 2023-10-09: qty 1

## 2023-10-09 MED ORDER — LORAZEPAM 2 MG/ML IJ SOLN
0.5000 mg | Freq: Once | INTRAMUSCULAR | Status: AC
  Administered 2023-10-09: 0.5 mg via INTRAVENOUS
  Filled 2023-10-09: qty 1

## 2023-10-09 NOTE — Progress Notes (Signed)
Patient has been examined by Dr. Katragadda. Vital signs and labs have been reviewed by MD - ANC, Creatinine, LFTs, hemoglobin, and platelets are within treatment parameters per M.D. - pt may proceed with treatment.  Primary RN and pharmacy notified.  

## 2023-10-09 NOTE — Progress Notes (Signed)
1345-patient complained of abdominal cramping.  Irinotecan stopped and normal saline bolus started.   Katragadda notified with verbal order Atropine 1 mg IV times one.  See MAR for details.   1347-Patient found in the bathroom with nausea and bile colored vomit in toilet.  Verbal order for Ativan 0.5 mg IV per Dr. Ellin Saba. See MAR for details.    1350-patient having diarrhea. See flowsheet for vital signs.    1409-patient resting in chair in reclining position.  Stated abdominal cramping and nausea better.    Patient tolerated chemotherapy with no complaints voiced.  Side effects with management reviewed with understanding verbalized.  Port site clean and dry with no bruising or swelling noted at site.  Good blood return noted before and after administration of chemotherapy.  Chemo pump connected with no alarms noted.  Patient left in satisfactory condition with VSS and no s/s of distress noted.

## 2023-10-09 NOTE — Patient Instructions (Signed)

## 2023-10-09 NOTE — Progress Notes (Signed)
Verbal order received from Dr. Ellin Saba to give patient 0.5 mgs of Ativan IV push x 1 dose now.

## 2023-10-09 NOTE — Patient Instructions (Signed)
[facility]  Discharge Instructions: Thank you for choosing [facility] to provide your oncology and hematology care.  If you have a lab appointment with the Cancer Center - please note that after April 8th, 2024, all labs will be drawn in the cancer center.  You do not have to check in or register with the main entrance as you have in the past but will complete your check-in in the cancer center.  Wear comfortable clothing and clothing appropriate for easy access to any Portacath or PICC line.   We strive to give you quality time with your provider. You may need to reschedule your appointment if you arrive late (15 or more minutes).  Arriving late affects you and other patients whose appointments are after yours.  Also, if you miss three or more appointments without notifying the office, you may be dismissed from the clinic at the provider's discretion.      For prescription refill requests, have your pharmacy contact our office and allow 72 hours for refills to be completed.    Today you received the following chemotherapy and/or immunotherapy agents MVASI, irinotecan, leucovorin, adruicil.       To help prevent nausea and vomiting after your treatment, we encourage you to take your nausea medication as directed.  BELOW ARE SYMPTOMS THAT SHOULD BE REPORTED IMMEDIATELY: *FEVER GREATER THAN 100.4 F (38 C) OR HIGHER *CHILLS OR SWEATING *NAUSEA AND VOMITING THAT IS NOT CONTROLLED WITH YOUR NAUSEA MEDICATION *UNUSUAL SHORTNESS OF BREATH *UNUSUAL BRUISING OR BLEEDING *URINARY PROBLEMS (pain or burning when urinating, or frequent urination) *BOWEL PROBLEMS (unusual diarrhea, constipation, pain near the anus) TENDERNESS IN MOUTH AND THROAT WITH OR WITHOUT PRESENCE OF ULCERS (sore throat, sores in mouth, or a toothache) UNUSUAL RASH, SWELLING OR PAIN  UNUSUAL VAGINAL DISCHARGE OR ITCHING   Items with * indicate a potential emergency and should be followed up as  soon as possible or go to the Emergency Department if any problems should occur.  Please show the CHEMOTHERAPY ALERT CARD or IMMUNOTHERAPY ALERT CARD at check-in to the Emergency Department and triage nurse.  Should you have questions after your visit or need to cancel or reschedule your appointment, please contact [facility] [phone]  and follow the prompts.  Office hours are 8:00 a.m. to 4:30 p.m. Monday - Friday. Please note that voicemails left after 4:00 p.m. may not be returned until the following business day.  We are closed weekends and major holidays. You have access to a nurse at all times for urgent questions. Please call the main number to the clinic [phone] and follow the prompts.  For any non-urgent questions, you may also contact your provider using MyChart. We now offer e-Visits for anyone 4 and older to request care online for non-urgent symptoms. For details visit [email or web address].   Also download the MyChart app! Go to the app store, search "MyChart", open the app, select [email or web address], and log in with your MyChart username and password.

## 2023-10-11 ENCOUNTER — Inpatient Hospital Stay: Payer: Medicare Other

## 2023-10-11 ENCOUNTER — Other Ambulatory Visit: Payer: Self-pay

## 2023-10-11 VITALS — BP 117/80 | HR 90 | Temp 99.0°F | Resp 20

## 2023-10-11 DIAGNOSIS — C7802 Secondary malignant neoplasm of left lung: Secondary | ICD-10-CM | POA: Diagnosis not present

## 2023-10-11 DIAGNOSIS — C7972 Secondary malignant neoplasm of left adrenal gland: Secondary | ICD-10-CM | POA: Diagnosis not present

## 2023-10-11 DIAGNOSIS — Z5189 Encounter for other specified aftercare: Secondary | ICD-10-CM | POA: Diagnosis not present

## 2023-10-11 DIAGNOSIS — Z5111 Encounter for antineoplastic chemotherapy: Secondary | ICD-10-CM | POA: Diagnosis not present

## 2023-10-11 DIAGNOSIS — Z5112 Encounter for antineoplastic immunotherapy: Secondary | ICD-10-CM | POA: Diagnosis not present

## 2023-10-11 DIAGNOSIS — Z87891 Personal history of nicotine dependence: Secondary | ICD-10-CM | POA: Diagnosis not present

## 2023-10-11 DIAGNOSIS — Z79899 Other long term (current) drug therapy: Secondary | ICD-10-CM | POA: Diagnosis not present

## 2023-10-11 DIAGNOSIS — C180 Malignant neoplasm of cecum: Secondary | ICD-10-CM | POA: Diagnosis not present

## 2023-10-11 DIAGNOSIS — C7801 Secondary malignant neoplasm of right lung: Secondary | ICD-10-CM | POA: Diagnosis not present

## 2023-10-11 DIAGNOSIS — Z95828 Presence of other vascular implants and grafts: Secondary | ICD-10-CM

## 2023-10-11 DIAGNOSIS — C782 Secondary malignant neoplasm of pleura: Secondary | ICD-10-CM | POA: Diagnosis not present

## 2023-10-11 MED ORDER — HEPARIN SOD (PORK) LOCK FLUSH 100 UNIT/ML IV SOLN
500.0000 [IU] | Freq: Once | INTRAVENOUS | Status: AC | PRN
  Administered 2023-10-11: 500 [IU]

## 2023-10-11 MED ORDER — PEGFILGRASTIM-CBQV 6 MG/0.6ML ~~LOC~~ SOSY
6.0000 mg | PREFILLED_SYRINGE | Freq: Once | SUBCUTANEOUS | Status: AC
Start: 2023-10-11 — End: 2023-10-11
  Administered 2023-10-11: 6 mg via SUBCUTANEOUS
  Filled 2023-10-11: qty 0.6

## 2023-10-11 MED ORDER — SODIUM CHLORIDE 0.9% FLUSH
10.0000 mL | INTRAVENOUS | Status: DC | PRN
  Administered 2023-10-11: 10 mL

## 2023-10-11 NOTE — Patient Instructions (Signed)
[facility]  Discharge Instructions: Thank you for choosing [facility] to provide your oncology and hematology care.  If you have a lab appointment with the Cancer Center - please note that after April 8th, 2024, all labs will be drawn in the cancer center.  You do not have to check in or register with the main entrance as you have in the past but will complete your check-in in the cancer center.  Wear comfortable clothing and clothing appropriate for easy access to any Portacath or PICC line.   We strive to give you quality time with your provider. You may need to reschedule your appointment if you arrive late (15 or more minutes).  Arriving late affects you and other patients whose appointments are after yours.  Also, if you miss three or more appointments without notifying the office, you may be dismissed from the clinic at the provider's discretion.      For prescription refill requests, have your pharmacy contact our office and allow 72 hours for refills to be completed.    Today you received Udenyca injection and 5FU pump disconnection.      To help prevent nausea and vomiting after your treatment, we encourage you to take your nausea medication as directed.  BELOW ARE SYMPTOMS THAT SHOULD BE REPORTED IMMEDIATELY: *FEVER GREATER THAN 100.4 F (38 C) OR HIGHER *CHILLS OR SWEATING *NAUSEA AND VOMITING THAT IS NOT CONTROLLED WITH YOUR NAUSEA MEDICATION *UNUSUAL SHORTNESS OF BREATH *UNUSUAL BRUISING OR BLEEDING *URINARY PROBLEMS (pain or burning when urinating, or frequent urination) *BOWEL PROBLEMS (unusual diarrhea, constipation, pain near the anus) TENDERNESS IN MOUTH AND THROAT WITH OR WITHOUT PRESENCE OF ULCERS (sore throat, sores in mouth, or a toothache) UNUSUAL RASH, SWELLING OR PAIN  UNUSUAL VAGINAL DISCHARGE OR ITCHING   Items with * indicate a potential emergency and should be followed up as soon as possible or go to the Emergency Department  if any problems should occur.  Please show the CHEMOTHERAPY ALERT CARD or IMMUNOTHERAPY ALERT CARD at check-in to the Emergency Department and triage nurse.  Should you have questions after your visit or need to cancel or reschedule your appointment, please contact [facility] AT [facility] [phone]  and follow the prompts.  Office hours are 8:00 a.m. to 4:30 p.m. Monday - Friday. Please note that voicemails left after 4:00 p.m. may not be returned until the following business day.  We are closed weekends and major holidays. You have access to a nurse at all times for urgent questions. Please call the main number to the clinic [phone] and follow the prompts.  For any non-urgent questions, you may also contact your provider using MyChart. We now offer e-Visits for anyone 73 and older to request care online for non-urgent symptoms. For details visit [email or web address].   Also download the MyChart app! Go to the app store, search "MyChart", open the app, select [place], and log in with your MyChart username and password.

## 2023-10-11 NOTE — Progress Notes (Signed)
Patient presents today for 5FU ambulatory pump disconnection and Udenyca 6 mg injection per provider's order. Vital signs stable and pt voiced no new complaints at this time. Port flushed easily without difficulty with 10 mL of normal saline and 5 mL of heparin. Good blood return noted and needle removed intact. No bruising or swelling noted at the site.   Discharged from clinic ambulatory in stable condition. Alert and oriented x 3. F/U with [facility] as scheduled.

## 2023-10-12 ENCOUNTER — Encounter: Payer: Self-pay | Admitting: Hematology

## 2023-10-12 NOTE — Addendum Note (Signed)
Addended by: Neita Goodnight on: 10/12/2023 12:59 PM   Modules accepted: Orders

## 2023-10-12 NOTE — Addendum Note (Signed)
Addended by: Neita Goodnight on: 10/12/2023 01:04 PM   Modules accepted: Orders

## 2023-10-12 NOTE — Progress Notes (Signed)
Emend added to treatment plan for future treatments. Notified PA team. Atropine 1mg  IV PRN added as well per MD.  Richardean Sale, RPH, BCPS, BCOP 10/12/2023 1:03 PM

## 2023-10-16 ENCOUNTER — Telehealth: Payer: Self-pay

## 2023-10-16 DIAGNOSIS — K1379 Other lesions of oral mucosa: Secondary | ICD-10-CM

## 2023-10-16 DIAGNOSIS — C180 Malignant neoplasm of cecum: Secondary | ICD-10-CM

## 2023-10-16 MED ORDER — MISC. DEVICES MISC: 3 refills | Status: DC

## 2023-10-16 NOTE — Telephone Encounter (Signed)
Patient left message asking for refill on Magic mouth wash due to sores and blisters.   Spoke with the patient and reviewed directions.  Patient feels he has been taking his Megace by accident in place of the magic mouth wash due to no relief.  Prescription sent to pharmacy per standing orders Dr. Ellin Saba.

## 2023-10-22 ENCOUNTER — Encounter: Payer: Self-pay | Admitting: Hematology

## 2023-10-23 ENCOUNTER — Inpatient Hospital Stay: Payer: Medicare Other | Admitting: Hematology

## 2023-10-23 ENCOUNTER — Inpatient Hospital Stay: Payer: Medicare Other

## 2023-10-23 ENCOUNTER — Inpatient Hospital Stay: Payer: Medicare Other | Attending: Hematology

## 2023-10-23 ENCOUNTER — Other Ambulatory Visit: Payer: Self-pay

## 2023-10-23 VITALS — BP 115/78 | HR 73 | Temp 97.1°F | Resp 18 | Wt 175.6 lb

## 2023-10-23 DIAGNOSIS — R197 Diarrhea, unspecified: Secondary | ICD-10-CM

## 2023-10-23 DIAGNOSIS — C180 Malignant neoplasm of cecum: Secondary | ICD-10-CM | POA: Diagnosis not present

## 2023-10-23 DIAGNOSIS — C7972 Secondary malignant neoplasm of left adrenal gland: Secondary | ICD-10-CM | POA: Insufficient documentation

## 2023-10-23 DIAGNOSIS — Z95828 Presence of other vascular implants and grafts: Secondary | ICD-10-CM

## 2023-10-23 DIAGNOSIS — Z5111 Encounter for antineoplastic chemotherapy: Secondary | ICD-10-CM | POA: Insufficient documentation

## 2023-10-23 DIAGNOSIS — C7801 Secondary malignant neoplasm of right lung: Secondary | ICD-10-CM | POA: Insufficient documentation

## 2023-10-23 DIAGNOSIS — Z5189 Encounter for other specified aftercare: Secondary | ICD-10-CM | POA: Insufficient documentation

## 2023-10-23 DIAGNOSIS — Z5112 Encounter for antineoplastic immunotherapy: Secondary | ICD-10-CM | POA: Diagnosis not present

## 2023-10-23 DIAGNOSIS — C7802 Secondary malignant neoplasm of left lung: Secondary | ICD-10-CM | POA: Diagnosis not present

## 2023-10-23 LAB — CBC WITH DIFFERENTIAL/PLATELET
Abs Immature Granulocytes: 1.2 10*3/uL — ABNORMAL HIGH (ref 0.00–0.07)
Band Neutrophils: 11 %
Basophils Absolute: 0.3 10*3/uL — ABNORMAL HIGH (ref 0.0–0.1)
Basophils Relative: 1 %
Blasts: 2 %
Eosinophils Absolute: 0 10*3/uL (ref 0.0–0.5)
Eosinophils Relative: 0 %
HCT: 31.4 % — ABNORMAL LOW (ref 39.0–52.0)
Hemoglobin: 10.2 g/dL — ABNORMAL LOW (ref 13.0–17.0)
Lymphocytes Relative: 14 %
Lymphs Abs: 4.1 10*3/uL — ABNORMAL HIGH (ref 0.7–4.0)
MCH: 32 pg (ref 26.0–34.0)
MCHC: 32.5 g/dL (ref 30.0–36.0)
MCV: 98.4 fL (ref 80.0–100.0)
Metamyelocytes Relative: 1 %
Monocytes Absolute: 2.9 10*3/uL — ABNORMAL HIGH (ref 0.1–1.0)
Monocytes Relative: 10 %
Myelocytes: 3 %
Neutro Abs: 20 10*3/uL — ABNORMAL HIGH (ref 1.7–7.7)
Neutrophils Relative %: 58 %
Platelets: 289 10*3/uL (ref 150–400)
RBC: 3.19 MIL/uL — ABNORMAL LOW (ref 4.22–5.81)
RDW: 15.9 % — ABNORMAL HIGH (ref 11.5–15.5)
WBC: 29 10*3/uL — ABNORMAL HIGH (ref 4.0–10.5)
nRBC: 0.5 % — ABNORMAL HIGH (ref 0.0–0.2)

## 2023-10-23 LAB — COMPREHENSIVE METABOLIC PANEL
ALT: 13 U/L (ref 0–44)
AST: 24 U/L (ref 15–41)
Albumin: 3.8 g/dL (ref 3.5–5.0)
Alkaline Phosphatase: 70 U/L (ref 38–126)
Anion gap: 9 (ref 5–15)
BUN: 18 mg/dL (ref 8–23)
CO2: 20 mmol/L — ABNORMAL LOW (ref 22–32)
Calcium: 8.7 mg/dL — ABNORMAL LOW (ref 8.9–10.3)
Chloride: 105 mmol/L (ref 98–111)
Creatinine, Ser: 1.53 mg/dL — ABNORMAL HIGH (ref 0.61–1.24)
GFR, Estimated: 50 mL/min — ABNORMAL LOW (ref >60)
Glucose, Bld: 107 mg/dL — ABNORMAL HIGH (ref 70–99)
Potassium: 3.4 mmol/L — ABNORMAL LOW (ref 3.5–5.1)
Sodium: 134 mmol/L — ABNORMAL LOW (ref 135–145)
Total Bilirubin: 0.4 mg/dL (ref <1.2)
Total Protein: 7.5 g/dL (ref 6.5–8.1)

## 2023-10-23 LAB — MAGNESIUM: Magnesium: 1.9 mg/dL (ref 1.7–2.4)

## 2023-10-23 MED ORDER — HEPARIN SOD (PORK) LOCK FLUSH 100 UNIT/ML IV SOLN
500.0000 [IU] | Freq: Once | INTRAVENOUS | Status: AC
  Administered 2023-10-23: 500 [IU] via INTRAVENOUS

## 2023-10-23 MED ORDER — SODIUM CHLORIDE 0.9% FLUSH
10.0000 mL | INTRAVENOUS | Status: DC | PRN
  Administered 2023-10-23: 10 mL via INTRAVENOUS

## 2023-10-23 MED ORDER — LOPERAMIDE HCL 2 MG PO CAPS: 2.0000 mg | ORAL_CAPSULE | ORAL | 0 refills | Status: DC | PRN

## 2023-10-23 MED ORDER — SODIUM CHLORIDE 0.9% FLUSH
10.0000 mL | Freq: Once | INTRAVENOUS | Status: AC
  Administered 2023-10-23: 10 mL via INTRAVENOUS

## 2023-10-23 NOTE — Progress Notes (Signed)
Patient presents today for possible treatment, patient states he has had painful diarrhea since last treatment, feels weak and has had decreased appetite. Per Dr. Ellin Saba if patient feels very weak hold treatment and reschedule for next week with reduce dose of irinotecan, pharmacy aware. Patient made aware and new appointment made. Imodium 2mg  sent into patient's pharmacy per standing orders. Port flushed with good blood return noted. No bruising or swelling at site. Bandaid applied and patient discharged in satisfactory condition. VVS stable with no signs or symptoms of distressed noted.

## 2023-10-23 NOTE — Progress Notes (Signed)
Patients port flushed without difficulty.  Good blood return noted with no bruising or swelling noted at site.  Patient remains accessed for treatment.  

## 2023-10-23 NOTE — Patient Instructions (Signed)
[facility] A DEPT OF [facility]  Discharge Instructions: Thank you for choosing [facility] to provide your oncology and hematology care.  If you have a lab appointment with the Cancer Center - please note that after April 8th, 2024, all labs will be drawn in the cancer center.  You do not have to check in or register with the main entrance as you have in the past but will complete your check-in in the cancer center.  Wear comfortable clothing and clothing appropriate for easy access to any Portacath or PICC line.   We strive to give you quality time with your provider. You may need to reschedule your appointment if you arrive late (15 or more minutes).  Arriving late affects you and other patients whose appointments are after yours.  Also, if you miss three or more appointments without notifying the office, you may be dismissed from the clinic at the provider's discretion.      For prescription refill requests, have your pharmacy contact our office and allow 72 hours for refills to be completed.    Today you received the following treatment held, imodium sent into pharmacy, port flushed, return as scheduled.   To help prevent nausea and vomiting after your treatment, we encourage you to take your nausea medication as directed.  BELOW ARE SYMPTOMS THAT SHOULD BE REPORTED IMMEDIATELY: *FEVER GREATER THAN 100.4 F (38 C) OR HIGHER *CHILLS OR SWEATING *NAUSEA AND VOMITING THAT IS NOT CONTROLLED WITH YOUR NAUSEA MEDICATION *UNUSUAL SHORTNESS OF BREATH *UNUSUAL BRUISING OR BLEEDING *URINARY PROBLEMS (pain or burning when urinating, or frequent urination) *BOWEL PROBLEMS (unusual diarrhea, constipation, pain near the anus) TENDERNESS IN MOUTH AND THROAT WITH OR WITHOUT PRESENCE OF ULCERS (sore throat, sores in mouth, or a toothache) UNUSUAL RASH, SWELLING OR PAIN  UNUSUAL VAGINAL DISCHARGE OR ITCHING   Items with * indicate a potential emergency  and should be followed up as soon as possible or go to the Emergency Department if any problems should occur.  Please show the CHEMOTHERAPY ALERT CARD or IMMUNOTHERAPY ALERT CARD at check-in to the Emergency Department and triage nurse.  Should you have questions after your visit or need to cancel or reschedule your appointment, please contact [facility] [facility] - A DEPT OF [facility] [phone]  and follow the prompts.  Office hours are 8:00 a.m. to 4:30 p.m. Monday - Friday. Please note that voicemails left after 4:00 p.m. may not be returned until the following business day.  We are closed weekends and major holidays. You have access to a nurse at all times for urgent questions. Please call the main number to the clinic [phone] and follow the prompts.  For any non-urgent questions, you may also contact your provider using MyChart. We now offer e-Visits for anyone 74 and older to request care online for non-urgent symptoms. For details visit [email or web address].   Also download the MyChart app! Go to the app store, search "MyChart", open the app, select , and log in with your MyChart username and password.

## 2023-10-25 ENCOUNTER — Inpatient Hospital Stay: Payer: Medicare Other

## 2023-10-29 ENCOUNTER — Other Ambulatory Visit: Payer: Self-pay

## 2023-10-29 ENCOUNTER — Inpatient Hospital Stay: Payer: Medicare Other

## 2023-10-29 VITALS — BP 95/62 | HR 53 | Resp 17 | Ht 71.0 in

## 2023-10-29 DIAGNOSIS — Z5112 Encounter for antineoplastic immunotherapy: Secondary | ICD-10-CM | POA: Diagnosis not present

## 2023-10-29 DIAGNOSIS — C7801 Secondary malignant neoplasm of right lung: Secondary | ICD-10-CM | POA: Diagnosis not present

## 2023-10-29 DIAGNOSIS — C180 Malignant neoplasm of cecum: Secondary | ICD-10-CM

## 2023-10-29 DIAGNOSIS — Z95828 Presence of other vascular implants and grafts: Secondary | ICD-10-CM

## 2023-10-29 DIAGNOSIS — C7972 Secondary malignant neoplasm of left adrenal gland: Secondary | ICD-10-CM | POA: Diagnosis not present

## 2023-10-29 DIAGNOSIS — Z5189 Encounter for other specified aftercare: Secondary | ICD-10-CM | POA: Diagnosis not present

## 2023-10-29 DIAGNOSIS — C7802 Secondary malignant neoplasm of left lung: Secondary | ICD-10-CM | POA: Diagnosis not present

## 2023-10-29 DIAGNOSIS — Z5111 Encounter for antineoplastic chemotherapy: Secondary | ICD-10-CM | POA: Diagnosis not present

## 2023-10-29 LAB — CBC WITH DIFFERENTIAL/PLATELET
Abs Immature Granulocytes: 2.61 10*3/uL — ABNORMAL HIGH (ref 0.00–0.07)
Basophils Absolute: 0.2 10*3/uL — ABNORMAL HIGH (ref 0.0–0.1)
Basophils Relative: 1 %
Eosinophils Absolute: 0.7 10*3/uL — ABNORMAL HIGH (ref 0.0–0.5)
Eosinophils Relative: 4 %
HCT: 31.3 % — ABNORMAL LOW (ref 39.0–52.0)
Hemoglobin: 10 g/dL — ABNORMAL LOW (ref 13.0–17.0)
Immature Granulocytes: 14 %
Lymphocytes Relative: 17 %
Lymphs Abs: 3.3 10*3/uL (ref 0.7–4.0)
MCH: 32.2 pg (ref 26.0–34.0)
MCHC: 31.9 g/dL (ref 30.0–36.0)
MCV: 100.6 fL — ABNORMAL HIGH (ref 80.0–100.0)
Monocytes Absolute: 2.1 10*3/uL — ABNORMAL HIGH (ref 0.1–1.0)
Monocytes Relative: 11 %
Neutro Abs: 10.2 10*3/uL — ABNORMAL HIGH (ref 1.7–7.7)
Neutrophils Relative %: 53 %
Platelets: 200 10*3/uL (ref 150–400)
RBC: 3.11 MIL/uL — ABNORMAL LOW (ref 4.22–5.81)
RDW: 15.9 % — ABNORMAL HIGH (ref 11.5–15.5)
WBC: 19.1 10*3/uL — ABNORMAL HIGH (ref 4.0–10.5)
nRBC: 0.9 % — ABNORMAL HIGH (ref 0.0–0.2)

## 2023-10-29 LAB — COMPREHENSIVE METABOLIC PANEL
ALT: 8 U/L (ref 0–44)
AST: 17 U/L (ref 15–41)
Albumin: 3.1 g/dL — ABNORMAL LOW (ref 3.5–5.0)
Alkaline Phosphatase: 66 U/L (ref 38–126)
Anion gap: 6 (ref 5–15)
BUN: 9 mg/dL (ref 8–23)
CO2: 25 mmol/L (ref 22–32)
Calcium: 8.6 mg/dL — ABNORMAL LOW (ref 8.9–10.3)
Chloride: 105 mmol/L (ref 98–111)
Creatinine, Ser: 0.96 mg/dL (ref 0.61–1.24)
GFR, Estimated: >60 mL/min (ref >60)
Glucose, Bld: 96 mg/dL (ref 70–99)
Potassium: 3.9 mmol/L (ref 3.5–5.1)
Sodium: 136 mmol/L (ref 135–145)
Total Bilirubin: 0.6 mg/dL (ref <1.2)
Total Protein: 6.5 g/dL (ref 6.5–8.1)

## 2023-10-29 LAB — MAGNESIUM: Magnesium: 1.7 mg/dL (ref 1.7–2.4)

## 2023-10-29 MED ORDER — DEXAMETHASONE SODIUM PHOSPHATE 10 MG/ML IJ SOLN
10.0000 mg | Freq: Once | INTRAMUSCULAR | Status: AC
  Administered 2023-10-29: 10 mg via INTRAVENOUS
  Filled 2023-10-29: qty 1

## 2023-10-29 MED ORDER — SODIUM CHLORIDE 0.9 % IV SOLN
150.0000 mg | Freq: Once | INTRAVENOUS | Status: AC
  Administered 2023-10-29: 150 mg via INTRAVENOUS
  Filled 2023-10-29: qty 5

## 2023-10-29 MED ORDER — FLUOROURACIL CHEMO INJECTION 5 GM/100ML
1920.0000 mg/m2 | INTRAVENOUS | Status: DC
  Administered 2023-10-29: 3500 mg via INTRAVENOUS
  Filled 2023-10-29: qty 70

## 2023-10-29 MED ORDER — SODIUM CHLORIDE 0.9 % IV SOLN
5.0000 mg/kg | Freq: Once | INTRAVENOUS | Status: AC
  Administered 2023-10-29: 400 mg via INTRAVENOUS
  Filled 2023-10-29: qty 16

## 2023-10-29 MED ORDER — SODIUM CHLORIDE 0.9 % IV SOLN
320.0000 mg/m2 | Freq: Once | INTRAVENOUS | Status: AC
  Administered 2023-10-29: 624 mg via INTRAVENOUS
  Filled 2023-10-29: qty 31.2

## 2023-10-29 MED ORDER — ALUMINUM-MAGNESIUM-SIMETHICONE 200-200-20 MG/5ML PO SUSP: 30.0000 mL | Freq: Three times a day (TID) | ORAL | 2 refills | Status: DC

## 2023-10-29 MED ORDER — ATROPINE SULFATE 1 MG/ML IV SOLN
1.0000 mg | Freq: Once | INTRAVENOUS | Status: AC
Start: 2023-10-29 — End: 2023-10-29
  Administered 2023-10-29: 1 mg via INTRAVENOUS
  Filled 2023-10-29: qty 1

## 2023-10-29 MED ORDER — SODIUM CHLORIDE 0.9 % IV SOLN
144.0000 mg/m2 | Freq: Once | INTRAVENOUS | Status: AC
  Administered 2023-10-29: 300 mg via INTRAVENOUS
  Filled 2023-10-29: qty 15

## 2023-10-29 MED ORDER — FLUOROURACIL CHEMO INJECTION 2.5 GM/50ML
320.0000 mg/m2 | Freq: Once | INTRAVENOUS | Status: AC
  Administered 2023-10-29: 600 mg via INTRAVENOUS
  Filled 2023-10-29: qty 12

## 2023-10-29 MED ORDER — LIDOCAINE-PRILOCAINE 2.5-2.5 % EX CREA: 1.0000 | TOPICAL_CREAM | CUTANEOUS | 0 refills | Status: DC | PRN

## 2023-10-29 MED ORDER — SODIUM CHLORIDE 0.9 % IV SOLN: Freq: Once | INTRAVENOUS | Status: AC

## 2023-10-29 MED ORDER — PALONOSETRON HCL INJECTION 0.25 MG/5ML
0.2500 mg | Freq: Once | INTRAVENOUS | Status: AC
Start: 2023-10-29 — End: 2023-10-29
  Administered 2023-10-29: 0.25 mg via INTRAVENOUS
  Filled 2023-10-29: qty 5

## 2023-10-29 NOTE — Progress Notes (Signed)
Patient presents today for chemotherapy infusion.  Patient is in satisfactory condition with no new complaints voiced.  Vital signs are stable.  Labs reviewed and all labs are within treatment parameters.  We will proceed with treatment per MD orders.    Patient tolerated treatment well with no complaints voiced.  Home infusion 5FU pump connected with no issues.  Patient left ambulatory in stable condition.  Vital signs stable at discharge.  Follow up as scheduled.

## 2023-10-29 NOTE — Patient Instructions (Signed)
[facility] - A DEPT OF [facility]  Discharge Instructions: Thank you for choosing [facility] to provide your oncology and hematology care.  If you have a lab appointment with the Cancer Center - please note that after April 8th, 2024, all labs will be drawn in the cancer center.  You do not have to check in or register with the main entrance as you have in the past but will complete your check-in in the cancer center.  Wear comfortable clothing and clothing appropriate for easy access to any Portacath or PICC line.   We strive to give you quality time with your provider. You may need to reschedule your appointment if you arrive late (15 or more minutes).  Arriving late affects you and other patients whose appointments are after yours.  Also, if you miss three or more appointments without notifying the office, you may be dismissed from the clinic at the provider's discretion.      For prescription refill requests, have your pharmacy contact our office and allow 72 hours for refills to be completed.    Today you received the following chemotherapy and/or immunotherapy agents MVASI/Irinotecan/Leucovorin/Fluorouracil.  Bevacizumab Injection What is this medication? BEVACIZUMAB (be va SIZ yoo mab) treats some types of cancer. It works by blocking a protein that causes cancer cells to grow and multiply. This helps to slow or stop the spread of cancer cells. It is a monoclonal antibody. This medicine may be used for other purposes; ask your health care provider or pharmacist if you have questions. COMMON BRAND NAME(S): Alymsys, Avastin, MVASI, Omer Jack What should I tell my care team before I take this medication? They need to know if you have any of these conditions: Blood clots Coughing up blood Having or recent surgery Heart failure High blood pressure History of a connection between 2 or more body parts that do not usually connect  (fistula) History of a tear in your stomach or intestines Protein in your urine An unusual or allergic reaction to bevacizumab, other medications, foods, dyes, or preservatives Pregnant or trying to get pregnant Breast-feeding How should I use this medication? This medication is injected into a vein. It is given by your care team in a hospital or clinic setting. Talk to your care team the use of this medication in children. Special care may be needed. Overdosage: If you think you have taken too much of this medicine contact a poison control center or emergency room at once. NOTE: This medicine is only for you. Do not share this medicine with others. What if I miss a dose? Keep appointments for follow-up doses. It is important not to miss your dose. Call your care team if you are unable to keep an appointment. What may interact with this medication? Interactions are not expected. This list may not describe all possible interactions. Give your health care provider a list of all the medicines, herbs, non-prescription drugs, or dietary supplements you use. Also tell them if you smoke, drink alcohol, or use illegal drugs. Some items may interact with your medicine. What should I watch for while using this medication? Your condition will be monitored carefully while you are receiving this medication. You may need blood work while taking this medication. This medication may make you feel generally unwell. This is not uncommon as chemotherapy can affect healthy cells as well as cancer cells. Report any side effects. Continue your course of treatment even though you feel ill unless  your care team tells you to stop. This medication may increase your risk to bruise or bleed. Call your care team if you notice any unusual bleeding. Before having surgery, talk to your care team to make sure it is ok. This medication can increase the risk of poor healing of your surgical site or wound. You will need to stop  this medication for 28 days before surgery. After surgery, wait at least 28 days before restarting this medication. Make sure the surgical site or wound is healed enough before restarting this medication. Talk to your care team if questions. Talk to your care team if you may be pregnant. Serious birth defects can occur if you take this medication during pregnancy and for 6 months after the last dose. Contraception is recommended while taking this medication and for 6 months after the last dose. Your care team can help you find the option that works for you. Do not breastfeed while taking this medication and for 6 months after the last dose. This medication can cause infertility. Talk to your care team if you are concerned about your fertility. What side effects may I notice from receiving this medication? Side effects that you should report to your care team as soon as possible: Allergic reactions--skin rash, itching, hives, swelling of the face, lips, tongue, or throat Bleeding--bloody or black, tar-like stools, vomiting blood or Kajsa Butrum material that looks like coffee grounds, red or dark Mazelle Huebert urine, small red or purple spots on skin, unusual bruising or bleeding Blood clot--pain, swelling, or warmth in the leg, shortness of breath, chest pain Heart attack--pain or tightness in the chest, shoulders, arms, or jaw, nausea, shortness of breath, cold or clammy skin, feeling faint or lightheaded Heart failure--shortness of breath, swelling of the ankles, feet, or hands, sudden weight gain, unusual weakness or fatigue Increase in blood pressure Infection--fever, chills, cough, sore throat, wounds that don't heal, pain or trouble when passing urine, general feeling of discomfort or being unwell Infusion reactions--chest pain, shortness of breath or trouble breathing, feeling faint or lightheaded Kidney injury--decrease in the amount of urine, swelling of the ankles, hands, or feet Stomach pain that is  severe, does not go away, or gets worse Stroke--sudden numbness or weakness of the face, arm, or leg, trouble speaking, confusion, trouble walking, loss of balance or coordination, dizziness, severe headache, change in vision Sudden and severe headache, confusion, change in vision, seizures, which may be signs of posterior reversible encephalopathy syndrome (PRES) Side effects that usually do not require medical attention (report to your care team if they continue or are bothersome): Back pain Change in taste Diarrhea Dry skin Increased tears Nosebleed This list may not describe all possible side effects. Call your doctor for medical advice about side effects. You may report side effects to FDA at 1-800-FDA-1088. Where should I keep my medication? This medication is given in a hospital or clinic. It will not be stored at home. NOTE: This sheet is a summary. It may not cover all possible information. If you have questions about this medicine, talk to your doctor, pharmacist, or health care provider.  2024 Elsevier/Gold Standard (2022-04-14 00:00:00)    Irinotecan Injection What is this medication? IRINOTECAN (ir in oh TEE kan) treats some types of cancer. It works by slowing down the growth of cancer cells. This medicine may be used for other purposes; ask your health care provider or pharmacist if you have questions. COMMON BRAND NAME(S): Camptosar What should I tell my care team before  I take this medication? They need to know if you have any of these conditions: Dehydration Diarrhea Infection, especially a viral infection, such as chickenpox, cold sores, herpes Liver disease Low blood cell levels (white cells, red cells, and platelets) Low levels of electrolytes, such as calcium, magnesium, or potassium in your blood Recent or ongoing radiation An unusual or allergic reaction to irinotecan, other medications, foods, dyes, or preservatives If you or your partner are pregnant or  trying to get pregnant Breast-feeding How should I use this medication? This medication is injected into a vein. It is given by your care team in a hospital or clinic setting. Talk to your care team about the use of this medication in children. Special care may be needed. Overdosage: If you think you have taken too much of this medicine contact a poison control center or emergency room at once. NOTE: This medicine is only for you. Do not share this medicine with others. What if I miss a dose? Keep appointments for follow-up doses. It is important not to miss your dose. Call your care team if you are unable to keep an appointment. What may interact with this medication? Do not take this medication with any of the following: Cobicistat Itraconazole This medication may also interact with the following: Certain antibiotics, such as clarithromycin, rifampin, rifabutin Certain antivirals for HIV or AIDS Certain medications for fungal infections, such as ketoconazole, posaconazole, voriconazole Certain medications for seizures, such as carbamazepine, phenobarbital, phenytoin Gemfibrozil Nefazodone St. John's wort This list may not describe all possible interactions. Give your health care provider a list of all the medicines, herbs, non-prescription drugs, or dietary supplements you use. Also tell them if you smoke, drink alcohol, or use illegal drugs. Some items may interact with your medicine. What should I watch for while using this medication? Your condition will be monitored carefully while you are receiving this medication. You may need blood work while taking this medication. This medication may make you feel generally unwell. This is not uncommon as chemotherapy can affect healthy cells as well as cancer cells. Report any side effects. Continue your course of treatment even though you feel ill unless your care team tells you to stop. This medication can cause serious side effects. To reduce  the risk, your care team may give you other medications to take before receiving this one. Be sure to follow the directions from your care team. This medication may affect your coordination, reaction time, or judgement. Do not drive or operate machinery until you know how this medication affects you. Sit up or stand slowly to reduce the risk of dizzy or fainting spells. Drinking alcohol with this medication can increase the risk of these side effects. This medication may increase your risk of getting an infection. Call your care team for advice if you get a fever, chills, sore throat, or other symptoms of a cold or flu. Do not treat yourself. Try to avoid being around people who are sick. Avoid taking medications that contain aspirin, acetaminophen, ibuprofen, naproxen, or ketoprofen unless instructed by your care team. These medications may hide a fever. This medication may increase your risk to bruise or bleed. Call your care team if you notice any unusual bleeding. Be careful brushing or flossing your teeth or using a toothpick because you may get an infection or bleed more easily. If you have any dental work done, tell your dentist you are receiving this medication. Talk to your care team if you or your partner  are pregnant or think either of you might be pregnant. This medication can cause serious birth defects if taken during pregnancy and for 6 months after the last dose. You will need a negative pregnancy test before starting this medication. Contraception is recommended while taking this medication and for 6 months after the last dose. Your care team can help you find the option that works for you. Do not father a child while taking this medication and for 3 months after the last dose. Use a condom for contraception during this time period. Do not breastfeed while taking this medication and for 7 days after the last dose. This medication may cause infertility. Talk to your care team if you are  concerned about your fertility. What side effects may I notice from receiving this medication? Side effects that you should report to your care team as soon as possible: Allergic reactions--skin rash, itching, hives, swelling of the face, lips, tongue, or throat Dry cough, shortness of breath or trouble breathing Increased saliva or tears, increased sweating, stomach cramping, diarrhea, small pupils, unusual weakness or fatigue, slow heartbeat Infection--fever, chills, cough, sore throat, wounds that don't heal, pain or trouble when passing urine, general feeling of discomfort or being unwell Kidney injury--decrease in the amount of urine, swelling of the ankles, hands, or feet Low red blood cell level--unusual weakness or fatigue, dizziness, headache, trouble breathing Severe or prolonged diarrhea Unusual bruising or bleeding Side effects that usually do not require medical attention (report to your care team if they continue or are bothersome): Constipation Diarrhea Hair loss Loss of appetite Nausea Stomach pain This list may not describe all possible side effects. Call your doctor for medical advice about side effects. You may report side effects to FDA at 1-800-FDA-1088. Where should I keep my medication? This medication is given in a hospital or clinic. It will not be stored at home. NOTE: This sheet is a summary. It may not cover all possible information. If you have questions about this medicine, talk to your doctor, pharmacist, or health care provider.  2024 Elsevier/Gold Standard (2022-04-10 00:00:00)    Leucovorin Injection What is this medication? LEUCOVORIN (loo koe VOR in) prevents side effects from certain medications, such as methotrexate. It works by increasing folate levels. This helps protect healthy cells in your body. It may also be used to treat anemia caused by low levels of folate. It can also be used with fluorouracil, a type of chemotherapy, to treat colorectal  cancer. It works by increasing the effects of fluorouracil in the body. This medicine may be used for other purposes; ask your health care provider or pharmacist if you have questions. What should I tell my care team before I take this medication? They need to know if you have any of these conditions: Anemia from low levels of vitamin B12 in the blood An unusual or allergic reaction to leucovorin, folic acid, other medications, foods, dyes, or preservatives Pregnant or trying to get pregnant Breastfeeding How should I use this medication? This medication is injected into a vein or a muscle. It is given by your care team in a hospital or clinic setting. Talk to your care team about the use of this medication in children. Special care may be needed. Overdosage: If you think you have taken too much of this medicine contact a poison control center or emergency room at once. NOTE: This medicine is only for you. Do not share this medicine with others. What if I miss a dose?  Keep appointments for follow-up doses. It is important not to miss your dose. Call your care team if you are unable to keep an appointment. What may interact with this medication? Capecitabine Fluorouracil Phenobarbital Phenytoin Primidone Trimethoprim;sulfamethoxazole This list may not describe all possible interactions. Give your health care provider a list of all the medicines, herbs, non-prescription drugs, or dietary supplements you use. Also tell them if you smoke, drink alcohol, or use illegal drugs. Some items may interact with your medicine. What should I watch for while using this medication? Your condition will be monitored carefully while you are receiving this medication. This medication may increase the side effects of 5-fluorouracil. Tell your care team if you have diarrhea or mouth sores that do not get better or that get worse. What side effects may I notice from receiving this medication? Side effects that  you should report to your care team as soon as possible: Allergic reactions--skin rash, itching, hives, swelling of the face, lips, tongue, or throat This list may not describe all possible side effects. Call your doctor for medical advice about side effects. You may report side effects to FDA at 1-800-FDA-1088. Where should I keep my medication? This medication is given in a hospital or clinic. It will not be stored at home. NOTE: This sheet is a summary. It may not cover all possible information. If you have questions about this medicine, talk to your doctor, pharmacist, or health care provider.  2024 Elsevier/Gold Standard (2022-05-02 00:00:00)    Fluorouracil Injection What is this medication? FLUOROURACIL (flure oh YOOR a sil) treats some types of cancer. It works by slowing down the growth of cancer cells. This medicine may be used for other purposes; ask your health care provider or pharmacist if you have questions. COMMON BRAND NAME(S): Adrucil What should I tell my care team before I take this medication? They need to know if you have any of these conditions: Blood disorders Dihydropyrimidine dehydrogenase (DPD) deficiency Infection, such as chickenpox, cold sores, herpes Kidney disease Liver disease Poor nutrition Recent or ongoing radiation therapy An unusual or allergic reaction to fluorouracil, other medications, foods, dyes, or preservatives If you or your partner are pregnant or trying to get pregnant Breast-feeding How should I use this medication? This medication is injected into a vein. It is administered by your care team in a hospital or clinic setting. Talk to your care team about the use of this medication in children. Special care may be needed. Overdosage: If you think you have taken too much of this medicine contact a poison control center or emergency room at once. NOTE: This medicine is only for you. Do not share this medicine with others. What if I miss a  dose? Keep appointments for follow-up doses. It is important not to miss your dose. Call your care team if you are unable to keep an appointment. What may interact with this medication? Do not take this medication with any of the following: Live virus vaccines This medication may also interact with the following: Medications that treat or prevent blood clots, such as warfarin, enoxaparin, dalteparin This list may not describe all possible interactions. Give your health care provider a list of all the medicines, herbs, non-prescription drugs, or dietary supplements you use. Also tell them if you smoke, drink alcohol, or use illegal drugs. Some items may interact with your medicine. What should I watch for while using this medication? Your condition will be monitored carefully while you are receiving this medication. This  medication may make you feel generally unwell. This is not uncommon as chemotherapy can affect healthy cells as well as cancer cells. Report any side effects. Continue your course of treatment even though you feel ill unless your care team tells you to stop. In some cases, you may be given additional medications to help with side effects. Follow all directions for their use. This medication may increase your risk of getting an infection. Call your care team for advice if you get a fever, chills, sore throat, or other symptoms of a cold or flu. Do not treat yourself. Try to avoid being around people who are sick. This medication may increase your risk to bruise or bleed. Call your care team if you notice any unusual bleeding. Be careful brushing or flossing your teeth or using a toothpick because you may get an infection or bleed more easily. If you have any dental work done, tell your dentist you are receiving this medication. Avoid taking medications that contain aspirin, acetaminophen, ibuprofen, naproxen, or ketoprofen unless instructed by your care team. These medications may hide  a fever. Do not treat diarrhea with over the counter products. Contact your care team if you have diarrhea that lasts more than 2 days or if it is severe and watery. This medication can make you more sensitive to the sun. Keep out of the sun. If you cannot avoid being in the sun, wear protective clothing and sunscreen. Do not use sun lamps, tanning beds, or tanning booths. Talk to your care team if you or your partner wish to become pregnant or think you might be pregnant. This medication can cause serious birth defects if taken during pregnancy and for 3 months after the last dose. A reliable form of contraception is recommended while taking this medication and for 3 months after the last dose. Talk to your care team about effective forms of contraception. Do not father a child while taking this medication and for 3 months after the last dose. Use a condom while having sex during this time period. Do not breastfeed while taking this medication. This medication may cause infertility. Talk to your care team if you are concerned about your fertility. What side effects may I notice from receiving this medication? Side effects that you should report to your care team as soon as possible: Allergic reactions--skin rash, itching, hives, swelling of the face, lips, tongue, or throat Heart attack--pain or tightness in the chest, shoulders, arms, or jaw, nausea, shortness of breath, cold or clammy skin, feeling faint or lightheaded Heart failure--shortness of breath, swelling of the ankles, feet, or hands, sudden weight gain, unusual weakness or fatigue Heart rhythm changes--fast or irregular heartbeat, dizziness, feeling faint or lightheaded, chest pain, trouble breathing High ammonia level--unusual weakness or fatigue, confusion, loss of appetite, nausea, vomiting, seizures Infection--fever, chills, cough, sore throat, wounds that don't heal, pain or trouble when passing urine, general feeling of discomfort or  being unwell Low red blood cell level--unusual weakness or fatigue, dizziness, headache, trouble breathing Pain, tingling, or numbness in the hands or feet, muscle weakness, change in vision, confusion or trouble speaking, loss of balance or coordination, trouble walking, seizures Redness, swelling, and blistering of the skin over hands and feet Severe or prolonged diarrhea Unusual bruising or bleeding Side effects that usually do not require medical attention (report to your care team if they continue or are bothersome): Dry skin Headache Increased tears Nausea Pain, redness, or swelling with sores inside the mouth or throat Sensitivity  to light Vomiting This list may not describe all possible side effects. Call your doctor for medical advice about side effects. You may report side effects to FDA at 1-800-FDA-1088. Where should I keep my medication? This medication is given in a hospital or clinic. It will not be stored at home. NOTE: This sheet is a summary. It may not cover all possible information. If you have questions about this medicine, talk to your doctor, pharmacist, or health care provider.  2024 Elsevier/Gold Standard (2022-04-04 00:00:00)        To help prevent nausea and vomiting after your treatment, we encourage you to take your nausea medication as directed.  BELOW ARE SYMPTOMS THAT SHOULD BE REPORTED IMMEDIATELY: *FEVER GREATER THAN 100.4 F (38 C) OR HIGHER *CHILLS OR SWEATING *NAUSEA AND VOMITING THAT IS NOT CONTROLLED WITH YOUR NAUSEA MEDICATION *UNUSUAL SHORTNESS OF BREATH *UNUSUAL BRUISING OR BLEEDING *URINARY PROBLEMS (pain or burning when urinating, or frequent urination) *BOWEL PROBLEMS (unusual diarrhea, constipation, pain near the anus) TENDERNESS IN MOUTH AND THROAT WITH OR WITHOUT PRESENCE OF ULCERS (sore throat, sores in mouth, or a toothache) UNUSUAL RASH, SWELLING OR PAIN  UNUSUAL VAGINAL DISCHARGE OR ITCHING   Items with * indicate a potential  emergency and should be followed up as soon as possible or go to the Emergency Department if any problems should occur.  Please show the CHEMOTHERAPY ALERT CARD or IMMUNOTHERAPY ALERT CARD at check-in to the Emergency Department and triage nurse.  Should you have questions after your visit or need to cancel or reschedule your appointment, please contact [facility] - A DEPT OF [facility] [phone]  and follow the prompts.  Office hours are 8:00 a.m. to 4:30 p.m. Monday - Friday. Please note that voicemails left after 4:00 p.m. may not be returned until the following business day.  We are closed weekends and major holidays. You have access to a nurse at all times for urgent questions. Please call the main number to the clinic [phone] and follow the prompts.  For any non-urgent questions, you may also contact your provider using MyChart. We now offer e-Visits for anyone 89 and older to request care online for non-urgent symptoms. For details visit [email or web address].   Also download the MyChart app! Go to the app store, search "MyChart", open the app, select [place], and log in with your MyChart username and password.

## 2023-10-29 NOTE — Progress Notes (Signed)
Maintain dose of 5FU continuous pump at 3500 mg despite slight weight gain.  Dr Carilyn Goodpasture, PharmD

## 2023-10-30 LAB — CEA: CEA: 159 ng/mL — ABNORMAL HIGH (ref 0.0–4.7)

## 2023-10-31 ENCOUNTER — Inpatient Hospital Stay: Payer: Medicare Other

## 2023-10-31 VITALS — BP 108/68 | HR 68 | Temp 98.2°F | Resp 18

## 2023-10-31 DIAGNOSIS — Z5112 Encounter for antineoplastic immunotherapy: Secondary | ICD-10-CM | POA: Diagnosis not present

## 2023-10-31 DIAGNOSIS — Z5189 Encounter for other specified aftercare: Secondary | ICD-10-CM | POA: Diagnosis not present

## 2023-10-31 DIAGNOSIS — C7801 Secondary malignant neoplasm of right lung: Secondary | ICD-10-CM | POA: Diagnosis not present

## 2023-10-31 DIAGNOSIS — Z95828 Presence of other vascular implants and grafts: Secondary | ICD-10-CM

## 2023-10-31 DIAGNOSIS — C7802 Secondary malignant neoplasm of left lung: Secondary | ICD-10-CM | POA: Diagnosis not present

## 2023-10-31 DIAGNOSIS — C180 Malignant neoplasm of cecum: Secondary | ICD-10-CM

## 2023-10-31 DIAGNOSIS — C7972 Secondary malignant neoplasm of left adrenal gland: Secondary | ICD-10-CM | POA: Diagnosis not present

## 2023-10-31 DIAGNOSIS — Z5111 Encounter for antineoplastic chemotherapy: Secondary | ICD-10-CM | POA: Diagnosis not present

## 2023-10-31 MED ORDER — PEGFILGRASTIM-CBQV 6 MG/0.6ML ~~LOC~~ SOSY
6.0000 mg | PREFILLED_SYRINGE | Freq: Once | SUBCUTANEOUS | Status: AC
Start: 2023-10-31 — End: 2023-10-31
  Administered 2023-10-31: 6 mg via SUBCUTANEOUS

## 2023-10-31 MED ORDER — SODIUM CHLORIDE 0.9% FLUSH
10.0000 mL | INTRAVENOUS | Status: DC | PRN
Start: 2023-10-31 — End: 2023-10-31
  Administered 2023-10-31: 10 mL

## 2023-10-31 MED ORDER — HEPARIN SOD (PORK) LOCK FLUSH 100 UNIT/ML IV SOLN
500.0000 [IU] | Freq: Once | INTRAVENOUS | Status: AC | PRN
  Administered 2023-10-31: 500 [IU]

## 2023-10-31 NOTE — Progress Notes (Signed)
Patient presents today for pump d/c and udenyca injection per MD orders.  He denies having any issues over the last 2 days.  His pump was off when he arrived.  He had zero left to infuse.  His pump was d/c and port flushed without incidence.  Patient received udenyca injection in right upper arm.  He tolerated treatment well without incidence. He was discharged ambulatory and in stable condition from clinic.  He will follow up as scheduled.

## 2023-11-05 ENCOUNTER — Ambulatory Visit: Payer: Medicare Other

## 2023-11-05 ENCOUNTER — Inpatient Hospital Stay: Payer: Medicare Other

## 2023-11-07 ENCOUNTER — Inpatient Hospital Stay: Payer: Medicare Other

## 2023-11-13 ENCOUNTER — Other Ambulatory Visit: Payer: Self-pay | Admitting: *Deleted

## 2023-11-13 ENCOUNTER — Inpatient Hospital Stay: Payer: Medicare Other | Attending: Hematology

## 2023-11-13 ENCOUNTER — Inpatient Hospital Stay: Payer: Medicare Other

## 2023-11-13 VITALS — BP 139/95 | HR 88 | Temp 96.6°F | Resp 20 | Wt 185.0 lb

## 2023-11-13 DIAGNOSIS — C7801 Secondary malignant neoplasm of right lung: Secondary | ICD-10-CM | POA: Diagnosis not present

## 2023-11-13 DIAGNOSIS — Z95828 Presence of other vascular implants and grafts: Secondary | ICD-10-CM

## 2023-11-13 DIAGNOSIS — Z5112 Encounter for antineoplastic immunotherapy: Secondary | ICD-10-CM | POA: Insufficient documentation

## 2023-11-13 DIAGNOSIS — C7972 Secondary malignant neoplasm of left adrenal gland: Secondary | ICD-10-CM | POA: Diagnosis not present

## 2023-11-13 DIAGNOSIS — Z5189 Encounter for other specified aftercare: Secondary | ICD-10-CM | POA: Diagnosis not present

## 2023-11-13 DIAGNOSIS — C7802 Secondary malignant neoplasm of left lung: Secondary | ICD-10-CM | POA: Diagnosis not present

## 2023-11-13 DIAGNOSIS — C180 Malignant neoplasm of cecum: Secondary | ICD-10-CM | POA: Insufficient documentation

## 2023-11-13 DIAGNOSIS — Z5111 Encounter for antineoplastic chemotherapy: Secondary | ICD-10-CM | POA: Diagnosis not present

## 2023-11-13 LAB — MAGNESIUM: Magnesium: 1.7 mg/dL (ref 1.7–2.4)

## 2023-11-13 LAB — CBC WITH DIFFERENTIAL/PLATELET
Abs Immature Granulocytes: 0.1 10*3/uL — ABNORMAL HIGH (ref 0.00–0.07)
Basophils Absolute: 0.4 10*3/uL — ABNORMAL HIGH (ref 0.0–0.1)
Basophils Relative: 3 %
Eosinophils Absolute: 0.7 10*3/uL — ABNORMAL HIGH (ref 0.0–0.5)
Eosinophils Relative: 5 %
HCT: 32.7 % — ABNORMAL LOW (ref 39.0–52.0)
Hemoglobin: 10.5 g/dL — ABNORMAL LOW (ref 13.0–17.0)
Lymphocytes Relative: 15 %
Lymphs Abs: 2.2 10*3/uL (ref 0.7–4.0)
MCH: 32.1 pg (ref 26.0–34.0)
MCHC: 32.1 g/dL (ref 30.0–36.0)
MCV: 100 fL (ref 80.0–100.0)
Metamyelocytes Relative: 1 %
Monocytes Absolute: 1 10*3/uL (ref 0.1–1.0)
Monocytes Relative: 7 %
Neutro Abs: 10 10*3/uL — ABNORMAL HIGH (ref 1.7–7.7)
Neutrophils Relative %: 69 %
Platelets: 182 10*3/uL (ref 150–400)
RBC: 3.27 MIL/uL — ABNORMAL LOW (ref 4.22–5.81)
RDW: 16.1 % — ABNORMAL HIGH (ref 11.5–15.5)
WBC: 14.5 10*3/uL — ABNORMAL HIGH (ref 4.0–10.5)
nRBC: 0.1 % (ref 0.0–0.2)

## 2023-11-13 LAB — URINALYSIS, DIPSTICK ONLY
Bilirubin Urine: NEGATIVE
Glucose, UA: NEGATIVE mg/dL
Hgb urine dipstick: NEGATIVE
Ketones, ur: NEGATIVE mg/dL
Leukocytes,Ua: NEGATIVE
Nitrite: NEGATIVE
Specific Gravity, Urine: >1.03 — ABNORMAL HIGH (ref 1.005–1.030)
pH: 6 (ref 5.0–8.0)

## 2023-11-13 LAB — COMPREHENSIVE METABOLIC PANEL
ALT: 9 U/L (ref 0–44)
AST: 19 U/L (ref 15–41)
Albumin: 3.4 g/dL — ABNORMAL LOW (ref 3.5–5.0)
Alkaline Phosphatase: 89 U/L (ref 38–126)
Anion gap: 11 (ref 5–15)
BUN: 7 mg/dL — ABNORMAL LOW (ref 8–23)
CO2: 21 mmol/L — ABNORMAL LOW (ref 22–32)
Calcium: 8.8 mg/dL — ABNORMAL LOW (ref 8.9–10.3)
Chloride: 105 mmol/L (ref 98–111)
Creatinine, Ser: 1.06 mg/dL (ref 0.61–1.24)
GFR, Estimated: >60 mL/min (ref >60)
Glucose, Bld: 143 mg/dL — ABNORMAL HIGH (ref 70–99)
Potassium: 3.1 mmol/L — ABNORMAL LOW (ref 3.5–5.1)
Sodium: 137 mmol/L (ref 135–145)
Total Bilirubin: 0.3 mg/dL (ref <1.2)
Total Protein: 7.1 g/dL (ref 6.5–8.1)

## 2023-11-13 MED ORDER — ATROPINE SULFATE 1 MG/ML IV SOLN
1.0000 mg | Freq: Once | INTRAVENOUS | Status: AC
  Administered 2023-11-13: 1 mg via INTRAVENOUS
  Filled 2023-11-13: qty 1

## 2023-11-13 MED ORDER — SODIUM CHLORIDE 0.9 % IV SOLN
144.0000 mg/m2 | Freq: Once | INTRAVENOUS | Status: AC
  Administered 2023-11-13: 300 mg via INTRAVENOUS
  Filled 2023-11-13: qty 15

## 2023-11-13 MED ORDER — FLUOROURACIL CHEMO INJECTION 2.5 GM/50ML
320.0000 mg/m2 | Freq: Once | INTRAVENOUS | Status: AC
  Administered 2023-11-13: 600 mg via INTRAVENOUS
  Filled 2023-11-13: qty 12

## 2023-11-13 MED ORDER — POTASSIUM CHLORIDE CRYS ER 20 MEQ PO TBCR
40.0000 meq | EXTENDED_RELEASE_TABLET | Freq: Once | ORAL | Status: AC
  Administered 2023-11-13: 40 meq via ORAL
  Filled 2023-11-13: qty 2

## 2023-11-13 MED ORDER — SODIUM CHLORIDE 0.9 % IV SOLN
Freq: Once | INTRAVENOUS | Status: AC
Start: 2023-11-13 — End: 2023-11-13

## 2023-11-13 MED ORDER — OXYCODONE HCL 10 MG PO TABS: 10.0000 mg | ORAL_TABLET | ORAL | 0 refills | Status: DC | PRN

## 2023-11-13 MED ORDER — DEXAMETHASONE SODIUM PHOSPHATE 10 MG/ML IJ SOLN
10.0000 mg | Freq: Once | INTRAMUSCULAR | Status: AC
Start: 2023-11-13 — End: 2023-11-13
  Administered 2023-11-13: 10 mg via INTRAVENOUS
  Filled 2023-11-13: qty 1

## 2023-11-13 MED ORDER — SODIUM CHLORIDE 0.9% FLUSH
10.0000 mL | INTRAVENOUS | Status: AC
  Administered 2023-11-13: 10 mL

## 2023-11-13 MED ORDER — SODIUM CHLORIDE 0.9 % IV SOLN: 10.0000 mg | Freq: Once | INTRAVENOUS | Status: DC

## 2023-11-13 MED ORDER — SODIUM CHLORIDE 0.9 % IV SOLN
1920.0000 mg/m2 | INTRAVENOUS | Status: DC
  Administered 2023-11-13: 3500 mg via INTRAVENOUS
  Filled 2023-11-13: qty 70

## 2023-11-13 MED ORDER — PALONOSETRON HCL INJECTION 0.25 MG/5ML
0.2500 mg | Freq: Once | INTRAVENOUS | Status: AC
  Administered 2023-11-13: 0.25 mg via INTRAVENOUS
  Filled 2023-11-13: qty 5

## 2023-11-13 MED ORDER — SODIUM CHLORIDE 0.9 % IV SOLN
150.0000 mg | Freq: Once | INTRAVENOUS | Status: AC
  Administered 2023-11-13: 150 mg via INTRAVENOUS
  Filled 2023-11-13: qty 150

## 2023-11-13 MED ORDER — SODIUM CHLORIDE 0.9 % IV SOLN
5.0000 mg/kg | Freq: Once | INTRAVENOUS | Status: AC
  Administered 2023-11-13: 400 mg via INTRAVENOUS
  Filled 2023-11-13: qty 16

## 2023-11-13 MED ORDER — POTASSIUM CHLORIDE CRYS ER 20 MEQ PO TBCR: 20.0000 meq | EXTENDED_RELEASE_TABLET | Freq: Every day | ORAL | 4 refills | Status: DC

## 2023-11-13 MED ORDER — SODIUM CHLORIDE 0.9 % IV SOLN
400.0000 mg/m2 | Freq: Once | INTRAVENOUS | Status: AC
  Administered 2023-11-13: 780 mg via INTRAVENOUS
  Filled 2023-11-13: qty 39

## 2023-11-13 NOTE — Progress Notes (Signed)
Patient tolerated chemotherapy with no complaints voiced.  Side effects with management reviewed with understanding verbalized.  Port site clean and dry with no bruising or swelling noted at site.  Good blood return noted before and after administration of chemotherapy.  5FU pump started for home use. Pt due to return to clinic 11/15/23. Patient left in satisfactory condition with VSS and no s/s of distress noted. All follow ups as scheduled.   Evan [name] [employer]

## 2023-11-13 NOTE — Progress Notes (Signed)
Maintain dose of 5FU continuous pump at 3500 mg.   Dr Carilyn Goodpasture, PharmD

## 2023-11-13 NOTE — Patient Instructions (Signed)
[facility] [place] [facility]  Discharge Instructions: Thank you for choosing [facility] to provide your oncology and hematology care.  If you have a lab appointment with the Cancer Center - please note that after April 8th, 2024, all labs will be drawn in the cancer center.  You do not have to check in or register with the main entrance as you have in the past but will complete your check-in in the cancer center.  Wear comfortable clothing and clothing appropriate for easy access to any Portacath or PICC line.   We strive to give you quality time with your provider. You may need to reschedule your appointment if you arrive late (15 or more minutes).  Arriving late affects you and other patients whose appointments are after yours.  Also, if you miss three or more appointments without notifying the office, you may be dismissed from the clinic at the provider's discretion.      For prescription refill requests, have your pharmacy contact our office and allow 72 hours for refills to be completed.    Today you received the following chemotherapy and/or immunotherapy agents Vegzelma, irinotecan, Leucovorin, Adrucil   To help prevent nausea and vomiting after your treatment, we encourage you to take your nausea medication as directed.  BELOW ARE SYMPTOMS THAT SHOULD BE REPORTED IMMEDIATELY: *FEVER GREATER THAN 100.4 F (38 C) OR HIGHER *CHILLS OR SWEATING *NAUSEA AND VOMITING THAT IS NOT CONTROLLED WITH YOUR NAUSEA MEDICATION *UNUSUAL SHORTNESS OF BREATH *UNUSUAL BRUISING OR BLEEDING *URINARY PROBLEMS (pain or burning when urinating, or frequent urination) *BOWEL PROBLEMS (unusual diarrhea, constipation, pain near the anus) TENDERNESS IN MOUTH AND THROAT WITH OR WITHOUT PRESENCE OF ULCERS (sore throat, sores in mouth, or a toothache) UNUSUAL RASH, SWELLING OR PAIN  UNUSUAL VAGINAL DISCHARGE OR ITCHING   Items with * indicate a potential  emergency and should be followed up as soon as possible or go to the Emergency Department if any problems should occur.  Please show the CHEMOTHERAPY ALERT CARD or IMMUNOTHERAPY ALERT CARD at check-in to the Emergency Department and triage nurse.  Should you have questions after your visit or need to cancel or reschedule your appointment, please contact [facility] [place] - A DEPT OF [facility] [phone]  and follow the prompts.  Office hours are 8:00 a.m. to 4:30 p.m. Monday - Friday. Please note that voicemails left after 4:00 p.m. may not be returned until the following business day.  We are closed weekends and major holidays. You have access to a nurse at all times for urgent questions. Please call the main number to the clinic [phone] and follow the prompts.  For any non-urgent questions, you may also contact your provider using MyChart. We now offer e-Visits for anyone 54 and older to request care online for non-urgent symptoms. For details visit [email or web address].   Also download the MyChart app! Go to the app store, search "MyChart", open the app, select [place], and log in with your MyChart username and password.

## 2023-11-15 ENCOUNTER — Inpatient Hospital Stay: Payer: Medicare Other

## 2023-11-15 VITALS — BP 115/77 | HR 77 | Temp 96.8°F | Resp 16

## 2023-11-15 DIAGNOSIS — C180 Malignant neoplasm of cecum: Secondary | ICD-10-CM

## 2023-11-15 DIAGNOSIS — Z5112 Encounter for antineoplastic immunotherapy: Secondary | ICD-10-CM | POA: Diagnosis not present

## 2023-11-15 DIAGNOSIS — C7802 Secondary malignant neoplasm of left lung: Secondary | ICD-10-CM | POA: Diagnosis not present

## 2023-11-15 DIAGNOSIS — Z5189 Encounter for other specified aftercare: Secondary | ICD-10-CM | POA: Diagnosis not present

## 2023-11-15 DIAGNOSIS — C7972 Secondary malignant neoplasm of left adrenal gland: Secondary | ICD-10-CM | POA: Diagnosis not present

## 2023-11-15 DIAGNOSIS — C7801 Secondary malignant neoplasm of right lung: Secondary | ICD-10-CM | POA: Diagnosis not present

## 2023-11-15 DIAGNOSIS — Z95828 Presence of other vascular implants and grafts: Secondary | ICD-10-CM

## 2023-11-15 DIAGNOSIS — Z5111 Encounter for antineoplastic chemotherapy: Secondary | ICD-10-CM | POA: Diagnosis not present

## 2023-11-15 MED ORDER — SODIUM CHLORIDE 0.9% FLUSH
10.0000 mL | INTRAVENOUS | Status: DC | PRN
  Administered 2023-11-15: 10 mL

## 2023-11-15 MED ORDER — PEGFILGRASTIM-CBQV 6 MG/0.6ML ~~LOC~~ SOSY
6.0000 mg | PREFILLED_SYRINGE | Freq: Once | SUBCUTANEOUS | Status: AC
  Administered 2023-11-15: 6 mg via SUBCUTANEOUS
  Filled 2023-11-15: qty 0.6

## 2023-11-15 MED ORDER — HEPARIN SOD (PORK) LOCK FLUSH 100 UNIT/ML IV SOLN
500.0000 [IU] | Freq: Once | INTRAVENOUS | Status: AC | PRN
  Administered 2023-11-15: 500 [IU]

## 2023-11-15 NOTE — Progress Notes (Signed)
Evan [name] presents to have home infusion pump d/c'd and for port-a-cath deaccess with flush.  Portacath located right  chest wall accessed with  H 20 needle.  Good blood return present. Portacath flushed with NS and 500U/91ml Heparin, and needle removed intact.  Procedure tolerated well and without incident.  Udenyca injection give per orders. See MAR for details

## 2023-11-15 NOTE — Patient Instructions (Signed)
[facility] [place] [facility]  Discharge Instructions: Thank you for choosing [facility] to provide your oncology and hematology care.  If you have a lab appointment with the Cancer Center - please note that after April 8th, 2024, all labs will be drawn in the cancer center.  You do not have to check in or register with the main entrance as you have in the past but will complete your check-in in the cancer center.  Wear comfortable clothing and clothing appropriate for easy access to any Portacath or PICC line.   We strive to give you quality time with your provider. You may need to reschedule your appointment if you arrive late (15 or more minutes).  Arriving late affects you and other patients whose appointments are after yours.  Also, if you miss three or more appointments without notifying the office, you may be dismissed from the clinic at the provider's discretion.      For prescription refill requests, have your pharmacy contact our office and allow 72 hours for refills to be completed.    Today you had your ambulatory pump disconnected per protocol.    To help prevent nausea and vomiting after your treatment, we encourage you to take your nausea medication as directed.  BELOW ARE SYMPTOMS THAT SHOULD BE REPORTED IMMEDIATELY: *FEVER GREATER THAN 100.4 F (38 C) OR HIGHER *CHILLS OR SWEATING *NAUSEA AND VOMITING THAT IS NOT CONTROLLED WITH YOUR NAUSEA MEDICATION *UNUSUAL SHORTNESS OF BREATH *UNUSUAL BRUISING OR BLEEDING *URINARY PROBLEMS (pain or burning when urinating, or frequent urination) *BOWEL PROBLEMS (unusual diarrhea, constipation, pain near the anus) TENDERNESS IN MOUTH AND THROAT WITH OR WITHOUT PRESENCE OF ULCERS (sore throat, sores in mouth, or a toothache) UNUSUAL RASH, SWELLING OR PAIN  UNUSUAL VAGINAL DISCHARGE OR ITCHING   Items with * indicate a potential emergency and should be followed up as soon as possible or  go to the Emergency Department if any problems should occur.  Please show the CHEMOTHERAPY ALERT CARD or IMMUNOTHERAPY ALERT CARD at check-in to the Emergency Department and triage nurse.  Should you have questions after your visit or need to cancel or reschedule your appointment, please contact [facility] [place] - A DEPT OF [facility] [phone]  and follow the prompts.  Office hours are 8:00 a.m. to 4:30 p.m. Monday - Friday. Please note that voicemails left after 4:00 p.m. may not be returned until the following business day.  We are closed weekends and major holidays. You have access to a nurse at all times for urgent questions. Please call the main number to the clinic [phone] and follow the prompts.  For any non-urgent questions, you may also contact your provider using MyChart. We now offer e-Visits for anyone 45 and older to request care online for non-urgent symptoms. For details visit [email or web address].   Also download the MyChart app! Go to the app store, search "MyChart", open the app, select , and log in with your MyChart username and password.

## 2023-11-19 ENCOUNTER — Inpatient Hospital Stay: Payer: Medicare Other

## 2023-11-21 ENCOUNTER — Inpatient Hospital Stay: Payer: Medicare Other

## 2023-11-25 NOTE — Progress Notes (Incomplete)
[facility] [address], [place] [address]    Clinic Day:  11/25/2023  Referring physician: [name], PA  Patient Care Team: [name], [name] as PCP - General (Physician Assistant) [name], MD as Medical Oncologist (Medical Oncology) Therese Sarah, RN as Oncology Nurse Navigator (Medical Oncology)   ASSESSMENT & PLAN:   Assessment: 1. Left lung mass with multiple lung nodules: - Presentation with dry cough for 6 months. - 30 pound weight loss in the last couple of years, weight stable over the last 6 months. - CT chest with contrast on 12/16/2021 showed bulky left hilar mass measuring 8.1 x 7.5 cm.  Numerous bilateral lung nodules of varying sizes.  Left adrenal nodule measuring 2.8 x 2.2 cm.  Mediastinal and bilateral hilar adenopathy with the largest pretracheal node measuring 4.1 x 3.1 cm. - MRI of the brain from 01/04/2022 which was negative for metastatic disease. - CTAP from 01/03/2022 which showed isolated left adrenal metastasis with no other evidence of metastatic disease in the abdomen or pelvis. - Pathology of left lung biopsy which shows adenocarcinoma with necrosis.  CK20 positive and CDX2 positive but negative for CK7 indicating colonic primary. 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  HER2 negative.  TMB low.  MSI-stable.  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  Other targetable mutations negative. - FOLFIRI started on 02/22/2022, bevacizumab added with cycle 4  - CT CAP (05/17/2022): Mediastinal and hilar lymph nodes have decreased in size.  Largest perihilar left lower lobe lung mass has decreased in size.  Right upper lobe mass slightly increased in size.  Other nodules are stable.  Left adrenal mass decreased to 1.3 cm from 2.8 cm. - CT scan showed mixed response as it was compared to CT from 12/16/2021.  He did not start chemotherapy until 02/22/2022. - Maintenance 5-FU and bevacizumab from September 2023 through 08/29/2023 with  progression - FOLFIRI and bevacizumab started on 09/12/2023   2. Social/family history: - Lives by himself.  He paints houses for living. - He quit smoking 12 years back and started back again 1 and half year ago and smoked half pack per day.  He quit again about 1 week ago. - Father had cancer, type unknown to the patient.  Brother died of brain tumor.  3.  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  Margins negative.  No lymphovascular or perineural invasion. - Received 3 cycles of XELOX followed by Xeloda for total of 6 months.  Oxaliplatin discontinued during cycle 4 secondary to transaminitis, elevated bilirubin and thrombocytopenia.  However he was also treated for hep C with Harvoni after that.    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 - He had mucositis last week.  Magic mouthwash helped. - Reviewed labs today: Normal LFTs.  CBC grossly normal.  White count is elevated from G-CSF.  UA shows protein 30. - Recommend continuing FOLFIRI and bevacizumab every 2 weeks.  RTC 6 weeks for follow-up.  He wants to go to [place] for work for couple of weeks in November.  He will let us know.  2.  Lower rib/epigastric pain: - Continue oxycodone 10 mg every 4 hours as needed.  3.  Difficulty falling asleep: - Continue trazodone as needed.  Not requiring on regular basis.  4.  Hypertension: - Continue amlodipine 10 mg daily.  Blood pressure is better controlled.   Addendum: - He developed nausea and vomiting and abdominal cramping during irinotecan infusion. - We have given Ativan and atropine which helped. - We will add Emend to the premeds.  Will also add Ativan 1 mg to premeds starting next cycle.    No orders of the defined types were placed in this encounter.     I,Katie  Daubenspeck,acting as a Neurosurgeon for [name], MD.,have documented all relevant documentation on the behalf of [name], MD,as directed by  [name], MD while in the presence of [name], MD.   ***  Katie Daubenspeck   12/15/20248:54 PM  CHIEF COMPLAINT:   Diagnosis: metastatic colon cancer to the lungs and left adrenal gland   Cancer Staging  Cecal cancer [facility]) Staging form: Colon and Rectum, AJCC 8th Edition - Clinical stage from 10/07/2019: Stage IIIB (cT3, cN1, cM0) - Signed by [name], MD on 10/07/2019 - Pathologic stage from 01/23/2022: Stage IVB (rpTX, pN0, pM1b) - Unsigned    Prior Therapy: 1. FOLFIRI 02/22/22 - 08/08/22  2. Maintenance 5-FU and bevacizumab, 08/2022 - 08/29/23  Current Therapy:  FOLFIRI and bevacizumab   HISTORY OF PRESENT ILLNESS:   Oncology History  Cecal cancer (HCC)  10/07/2019 Initial Diagnosis   Cecal cancer (HCC)   10/07/2019 Cancer Staging   Staging form: Colon and Rectum, AJCC 8th Edition - Clinical stage from 10/07/2019: Stage IIIB (cT3, cN1, cM0) - Signed by [name], MD on 10/07/2019   02/22/2022 - 08/08/2022 Chemotherapy   Patient is on Treatment Plan : COLORECTAL FOLFIRI / BEVACIZUMAB Q14D     02/22/2022 -  Chemotherapy   Patient is on Treatment Plan : COLORECTAL FOLFIRI + Bevacizumab q14d        INTERVAL HISTORY:   Evan [name] is a 67 y.o. male presenting to clinic today for follow up of metastatic colon cancer. He was last seen by me on 10/09/23.  Today, he states that he is doing well overall. His appetite level is at ***%. His energy level is at ***%.  PAST MEDICAL HISTORY:   Past Medical History: Past Medical History:  Diagnosis Date   Arthritis    Colon cancer (HCC)    colon   Hepatitis C    Hypertension    Port-A-Cath in place 02/15/2022    Surgical History: Past Surgical History:  Procedure Laterality Date   BIOPSY  03/12/2023   Procedure: BIOPSY;   Surgeon: Lanelle Bal, DO;  [facility];  Service: Endoscopy;;   COLON SURGERY     ESOPHAGEAL BANDING N/A 03/12/2023   Procedure: ESOPHAGEAL BANDING;  Surgeon: Lanelle Bal, DO;  Location[facility];  Service: Endoscopy;  Laterality: N/A;   ESOPHAGOGASTRODUODENOSCOPY (EGD) WITH PROPOFOL N/A 03/12/2023   Procedure: ESOPHAGOGASTRODUODENOSCOPY (EGD) WITH PROPOFOL;  Surgeon: Lanelle Bal, DO;  Location: [facility];  Service: Endoscopy;  Laterality: N/A;  11:30AM; ASA 3   PORTACATH PLACEMENT Right 02/08/2022   Procedure: INSERTION PORT-A-CATH- RIJ;  Surgeon: Lewie Chamber, DO;  Location: [facility];  Service: General;  Laterality: Right;   REPLACEMENT TOTAL KNEE Left    SHOULDER ARTHROSCOPY Bilateral    TOTAL HIP ARTHROPLASTY Right 11/09/2020   Procedure: TOTAL HIP ARTHROPLASTY ANTERIOR APPROACH;  Surgeon: Sheral Apley, MD;  Location: [facility];  Service: Orthopedics;  Laterality: Right;   WRIST SURGERY Left     Social History: Social History  Socioeconomic History   Marital status: Married    Spouse name: Not on file   Number of children: 7   Years of education: Not on file   Highest education level: Not on file  Occupational History   Occupation: employed  Tobacco Use   Smoking status: Former    Current packs/day: 0.00    Average packs/day: 1.5 packs/day for 20.0 years (30.0 ttl pk-yrs)    Types: Cigarettes    Start date: 11/19/1985    Quit date: 11/19/2005    Years since quitting: 18.0   Smokeless tobacco: Never  Vaping Use   Vaping status: Never Used  Substance and Sexual Activity   Alcohol use: Not Currently   Drug use: Never   Sexual activity: Yes  Other Topics Concern   Not on file  Social History Narrative   ** Merged History Encounter **       Separated from wife 11/2021   Social Drivers of Health   Financial Resource Strain: Low Risk  (10/07/2019)   Overall Financial Resource Strain (CARDIA)    Difficulty of Paying Living  Expenses: Not very hard  Food Insecurity: No Food Insecurity (10/07/2019)   Hunger Vital Sign    Worried About Running Out of Food in the Last Year: Never true    Ran Out of Food in the Last Year: Never true  Transportation Needs: No Transportation Needs (10/07/2019)   PRAPARE - Administrator, Civil Service (Medical): No    Lack of Transportation (Non-Medical): No  Physical Activity: Inactive (10/07/2019)   Exercise Vital Sign    Days of Exercise per Week: 0 days    Minutes of Exercise per Session: 0 min  Stress: No Stress Concern Present (10/07/2019)   [employer] of Occupational Health - Occupational Stress Questionnaire    Feeling of Stress : Not at all  Social Connections: Moderately Integrated (10/07/2019)   Social Connection and Isolation Panel [NHANES]    Frequency of Communication with Friends and Family: Once a week    Frequency of Social Gatherings with Friends and Family: Once a week    Attends Religious Services: More than 4 times per year    Active Member of [employer] or Organizations: Yes    Attends Engineer, structural: More than 4 times per year    Marital Status: Married  Catering manager Violence: Not At Risk (10/07/2019)   Humiliation, Afraid, Rape, and Kick questionnaire    Fear of Current or Ex-Partner: No    Emotionally Abused: No    Physically Abused: No    Sexually Abused: No    Family History: Family History  Problem Relation Age of Onset   Heart disease Mother    Dementia Father    Heart disease Sister    Cancer Brother    Hypertension Brother    Hypertension Brother    Healthy Son    Healthy Son    Healthy Son    Healthy Son    Healthy Daughter    Healthy Daughter    Healthy Daughter     Current Medications:  Current Outpatient Medications:    aluminum-magnesium hydroxide-simethicone (MAALOX) 200-200-20 MG/5ML SUSP, Take 30 mLs by mouth 4 (four) times daily -  before meals and at bedtime., Disp: 1680 mL, Rfl: 2    amLODipine (NORVASC) 10 MG tablet, Take 1 tablet (10 mg total) by mouth daily., Disp: 90 tablet, Rfl: 3   Bevacizumab (AVASTIN IV), Inject into the vein every 14 (fourteen) days. *  start date TBD, Disp: , Rfl:    ENULOSE 10 GM/15ML SOLN, Take 10 g by mouth daily as needed., Disp: , Rfl:    fluorouracil CALGB 52841 2,400 mg/m2 in sodium chloride 0.9 % 150 mL, Inject 2,400 mg/m2 into the vein over 48 hr., Disp: , Rfl:    FLUOROURACIL IV, Inject into the vein every 14 (fourteen) days., Disp: , Rfl:    Lactulose 20 GM/30ML SOLN, Take 15 mLs (10 g total) by mouth at bedtime. Take 15 ml at bedtime every night to assist with regular bowel movements.  Titrate down if having multiple bowel movements.  If a bowel movement has not occurred in 3 to 4 days or longer, then take 15 ml every 3 hours until a bowel movent has occurred., Disp: 450 mL, Rfl: 5   LEUCOVORIN CALCIUM IV, Inject into the vein every 14 (fourteen) days., Disp: , Rfl:    lidocaine-prilocaine (EMLA) cream, Apply 1 Application topically as needed (Apply to port site 30 mins- 1 hour prior to treatment/labs.)., Disp: 30 g, Rfl: 0   loperamide (IMODIUM) 2 MG capsule, Take 1 capsule (2 mg total) by mouth as needed for diarrhea or loose stools (Take 2 capsules after first loose stool and then 1 capsule after each loose stool, but do not exceed more than 8 capsules in a 24 hour period)., Disp: 30 capsule, Rfl: 0   megestrol (MEGACE) 400 MG/10ML suspension, Take 10 mLs (400 mg total) by mouth 2 (two) times daily., Disp: 480 mL, Rfl: 3   Misc. Devices MISC, Please provide patient with 1:1 ratio of magic mouthwash and lidocaine to swish and swallow QID, Disp: 1 each, Rfl: 3   Oxycodone HCl 10 MG TABS, Take 1 tablet (10 mg total) by mouth every 4 (four) hours as needed., Disp: 180 tablet, Rfl: 0   pantoprazole (PROTONIX) 40 MG tablet, Take 1 tablet (40 mg total) by mouth daily., Disp: 30 tablet, Rfl: 11   potassium chloride SA (KLOR-CON M) 20 MEQ tablet, Take  1 tablet (20 mEq total) by mouth daily., Disp: 90 tablet, Rfl: 4   Allergies: No Known Allergies  REVIEW OF SYSTEMS:   Review of Systems  Constitutional:  Negative for chills, fatigue and fever.  HENT:   Negative for lump/mass, mouth sores, nosebleeds, sore throat and trouble swallowing.   Eyes:  Negative for eye problems.  Respiratory:  Negative for cough and shortness of breath.   Cardiovascular:  Negative for chest pain, leg swelling and palpitations.  Gastrointestinal:  Negative for abdominal pain, constipation, diarrhea, nausea and vomiting.  Genitourinary:  Negative for bladder incontinence, difficulty urinating, dysuria, frequency, hematuria and nocturia.   Musculoskeletal:  Negative for arthralgias, back pain, flank pain, myalgias and neck pain.  Skin:  Negative for itching and rash.  Neurological:  Negative for dizziness, headaches and numbness.  Hematological:  Does not bruise/bleed easily.  Psychiatric/Behavioral:  Negative for depression, sleep disturbance and suicidal ideas. The patient is not nervous/anxious.   All other systems reviewed and are negative.    VITALS:   There were no vitals taken for this visit.  Wt Readings from Last 3 Encounters:  11/13/23 185 lb (83.9 kg)  10/29/23 186 lb 9.6 oz (84.6 kg)  10/23/23 175 lb 9.6 oz (79.7 kg)    There is no height or weight on file to calculate BMI.  Performance status (ECOG): {CHL ONC [id number]  PHYSICAL EXAM:   Physical Exam Vitals and nursing note reviewed. Exam conducted with a chaperone present.  Constitutional:      Appearance: Normal appearance.  Cardiovascular:     Rate and Rhythm: Normal rate and regular rhythm.     Pulses: Normal pulses.     Heart sounds: Normal heart sounds.  Pulmonary:     Effort: Pulmonary effort is normal.     Breath sounds: Normal breath sounds.  Abdominal:     Palpations: Abdomen is soft. There is no hepatomegaly, splenomegaly or mass.     Tenderness: There is no  abdominal tenderness.  Musculoskeletal:     Right lower leg: No edema.     Left lower leg: No edema.  Lymphadenopathy:     Cervical: No cervical adenopathy.     Right cervical: No superficial, deep or posterior cervical adenopathy.    Left cervical: No superficial, deep or posterior cervical adenopathy.     Upper Body:     Right upper body: No supraclavicular or axillary adenopathy.     Left upper body: No supraclavicular or axillary adenopathy.  Neurological:     General: No focal deficit present.     Mental Status: He is alert and oriented to person, place, and time.  Psychiatric:        Mood and Affect: Mood normal.        Behavior: Behavior normal.     LABS:      Latest Ref Rng & Units 11/13/2023    9:16 AM 10/29/2023    7:50 AM 10/23/2023    8:22 AM  CBC  WBC 4.0 - 10.5 K/uL 14.5  19.1  29.0   Hemoglobin 13.0 - 17.0 g/dL 16.1  09.6  04.5   Hematocrit 39.0 - 52.0 % 32.7  31.3  31.4   Platelets 150 - 400 K/uL 182  200  289       Latest Ref Rng & Units 11/13/2023    9:16 AM 10/29/2023    7:50 AM 10/23/2023    8:22 AM  CMP  Glucose 70 - 99 mg/dL 409  96  811   BUN 8 - 23 mg/dL 7  9  18    Creatinine 0.61 - 1.24 mg/dL 9.14  7.82  9.56   Sodium 135 - 145 mmol/L 137  136  134   Potassium 3.5 - 5.1 mmol/L 3.1  3.9  3.4   Chloride 98 - 111 mmol/L 105  105  105   CO2 22 - 32 mmol/L 21  25  20    Calcium 8.9 - 10.3 mg/dL 8.8  8.6  8.7   Total Protein 6.5 - 8.1 g/dL 7.1  6.5  7.5   Total Bilirubin <1.2 mg/dL 0.3  0.6  0.4   Alkaline Phos 38 - 126 U/L 89  66  70   AST 15 - 41 U/L 19  17  24    ALT 0 - 44 U/L 9  8  13       Lab Results  Component Value Date   CEA1 159.0 (H) 10/29/2023   /  CEA  Date Value Ref Range Status  10/29/2023 159.0 (H) 0.0 - 4.7 ng/mL Final    Comment:    (NOTE)                             Nonsmokers          <3.9  Smokers             <5.6 Roche [employer]ostics Electrochemiluminescence Immunoassay (ECLIA) Values  obtained with different assay methods or kits cannot be used interchangeably.  Results cannot be interpreted as absolute evidence of the presence or absence of malignant disease. Performed At: [facility] [address] [place], [place] [id number] Jolene Schimke MD WG:[id number]    No results found for: "PSA1" No results found for: "CAN199" No results found for: "CAN125"  No results found for: "TOTALPROTELP", "ALBUMINELP", "A1GS", "A2GS", "BETS", "BETA2SER", "GAMS", "MSPIKE", "SPEI" Lab Results  Component Value Date   TIBC 406 01/16/2023   TIBC 294 06/06/2022   TIBC 237 (L) 02/22/2022   FERRITIN 150 01/16/2023   FERRITIN 243 06/06/2022   FERRITIN 292 02/22/2022   IRONPCTSAT 18 01/16/2023   IRONPCTSAT 24 06/06/2022   IRONPCTSAT 11 (L) 02/22/2022   Lab Results  Component Value Date   LDH 258 (H) 12/26/2021     STUDIES:   No results found.

## 2023-11-26 ENCOUNTER — Inpatient Hospital Stay: Payer: Medicare Other

## 2023-11-26 ENCOUNTER — Inpatient Hospital Stay: Payer: Medicare Other | Admitting: Hematology

## 2023-11-26 VITALS — BP 131/87 | HR 55 | Temp 98.3°F | Resp 18 | Wt 188.5 lb

## 2023-11-26 DIAGNOSIS — C189 Malignant neoplasm of colon, unspecified: Secondary | ICD-10-CM

## 2023-11-26 DIAGNOSIS — E876 Hypokalemia: Secondary | ICD-10-CM

## 2023-11-26 DIAGNOSIS — C7801 Secondary malignant neoplasm of right lung: Secondary | ICD-10-CM | POA: Diagnosis not present

## 2023-11-26 DIAGNOSIS — Z95828 Presence of other vascular implants and grafts: Secondary | ICD-10-CM

## 2023-11-26 DIAGNOSIS — Z5111 Encounter for antineoplastic chemotherapy: Secondary | ICD-10-CM | POA: Diagnosis not present

## 2023-11-26 DIAGNOSIS — Z5112 Encounter for antineoplastic immunotherapy: Secondary | ICD-10-CM | POA: Diagnosis not present

## 2023-11-26 DIAGNOSIS — C7802 Secondary malignant neoplasm of left lung: Secondary | ICD-10-CM | POA: Diagnosis not present

## 2023-11-26 DIAGNOSIS — C7972 Secondary malignant neoplasm of left adrenal gland: Secondary | ICD-10-CM | POA: Diagnosis not present

## 2023-11-26 DIAGNOSIS — Z5189 Encounter for other specified aftercare: Secondary | ICD-10-CM | POA: Diagnosis not present

## 2023-11-26 DIAGNOSIS — R197 Diarrhea, unspecified: Secondary | ICD-10-CM

## 2023-11-26 DIAGNOSIS — C180 Malignant neoplasm of cecum: Secondary | ICD-10-CM

## 2023-11-26 LAB — CBC WITH DIFFERENTIAL/PLATELET
Abs Immature Granulocytes: 0.7 10*3/uL — ABNORMAL HIGH (ref 0.00–0.07)
Basophils Absolute: 0.2 10*3/uL — ABNORMAL HIGH (ref 0.0–0.1)
Basophils Relative: 1 %
Eosinophils Absolute: 0.5 10*3/uL (ref 0.0–0.5)
Eosinophils Relative: 2 %
HCT: 33.1 % — ABNORMAL LOW (ref 39.0–52.0)
Hemoglobin: 10.6 g/dL — ABNORMAL LOW (ref 13.0–17.0)
Lymphocytes Relative: 17 %
Lymphs Abs: 4 10*3/uL (ref 0.7–4.0)
MCH: 32 pg (ref 26.0–34.0)
MCHC: 32 g/dL (ref 30.0–36.0)
MCV: 100 fL (ref 80.0–100.0)
Metamyelocytes Relative: 1 %
Monocytes Absolute: 1.4 10*3/uL — ABNORMAL HIGH (ref 0.1–1.0)
Monocytes Relative: 6 %
Myelocytes: 2 %
Neutro Abs: 16.7 10*3/uL — ABNORMAL HIGH (ref 1.7–7.7)
Neutrophils Relative %: 71 %
Platelets: 150 10*3/uL (ref 150–400)
RBC: 3.31 MIL/uL — ABNORMAL LOW (ref 4.22–5.81)
RDW: 16.5 % — ABNORMAL HIGH (ref 11.5–15.5)
WBC: 23.5 10*3/uL — ABNORMAL HIGH (ref 4.0–10.5)
nRBC: 0.3 % — ABNORMAL HIGH (ref 0.0–0.2)

## 2023-11-26 LAB — URINALYSIS, DIPSTICK ONLY
Bilirubin Urine: NEGATIVE
Glucose, UA: NEGATIVE mg/dL
Hgb urine dipstick: NEGATIVE
Ketones, ur: NEGATIVE mg/dL
Leukocytes,Ua: NEGATIVE
Nitrite: NEGATIVE
Protein, ur: 100 mg/dL — AB
Specific Gravity, Urine: 1.023 (ref 1.005–1.030)
pH: 5 (ref 5.0–8.0)

## 2023-11-26 LAB — COMPREHENSIVE METABOLIC PANEL
ALT: 9 U/L (ref 0–44)
AST: 17 U/L (ref 15–41)
Albumin: 3.4 g/dL — ABNORMAL LOW (ref 3.5–5.0)
Alkaline Phosphatase: 98 U/L (ref 38–126)
Anion gap: 9 (ref 5–15)
BUN: 8 mg/dL (ref 8–23)
CO2: 22 mmol/L (ref 22–32)
Calcium: 8.9 mg/dL (ref 8.9–10.3)
Chloride: 106 mmol/L (ref 98–111)
Creatinine, Ser: 0.97 mg/dL (ref 0.61–1.24)
GFR, Estimated: >60 mL/min (ref >60)
Glucose, Bld: 102 mg/dL — ABNORMAL HIGH (ref 70–99)
Potassium: 3.4 mmol/L — ABNORMAL LOW (ref 3.5–5.1)
Sodium: 137 mmol/L (ref 135–145)
Total Bilirubin: 0.3 mg/dL (ref <1.2)
Total Protein: 7 g/dL (ref 6.5–8.1)

## 2023-11-26 LAB — MAGNESIUM: Magnesium: 1.9 mg/dL (ref 1.7–2.4)

## 2023-11-26 MED ORDER — IRINOTECAN HCL CHEMO INJECTION 100 MG/5ML
144.0000 mg/m2 | Freq: Once | INTRAVENOUS | Status: AC
  Administered 2023-11-26: 300 mg via INTRAVENOUS
  Filled 2023-11-26: qty 15

## 2023-11-26 MED ORDER — SODIUM CHLORIDE 0.9% FLUSH
10.0000 mL | Freq: Once | INTRAVENOUS | Status: AC
  Administered 2023-11-26: 10 mL via INTRAVENOUS

## 2023-11-26 MED ORDER — PALONOSETRON HCL INJECTION 0.25 MG/5ML
0.2500 mg | Freq: Once | INTRAVENOUS | Status: AC
  Administered 2023-11-26: 0.25 mg via INTRAVENOUS
  Filled 2023-11-26: qty 5

## 2023-11-26 MED ORDER — SODIUM CHLORIDE 0.9 % IV SOLN
400.0000 mg/m2 | Freq: Once | INTRAVENOUS | Status: AC
  Administered 2023-11-26: 780 mg via INTRAVENOUS
  Filled 2023-11-26: qty 39

## 2023-11-26 MED ORDER — ATROPINE SULFATE 1 MG/ML IV SOLN
1.0000 mg | Freq: Once | INTRAVENOUS | Status: AC
Start: 2023-11-26 — End: 2023-11-26
  Administered 2023-11-26: 1 mg via INTRAVENOUS
  Filled 2023-11-26: qty 1

## 2023-11-26 MED ORDER — DEXAMETHASONE SODIUM PHOSPHATE 10 MG/ML IJ SOLN
10.0000 mg | Freq: Once | INTRAMUSCULAR | Status: AC
  Administered 2023-11-26: 10 mg via INTRAVENOUS
  Filled 2023-11-26: qty 1

## 2023-11-26 MED ORDER — FLUOROURACIL CHEMO INJECTION 2.5 GM/50ML
320.0000 mg/m2 | Freq: Once | INTRAVENOUS | Status: AC
  Administered 2023-11-26: 600 mg via INTRAVENOUS
  Filled 2023-11-26: qty 12

## 2023-11-26 MED ORDER — SODIUM CHLORIDE 0.9 % IV SOLN
150.0000 mg | Freq: Once | INTRAVENOUS | Status: AC
  Administered 2023-11-26: 150 mg via INTRAVENOUS
  Filled 2023-11-26: qty 150

## 2023-11-26 MED ORDER — SODIUM CHLORIDE 0.9 % IV SOLN: Freq: Once | INTRAVENOUS | Status: AC

## 2023-11-26 MED ORDER — POTASSIUM CHLORIDE CRYS ER 20 MEQ PO TBCR
40.0000 meq | EXTENDED_RELEASE_TABLET | Freq: Once | ORAL | Status: AC
  Administered 2023-11-26: 40 meq via ORAL
  Filled 2023-11-26: qty 2

## 2023-11-26 MED ORDER — SODIUM CHLORIDE 0.9 % IV SOLN
5.0000 mg/kg | Freq: Once | INTRAVENOUS | Status: AC
  Administered 2023-11-26: 400 mg via INTRAVENOUS
  Filled 2023-11-26: qty 16

## 2023-11-26 MED ORDER — SODIUM CHLORIDE 0.9 % IV SOLN
1920.0000 mg/m2 | INTRAVENOUS | Status: DC
  Administered 2023-11-26: 3500 mg via INTRAVENOUS
  Filled 2023-11-26: qty 70

## 2023-11-26 NOTE — Progress Notes (Signed)
Potassium 3.4 today.  Potassium 40 meq by mouth standing orders Dr. Ellin Saba.   Urine protein 100! Today.  Provider made aware.  Ok to treat verbal order Dr. Ellin Saba.

## 2023-11-26 NOTE — Patient Instructions (Signed)
[facility] [place] - [facility]  Discharge Instructions: Thank you for choosing [facility] to provide your oncology and hematology care.  If you have a lab appointment with the Cancer Center - please note that after April 8th, 2024, all labs will be drawn in the cancer center.  You do not have to check in or register with the main entrance as you have in the past but will complete your check-in in the cancer center.  Wear comfortable clothing and clothing appropriate for easy access to any Portacath or PICC line.   We strive to give you quality time with your provider. You may need to reschedule your appointment if you arrive late (15 or more minutes).  Arriving late affects you and other patients whose appointments are after yours.  Also, if you miss three or more appointments without notifying the office, you may be dismissed from the clinic at the provider's discretion.      For prescription refill requests, have your pharmacy contact our office and allow 72 hours for refills to be completed.    Today you received the following chemotherapy and/or immunotherapy agents Bevacizumab, leucovorin, irrinitecan   To help prevent nausea and vomiting after your treatment, we encourage you to take your nausea medication as directed.  BELOW ARE SYMPTOMS THAT SHOULD BE REPORTED IMMEDIATELY: *FEVER GREATER THAN 100.4 F (38 C) OR HIGHER *CHILLS OR SWEATING *NAUSEA AND VOMITING THAT IS NOT CONTROLLED WITH YOUR NAUSEA MEDICATION *UNUSUAL SHORTNESS OF BREATH *UNUSUAL BRUISING OR BLEEDING *URINARY PROBLEMS (pain or burning when urinating, or frequent urination) *BOWEL PROBLEMS (unusual diarrhea, constipation, pain near the anus) TENDERNESS IN MOUTH AND THROAT WITH OR WITHOUT PRESENCE OF ULCERS (sore throat, sores in mouth, or a toothache) UNUSUAL RASH, SWELLING OR PAIN  UNUSUAL VAGINAL DISCHARGE OR ITCHING   Items with * indicate a potential emergency  and should be followed up as soon as possible or go to the Emergency Department if any problems should occur.  Please show the CHEMOTHERAPY ALERT CARD or IMMUNOTHERAPY ALERT CARD at check-in to the Emergency Department and triage nurse.  Should you have questions after your visit or need to cancel or reschedule your appointment, please contact [facility] [place] - A DEPT OF Eligha Bridegroom [facility] [phone]  and follow the prompts.  Office hours are 8:00 a.m. to 4:30 p.m. Monday - Friday. Please note that voicemails left after 4:00 p.m. may not be returned until the following business day.  We are closed weekends and major holidays. You have access to a nurse at all times for urgent questions. Please call the main number to the clinic [phone] and follow the prompts.  For any non-urgent questions, you may also contact your provider using MyChart. We now offer e-Visits for anyone 25 and older to request care online for non-urgent symptoms. For details visit [email or web address].   Also download the MyChart app! Go to the app store, search "MyChart", open the app, select [place], and log in with your MyChart username and password.

## 2023-11-26 NOTE — Progress Notes (Signed)
Patient presents today for MVASI and Folfiri only. Follow up appointment canceled by Dr. Ellin Saba. Labs pending. Patient has no complaints of any changes since his last treatment. Patient experienced diarrhea last treatment and was given new medication for that and his last BM was Saturday. Patient states, " the diarrhea has gotten much better." No complaints  noted today.

## 2023-11-26 NOTE — Progress Notes (Signed)
Treatment given per orders. Patient tolerated it well without problems. Vitals stable and discharged home from clinic ambulatory. Follow up as scheduled.  

## 2023-11-27 LAB — CEA: CEA: 99.7 ng/mL — ABNORMAL HIGH (ref 0.0–4.7)

## 2023-11-28 ENCOUNTER — Inpatient Hospital Stay: Payer: Medicare Other

## 2023-11-28 VITALS — BP 104/72 | HR 88 | Temp 96.6°F | Resp 20

## 2023-11-28 DIAGNOSIS — C180 Malignant neoplasm of cecum: Secondary | ICD-10-CM

## 2023-11-28 DIAGNOSIS — Z5111 Encounter for antineoplastic chemotherapy: Secondary | ICD-10-CM | POA: Diagnosis not present

## 2023-11-28 DIAGNOSIS — Z95828 Presence of other vascular implants and grafts: Secondary | ICD-10-CM

## 2023-11-28 DIAGNOSIS — C7801 Secondary malignant neoplasm of right lung: Secondary | ICD-10-CM | POA: Diagnosis not present

## 2023-11-28 DIAGNOSIS — Z5112 Encounter for antineoplastic immunotherapy: Secondary | ICD-10-CM | POA: Diagnosis not present

## 2023-11-28 DIAGNOSIS — C7972 Secondary malignant neoplasm of left adrenal gland: Secondary | ICD-10-CM | POA: Diagnosis not present

## 2023-11-28 DIAGNOSIS — Z5189 Encounter for other specified aftercare: Secondary | ICD-10-CM | POA: Diagnosis not present

## 2023-11-28 DIAGNOSIS — C7802 Secondary malignant neoplasm of left lung: Secondary | ICD-10-CM | POA: Diagnosis not present

## 2023-11-28 MED ORDER — HEPARIN SOD (PORK) LOCK FLUSH 100 UNIT/ML IV SOLN
500.0000 [IU] | Freq: Once | INTRAVENOUS | Status: AC | PRN
Start: 2023-11-28 — End: 2023-11-28
  Administered 2023-11-28: 500 [IU]

## 2023-11-28 MED ORDER — SODIUM CHLORIDE 0.9% FLUSH
10.0000 mL | INTRAVENOUS | Status: DC | PRN
  Administered 2023-11-28: 10 mL

## 2023-11-28 MED ORDER — PEGFILGRASTIM-CBQV 6 MG/0.6ML ~~LOC~~ SOSY
6.0000 mg | PREFILLED_SYRINGE | Freq: Once | SUBCUTANEOUS | Status: AC
  Administered 2023-11-28: 6 mg via SUBCUTANEOUS
  Filled 2023-11-28: qty 0.6

## 2023-11-28 NOTE — Progress Notes (Signed)
Patient tolerated injection with no complaints voiced. Site clean and dry with no bruising or swelling noted at site. See MAR for details. Band aid applied.  Patient stable during and after injection. Chemo pump disconnected, Port flushed with good blood return noted. No bruising or swelling at site. Bandaid applied and patient discharged in satisfactory condition. VVS stable with no signs or symptoms of distressed noted.

## 2023-11-28 NOTE — Patient Instructions (Signed)
[facility] [place] [facility]  Discharge Instructions: Thank you for choosing [facility] to provide your oncology and hematology care.  If you have a lab appointment with the Cancer Center - please note that after April 8th, 2024, all labs will be drawn in the cancer center.  You do not have to check in or register with the main entrance as you have in the past but will complete your check-in in the cancer center.  Wear comfortable clothing and clothing appropriate for easy access to any Portacath or PICC line.   We strive to give you quality time with your provider. You may need to reschedule your appointment if you arrive late (15 or more minutes).  Arriving late affects you and other patients whose appointments are after yours.  Also, if you miss three or more appointments without notifying the office, you may be dismissed from the clinic at the provider's discretion.      For prescription refill requests, have your pharmacy contact our office and allow 72 hours for refills to be completed.    Today you received the following chemo pump disconnected, udencya injection given.   To help prevent nausea and vomiting after your treatment, we encourage you to take your nausea medication as directed.  BELOW ARE SYMPTOMS THAT SHOULD BE REPORTED IMMEDIATELY: *FEVER GREATER THAN 100.4 F (38 C) OR HIGHER *CHILLS OR SWEATING *NAUSEA AND VOMITING THAT IS NOT CONTROLLED WITH YOUR NAUSEA MEDICATION *UNUSUAL SHORTNESS OF BREATH *UNUSUAL BRUISING OR BLEEDING *URINARY PROBLEMS (pain or burning when urinating, or frequent urination) *BOWEL PROBLEMS (unusual diarrhea, constipation, pain near the anus) TENDERNESS IN MOUTH AND THROAT WITH OR WITHOUT PRESENCE OF ULCERS (sore throat, sores in mouth, or a toothache) UNUSUAL RASH, SWELLING OR PAIN  UNUSUAL VAGINAL DISCHARGE OR ITCHING   Items with * indicate a potential emergency and should be followed up as  soon as possible or go to the Emergency Department if any problems should occur.  Please show the CHEMOTHERAPY ALERT CARD or IMMUNOTHERAPY ALERT CARD at check-in to the Emergency Department and triage nurse.  Should you have questions after your visit or need to cancel or reschedule your appointment, please contact [facility] [place] - A DEPT OF Eligha Bridegroom [facility] [phone]  and follow the prompts.  Office hours are 8:00 a.m. to 4:30 p.m. Monday - Friday. Please note that voicemails left after 4:00 p.m. may not be returned until the following business day.  We are closed weekends and major holidays. You have access to a nurse at all times for urgent questions. Please call the main number to the clinic [phone] and follow the prompts.  For any non-urgent questions, you may also contact your provider using MyChart. We now offer e-Visits for anyone 66 and older to request care online for non-urgent symptoms. For details visit [email or web address].   Also download the MyChart app! Go to the app store, search "MyChart", open the app, select [place], and log in with your MyChart username and password.

## 2023-12-10 ENCOUNTER — Inpatient Hospital Stay (HOSPITAL_BASED_OUTPATIENT_CLINIC_OR_DEPARTMENT_OTHER): Payer: Medicare Other | Admitting: Hematology

## 2023-12-10 ENCOUNTER — Telehealth: Payer: Self-pay

## 2023-12-10 ENCOUNTER — Inpatient Hospital Stay: Payer: Medicare Other

## 2023-12-10 VITALS — BP 126/84 | HR 66 | Temp 97.2°F | Resp 18

## 2023-12-10 VITALS — BP 136/87 | HR 78 | Temp 98.1°F | Resp 20

## 2023-12-10 VITALS — Wt 191.0 lb

## 2023-12-10 DIAGNOSIS — Z5111 Encounter for antineoplastic chemotherapy: Secondary | ICD-10-CM | POA: Diagnosis not present

## 2023-12-10 DIAGNOSIS — C180 Malignant neoplasm of cecum: Secondary | ICD-10-CM

## 2023-12-10 DIAGNOSIS — C7801 Secondary malignant neoplasm of right lung: Secondary | ICD-10-CM | POA: Diagnosis not present

## 2023-12-10 DIAGNOSIS — C7972 Secondary malignant neoplasm of left adrenal gland: Secondary | ICD-10-CM | POA: Diagnosis not present

## 2023-12-10 DIAGNOSIS — Z5112 Encounter for antineoplastic immunotherapy: Secondary | ICD-10-CM | POA: Diagnosis not present

## 2023-12-10 DIAGNOSIS — C7802 Secondary malignant neoplasm of left lung: Secondary | ICD-10-CM | POA: Diagnosis not present

## 2023-12-10 DIAGNOSIS — Z95828 Presence of other vascular implants and grafts: Secondary | ICD-10-CM

## 2023-12-10 DIAGNOSIS — C189 Malignant neoplasm of colon, unspecified: Secondary | ICD-10-CM

## 2023-12-10 DIAGNOSIS — Z5189 Encounter for other specified aftercare: Secondary | ICD-10-CM | POA: Diagnosis not present

## 2023-12-10 LAB — COMPREHENSIVE METABOLIC PANEL
ALT: 9 U/L (ref 0–44)
AST: 16 U/L (ref 15–41)
Albumin: 3.4 g/dL — ABNORMAL LOW (ref 3.5–5.0)
Alkaline Phosphatase: 78 U/L (ref 38–126)
Anion gap: 7 (ref 5–15)
BUN: 10 mg/dL (ref 8–23)
CO2: 22 mmol/L (ref 22–32)
Calcium: 8.8 mg/dL — ABNORMAL LOW (ref 8.9–10.3)
Chloride: 108 mmol/L (ref 98–111)
Creatinine, Ser: 0.91 mg/dL (ref 0.61–1.24)
GFR, Estimated: >60 mL/min (ref >60)
Glucose, Bld: 103 mg/dL — ABNORMAL HIGH (ref 70–99)
Potassium: 3.2 mmol/L — ABNORMAL LOW (ref 3.5–5.1)
Sodium: 137 mmol/L (ref 135–145)
Total Bilirubin: 0.3 mg/dL (ref <1.2)
Total Protein: 6.8 g/dL (ref 6.5–8.1)

## 2023-12-10 LAB — CBC WITH DIFFERENTIAL/PLATELET
Abs Immature Granulocytes: 0.1 10*3/uL — ABNORMAL HIGH (ref 0.00–0.07)
Basophils Absolute: 0.2 10*3/uL — ABNORMAL HIGH (ref 0.0–0.1)
Basophils Relative: 2 %
Eosinophils Absolute: 0.1 10*3/uL (ref 0.0–0.5)
Eosinophils Relative: 1 %
HCT: 32.9 % — ABNORMAL LOW (ref 39.0–52.0)
Hemoglobin: 10.7 g/dL — ABNORMAL LOW (ref 13.0–17.0)
Lymphocytes Relative: 31 %
Lymphs Abs: 3.8 10*3/uL (ref 0.7–4.0)
MCH: 32.3 pg (ref 26.0–34.0)
MCHC: 32.5 g/dL (ref 30.0–36.0)
MCV: 99.4 fL (ref 80.0–100.0)
Monocytes Absolute: 1.1 10*3/uL — ABNORMAL HIGH (ref 0.1–1.0)
Monocytes Relative: 9 %
Myelocytes: 1 %
Neutro Abs: 6.8 10*3/uL (ref 1.7–7.7)
Neutrophils Relative %: 56 %
Platelets: 192 10*3/uL (ref 150–400)
RBC: 3.31 MIL/uL — ABNORMAL LOW (ref 4.22–5.81)
RDW: 17.1 % — ABNORMAL HIGH (ref 11.5–15.5)
WBC: 12.2 10*3/uL — ABNORMAL HIGH (ref 4.0–10.5)
nRBC: 0.4 % — ABNORMAL HIGH (ref 0.0–0.2)

## 2023-12-10 LAB — URINALYSIS, DIPSTICK ONLY
Bilirubin Urine: NEGATIVE
Glucose, UA: NEGATIVE mg/dL
Hgb urine dipstick: NEGATIVE
Ketones, ur: NEGATIVE mg/dL
Leukocytes,Ua: NEGATIVE
Nitrite: NEGATIVE
Protein, ur: 30 mg/dL — AB
Specific Gravity, Urine: 1.017 (ref 1.005–1.030)
pH: 5 (ref 5.0–8.0)

## 2023-12-10 LAB — MAGNESIUM: Magnesium: 1.7 mg/dL (ref 1.7–2.4)

## 2023-12-10 MED ORDER — IRINOTECAN HCL CHEMO INJECTION 100 MG/5ML
144.0000 mg/m2 | Freq: Once | INTRAVENOUS | Status: AC
  Administered 2023-12-10: 300 mg via INTRAVENOUS
  Filled 2023-12-10: qty 15

## 2023-12-10 MED ORDER — NALOXONE HCL 4 MG/0.1ML NA LIQD: NASAL | 3 refills | Status: AC

## 2023-12-10 MED ORDER — PALONOSETRON HCL INJECTION 0.25 MG/5ML
0.2500 mg | Freq: Once | INTRAVENOUS | Status: AC
  Administered 2023-12-10: 0.25 mg via INTRAVENOUS
  Filled 2023-12-10: qty 5

## 2023-12-10 MED ORDER — OXYCODONE HCL 10 MG PO TABS: 10.0000 mg | ORAL_TABLET | ORAL | 0 refills | Status: DC | PRN

## 2023-12-10 MED ORDER — SODIUM CHLORIDE 0.9 % IV SOLN
1920.0000 mg/m2 | INTRAVENOUS | Status: DC
  Administered 2023-12-10: 3500 mg via INTRAVENOUS
  Filled 2023-12-10: qty 70

## 2023-12-10 MED ORDER — DEXAMETHASONE SODIUM PHOSPHATE 10 MG/ML IJ SOLN
10.0000 mg | Freq: Once | INTRAMUSCULAR | Status: AC
  Administered 2023-12-10: 10 mg via INTRAVENOUS
  Filled 2023-12-10: qty 1

## 2023-12-10 MED ORDER — SODIUM CHLORIDE 0.9% FLUSH
10.0000 mL | INTRAVENOUS | Status: DC | PRN
Start: 2023-12-10 — End: 2023-12-10
  Administered 2023-12-10: 10 mL via INTRAVENOUS

## 2023-12-10 MED ORDER — POTASSIUM CHLORIDE CRYS ER 20 MEQ PO TBCR
40.0000 meq | EXTENDED_RELEASE_TABLET | Freq: Once | ORAL | Status: AC
  Administered 2023-12-10: 40 meq via ORAL
  Filled 2023-12-10: qty 2

## 2023-12-10 MED ORDER — SODIUM CHLORIDE 0.9 % IV SOLN
5.0000 mg/kg | Freq: Once | INTRAVENOUS | Status: AC
  Administered 2023-12-10: 400 mg via INTRAVENOUS
  Filled 2023-12-10: qty 16

## 2023-12-10 MED ORDER — ATROPINE SULFATE 1 MG/ML IV SOLN
1.0000 mg | Freq: Once | INTRAVENOUS | Status: AC
Start: 2023-12-10 — End: 2023-12-10
  Administered 2023-12-10: 1 mg via INTRAVENOUS
  Filled 2023-12-10: qty 1

## 2023-12-10 MED ORDER — OXYCODONE HCL ER 40 MG PO T12A: 40.0000 mg | EXTENDED_RELEASE_TABLET | Freq: Two times a day (BID) | ORAL | 0 refills | Status: DC

## 2023-12-10 MED ORDER — FOSAPREPITANT DIMEGLUMINE INJECTION 150 MG
150.0000 mg | Freq: Once | INTRAVENOUS | Status: AC
  Administered 2023-12-10: 150 mg via INTRAVENOUS
  Filled 2023-12-10: qty 150

## 2023-12-10 MED ORDER — SODIUM CHLORIDE 0.9 % IV SOLN: Freq: Once | INTRAVENOUS | Status: AC

## 2023-12-10 MED ORDER — FLUOROURACIL CHEMO INJECTION 2.5 GM/50ML
320.0000 mg/m2 | Freq: Once | INTRAVENOUS | Status: AC
  Administered 2023-12-10: 600 mg via INTRAVENOUS
  Filled 2023-12-10: qty 12

## 2023-12-10 MED ORDER — SODIUM CHLORIDE 0.9 % IV SOLN
400.0000 mg/m2 | Freq: Once | INTRAVENOUS | Status: AC
  Administered 2023-12-10: 780 mg via INTRAVENOUS
  Filled 2023-12-10: qty 39

## 2023-12-10 NOTE — Telephone Encounter (Signed)
Notified Patient of prior authorization approval for Oxycontin 40 mg tablets. Medication is approved through 11/10/2024. Pharmacy notified. No other needs or concerns voiced at this time.

## 2023-12-10 NOTE — Progress Notes (Signed)
[facility] [address] [place] [address]    Clinic Day:  12/10/2023  Referring physician: [name], PA  Patient Care Team: [name], [name] as PCP - General (Physician Assistant) [name], MD as Medical Oncologist (Medical Oncology) Therese Sarah, RN as Oncology Nurse Navigator (Medical Oncology)   ASSESSMENT & PLAN:   Assessment: 1. Left lung mass with multiple lung nodules: - Presentation with dry cough for 6 months. - 30 pound weight loss in the last couple of years, weight stable over the last 6 months. - CT chest with contrast on 12/16/2021 showed bulky left hilar mass measuring 8.1 x 7.5 cm.  Numerous bilateral lung nodules of varying sizes.  Left adrenal nodule measuring 2.8 x 2.2 cm.  Mediastinal and bilateral hilar adenopathy with the largest pretracheal node measuring 4.1 x 3.1 cm. - MRI of the brain from 01/04/2022 which was negative for metastatic disease. - CTAP from 01/03/2022 which showed isolated left adrenal metastasis with no other evidence of metastatic disease in the abdomen or pelvis. - Pathology of left lung biopsy which shows adenocarcinoma with necrosis.  CK20 positive and CDX2 positive but negative for CK7 indicating colonic primary. 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  HER2 negative.  TMB low.  MSI-stable.  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  Other targetable mutations negative. - FOLFIRI started on 02/22/2022, bevacizumab added with cycle 4  - CT CAP (05/17/2022): Mediastinal and hilar lymph nodes have decreased in size.  Largest perihilar left lower lobe lung mass has decreased in size.  Right upper lobe mass slightly increased in size.  Other nodules are stable.  Left adrenal mass decreased to 1.3 cm from 2.8 cm. - CT scan showed mixed response as it was compared to CT from 12/16/2021.  He did not start chemotherapy until 02/22/2022. - Maintenance 5-FU and bevacizumab from September 2023 through 08/29/2023 with  progression - FOLFIRI and bevacizumab started on 09/12/2023   2. Social/family history: - Lives by himself.  He paints houses for living. - He quit smoking 12 years back and started back again 1 and half year ago and smoked half pack per day.  He quit again about 1 week ago. - Father had cancer, type unknown to the patient.  Brother died of brain tumor.  3.  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  Margins negative.  No lymphovascular or perineural invasion. - Received 3 cycles of XELOX followed by Xeloda for total of 6 months.  Oxaliplatin discontinued during cycle 4 secondary to transaminitis, elevated bilirubin and thrombocytopenia.  However he was also treated for hep C with Harvoni after that.    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 - Denies any abdominal cramping or diarrhea. - He has constipation and is using lactulose.  Lactulose occasionally gives him diarrhea.  Recommend starting Colace 2 tablets daily at bedtime. - Reviewed labs today: Normal LFTs.  CBC grossly normal.  CEA has improved to 99.7 from 159.  UA shows protein at 30. - Proceed with treatment today in 2 weeks.  RTC 4 weeks with repeat CT CAP with contrast.  2.  Lower rib/epigastric pain: - He reports pain is worse.  He is requiring to take oxycodone 2 tablets every 4 hours occasionally and is running out of pain medication a few days  prior to refill. - Recommend starting long-acting OxyContin 40 mg every 12 hours.  He will continue to use oxycodone 10 mg 1 tablet every 4 hours as needed. - We discussed possibility of overdose.  Will also give him Narcan.  3.  Difficulty falling asleep: - He will continue trazodone as needed.  4.  Hypertension: - Continue amlodipine 10 mg daily.  Blood pressure today is 136/87.       Orders Placed This Encounter   Procedures   CT CHEST ABDOMEN PELVIS W CONTRAST    Standing Status:   Future    Expected Date:   01/10/2024    Expiration Date:   12/09/2024    If indicated for the ordered procedure, I authorize the administration of contrast media per Radiology protocol:   Yes    Does the patient have a contrast media/X-ray dye allergy?:   No    Preferred imaging location?:   [facility]    Release to patient:   Immediate    If indicated for the ordered procedure, I authorize the administration of oral contrast media per Radiology protocol:   Yes   CEA    Standing Status:   Future    Expected Date:   12/24/2023    Expiration Date:   12/23/2024   Magnesium    Standing Status:   Future    Expected Date:   12/24/2023    Expiration Date:   12/23/2024   CBC with Differential    Standing Status:   Future    Expected Date:   12/24/2023    Expiration Date:   12/23/2024   Comprehensive metabolic panel    Standing Status:   Future    Expected Date:   12/24/2023    Expiration Date:   12/23/2024   Urinalysis, dipstick only    Standing Status:   Future    Expected Date:   12/24/2023    Expiration Date:   12/23/2024   CEA    Standing Status:   Future    Expected Date:   01/07/2024    Expiration Date:   01/06/2025   Magnesium    Standing Status:   Future    Expected Date:   01/07/2024    Expiration Date:   01/06/2025   CBC with Differential    Standing Status:   Future    Expected Date:   01/07/2024    Expiration Date:   01/06/2025   Comprehensive metabolic panel    Standing Status:   Future    Expected Date:   01/07/2024    Expiration Date:   01/06/2025   Urinalysis, dipstick only    Standing Status:   Future    Expected Date:   01/07/2024    Expiration Date:   01/06/2025   CEA    Standing Status:   Future    Expected Date:   01/21/2024    Expiration Date:   01/20/2025   Magnesium    Standing Status:   Future    Expected Date:   01/21/2024    Expiration Date:   01/20/2025   CBC with Differential     Standing Status:   Future    Expected Date:   01/21/2024    Expiration Date:   01/20/2025   Comprehensive metabolic panel    Standing Status:   Future    Expected Date:   01/21/2024    Expiration Date:   01/20/2025   Urinalysis, dipstick only    Standing Status:  Future    Expected Date:   01/21/2024    Expiration Date:   01/20/2025      I,Katie Daubenspeck,acting as a scribe for [name], MD.,have documented all relevant documentation on the behalf of [name], MD,as directed by  [name], MD while in the presence of [name], MD.   I, [name] MD, have reviewed the above documentation for accuracy and completeness, and I agree with the above.   [name], MD   12/30/202410:11 AM  CHIEF COMPLAINT:   Diagnosis: metastatic colon cancer to the lungs and left adrenal gland    Cancer Staging  Cecal cancer [facility]) Staging form: Colon and Rectum, AJCC 8th Edition - Clinical stage from 10/07/2019: Stage IIIB (cT3, cN1, cM0) - Signed by [name], MD on 10/07/2019 - Pathologic stage from 01/23/2022: Stage IVB (rpTX, pN0, pM1b) - Unsigned    Prior Therapy: FOLFIRI 02/22/22 - 08/08/22   Current Therapy:  maintenance 5-FU + bevacizumab    HISTORY OF PRESENT ILLNESS:   Oncology History  Cecal cancer (HCC)  10/07/2019 Initial Diagnosis   Cecal cancer (HCC)   10/07/2019 Cancer Staging   Staging form: Colon and Rectum, AJCC 8th Edition - Clinical stage from 10/07/2019: Stage IIIB (cT3, cN1, cM0) - Signed by [name], MD on 10/07/2019   02/22/2022 - 08/08/2022 Chemotherapy   Patient is on Treatment Plan : COLORECTAL FOLFIRI / BEVACIZUMAB Q14D     02/22/2022 -  Chemotherapy   Patient is on Treatment Plan : COLORECTAL FOLFIRI + Bevacizumab q14d        INTERVAL HISTORY:   Evan [name] is a 67 y.o. male presenting to clinic today for follow up of metastatic colon cancer to the lungs and left adrenal gland.  He was last seen by me on 10/09/23.  Today, he states that he is doing well overall. His appetite level is at 100%. His energy level is at 100%.  PAST MEDICAL HISTORY:   Past Medical History: Past Medical History:  Diagnosis Date   Arthritis    Colon cancer (HCC)    colon   Hepatitis C    Hypertension    Port-A-Cath in place 02/15/2022    Surgical History: Past Surgical History:  Procedure Laterality Date   BIOPSY  03/12/2023   Procedure: BIOPSY;  Surgeon: Lanelle Bal, DO;  [facility];  Service: Endoscopy;;   COLON SURGERY     ESOPHAGEAL BANDING N/A 03/12/2023   Procedure: ESOPHAGEAL BANDING;  Surgeon: Lanelle Bal, DO;  Location: [facility];  Service: Endoscopy;  Laterality: N/A;   ESOPHAGOGASTRODUODENOSCOPY (EGD) WITH PROPOFOL N/A 03/12/2023   Procedure: ESOPHAGOGASTRODUODENOSCOPY (EGD) WITH PROPOFOL;  Surgeon: Lanelle Bal, DO;  Location: [facility];  Service: Endoscopy;  Laterality: N/A;  11:30AM; ASA 3   PORTACATH PLACEMENT Right 02/08/2022   Procedure: INSERTION PORT-A-CATH- RIJ;  Surgeon: Lewie Chamber, DO;  Location: [facility];  Service: General;  Laterality: Right;   REPLACEMENT TOTAL KNEE Left    SHOULDER ARTHROSCOPY Bilateral    TOTAL HIP ARTHROPLASTY Right 11/09/2020   Procedure: TOTAL HIP ARTHROPLASTY ANTERIOR APPROACH;  Surgeon: Sheral Apley, MD;  Location: [facility];  Service: Orthopedics;  Laterality: Right;   WRIST SURGERY Left     Social History: Social History   Socioeconomic History   Marital status: Married    Spouse name: Not on file   Number of children: 7   Years of education: Not on file   Highest education level: Not  on file  Occupational History   Occupation: employed  Tobacco Use   Smoking status: Former    Current packs/day: 0.00    Average packs/day: 1.5 packs/day for 20.0 years (30.0 ttl pk-yrs)    Types: Cigarettes    Start date: 11/19/1985    Quit date: 11/19/2005    Years since quitting: 18.0    Smokeless tobacco: Never  Vaping Use   Vaping status: Never Used  Substance and Sexual Activity   Alcohol use: Not Currently   Drug use: Never   Sexual activity: Yes  Other Topics Concern   Not on file  Social History Narrative   ** Merged History Encounter **       Separated from wife 11/2021   Social Drivers of Health   Financial Resource Strain: Low Risk  (10/07/2019)   Overall Financial Resource Strain (CARDIA)    Difficulty of Paying Living Expenses: Not very hard  Food Insecurity: No Food Insecurity (10/07/2019)   Hunger Vital Sign    Worried About Running Out of Food in the Last Year: Never true    Ran Out of Food in the Last Year: Never true  Transportation Needs: No Transportation Needs (10/07/2019)   PRAPARE - Administrator, Civil Service (Medical): No    Lack of Transportation (Non-Medical): No  Physical Activity: Inactive (10/07/2019)   Exercise Vital Sign    Days of Exercise per Week: 0 days    Minutes of Exercise per Session: 0 min  Stress: No Stress Concern Present (10/07/2019)   [employer] of Occupational Health - Occupational Stress Questionnaire    Feeling of Stress : Not at all  Social Connections: Moderately Integrated (10/07/2019)   Social Connection and Isolation Panel [NHANES]    Frequency of Communication with Friends and Family: Once a week    Frequency of Social Gatherings with Friends and Family: Once a week    Attends Religious Services: More than 4 times per year    Active Member of [employer] or Organizations: Yes    Attends Engineer, structural: More than 4 times per year    Marital Status: Married  Catering manager Violence: Not At Risk (10/07/2019)   Humiliation, Afraid, Rape, and Kick questionnaire    Fear of Current or Ex-Partner: No    Emotionally Abused: No    Physically Abused: No    Sexually Abused: No    Family History: Family History  Problem Relation Age of Onset   Heart disease Mother    Dementia  Father    Heart disease Sister    Cancer Brother    Hypertension Brother    Hypertension Brother    Healthy Son    Healthy Son    Healthy Son    Healthy Son    Healthy Daughter    Healthy Daughter    Healthy Daughter     Current Medications:  Current Outpatient Medications:    aluminum-magnesium hydroxide-simethicone (MAALOX) 200-200-20 MG/5ML SUSP, Take 30 mLs by mouth 4 (four) times daily -  before meals and at bedtime., Disp: 1680 mL, Rfl: 2   amLODipine (NORVASC) 10 MG tablet, Take 1 tablet (10 mg total) by mouth daily., Disp: 90 tablet, Rfl: 3   Bevacizumab (AVASTIN IV), Inject into the vein every 14 (fourteen) days. *start date TBD, Disp: , Rfl:    ENULOSE 10 GM/15ML SOLN, Take 10 g by mouth daily as needed., Disp: , Rfl:    fluorouracil CALGB 09811 2,400 mg/m2 in sodium chloride  0.9 % 150 mL, Inject 2,400 mg/m2 into the vein over 48 hr., Disp: , Rfl:    FLUOROURACIL IV, Inject into the vein every 14 (fourteen) days., Disp: , Rfl:    Lactulose 20 GM/30ML SOLN, Take 15 mLs (10 g total) by mouth at bedtime. Take 15 ml at bedtime every night to assist with regular bowel movements.  Titrate down if having multiple bowel movements.  If a bowel movement has not occurred in 3 to 4 days or longer, then take 15 ml every 3 hours until a bowel movent has occurred., Disp: 450 mL, Rfl: 5   LEUCOVORIN CALCIUM IV, Inject into the vein every 14 (fourteen) days., Disp: , Rfl:    lidocaine-prilocaine (EMLA) cream, Apply 1 Application topically as needed (Apply to port site 30 mins- 1 hour prior to treatment/labs.)., Disp: 30 g, Rfl: 0   loperamide (IMODIUM) 2 MG capsule, Take 1 capsule (2 mg total) by mouth as needed for diarrhea or loose stools (Take 2 capsules after first loose stool and then 1 capsule after each loose stool, but do not exceed more than 8 capsules in a 24 hour period)., Disp: 30 capsule, Rfl: 0   megestrol (MEGACE) 400 MG/10ML suspension, Take 10 mLs (400 mg total) by mouth 2 (two)  times daily., Disp: 480 mL, Rfl: 3   Misc. Devices MISC, Please provide patient with 1:1 ratio of magic mouthwash and lidocaine to swish and swallow QID, Disp: 1 each, Rfl: 3   naloxone (NARCAN) nasal spray 4 mg/0.1 mL, 0.89ml nasal spray in one nostril. May repeat the dose in other nostril in 2-3 minutes if needed., Disp: 2 each, Rfl: 3   oxyCODONE (OXYCONTIN) 40 mg 12 hr tablet, Take 1 tablet (40 mg total) by mouth every 12 (twelve) hours., Disp: 60 tablet, Rfl: 0   pantoprazole (PROTONIX) 40 MG tablet, Take 1 tablet (40 mg total) by mouth daily., Disp: 30 tablet, Rfl: 11   potassium chloride SA (KLOR-CON M) 20 MEQ tablet, Take 1 tablet (20 mEq total) by mouth daily., Disp: 90 tablet, Rfl: 4   Oxycodone HCl 10 MG TABS, Take 1 tablet (10 mg total) by mouth every 4 (four) hours as needed., Disp: 180 tablet, Rfl: 0   Allergies: No Known Allergies  REVIEW OF SYSTEMS:   Review of Systems  Constitutional:  Negative for chills, fatigue and fever.  HENT:   Negative for lump/mass, mouth sores, nosebleeds, sore throat and trouble swallowing.   Eyes:  Negative for eye problems.  Respiratory:  Positive for shortness of breath. Negative for cough.   Cardiovascular:  Positive for chest pain. Negative for leg swelling and palpitations.  Gastrointestinal:  Positive for constipation, nausea and vomiting. Negative for abdominal pain and diarrhea.  Genitourinary:  Negative for bladder incontinence, difficulty urinating, dysuria, frequency, hematuria and nocturia.   Musculoskeletal:  Negative for arthralgias, back pain, flank pain, myalgias and neck pain.  Skin:  Negative for itching and rash.  Neurological:  Negative for dizziness, headaches and numbness.  Hematological:  Does not bruise/bleed easily.  Psychiatric/Behavioral:  Negative for depression, sleep disturbance and suicidal ideas. The patient is not nervous/anxious.   All other systems reviewed and are negative.    VITALS:   Weight 191 lb (86.6  kg).  Wt Readings from Last 3 Encounters:  12/10/23 191 lb (86.6 kg)  11/26/23 188 lb 8 oz (85.5 kg)  11/13/23 185 lb (83.9 kg)    Body mass index is 26.64 kg/m.  Performance status (ECOG): 1 -  Symptomatic but completely ambulatory  PHYSICAL EXAM:   Physical Exam Vitals and nursing note reviewed. Exam conducted with a chaperone present.  Constitutional:      Appearance: Normal appearance.  Cardiovascular:     Rate and Rhythm: Normal rate and regular rhythm.     Pulses: Normal pulses.     Heart sounds: Normal heart sounds.  Pulmonary:     Effort: Pulmonary effort is normal.     Breath sounds: Normal breath sounds.  Abdominal:     Palpations: Abdomen is soft. There is no hepatomegaly, splenomegaly or mass.     Tenderness: There is no abdominal tenderness.  Musculoskeletal:     Right lower leg: No edema.     Left lower leg: No edema.  Lymphadenopathy:     Cervical: No cervical adenopathy.     Right cervical: No superficial, deep or posterior cervical adenopathy.    Left cervical: No superficial, deep or posterior cervical adenopathy.     Upper Body:     Right upper body: No supraclavicular or axillary adenopathy.     Left upper body: No supraclavicular or axillary adenopathy.  Neurological:     General: No focal deficit present.     Mental Status: He is alert and oriented to person, place, and time.  Psychiatric:        Mood and Affect: Mood normal.        Behavior: Behavior normal.     LABS:   CBC     Component Value Date/Time   WBC 12.2 (H) 12/10/2023 0919   RBC 3.31 (L) 12/10/2023 0919   HGB 10.7 (L) 12/10/2023 0919   HCT 32.9 (L) 12/10/2023 0919   PLT 192 12/10/2023 0919   MCV 99.4 12/10/2023 0919   MCH 32.3 12/10/2023 0919   MCHC 32.5 12/10/2023 0919   RDW 17.1 (H) 12/10/2023 0919   LYMPHSABS PENDING 12/10/2023 0919   MONOABS PENDING 12/10/2023 0919   EOSABS PENDING 12/10/2023 0919   BASOSABS PENDING 12/10/2023 0919    CMP      Component Value  Date/Time   NA 137 12/10/2023 0919   K 3.2 (L) 12/10/2023 0919   CL 108 12/10/2023 0919   CO2 22 12/10/2023 0919   GLUCOSE 103 (H) 12/10/2023 0919   BUN 10 12/10/2023 0919   CREATININE 0.91 12/10/2023 0919   CALCIUM 8.8 (L) 12/10/2023 0919   PROT 6.8 12/10/2023 0919   ALBUMIN 3.4 (L) 12/10/2023 0919   AST 16 12/10/2023 0919   ALT 9 12/10/2023 0919   ALKPHOS 78 12/10/2023 0919   BILITOT 0.3 12/10/2023 0919   GFRNONAA >60 12/10/2023 0919     Lab Results  Component Value Date   CEA1 99.7 (H) 11/26/2023   /  CEA  Date Value Ref Range Status  11/26/2023 99.7 (H) 0.0 - 4.7 ng/mL Final    Comment:    (NOTE)                             Nonsmokers          <3.9                             Smokers             <5.6 Roche [employer] Electrochemiluminescence Immunoassay (ECLIA) Values obtained with different assay methods or kits cannot be used interchangeably.  Results cannot be interpreted as absolute evidence  of the presence or absence of malignant disease. Performed At: [facility] [address] [place], [place] [id number] Jolene Schimke MD NW:[id number]    No results found for: "PSA1" No results found for: "CAN199" No results found for: "CAN125"  No results found for: "TOTALPROTELP", "ALBUMINELP", "A1GS", "A2GS", "BETS", "BETA2SER", "GAMS", "MSPIKE", "SPEI" Lab Results  Component Value Date   TIBC 406 01/16/2023   TIBC 294 06/06/2022   TIBC 237 (L) 02/22/2022   FERRITIN 150 01/16/2023   FERRITIN 243 06/06/2022   FERRITIN 292 02/22/2022   IRONPCTSAT 18 01/16/2023   IRONPCTSAT 24 06/06/2022   IRONPCTSAT 11 (L) 02/22/2022   Lab Results  Component Value Date   LDH 258 (H) 12/26/2021     STUDIES:   No results found.

## 2023-12-10 NOTE — Progress Notes (Signed)
Patients port flushed without difficulty.  Good blood return noted with no bruising or swelling noted at site.  Patient remains accessed for treatment.  

## 2023-12-10 NOTE — Progress Notes (Signed)
Patient presents today for MVASI/Folfiri infusion. Patient is in satisfactory condition with no new complaints voiced.  Vital signs are stable.  Labs reviewed by Dr. Ellin Saba during the office visit and all labs are within treatment parameters. Patient will receive 40 mEq potassium chloride p.o x 1 dose per Dr.K's standing orders.  We will proceed with treatment per MD orders.   Treatment given today per MD orders. Tolerated infusion without adverse affects. Vital signs stable. No complaints at this time. Discharged from clinic ambulatory in stable condition. Alert and oriented x 3. F/U with [facility] as scheduled. 5FU ambulatory pump infusing with no alarms beeping.

## 2023-12-10 NOTE — Patient Instructions (Signed)
[facility] at [facility] Discharge Instructions   You were seen and examined today by Dr. Ellin Saba.  He reviewed the results of your lab work which are normal/stable.   We will proceed with your treatment today.   Start taking Colace (docusate sodium) daily to help prevent constipation.   Return as scheduled.    Thank you for choosing [facility] at [facility] to provide your oncology and hematology care.  To afford each patient quality time with our provider, please arrive at least 15 minutes before your scheduled appointment time.   If you have a lab appointment with the Cancer Center please come in thru the Main Entrance and check in at the main information desk.  You need to re-schedule your appointment should you arrive 10 or more minutes late.  We strive to give you quality time with our providers, and arriving late affects you and other patients whose appointments are after yours.  Also, if you no show three or more times for appointments you may be dismissed from the clinic at the providers discretion.     Again, thank you for choosing [facility].  Our hope is that these requests will decrease the amount of time that you wait before being seen by our physicians.       _____________________________________________________________  Should you have questions after your visit to [facility], please contact our office at [phone] and follow the prompts.  Our office hours are 8:00 a.m. and 4:30 p.m. Monday - Friday.  Please note that voicemails left after 4:00 p.m. may not be returned until the following business day.  We are closed weekends and major holidays.  You do have access to a nurse 24-7, just call the main number to the clinic [phone] and do not press any options, hold on the line and a nurse will answer the phone.    For prescription refill requests, have your pharmacy contact our office and  allow 72 hours.    Due to Covid, you will need to wear a mask upon entering the hospital. If you do not have a mask, a mask will be given to you at the Main Entrance upon arrival. For doctor visits, patients may have 1 support person age 93 or older with them. For treatment visits, patients can not have anyone with them due to social distancing guidelines and our immunocompromised population.

## 2023-12-10 NOTE — Progress Notes (Signed)
Maintain dose of 5FU continuous pump at 1920 mg/m2 =3500 mg and 5FU push at 320 mg/m2 = 600 mg - do not increase doses with weight gain and increased BSA.   Dr Carilyn Goodpasture, PharmD

## 2023-12-10 NOTE — Patient Instructions (Signed)
[facility] [place] - A DEPT OF [facility]  Discharge Instructions: Thank you for choosing [facility] to provide your oncology and hematology care.  If you have a lab appointment with the Cancer Center - please note that after April 8th, 2024, all labs will be drawn in the cancer center.  You do not have to check in or register with the main entrance as you have in the past but will complete your check-in in the cancer center.  Wear comfortable clothing and clothing appropriate for easy access to any Portacath or PICC line.   We strive to give you quality time with your provider. You may need to reschedule your appointment if you arrive late (15 or more minutes).  Arriving late affects you and other patients whose appointments are after yours.  Also, if you miss three or more appointments without notifying the office, you may be dismissed from the clinic at the provider's discretion.      For prescription refill requests, have your pharmacy contact our office and allow 72 hours for refills to be completed.    Today you received the following chemotherapy and/or immunotherapy agents MVASI/Folfiri   To help prevent nausea and vomiting after your treatment, we encourage you to take your nausea medication as directed.   BELOW ARE SYMPTOMS THAT SHOULD BE REPORTED IMMEDIATELY: *FEVER GREATER THAN 100.4 F (38 C) OR HIGHER *CHILLS OR SWEATING *NAUSEA AND VOMITING THAT IS NOT CONTROLLED WITH YOUR NAUSEA MEDICATION *UNUSUAL SHORTNESS OF BREATH *UNUSUAL BRUISING OR BLEEDING *URINARY PROBLEMS (pain or burning when urinating, or frequent urination) *BOWEL PROBLEMS (unusual diarrhea, constipation, pain near the anus) TENDERNESS IN MOUTH AND THROAT WITH OR WITHOUT PRESENCE OF ULCERS (sore throat, sores in mouth, or a toothache) UNUSUAL RASH, SWELLING OR PAIN  UNUSUAL VAGINAL DISCHARGE OR ITCHING   Items with * indicate a potential emergency and should be followed  up as soon as possible or go to the Emergency Department if any problems should occur.  Please show the CHEMOTHERAPY ALERT CARD or IMMUNOTHERAPY ALERT CARD at check-in to the Emergency Department and triage nurse.  Should you have questions after your visit or need to cancel or reschedule your appointment, please contact [facility] [place] - A DEPT OF Eligha Bridegroom [facility] [phone]  and follow the prompts.  Office hours are 8:00 a.m. to 4:30 p.m. Monday - Friday. Please note that voicemails left after 4:00 p.m. may not be returned until the following business day.  We are closed weekends and major holidays. You have access to a nurse at all times for urgent questions. Please call the main number to the clinic [phone] and follow the prompts.  For any non-urgent questions, you may also contact your provider using MyChart. We now offer e-Visits for anyone 8 and older to request care online for non-urgent symptoms. For details visit [email or web address].   Also download the MyChart app! Go to the app store, search "MyChart", open the app, select [place], and log in with your MyChart username and password.

## 2023-12-11 ENCOUNTER — Other Ambulatory Visit: Payer: Self-pay

## 2023-12-11 LAB — CEA: CEA: 127 ng/mL — ABNORMAL HIGH (ref 0.0–4.7)

## 2023-12-12 ENCOUNTER — Inpatient Hospital Stay: Payer: Medicare Other | Attending: Hematology

## 2023-12-12 VITALS — BP 104/73 | HR 70 | Temp 98.0°F | Resp 18

## 2023-12-12 DIAGNOSIS — Z5111 Encounter for antineoplastic chemotherapy: Secondary | ICD-10-CM | POA: Insufficient documentation

## 2023-12-12 DIAGNOSIS — Z5112 Encounter for antineoplastic immunotherapy: Secondary | ICD-10-CM | POA: Insufficient documentation

## 2023-12-12 DIAGNOSIS — Z95828 Presence of other vascular implants and grafts: Secondary | ICD-10-CM

## 2023-12-12 DIAGNOSIS — Z5189 Encounter for other specified aftercare: Secondary | ICD-10-CM | POA: Diagnosis not present

## 2023-12-12 DIAGNOSIS — C180 Malignant neoplasm of cecum: Secondary | ICD-10-CM | POA: Diagnosis present

## 2023-12-12 MED ORDER — SODIUM CHLORIDE 0.9% FLUSH
10.0000 mL | INTRAVENOUS | Status: DC | PRN
  Administered 2023-12-12: 10 mL

## 2023-12-12 MED ORDER — HEPARIN SOD (PORK) LOCK FLUSH 100 UNIT/ML IV SOLN
500.0000 [IU] | Freq: Once | INTRAVENOUS | Status: AC | PRN
  Administered 2023-12-12: 500 [IU]

## 2023-12-12 MED ORDER — PEGFILGRASTIM-CBQV 6 MG/0.6ML ~~LOC~~ SOSY
6.0000 mg | PREFILLED_SYRINGE | Freq: Once | SUBCUTANEOUS | Status: AC
  Administered 2023-12-12: 6 mg via SUBCUTANEOUS
  Filled 2023-12-12: qty 0.6

## 2023-12-12 NOTE — Progress Notes (Signed)
 Patient tolerated injection with no complaints voiced. Site clean and dry with no bruising or swelling noted at site. See MAR for details. Band aid applied.  Patient stable during and after injection.  Chemo pump disconnected. Port flushed with good blood return noted. No bruising or swelling at site. Bandaid applied and patient discharged in satisfactory condition. VVS stable with no signs or symptoms of distressed noted.

## 2023-12-12 NOTE — Patient Instructions (Signed)
 [facility] [place] - A DEPT OF [place]  Discharge Instructions: Thank you for choosing [facility] to provide your oncology and hematology care.  If you have a lab appointment with the Cancer Center - please note that after April 8th, 2024, all labs will be drawn in the cancer center.  You do not have to check in or register with the main entrance as you have in the past but will complete your check-in in the cancer center.  Wear comfortable clothing and clothing appropriate for easy access to any Portacath or PICC line.   We strive to give you quality time with your provider. You may need to reschedule your appointment if you arrive late (15 or more minutes).  Arriving late affects you and other patients whose appointments are after yours.  Also, if you miss three or more appointments without notifying the office, you may be dismissed from the clinic at the provider's discretion.      For prescription refill requests, have your pharmacy contact our office and allow 72 hours for refills to be completed.    Today you received the following chemo pump disconnected and injection given. Return as scheduled.   To help prevent nausea and vomiting after your treatment, we encourage you to take your nausea medication as directed.  BELOW ARE SYMPTOMS THAT SHOULD BE REPORTED IMMEDIATELY: *FEVER GREATER THAN 100.4 F (38 C) OR HIGHER *CHILLS OR SWEATING *NAUSEA AND VOMITING THAT IS NOT CONTROLLED WITH YOUR NAUSEA MEDICATION *UNUSUAL SHORTNESS OF BREATH *UNUSUAL BRUISING OR BLEEDING *URINARY PROBLEMS (pain or burning when urinating, or frequent urination) *BOWEL PROBLEMS (unusual diarrhea, constipation, pain near the anus) TENDERNESS IN MOUTH AND THROAT WITH OR WITHOUT PRESENCE OF ULCERS (sore throat, sores in mouth, or a toothache) UNUSUAL RASH, SWELLING OR PAIN  UNUSUAL VAGINAL DISCHARGE OR ITCHING   Items with * indicate a potential emergency and should be  followed up as soon as possible or go to the Emergency Department if any problems should occur.  Please show the CHEMOTHERAPY ALERT CARD or IMMUNOTHERAPY ALERT CARD at check-in to the Emergency Department and triage nurse.  Should you have questions after your visit or need to cancel or reschedule your appointment, please contact [facility]ER CTR [place] - A DEPT OF [name] [phone]  and follow the prompts.  Office hours are 8:00 a.m. to 4:30 p.m. Monday - Friday. Please note that voicemails left after 4:00 p.m. may not be returned until the following business day.  We are closed weekends and major holidays. You have access to a nurse at all times for urgent questions. Please call the main number to the clinic [phone] and follow the prompts.  For any non-urgent questions, you may also contact your provider using MyChart. We now offer e-Visits for anyone 80 and older to request care online for non-urgent symptoms. For details visit [email or web address].   Also download the MyChart app! Go to the app store, search MyChart, open the app, select [place], and log in with your MyChart username and password.

## 2023-12-25 ENCOUNTER — Encounter: Payer: Self-pay | Admitting: Hematology

## 2023-12-25 ENCOUNTER — Inpatient Hospital Stay: Payer: Medicare Other

## 2023-12-25 VITALS — BP 161/87 | HR 67 | Temp 97.6°F | Resp 18 | Wt 192.5 lb

## 2023-12-25 DIAGNOSIS — C180 Malignant neoplasm of cecum: Secondary | ICD-10-CM

## 2023-12-25 DIAGNOSIS — Z95828 Presence of other vascular implants and grafts: Secondary | ICD-10-CM

## 2023-12-25 DIAGNOSIS — R197 Diarrhea, unspecified: Secondary | ICD-10-CM

## 2023-12-25 DIAGNOSIS — I10 Essential (primary) hypertension: Secondary | ICD-10-CM

## 2023-12-25 LAB — CBC WITH DIFFERENTIAL/PLATELET
Abs Immature Granulocytes: 0.4 10*3/uL — ABNORMAL HIGH (ref 0.00–0.07)
Band Neutrophils: 3 %
Basophils Absolute: 0 10*3/uL (ref 0.0–0.1)
Basophils Relative: 0 %
Eosinophils Absolute: 0.1 10*3/uL (ref 0.0–0.5)
Eosinophils Relative: 1 %
HCT: 31.6 % — ABNORMAL LOW (ref 39.0–52.0)
Hemoglobin: 9.9 g/dL — ABNORMAL LOW (ref 13.0–17.0)
Lymphocytes Relative: 18 %
Lymphs Abs: 2.5 10*3/uL (ref 0.7–4.0)
MCH: 30.8 pg (ref 26.0–34.0)
MCHC: 31.3 g/dL (ref 30.0–36.0)
MCV: 98.4 fL (ref 80.0–100.0)
Metamyelocytes Relative: 1 %
Monocytes Absolute: 1 10*3/uL (ref 0.1–1.0)
Monocytes Relative: 7 %
Myelocytes: 2 %
Neutro Abs: 9.9 10*3/uL — ABNORMAL HIGH (ref 1.7–7.7)
Neutrophils Relative %: 68 %
Platelets: 156 10*3/uL (ref 150–400)
RBC: 3.21 MIL/uL — ABNORMAL LOW (ref 4.22–5.81)
RDW: 17.7 % — ABNORMAL HIGH (ref 11.5–15.5)
Smear Review: ADEQUATE
WBC: 13.9 10*3/uL — ABNORMAL HIGH (ref 4.0–10.5)
nRBC: 0.1 % (ref 0.0–0.2)

## 2023-12-25 LAB — COMPREHENSIVE METABOLIC PANEL
ALT: 9 U/L (ref 0–44)
AST: 19 U/L (ref 15–41)
Albumin: 3.3 g/dL — ABNORMAL LOW (ref 3.5–5.0)
Alkaline Phosphatase: 72 U/L (ref 38–126)
Anion gap: 10 (ref 5–15)
BUN: 7 mg/dL — ABNORMAL LOW (ref 8–23)
CO2: 22 mmol/L (ref 22–32)
Calcium: 8.5 mg/dL — ABNORMAL LOW (ref 8.9–10.3)
Chloride: 107 mmol/L (ref 98–111)
Creatinine, Ser: 0.91 mg/dL (ref 0.61–1.24)
GFR, Estimated: >60 mL/min (ref >60)
Glucose, Bld: 98 mg/dL (ref 70–99)
Potassium: 3.2 mmol/L — ABNORMAL LOW (ref 3.5–5.1)
Sodium: 139 mmol/L (ref 135–145)
Total Bilirubin: 0.5 mg/dL (ref 0.0–1.2)
Total Protein: 6.6 g/dL (ref 6.5–8.1)

## 2023-12-25 LAB — MAGNESIUM: Magnesium: 1.7 mg/dL (ref 1.7–2.4)

## 2023-12-25 MED ORDER — DEXAMETHASONE SODIUM PHOSPHATE 10 MG/ML IJ SOLN
10.0000 mg | Freq: Once | INTRAMUSCULAR | Status: AC
  Administered 2023-12-25: 10 mg via INTRAVENOUS
  Filled 2023-12-25: qty 1

## 2023-12-25 MED ORDER — SODIUM CHLORIDE 0.9 % IV SOLN
1920.0000 mg/m2 | INTRAVENOUS | Status: DC
  Administered 2023-12-25: 3500 mg via INTRAVENOUS
  Filled 2023-12-25: qty 70

## 2023-12-25 MED ORDER — SODIUM CHLORIDE 0.9 % IV SOLN: Freq: Once | INTRAVENOUS | Status: AC

## 2023-12-25 MED ORDER — AMLODIPINE BESYLATE 10 MG PO TABS: 10.0000 mg | ORAL_TABLET | Freq: Every day | ORAL | 3 refills | Status: DC

## 2023-12-25 MED ORDER — BEVACIZUMAB-AWWB CHEMO INJECTION 400 MG/16ML
5.0000 mg/kg | Freq: Once | INTRAVENOUS | Status: AC
  Administered 2023-12-25: 400 mg via INTRAVENOUS
  Filled 2023-12-25: qty 16

## 2023-12-25 MED ORDER — FLUOROURACIL CHEMO INJECTION 2.5 GM/50ML
320.0000 mg/m2 | Freq: Once | INTRAVENOUS | Status: AC
  Administered 2023-12-25: 600 mg via INTRAVENOUS
  Filled 2023-12-25: qty 12

## 2023-12-25 MED ORDER — PALONOSETRON HCL INJECTION 0.25 MG/5ML
0.2500 mg | Freq: Once | INTRAVENOUS | Status: AC
Start: 2023-12-25 — End: 2023-12-25
  Administered 2023-12-25: 0.25 mg via INTRAVENOUS
  Filled 2023-12-25: qty 5

## 2023-12-25 MED ORDER — SODIUM CHLORIDE 0.9 % IV SOLN
144.0000 mg/m2 | Freq: Once | INTRAVENOUS | Status: AC
  Administered 2023-12-25: 300 mg via INTRAVENOUS
  Filled 2023-12-25: qty 15

## 2023-12-25 MED ORDER — SODIUM CHLORIDE 0.9 % IV SOLN
150.0000 mg | Freq: Once | INTRAVENOUS | Status: AC
  Administered 2023-12-25: 150 mg via INTRAVENOUS
  Filled 2023-12-25: qty 150

## 2023-12-25 MED ORDER — LEUCOVORIN CALCIUM INJECTION 350 MG
400.0000 mg/m2 | Freq: Once | INTRAMUSCULAR | Status: AC
  Administered 2023-12-25: 780 mg via INTRAVENOUS
  Filled 2023-12-25: qty 39

## 2023-12-25 MED ORDER — LOPERAMIDE HCL 2 MG PO CAPS: 2.0000 mg | ORAL_CAPSULE | ORAL | 0 refills | Status: DC | PRN

## 2023-12-25 NOTE — Progress Notes (Signed)
 Maintain dose of 5FU continuous pump at 1920 mg/m2 =3500 mg and 5FU push at 320 mg/m2 = 600 mg - do not increase doses with weight gain and increased BSA.   Dr Carilyn Goodpasture, PharmD

## 2023-12-25 NOTE — Progress Notes (Signed)
 Labs reviewed today. Ok to treat per treatment parameters. No new issues reported by patient at this time.  Treatment given per orders. Patient tolerated it well without problems. Vitals stable and discharged home from clinic ambulatory. Follow up as scheduled.

## 2023-12-25 NOTE — Patient Instructions (Signed)
 [facility] [place] - A DEPT OF [place]  Discharge Instructions: Thank you for choosing [facility] to provide your oncology and hematology care.  If you have a lab appointment with the Cancer Center - please note that after April 8th, 2024, all labs will be drawn in the cancer center.  You do not have to check in or register with the main entrance as you have in the past but will complete your check-in in the cancer center.  Wear comfortable clothing and clothing appropriate for easy access to any Portacath or PICC line.   We strive to give you quality time with your provider. You may need to reschedule your appointment if you arrive late (15 or more minutes).  Arriving late affects you and other patients whose appointments are after yours.  Also, if you miss three or more appointments without notifying the office, you may be dismissed from the clinic at the provider's discretion.      For prescription refill requests, have your pharmacy contact our office and allow 72 hours for refills to be completed.    Today you received the following chemotherapy and/or immunotherapy agents Irinotecan , leucovorin , adrucil    To help prevent nausea and vomiting after your treatment, we encourage you to take your nausea medication as directed.  BELOW ARE SYMPTOMS THAT SHOULD BE REPORTED IMMEDIATELY: *FEVER GREATER THAN 100.4 F (38 C) OR HIGHER *CHILLS OR SWEATING *NAUSEA AND VOMITING THAT IS NOT CONTROLLED WITH YOUR NAUSEA MEDICATION *UNUSUAL SHORTNESS OF BREATH *UNUSUAL BRUISING OR BLEEDING *URINARY PROBLEMS (pain or burning when urinating, or frequent urination) *BOWEL PROBLEMS (unusual diarrhea, constipation, pain near the anus) TENDERNESS IN MOUTH AND THROAT WITH OR WITHOUT PRESENCE OF ULCERS (sore throat, sores in mouth, or a toothache) UNUSUAL RASH, SWELLING OR PAIN  UNUSUAL VAGINAL DISCHARGE OR ITCHING   Items with * indicate a potential emergency and  should be followed up as soon as possible or go to the Emergency Department if any problems should occur.  Please show the CHEMOTHERAPY ALERT CARD or IMMUNOTHERAPY ALERT CARD at check-in to the Emergency Department and triage nurse.  Should you have questions after your visit or need to cancel or reschedule your appointment, please contact [facility]CER CTR [place] - A DEPT OF [name] [phone]  and follow the prompts.  Office hours are 8:00 a.m. to 4:30 p.m. Monday - Friday. Please note that voicemails left after 4:00 p.m. may not be returned until the following business day.  We are closed weekends and major holidays. You have access to a nurse at all times for urgent questions. Please call the main number to the clinic [phone] and follow the prompts.  For any non-urgent questions, you may also contact your provider using MyChart. We now offer e-Visits for anyone 43 and older to request care online for non-urgent symptoms. For details visit [email or web address].   Also download the MyChart app! Go to the app store, search MyChart, open the app, select [place], and log in with your MyChart username and password.

## 2023-12-26 ENCOUNTER — Encounter (HOSPITAL_COMMUNITY): Payer: Self-pay | Admitting: Hematology

## 2023-12-26 ENCOUNTER — Encounter: Payer: Self-pay | Admitting: Hematology

## 2023-12-26 LAB — CEA: CEA: 107 ng/mL — ABNORMAL HIGH (ref 0.0–4.7)

## 2023-12-27 ENCOUNTER — Inpatient Hospital Stay: Payer: Medicare Other

## 2023-12-27 VITALS — BP 150/97 | HR 76 | Temp 98.0°F | Resp 20

## 2023-12-27 DIAGNOSIS — Z95828 Presence of other vascular implants and grafts: Secondary | ICD-10-CM

## 2023-12-27 DIAGNOSIS — C180 Malignant neoplasm of cecum: Secondary | ICD-10-CM

## 2023-12-27 MED ORDER — SODIUM CHLORIDE 0.9% FLUSH
10.0000 mL | INTRAVENOUS | Status: DC | PRN
  Administered 2023-12-27: 10 mL

## 2023-12-27 MED ORDER — PEGFILGRASTIM-CBQV 6 MG/0.6ML ~~LOC~~ SOSY
6.0000 mg | PREFILLED_SYRINGE | Freq: Once | SUBCUTANEOUS | Status: AC
Start: 2023-12-27 — End: 2023-12-27
  Administered 2023-12-27: 6 mg via SUBCUTANEOUS
  Filled 2023-12-27: qty 0.6

## 2023-12-27 MED ORDER — HEPARIN SOD (PORK) LOCK FLUSH 100 UNIT/ML IV SOLN
500.0000 [IU] | Freq: Once | INTRAVENOUS | Status: AC | PRN
  Administered 2023-12-27: 500 [IU]

## 2023-12-27 NOTE — Patient Instructions (Signed)
 [facility] [place] [facility]  Discharge Instructions: Thank you for choosing [facility] to provide your oncology and hematology care.  If you have a lab appointment with the Cancer Center - please note that after April 8th, 2024, all labs will be drawn in the cancer center.  You do not have to check in or register with the main entrance as you have in the past but will complete your check-in in the cancer center.  Wear comfortable clothing and clothing appropriate for easy access to any Portacath or PICC line.   We strive to give you quality time with your provider. You may need to reschedule your appointment if you arrive late (15 or more minutes).  Arriving late affects you and other patients whose appointments are after yours.  Also, if you miss three or more appointments without notifying the office, you may be dismissed from the clinic at the provider's discretion.      For prescription refill requests, have your pharmacy contact our office and allow 72 hours for refills to be completed.    Today you received the following chemotherapy and/or immunotherapy agents Udenyca.  Pegfilgrastim Injection What is this medication? PEGFILGRASTIM (PEG fil gra stim) lowers the risk of infection in people who are receiving chemotherapy. It works by Systems analyst make more white blood cells, which protects your body from infection. It may also be used to help people who have been exposed to high doses of radiation. This medicine may be used for other purposes; ask your health care provider or pharmacist if you have questions. COMMON BRAND NAME(S): Cherly Hensen, Neulasta, Nyvepria, Stimufend, UDENYCA, UDENYCA ONBODY, Ziextenzo What should I tell my care team before I take this medication? They need to know if you have any of these conditions: Kidney disease Latex allergy Ongoing radiation therapy Sickle cell disease Skin reactions to  acrylic adhesives (On-Body Injector only) An unusual or allergic reaction to pegfilgrastim, filgrastim, other medications, foods, dyes, or preservatives Pregnant or trying to get pregnant Breast-feeding How should I use this medication? This medication is for injection under the skin. If you get this medication at home, you will be taught how to prepare and give the pre-filled syringe or how to use the On-body Injector. Refer to the patient Instructions for Use for detailed instructions. Use exactly as directed. Tell your care team immediately if you suspect that the On-body Injector may not have performed as intended or if you suspect the use of the On-body Injector resulted in a missed or partial dose. It is important that you put your used needles and syringes in a special sharps container. Do not put them in a trash can. If you do not have a sharps container, call your pharmacist or care team to get one. Talk to your care team about the use of this medication in children. While this medication may be prescribed for selected conditions, precautions do apply. Overdosage: If you think you have taken too much of this medicine contact a poison control center or emergency room at once. NOTE: This medicine is only for you. Do not share this medicine with others. What if I miss a dose? It is important not to miss your dose. Call your care team if you miss your dose. If you miss a dose due to an On-body Injector failure or leakage, a new dose should be administered as soon as possible using a single prefilled syringe for  manual use. What may interact with this medication? Interactions have not been studied. This list may not describe all possible interactions. Give your health care provider a list of all the medicines, herbs, non-prescription drugs, or dietary supplements you use. Also tell them if you smoke, drink alcohol, or use illegal drugs. Some items may interact with your medicine. What should I  watch for while using this medication? Your condition will be monitored carefully while you are receiving this medication. You may need blood work done while you are taking this medication. Talk to your care team about your risk of cancer. You may be more at risk for certain types of cancer if you take this medication. If you are going to need a MRI, CT scan, or other procedure, tell your care team that you are using this medication (On-Body Injector only). What side effects may I notice from receiving this medication? Side effects that you should report to your care team as soon as possible: Allergic reactions--skin rash, itching, hives, swelling of the face, lips, tongue, or throat Capillary leak syndrome--stomach or muscle pain, unusual weakness or fatigue, feeling faint or lightheaded, decrease in the amount of urine, swelling of the ankles, hands, or feet, trouble breathing High white blood cell level--fever, fatigue, trouble breathing, night sweats, change in vision, weight loss Inflammation of the aorta--fever, fatigue, back, chest, or stomach pain, severe headache Kidney injury (glomerulonephritis)--decrease in the amount of urine, red or dark Chanley Mcenery urine, foamy or bubbly urine, swelling of the ankles, hands, or feet Shortness of breath or trouble breathing Spleen injury--pain in upper left stomach or shoulder Unusual bruising or bleeding Side effects that usually do not require medical attention (report to your care team if they continue or are bothersome): Bone pain Pain in the hands or feet This list may not describe all possible side effects. Call your doctor for medical advice about side effects. You may report side effects to FDA at 1-800-FDA-1088. Where should I keep my medication? Keep out of the reach of children. If you are using this medication at home, you will be instructed on how to store it. Throw away any unused medication after the expiration date on the label. NOTE:  This sheet is a summary. It may not cover all possible information. If you have questions about this medicine, talk to your doctor, pharmacist, or health care provider.  2024 Elsevier/Gold Standard (2021-10-28 00:00:00)        To help prevent nausea and vomiting after your treatment, we encourage you to take your nausea medication as directed.  BELOW ARE SYMPTOMS THAT SHOULD BE REPORTED IMMEDIATELY: *FEVER GREATER THAN 100.4 F (38 C) OR HIGHER *CHILLS OR SWEATING *NAUSEA AND VOMITING THAT IS NOT CONTROLLED WITH YOUR NAUSEA MEDICATION *UNUSUAL SHORTNESS OF BREATH *UNUSUAL BRUISING OR BLEEDING *URINARY PROBLEMS (pain or burning when urinating, or frequent urination) *BOWEL PROBLEMS (unusual diarrhea, constipation, pain near the anus) TENDERNESS IN MOUTH AND THROAT WITH OR WITHOUT PRESENCE OF ULCERS (sore throat, sores in mouth, or a toothache) UNUSUAL RASH, SWELLING OR PAIN  UNUSUAL VAGINAL DISCHARGE OR ITCHING   Items with * indicate a potential emergency and should be followed up as soon as possible or go to the Emergency Department if any problems should occur.  Please show the CHEMOTHERAPY ALERT CARD or IMMUNOTHERAPY ALERT CARD at check-in to the Emergency Department and triage nurse.  Should you have questions after your visit or need to cancel or reschedule your appointment, please contact [facility] CANCER CTR [place] -  A DEPT OF Eligha Bridegroom [facility] [phone]  and follow the prompts.  Office hours are 8:00 a.m. to 4:30 p.m. Monday - Friday. Please note that voicemails left after 4:00 p.m. may not be returned until the following business day.  We are closed weekends and major holidays. You have access to a nurse at all times for urgent questions. Please call the main number to the clinic [phone] and follow the prompts.  For any non-urgent questions, you may also contact your provider using MyChart. We now offer e-Visits for anyone 14 and older to request care online for  non-urgent symptoms. For details visit [email or web address].   Also download the MyChart app! Go to the app store, search "MyChart", open the app, select [place], and log in with your MyChart username and password.

## 2023-12-27 NOTE — Progress Notes (Signed)
Patients port flushed without difficulty.  Good blood return noted with no bruising or swelling noted at site.  Home infusion 5FU pump disconnected.  Band aid applied.  VSS with discharge and left in satisfactory condition with no s/s of distress noted.   °

## 2024-01-07 ENCOUNTER — Encounter (HOSPITAL_COMMUNITY): Payer: Self-pay | Admitting: Hematology

## 2024-01-07 ENCOUNTER — Encounter: Payer: Self-pay | Admitting: Hematology

## 2024-01-08 ENCOUNTER — Encounter: Payer: Self-pay | Admitting: Hematology

## 2024-01-08 ENCOUNTER — Encounter (HOSPITAL_COMMUNITY): Payer: Self-pay | Admitting: Hematology

## 2024-01-09 ENCOUNTER — Ambulatory Visit (HOSPITAL_COMMUNITY)
Admission: RE | Admit: 2024-01-09 | Discharge: 2024-01-09 | Disposition: A | Discharge status: Home / Self Care | Payer: Medicare Other | Source: Ambulatory Visit | Attending: Hematology | Admitting: Hematology

## 2024-01-09 ENCOUNTER — Inpatient Hospital Stay: Payer: Medicare Other | Admitting: Hematology

## 2024-01-09 ENCOUNTER — Ambulatory Visit: Payer: Medicare Other

## 2024-01-09 DIAGNOSIS — C189 Malignant neoplasm of colon, unspecified: Secondary | ICD-10-CM | POA: Diagnosis present

## 2024-01-09 MED ORDER — IOHEXOL 300 MG/ML  SOLN
30.0000 mL | Freq: Once | INTRAMUSCULAR | Status: AC | PRN
  Administered 2024-01-09: 30 mL via ORAL

## 2024-01-09 MED ORDER — IOHEXOL 300 MG/ML  SOLN
100.0000 mL | Freq: Once | INTRAMUSCULAR | Status: AC | PRN
  Administered 2024-01-09: 100 mL via INTRAVENOUS

## 2024-01-11 ENCOUNTER — Inpatient Hospital Stay: Payer: Medicare Other

## 2024-01-13 NOTE — Progress Notes (Signed)
[name] [address] [place] [address]    Clinic Day:  01/14/2024  Referring physician: [name], PA  Patient Care Team: [name], [name] as PCP - General (Physician Assistant) [name], MD as Medical Oncologist (Medical Oncology) Therese Sarah, RN as Oncology Nurse Navigator (Medical Oncology)   ASSESSMENT & PLAN:   Assessment: 1. Left lung mass with multiple lung nodules: - Presentation with dry cough for 6 months. - 30 pound weight loss in the last couple of years, weight stable over the last 6 months. - CT chest with contrast on 12/16/2021 showed bulky left hilar mass measuring 8.1 x 7.5 cm.  Numerous bilateral lung nodules of varying sizes.  Left adrenal nodule measuring 2.8 x 2.2 cm.  Mediastinal and bilateral hilar adenopathy with the largest pretracheal node measuring 4.1 x 3.1 cm. - MRI of the brain from 01/04/2022 which was negative for metastatic disease. - CTAP from 01/03/2022 which showed isolated left adrenal metastasis with no other evidence of metastatic disease in the abdomen or pelvis. - Pathology of left lung biopsy which shows adenocarcinoma with necrosis.  CK20 positive and CDX2 positive but negative for CK7 indicating colonic primary. 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  HER2 negative.  TMB low.  MSI-stable.  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  Other targetable mutations negative. - FOLFIRI started on 02/22/2022, bevacizumab added with cycle 4  - CT CAP (05/17/2022): Mediastinal and hilar lymph nodes have decreased in size.  Largest perihilar left lower lobe lung mass has decreased in size.  Right upper lobe mass slightly increased in size.  Other nodules are stable.  Left adrenal mass decreased to 1.3 cm from 2.8 cm. - CT scan showed mixed response as it was compared to CT from 12/16/2021.  He did not start chemotherapy until 02/22/2022. - Maintenance 5-FU and bevacizumab from September 2023 through 08/29/2023 with  progression - FOLFIRI and bevacizumab started on 09/12/2023   2. Social/family history: - Lives by himself.  He paints houses for living. - He quit smoking 12 years back and started back again 1 and half year ago and smoked half pack per day.  He quit again about 1 week ago. - Father had cancer, type unknown to the patient.  Brother died of brain tumor.  3.  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  Margins negative.  No lymphovascular or perineural invasion. - Received 3 cycles of XELOX followed by Xeloda for total of 6 months.  Oxaliplatin discontinued during cycle 4 secondary to transaminitis, elevated bilirubin and thrombocytopenia.  However he was also treated for hep C with Harvoni after that.    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 - Occasional blood-tinged mucus when he blows his nose.  No active bleeding. - Reviewed labs today: Normal LFTs and creatinine.  CBC grossly normal.  Last CEA is down to 107 from 127. - CT CAP (01/09/2024): Stable mediastinal adenopathy and lung nodules.  No new lesions.  Other benign findings were discussed. - Continue FOLFIRI and bevacizumab every 2 weeks.  RTC 6 weeks for follow-up.  2.  Lower rib/epigastric pain: - He is taking OxyContin 40 mg 1 tab daily mostly.  He reports that he gets sleepy and tired if he takes the second pill.  He is using oxycodone 10 mg  tablets up to 4 times a day which allows him to continue to work.  Pain is well-controlled.  3.  Difficulty falling asleep: - Continue trazodone as needed.  He has not required it recently.  4.  Hypertension: - He is taking amlodipine 10 mg daily.  He had bacon and sausage this morning.  Blood pressure remains high at 165/96.  He will receive clonidine 0.2 mg x 1 today. - Will start him on lisinopril 10 mg daily.  5.  Hypokalemia: -  Continue K-Dur 20 milligrams daily.  Potassium is 3.4.    Orders Placed This Encounter  Procedures   CEA    Standing Status:   Future    Expected Date:   02/10/2024    Expiration Date:   02/09/2025   Magnesium    Standing Status:   Future    Expected Date:   02/10/2024    Expiration Date:   02/09/2025   CBC with Differential    Standing Status:   Future    Expected Date:   02/10/2024    Expiration Date:   02/09/2025   Comprehensive metabolic panel    Standing Status:   Future    Expected Date:   02/10/2024    Expiration Date:   02/09/2025   Urinalysis, dipstick only    Standing Status:   Future    Expected Date:   02/10/2024    Expiration Date:   02/09/2025   CEA    Standing Status:   Future    Expected Date:   02/24/2024    Expiration Date:   02/23/2025   Magnesium    Standing Status:   Future    Expected Date:   02/24/2024    Expiration Date:   02/23/2025   CBC with Differential    Standing Status:   Future    Expected Date:   02/24/2024    Expiration Date:   02/23/2025   Comprehensive metabolic panel    Standing Status:   Future    Expected Date:   02/24/2024    Expiration Date:   02/23/2025   Urinalysis, dipstick only    Standing Status:   Future    Expected Date:   02/24/2024    Expiration Date:   02/23/2025   CEA    Standing Status:   Future    Expected Date:   03/09/2024    Expiration Date:   03/09/2025   Magnesium    Standing Status:   Future    Expected Date:   03/09/2024    Expiration Date:   03/09/2025   CBC with Differential    Standing Status:   Future    Expected Date:   03/09/2024    Expiration Date:   03/09/2025   Comprehensive metabolic panel    Standing Status:   Future    Expected Date:   03/09/2024    Expiration Date:   03/09/2025   Urinalysis, dipstick only    Standing Status:   Future    Expected Date:   03/09/2024    Expiration Date:   03/09/2025      I,Katie Daubenspeck,acting as a scribe for [name], MD.,have documented all relevant documentation on  the behalf of [name], MD,as directed by  [name], MD while in the presence of [name], MD.   I, [name] MD, have reviewed the above documentation for accuracy and completeness, and I agree with the above.   [name], MD   2/3/202512:50 PM  CHIEF COMPLAINT:   Diagnosis: metastatic colon cancer to the lungs and left adrenal gland    Cancer Staging  Cecal cancer [facility]) Staging form: Colon and Rectum, AJCC 8th Edition - Clinical stage from 10/07/2019: Stage IIIB (cT3, cN1, cM0) - Signed by [name], MD on 10/07/2019 - Pathologic stage from 01/23/2022: Stage IVB (rpTX, pN0, pM1b) - Unsigned    Prior Therapy: 1. FOLFIRI 02/22/22 - 08/08/22  2. Maintenance 5-FU and bevacizumab, 08/2022 - 08/29/23  Current Therapy:  FOLFIRI and bevacizumab   HISTORY OF PRESENT ILLNESS:   Oncology History  Cecal cancer (HCC)  10/07/2019 Initial Diagnosis   Cecal cancer (HCC)   10/07/2019 Cancer Staging   Staging form: Colon and Rectum, AJCC 8th Edition - Clinical stage from 10/07/2019: Stage IIIB (cT3, cN1, cM0) - Signed by [name], MD on 10/07/2019   02/22/2022 - 08/08/2022 Chemotherapy   Patient is on Treatment Plan : COLORECTAL FOLFIRI / BEVACIZUMAB Q14D     02/22/2022 -  Chemotherapy   Patient is on Treatment Plan : COLORECTAL FOLFIRI + Bevacizumab q14d        INTERVAL HISTORY:   Evan [name] is a 68 y.o. male presenting to clinic today for follow up of metastatic colon cancer to the lungs and left adrenal gland. He was last seen by me on 12/10/23.  Since his last visit, he underwent restaging CT C/A/P on 01/09/24.  Today, he states that he is doing well overall. His appetite level is at 100%. His energy level is at 100%.  PAST MEDICAL HISTORY:   Past Medical History: Past Medical History:  Diagnosis Date   Arthritis    Colon cancer (HCC)    colon   Hepatitis C    Hypertension    Port-A-Cath in place  02/15/2022    Surgical History: Past Surgical History:  Procedure Laterality Date   BIOPSY  03/12/2023   Procedure: BIOPSY;  Surgeon: Lanelle Bal, DO;  [facility]: [facility];  Service: Endoscopy;;   COLON SURGERY     ESOPHAGEAL BANDING N/A 03/12/2023   Procedure: ESOPHAGEAL BANDING;  Surgeon: Lanelle Bal, DO;  Location: [facility];  Service: Endoscopy;  Laterality: N/A;   ESOPHAGOGASTRODUODENOSCOPY (EGD) WITH PROPOFOL N/A 03/12/2023   Procedure: ESOPHAGOGASTRODUODENOSCOPY (EGD) WITH PROPOFOL;  Surgeon: Lanelle Bal, DO;  Location: [facility];  Service: Endoscopy;  Laterality: N/A;  11:30AM; ASA 3   PORTACATH PLACEMENT Right 02/08/2022   Procedure: INSERTION PORT-A-CATH- RIJ;  Surgeon: Lewie Chamber, DO;  Location: [facility];  Service: General;  Laterality: Right;   REPLACEMENT TOTAL KNEE Left    SHOULDER ARTHROSCOPY Bilateral    TOTAL HIP ARTHROPLASTY Right 11/09/2020   Procedure: TOTAL HIP ARTHROPLASTY ANTERIOR APPROACH;  Surgeon: Sheral Apley, MD;  Location: [facility];  Service: Orthopedics;  Laterality: Right;   WRIST SURGERY Left     Social History: Social History   Socioeconomic History   Marital status: Married    Spouse name: Not on file   Number of children: 7   Years of education: Not on file   Highest education level: Not on file  Occupational History   Occupation: employed  Tobacco Use   Smoking status: Former    Current packs/day: 0.00    Average packs/day: 1.5 packs/day for 20.0 years (30.0 ttl pk-yrs)    Types: Cigarettes    Start date: 11/19/1985    Quit date: 11/19/2005    Years since quitting: 18.1   Smokeless tobacco: Never  Vaping Use  Vaping status: Never Used  Substance and Sexual Activity   Alcohol use: Not Currently   Drug use: Never   Sexual activity: Yes  Other Topics Concern   Not on file  Social History Narrative   ** Merged History Encounter **       Separated from wife 11/2021   Social Drivers of Health    Financial Resource Strain: Low Risk  (10/07/2019)   Overall Financial Resource Strain (CARDIA)    Difficulty of Paying Living Expenses: Not very hard  Food Insecurity: No Food Insecurity (10/07/2019)   Hunger Vital Sign    Worried About Running Out of Food in the Last Year: Never true    Ran Out of Food in the Last Year: Never true  Transportation Needs: No Transportation Needs (10/07/2019)   PRAPARE - Administrator, Civil Service (Medical): No    Lack of Transportation (Non-Medical): No  Physical Activity: Inactive (10/07/2019)   Exercise Vital Sign    Days of Exercise per Week: 0 days    Minutes of Exercise per Session: 0 min  Stress: No Stress Concern Present (10/07/2019)   [employer] of Occupational Health - Occupational Stress Questionnaire    Feeling of Stress : Not at all  Social Connections: Moderately Integrated (10/07/2019)   Social Connection and Isolation Panel [NHANES]    Frequency of Communication with Friends and Family: Once a week    Frequency of Social Gatherings with Friends and Family: Once a week    Attends Religious Services: More than 4 times per year    Active Member of [employer] or Organizations: Yes    Attends Engineer, structural: More than 4 times per year    Marital Status: Married  Catering manager Violence: Not At Risk (10/07/2019)   Humiliation, Afraid, Rape, and Kick questionnaire    Fear of Current or Ex-Partner: No    Emotionally Abused: No    Physically Abused: No    Sexually Abused: No    Family History: Family History  Problem Relation Age of Onset   Heart disease Mother    Dementia Father    Heart disease Sister    Cancer Brother    Hypertension Brother    Hypertension Brother    Healthy Son    Healthy Son    Healthy Son    Healthy Son    Healthy Daughter    Healthy Daughter    Healthy Daughter     Current Medications:  Current Outpatient Medications:    aluminum-magnesium hydroxide-simethicone  (MAALOX) 200-200-20 MG/5ML SUSP, Take 30 mLs by mouth 4 (four) times daily -  before meals and at bedtime., Disp: 1680 mL, Rfl: 2   amLODipine (NORVASC) 10 MG tablet, Take 1 tablet (10 mg total) by mouth daily., Disp: 90 tablet, Rfl: 3   Bevacizumab (AVASTIN IV), Inject into the vein every 14 (fourteen) days. *start date TBD, Disp: , Rfl:    ENULOSE 10 GM/15ML SOLN, Take 10 g by mouth daily as needed., Disp: , Rfl:    fluorouracil CALGB 75643 2,400 mg/m2 in sodium chloride 0.9 % 150 mL, Inject 2,400 mg/m2 into the vein over 48 hr., Disp: , Rfl:    FLUOROURACIL IV, Inject into the vein every 14 (fourteen) days., Disp: , Rfl:    Lactulose 20 GM/30ML SOLN, Take 15 mLs (10 g total) by mouth at bedtime. Take 15 ml at bedtime every night to assist with regular bowel movements.  Titrate down if having multiple bowel movements.  If a bowel movement has not occurred in 3 to 4 days or longer, then take 15 ml every 3 hours until a bowel movent has occurred., Disp: 450 mL, Rfl: 5   LEUCOVORIN CALCIUM IV, Inject into the vein every 14 (fourteen) days., Disp: , Rfl:    lidocaine-prilocaine (EMLA) cream, Apply 1 Application topically as needed (Apply to port site 30 mins- 1 hour prior to treatment/labs.)., Disp: 30 g, Rfl: 0   lisinopril (ZESTRIL) 10 MG tablet, Take 1 tablet (10 mg total) by mouth daily., Disp: 30 tablet, Rfl: 3   loperamide (IMODIUM) 2 MG capsule, Take 1 capsule (2 mg total) by mouth as needed for diarrhea or loose stools (Take 2 capsules after first loose stool and then 1 capsule after each loose stool, but do not exceed more than 8 capsules in a 24 hour period)., Disp: 30 capsule, Rfl: 0   megestrol (MEGACE) 400 MG/10ML suspension, Take 10 mLs (400 mg total) by mouth 2 (two) times daily., Disp: 480 mL, Rfl: 3   Misc. Devices MISC, Please provide patient with 1:1 ratio of magic mouthwash and lidocaine to swish and swallow QID, Disp: 1 each, Rfl: 3   naloxone (NARCAN) nasal spray 4 mg/0.1 mL, 0.14ml  nasal spray in one nostril. May repeat the dose in other nostril in 2-3 minutes if needed. (Patient not taking: Reported on 01/14/2024), Disp: 2 each, Rfl: 3   oxyCODONE (OXYCONTIN) 40 mg 12 hr tablet, Take 1 tablet (40 mg total) by mouth every 12 (twelve) hours., Disp: 60 tablet, Rfl: 0   Oxycodone HCl 10 MG TABS, Take 1 tablet (10 mg total) by mouth every 4 (four) hours as needed., Disp: 180 tablet, Rfl: 0   pantoprazole (PROTONIX) 40 MG tablet, Take 1 tablet (40 mg total) by mouth daily., Disp: 30 tablet, Rfl: 11   potassium chloride SA (KLOR-CON M) 20 MEQ tablet, Take 1 tablet (20 mEq total) by mouth daily., Disp: 90 tablet, Rfl: 4 No current facility-administered medications for this visit.  Facility-Administered Medications Ordered in Other Visits:    0.9 %  sodium chloride infusion, , Intravenous, Once, [name], MD   bevacizumab-awwb (MVASI) 400 mg in sodium chloride 0.9 % 100 mL chemo infusion, 5 mg/kg (Order-Specific), Intravenous, Once, [name], MD   fluorouracil (ADRUCIL) 3,500 mg in sodium chloride 0.9 % 80 mL chemo infusion, 1,920 mg/m2 (Treatment Plan Recorded), Intravenous, 1 day or 1 dose, [name], MD   fluorouracil (ADRUCIL) chemo injection 600 mg, 320 mg/m2 (Treatment Plan Recorded), Intravenous, Once, [name], MD   heparin lock flush 100 unit/mL, 500 Units, Intracatheter, Once PRN, [name], MD   irinotecan (CAMPTOSAR) 300 mg in sodium chloride 0.9 % 500 mL chemo infusion, 144 mg/m2 (Treatment Plan Recorded), Intravenous, Once, [name], MD   leucovorin 780 mg in sodium chloride 0.9 % 250 mL infusion, 400 mg/m2 (Treatment Plan Recorded), Intravenous, Once, [name], MD   sodium chloride flush (NS) 0.9 % injection 10 mL, 10 mL, Intracatheter, PRN, [name], MD   Allergies: No Known Allergies  REVIEW OF SYSTEMS:   Review of Systems  Constitutional:  Negative for chills, fatigue  and fever.  HENT:   Negative for lump/mass, mouth sores, nosebleeds, sore throat and trouble swallowing.   Eyes:  Negative for eye problems.  Respiratory:  Negative for cough and shortness of breath.   Cardiovascular:  Negative for chest pain, leg swelling and palpitations.  Gastrointestinal:  Negative for abdominal pain, constipation, diarrhea, nausea and vomiting.  Genitourinary:  Negative for bladder incontinence, difficulty urinating, dysuria, frequency, hematuria and nocturia.   Musculoskeletal:  Negative for arthralgias, back pain, flank pain, myalgias and neck pain.  Skin:  Negative for itching and rash.  Neurological:  Negative for dizziness, headaches and numbness.  Hematological:  Does not bruise/bleed easily.  Psychiatric/Behavioral:  Negative for depression, sleep disturbance and suicidal ideas. The patient is not nervous/anxious.   All other systems reviewed and are negative.    VITALS:   Blood pressure (!) 165/96, pulse 85, temperature 98.2 F (36.8 C), temperature source Tympanic, resp. rate 20, weight 197 lb 5 oz (89.5 kg), SpO2 100%.  Wt Readings from Last 3 Encounters:  01/14/24 197 lb 5 oz (89.5 kg)  12/25/23 192 lb 8 oz (87.3 kg)  12/10/23 191 lb (86.6 kg)    Body mass index is 27.52 kg/m.  Performance status (ECOG): 1 - Symptomatic but completely ambulatory  PHYSICAL EXAM:   Physical Exam Vitals and nursing note reviewed. Exam conducted with a chaperone present.  Constitutional:      Appearance: Normal appearance.  Cardiovascular:     Rate and Rhythm: Normal rate and regular rhythm.     Pulses: Normal pulses.     Heart sounds: Normal heart sounds.  Pulmonary:     Effort: Pulmonary effort is normal.     Breath sounds: Normal breath sounds.  Abdominal:     Palpations: Abdomen is soft. There is no hepatomegaly, splenomegaly or mass.     Tenderness: There is no abdominal tenderness.  Musculoskeletal:     Right lower leg: No edema.     Left lower leg: No  edema.  Lymphadenopathy:     Cervical: No cervical adenopathy.     Right cervical: No superficial, deep or posterior cervical adenopathy.    Left cervical: No superficial, deep or posterior cervical adenopathy.     Upper Body:     Right upper body: No supraclavicular or axillary adenopathy.     Left upper body: No supraclavicular or axillary adenopathy.  Neurological:     General: No focal deficit present.     Mental Status: He is alert and oriented to person, place, and time.  Psychiatric:        Mood and Affect: Mood normal.        Behavior: Behavior normal.     LABS:   CBC     Component Value Date/Time   WBC 9.2 01/14/2024 0948   RBC 3.59 (L) 01/14/2024 0948   HGB 11.1 (L) 01/14/2024 0948   HCT 35.4 (L) 01/14/2024 0948   PLT 161 01/14/2024 0948   MCV 98.6 01/14/2024 0948   MCH 30.9 01/14/2024 0948   MCHC 31.4 01/14/2024 0948   RDW 18.0 (H) 01/14/2024 0948   LYMPHSABS 2.5 01/14/2024 0948   MONOABS 1.4 (H) 01/14/2024 0948   EOSABS 0.4 01/14/2024 0948   BASOSABS 0.1 01/14/2024 0948    CMP      Component Value Date/Time   NA 139 01/14/2024 0948   K 3.4 (L) 01/14/2024 0948   CL 107 01/14/2024 0948   CO2 21 (L) 01/14/2024 0948   GLUCOSE 107 (H) 01/14/2024 0948   BUN 11 01/14/2024 0948   CREATININE 0.86 01/14/2024 0948   CALCIUM 9.2 01/14/2024 0948   PROT 7.0 01/14/2024 0948   ALBUMIN 3.3 (L) 01/14/2024 0948   AST 18 01/14/2024 0948   ALT 9 01/14/2024 0948   ALKPHOS 63 01/14/2024 0948   BILITOT 0.6 01/14/2024 0948   GFRNONAA >  60 01/14/2024 0948     Lab Results  Component Value Date   CEA1 107.0 (H) 12/25/2023   /  CEA  Date Value Ref Range Status  12/25/2023 107.0 (H) 0.0 - 4.7 ng/mL Final    Comment:    (NOTE)                             Nonsmokers          <3.9                             Smokers             <5.6 [employer] Electrochemiluminescence Immunoassay (ECLIA) Values obtained with different assay methods or kits cannot be used  interchangeably.  Results cannot be interpreted as absolute evidence of the presence or absence of malignant disease. Performed At: [facility] [address] [place], [place] [id number] Jolene Schimke MD WU:[id number]    No results found for: "PSA1" No results found for: "[id number]" No results found for: "CAN125"  No results found for: "TOTALPROTELP", "ALBUMINELP", "A1GS", "A2GS", "BETS", "BETA2SER", "GAMS", "MSPIKE", "SPEI" Lab Results  Component Value Date   TIBC 406 01/16/2023   TIBC 294 06/06/2022   TIBC 237 (L) 02/22/2022   FERRITIN 150 01/16/2023   FERRITIN 243 06/06/2022   FERRITIN 292 02/22/2022   IRONPCTSAT 18 01/16/2023   IRONPCTSAT 24 06/06/2022   IRONPCTSAT 11 (L) 02/22/2022   Lab Results  Component Value Date   LDH 258 (H) 12/26/2021     STUDIES:   CT CHEST ABDOMEN PELVIS W CONTRAST Result Date: 01/14/2024 CLINICAL DATA:  Metastatic colon cancer restaging * Tracking Code: BO * EXAM: CT CHEST, ABDOMEN, AND PELVIS WITH CONTRAST TECHNIQUE: Multidetector CT imaging of the chest, abdomen and pelvis was performed following the standard protocol during bolus administration of intravenous contrast. RADIATION DOSE REDUCTION: This exam was performed according to the departmental dose-optimization program which includes automated exposure control, adjustment of the mA and/or kV according to patient size and/or use of iterative reconstruction technique. CONTRAST:  OMNIPAQUE IOHEXOL 300 MG/ML SOLN, 30mL OMNIPAQUE IOHEXOL 300 MG/ML SOLN COMPARISON:  09/05/2023 FINDINGS: CT CHEST FINDINGS Cardiovascular: Mild atheromatous vascular calcifications in the thoracic aorta and branch vessels. Mild uphill varices adjacent to the distal esophagus. Mediastinum/Nodes: Stable mediastinal adenopathy. Right hilar node 2.4 cm in short axis on image 22 series 2, formerly the same by my measurements. Right lower paratracheal node 1.8 cm in short axis on image 26 series 2, formerly the same  by my measurements. Lungs/Pleura: Stable trace left pleural effusion. Scattered bilateral pulmonary nodules with distribution similar to the prior exam, but mildly improved in size. Confluent nodule in the right upper lobe measures 3.2 by 2.3 cm on image 53 series 3, previously 3.5 by 2.7 cm, accordingly mildly improved. A pleural-based right lower lobe nodule medially measures 2.7 by 0.8 cm on image 107 series 3, previously 2.7 by 1.0 cm by my measurements. No new nodules are identified. There is occlusion of the superior segmental bronchus in the left lower lobe with a combination of mass and consolidation in the superior segment roughly similar to the prior exam. Musculoskeletal: There a screw and a prominent spur like extension of the right anterior glenoid. On the left side a similar screw extends into a large ossicle or unfused graft anterior to the left glenoid. Degenerative glenohumeral arthropathy bilaterally. Thoracic  spondylosis. CT ABDOMEN PELVIS FINDINGS Hepatobiliary: No liver mass observed. Prominent lateral segment left hepatic lobe, query early cirrhosis morphology. Gallbladder unremarkable. Pancreas: Unremarkable Spleen: The spleen measures 12.6 by 6.2 by 14.0 cm (volume = 570 cm^3), compatible with mild splenomegaly. No focal splenic lesion. Adrenals/Urinary Tract: Mild left adrenal nodularity measuring about 1.3 by 0.9 cm on image 58 series 2; larger left adrenal mass noted for example on 12/16/2021. Currently the appearance is stable from 09/05/2023. Right adrenal gland normal. The kidneys in urinary bladder appear normal. Stomach/Bowel: Mild right gastric varices. Prominent stool throughout the colon favors constipation. Vascular/Lymphatic: Mild aortoiliac atherosclerosis. Some atherosclerotic calcification proximally in both renal arteries. Reproductive: Unremarkable Other: No supplemental non-categorized findings. Musculoskeletal: Multilevel lumbar spondylosis and degenerative disc disease  causing mild impingement. IMPRESSION: 1. Stable mediastinal adenopathy. 2. Scattered bilateral pulmonary nodules with distribution similar to the prior exam, but mildly improved in size. Similar appearance of the consolidation and tumor in the superior segment left lower lobe. 3. Stable trace left pleural effusion. 4. Stable left adrenal nodularity. 5. Prominent stool throughout the colon favors constipation. 6. Early cirrhotic hepatic morphology with splenomegaly and uphill varices adjacent to the distal esophagus. 7. Multilevel lumbar spondylosis and degenerative disc disease causing mild impingement. 8. Aortic atherosclerosis. Aortic Atherosclerosis (ICD10-I70.0). Electronically Signed   By: Gaylyn Rong M.D.   On: 01/14/2024 08:36

## 2024-01-14 ENCOUNTER — Inpatient Hospital Stay: Payer: Medicare Other

## 2024-01-14 ENCOUNTER — Inpatient Hospital Stay: Payer: Medicare Other | Attending: Hematology | Admitting: Hematology

## 2024-01-14 VITALS — BP 111/77 | HR 57 | Temp 96.7°F | Resp 16

## 2024-01-14 VITALS — BP 165/96 | HR 85 | Temp 98.2°F | Resp 20 | Wt 197.3 lb

## 2024-01-14 DIAGNOSIS — Z5189 Encounter for other specified aftercare: Secondary | ICD-10-CM | POA: Insufficient documentation

## 2024-01-14 DIAGNOSIS — I10 Essential (primary) hypertension: Secondary | ICD-10-CM | POA: Insufficient documentation

## 2024-01-14 DIAGNOSIS — Z5111 Encounter for antineoplastic chemotherapy: Secondary | ICD-10-CM | POA: Diagnosis present

## 2024-01-14 DIAGNOSIS — C180 Malignant neoplasm of cecum: Secondary | ICD-10-CM | POA: Insufficient documentation

## 2024-01-14 DIAGNOSIS — Z95828 Presence of other vascular implants and grafts: Secondary | ICD-10-CM | POA: Diagnosis not present

## 2024-01-14 DIAGNOSIS — Z79899 Other long term (current) drug therapy: Secondary | ICD-10-CM | POA: Insufficient documentation

## 2024-01-14 DIAGNOSIS — Z87891 Personal history of nicotine dependence: Secondary | ICD-10-CM | POA: Insufficient documentation

## 2024-01-14 DIAGNOSIS — Z5112 Encounter for antineoplastic immunotherapy: Secondary | ICD-10-CM | POA: Insufficient documentation

## 2024-01-14 DIAGNOSIS — C7972 Secondary malignant neoplasm of left adrenal gland: Secondary | ICD-10-CM | POA: Insufficient documentation

## 2024-01-14 DIAGNOSIS — R1013 Epigastric pain: Secondary | ICD-10-CM | POA: Insufficient documentation

## 2024-01-14 DIAGNOSIS — E876 Hypokalemia: Secondary | ICD-10-CM | POA: Insufficient documentation

## 2024-01-14 DIAGNOSIS — C7802 Secondary malignant neoplasm of left lung: Secondary | ICD-10-CM | POA: Insufficient documentation

## 2024-01-14 DIAGNOSIS — C7801 Secondary malignant neoplasm of right lung: Secondary | ICD-10-CM | POA: Diagnosis not present

## 2024-01-14 LAB — URINALYSIS, DIPSTICK ONLY
Bilirubin Urine: NEGATIVE
Glucose, UA: NEGATIVE mg/dL
Ketones, ur: NEGATIVE mg/dL
Leukocytes,Ua: NEGATIVE
Nitrite: NEGATIVE
Protein, ur: 100 mg/dL — AB
Specific Gravity, Urine: 1.017 (ref 1.005–1.030)
pH: 5 (ref 5.0–8.0)

## 2024-01-14 LAB — COMPREHENSIVE METABOLIC PANEL
ALT: 9 U/L (ref 0–44)
AST: 18 U/L (ref 15–41)
Albumin: 3.3 g/dL — ABNORMAL LOW (ref 3.5–5.0)
Alkaline Phosphatase: 63 U/L (ref 38–126)
Anion gap: 11 (ref 5–15)
BUN: 11 mg/dL (ref 8–23)
CO2: 21 mmol/L — ABNORMAL LOW (ref 22–32)
Calcium: 9.2 mg/dL (ref 8.9–10.3)
Chloride: 107 mmol/L (ref 98–111)
Creatinine, Ser: 0.86 mg/dL (ref 0.61–1.24)
GFR, Estimated: >60 mL/min (ref >60)
Glucose, Bld: 107 mg/dL — ABNORMAL HIGH (ref 70–99)
Potassium: 3.4 mmol/L — ABNORMAL LOW (ref 3.5–5.1)
Sodium: 139 mmol/L (ref 135–145)
Total Bilirubin: 0.6 mg/dL (ref 0.0–1.2)
Total Protein: 7 g/dL (ref 6.5–8.1)

## 2024-01-14 LAB — CBC WITH DIFFERENTIAL/PLATELET
Abs Immature Granulocytes: 0.38 10*3/uL — ABNORMAL HIGH (ref 0.00–0.07)
Basophils Absolute: 0.1 10*3/uL (ref 0.0–0.1)
Basophils Relative: 1 %
Eosinophils Absolute: 0.4 10*3/uL (ref 0.0–0.5)
Eosinophils Relative: 4 %
HCT: 35.4 % — ABNORMAL LOW (ref 39.0–52.0)
Hemoglobin: 11.1 g/dL — ABNORMAL LOW (ref 13.0–17.0)
Immature Granulocytes: 4 %
Lymphocytes Relative: 28 %
Lymphs Abs: 2.5 10*3/uL (ref 0.7–4.0)
MCH: 30.9 pg (ref 26.0–34.0)
MCHC: 31.4 g/dL (ref 30.0–36.0)
MCV: 98.6 fL (ref 80.0–100.0)
Monocytes Absolute: 1.4 10*3/uL — ABNORMAL HIGH (ref 0.1–1.0)
Monocytes Relative: 15 %
Neutro Abs: 4.4 10*3/uL (ref 1.7–7.7)
Neutrophils Relative %: 48 %
Platelets: 161 10*3/uL (ref 150–400)
RBC: 3.59 MIL/uL — ABNORMAL LOW (ref 4.22–5.81)
RDW: 18 % — ABNORMAL HIGH (ref 11.5–15.5)
WBC: 9.2 10*3/uL (ref 4.0–10.5)
nRBC: 0 % (ref 0.0–0.2)

## 2024-01-14 LAB — MAGNESIUM: Magnesium: 1.8 mg/dL (ref 1.7–2.4)

## 2024-01-14 MED ORDER — CLONIDINE HCL 0.1 MG PO TABS
0.2000 mg | ORAL_TABLET | Freq: Once | ORAL | Status: AC
  Administered 2024-01-14: 0.2 mg via ORAL
  Filled 2024-01-14: qty 2

## 2024-01-14 MED ORDER — DEXAMETHASONE SODIUM PHOSPHATE 10 MG/ML IJ SOLN
10.0000 mg | Freq: Once | INTRAMUSCULAR | Status: AC
  Administered 2024-01-14: 10 mg via INTRAVENOUS
  Filled 2024-01-14: qty 1

## 2024-01-14 MED ORDER — LEUCOVORIN CALCIUM INJECTION 350 MG
400.0000 mg/m2 | Freq: Once | INTRAMUSCULAR | Status: AC
  Administered 2024-01-14: 780 mg via INTRAVENOUS
  Filled 2024-01-14: qty 39

## 2024-01-14 MED ORDER — SODIUM CHLORIDE 0.9 % IV SOLN
Freq: Once | INTRAVENOUS | Status: DC
Start: 2024-01-14 — End: 2024-01-14

## 2024-01-14 MED ORDER — FLUOROURACIL CHEMO INJECTION 2.5 GM/50ML
320.0000 mg/m2 | Freq: Once | INTRAVENOUS | Status: AC
  Administered 2024-01-14: 600 mg via INTRAVENOUS
  Filled 2024-01-14: qty 12

## 2024-01-14 MED ORDER — SODIUM CHLORIDE 0.9% FLUSH
10.0000 mL | Freq: Once | INTRAVENOUS | Status: AC
  Administered 2024-01-14: 10 mL via INTRAVENOUS

## 2024-01-14 MED ORDER — SODIUM CHLORIDE 0.9 % IV SOLN
144.0000 mg/m2 | Freq: Once | INTRAVENOUS | Status: AC
  Administered 2024-01-14: 300 mg via INTRAVENOUS
  Filled 2024-01-14: qty 15

## 2024-01-14 MED ORDER — LISINOPRIL 10 MG PO TABS: 10.0000 mg | ORAL_TABLET | Freq: Every day | ORAL | 3 refills | Status: DC

## 2024-01-14 MED ORDER — SODIUM CHLORIDE 0.9 % IV SOLN: Freq: Once | INTRAVENOUS | Status: AC

## 2024-01-14 MED ORDER — SODIUM CHLORIDE 0.9 % IV SOLN
5.0000 mg/kg | Freq: Once | INTRAVENOUS | Status: AC
  Administered 2024-01-14: 400 mg via INTRAVENOUS
  Filled 2024-01-14: qty 16

## 2024-01-14 MED ORDER — PALONOSETRON HCL INJECTION 0.25 MG/5ML
0.2500 mg | Freq: Once | INTRAVENOUS | Status: AC
  Administered 2024-01-14: 0.25 mg via INTRAVENOUS
  Filled 2024-01-14: qty 5

## 2024-01-14 MED ORDER — ATROPINE SULFATE 1 MG/ML IV SOLN
1.0000 mg | Freq: Once | INTRAVENOUS | Status: AC | PRN
Start: 2024-01-14 — End: 2024-01-14
  Administered 2024-01-14: 1 mg via INTRAVENOUS
  Filled 2024-01-14: qty 1

## 2024-01-14 MED ORDER — SODIUM CHLORIDE 0.9 % IV SOLN
1920.0000 mg/m2 | INTRAVENOUS | Status: DC
  Administered 2024-01-14: 3500 mg via INTRAVENOUS
  Filled 2024-01-14: qty 70

## 2024-01-14 MED ORDER — HEPARIN SOD (PORK) LOCK FLUSH 100 UNIT/ML IV SOLN: 500.0000 [IU] | Freq: Once | INTRAVENOUS | Status: DC | PRN

## 2024-01-14 MED ORDER — SODIUM CHLORIDE 0.9% FLUSH: 10.0000 mL | INTRAVENOUS | Status: DC | PRN

## 2024-01-14 MED ORDER — SODIUM CHLORIDE 0.9 % IV SOLN
150.0000 mg | Freq: Once | INTRAVENOUS | Status: AC
  Administered 2024-01-14: 150 mg via INTRAVENOUS
  Filled 2024-01-14: qty 150

## 2024-01-14 NOTE — Patient Instructions (Addendum)
[facility] at [facility] Discharge Instructions   You were seen and examined today by Dr. Ellin Saba.  He reviewed the results of your lab work which are normal/stable.   He reviewed the results of your CT scan which is stable. The cancer has not grown or spread.   We will proceed with your treatment today.   We will send in a new blood pressure medication called lisinopril to your pharmacy. Take this in addition to the amlodipine pill.   Return as scheduled.    Thank you for choosing [facility] at [facility] to provide your oncology and hematology care.  To afford each patient quality time with our provider, please arrive at least 15 minutes before your scheduled appointment time.   If you have a lab appointment with the Cancer Center please come in thru the Main Entrance and check in at the main information desk.  You need to re-schedule your appointment should you arrive 10 or more minutes late.  We strive to give you quality time with our providers, and arriving late affects you and other patients whose appointments are after yours.  Also, if you no show three or more times for appointments you may be dismissed from the clinic at the providers discretion.     Again, thank you for choosing [facility].  Our hope is that these requests will decrease the amount of time that you wait before being seen by our physicians.       _____________________________________________________________  Should you have questions after your visit to [facility], please contact our office at [phone] and follow the prompts.  Our office hours are 8:00 a.m. and 4:30 p.m. Monday - Friday.  Please note that voicemails left after 4:00 p.m. may not be returned until the following business day.  We are closed weekends and major holidays.  You do have access to a nurse 24-7, just call the main number to the clinic [phone] and do not  press any options, hold on the line and a nurse will answer the phone.    For prescription refill requests, have your pharmacy contact our office and allow 72 hours.    Due to Covid, you will need to wear a mask upon entering the hospital. If you do not have a mask, a mask will be given to you at the Main Entrance upon arrival. For doctor visits, patients may have 1 support person age 5 or older with them. For treatment visits, patients can not have anyone with them due to social distancing guidelines and our immunocompromised population.

## 2024-01-14 NOTE — Patient Instructions (Addendum)
[facility] [place] - A DEPT OF [facility]  Discharge Instructions: Thank you for choosing [facility] to provide your oncology and hematology care.  If you have a lab appointment with the Cancer Center - please note that after April 8th, 2024, all labs will be drawn in the cancer center.  You do not have to check in or register with the main entrance as you have in the past but will complete your check-in in the cancer center.  Wear comfortable clothing and clothing appropriate for easy access to any Portacath or PICC line.   We strive to give you quality time with your provider. You may need to reschedule your appointment if you arrive late (15 or more minutes).  Arriving late affects you and other patients whose appointments are after yours.  Also, if you miss three or more appointments without notifying the office, you may be dismissed from the clinic at the provider's discretion.      For prescription refill requests, have your pharmacy contact our office and allow 72 hours for refills to be completed.    Today you received the following chemotherapy and/or immunotherapy agents MVASI, Irinotecan, Leucovorin, and Adrucil.     Bevacizumab Injection What is this medication? BEVACIZUMAB (be va SIZ yoo mab) treats some types of cancer. It works by blocking a protein that causes cancer cells to grow and multiply. This helps to slow or stop the spread of cancer cells. It is a monoclonal antibody. This medicine may be used for other purposes; ask your health care provider or pharmacist if you have questions. COMMON BRAND NAME(S): Alymsys, Avastin, MVASI, Rosaland Lao What should I tell my care team before I take this medication? They need to know if you have any of these conditions: Blood clots Coughing up blood Having or recent surgery Heart failure High blood pressure History of a connection between 2 or more body parts that do not usually connect  (fistula) History of a tear in your stomach or intestines Protein in your urine An unusual or allergic reaction to bevacizumab, other medications, foods, dyes, or preservatives Pregnant or trying to get pregnant Breast-feeding How should I use this medication? This medication is injected into a vein. It is given by your care team in a hospital or clinic setting. Talk to your care team the use of this medication in children. Special care may be needed. Overdosage: If you think you have taken too much of this medicine contact a poison control center or emergency room at once. NOTE: This medicine is only for you. Do not share this medicine with others. What if I miss a dose? Keep appointments for follow-up doses. It is important not to miss your dose. Call your care team if you are unable to keep an appointment. What may interact with this medication? Interactions are not expected. This list may not describe all possible interactions. Give your health care provider a list of all the medicines, herbs, non-prescription drugs, or dietary supplements you use. Also tell them if you smoke, drink alcohol, or use illegal drugs. Some items may interact with your medicine. What should I watch for while using this medication? Your condition will be monitored carefully while you are receiving this medication. You may need blood work while taking this medication. This medication may make you feel generally unwell. This is not uncommon as chemotherapy can affect healthy cells as well as cancer cells. Report any side effects. Continue your  course of treatment even though you feel ill unless your care team tells you to stop. This medication may increase your risk to bruise or bleed. Call your care team if you notice any unusual bleeding. Before having surgery, talk to your care team to make sure it is ok. This medication can increase the risk of poor healing of your surgical site or wound. You will need to stop  this medication for 28 days before surgery. After surgery, wait at least 28 days before restarting this medication. Make sure the surgical site or wound is healed enough before restarting this medication. Talk to your care team if questions. Talk to your care team if you may be pregnant. Serious birth defects can occur if you take this medication during pregnancy and for 6 months after the last dose. Contraception is recommended while taking this medication and for 6 months after the last dose. Your care team can help you find the option that works for you. Do not breastfeed while taking this medication and for 6 months after the last dose. This medication can cause infertility. Talk to your care team if you are concerned about your fertility. What side effects may I notice from receiving this medication? Side effects that you should report to your care team as soon as possible: Allergic reactions--skin rash, itching, hives, swelling of the face, lips, tongue, or throat Bleeding--bloody or black, tar-like stools, vomiting blood or brown material that looks like coffee grounds, red or dark brown urine, small red or purple spots on skin, unusual bruising or bleeding Blood clot--pain, swelling, or warmth in the leg, shortness of breath, chest pain Heart attack--pain or tightness in the chest, shoulders, arms, or jaw, nausea, shortness of breath, cold or clammy skin, feeling faint or lightheaded Heart failure--shortness of breath, swelling of the ankles, feet, or hands, sudden weight gain, unusual weakness or fatigue Increase in blood pressure Infection--fever, chills, cough, sore throat, wounds that don't heal, pain or trouble when passing urine, general feeling of discomfort or being unwell Infusion reactions--chest pain, shortness of breath or trouble breathing, feeling faint or lightheaded Kidney injury--decrease in the amount of urine, swelling of the ankles, hands, or feet Stomach pain that is  severe, does not go away, or gets worse Stroke--sudden numbness or weakness of the face, arm, or leg, trouble speaking, confusion, trouble walking, loss of balance or coordination, dizziness, severe headache, change in vision Sudden and severe headache, confusion, change in vision, seizures, which may be signs of posterior reversible encephalopathy syndrome (PRES) Side effects that usually do not require medical attention (report to your care team if they continue or are bothersome): Back pain Change in taste Diarrhea Dry skin Increased tears Nosebleed This list may not describe all possible side effects. Call your doctor for medical advice about side effects. You may report side effects to FDA at 1-800-FDA-1088. Where should I keep my medication? This medication is given in a hospital or clinic. It will not be stored at home. NOTE: This sheet is a summary. It may not cover all possible information. If you have questions about this medicine, talk to your doctor, pharmacist, or health care provider.  2024 Elsevier/Gold Standard (2022-04-14 00:00:00)  Irinotecan Injection What is this medication? IRINOTECAN (ir in oh TEE kan) treats some types of cancer. It works by slowing down the growth of cancer cells. This medicine may be used for other purposes; ask your health care provider or pharmacist if you have questions. COMMON BRAND NAME(S): Camptosar What  should I tell my care team before I take this medication? They need to know if you have any of these conditions: Dehydration Diarrhea Infection, especially a viral infection, such as chickenpox, cold sores, herpes Liver disease Low blood cell levels (white cells, red cells, and platelets) Low levels of electrolytes, such as calcium, magnesium, or potassium in your blood Recent or ongoing radiation An unusual or allergic reaction to irinotecan, other medications, foods, dyes, or preservatives If you or your partner are pregnant or trying  to get pregnant Breast-feeding How should I use this medication? This medication is injected into a vein. It is given by your care team in a hospital or clinic setting. Talk to your care team about the use of this medication in children. Special care may be needed. Overdosage: If you think you have taken too much of this medicine contact a poison control center or emergency room at once. NOTE: This medicine is only for you. Do not share this medicine with others. What if I miss a dose? Keep appointments for follow-up doses. It is important not to miss your dose. Call your care team if you are unable to keep an appointment. What may interact with this medication? Do not take this medication with any of the following: Cobicistat Itraconazole This medication may also interact with the following: Certain antibiotics, such as clarithromycin, rifampin, rifabutin Certain antivirals for HIV or AIDS Certain medications for fungal infections, such as ketoconazole, posaconazole, voriconazole Certain medications for seizures, such as carbamazepine, phenobarbital, phenytoin Gemfibrozil Nefazodone St. John's wort This list may not describe all possible interactions. Give your health care provider a list of all the medicines, herbs, non-prescription drugs, or dietary supplements you use. Also tell them if you smoke, drink alcohol, or use illegal drugs. Some items may interact with your medicine. What should I watch for while using this medication? Your condition will be monitored carefully while you are receiving this medication. You may need blood work while taking this medication. This medication may make you feel generally unwell. This is not uncommon as chemotherapy can affect healthy cells as well as cancer cells. Report any side effects. Continue your course of treatment even though you feel ill unless your care team tells you to stop. This medication can cause serious side effects. To reduce the  risk, your care team may give you other medications to take before receiving this one. Be sure to follow the directions from your care team. This medication may affect your coordination, reaction time, or judgement. Do not drive or operate machinery until you know how this medication affects you. Sit up or stand slowly to reduce the risk of dizzy or fainting spells. Drinking alcohol with this medication can increase the risk of these side effects. This medication may increase your risk of getting an infection. Call your care team for advice if you get a fever, chills, sore throat, or other symptoms of a cold or flu. Do not treat yourself. Try to avoid being around people who are sick. Avoid taking medications that contain aspirin, acetaminophen, ibuprofen, naproxen, or ketoprofen unless instructed by your care team. These medications may hide a fever. This medication may increase your risk to bruise or bleed. Call your care team if you notice any unusual bleeding. Be careful brushing or flossing your teeth or using a toothpick because you may get an infection or bleed more easily. If you have any dental work done, tell your dentist you are receiving this medication. Talk to your  care team if you or your partner are pregnant or think either of you might be pregnant. This medication can cause serious birth defects if taken during pregnancy and for 6 months after the last dose. You will need a negative pregnancy test before starting this medication. Contraception is recommended while taking this medication and for 6 months after the last dose. Your care team can help you find the option that works for you. Do not father a child while taking this medication and for 3 months after the last dose. Use a condom for contraception during this time period. Do not breastfeed while taking this medication and for 7 days after the last dose. This medication may cause infertility. Talk to your care team if you are  concerned about your fertility. What side effects may I notice from receiving this medication? Side effects that you should report to your care team as soon as possible: Allergic reactions--skin rash, itching, hives, swelling of the face, lips, tongue, or throat Dry cough, shortness of breath or trouble breathing Increased saliva or tears, increased sweating, stomach cramping, diarrhea, small pupils, unusual weakness or fatigue, slow heartbeat Infection--fever, chills, cough, sore throat, wounds that don't heal, pain or trouble when passing urine, general feeling of discomfort or being unwell Kidney injury--decrease in the amount of urine, swelling of the ankles, hands, or feet Low red blood cell level--unusual weakness or fatigue, dizziness, headache, trouble breathing Severe or prolonged diarrhea Unusual bruising or bleeding Side effects that usually do not require medical attention (report to your care team if they continue or are bothersome): Constipation Diarrhea Hair loss Loss of appetite Nausea Stomach pain This list may not describe all possible side effects. Call your doctor for medical advice about side effects. You may report side effects to FDA at 1-800-FDA-1088. Where should I keep my medication? This medication is given in a hospital or clinic. It will not be stored at home. NOTE: This sheet is a summary. It may not cover all possible information. If you have questions about this medicine, talk to your doctor, pharmacist, or health care provider.  2024 Elsevier/Gold Standard (2022-04-10 00:00:00)  Leucovorin Injection What is this medication? LEUCOVORIN (loo koe VOR in) prevents side effects from certain medications, such as methotrexate. It works by increasing folate levels. This helps protect healthy cells in your body. It may also be used to treat anemia caused by low levels of folate. It can also be used with fluorouracil, a type of chemotherapy, to treat colorectal  cancer. It works by increasing the effects of fluorouracil in the body. This medicine may be used for other purposes; ask your health care provider or pharmacist if you have questions. What should I tell my care team before I take this medication? They need to know if you have any of these conditions: Anemia from low levels of vitamin B12 in the blood An unusual or allergic reaction to leucovorin, folic acid, other medications, foods, dyes, or preservatives Pregnant or trying to get pregnant Breastfeeding How should I use this medication? This medication is injected into a vein or a muscle. It is given by your care team in a hospital or clinic setting. Talk to your care team about the use of this medication in children. Special care may be needed. Overdosage: If you think you have taken too much of this medicine contact a poison control center or emergency room at once. NOTE: This medicine is only for you. Do not share this medicine with others. What  if I miss a dose? Keep appointments for follow-up doses. It is important not to miss your dose. Call your care team if you are unable to keep an appointment. What may interact with this medication? Capecitabine Fluorouracil Phenobarbital Phenytoin Primidone Trimethoprim;sulfamethoxazole This list may not describe all possible interactions. Give your health care provider a list of all the medicines, herbs, non-prescription drugs, or dietary supplements you use. Also tell them if you smoke, drink alcohol, or use illegal drugs. Some items may interact with your medicine. What should I watch for while using this medication? Your condition will be monitored carefully while you are receiving this medication. This medication may increase the side effects of 5-fluorouracil. Tell your care team if you have diarrhea or mouth sores that do not get better or that get worse. What side effects may I notice from receiving this medication? Side effects that  you should report to your care team as soon as possible: Allergic reactions--skin rash, itching, hives, swelling of the face, lips, tongue, or throat This list may not describe all possible side effects. Call your doctor for medical advice about side effects. You may report side effects to FDA at 1-800-FDA-1088. Where should I keep my medication? This medication is given in a hospital or clinic. It will not be stored at home. NOTE: This sheet is a summary. It may not cover all possible information. If you have questions about this medicine, talk to your doctor, pharmacist, or health care provider.  2024 Elsevier/Gold Standard (2022-05-02 00:00:00)  Fluorouracil Injection What is this medication? FLUOROURACIL (flure oh YOOR a sil) treats some types of cancer. It works by slowing down the growth of cancer cells. This medicine may be used for other purposes; ask your health care provider or pharmacist if you have questions. COMMON BRAND NAME(S): Adrucil What should I tell my care team before I take this medication? They need to know if you have any of these conditions: Blood disorders Dihydropyrimidine dehydrogenase (DPD) deficiency Infection, such as chickenpox, cold sores, herpes Kidney disease Liver disease Poor nutrition Recent or ongoing radiation therapy An unusual or allergic reaction to fluorouracil, other medications, foods, dyes, or preservatives If you or your partner are pregnant or trying to get pregnant Breast-feeding How should I use this medication? This medication is injected into a vein. It is administered by your care team in a hospital or clinic setting. Talk to your care team about the use of this medication in children. Special care may be needed. Overdosage: If you think you have taken too much of this medicine contact a poison control center or emergency room at once. NOTE: This medicine is only for you. Do not share this medicine with others. What if I miss a  dose? Keep appointments for follow-up doses. It is important not to miss your dose. Call your care team if you are unable to keep an appointment. What may interact with this medication? Do not take this medication with any of the following: Live virus vaccines This medication may also interact with the following: Medications that treat or prevent blood clots, such as warfarin, enoxaparin, dalteparin This list may not describe all possible interactions. Give your health care provider a list of all the medicines, herbs, non-prescription drugs, or dietary supplements you use. Also tell them if you smoke, drink alcohol, or use illegal drugs. Some items may interact with your medicine. What should I watch for while using this medication? Your condition will be monitored carefully while you are receiving  this medication. This medication may make you feel generally unwell. This is not uncommon as chemotherapy can affect healthy cells as well as cancer cells. Report any side effects. Continue your course of treatment even though you feel ill unless your care team tells you to stop. In some cases, you may be given additional medications to help with side effects. Follow all directions for their use. This medication may increase your risk of getting an infection. Call your care team for advice if you get a fever, chills, sore throat, or other symptoms of a cold or flu. Do not treat yourself. Try to avoid being around people who are sick. This medication may increase your risk to bruise or bleed. Call your care team if you notice any unusual bleeding. Be careful brushing or flossing your teeth or using a toothpick because you may get an infection or bleed more easily. If you have any dental work done, tell your dentist you are receiving this medication. Avoid taking medications that contain aspirin, acetaminophen, ibuprofen, naproxen, or ketoprofen unless instructed by your care team. These medications may hide  a fever. Do not treat diarrhea with over the counter products. Contact your care team if you have diarrhea that lasts more than 2 days or if it is severe and watery. This medication can make you more sensitive to the sun. Keep out of the sun. If you cannot avoid being in the sun, wear protective clothing and sunscreen. Do not use sun lamps, tanning beds, or tanning booths. Talk to your care team if you or your partner wish to become pregnant or think you might be pregnant. This medication can cause serious birth defects if taken during pregnancy and for 3 months after the last dose. A reliable form of contraception is recommended while taking this medication and for 3 months after the last dose. Talk to your care team about effective forms of contraception. Do not father a child while taking this medication and for 3 months after the last dose. Use a condom while having sex during this time period. Do not breastfeed while taking this medication. This medication may cause infertility. Talk to your care team if you are concerned about your fertility. What side effects may I notice from receiving this medication? Side effects that you should report to your care team as soon as possible: Allergic reactions--skin rash, itching, hives, swelling of the face, lips, tongue, or throat Heart attack--pain or tightness in the chest, shoulders, arms, or jaw, nausea, shortness of breath, cold or clammy skin, feeling faint or lightheaded Heart failure--shortness of breath, swelling of the ankles, feet, or hands, sudden weight gain, unusual weakness or fatigue Heart rhythm changes--fast or irregular heartbeat, dizziness, feeling faint or lightheaded, chest pain, trouble breathing High ammonia level--unusual weakness or fatigue, confusion, loss of appetite, nausea, vomiting, seizures Infection--fever, chills, cough, sore throat, wounds that don't heal, pain or trouble when passing urine, general feeling of discomfort or  being unwell Low red blood cell level--unusual weakness or fatigue, dizziness, headache, trouble breathing Pain, tingling, or numbness in the hands or feet, muscle weakness, change in vision, confusion or trouble speaking, loss of balance or coordination, trouble walking, seizures Redness, swelling, and blistering of the skin over hands and feet Severe or prolonged diarrhea Unusual bruising or bleeding Side effects that usually do not require medical attention (report to your care team if they continue or are bothersome): Dry skin Headache Increased tears Nausea Pain, redness, or swelling with sores inside the mouth  or throat Sensitivity to light Vomiting This list may not describe all possible side effects. Call your doctor for medical advice about side effects. You may report side effects to FDA at 1-800-FDA-1088. Where should I keep my medication? This medication is given in a hospital or clinic. It will not be stored at home. NOTE: This sheet is a summary. It may not cover all possible information. If you have questions about this medicine, talk to your doctor, pharmacist, or health care provider.  2024 Elsevier/Gold Standard (2022-04-04 00:00:00)   To help prevent nausea and vomiting after your treatment, we encourage you to take your nausea medication as directed.  BELOW ARE SYMPTOMS THAT SHOULD BE REPORTED IMMEDIATELY: *FEVER GREATER THAN 100.4 F (38 C) OR HIGHER *CHILLS OR SWEATING *NAUSEA AND VOMITING THAT IS NOT CONTROLLED WITH YOUR NAUSEA MEDICATION *UNUSUAL SHORTNESS OF BREATH *UNUSUAL BRUISING OR BLEEDING *URINARY PROBLEMS (pain or burning when urinating, or frequent urination) *BOWEL PROBLEMS (unusual diarrhea, constipation, pain near the anus) TENDERNESS IN MOUTH AND THROAT WITH OR WITHOUT PRESENCE OF ULCERS (sore throat, sores in mouth, or a toothache) UNUSUAL RASH, SWELLING OR PAIN  UNUSUAL VAGINAL DISCHARGE OR ITCHING   Items with * indicate a potential emergency  and should be followed up as soon as possible or go to the Emergency Department if any problems should occur.  Please show the CHEMOTHERAPY ALERT CARD or IMMUNOTHERAPY ALERT CARD at check-in to the Emergency Department and triage nurse.  Should you have questions after your visit or need to cancel or reschedule your appointment, please contact [facility] [place] - A DEPT OF [facility] [phone]  and follow the prompts.  Office hours are 8:00 a.m. to 4:30 p.m. Monday - Friday. Please note that voicemails left after 4:00 p.m. may not be returned until the following business day.  We are closed weekends and major holidays. You have access to a nurse at all times for urgent questions. Please call the main number to the clinic [phone] and follow the prompts.  For any non-urgent questions, you may also contact your provider using MyChart. We now offer e-Visits for anyone 51 and older to request care online for non-urgent symptoms. For details visit [email or web address].   Also download the MyChart app! Go to the app store, search "MyChart", open the app, select [place], and log in with your MyChart username and password.

## 2024-01-14 NOTE — Progress Notes (Signed)
Patient has been examined by Dr. Ellin Saba. Vital signs and labs have been reviewed by MD - ANC, Creatinine, LFTs, hemoglobin, and platelets are within treatment parameters per M.D. - pt may proceed with treatment.  Give clonidine 0.2 mg po x 1 per MD. Primary RN and pharmacy notified.

## 2024-01-14 NOTE — Progress Notes (Signed)
Patient is here today for treatment per MD orders. He reports that he has been doing well. His diarrhea is better and he has not had any issues with it.  His blood pressure is elevated today and Dr. Ellin Saba gave orders for Clonidine 0.2mg  times once today. Urine protein is 100. Dr. Ellin Saba is aware and okay to proceed with treatment. Patient report given to C. Simeon Craft, Charity fundraiser.

## 2024-01-14 NOTE — Progress Notes (Signed)
Maintain Mvasi at 400 mg - weight change right at 10%  Maintain Fluorouracil IVP dose at 600 mg and Pump at 3500 mg - despite BSA change.   T.O. Dr Carilyn Goodpasture, PharmD

## 2024-01-14 NOTE — Progress Notes (Signed)
 Patient tolerated treatment well with no complaints voiced.  Patient left ambulatory in stable condition.  Vital signs stable at discharge.  Follow up as scheduled.

## 2024-01-15 ENCOUNTER — Other Ambulatory Visit: Payer: Self-pay

## 2024-01-15 LAB — CEA: CEA: 173 ng/mL — ABNORMAL HIGH (ref 0.0–4.7)

## 2024-01-16 ENCOUNTER — Inpatient Hospital Stay: Payer: Medicare Other

## 2024-01-16 VITALS — BP 135/90 | HR 60 | Temp 98.2°F | Resp 17

## 2024-01-16 DIAGNOSIS — C180 Malignant neoplasm of cecum: Secondary | ICD-10-CM | POA: Diagnosis not present

## 2024-01-16 DIAGNOSIS — Z95828 Presence of other vascular implants and grafts: Secondary | ICD-10-CM

## 2024-01-16 MED ORDER — SODIUM CHLORIDE 0.9% FLUSH
10.0000 mL | INTRAVENOUS | Status: DC | PRN
  Administered 2024-01-16: 10 mL

## 2024-01-16 MED ORDER — PEGFILGRASTIM-CBQV 6 MG/0.6ML ~~LOC~~ SOSY
6.0000 mg | PREFILLED_SYRINGE | Freq: Once | SUBCUTANEOUS | Status: AC
  Administered 2024-01-16: 6 mg via SUBCUTANEOUS
  Filled 2024-01-16: qty 0.6

## 2024-01-16 MED ORDER — HEPARIN SOD (PORK) LOCK FLUSH 100 UNIT/ML IV SOLN
500.0000 [IU] | Freq: Once | INTRAVENOUS | Status: AC | PRN
  Administered 2024-01-16: 500 [IU]

## 2024-01-16 NOTE — Patient Instructions (Signed)
 [facility] [place] [facility]  Discharge Instructions: Thank you for choosing [facility] to provide your oncology and hematology care.  If you have a lab appointment with the Cancer Center - please note that after April 8th, 2024, all labs will be drawn in the cancer center.  You do not have to check in or register with the main entrance as you have in the past but will complete your check-in in the cancer center.  Wear comfortable clothing and clothing appropriate for easy access to any Portacath or PICC line.   We strive to give you quality time with your provider. You may need to reschedule your appointment if you arrive late (15 or more minutes).  Arriving late affects you and other patients whose appointments are after yours.  Also, if you miss three or more appointments without notifying the office, you may be dismissed from the clinic at the provider's discretion.      For prescription refill requests, have your pharmacy contact our office and allow 72 hours for refills to be completed.    Today you received the following chemotherapy and/or immunotherapy agents Adrucil. Fluorouracil Injection What is this medication? FLUOROURACIL (flure oh YOOR a sil) treats some types of cancer. It works by slowing down the growth of cancer cells. This medicine may be used for other purposes; ask your health care provider or pharmacist if you have questions. COMMON BRAND NAME(S): Adrucil What should I tell my care team before I take this medication? They need to know if you have any of these conditions: Blood disorders Dihydropyrimidine dehydrogenase (DPD) deficiency Infection, such as chickenpox, cold sores, herpes Kidney disease Liver disease Poor nutrition Recent or ongoing radiation therapy An unusual or allergic reaction to fluorouracil, other medications, foods, dyes, or preservatives If you or your partner are pregnant or trying to get  pregnant Breast-feeding How should I use this medication? This medication is injected into a vein. It is administered by your care team in a hospital or clinic setting. Talk to your care team about the use of this medication in children. Special care may be needed. Overdosage: If you think you have taken too much of this medicine contact a poison control center or emergency room at once. NOTE: This medicine is only for you. Do not share this medicine with others. What if I miss a dose? Keep appointments for follow-up doses. It is important not to miss your dose. Call your care team if you are unable to keep an appointment. What may interact with this medication? Do not take this medication with any of the following: Live virus vaccines This medication may also interact with the following: Medications that treat or prevent blood clots, such as warfarin, enoxaparin, dalteparin This list may not describe all possible interactions. Give your health care provider a list of all the medicines, herbs, non-prescription drugs, or dietary supplements you use. Also tell them if you smoke, drink alcohol, or use illegal drugs. Some items may interact with your medicine. What should I watch for while using this medication? Your condition will be monitored carefully while you are receiving this medication. This medication may make you feel generally unwell. This is not uncommon as chemotherapy can affect healthy cells as well as cancer cells. Report any side effects. Continue your course of treatment even though you feel ill unless your care team tells you to stop. In some cases, you may be given additional  medications to help with side effects. Follow all directions for their use. This medication may increase your risk of getting an infection. Call your care team for advice if you get a fever, chills, sore throat, or other symptoms of a cold or flu. Do not treat yourself. Try to avoid being around people who are  sick. This medication may increase your risk to bruise or bleed. Call your care team if you notice any unusual bleeding. Be careful brushing or flossing your teeth or using a toothpick because you may get an infection or bleed more easily. If you have any dental work done, tell your dentist you are receiving this medication. Avoid taking medications that contain aspirin, acetaminophen, ibuprofen, naproxen, or ketoprofen unless instructed by your care team. These medications may hide a fever. Do not treat diarrhea with over the counter products. Contact your care team if you have diarrhea that lasts more than 2 days or if it is severe and watery. This medication can make you more sensitive to the sun. Keep out of the sun. If you cannot avoid being in the sun, wear protective clothing and sunscreen. Do not use sun lamps, tanning beds, or tanning booths. Talk to your care team if you or your partner wish to become pregnant or think you might be pregnant. This medication can cause serious birth defects if taken during pregnancy and for 3 months after the last dose. A reliable form of contraception is recommended while taking this medication and for 3 months after the last dose. Talk to your care team about effective forms of contraception. Do not father a child while taking this medication and for 3 months after the last dose. Use a condom while having sex during this time period. Do not breastfeed while taking this medication. This medication may cause infertility. Talk to your care team if you are concerned about your fertility. What side effects may I notice from receiving this medication? Side effects that you should report to your care team as soon as possible: Allergic reactions--skin rash, itching, hives, swelling of the face, lips, tongue, or throat Heart attack--pain or tightness in the chest, shoulders, arms, or jaw, nausea, shortness of breath, cold or clammy skin, feeling faint or  lightheaded Heart failure--shortness of breath, swelling of the ankles, feet, or hands, sudden weight gain, unusual weakness or fatigue Heart rhythm changes--fast or irregular heartbeat, dizziness, feeling faint or lightheaded, chest pain, trouble breathing High ammonia level--unusual weakness or fatigue, confusion, loss of appetite, nausea, vomiting, seizures Infection--fever, chills, cough, sore throat, wounds that don't heal, pain or trouble when passing urine, general feeling of discomfort or being unwell Low red blood cell level--unusual weakness or fatigue, dizziness, headache, trouble breathing Pain, tingling, or numbness in the hands or feet, muscle weakness, change in vision, confusion or trouble speaking, loss of balance or coordination, trouble walking, seizures Redness, swelling, and blistering of the skin over hands and feet Severe or prolonged diarrhea Unusual bruising or bleeding Side effects that usually do not require medical attention (report to your care team if they continue or are bothersome): Dry skin Headache Increased tears Nausea Pain, redness, or swelling with sores inside the mouth or throat Sensitivity to light Vomiting This list may not describe all possible side effects. Call your doctor for medical advice about side effects. You may report side effects to FDA at 1-800-FDA-1088. Where should I keep my medication? This medication is given in a hospital or clinic. It will not be stored at home.  NOTE: This sheet is a summary. It may not cover all possible information. If you have questions about this medicine, talk to your doctor, pharmacist, or health care provider.  2024 Elsevier/Gold Standard (2022-04-04 00:00:00)      To help prevent nausea and vomiting after your treatment, we encourage you to take your nausea medication as directed.  BELOW ARE SYMPTOMS THAT SHOULD BE REPORTED IMMEDIATELY: *FEVER GREATER THAN 100.4 F (38 C) OR HIGHER *CHILLS OR  SWEATING *NAUSEA AND VOMITING THAT IS NOT CONTROLLED WITH YOUR NAUSEA MEDICATION *UNUSUAL SHORTNESS OF BREATH *UNUSUAL BRUISING OR BLEEDING *URINARY PROBLEMS (pain or burning when urinating, or frequent urination) *BOWEL PROBLEMS (unusual diarrhea, constipation, pain near the anus) TENDERNESS IN MOUTH AND THROAT WITH OR WITHOUT PRESENCE OF ULCERS (sore throat, sores in mouth, or a toothache) UNUSUAL RASH, SWELLING OR PAIN  UNUSUAL VAGINAL DISCHARGE OR ITCHING   Items with * indicate a potential emergency and should be followed up as soon as possible or go to the Emergency Department if any problems should occur.  Please show the CHEMOTHERAPY ALERT CARD or IMMUNOTHERAPY ALERT CARD at check-in to the Emergency Department and triage nurse.  Should you have questions after your visit or need to cancel or reschedule your appointment, please contact [facility] [place] - A DEPT OF Eligha Bridegroom [facility] [phone]  and follow the prompts.  Office hours are 8:00 a.m. to 4:30 p.m. Monday - Friday. Please note that voicemails left after 4:00 p.m. may not be returned until the following business day.  We are closed weekends and major holidays. You have access to a nurse at all times for urgent questions. Please call the main number to the clinic [phone] and follow the prompts.  For any non-urgent questions, you may also contact your provider using MyChart. We now offer e-Visits for anyone 74 and older to request care online for non-urgent symptoms. For details visit [email or web address].   Also download the MyChart app! Go to the app store, search "MyChart", open the app, select [place], and log in with your MyChart username and password.

## 2024-01-16 NOTE — Progress Notes (Signed)
Patient presents today for pump d/c. Vital signs are stable. Port a cath site clean, dry, and intact. Port flushed with 10 mls of Normal Saline and 500 Units of Heparin. Needle removed intact. Band aid applied. Patient has no complaints at this time. Discharged from clinic ambulatory and in stable condition. Patient alert and oriented.  

## 2024-01-21 ENCOUNTER — Other Ambulatory Visit: Payer: Self-pay | Admitting: Hematology

## 2024-01-22 ENCOUNTER — Telehealth: Payer: Self-pay | Admitting: *Deleted

## 2024-01-22 ENCOUNTER — Other Ambulatory Visit: Payer: Self-pay | Admitting: *Deleted

## 2024-01-22 NOTE — Telephone Encounter (Signed)
Patient called asking that Oxycontin 40 mg tablets be discontinued.  He reports that they "do not agree with him" and cause him too much constipation.  States that Ocycodone 10 mg tablets do well for him taken alone.

## 2024-01-26 ENCOUNTER — Other Ambulatory Visit: Payer: Self-pay

## 2024-01-28 ENCOUNTER — Inpatient Hospital Stay: Payer: Medicare Other

## 2024-01-28 VITALS — BP 138/82 | HR 62 | Temp 97.7°F

## 2024-01-28 DIAGNOSIS — C180 Malignant neoplasm of cecum: Secondary | ICD-10-CM | POA: Diagnosis not present

## 2024-01-28 DIAGNOSIS — Z95828 Presence of other vascular implants and grafts: Secondary | ICD-10-CM

## 2024-01-28 LAB — CBC WITH DIFFERENTIAL/PLATELET
Abs Immature Granulocytes: 0.4 10*3/uL — ABNORMAL HIGH (ref 0.00–0.07)
Basophils Absolute: 0.1 10*3/uL (ref 0.0–0.1)
Basophils Relative: 1 %
Eosinophils Absolute: 0.3 10*3/uL (ref 0.0–0.5)
Eosinophils Relative: 3 %
HCT: 36.4 % — ABNORMAL LOW (ref 39.0–52.0)
Hemoglobin: 11.4 g/dL — ABNORMAL LOW (ref 13.0–17.0)
Immature Granulocytes: 4 %
Lymphocytes Relative: 25 %
Lymphs Abs: 2.5 10*3/uL (ref 0.7–4.0)
MCH: 30.3 pg (ref 26.0–34.0)
MCHC: 31.3 g/dL (ref 30.0–36.0)
MCV: 96.8 fL (ref 80.0–100.0)
Monocytes Absolute: 0.9 10*3/uL (ref 0.1–1.0)
Monocytes Relative: 9 %
Neutro Abs: 5.7 10*3/uL (ref 1.7–7.7)
Neutrophils Relative %: 58 %
Platelets: 192 10*3/uL (ref 150–400)
RBC: 3.76 MIL/uL — ABNORMAL LOW (ref 4.22–5.81)
RDW: 17.2 % — ABNORMAL HIGH (ref 11.5–15.5)
WBC: 9.9 10*3/uL (ref 4.0–10.5)
nRBC: 0 % (ref 0.0–0.2)

## 2024-01-28 LAB — COMPREHENSIVE METABOLIC PANEL
ALT: 8 U/L (ref 0–44)
AST: 18 U/L (ref 15–41)
Albumin: 3.4 g/dL — ABNORMAL LOW (ref 3.5–5.0)
Alkaline Phosphatase: 76 U/L (ref 38–126)
Anion gap: 10 (ref 5–15)
BUN: 7 mg/dL — ABNORMAL LOW (ref 8–23)
CO2: 21 mmol/L — ABNORMAL LOW (ref 22–32)
Calcium: 8.8 mg/dL — ABNORMAL LOW (ref 8.9–10.3)
Chloride: 107 mmol/L (ref 98–111)
Creatinine, Ser: 1.03 mg/dL (ref 0.61–1.24)
GFR, Estimated: >60 mL/min (ref >60)
Glucose, Bld: 146 mg/dL — ABNORMAL HIGH (ref 70–99)
Potassium: 3.2 mmol/L — ABNORMAL LOW (ref 3.5–5.1)
Sodium: 138 mmol/L (ref 135–145)
Total Bilirubin: 0.6 mg/dL (ref 0.0–1.2)
Total Protein: 7.3 g/dL (ref 6.5–8.1)

## 2024-01-28 LAB — URINALYSIS, DIPSTICK ONLY
Bilirubin Urine: NEGATIVE
Glucose, UA: NEGATIVE mg/dL
Ketones, ur: NEGATIVE mg/dL
Leukocytes,Ua: NEGATIVE
Nitrite: NEGATIVE
Protein, ur: 100 mg/dL — AB
Specific Gravity, Urine: 1.016 (ref 1.005–1.030)
pH: 5 (ref 5.0–8.0)

## 2024-01-28 LAB — MAGNESIUM: Magnesium: 1.9 mg/dL (ref 1.7–2.4)

## 2024-01-28 MED ORDER — SODIUM CHLORIDE 0.9 % IV SOLN
150.0000 mg | Freq: Once | INTRAVENOUS | Status: AC
  Administered 2024-01-28: 150 mg via INTRAVENOUS
  Filled 2024-01-28: qty 150

## 2024-01-28 MED ORDER — SODIUM CHLORIDE 0.9 % IV SOLN
Freq: Once | INTRAVENOUS | Status: AC
Start: 2024-01-28 — End: 2024-01-28

## 2024-01-28 MED ORDER — SODIUM CHLORIDE 0.9 % IV SOLN
5.0000 mg/kg | Freq: Once | INTRAVENOUS | Status: AC
  Administered 2024-01-28: 400 mg via INTRAVENOUS
  Filled 2024-01-28: qty 16

## 2024-01-28 MED ORDER — SODIUM CHLORIDE 0.9 % IV SOLN
144.0000 mg/m2 | Freq: Once | INTRAVENOUS | Status: AC
  Administered 2024-01-28: 300 mg via INTRAVENOUS
  Filled 2024-01-28: qty 15

## 2024-01-28 MED ORDER — DEXAMETHASONE SODIUM PHOSPHATE 10 MG/ML IJ SOLN
10.0000 mg | Freq: Once | INTRAMUSCULAR | Status: AC
  Administered 2024-01-28: 10 mg via INTRAVENOUS
  Filled 2024-01-28: qty 1

## 2024-01-28 MED ORDER — ATROPINE SULFATE 1 MG/ML IV SOLN
1.0000 mg | Freq: Once | INTRAVENOUS | Status: AC
  Administered 2024-01-28: 1 mg via INTRAVENOUS
  Filled 2024-01-28: qty 1

## 2024-01-28 MED ORDER — POTASSIUM CHLORIDE CRYS ER 20 MEQ PO TBCR
40.0000 meq | EXTENDED_RELEASE_TABLET | Freq: Once | ORAL | Status: AC
  Administered 2024-01-28: 40 meq via ORAL
  Filled 2024-01-28: qty 2

## 2024-01-28 MED ORDER — SODIUM CHLORIDE 0.9 % IV SOLN
1920.0000 mg/m2 | INTRAVENOUS | Status: DC
  Administered 2024-01-28: 3500 mg via INTRAVENOUS
  Filled 2024-01-28: qty 70

## 2024-01-28 MED ORDER — SODIUM CHLORIDE 0.9 % IV SOLN
400.0000 mg/m2 | Freq: Once | INTRAVENOUS | Status: AC
  Administered 2024-01-28: 780 mg via INTRAVENOUS
  Filled 2024-01-28: qty 39

## 2024-01-28 MED ORDER — FLUOROURACIL CHEMO INJECTION 2.5 GM/50ML
320.0000 mg/m2 | Freq: Once | INTRAVENOUS | Status: AC
  Administered 2024-01-28: 600 mg via INTRAVENOUS
  Filled 2024-01-28: qty 12

## 2024-01-28 MED ORDER — SODIUM CHLORIDE 0.9% FLUSH
10.0000 mL | INTRAVENOUS | Status: DC | PRN
  Administered 2024-01-28: 10 mL via INTRAVENOUS

## 2024-01-28 MED ORDER — SODIUM CHLORIDE 0.9% FLUSH: 10.0000 mL | INTRAVENOUS | Status: DC | PRN

## 2024-01-28 MED ORDER — PALONOSETRON HCL INJECTION 0.25 MG/5ML
0.2500 mg | Freq: Once | INTRAVENOUS | Status: AC
  Administered 2024-01-28: 0.25 mg via INTRAVENOUS
  Filled 2024-01-28: qty 5

## 2024-01-28 MED ORDER — HEPARIN SOD (PORK) LOCK FLUSH 100 UNIT/ML IV SOLN: 500.0000 [IU] | Freq: Once | INTRAVENOUS | Status: DC | PRN

## 2024-01-28 NOTE — Progress Notes (Signed)
Patient presents today for MVASI/Folfiri infusions with 5FU pump start per providers order.  Vital signs within parameters for treatment.  Urine protein noted to be 100.  MD notified.  Message received from Kennith Gain RN/Dr. Ellin Saba patient okay to proceed with treatment.  Treatment given today per MD orders.  Tolerated infusion without adverse affects.  Vital signs stable.  5FU pump connected and verified RUN on the screen with the patient No complaints at this time.  Discharge from clinic ambulatory in stable condition.  Alert and oriented X 3.  Follow up with [facility] as scheduled.

## 2024-01-28 NOTE — Patient Instructions (Signed)
[facility] [place] [facility]  Discharge Instructions: Thank you for choosing [facility] to provide your oncology and hematology care.  If you have a lab appointment with the Cancer Center - please note that after April 8th, 2024, all labs will be drawn in the cancer center.  You do not have to check in or register with the main entrance as you have in the past but will complete your check-in in the cancer center.  Wear comfortable clothing and clothing appropriate for easy access to any Portacath or PICC line.   We strive to give you quality time with your provider. You may need to reschedule your appointment if you arrive late (15 or more minutes).  Arriving late affects you and other patients whose appointments are after yours.  Also, if you miss three or more appointments without notifying the office, you may be dismissed from the clinic at the provider's discretion.      For prescription refill requests, have your pharmacy contact our office and allow 72 hours for refills to be completed.    Today you received the following chemotherapy and/or immunotherapy agents MVASI?FOLFIRI with 5FU pump start      To help prevent nausea and vomiting after your treatment, we encourage you to take your nausea medication as directed.  BELOW ARE SYMPTOMS THAT SHOULD BE REPORTED IMMEDIATELY: *FEVER GREATER THAN 100.4 F (38 C) OR HIGHER *CHILLS OR SWEATING *NAUSEA AND VOMITING THAT IS NOT CONTROLLED WITH YOUR NAUSEA MEDICATION *UNUSUAL SHORTNESS OF BREATH *UNUSUAL BRUISING OR BLEEDING *URINARY PROBLEMS (pain or burning when urinating, or frequent urination) *BOWEL PROBLEMS (unusual diarrhea, constipation, pain near the anus) TENDERNESS IN MOUTH AND THROAT WITH OR WITHOUT PRESENCE OF ULCERS (sore throat, sores in mouth, or a toothache) UNUSUAL RASH, SWELLING OR PAIN  UNUSUAL VAGINAL DISCHARGE OR ITCHING   Items with * indicate a potential emergency  and should be followed up as soon as possible or go to the Emergency Department if any problems should occur.  Please show the CHEMOTHERAPY ALERT CARD or IMMUNOTHERAPY ALERT CARD at check-in to the Emergency Department and triage nurse.  Should you have questions after your visit or need to cancel or reschedule your appointment, please contact [facility] CANCER CTR [place] - A DEPT OF [facility] Bridegroom [facility] [phone]  and follow the prompts.  Office hours are 8:00 a.m. to 4:30 p.m. Monday - Friday. Please note that voicemails left after 4:00 p.m. may not be returned until the following business day.  We are closed weekends and major holidays. You have access to a nurse at all times for urgent questions. Please call the main number to the clinic [phone] and follow the prompts.  For any non-urgent questions, you may also contact your provider using MyChart. We now offer e-Visits for anyone 46 and older to request care online for non-urgent symptoms. For details visit [email or web address].   Also download the MyChart app! Go to the app store, search "MyChart", open the app, select [place], and log in with your MyChart username and password.

## 2024-01-28 NOTE — Progress Notes (Signed)
Maintain Fluorouracil IVP dose at 600 mg and Pump at 3500 mg - despite BSA change.    T.O. Dr Carilyn Goodpasture, PharmD

## 2024-01-29 LAB — CEA: CEA: 135 ng/mL — ABNORMAL HIGH (ref 0.0–4.7)

## 2024-01-30 ENCOUNTER — Inpatient Hospital Stay: Payer: Medicare Other

## 2024-01-30 VITALS — BP 126/87 | HR 73 | Temp 97.5°F | Resp 18

## 2024-01-30 DIAGNOSIS — Z95828 Presence of other vascular implants and grafts: Secondary | ICD-10-CM

## 2024-01-30 DIAGNOSIS — C180 Malignant neoplasm of cecum: Secondary | ICD-10-CM | POA: Diagnosis not present

## 2024-01-30 MED ORDER — PEGFILGRASTIM-CBQV 6 MG/0.6ML ~~LOC~~ SOSY
6.0000 mg | PREFILLED_SYRINGE | Freq: Once | SUBCUTANEOUS | Status: AC
  Administered 2024-01-30: 6 mg via SUBCUTANEOUS
  Filled 2024-01-30: qty 0.6

## 2024-01-30 MED ORDER — SODIUM CHLORIDE 0.9% FLUSH
10.0000 mL | INTRAVENOUS | Status: DC | PRN
Start: 2024-01-30 — End: 2024-01-30
  Administered 2024-01-30: 10 mL

## 2024-01-30 MED ORDER — HEPARIN SOD (PORK) LOCK FLUSH 100 UNIT/ML IV SOLN
500.0000 [IU] | Freq: Once | INTRAVENOUS | Status: AC | PRN
  Administered 2024-01-30: 500 [IU]

## 2024-01-30 NOTE — Progress Notes (Signed)
Stevan Born presents to have home infusion pump d/c'd and for port-a-cath deaccess with flush.  Portacath located right chest wall accessed with  H 20 needle.  Good blood return present. Portacath flushed with NS and 500U/33ml Heparin, and needle removed intact.  Procedure tolerated well and without incident.  Udenyca injecton given per orders. Patient tolerated it well without problems. Vitals stable and discharged home from clinic ambulatory. Follow up as scheduled.

## 2024-01-30 NOTE — Patient Instructions (Signed)
[facility] [place] [facility]  Discharge Instructions: Thank you for choosing [facility] to provide your oncology and hematology care.  If you have a lab appointment with the Cancer Center - please note that after April 8th, 2024, all labs will be drawn in the cancer center.  You do not have to check in or register with the main entrance as you have in the past but will complete your check-in in the cancer center.  Wear comfortable clothing and clothing appropriate for easy access to any Portacath or PICC line.   We strive to give you quality time with your provider. You may need to reschedule your appointment if you arrive late (15 or more minutes).  Arriving late affects you and other patients whose appointments are after yours.  Also, if you miss three or more appointments without notifying the office, you may be dismissed from the clinic at the provider's discretion.      For prescription refill requests, have your pharmacy contact our office and allow 72 hours for refills to be completed.    Today you had your ambulatory pump disconnected per protocol. And your udenyca injection given.    To help prevent nausea and vomiting after your treatment, we encourage you to take your nausea medication as directed.  BELOW ARE SYMPTOMS THAT SHOULD BE REPORTED IMMEDIATELY: *FEVER GREATER THAN 100.4 F (38 C) OR HIGHER *CHILLS OR SWEATING *NAUSEA AND VOMITING THAT IS NOT CONTROLLED WITH YOUR NAUSEA MEDICATION *UNUSUAL SHORTNESS OF BREATH *UNUSUAL BRUISING OR BLEEDING *URINARY PROBLEMS (pain or burning when urinating, or frequent urination) *BOWEL PROBLEMS (unusual diarrhea, constipation, pain near the anus) TENDERNESS IN MOUTH AND THROAT WITH OR WITHOUT PRESENCE OF ULCERS (sore throat, sores in mouth, or a toothache) UNUSUAL RASH, SWELLING OR PAIN  UNUSUAL VAGINAL DISCHARGE OR ITCHING   Items with * indicate a potential emergency and should be  followed up as soon as possible or go to the Emergency Department if any problems should occur.  Please show the CHEMOTHERAPY ALERT CARD or IMMUNOTHERAPY ALERT CARD at check-in to the Emergency Department and triage nurse.  Should you have questions after your visit or need to cancel or reschedule your appointment, please contact [facility] CANCER CTR [place] - A DEPT OF [facility] Bridegroom [facility] [phone]  and follow the prompts.  Office hours are 8:00 a.m. to 4:30 p.m. Monday - Friday. Please note that voicemails left after 4:00 p.m. may not be returned until the following business day.  We are closed weekends and major holidays. You have access to a nurse at all times for urgent questions. Please call the main number to the clinic [phone] and follow the prompts.  For any non-urgent questions, you may also contact your provider using MyChart. We now offer e-Visits for anyone 51 and older to request care online for non-urgent symptoms. For details visit [email or web address].   Also download the MyChart app! Go to the app store, search "MyChart", open the app, select [place], and log in with your MyChart username and password.

## 2024-02-11 ENCOUNTER — Inpatient Hospital Stay: Payer: Medicare Other | Attending: Hematology

## 2024-02-11 ENCOUNTER — Encounter: Payer: Self-pay | Admitting: Hematology

## 2024-02-11 ENCOUNTER — Inpatient Hospital Stay: Payer: Medicare Other

## 2024-02-11 VITALS — BP 123/77 | HR 61 | Temp 97.5°F | Resp 18

## 2024-02-11 DIAGNOSIS — C7972 Secondary malignant neoplasm of left adrenal gland: Secondary | ICD-10-CM | POA: Diagnosis not present

## 2024-02-11 DIAGNOSIS — C7801 Secondary malignant neoplasm of right lung: Secondary | ICD-10-CM | POA: Diagnosis not present

## 2024-02-11 DIAGNOSIS — C180 Malignant neoplasm of cecum: Secondary | ICD-10-CM | POA: Insufficient documentation

## 2024-02-11 DIAGNOSIS — C7802 Secondary malignant neoplasm of left lung: Secondary | ICD-10-CM | POA: Insufficient documentation

## 2024-02-11 DIAGNOSIS — Z5189 Encounter for other specified aftercare: Secondary | ICD-10-CM | POA: Diagnosis not present

## 2024-02-11 DIAGNOSIS — Z87891 Personal history of nicotine dependence: Secondary | ICD-10-CM | POA: Diagnosis not present

## 2024-02-11 DIAGNOSIS — Z5111 Encounter for antineoplastic chemotherapy: Secondary | ICD-10-CM | POA: Insufficient documentation

## 2024-02-11 DIAGNOSIS — Z5112 Encounter for antineoplastic immunotherapy: Secondary | ICD-10-CM | POA: Insufficient documentation

## 2024-02-11 DIAGNOSIS — C189 Malignant neoplasm of colon, unspecified: Secondary | ICD-10-CM | POA: Diagnosis not present

## 2024-02-11 DIAGNOSIS — Z95828 Presence of other vascular implants and grafts: Secondary | ICD-10-CM

## 2024-02-11 LAB — CBC WITH DIFFERENTIAL/PLATELET
Abs Immature Granulocytes: 0.72 10*3/uL — ABNORMAL HIGH (ref 0.00–0.07)
Basophils Absolute: 0.1 10*3/uL (ref 0.0–0.1)
Basophils Relative: 1 %
Eosinophils Absolute: 0.3 10*3/uL (ref 0.0–0.5)
Eosinophils Relative: 2 %
HCT: 34.7 % — ABNORMAL LOW (ref 39.0–52.0)
Hemoglobin: 11.1 g/dL — ABNORMAL LOW (ref 13.0–17.0)
Immature Granulocytes: 5 %
Lymphocytes Relative: 20 %
Lymphs Abs: 2.8 10*3/uL (ref 0.7–4.0)
MCH: 30.7 pg (ref 26.0–34.0)
MCHC: 32 g/dL (ref 30.0–36.0)
MCV: 96.1 fL (ref 80.0–100.0)
Monocytes Absolute: 1.3 10*3/uL — ABNORMAL HIGH (ref 0.1–1.0)
Monocytes Relative: 9 %
Neutro Abs: 8.3 10*3/uL — ABNORMAL HIGH (ref 1.7–7.7)
Neutrophils Relative %: 63 %
Platelets: 201 10*3/uL (ref 150–400)
RBC: 3.61 MIL/uL — ABNORMAL LOW (ref 4.22–5.81)
RDW: 16.9 % — ABNORMAL HIGH (ref 11.5–15.5)
WBC: 13.5 10*3/uL — ABNORMAL HIGH (ref 4.0–10.5)
nRBC: 0.1 % (ref 0.0–0.2)

## 2024-02-11 LAB — COMPREHENSIVE METABOLIC PANEL
ALT: 10 U/L (ref 0–44)
AST: 20 U/L (ref 15–41)
Albumin: 3.5 g/dL (ref 3.5–5.0)
Alkaline Phosphatase: 85 U/L (ref 38–126)
Anion gap: 9 (ref 5–15)
BUN: 10 mg/dL (ref 8–23)
CO2: 22 mmol/L (ref 22–32)
Calcium: 9 mg/dL (ref 8.9–10.3)
Chloride: 108 mmol/L (ref 98–111)
Creatinine, Ser: 1.01 mg/dL (ref 0.61–1.24)
GFR, Estimated: >60 mL/min (ref >60)
Glucose, Bld: 92 mg/dL (ref 70–99)
Potassium: 3.8 mmol/L (ref 3.5–5.1)
Sodium: 139 mmol/L (ref 135–145)
Total Bilirubin: 0.2 mg/dL (ref 0.0–1.2)
Total Protein: 7.3 g/dL (ref 6.5–8.1)

## 2024-02-11 LAB — URINALYSIS, DIPSTICK ONLY
Bilirubin Urine: NEGATIVE
Glucose, UA: NEGATIVE mg/dL
Hgb urine dipstick: NEGATIVE
Ketones, ur: NEGATIVE mg/dL
Leukocytes,Ua: NEGATIVE
Nitrite: NEGATIVE
Protein, ur: NEGATIVE mg/dL
Specific Gravity, Urine: 1.015 (ref 1.005–1.030)
pH: 5 (ref 5.0–8.0)

## 2024-02-11 LAB — MAGNESIUM: Magnesium: 1.9 mg/dL (ref 1.7–2.4)

## 2024-02-11 MED ORDER — SODIUM CHLORIDE 0.9 % IV SOLN: Freq: Once | INTRAVENOUS | Status: AC

## 2024-02-11 MED ORDER — SODIUM CHLORIDE 0.9 % IV SOLN
5.0000 mg/kg | Freq: Once | INTRAVENOUS | Status: AC
  Administered 2024-02-11: 400 mg via INTRAVENOUS
  Filled 2024-02-11: qty 16

## 2024-02-11 MED ORDER — PALONOSETRON HCL INJECTION 0.25 MG/5ML
0.2500 mg | Freq: Once | INTRAVENOUS | Status: AC
  Administered 2024-02-11: 0.25 mg via INTRAVENOUS
  Filled 2024-02-11: qty 5

## 2024-02-11 MED ORDER — ATROPINE SULFATE 1 MG/ML IV SOLN
1.0000 mg | Freq: Once | INTRAVENOUS | Status: AC | PRN
  Administered 2024-02-11: 1 mg via INTRAVENOUS
  Filled 2024-02-11: qty 1

## 2024-02-11 MED ORDER — SODIUM CHLORIDE 0.9 % IV SOLN
400.0000 mg/m2 | Freq: Once | INTRAVENOUS | Status: AC
  Administered 2024-02-11: 780 mg via INTRAVENOUS
  Filled 2024-02-11: qty 39

## 2024-02-11 MED ORDER — SODIUM CHLORIDE 0.9% FLUSH
10.0000 mL | INTRAVENOUS | Status: DC | PRN
Start: 2024-02-11 — End: 2024-02-11

## 2024-02-11 MED ORDER — HEPARIN SOD (PORK) LOCK FLUSH 100 UNIT/ML IV SOLN: 500.0000 [IU] | Freq: Once | INTRAVENOUS | Status: DC | PRN

## 2024-02-11 MED ORDER — SODIUM CHLORIDE 0.9 % IV SOLN
150.0000 mg | Freq: Once | INTRAVENOUS | Status: AC
  Administered 2024-02-11: 150 mg via INTRAVENOUS
  Filled 2024-02-11: qty 150

## 2024-02-11 MED ORDER — FLUOROURACIL CHEMO INJECTION 2.5 GM/50ML
320.0000 mg/m2 | Freq: Once | INTRAVENOUS | Status: AC
  Administered 2024-02-11: 600 mg via INTRAVENOUS
  Filled 2024-02-11: qty 12

## 2024-02-11 MED ORDER — DEXAMETHASONE SODIUM PHOSPHATE 10 MG/ML IJ SOLN
10.0000 mg | Freq: Once | INTRAMUSCULAR | Status: AC
  Administered 2024-02-11: 10 mg via INTRAVENOUS
  Filled 2024-02-11: qty 1

## 2024-02-11 MED ORDER — SODIUM CHLORIDE 0.9 % IV SOLN
1920.0000 mg/m2 | INTRAVENOUS | Status: DC
  Administered 2024-02-11: 3500 mg via INTRAVENOUS
  Filled 2024-02-11: qty 70

## 2024-02-11 MED ORDER — SODIUM CHLORIDE 0.9 % IV SOLN
144.0000 mg/m2 | Freq: Once | INTRAVENOUS | Status: AC
  Administered 2024-02-11: 300 mg via INTRAVENOUS
  Filled 2024-02-11: qty 15

## 2024-02-11 NOTE — Patient Instructions (Signed)
 [facility] [place] [facility]  Discharge Instructions: Thank you for choosing [facility] to provide your oncology and hematology care.  If you have a lab appointment with the Cancer Center - please note that after April 8th, 2024, all labs will be drawn in the cancer center.  You do not have to check in or register with the main entrance as you have in the past but will complete your check-in in the cancer center.  Wear comfortable clothing and clothing appropriate for easy access to any Portacath or PICC line.   We strive to give you quality time with your provider. You may need to reschedule your appointment if you arrive late (15 or more minutes).  Arriving late affects you and other patients whose appointments are after yours.  Also, if you miss three or more appointments without notifying the office, you may be dismissed from the clinic at the provider's discretion.      For prescription refill requests, have your pharmacy contact our office and allow 72 hours for refills to be completed.    Today you received the following chemotherapy and/or immunotherapy agents Fofiri 5FU pump start      To help prevent nausea and vomiting after your treatment, we encourage you to take your nausea medication as directed.  BELOW ARE SYMPTOMS THAT SHOULD BE REPORTED IMMEDIATELY: *FEVER GREATER THAN 100.4 F (38 C) OR HIGHER *CHILLS OR SWEATING *NAUSEA AND VOMITING THAT IS NOT CONTROLLED WITH YOUR NAUSEA MEDICATION *UNUSUAL SHORTNESS OF BREATH *UNUSUAL BRUISING OR BLEEDING *URINARY PROBLEMS (pain or burning when urinating, or frequent urination) *BOWEL PROBLEMS (unusual diarrhea, constipation, pain near the anus) TENDERNESS IN MOUTH AND THROAT WITH OR WITHOUT PRESENCE OF ULCERS (sore throat, sores in mouth, or a toothache) UNUSUAL RASH, SWELLING OR PAIN  UNUSUAL VAGINAL DISCHARGE OR ITCHING   Items with * indicate a potential emergency and should be  followed up as soon as possible or go to the Emergency Department if any problems should occur.  Please show the CHEMOTHERAPY ALERT CARD or IMMUNOTHERAPY ALERT CARD at check-in to the Emergency Department and triage nurse.  Should you have questions after your visit or need to cancel or reschedule your appointment, please contact [facility] CANCER CTR [place] - A DEPT OF Eligha Bridegroom [facility] [phone]  and follow the prompts.  Office hours are 8:00 a.m. to 4:30 p.m. Monday - Friday. Please note that voicemails left after 4:00 p.m. may not be returned until the following business day.  We are closed weekends and major holidays. You have access to a nurse at all times for urgent questions. Please call the main number to the clinic [phone] and follow the prompts.  For any non-urgent questions, you may also contact your provider using MyChart. We now offer e-Visits for anyone 2 and older to request care online for non-urgent symptoms. For details visit [email or web address].   Also download the MyChart app! Go to the app store, search "MyChart", open the app, select [place], and log in with your MyChart username and password.

## 2024-02-11 NOTE — Progress Notes (Signed)
 Patient presents today for FOLFIRI with 5FU pump start.  Vital signs and labs within parameters for treatment.  Patient has no new complaints at this time.    Treatment given today per MD orders.  Tolerated infusion without adverse affects.  Vital signs stable.  5FU pump connected and verified RUN on the screen with the patient.  No complaints at this time.  Discharge from clinic ambulatory in stable condition.  Alert and oriented X 3.  Follow up with [facility] as scheduled.

## 2024-02-11 NOTE — Progress Notes (Signed)
 Maintain Fluorouracil IVP dose at 600 mg and Pump at 3500 mg - despite BSA change.    T.O. Dr Carilyn Goodpasture, PharmD

## 2024-02-12 LAB — CEA: CEA: 181 ng/mL — ABNORMAL HIGH (ref 0.0–4.7)

## 2024-02-13 ENCOUNTER — Inpatient Hospital Stay: Payer: Medicare Other

## 2024-02-13 VITALS — BP 98/79 | HR 84 | Temp 98.2°F | Resp 18

## 2024-02-13 DIAGNOSIS — C7802 Secondary malignant neoplasm of left lung: Secondary | ICD-10-CM | POA: Diagnosis not present

## 2024-02-13 DIAGNOSIS — C7801 Secondary malignant neoplasm of right lung: Secondary | ICD-10-CM | POA: Diagnosis not present

## 2024-02-13 DIAGNOSIS — Z87891 Personal history of nicotine dependence: Secondary | ICD-10-CM | POA: Diagnosis not present

## 2024-02-13 DIAGNOSIS — C180 Malignant neoplasm of cecum: Secondary | ICD-10-CM | POA: Diagnosis not present

## 2024-02-13 DIAGNOSIS — Z95828 Presence of other vascular implants and grafts: Secondary | ICD-10-CM

## 2024-02-13 DIAGNOSIS — Z5111 Encounter for antineoplastic chemotherapy: Secondary | ICD-10-CM | POA: Diagnosis not present

## 2024-02-13 DIAGNOSIS — C7972 Secondary malignant neoplasm of left adrenal gland: Secondary | ICD-10-CM | POA: Diagnosis not present

## 2024-02-13 DIAGNOSIS — Z5112 Encounter for antineoplastic immunotherapy: Secondary | ICD-10-CM | POA: Diagnosis not present

## 2024-02-13 DIAGNOSIS — Z5189 Encounter for other specified aftercare: Secondary | ICD-10-CM | POA: Diagnosis not present

## 2024-02-13 MED ORDER — PEGFILGRASTIM-CBQV 6 MG/0.6ML ~~LOC~~ SOSY
6.0000 mg | PREFILLED_SYRINGE | Freq: Once | SUBCUTANEOUS | Status: AC
  Administered 2024-02-13: 6 mg via SUBCUTANEOUS
  Filled 2024-02-13: qty 0.6

## 2024-02-13 MED ORDER — SODIUM CHLORIDE 0.9% FLUSH
10.0000 mL | INTRAVENOUS | Status: DC | PRN
  Administered 2024-02-13: 10 mL

## 2024-02-13 MED ORDER — HEPARIN SOD (PORK) LOCK FLUSH 100 UNIT/ML IV SOLN
500.0000 [IU] | Freq: Once | INTRAVENOUS | Status: AC | PRN
  Administered 2024-02-13: 500 [IU]

## 2024-02-13 NOTE — Patient Instructions (Signed)

## 2024-02-13 NOTE — Progress Notes (Signed)
 Patient for chemotherapy pump disconnect with no complaints voiced.  Patients port flushed without difficulty.  Good blood return noted with no bruising or swelling noted at site.  Band aid applied.  VSS with discharge and left ambulatory with no s/s of distress noted.

## 2024-02-22 ENCOUNTER — Encounter: Payer: Self-pay | Admitting: Hematology

## 2024-02-22 ENCOUNTER — Encounter (HOSPITAL_COMMUNITY): Payer: Self-pay | Admitting: Hematology

## 2024-02-23 DIAGNOSIS — C189 Malignant neoplasm of colon, unspecified: Secondary | ICD-10-CM | POA: Diagnosis not present

## 2024-02-24 NOTE — Progress Notes (Signed)
 [facility] [address], [place] [address]    Clinic Day:  02/25/2024  Referring physician: [name], PA  Patient Care Team: [name], [name] as PCP - General (Physician Assistant) [name], MD as Medical Oncologist (Medical Oncology) Therese Sarah, RN as Oncology Nurse Navigator (Medical Oncology)   ASSESSMENT & PLAN:   Assessment: 1. Left lung mass with multiple lung nodules: - Presentation with dry cough for 6 months. - 30 pound weight loss in the last couple of years, weight stable over the last 6 months. - CT chest with contrast on 12/16/2021 showed bulky left hilar mass measuring 8.1 x 7.5 cm.  Numerous bilateral lung nodules of varying sizes.  Left adrenal nodule measuring 2.8 x 2.2 cm.  Mediastinal and bilateral hilar adenopathy with the largest pretracheal node measuring 4.1 x 3.1 cm. - MRI of the brain from 01/04/2022 which was negative for metastatic disease. - CTAP from 01/03/2022 which showed isolated left adrenal metastasis with no other evidence of metastatic disease in the abdomen or pelvis. - Pathology of left lung biopsy which shows adenocarcinoma with necrosis.  CK20 positive and CDX2 positive but negative for CK7 indicating colonic primary. 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  HER2 negative.  TMB low.  MSI-stable.  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  Other targetable mutations negative. - FOLFIRI started on 02/22/2022, bevacizumab added with cycle 4  - CT CAP (05/17/2022): Mediastinal and hilar lymph nodes have decreased in size.  Largest perihilar left lower lobe lung mass has decreased in size.  Right upper lobe mass slightly increased in size.  Other nodules are stable.  Left adrenal mass decreased to 1.3 cm from 2.8 cm. - CT scan showed mixed response as it was compared to CT from 12/16/2021.  He did not start chemotherapy until 02/22/2022. - Maintenance 5-FU and bevacizumab from September 2023 through 08/29/2023 with  progression - FOLFIRI and bevacizumab started on 09/12/2023   2. Social/family history: - Lives by himself.  He paints houses for living. - He quit smoking 12 years back and started back again 1 and half year ago and smoked half pack per day.  He quit again about 1 week ago. - Father had cancer, type unknown to the patient.  Brother died of brain tumor.  3.  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  Margins negative.  No lymphovascular or perineural invasion. - Received 3 cycles of XELOX followed by Xeloda for total of 6 months.  Oxaliplatin discontinued during cycle 4 secondary to transaminitis, elevated bilirubin and thrombocytopenia.  However he was also treated for hep C with Harvoni after that.    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 - Occasionally blood-tinged mucus when he blows his nose hard in the mornings.  No active nosebleeds.  Denies any bleeding per rectum. - He lost about 5 to 8 pounds.  He reports he did not take Megace for the last 2 weeks but started taking it today morning.  Diarrhea alternating with constipation is stable. - Reviewed labs: Normal LFTs and creatinine.  CBC grossly normal.  CEA has been trending up and down. - Recommend continue treatment every 2 weeks.  RTC 6 weeks for follow-up.  Will repeat CT CAP with contrast prior to next visit.  2.  Lower rib/epigastric pain: -  Continue oxycodone 10 mg every 6 hours as needed.  Pain is well-controlled.  3.  Difficulty falling asleep: - Continue trazodone as needed.  4.  Hypertension: - Continue amlodipine 10 mg daily.  He is taking lisinopril 10 mg in the evenings if the blood pressure is high.  Blood pressure is well-controlled today at 108/81.   5.  Hypokalemia: - Continue K-Dur 20 mill equivalents daily.  Potassium is normal.    Orders  Placed This Encounter  Procedures   CT CHEST ABDOMEN PELVIS W CONTRAST    Standing Status:   Future    Expected Date:   03/27/2024    Expiration Date:   02/24/2025    If indicated for the ordered procedure, I authorize the administration of contrast media per Radiology protocol:   Yes    Does the patient have a contrast media/X-ray dye allergy?:   No    Preferred imaging location?:   [facility]    If indicated for the ordered procedure, I authorize the administration of oral contrast media per Radiology protocol:   Yes   CEA    Standing Status:   Future    Expected Date:   03/23/2024    Expiration Date:   03/23/2025   Magnesium    Standing Status:   Future    Expected Date:   03/23/2024    Expiration Date:   03/23/2025   CBC with Differential    Standing Status:   Future    Expected Date:   03/23/2024    Expiration Date:   03/23/2025   Comprehensive metabolic panel    Standing Status:   Future    Expected Date:   03/23/2024    Expiration Date:   03/23/2025   Urinalysis, dipstick only    Standing Status:   Future    Expected Date:   03/23/2024    Expiration Date:   03/23/2025   CEA    Standing Status:   Future    Expected Date:   04/06/2024    Expiration Date:   04/06/2025   Magnesium    Standing Status:   Future    Expected Date:   04/06/2024    Expiration Date:   04/06/2025   CBC with Differential    Standing Status:   Future    Expected Date:   04/06/2024    Expiration Date:   04/06/2025   Comprehensive metabolic panel    Standing Status:   Future    Expected Date:   04/06/2024    Expiration Date:   04/06/2025   Urinalysis, dipstick only    Standing Status:   Future    Expected Date:   04/06/2024    Expiration Date:   04/06/2025   CEA    Standing Status:   Future    Expected Date:   04/20/2024    Expiration Date:   04/20/2025   Magnesium    Standing Status:   Future    Expected Date:   04/20/2024    Expiration Date:   04/20/2025   CBC with Differential    Standing Status:    Future    Expected Date:   04/20/2024    Expiration Date:   04/20/2025   Comprehensive metabolic panel    Standing Status:   Future    Expected Date:   04/20/2024    Expiration Date:   04/20/2025   Urinalysis, dipstick only    Standing Status:   Future    Expected Date:  04/20/2024    Expiration Date:   04/20/2025   CEA    Standing Status:   Future    Expected Date:   05/04/2024    Expiration Date:   05/04/2025   Magnesium    Standing Status:   Future    Expected Date:   05/04/2024    Expiration Date:   05/04/2025   CBC with Differential    Standing Status:   Future    Expected Date:   05/04/2024    Expiration Date:   05/04/2025   Comprehensive metabolic panel    Standing Status:   Future    Expected Date:   05/04/2024    Expiration Date:   05/04/2025   Urinalysis, dipstick only    Standing Status:   Future    Expected Date:   05/04/2024    Expiration Date:   05/04/2025   CEA    Standing Status:   Future    Expected Date:   05/18/2024    Expiration Date:   05/18/2025   Magnesium    Standing Status:   Future    Expected Date:   05/18/2024    Expiration Date:   05/18/2025   CBC with Differential    Standing Status:   Future    Expected Date:   05/18/2024    Expiration Date:   05/18/2025   Comprehensive metabolic panel    Standing Status:   Future    Expected Date:   05/18/2024    Expiration Date:   05/18/2025   Urinalysis, dipstick only    Standing Status:   Future    Expected Date:   05/18/2024    Expiration Date:   05/18/2025      I,Katie Daubenspeck,acting as a scribe for [name], MD.,have documented all relevant documentation on the behalf of [name], MD,as directed by  [name], MD while in the presence of [name], MD.   I, [name] MD, have reviewed the above documentation for accuracy and completeness, and I agree with the above.   [name], MD   3/17/202510:51 AM  CHIEF COMPLAINT:   Diagnosis: metastatic colon  cancer to the lungs and left adrenal gland    Cancer Staging  Cecal cancer [facility]) Staging form: Colon and Rectum, AJCC 8th Edition - Clinical stage from 10/07/2019: Stage IIIB (cT3, cN1, cM0) - Signed by [name], MD on 10/07/2019 - Pathologic stage from 01/23/2022: Stage IVB (rpTX, pN0, pM1b) - Unsigned    Prior Therapy: 1. FOLFIRI 02/22/22 - 08/08/22  2. Maintenance 5-FU and bevacizumab, 08/2022 - 08/29/23  Current Therapy:  FOLFIRI and bevacizumab    HISTORY OF PRESENT ILLNESS:   Oncology History  Cecal cancer (HCC)  10/07/2019 Initial Diagnosis   Cecal cancer (HCC)   10/07/2019 Cancer Staging   Staging form: Colon and Rectum, AJCC 8th Edition - Clinical stage from 10/07/2019: Stage IIIB (cT3, cN1, cM0) - Signed by [name], MD on 10/07/2019   02/22/2022 - 08/08/2022 Chemotherapy   Patient is on Treatment Plan : COLORECTAL FOLFIRI / BEVACIZUMAB Q14D     02/22/2022 -  Chemotherapy   Patient is on Treatment Plan : COLORECTAL FOLFIRI + Bevacizumab q14d        INTERVAL HISTORY:   Evan [name] is a 68 y.o. male presenting to clinic today for follow up of metastatic colon cancer to the lungs and left adrenal gland. He was last seen by me on 01/14/24.  Today, he states that he is doing well overall.  His appetite level is at 30%. His energy level is at 90%.  PAST MEDICAL HISTORY:   Past Medical History: Past Medical History:  Diagnosis Date   Arthritis    Colon cancer (HCC)    colon   Hepatitis C    Hypertension    Port-A-Cath in place 02/15/2022    Surgical History: Past Surgical History:  Procedure Laterality Date   BIOPSY  03/12/2023   Procedure: BIOPSY;  Surgeon: Lanelle Bal, DO;  [facility];  Service: Endoscopy;;   COLON SURGERY     ESOPHAGEAL BANDING N/A 03/12/2023   Procedure: ESOPHAGEAL BANDING;  Surgeon: Lanelle Bal, DO;  [facility];  Service: Endoscopy;  Laterality: N/A;   ESOPHAGOGASTRODUODENOSCOPY (EGD) WITH  PROPOFOL N/A 03/12/2023   Procedure: ESOPHAGOGASTRODUODENOSCOPY (EGD) WITH PROPOFOL;  Surgeon: Lanelle Bal, DO;  Location: [facility];  Service: Endoscopy;  Laterality: N/A;  11:30AM; ASA 3   PORTACATH PLACEMENT Right 02/08/2022   Procedure: INSERTION PORT-A-CATH- RIJ;  Surgeon: Lewie Chamber, DO;  Location: [facility];  Service: General;  Laterality: Right;   REPLACEMENT TOTAL KNEE Left    SHOULDER ARTHROSCOPY Bilateral    TOTAL HIP ARTHROPLASTY Right 11/09/2020   Procedure: TOTAL HIP ARTHROPLASTY ANTERIOR APPROACH;  Surgeon: Sheral Apley, MD;  Location: [facility];  Service: Orthopedics;  Laterality: Right;   WRIST SURGERY Left     Social History: Social History   Socioeconomic History   Marital status: Married    Spouse name: Not on file   Number of children: 7   Years of education: Not on file   Highest education level: Not on file  Occupational History   Occupation: employed  Tobacco Use   Smoking status: Former    Current packs/day: 0.00    Average packs/day: 1.5 packs/day for 20.0 years (30.0 ttl pk-yrs)    Types: Cigarettes    Start date: 11/19/1985    Quit date: 11/19/2005    Years since quitting: 18.2   Smokeless tobacco: Never  Vaping Use   Vaping status: Never Used  Substance and Sexual Activity   Alcohol use: Not Currently   Drug use: Never   Sexual activity: Yes  Other Topics Concern   Not on file  Social History Narrative   ** Merged History Encounter **       Separated from wife 11/2021   Social Drivers of Health   Financial Resource Strain: Low Risk  (10/07/2019)   Overall Financial Resource Strain (CARDIA)    Difficulty of Paying Living Expenses: Not very hard  Food Insecurity: No Food Insecurity (10/07/2019)   Hunger Vital Sign    Worried About Running Out of Food in the Last Year: Never true    Ran Out of Food in the Last Year: Never true  Transportation Needs: No Transportation Needs (10/07/2019)   PRAPARE - Therapist, art (Medical): No    Lack of Transportation (Non-Medical): No  Physical Activity: Inactive (10/07/2019)   Exercise Vital Sign    Days of Exercise per Week: 0 days    Minutes of Exercise per Session: 0 min  Stress: No Stress Concern Present (10/07/2019)   [employer] of Occupational Health - Occupational Stress Questionnaire    Feeling of Stress : Not at all  Social Connections: Moderately Integrated (10/07/2019)   Social Connection and Isolation Panel [NHANES]    Frequency of Communication with Friends and Family: Once a week    Frequency of  Social Gatherings with Friends and Family: Once a week    Attends Religious Services: More than 4 times per year    Active Member of [employer] or Organizations: Yes    Attends Engineer, structural: More than 4 times per year    Marital Status: Married  Catering manager Violence: Not At Risk (10/07/2019)   Humiliation, Afraid, Rape, and Kick questionnaire    Fear of Current or Ex-Partner: No    Emotionally Abused: No    Physically Abused: No    Sexually Abused: No    Family History: Family History  Problem Relation Age of Onset   Heart disease Mother    Dementia Father    Heart disease Sister    Cancer Brother    Hypertension Brother    Hypertension Brother    Healthy Son    Healthy Son    Healthy Son    Healthy Son    Healthy Daughter    Healthy Daughter    Healthy Daughter     Current Medications:  Current Outpatient Medications:    aluminum-magnesium hydroxide-simethicone (MAALOX) 200-200-20 MG/5ML SUSP, Take 30 mLs by mouth 4 (four) times daily -  before meals and at bedtime., Disp: 1680 mL, Rfl: 2   amLODipine (NORVASC) 10 MG tablet, Take 1 tablet (10 mg total) by mouth daily., Disp: 90 tablet, Rfl: 3   Bevacizumab (AVASTIN IV), Inject into the vein every 14 (fourteen) days. *start date TBD, Disp: , Rfl:    ENULOSE 10 GM/15ML SOLN, Take 10 g by mouth daily as needed., Disp: , Rfl:    fluorouracil  CALGB 62952 2,400 mg/m2 in sodium chloride 0.9 % 150 mL, Inject 2,400 mg/m2 into the vein over 48 hr., Disp: , Rfl:    FLUOROURACIL IV, Inject into the vein every 14 (fourteen) days., Disp: , Rfl:    Lactulose 20 GM/30ML SOLN, Take 15 mLs (10 g total) by mouth at bedtime. Take 15 ml at bedtime every night to assist with regular bowel movements.  Titrate down if having multiple bowel movements.  If a bowel movement has not occurred in 3 to 4 days or longer, then take 15 ml every 3 hours until a bowel movent has occurred., Disp: 450 mL, Rfl: 5   LEUCOVORIN CALCIUM IV, Inject into the vein every 14 (fourteen) days., Disp: , Rfl:    lidocaine-prilocaine (EMLA) cream, Apply 1 Application topically as needed (Apply to port site 30 mins- 1 hour prior to treatment/labs.)., Disp: 30 g, Rfl: 0   lisinopril (ZESTRIL) 10 MG tablet, Take 1 tablet (10 mg total) by mouth daily., Disp: 30 tablet, Rfl: 3   loperamide (IMODIUM) 2 MG capsule, Take 1 capsule (2 mg total) by mouth as needed for diarrhea or loose stools (Take 2 capsules after first loose stool and then 1 capsule after each loose stool, but do not exceed more than 8 capsules in a 24 hour period)., Disp: 30 capsule, Rfl: 0   megestrol (MEGACE) 400 MG/10ML suspension, Take 10 mLs (400 mg total) by mouth 2 (two) times daily., Disp: 480 mL, Rfl: 3   Misc. Devices MISC, Please provide patient with 1:1 ratio of magic mouthwash and lidocaine to swish and swallow QID, Disp: 1 each, Rfl: 3   naloxone (NARCAN) nasal spray 4 mg/0.1 mL, 0.42ml nasal spray in one nostril. May repeat the dose in other nostril in 2-3 minutes if needed., Disp: 2 each, Rfl: 3   Oxycodone HCl 10 MG TABS, TAKE ONE TABLET BY MOUTH  EVERY 4 HOURS AS NEEDED, Disp: 180 tablet, Rfl: 0   pantoprazole (PROTONIX) 40 MG tablet, Take 1 tablet (40 mg total) by mouth daily., Disp: 30 tablet, Rfl: 11   potassium chloride SA (KLOR-CON M) 20 MEQ tablet, Take 1 tablet (20 mEq total) by mouth daily., Disp: 90  tablet, Rfl: 4   Allergies: No Known Allergies  REVIEW OF SYSTEMS:   Review of Systems  Constitutional:  Negative for chills, fatigue and fever.  HENT:   Negative for lump/mass, mouth sores, nosebleeds, sore throat and trouble swallowing.   Eyes:  Negative for eye problems.  Respiratory:  Negative for cough and shortness of breath.   Cardiovascular:  Positive for palpitations. Negative for chest pain and leg swelling.  Gastrointestinal:  Positive for constipation and diarrhea. Negative for abdominal pain, nausea and vomiting.  Genitourinary:  Negative for bladder incontinence, difficulty urinating, dysuria, frequency, hematuria and nocturia.   Musculoskeletal:  Negative for arthralgias, back pain, flank pain, myalgias and neck pain.  Skin:  Negative for itching and rash.  Neurological:  Negative for dizziness, headaches and numbness.  Hematological:  Does not bruise/bleed easily.  Psychiatric/Behavioral:  Negative for depression, sleep disturbance and suicidal ideas. The patient is not nervous/anxious.   All other systems reviewed and are negative.    VITALS:   Blood pressure 108/81, pulse 89, temperature 98.3 F (36.8 C), temperature source Oral, resp. rate 16, weight 185 lb 13.6 oz (84.3 kg), SpO2 98%.  Wt Readings from Last 3 Encounters:  02/25/24 185 lb 13.6 oz (84.3 kg)  02/11/24 193 lb 8 oz (87.8 kg)  01/28/24 180 lb 8.9 oz (81.9 kg)    Body mass index is 25.92 kg/m.  Performance status (ECOG): 1 - Symptomatic but completely ambulatory  PHYSICAL EXAM:   Physical Exam Vitals and nursing note reviewed. Exam conducted with a chaperone present.  Constitutional:      Appearance: Normal appearance.  Cardiovascular:     Rate and Rhythm: Normal rate and regular rhythm.     Pulses: Normal pulses.     Heart sounds: Normal heart sounds.  Pulmonary:     Effort: Pulmonary effort is normal.     Breath sounds: Normal breath sounds.  Abdominal:     Palpations: Abdomen is  soft. There is no hepatomegaly, splenomegaly or mass.     Tenderness: There is no abdominal tenderness.  Musculoskeletal:     Right lower leg: No edema.     Left lower leg: No edema.  Lymphadenopathy:     Cervical: No cervical adenopathy.     Right cervical: No superficial, deep or posterior cervical adenopathy.    Left cervical: No superficial, deep or posterior cervical adenopathy.     Upper Body:     Right upper body: No supraclavicular or axillary adenopathy.     Left upper body: No supraclavicular or axillary adenopathy.  Neurological:     General: No focal deficit present.     Mental Status: He is alert and oriented to person, place, and time.  Psychiatric:        Mood and Affect: Mood normal.        Behavior: Behavior normal.     LABS:   CBC     Component Value Date/Time   WBC 16.6 (H) 02/25/2024 1002   RBC 3.79 (L) 02/25/2024 1002   HGB 11.1 (L) 02/25/2024 1002   HCT 36.1 (L) 02/25/2024 1002   PLT 180 02/25/2024 1002   MCV 95.3 02/25/2024 1002  MCH 29.3 02/25/2024 1002   MCHC 30.7 02/25/2024 1002   RDW 16.5 (H) 02/25/2024 1002   LYMPHSABS PENDING 02/25/2024 1002   MONOABS PENDING 02/25/2024 1002   EOSABS PENDING 02/25/2024 1002   BASOSABS PENDING 02/25/2024 1002    CMP      Component Value Date/Time   NA 135 02/25/2024 1002   K 3.7 02/25/2024 1002   CL 102 02/25/2024 1002   CO2 24 02/25/2024 1002   GLUCOSE 99 02/25/2024 1002   BUN 8 02/25/2024 1002   CREATININE 1.02 02/25/2024 1002   CALCIUM 9.1 02/25/2024 1002   PROT 7.2 02/25/2024 1002   ALBUMIN 3.6 02/25/2024 1002   AST 23 02/25/2024 1002   ALT 11 02/25/2024 1002   ALKPHOS 96 02/25/2024 1002   BILITOT 0.3 02/25/2024 1002   GFRNONAA >60 02/25/2024 1002     Lab Results  Component Value Date   CEA1 181.0 (H) 02/11/2024   /  CEA  Date Value Ref Range Status  02/11/2024 181.0 (H) 0.0 - 4.7 ng/mL Final    Comment:    (NOTE)                             Nonsmokers          <3.9                              Smokers             <5.6 Roche [employer]ostics Electrochemiluminescence Immunoassay (ECLIA) Values obtained with different assay methods or kits cannot be used interchangeably.  Results cannot be interpreted as absolute evidence of the presence or absence of malignant disease. Performed At: [facility] [address] [place], [place] [id number] Jolene Schimke MD AO:[id number]    No results found for: "PSA1" No results found for: "CAN199" No results found for: "CAN125"  No results found for: "TOTALPROTELP", "ALBUMINELP", "A1GS", "A2GS", "BETS", "BETA2SER", "GAMS", "MSPIKE", "SPEI" Lab Results  Component Value Date   TIBC 406 01/16/2023   TIBC 294 06/06/2022   TIBC 237 (L) 02/22/2022   FERRITIN 150 01/16/2023   FERRITIN 243 06/06/2022   FERRITIN 292 02/22/2022   IRONPCTSAT 18 01/16/2023   IRONPCTSAT 24 06/06/2022   IRONPCTSAT 11 (L) 02/22/2022   Lab Results  Component Value Date   LDH 258 (H) 12/26/2021     STUDIES:   No results found.

## 2024-02-25 ENCOUNTER — Inpatient Hospital Stay (HOSPITAL_BASED_OUTPATIENT_CLINIC_OR_DEPARTMENT_OTHER): Payer: Medicare Other | Admitting: Hematology

## 2024-02-25 ENCOUNTER — Inpatient Hospital Stay: Payer: Medicare Other

## 2024-02-25 ENCOUNTER — Other Ambulatory Visit: Payer: Self-pay | Admitting: *Deleted

## 2024-02-25 VITALS — BP 108/81 | HR 89 | Temp 98.3°F | Resp 16 | Wt 185.8 lb

## 2024-02-25 VITALS — BP 112/77 | HR 62 | Temp 97.9°F | Resp 16

## 2024-02-25 DIAGNOSIS — Z95828 Presence of other vascular implants and grafts: Secondary | ICD-10-CM

## 2024-02-25 DIAGNOSIS — C7972 Secondary malignant neoplasm of left adrenal gland: Secondary | ICD-10-CM | POA: Diagnosis not present

## 2024-02-25 DIAGNOSIS — C180 Malignant neoplasm of cecum: Secondary | ICD-10-CM

## 2024-02-25 DIAGNOSIS — Z5189 Encounter for other specified aftercare: Secondary | ICD-10-CM | POA: Diagnosis not present

## 2024-02-25 DIAGNOSIS — Z87891 Personal history of nicotine dependence: Secondary | ICD-10-CM | POA: Diagnosis not present

## 2024-02-25 DIAGNOSIS — Z5112 Encounter for antineoplastic immunotherapy: Secondary | ICD-10-CM | POA: Diagnosis not present

## 2024-02-25 DIAGNOSIS — C7801 Secondary malignant neoplasm of right lung: Secondary | ICD-10-CM | POA: Diagnosis not present

## 2024-02-25 DIAGNOSIS — Z5111 Encounter for antineoplastic chemotherapy: Secondary | ICD-10-CM | POA: Diagnosis not present

## 2024-02-25 DIAGNOSIS — C7802 Secondary malignant neoplasm of left lung: Secondary | ICD-10-CM | POA: Diagnosis not present

## 2024-02-25 LAB — COMPREHENSIVE METABOLIC PANEL
ALT: 11 U/L (ref 0–44)
AST: 23 U/L (ref 15–41)
Albumin: 3.6 g/dL (ref 3.5–5.0)
Alkaline Phosphatase: 96 U/L (ref 38–126)
Anion gap: 9 (ref 5–15)
BUN: 8 mg/dL (ref 8–23)
CO2: 24 mmol/L (ref 22–32)
Calcium: 9.1 mg/dL (ref 8.9–10.3)
Chloride: 102 mmol/L (ref 98–111)
Creatinine, Ser: 1.02 mg/dL (ref 0.61–1.24)
GFR, Estimated: >60 mL/min (ref >60)
Glucose, Bld: 99 mg/dL (ref 70–99)
Potassium: 3.7 mmol/L (ref 3.5–5.1)
Sodium: 135 mmol/L (ref 135–145)
Total Bilirubin: 0.3 mg/dL (ref 0.0–1.2)
Total Protein: 7.2 g/dL (ref 6.5–8.1)

## 2024-02-25 LAB — CBC WITH DIFFERENTIAL/PLATELET
Abs Immature Granulocytes: 0.5 10*3/uL — ABNORMAL HIGH (ref 0.00–0.07)
Band Neutrophils: 3 %
Basophils Absolute: 0 10*3/uL (ref 0.0–0.1)
Basophils Relative: 0 %
Eosinophils Absolute: 0 10*3/uL (ref 0.0–0.5)
Eosinophils Relative: 0 %
HCT: 36.1 % — ABNORMAL LOW (ref 39.0–52.0)
Hemoglobin: 11.1 g/dL — ABNORMAL LOW (ref 13.0–17.0)
Lymphocytes Relative: 25 %
Lymphs Abs: 4.2 10*3/uL — ABNORMAL HIGH (ref 0.7–4.0)
MCH: 29.3 pg (ref 26.0–34.0)
MCHC: 30.7 g/dL (ref 30.0–36.0)
MCV: 95.3 fL (ref 80.0–100.0)
Metamyelocytes Relative: 1 %
Monocytes Absolute: 1 10*3/uL (ref 0.1–1.0)
Monocytes Relative: 6 %
Myelocytes: 2 %
Neutro Abs: 11 10*3/uL — ABNORMAL HIGH (ref 1.7–7.7)
Neutrophils Relative %: 63 %
Platelets: 180 10*3/uL (ref 150–400)
RBC: 3.79 MIL/uL — ABNORMAL LOW (ref 4.22–5.81)
RDW: 16.5 % — ABNORMAL HIGH (ref 11.5–15.5)
WBC: 16.6 10*3/uL — ABNORMAL HIGH (ref 4.0–10.5)
nRBC: 0.3 % — ABNORMAL HIGH (ref 0.0–0.2)

## 2024-02-25 LAB — MAGNESIUM: Magnesium: 1.9 mg/dL (ref 1.7–2.4)

## 2024-02-25 MED ORDER — OXYCODONE HCL 10 MG PO TABS: 10.0000 mg | ORAL_TABLET | ORAL | 0 refills | Status: DC | PRN

## 2024-02-25 MED ORDER — SODIUM CHLORIDE 0.9 % IV SOLN
1920.0000 mg/m2 | INTRAVENOUS | Status: DC
  Administered 2024-02-25: 3500 mg via INTRAVENOUS
  Filled 2024-02-25: qty 70

## 2024-02-25 MED ORDER — HEPARIN SOD (PORK) LOCK FLUSH 100 UNIT/ML IV SOLN: 500.0000 [IU] | Freq: Once | INTRAVENOUS | Status: DC | PRN

## 2024-02-25 MED ORDER — SODIUM CHLORIDE 0.9 % IV SOLN
400.0000 mg/m2 | Freq: Once | INTRAVENOUS | Status: AC
  Administered 2024-02-25: 780 mg via INTRAVENOUS
  Filled 2024-02-25: qty 39

## 2024-02-25 MED ORDER — SODIUM CHLORIDE 0.9% FLUSH
10.0000 mL | INTRAVENOUS | Status: DC | PRN
  Administered 2024-02-25: 10 mL via INTRAVENOUS

## 2024-02-25 MED ORDER — PALONOSETRON HCL INJECTION 0.25 MG/5ML
0.2500 mg | Freq: Once | INTRAVENOUS | Status: AC
  Administered 2024-02-25: 0.25 mg via INTRAVENOUS
  Filled 2024-02-25: qty 5

## 2024-02-25 MED ORDER — SODIUM CHLORIDE 0.9 % IV SOLN
144.0000 mg/m2 | Freq: Once | INTRAVENOUS | Status: AC
  Administered 2024-02-25: 300 mg via INTRAVENOUS
  Filled 2024-02-25: qty 15

## 2024-02-25 MED ORDER — ATROPINE SULFATE 1 MG/ML IV SOLN
1.0000 mg | Freq: Once | INTRAVENOUS | Status: AC
Start: 2024-02-25 — End: 2024-02-25
  Administered 2024-02-25: 1 mg via INTRAVENOUS
  Filled 2024-02-25: qty 1

## 2024-02-25 MED ORDER — FLUOROURACIL CHEMO INJECTION 2.5 GM/50ML
320.0000 mg/m2 | Freq: Once | INTRAVENOUS | Status: AC
  Administered 2024-02-25: 600 mg via INTRAVENOUS
  Filled 2024-02-25: qty 12

## 2024-02-25 MED ORDER — SODIUM CHLORIDE 0.9 % IV SOLN
150.0000 mg | Freq: Once | INTRAVENOUS | Status: AC
  Administered 2024-02-25: 150 mg via INTRAVENOUS
  Filled 2024-02-25: qty 150

## 2024-02-25 MED ORDER — DEXAMETHASONE SODIUM PHOSPHATE 10 MG/ML IJ SOLN
10.0000 mg | Freq: Once | INTRAMUSCULAR | Status: AC
  Administered 2024-02-25: 10 mg via INTRAVENOUS
  Filled 2024-02-25: qty 1

## 2024-02-25 MED ORDER — SODIUM CHLORIDE 0.9 % IV SOLN
5.0000 mg/kg | Freq: Once | INTRAVENOUS | Status: AC
  Administered 2024-02-25: 400 mg via INTRAVENOUS
  Filled 2024-02-25: qty 16

## 2024-02-25 MED ORDER — SODIUM CHLORIDE 0.9 % IV SOLN: Freq: Once | INTRAVENOUS | Status: AC

## 2024-02-25 MED ORDER — SODIUM CHLORIDE 0.9% FLUSH: 10.0000 mL | INTRAVENOUS | Status: DC | PRN

## 2024-02-25 NOTE — Progress Notes (Signed)
 Patient has been examined by Dr. Ellin Saba. Vital signs and labs have been reviewed by MD - ANC, Creatinine, LFTs, hemoglobin, and platelets are within treatment parameters per M.D. - pt may proceed with treatment.  Primary RN and pharmacy notified.

## 2024-02-25 NOTE — Patient Instructions (Signed)

## 2024-02-25 NOTE — Patient Instructions (Signed)
 [facility] [place] - A DEPT OF [facility]  Discharge Instructions: Thank you for choosing [facility] to provide your oncology and hematology care.  If you have a lab appointment with the Cancer Center - please note that after April 8th, 2024, all labs will be drawn in the cancer center.  You do not have to check in or register with the main entrance as you have in the past but will complete your check-in in the cancer center.  Wear comfortable clothing and clothing appropriate for easy access to any Portacath or PICC line.   We strive to give you quality time with your provider. You may need to reschedule your appointment if you arrive late (15 or more minutes).  Arriving late affects you and other patients whose appointments are after yours.  Also, if you miss three or more appointments without notifying the office, you may be dismissed from the clinic at the provider's discretion.      For prescription refill requests, have your pharmacy contact our office and allow 72 hours for refills to be completed.    Today you received the following chemotherapy and/or immunotherapy agents Folfiri    To help prevent nausea and vomiting after your treatment, we encourage you to take your nausea medication as directed.  BELOW ARE SYMPTOMS THAT SHOULD BE REPORTED IMMEDIATELY: *FEVER GREATER THAN 100.4 F (38 C) OR HIGHER *CHILLS OR SWEATING *NAUSEA AND VOMITING THAT IS NOT CONTROLLED WITH YOUR NAUSEA MEDICATION *UNUSUAL SHORTNESS OF BREATH *UNUSUAL BRUISING OR BLEEDING *URINARY PROBLEMS (pain or burning when urinating, or frequent urination) *BOWEL PROBLEMS (unusual diarrhea, constipation, pain near the anus) TENDERNESS IN MOUTH AND THROAT WITH OR WITHOUT PRESENCE OF ULCERS (sore throat, sores in mouth, or a toothache) UNUSUAL RASH, SWELLING OR PAIN  UNUSUAL VAGINAL DISCHARGE OR ITCHING   Items with * indicate a potential emergency and should be followed up as  soon as possible or go to the Emergency Department if any problems should occur.  Please show the CHEMOTHERAPY ALERT CARD or IMMUNOTHERAPY ALERT CARD at check-in to the Emergency Department and triage nurse.  Should you have questions after your visit or need to cancel or reschedule your appointment, please contact [facility] [place] - A DEPT OF Eligh[facility] Brideg[facility] [phone]  and follow the prompts.  Office hours are 8:00 a.m. to 4:30 p.m. Monday - Friday. Please note that voicemails left after 4:00 p.m. may not be returned until the following business day.  We are closed weekends and major holidays. You have access to a nurse at all times for urgent questions. Please call the main number to the clinic [phone] and follow the prompts.  For any non-urgent questions, you may also contact your provider using MyChart. We now offer e-Visits for anyone 90 and older to request care online for non-urgent symptoms. For details visit [email or web address].   Also download the MyChart app! Go to the app store, search "MyChart", open the app, select [place], and log in with your MyChart username and password.

## 2024-02-25 NOTE — Progress Notes (Signed)
 Patient presents today for [facility] with 5FU pump start per providers order.  Vital signs and labs reviewed by MD.  Message received from Chapman Moss RN/Dr. Ellin Saba patient okay for treatment.  Treatment given today per MD orders.  Stable during infusion without adverse affects.  Vital signs stable.  5FU pump connected and verified RUN on the screen with the patient.  No complaints at this time.  Discharge from clinic ambulatory in stable condition.  Alert and oriented X 3.  Follow up with [facility] as scheduled.

## 2024-02-26 LAB — CEA: CEA: 124 ng/mL — ABNORMAL HIGH (ref 0.0–4.7)

## 2024-02-27 ENCOUNTER — Inpatient Hospital Stay: Payer: Medicare Other

## 2024-02-27 ENCOUNTER — Other Ambulatory Visit: Payer: Self-pay

## 2024-02-27 VITALS — BP 116/84 | HR 85 | Resp 18

## 2024-02-27 DIAGNOSIS — C180 Malignant neoplasm of cecum: Secondary | ICD-10-CM | POA: Diagnosis not present

## 2024-02-27 DIAGNOSIS — C7972 Secondary malignant neoplasm of left adrenal gland: Secondary | ICD-10-CM | POA: Diagnosis not present

## 2024-02-27 DIAGNOSIS — Z87891 Personal history of nicotine dependence: Secondary | ICD-10-CM | POA: Diagnosis not present

## 2024-02-27 DIAGNOSIS — Z5111 Encounter for antineoplastic chemotherapy: Secondary | ICD-10-CM | POA: Diagnosis not present

## 2024-02-27 DIAGNOSIS — Z5189 Encounter for other specified aftercare: Secondary | ICD-10-CM | POA: Diagnosis not present

## 2024-02-27 DIAGNOSIS — C7802 Secondary malignant neoplasm of left lung: Secondary | ICD-10-CM | POA: Diagnosis not present

## 2024-02-27 DIAGNOSIS — Z95828 Presence of other vascular implants and grafts: Secondary | ICD-10-CM

## 2024-02-27 DIAGNOSIS — Z5112 Encounter for antineoplastic immunotherapy: Secondary | ICD-10-CM | POA: Diagnosis not present

## 2024-02-27 DIAGNOSIS — C7801 Secondary malignant neoplasm of right lung: Secondary | ICD-10-CM | POA: Diagnosis not present

## 2024-02-27 MED ORDER — HEPARIN SOD (PORK) LOCK FLUSH 100 UNIT/ML IV SOLN
500.0000 [IU] | Freq: Once | INTRAVENOUS | Status: AC | PRN
  Administered 2024-02-27: 500 [IU]

## 2024-02-27 MED ORDER — SODIUM CHLORIDE 0.9% FLUSH
10.0000 mL | INTRAVENOUS | Status: DC | PRN
Start: 2024-02-27 — End: 2024-02-27
  Administered 2024-02-27: 10 mL

## 2024-02-27 MED ORDER — PEGFILGRASTIM-CBQV 6 MG/0.6ML ~~LOC~~ SOSY
6.0000 mg | PREFILLED_SYRINGE | Freq: Once | SUBCUTANEOUS | Status: AC
  Administered 2024-02-27: 6 mg via SUBCUTANEOUS
  Filled 2024-02-27: qty 0.6

## 2024-02-27 NOTE — Progress Notes (Signed)
 Patient presents today for pump d/c. Vital signs are stable. Port a cath site clean, dry, and intact. Port flushed with 10 mls of Normal Saline and 500 Units of Heparin. Needle removed intact. Band aid applied. Patient has no complaints at this time.  Patient tolerated Udencya injection with no complaints voiced.  Site clean and dry with no bruising or swelling noted at site.  See MAR for details.  Band aid applied.  Patient stable during and after injection.  Vss with discharge and left in satisfactory condition with no s/s of distress noted. All follow ups as scheduled.   Ramsey Midgett [employer]

## 2024-03-07 ENCOUNTER — Other Ambulatory Visit: Payer: Self-pay

## 2024-03-10 ENCOUNTER — Inpatient Hospital Stay: Payer: Medicare Other

## 2024-03-10 ENCOUNTER — Inpatient Hospital Stay: Payer: Medicare Other | Admitting: Hematology

## 2024-03-10 ENCOUNTER — Emergency Department: Admit: 2024-03-10 | Discharge: 2024-03-10 | Disposition: A | Discharge status: Home / Self Care

## 2024-03-10 VITALS — BP 119/83 | HR 59 | Temp 97.0°F | Resp 18

## 2024-03-10 VITALS — BP 134/87 | HR 79 | Temp 97.8°F | Resp 18 | Wt 187.6 lb

## 2024-03-10 DIAGNOSIS — Z95828 Presence of other vascular implants and grafts: Secondary | ICD-10-CM

## 2024-03-10 DIAGNOSIS — Z87891 Personal history of nicotine dependence: Secondary | ICD-10-CM | POA: Diagnosis not present

## 2024-03-10 DIAGNOSIS — Z5112 Encounter for antineoplastic immunotherapy: Secondary | ICD-10-CM | POA: Diagnosis not present

## 2024-03-10 DIAGNOSIS — Z452 Encounter for adjustment and management of vascular access device: Secondary | ICD-10-CM | POA: Diagnosis not present

## 2024-03-10 DIAGNOSIS — C189 Malignant neoplasm of colon, unspecified: Secondary | ICD-10-CM

## 2024-03-10 DIAGNOSIS — C180 Malignant neoplasm of cecum: Secondary | ICD-10-CM | POA: Diagnosis not present

## 2024-03-10 DIAGNOSIS — F17210 Nicotine dependence, cigarettes, uncomplicated: Secondary | ICD-10-CM | POA: Diagnosis not present

## 2024-03-10 DIAGNOSIS — C7802 Secondary malignant neoplasm of left lung: Secondary | ICD-10-CM | POA: Diagnosis not present

## 2024-03-10 DIAGNOSIS — C7972 Secondary malignant neoplasm of left adrenal gland: Secondary | ICD-10-CM | POA: Diagnosis not present

## 2024-03-10 DIAGNOSIS — Z5111 Encounter for antineoplastic chemotherapy: Secondary | ICD-10-CM | POA: Diagnosis not present

## 2024-03-10 DIAGNOSIS — R197 Diarrhea, unspecified: Secondary | ICD-10-CM

## 2024-03-10 DIAGNOSIS — Z5189 Encounter for other specified aftercare: Secondary | ICD-10-CM | POA: Diagnosis not present

## 2024-03-10 DIAGNOSIS — K5900 Constipation, unspecified: Secondary | ICD-10-CM

## 2024-03-10 DIAGNOSIS — T82524A Displacement of infusion catheter, initial encounter: Secondary | ICD-10-CM | POA: Diagnosis not present

## 2024-03-10 DIAGNOSIS — C7801 Secondary malignant neoplasm of right lung: Secondary | ICD-10-CM | POA: Diagnosis not present

## 2024-03-10 LAB — CBC WITH DIFFERENTIAL/PLATELET
Abs Immature Granulocytes: 0.5 10*3/uL — ABNORMAL HIGH (ref 0.00–0.07)
Band Neutrophils: 3 %
Basophils Absolute: 0 10*3/uL (ref 0.0–0.1)
Basophils Relative: 0 %
Eosinophils Absolute: 0.4 10*3/uL (ref 0.0–0.5)
Eosinophils Relative: 3 %
HCT: 34.7 % — ABNORMAL LOW (ref 39.0–52.0)
Hemoglobin: 11 g/dL — ABNORMAL LOW (ref 13.0–17.0)
Lymphocytes Relative: 25 %
Lymphs Abs: 3 10*3/uL (ref 0.7–4.0)
MCH: 29.7 pg (ref 26.0–34.0)
MCHC: 31.7 g/dL (ref 30.0–36.0)
MCV: 93.8 fL (ref 80.0–100.0)
Metamyelocytes Relative: 2 %
Monocytes Absolute: 0.7 10*3/uL (ref 0.1–1.0)
Monocytes Relative: 6 %
Myelocytes: 2 %
Neutro Abs: 7.4 10*3/uL (ref 1.7–7.7)
Neutrophils Relative %: 59 %
Platelets: 178 10*3/uL (ref 150–400)
RBC: 3.7 MIL/uL — ABNORMAL LOW (ref 4.22–5.81)
RDW: 17.1 % — ABNORMAL HIGH (ref 11.5–15.5)
WBC: 11.9 10*3/uL — ABNORMAL HIGH (ref 4.0–10.5)
nRBC: 0.2 % (ref 0.0–0.2)

## 2024-03-10 LAB — COMPREHENSIVE METABOLIC PANEL WITH GFR
ALT: 10 U/L (ref 0–44)
AST: 20 U/L (ref 15–41)
Albumin: 3.3 g/dL — ABNORMAL LOW (ref 3.5–5.0)
Alkaline Phosphatase: 83 U/L (ref 38–126)
Anion gap: 11 (ref 5–15)
BUN: 5 mg/dL — ABNORMAL LOW (ref 8–23)
CO2: 22 mmol/L (ref 22–32)
Calcium: 8.6 mg/dL — ABNORMAL LOW (ref 8.9–10.3)
Chloride: 106 mmol/L (ref 98–111)
Creatinine, Ser: 0.91 mg/dL (ref 0.61–1.24)
GFR, Estimated: >60 mL/min (ref >60)
Glucose, Bld: 106 mg/dL — ABNORMAL HIGH (ref 70–99)
Potassium: 3.4 mmol/L — ABNORMAL LOW (ref 3.5–5.1)
Sodium: 139 mmol/L (ref 135–145)
Total Bilirubin: 0.5 mg/dL (ref 0.0–1.2)
Total Protein: 6.8 g/dL (ref 6.5–8.1)

## 2024-03-10 LAB — URINALYSIS, DIPSTICK ONLY
Bilirubin Urine: NEGATIVE
Glucose, UA: NEGATIVE mg/dL
Hgb urine dipstick: NEGATIVE
Ketones, ur: NEGATIVE mg/dL
Leukocytes,Ua: NEGATIVE
Nitrite: NEGATIVE
Protein, ur: 30 mg/dL — AB
Specific Gravity, Urine: 1.013 (ref 1.005–1.030)
pH: 5 (ref 5.0–8.0)

## 2024-03-10 LAB — MAGNESIUM: Magnesium: 1.8 mg/dL (ref 1.7–2.4)

## 2024-03-10 MED ORDER — DEXAMETHASONE SODIUM PHOSPHATE 10 MG/ML IJ SOLN
10.0000 mg | Freq: Once | INTRAMUSCULAR | Status: AC
  Administered 2024-03-10: 10 mg via INTRAVENOUS
  Filled 2024-03-10: qty 1

## 2024-03-10 MED ORDER — SODIUM CHLORIDE 0.9% FLUSH
10.0000 mL | INTRAVENOUS | Status: DC | PRN
  Administered 2024-03-10: 10 mL via INTRAVENOUS

## 2024-03-10 MED ORDER — FLUOROURACIL CHEMO INJECTION 2.5 GM/50ML
320.0000 mg/m2 | Freq: Once | INTRAVENOUS | Status: AC
Start: 2024-03-10 — End: 2024-03-10
  Administered 2024-03-10: 600 mg via INTRAVENOUS
  Filled 2024-03-10: qty 12

## 2024-03-10 MED ORDER — POTASSIUM CHLORIDE CRYS ER 20 MEQ PO TBCR: 20.0000 meq | EXTENDED_RELEASE_TABLET | Freq: Every day | ORAL | 4 refills | Status: DC

## 2024-03-10 MED ORDER — POTASSIUM CHLORIDE CRYS ER 20 MEQ PO TBCR
40.0000 meq | EXTENDED_RELEASE_TABLET | Freq: Once | ORAL | Status: AC
  Administered 2024-03-10: 40 meq via ORAL
  Filled 2024-03-10: qty 2

## 2024-03-10 MED ORDER — ALUMINUM-MAGNESIUM-SIMETHICONE 200-200-20 MG/5ML PO SUSP: 30.0000 mL | Freq: Three times a day (TID) | ORAL | 2 refills | Status: AC

## 2024-03-10 MED ORDER — SODIUM CHLORIDE 0.9 % IV SOLN
144.0000 mg/m2 | Freq: Once | INTRAVENOUS | Status: AC
  Administered 2024-03-10: 300 mg via INTRAVENOUS
  Filled 2024-03-10: qty 15

## 2024-03-10 MED ORDER — SODIUM CHLORIDE 0.9 % IV SOLN
150.0000 mg | Freq: Once | INTRAVENOUS | Status: AC
  Administered 2024-03-10: 150 mg via INTRAVENOUS
  Filled 2024-03-10: qty 150

## 2024-03-10 MED ORDER — PALONOSETRON HCL INJECTION 0.25 MG/5ML
0.2500 mg | Freq: Once | INTRAVENOUS | Status: AC
  Administered 2024-03-10: 0.25 mg via INTRAVENOUS
  Filled 2024-03-10: qty 5

## 2024-03-10 MED ORDER — LACTULOSE 20 GM/30ML PO SOLN: 10.0000 g | Freq: Every day | ORAL | 5 refills | Status: DC

## 2024-03-10 MED ORDER — SODIUM CHLORIDE 0.9 % IV SOLN
5.0000 mg/kg | Freq: Once | INTRAVENOUS | Status: AC
  Administered 2024-03-10: 400 mg via INTRAVENOUS
  Filled 2024-03-10: qty 16

## 2024-03-10 MED ORDER — SODIUM CHLORIDE 0.9 % IV SOLN
1920.0000 mg/m2 | INTRAVENOUS | Status: DC
  Administered 2024-03-10: 3500 mg via INTRAVENOUS
  Filled 2024-03-10: qty 70

## 2024-03-10 MED ORDER — ATROPINE SULFATE 1 MG/ML IV SOLN
1.0000 mg | Freq: Once | INTRAVENOUS | Status: AC | PRN
  Administered 2024-03-10: 1 mg via INTRAVENOUS
  Filled 2024-03-10: qty 1

## 2024-03-10 MED ORDER — SODIUM CHLORIDE 0.9 % IV SOLN
Freq: Once | INTRAVENOUS | Status: AC
Start: 2024-03-10 — End: 2024-03-10

## 2024-03-10 MED ORDER — LOPERAMIDE HCL 2 MG PO CAPS: 2.0000 mg | ORAL_CAPSULE | ORAL | 0 refills | Status: DC | PRN

## 2024-03-10 MED ORDER — SODIUM CHLORIDE 0.9 % IV SOLN
400.0000 mg/m2 | Freq: Once | INTRAVENOUS | Status: AC
  Administered 2024-03-10: 780 mg via INTRAVENOUS
  Filled 2024-03-10: qty 39

## 2024-03-10 NOTE — Progress Notes (Signed)
 Patient presents today for MVASI +Folfiri . Vital signs and lab work within parameters for treatment. Potassium 3.4. Standing orders followed. Patient will receive 40 mEq of Potassium PO. Urine protein today 30.

## 2024-03-10 NOTE — Progress Notes (Signed)
 Patients port flushed without difficulty.  Good blood return noted with no bruising or swelling noted at site.  Patient remains accessed for treatment.

## 2024-03-10 NOTE — Patient Instructions (Signed)
 [facility] [place] - A DEPT OF [facility]  Discharge Instructions: Thank you for choosing [facility] to provide your oncology and hematology care.  If you have a lab appointment with the Cancer Center - please note that after April 8th, 2024, all labs will be drawn in the cancer center.  You do not have to check in or register with the main entrance as you have in the past but will complete your check-in in the cancer center.  Wear comfortable clothing and clothing appropriate for easy access to any Portacath or PICC line.   We strive to give you quality time with your provider. You may need to reschedule your appointment if you arrive late (15 or more minutes).  Arriving late affects you and other patients whose appointments are after yours.  Also, if you miss three or more appointments without notifying the office, you may be dismissed from the clinic at the provider's discretion.      For prescription refill requests, have your pharmacy contact our office and allow 72 hours for refills to be completed.    Today you received the following chemotherapy and/or immunotherapy agents iriinotecan, leucovorin, adruicil       To help prevent nausea and vomiting after your treatment, we encourage you to take your nausea medication as directed.  BELOW ARE SYMPTOMS THAT SHOULD BE REPORTED IMMEDIATELY: *FEVER GREATER THAN 100.4 F (38 C) OR HIGHER *CHILLS OR SWEATING *NAUSEA AND VOMITING THAT IS NOT CONTROLLED WITH YOUR NAUSEA MEDICATION *UNUSUAL SHORTNESS OF BREATH *UNUSUAL BRUISING OR BLEEDING *URINARY PROBLEMS (pain or burning when urinating, or frequent urination) *BOWEL PROBLEMS (unusual diarrhea, constipation, pain near the anus) TENDERNESS IN MOUTH AND THROAT WITH OR WITHOUT PRESENCE OF ULCERS (sore throat, sores in mouth, or a toothache) UNUSUAL RASH, SWELLING OR PAIN  UNUSUAL VAGINAL DISCHARGE OR ITCHING   Items with * indicate a potential emergency  and should be followed up as soon as possible or go to the Emergency Department if any problems should occur.  Please show the CHEMOTHERAPY ALERT CARD or IMMUNOTHERAPY ALERT CARD at check-in to the Emergency Department and triage nurse.  Should you have questions after your visit or need to cancel or reschedule your appointment, please contact [facility] [place] - A DEPT OF [facility] [phone]  and follow the prompts.  Office hours are 8:00 a.m. to 4:30 p.m. Monday - Friday. Please note that voicemails left after 4:00 p.m. may not be returned until the following business day.  We are closed weekends and major holidays. You have access to a nurse at all times for urgent questions. Please call the main number to the clinic [phone] and follow the prompts.  For any non-urgent questions, you may also contact your provider using MyChart. We now offer e-Visits for anyone 66 and older to request care online for non-urgent symptoms. For details visit [email or web address].   Also download the MyChart app! Go to the app store, search "MyChart", open the app, select , and log in with your MyChart username and password.

## 2024-03-10 NOTE — Progress Notes (Signed)
 Patient tolerated chemotherapy with no complaints voiced.  Side effects with management reviewed with understanding verbalized.  Port site clean and dry with no bruising or swelling noted at site.  Good blood return noted before and after administration of chemotherapy.  Chemo pump connected with no alarms noted.  Patient left in satisfactory condition with VSS and no s/s of distress noted.

## 2024-03-11 LAB — CEA: CEA: 149 ng/mL — ABNORMAL HIGH (ref 0.0–4.7)

## 2024-03-12 ENCOUNTER — Inpatient Hospital Stay: Payer: Medicare Other | Attending: Hematology

## 2024-03-12 VITALS — BP 129/90 | HR 62 | Temp 97.3°F | Resp 19

## 2024-03-12 DIAGNOSIS — Z95828 Presence of other vascular implants and grafts: Secondary | ICD-10-CM

## 2024-03-12 DIAGNOSIS — C7972 Secondary malignant neoplasm of left adrenal gland: Secondary | ICD-10-CM | POA: Insufficient documentation

## 2024-03-12 DIAGNOSIS — C7801 Secondary malignant neoplasm of right lung: Secondary | ICD-10-CM | POA: Diagnosis not present

## 2024-03-12 DIAGNOSIS — C7802 Secondary malignant neoplasm of left lung: Secondary | ICD-10-CM | POA: Insufficient documentation

## 2024-03-12 DIAGNOSIS — Z5111 Encounter for antineoplastic chemotherapy: Secondary | ICD-10-CM | POA: Insufficient documentation

## 2024-03-12 DIAGNOSIS — C180 Malignant neoplasm of cecum: Secondary | ICD-10-CM | POA: Insufficient documentation

## 2024-03-12 DIAGNOSIS — Z5189 Encounter for other specified aftercare: Secondary | ICD-10-CM | POA: Diagnosis not present

## 2024-03-12 DIAGNOSIS — Z5112 Encounter for antineoplastic immunotherapy: Secondary | ICD-10-CM | POA: Diagnosis not present

## 2024-03-12 MED ORDER — HEPARIN SOD (PORK) LOCK FLUSH 100 UNIT/ML IV SOLN
500.0000 [IU] | Freq: Once | INTRAVENOUS | Status: AC | PRN
  Administered 2024-03-12: 500 [IU]

## 2024-03-12 MED ORDER — PEGFILGRASTIM-CBQV 6 MG/0.6ML ~~LOC~~ SOSY
6.0000 mg | PREFILLED_SYRINGE | Freq: Once | SUBCUTANEOUS | Status: AC
  Administered 2024-03-12: 6 mg via SUBCUTANEOUS
  Filled 2024-03-12: qty 0.6

## 2024-03-12 MED ORDER — SODIUM CHLORIDE 0.9% FLUSH
10.0000 mL | INTRAVENOUS | Status: DC | PRN
Start: 2024-03-12 — End: 2024-03-12
  Administered 2024-03-12: 10 mL

## 2024-03-12 NOTE — Patient Instructions (Signed)
 [facility] [place] [facility]  Discharge Instructions: Thank you for choosing [facility] to provide your oncology and hematology care.  If you have a lab appointment with the Cancer Center - please note that after April 8th, 2024, all labs will be drawn in the cancer center.  You do not have to check in or register with the main entrance as you have in the past but will complete your check-in in the cancer center.  Wear comfortable clothing and clothing appropriate for easy access to any Portacath or PICC line.   We strive to give you quality time with your provider. You may need to reschedule your appointment if you arrive late (15 or more minutes).  Arriving late affects you and other patients whose appointments are after yours.  Also, if you miss three or more appointments without notifying the office, you may be dismissed from the clinic at the provider's discretion.      For prescription refill requests, have your pharmacy contact our office and allow 72 hours for refills to be completed.    Today you received the following chemotherapy and/or immunotherapy agents adrucil. Fluorouracil Injection What is this medication? FLUOROURACIL (flure oh YOOR a sil) treats some types of cancer. It works by slowing down the growth of cancer cells. This medicine may be used for other purposes; ask your health care provider or pharmacist if you have questions. COMMON BRAND NAME(S): Adrucil What should I tell my care team before I take this medication? They need to know if you have any of these conditions: Blood disorders Dihydropyrimidine dehydrogenase (DPD) deficiency Infection, such as chickenpox, cold sores, herpes Kidney disease Liver disease Poor nutrition Recent or ongoing radiation therapy An unusual or allergic reaction to fluorouracil, other medications, foods, dyes, or preservatives If you or your partner are pregnant or trying to get  pregnant Breast-feeding How should I use this medication? This medication is injected into a vein. It is administered by your care team in a hospital or clinic setting. Talk to your care team about the use of this medication in children. Special care may be needed. Overdosage: If you think you have taken too much of this medicine contact a poison control center or emergency room at once. NOTE: This medicine is only for you. Do not share this medicine with others. What if I miss a dose? Keep appointments for follow-up doses. It is important not to miss your dose. Call your care team if you are unable to keep an appointment. What may interact with this medication? Do not take this medication with any of the following: Live virus vaccines This medication may also interact with the following: Medications that treat or prevent blood clots, such as warfarin, enoxaparin, dalteparin This list may not describe all possible interactions. Give your health care provider a list of all the medicines, herbs, non-prescription drugs, or dietary supplements you use. Also tell them if you smoke, drink alcohol, or use illegal drugs. Some items may interact with your medicine. What should I watch for while using this medication? Your condition will be monitored carefully while you are receiving this medication. This medication may make you feel generally unwell. This is not uncommon as chemotherapy can affect healthy cells as well as cancer cells. Report any side effects. Continue your course of treatment even though you feel ill unless your care team tells you to stop. In some cases, you may be given additional  medications to help with side effects. Follow all directions for their use. This medication may increase your risk of getting an infection. Call your care team for advice if you get a fever, chills, sore throat, or other symptoms of a cold or flu. Do not treat yourself. Try to avoid being around people who are  sick. This medication may increase your risk to bruise or bleed. Call your care team if you notice any unusual bleeding. Be careful brushing or flossing your teeth or using a toothpick because you may get an infection or bleed more easily. If you have any dental work done, tell your dentist you are receiving this medication. Avoid taking medications that contain aspirin, acetaminophen, ibuprofen, naproxen, or ketoprofen unless instructed by your care team. These medications may hide a fever. Do not treat diarrhea with over the counter products. Contact your care team if you have diarrhea that lasts more than 2 days or if it is severe and watery. This medication can make you more sensitive to the sun. Keep out of the sun. If you cannot avoid being in the sun, wear protective clothing and sunscreen. Do not use sun lamps, tanning beds, or tanning booths. Talk to your care team if you or your partner wish to become pregnant or think you might be pregnant. This medication can cause serious birth defects if taken during pregnancy and for 3 months after the last dose. A reliable form of contraception is recommended while taking this medication and for 3 months after the last dose. Talk to your care team about effective forms of contraception. Do not father a child while taking this medication and for 3 months after the last dose. Use a condom while having sex during this time period. Do not breastfeed while taking this medication. This medication may cause infertility. Talk to your care team if you are concerned about your fertility. What side effects may I notice from receiving this medication? Side effects that you should report to your care team as soon as possible: Allergic reactions--skin rash, itching, hives, swelling of the face, lips, tongue, or throat Heart attack--pain or tightness in the chest, shoulders, arms, or jaw, nausea, shortness of breath, cold or clammy skin, feeling faint or  lightheaded Heart failure--shortness of breath, swelling of the ankles, feet, or hands, sudden weight gain, unusual weakness or fatigue Heart rhythm changes--fast or irregular heartbeat, dizziness, feeling faint or lightheaded, chest pain, trouble breathing High ammonia level--unusual weakness or fatigue, confusion, loss of appetite, nausea, vomiting, seizures Infection--fever, chills, cough, sore throat, wounds that don't heal, pain or trouble when passing urine, general feeling of discomfort or being unwell Low red blood cell level--unusual weakness or fatigue, dizziness, headache, trouble breathing Pain, tingling, or numbness in the hands or feet, muscle weakness, change in vision, confusion or trouble speaking, loss of balance or coordination, trouble walking, seizures Redness, swelling, and blistering of the skin over hands and feet Severe or prolonged diarrhea Unusual bruising or bleeding Side effects that usually do not require medical attention (report to your care team if they continue or are bothersome): Dry skin Headache Increased tears Nausea Pain, redness, or swelling with sores inside the mouth or throat Sensitivity to light Vomiting This list may not describe all possible side effects. Call your doctor for medical advice about side effects. You may report side effects to FDA at 1-800-FDA-1088. Where should I keep my medication? This medication is given in a hospital or clinic. It will not be stored at home.  NOTE: This sheet is a summary. It may not cover all possible information. If you have questions about this medicine, talk to your doctor, pharmacist, or health care provider.  2024 Elsevier/Gold Standard (2022-04-04 00:00:00)      To help prevent nausea and vomiting after your treatment, we encourage you to take your nausea medication as directed.  BELOW ARE SYMPTOMS THAT SHOULD BE REPORTED IMMEDIATELY: *FEVER GREATER THAN 100.4 F (38 C) OR HIGHER *CHILLS OR  SWEATING *NAUSEA AND VOMITING THAT IS NOT CONTROLLED WITH YOUR NAUSEA MEDICATION *UNUSUAL SHORTNESS OF BREATH *UNUSUAL BRUISING OR BLEEDING *URINARY PROBLEMS (pain or burning when urinating, or frequent urination) *BOWEL PROBLEMS (unusual diarrhea, constipation, pain near the anus) TENDERNESS IN MOUTH AND THROAT WITH OR WITHOUT PRESENCE OF ULCERS (sore throat, sores in mouth, or a toothache) UNUSUAL RASH, SWELLING OR PAIN  UNUSUAL VAGINAL DISCHARGE OR ITCHING   Items with * indicate a potential emergency and should be followed up as soon as possible or go to the Emergency Department if any problems should occur.  Please show the CHEMOTHERAPY ALERT CARD or IMMUNOTHERAPY ALERT CARD at check-in to the Emergency Department and triage nurse.  Should you have questions after your visit or need to cancel or reschedule your appointment, please contact [facility] [place] - A DEPT OF [facility] [phone]  and follow the prompts.  Office hours are 8:00 a.m. to 4:30 p.m. Monday - Friday. Please note that voicemails left after 4:00 p.m. may not be returned until the following business day.  We are closed weekends and major holidays. You have access to a nurse at all times for urgent questions. Please call the main number to the clinic [phone] and follow the prompts.  For any non-urgent questions, you may also contact your provider using MyChart. We now offer e-Visits for anyone 29 and older to request care online for non-urgent symptoms. For details visit [email or web address].   Also download the MyChart app! Go to the app store, search "MyChart", open the app, select [place], and log in with your MyChart username and password.

## 2024-03-12 NOTE — Progress Notes (Signed)
Patient presents today for pump d/c. Vital signs are stable. Port a cath site clean, dry, and intact. Port flushed with 10 mls of Normal Saline and 500 Units of Heparin. Needle removed intact. Band aid applied. Patient has no complaints at this time. Discharged from clinic ambulatory and in stable condition. Patient alert and oriented.  

## 2024-03-24 ENCOUNTER — Inpatient Hospital Stay

## 2024-03-24 VITALS — BP 150/89 | HR 88 | Temp 98.0°F | Resp 18

## 2024-03-24 DIAGNOSIS — C180 Malignant neoplasm of cecum: Secondary | ICD-10-CM

## 2024-03-24 DIAGNOSIS — Z95828 Presence of other vascular implants and grafts: Secondary | ICD-10-CM

## 2024-03-24 DIAGNOSIS — Z5112 Encounter for antineoplastic immunotherapy: Secondary | ICD-10-CM | POA: Diagnosis not present

## 2024-03-24 DIAGNOSIS — C7972 Secondary malignant neoplasm of left adrenal gland: Secondary | ICD-10-CM | POA: Diagnosis not present

## 2024-03-24 DIAGNOSIS — Z5111 Encounter for antineoplastic chemotherapy: Secondary | ICD-10-CM | POA: Diagnosis not present

## 2024-03-24 DIAGNOSIS — Z5189 Encounter for other specified aftercare: Secondary | ICD-10-CM | POA: Diagnosis not present

## 2024-03-24 DIAGNOSIS — C7801 Secondary malignant neoplasm of right lung: Secondary | ICD-10-CM | POA: Diagnosis not present

## 2024-03-24 DIAGNOSIS — C7802 Secondary malignant neoplasm of left lung: Secondary | ICD-10-CM | POA: Diagnosis not present

## 2024-03-24 LAB — COMPREHENSIVE METABOLIC PANEL WITH GFR
ALT: 13 U/L (ref 0–44)
AST: 25 U/L (ref 15–41)
Albumin: 3.7 g/dL (ref 3.5–5.0)
Alkaline Phosphatase: 98 U/L (ref 38–126)
Anion gap: 11 (ref 5–15)
BUN: 15 mg/dL (ref 8–23)
CO2: 24 mmol/L (ref 22–32)
Calcium: 9.1 mg/dL (ref 8.9–10.3)
Chloride: 102 mmol/L (ref 98–111)
Creatinine, Ser: 1.11 mg/dL (ref 0.61–1.24)
GFR, Estimated: >60 mL/min (ref >60)
Glucose, Bld: 122 mg/dL — ABNORMAL HIGH (ref 70–99)
Potassium: 3.3 mmol/L — ABNORMAL LOW (ref 3.5–5.1)
Sodium: 137 mmol/L (ref 135–145)
Total Bilirubin: 0.4 mg/dL (ref 0.0–1.2)
Total Protein: 7.3 g/dL (ref 6.5–8.1)

## 2024-03-24 LAB — CBC WITH DIFFERENTIAL/PLATELET
Abs Immature Granulocytes: 0.2 10*3/uL — ABNORMAL HIGH (ref 0.00–0.07)
Basophils Absolute: 0.1 10*3/uL (ref 0.0–0.1)
Basophils Relative: 2 %
Eosinophils Absolute: 0.2 10*3/uL (ref 0.0–0.5)
Eosinophils Relative: 2 %
HCT: 36.1 % — ABNORMAL LOW (ref 39.0–52.0)
Hemoglobin: 11.6 g/dL — ABNORMAL LOW (ref 13.0–17.0)
Immature Granulocytes: 2 %
Lymphocytes Relative: 24 %
Lymphs Abs: 2.1 10*3/uL (ref 0.7–4.0)
MCH: 30.2 pg (ref 26.0–34.0)
MCHC: 32.1 g/dL (ref 30.0–36.0)
MCV: 94 fL (ref 80.0–100.0)
Monocytes Absolute: 1.2 10*3/uL — ABNORMAL HIGH (ref 0.1–1.0)
Monocytes Relative: 14 %
Neutro Abs: 4.8 10*3/uL (ref 1.7–7.7)
Neutrophils Relative %: 56 %
Platelets: 185 10*3/uL (ref 150–400)
RBC: 3.84 MIL/uL — ABNORMAL LOW (ref 4.22–5.81)
RDW: 17.2 % — ABNORMAL HIGH (ref 11.5–15.5)
WBC: 8.6 10*3/uL (ref 4.0–10.5)
nRBC: 0 % (ref 0.0–0.2)

## 2024-03-24 LAB — MAGNESIUM: Magnesium: 2 mg/dL (ref 1.7–2.4)

## 2024-03-24 MED ORDER — SODIUM CHLORIDE 0.9 % IV SOLN
144.0000 mg/m2 | Freq: Once | INTRAVENOUS | Status: AC
  Administered 2024-03-24: 300 mg via INTRAVENOUS
  Filled 2024-03-24: qty 15

## 2024-03-24 MED ORDER — SODIUM CHLORIDE 0.9 % IV SOLN
5.0000 mg/kg | Freq: Once | INTRAVENOUS | Status: AC
  Administered 2024-03-24: 400 mg via INTRAVENOUS
  Filled 2024-03-24: qty 16

## 2024-03-24 MED ORDER — SODIUM CHLORIDE 0.9 % IV SOLN: Freq: Once | INTRAVENOUS | Status: AC

## 2024-03-24 MED ORDER — ATROPINE SULFATE 1 MG/ML IV SOLN
1.0000 mg | Freq: Once | INTRAVENOUS | Status: AC
  Administered 2024-03-24: 1 mg via INTRAVENOUS
  Filled 2024-03-24: qty 1

## 2024-03-24 MED ORDER — SODIUM CHLORIDE 0.9 % IV SOLN
1920.0000 mg/m2 | INTRAVENOUS | Status: DC
  Administered 2024-03-24: 3500 mg via INTRAVENOUS
  Filled 2024-03-24: qty 70

## 2024-03-24 MED ORDER — PALONOSETRON HCL INJECTION 0.25 MG/5ML
0.2500 mg | Freq: Once | INTRAVENOUS | Status: AC
  Administered 2024-03-24: 0.25 mg via INTRAVENOUS
  Filled 2024-03-24: qty 5

## 2024-03-24 MED ORDER — DEXAMETHASONE SODIUM PHOSPHATE 10 MG/ML IJ SOLN
10.0000 mg | Freq: Once | INTRAMUSCULAR | Status: AC
  Administered 2024-03-24: 10 mg via INTRAVENOUS
  Filled 2024-03-24: qty 1

## 2024-03-24 MED ORDER — SODIUM CHLORIDE 0.9 % IV SOLN
400.0000 mg/m2 | Freq: Once | INTRAVENOUS | Status: AC
  Administered 2024-03-24: 780 mg via INTRAVENOUS
  Filled 2024-03-24: qty 39

## 2024-03-24 MED ORDER — SODIUM CHLORIDE 0.9 % IV SOLN
150.0000 mg | Freq: Once | INTRAVENOUS | Status: AC
  Administered 2024-03-24: 150 mg via INTRAVENOUS
  Filled 2024-03-24: qty 150

## 2024-03-24 MED ORDER — FLUOROURACIL CHEMO INJECTION 2.5 GM/50ML
320.0000 mg/m2 | Freq: Once | INTRAVENOUS | Status: AC
  Administered 2024-03-24: 600 mg via INTRAVENOUS
  Filled 2024-03-24: qty 12

## 2024-03-24 NOTE — Progress Notes (Signed)
 Patient presents today for MVASI/Folfiri infusion. Patient is in satisfactory condition with no new complaints voiced.  Vital signs are stable.  Labs reviewed and all labs are within treatment parameters. Patient's potassium noted to be 3.3 today. Patient stated he took two potassium chloride pills at home and did not want any additional potassium in the clinic today. We will proceed with treatment per MD orders.    Treatment given today per MD orders. Tolerated infusion without adverse affects. Vital signs stable. No complaints at this time. Discharged from clinic ambulatory in stable condition. Alert and oriented x 3. F/U with [facility] as scheduled.  5FU ambulatory pump infusing with no alarms beeping.

## 2024-03-24 NOTE — Patient Instructions (Signed)
 [facility] - A DEPT OF [facility]  Discharge Instructions: Thank you for choosing [facility] to provide your oncology and hematology care.  If you have a lab appointment with the Cancer Center - please note that after April 8th, 2024, all labs will be drawn in the cancer center.  You do not have to check in or register with the main entrance as you have in the past but will complete your check-in in the cancer center.  Wear comfortable clothing and clothing appropriate for easy access to any Portacath or PICC line.   We strive to give you quality time with your provider. You may need to reschedule your appointment if you arrive late (15 or more minutes).  Arriving late affects you and other patients whose appointments are after yours.  Also, if you miss three or more appointments without notifying the office, you may be dismissed from the clinic at the provider's discretion.      For prescription refill requests, have your pharmacy contact our office and allow 72 hours for refills to be completed.    Today you received the following chemotherapy and/or immunotherapy agents MVASI/Folfiri  Bevacizumab Injection What is this medication? BEVACIZUMAB (be va SIZ yoo mab) treats some types of cancer. It works by blocking a protein that causes cancer cells to grow and multiply. This helps to slow or stop the spread of cancer cells. It is a monoclonal antibody. This medicine may be used for other purposes; ask your health care provider or pharmacist if you have questions. COMMON BRAND NAME(S): Alymsys, Avastin, MVASI, Vegzalma, Zirabev What should I tell my care team before I take this medication? They need to know if you have any of these conditions: Blood clots Coughing up blood Having or recent surgery Heart failure High blood pressure History of a connection between 2 or more body parts that do not usually connect (fistula) History of a tear in  your stomach or intestines Protein in your urine An unusual or allergic reaction to bevacizumab, other medications, foods, dyes, or preservatives Pregnant or trying to get pregnant Breast-feeding How should I use this medication? This medication is injected into a vein. It is given by your care team in a hospital or clinic setting. Talk to your care team the use of this medication in children. Special care may be needed. Overdosage: If you think you have taken too much of this medicine contact a poison control center or emergency room at once. NOTE: This medicine is only for you. Do not share this medicine with others. What if I miss a dose? Keep appointments for follow-up doses. It is important not to miss your dose. Call your care team if you are unable to keep an appointment. What may interact with this medication? Interactions are not expected. This list may not describe all possible interactions. Give your health care provider a list of all the medicines, herbs, non-prescription drugs, or dietary supplements you use. Also tell them if you smoke, drink alcohol, or use illegal drugs. Some items may interact with your medicine. What should I watch for while using this medication? Your condition will be monitored carefully while you are receiving this medication. You may need blood work while taking this medication. This medication may make you feel generally unwell. This is not uncommon as chemotherapy can affect healthy cells as well as cancer cells. Report any side effects. Continue your course of treatment even though you feel  ill unless your care team tells you to stop. This medication may increase your risk to bruise or bleed. Call your care team if you notice any unusual bleeding. Before having surgery, talk to your care team to make sure it is ok. This medication can increase the risk of poor healing of your surgical site or wound. You will need to stop this medication for 28 days before  surgery. After surgery, wait at least 28 days before restarting this medication. Make sure the surgical site or wound is healed enough before restarting this medication. Talk to your care team if questions. Talk to your care team if you may be pregnant. Serious birth defects can occur if you take this medication during pregnancy and for 6 months after the last dose. Contraception is recommended while taking this medication and for 6 months after the last dose. Your care team can help you find the option that works for you. Do not breastfeed while taking this medication and for 6 months after the last dose. This medication can cause infertility. Talk to your care team if you are concerned about your fertility. What side effects may I notice from receiving this medication? Side effects that you should report to your care team as soon as possible: Allergic reactions--skin rash, itching, hives, swelling of the face, lips, tongue, or throat Bleeding--bloody or black, tar-like stools, vomiting blood or brown material that looks like coffee grounds, red or dark brown urine, small red or purple spots on skin, unusual bruising or bleeding Blood clot--pain, swelling, or warmth in the leg, shortness of breath, chest pain Heart attack--pain or tightness in the chest, shoulders, arms, or jaw, nausea, shortness of breath, cold or clammy skin, feeling faint or lightheaded Heart failure--shortness of breath, swelling of the ankles, feet, or hands, sudden weight gain, unusual weakness or fatigue Increase in blood pressure Infection--fever, chills, cough, sore throat, wounds that don't heal, pain or trouble when passing urine, general feeling of discomfort or being unwell Infusion reactions--chest pain, shortness of breath or trouble breathing, feeling faint or lightheaded Kidney injury--decrease in the amount of urine, swelling of the ankles, hands, or feet Stomach pain that is severe, does not go away, or gets  worse Stroke--sudden numbness or weakness of the face, arm, or leg, trouble speaking, confusion, trouble walking, loss of balance or coordination, dizziness, severe headache, change in vision Sudden and severe headache, confusion, change in vision, seizures, which may be signs of posterior reversible encephalopathy syndrome (PRES) Side effects that usually do not require medical attention (report to your care team if they continue or are bothersome): Back pain Change in taste Diarrhea Dry skin Increased tears Nosebleed This list may not describe all possible side effects. Call your doctor for medical advice about side effects. You may report side effects to FDA at 1-800-FDA-1088. Where should I keep my medication? This medication is given in a hospital or clinic. It will not be stored at home. NOTE: This sheet is a summary. It may not cover all possible information. If you have questions about this medicine, talk to your doctor, pharmacist, or health care provider.  2024 Elsevier/Gold Standard (2022-04-14 00:00:00)   To help prevent nausea and vomiting after your treatment, we encourage you to take your nausea medication as directed.  Irinotecan Injection What is this medication? IRINOTECAN (ir in oh TEE kan) treats some types of cancer. It works by slowing down the growth of cancer cells. This medicine may be used for other purposes; ask your  health care provider or pharmacist if you have questions. COMMON BRAND NAME(S): Camptosar What should I tell my care team before I take this medication? They need to know if you have any of these conditions: Dehydration Diarrhea Infection, especially a viral infection, such as chickenpox, cold sores, herpes Liver disease Low blood cell levels (white cells, red cells, and platelets) Low levels of electrolytes, such as calcium, magnesium, or potassium in your blood Recent or ongoing radiation An unusual or allergic reaction to irinotecan, other  medications, foods, dyes, or preservatives If you or your partner are pregnant or trying to get pregnant Breast-feeding How should I use this medication? This medication is injected into a vein. It is given by your care team in a hospital or clinic setting. Talk to your care team about the use of this medication in children. Special care may be needed. Overdosage: If you think you have taken too much of this medicine contact a poison control center or emergency room at once. NOTE: This medicine is only for you. Do not share this medicine with others. What if I miss a dose? Keep appointments for follow-up doses. It is important not to miss your dose. Call your care team if you are unable to keep an appointment. What may interact with this medication? Do not take this medication with any of the following: Cobicistat Itraconazole This medication may also interact with the following: Certain antibiotics, such as clarithromycin, rifampin, rifabutin Certain antivirals for HIV or AIDS Certain medications for fungal infections, such as ketoconazole, posaconazole, voriconazole Certain medications for seizures, such as carbamazepine, phenobarbital, phenytoin Gemfibrozil Nefazodone St. John's wort This list may not describe all possible interactions. Give your health care provider a list of all the medicines, herbs, non-prescription drugs, or dietary supplements you use. Also tell them if you smoke, drink alcohol, or use illegal drugs. Some items may interact with your medicine. What should I watch for while using this medication? Your condition will be monitored carefully while you are receiving this medication. You may need blood work while taking this medication. This medication may make you feel generally unwell. This is not uncommon as chemotherapy can affect healthy cells as well as cancer cells. Report any side effects. Continue your course of treatment even though you feel ill unless your care  team tells you to stop. This medication can cause serious side effects. To reduce the risk, your care team may give you other medications to take before receiving this one. Be sure to follow the directions from your care team. This medication may affect your coordination, reaction time, or judgement. Do not drive or operate machinery until you know how this medication affects you. Sit up or stand slowly to reduce the risk of dizzy or fainting spells. Drinking alcohol with this medication can increase the risk of these side effects. This medication may increase your risk of getting an infection. Call your care team for advice if you get a fever, chills, sore throat, or other symptoms of a cold or flu. Do not treat yourself. Try to avoid being around people who are sick. Avoid taking medications that contain aspirin, acetaminophen, ibuprofen, naproxen, or ketoprofen unless instructed by your care team. These medications may hide a fever. This medication may increase your risk to bruise or bleed. Call your care team if you notice any unusual bleeding. Be careful brushing or flossing your teeth or using a toothpick because you may get an infection or bleed more easily. If you have any  dental work done, tell your dentist you are receiving this medication. Talk to your care team if you or your partner are pregnant or think either of you might be pregnant. This medication can cause serious birth defects if taken during pregnancy and for 6 months after the last dose. You will need a negative pregnancy test before starting this medication. Contraception is recommended while taking this medication and for 6 months after the last dose. Your care team can help you find the option that works for you. Do not father a child while taking this medication and for 3 months after the last dose. Use a condom for contraception during this time period. Do not breastfeed while taking this medication and for 7 days after the last  dose. This medication may cause infertility. Talk to your care team if you are concerned about your fertility. What side effects may I notice from receiving this medication? Side effects that you should report to your care team as soon as possible: Allergic reactions--skin rash, itching, hives, swelling of the face, lips, tongue, or throat Dry cough, shortness of breath or trouble breathing Increased saliva or tears, increased sweating, stomach cramping, diarrhea, small pupils, unusual weakness or fatigue, slow heartbeat Infection--fever, chills, cough, sore throat, wounds that don't heal, pain or trouble when passing urine, general feeling of discomfort or being unwell Kidney injury--decrease in the amount of urine, swelling of the ankles, hands, or feet Low red blood cell level--unusual weakness or fatigue, dizziness, headache, trouble breathing Severe or prolonged diarrhea Unusual bruising or bleeding Side effects that usually do not require medical attention (report to your care team if they continue or are bothersome): Constipation Diarrhea Hair loss Loss of appetite Nausea Stomach pain This list may not describe all possible side effects. Call your doctor for medical advice about side effects. You may report side effects to FDA at 1-800-FDA-1088. Where should I keep my medication? This medication is given in a hospital or clinic. It will not be stored at home. NOTE: This sheet is a summary. It may not cover all possible information. If you have questions about this medicine, talk to your doctor, pharmacist, or health care provider.  2024 Elsevier/Gold Standard (2022-04-10 00:00:00)  Fluorouracil Injection What is this medication? FLUOROURACIL (flure oh YOOR a sil) treats some types of cancer. It works by slowing down the growth of cancer cells. This medicine may be used for other purposes; ask your health care provider or pharmacist if you have questions. COMMON BRAND NAME(S):  Adrucil What should I tell my care team before I take this medication? They need to know if you have any of these conditions: Blood disorders Dihydropyrimidine dehydrogenase (DPD) deficiency Infection, such as chickenpox, cold sores, herpes Kidney disease Liver disease Poor nutrition Recent or ongoing radiation therapy An unusual or allergic reaction to fluorouracil, other medications, foods, dyes, or preservatives If you or your partner are pregnant or trying to get pregnant Breast-feeding How should I use this medication? This medication is injected into a vein. It is administered by your care team in a hospital or clinic setting. Talk to your care team about the use of this medication in children. Special care may be needed. Overdosage: If you think you have taken too much of this medicine contact a poison control center or emergency room at once. NOTE: This medicine is only for you. Do not share this medicine with others. What if I miss a dose? Keep appointments for follow-up doses. It is important not  to miss your dose. Call your care team if you are unable to keep an appointment. What may interact with this medication? Do not take this medication with any of the following: Live virus vaccines This medication may also interact with the following: Medications that treat or prevent blood clots, such as warfarin, enoxaparin, dalteparin This list may not describe all possible interactions. Give your health care provider a list of all the medicines, herbs, non-prescription drugs, or dietary supplements you use. Also tell them if you smoke, drink alcohol, or use illegal drugs. Some items may interact with your medicine. What should I watch for while using this medication? Your condition will be monitored carefully while you are receiving this medication. This medication may make you feel generally unwell. This is not uncommon as chemotherapy can affect healthy cells as well as cancer  cells. Report any side effects. Continue your course of treatment even though you feel ill unless your care team tells you to stop. In some cases, you may be given additional medications to help with side effects. Follow all directions for their use. This medication may increase your risk of getting an infection. Call your care team for advice if you get a fever, chills, sore throat, or other symptoms of a cold or flu. Do not treat yourself. Try to avoid being around people who are sick. This medication may increase your risk to bruise or bleed. Call your care team if you notice any unusual bleeding. Be careful brushing or flossing your teeth or using a toothpick because you may get an infection or bleed more easily. If you have any dental work done, tell your dentist you are receiving this medication. Avoid taking medications that contain aspirin, acetaminophen, ibuprofen, naproxen, or ketoprofen unless instructed by your care team. These medications may hide a fever. Do not treat diarrhea with over the counter products. Contact your care team if you have diarrhea that lasts more than 2 days or if it is severe and watery. This medication can make you more sensitive to the sun. Keep out of the sun. If you cannot avoid being in the sun, wear protective clothing and sunscreen. Do not use sun lamps, tanning beds, or tanning booths. Talk to your care team if you or your partner wish to become pregnant or think you might be pregnant. This medication can cause serious birth defects if taken during pregnancy and for 3 months after the last dose. A reliable form of contraception is recommended while taking this medication and for 3 months after the last dose. Talk to your care team about effective forms of contraception. Do not father a child while taking this medication and for 3 months after the last dose. Use a condom while having sex during this time period. Do not breastfeed while taking this medication. This  medication may cause infertility. Talk to your care team if you are concerned about your fertility. What side effects may I notice from receiving this medication? Side effects that you should report to your care team as soon as possible: Allergic reactions--skin rash, itching, hives, swelling of the face, lips, tongue, or throat Heart attack--pain or tightness in the chest, shoulders, arms, or jaw, nausea, shortness of breath, cold or clammy skin, feeling faint or lightheaded Heart failure--shortness of breath, swelling of the ankles, feet, or hands, sudden weight gain, unusual weakness or fatigue Heart rhythm changes--fast or irregular heartbeat, dizziness, feeling faint or lightheaded, chest pain, trouble breathing High ammonia level--unusual weakness or fatigue, confusion,  loss of appetite, nausea, vomiting, seizures Infection--fever, chills, cough, sore throat, wounds that don't heal, pain or trouble when passing urine, general feeling of discomfort or being unwell Low red blood cell level--unusual weakness or fatigue, dizziness, headache, trouble breathing Pain, tingling, or numbness in the hands or feet, muscle weakness, change in vision, confusion or trouble speaking, loss of balance or coordination, trouble walking, seizures Redness, swelling, and blistering of the skin over hands and feet Severe or prolonged diarrhea Unusual bruising or bleeding Side effects that usually do not require medical attention (report to your care team if they continue or are bothersome): Dry skin Headache Increased tears Nausea Pain, redness, or swelling with sores inside the mouth or throat Sensitivity to light Vomiting This list may not describe all possible side effects. Call your doctor for medical advice about side effects. You may report side effects to FDA at 1-800-FDA-1088. Where should I keep my medication? This medication is given in a hospital or clinic. It will not be stored at home. NOTE:  This sheet is a summary. It may not cover all possible information. If you have questions about this medicine, talk to your doctor, pharmacist, or health care provider.  2024 Elsevier/Gold Standard (2022-04-04 00:00:00)  BELOW ARE SYMPTOMS THAT SHOULD BE REPORTED IMMEDIATELY: *FEVER GREATER THAN 100.4 F (38 C) OR HIGHER *CHILLS OR SWEATING *NAUSEA AND VOMITING THAT IS NOT CONTROLLED WITH YOUR NAUSEA MEDICATION *UNUSUAL SHORTNESS OF BREATH *UNUSUAL BRUISING OR BLEEDING *URINARY PROBLEMS (pain or burning when urinating, or frequent urination) *BOWEL PROBLEMS (unusual diarrhea, constipation, pain near the anus) TENDERNESS IN MOUTH AND THROAT WITH OR WITHOUT PRESENCE OF ULCERS (sore throat, sores in mouth, or a toothache) UNUSUAL RASH, SWELLING OR PAIN  UNUSUAL VAGINAL DISCHARGE OR ITCHING   Items with * indicate a potential emergency and should be followed up as soon as possible or go to the Emergency Department if any problems should occur.  Please show the CHEMOTHERAPY ALERT CARD or IMMUNOTHERAPY ALERT CARD at check-in to the Emergency Department and triage nurse.  Should you have questions after your visit or need to cancel or reschedule your appointment, please contact [facility] [place] - A DEPT OF [facility] [phone]  and follow the prompts.  Office hours are 8:00 a.m. to 4:30 p.m. Monday - Friday. Please note that voicemails left after 4:00 p.m. may not be returned until the following business day.  We are closed weekends and major holidays. You have access to a nurse at all times for urgent questions. Please call the main number to the clinic [phone] and follow the prompts.  For any non-urgent questions, you may also contact your provider using MyChart. We now offer e-Visits for anyone 19 and older to request care online for non-urgent symptoms. For details visit [email or web address].   Also download the MyChart app! Go to the app store, search  "MyChart", open the app, select [facility], and log in with your MyChart username and password.

## 2024-03-25 DIAGNOSIS — C189 Malignant neoplasm of colon, unspecified: Secondary | ICD-10-CM | POA: Diagnosis not present

## 2024-03-25 LAB — CEA: CEA: 173 ng/mL — ABNORMAL HIGH (ref 0.0–4.7)

## 2024-03-26 ENCOUNTER — Inpatient Hospital Stay

## 2024-03-26 VITALS — BP 101/62 | HR 88 | Temp 97.6°F | Resp 18

## 2024-03-26 DIAGNOSIS — C7801 Secondary malignant neoplasm of right lung: Secondary | ICD-10-CM | POA: Diagnosis not present

## 2024-03-26 DIAGNOSIS — Z5111 Encounter for antineoplastic chemotherapy: Secondary | ICD-10-CM | POA: Diagnosis not present

## 2024-03-26 DIAGNOSIS — Z5112 Encounter for antineoplastic immunotherapy: Secondary | ICD-10-CM | POA: Diagnosis not present

## 2024-03-26 DIAGNOSIS — Z5189 Encounter for other specified aftercare: Secondary | ICD-10-CM | POA: Diagnosis not present

## 2024-03-26 DIAGNOSIS — Z95828 Presence of other vascular implants and grafts: Secondary | ICD-10-CM

## 2024-03-26 DIAGNOSIS — C180 Malignant neoplasm of cecum: Secondary | ICD-10-CM

## 2024-03-26 DIAGNOSIS — C7802 Secondary malignant neoplasm of left lung: Secondary | ICD-10-CM | POA: Diagnosis not present

## 2024-03-26 DIAGNOSIS — C7972 Secondary malignant neoplasm of left adrenal gland: Secondary | ICD-10-CM | POA: Diagnosis not present

## 2024-03-26 MED ORDER — SODIUM CHLORIDE 0.9% FLUSH
10.0000 mL | INTRAVENOUS | Status: DC | PRN
Start: 2024-03-26 — End: 2024-03-26
  Administered 2024-03-26: 10 mL

## 2024-03-26 MED ORDER — PEGFILGRASTIM-CBQV 6 MG/0.6ML ~~LOC~~ SOSY
6.0000 mg | PREFILLED_SYRINGE | Freq: Once | SUBCUTANEOUS | Status: AC
Start: 2024-03-26 — End: 2024-03-26
  Administered 2024-03-26: 6 mg via SUBCUTANEOUS
  Filled 2024-03-26: qty 0.6

## 2024-03-26 MED ORDER — HEPARIN SOD (PORK) LOCK FLUSH 100 UNIT/ML IV SOLN
500.0000 [IU] | Freq: Once | INTRAVENOUS | Status: AC | PRN
  Administered 2024-03-26: 500 [IU]

## 2024-03-26 NOTE — Progress Notes (Signed)
 Patient tolerated injection with no complaints voiced. Site clean and dry with no bruising or swelling noted at site. See MAR for details. Band aid applied.   Chemo pump disconnected. Rn observed bandage around port had come off and patient placed bandages around dressing to try to keep dressing in place. Patient advised to monitor for signs of infection at port site and patient verbalized understanding.  Port flushed with good blood return noted. No bruising or swelling at site. Bandaid applied and patient discharged in satisfactory condition. VVS stable with no signs or symptoms of distressed noted.

## 2024-03-26 NOTE — Patient Instructions (Signed)
 [facility] [place] - A DEPT OF [facility]  Discharge Instructions: Thank you for choosing [facility] to provide your oncology and hematology care.  If you have a lab appointment with the Cancer Center - please note that after April 8th, 2024, all labs will be drawn in the cancer center.  You do not have to check in or register with the main entrance as you have in the past but will complete your check-in in the cancer center.  Wear comfortable clothing and clothing appropriate for easy access to any Portacath or PICC line.   We strive to give you quality time with your provider. You may need to reschedule your appointment if you arrive late (15 or more minutes).  Arriving late affects you and other patients whose appointments are after yours.  Also, if you miss three or more appointments without notifying the office, you may be dismissed from the clinic at the provider's discretion.      For prescription refill requests, have your pharmacy contact our office and allow 72 hours for refills to be completed.    Today you received the following Udencya and pump d/c, return as scheduled.   To help prevent nausea and vomiting after your treatment, we encourage you to take your nausea medication as directed.  BELOW ARE SYMPTOMS THAT SHOULD BE REPORTED IMMEDIATELY: *FEVER GREATER THAN 100.4 F (38 C) OR HIGHER *CHILLS OR SWEATING *NAUSEA AND VOMITING THAT IS NOT CONTROLLED WITH YOUR NAUSEA MEDICATION *UNUSUAL SHORTNESS OF BREATH *UNUSUAL BRUISING OR BLEEDING *URINARY PROBLEMS (pain or burning when urinating, or frequent urination) *BOWEL PROBLEMS (unusual diarrhea, constipation, pain near the anus) TENDERNESS IN MOUTH AND THROAT WITH OR WITHOUT PRESENCE OF ULCERS (sore throat, sores in mouth, or a toothache) UNUSUAL RASH, SWELLING OR PAIN  UNUSUAL VAGINAL DISCHARGE OR ITCHING   Items with * indicate a potential emergency and should be followed up as soon as  possible or go to the Emergency Department if any problems should occur.  Please show the CHEMOTHERAPY ALERT CARD or IMMUNOTHERAPY ALERT CARD at check-in to the Emergency Department and triage nurse.  Should you have questions after your visit or need to cancel or reschedule your appointment, please contact [facility] [place] - A DEPT OF [facility] [phone]  and follow the prompts.  Office hours are 8:00 a.m. to 4:30 p.m. Monday - Friday. Please note that voicemails left after 4:00 p.m. may not be returned until the following business day.  We are closed weekends and major holidays. You have access to a nurse at all times for urgent questions. Please call the main number to the clinic [phone] and follow the prompts.  For any non-urgent questions, you may also contact your provider using MyChart. We now offer e-Visits for anyone 57 and older to request care online for non-urgent symptoms. For details visit [email or web address].   Also download the MyChart app! Go to the app store, search "MyChart", open the app, select [facility], and log in with your MyChart username and password.

## 2024-03-28 ENCOUNTER — Encounter (HOSPITAL_COMMUNITY): Payer: Self-pay | Admitting: Hematology

## 2024-03-28 ENCOUNTER — Encounter: Payer: Self-pay | Admitting: Hematology

## 2024-03-31 ENCOUNTER — Ambulatory Visit (HOSPITAL_COMMUNITY)
Admission: RE | Admit: 2024-03-31 | Discharge: 2024-03-31 | Disposition: A | Discharge status: Home / Self Care | Source: Ambulatory Visit | Attending: Hematology | Admitting: Hematology

## 2024-03-31 DIAGNOSIS — I8500 Esophageal varices without bleeding: Secondary | ICD-10-CM | POA: Diagnosis not present

## 2024-03-31 DIAGNOSIS — C180 Malignant neoplasm of cecum: Secondary | ICD-10-CM | POA: Diagnosis not present

## 2024-03-31 DIAGNOSIS — K769 Liver disease, unspecified: Secondary | ICD-10-CM | POA: Diagnosis not present

## 2024-03-31 MED ORDER — HEPARIN SOD (PORK) LOCK FLUSH 100 UNIT/ML IV SOLN
500.0000 [IU] | Freq: Once | INTRAVENOUS | Status: AC
  Administered 2024-03-31: 500 [IU] via INTRAVENOUS

## 2024-03-31 MED ORDER — IOHEXOL 300 MG/ML  SOLN
100.0000 mL | Freq: Once | INTRAMUSCULAR | Status: AC | PRN
  Administered 2024-03-31: 100 mL via INTRAVENOUS

## 2024-03-31 MED ORDER — HEPARIN SOD (PORK) LOCK FLUSH 100 UNIT/ML IV SOLN
INTRAVENOUS | Status: AC
  Filled 2024-03-31: qty 5

## 2024-04-07 ENCOUNTER — Inpatient Hospital Stay

## 2024-04-07 ENCOUNTER — Inpatient Hospital Stay (HOSPITAL_BASED_OUTPATIENT_CLINIC_OR_DEPARTMENT_OTHER): Admitting: Hematology

## 2024-04-07 ENCOUNTER — Other Ambulatory Visit: Payer: Self-pay | Admitting: *Deleted

## 2024-04-07 VITALS — BP 140/93 | Wt 186.5 lb

## 2024-04-07 VITALS — BP 115/81 | HR 59 | Resp 18

## 2024-04-07 DIAGNOSIS — Z95828 Presence of other vascular implants and grafts: Secondary | ICD-10-CM

## 2024-04-07 DIAGNOSIS — C7802 Secondary malignant neoplasm of left lung: Secondary | ICD-10-CM | POA: Diagnosis not present

## 2024-04-07 DIAGNOSIS — Z5111 Encounter for antineoplastic chemotherapy: Secondary | ICD-10-CM | POA: Diagnosis not present

## 2024-04-07 DIAGNOSIS — C180 Malignant neoplasm of cecum: Secondary | ICD-10-CM

## 2024-04-07 DIAGNOSIS — Z5112 Encounter for antineoplastic immunotherapy: Secondary | ICD-10-CM | POA: Diagnosis not present

## 2024-04-07 DIAGNOSIS — K1379 Other lesions of oral mucosa: Secondary | ICD-10-CM | POA: Diagnosis not present

## 2024-04-07 DIAGNOSIS — Z5189 Encounter for other specified aftercare: Secondary | ICD-10-CM | POA: Diagnosis not present

## 2024-04-07 DIAGNOSIS — C7972 Secondary malignant neoplasm of left adrenal gland: Secondary | ICD-10-CM | POA: Diagnosis not present

## 2024-04-07 DIAGNOSIS — C7801 Secondary malignant neoplasm of right lung: Secondary | ICD-10-CM | POA: Diagnosis not present

## 2024-04-07 LAB — CBC WITH DIFFERENTIAL/PLATELET
Abs Immature Granulocytes: 0.8 10*3/uL — ABNORMAL HIGH (ref 0.00–0.07)
Band Neutrophils: 3 %
Basophils Absolute: 0.1 10*3/uL (ref 0.0–0.1)
Basophils Relative: 1 %
Eosinophils Absolute: 0.4 10*3/uL (ref 0.0–0.5)
Eosinophils Relative: 4 %
HCT: 34.1 % — ABNORMAL LOW (ref 39.0–52.0)
Hemoglobin: 10.8 g/dL — ABNORMAL LOW (ref 13.0–17.0)
Lymphocytes Relative: 23 %
Lymphs Abs: 2.3 10*3/uL (ref 0.7–4.0)
MCH: 29.8 pg (ref 26.0–34.0)
MCHC: 31.7 g/dL (ref 30.0–36.0)
MCV: 93.9 fL (ref 80.0–100.0)
Metamyelocytes Relative: 5 %
Monocytes Absolute: 0.3 10*3/uL (ref 0.1–1.0)
Monocytes Relative: 3 %
Myelocytes: 1 %
Neutro Abs: 6.2 10*3/uL (ref 1.7–7.7)
Neutrophils Relative %: 58 %
Platelets: 136 10*3/uL — ABNORMAL LOW (ref 150–400)
Promyelocytes Relative: 2 %
RBC: 3.63 MIL/uL — ABNORMAL LOW (ref 4.22–5.81)
RDW: 17.2 % — ABNORMAL HIGH (ref 11.5–15.5)
WBC: 10.2 10*3/uL (ref 4.0–10.5)
nRBC: 0 % (ref 0.0–0.2)

## 2024-04-07 LAB — COMPREHENSIVE METABOLIC PANEL WITH GFR
ALT: 13 U/L (ref 0–44)
AST: 27 U/L (ref 15–41)
Albumin: 3.4 g/dL — ABNORMAL LOW (ref 3.5–5.0)
Alkaline Phosphatase: 91 U/L (ref 38–126)
Anion gap: 11 (ref 5–15)
BUN: 6 mg/dL — ABNORMAL LOW (ref 8–23)
CO2: 21 mmol/L — ABNORMAL LOW (ref 22–32)
Calcium: 8.9 mg/dL (ref 8.9–10.3)
Chloride: 104 mmol/L (ref 98–111)
Creatinine, Ser: 0.99 mg/dL (ref 0.61–1.24)
GFR, Estimated: >60 mL/min (ref >60)
Glucose, Bld: 115 mg/dL — ABNORMAL HIGH (ref 70–99)
Potassium: 3.3 mmol/L — ABNORMAL LOW (ref 3.5–5.1)
Sodium: 136 mmol/L (ref 135–145)
Total Bilirubin: 0.5 mg/dL (ref 0.0–1.2)
Total Protein: 7 g/dL (ref 6.5–8.1)

## 2024-04-07 LAB — URINALYSIS, DIPSTICK ONLY
Bilirubin Urine: NEGATIVE
Glucose, UA: NEGATIVE mg/dL
Hgb urine dipstick: NEGATIVE
Ketones, ur: NEGATIVE mg/dL
Leukocytes,Ua: NEGATIVE
Nitrite: NEGATIVE
Protein, ur: 100 mg/dL — AB
Specific Gravity, Urine: 1.018 (ref 1.005–1.030)
pH: 5 (ref 5.0–8.0)

## 2024-04-07 LAB — MAGNESIUM: Magnesium: 1.9 mg/dL (ref 1.7–2.4)

## 2024-04-07 MED ORDER — PALONOSETRON HCL INJECTION 0.25 MG/5ML
0.2500 mg | Freq: Once | INTRAVENOUS | Status: AC
  Administered 2024-04-07: 0.25 mg via INTRAVENOUS
  Filled 2024-04-07: qty 5

## 2024-04-07 MED ORDER — SODIUM CHLORIDE 0.9 % IV SOLN: Freq: Once | INTRAVENOUS | Status: AC

## 2024-04-07 MED ORDER — POTASSIUM CHLORIDE CRYS ER 20 MEQ PO TBCR
40.0000 meq | EXTENDED_RELEASE_TABLET | Freq: Once | ORAL | Status: AC
  Filled 2024-04-07: qty 2

## 2024-04-07 MED ORDER — ATROPINE SULFATE 1 MG/ML IV SOLN
1.0000 mg | Freq: Once | INTRAVENOUS | Status: AC
  Administered 2024-04-07: 1 mg via INTRAVENOUS
  Filled 2024-04-07: qty 1

## 2024-04-07 MED ORDER — SODIUM CHLORIDE 0.9 % IV SOLN
144.0000 mg/m2 | Freq: Once | INTRAVENOUS | Status: AC
  Administered 2024-04-07: 300 mg via INTRAVENOUS
  Filled 2024-04-07: qty 15

## 2024-04-07 MED ORDER — SODIUM CHLORIDE 0.9% FLUSH
10.0000 mL | Freq: Once | INTRAVENOUS | Status: AC
  Administered 2024-04-07: 10 mL via INTRAVENOUS

## 2024-04-07 MED ORDER — FLUOROURACIL CHEMO INJECTION 2.5 GM/50ML
320.0000 mg/m2 | Freq: Once | INTRAVENOUS | Status: AC
Start: 2024-04-07 — End: 2024-04-07
  Administered 2024-04-07: 600 mg via INTRAVENOUS
  Filled 2024-04-07: qty 12

## 2024-04-07 MED ORDER — SODIUM CHLORIDE 0.9 % IV SOLN
150.0000 mg | Freq: Once | INTRAVENOUS | Status: AC
  Administered 2024-04-07: 150 mg via INTRAVENOUS
  Filled 2024-04-07: qty 150

## 2024-04-07 MED ORDER — DEXAMETHASONE SODIUM PHOSPHATE 10 MG/ML IJ SOLN
10.0000 mg | Freq: Once | INTRAMUSCULAR | Status: AC
  Administered 2024-04-07: 10 mg via INTRAVENOUS
  Filled 2024-04-07: qty 1

## 2024-04-07 MED ORDER — SODIUM CHLORIDE 0.9 % IV SOLN
400.0000 mg/m2 | Freq: Once | INTRAVENOUS | Status: AC
  Administered 2024-04-07: 780 mg via INTRAVENOUS
  Filled 2024-04-07: qty 39

## 2024-04-07 MED ORDER — MISC. DEVICES MISC: 3 refills | Status: DC

## 2024-04-07 MED ORDER — SODIUM CHLORIDE 0.9 % IV SOLN
5.0000 mg/kg | Freq: Once | INTRAVENOUS | Status: AC
  Administered 2024-04-07: 400 mg via INTRAVENOUS
  Filled 2024-04-07: qty 16

## 2024-04-07 MED ORDER — OXYCODONE HCL 10 MG PO TABS: 10.0000 mg | ORAL_TABLET | ORAL | 0 refills | Status: DC | PRN

## 2024-04-07 MED ORDER — SODIUM CHLORIDE 0.9 % IV SOLN
1920.0000 mg/m2 | INTRAVENOUS | Status: AC
  Administered 2024-04-07: 3500 mg via INTRAVENOUS
  Filled 2024-04-07: qty 70

## 2024-04-07 NOTE — Progress Notes (Signed)
 [facility] [address] [place] [address]    Clinic Day:  04/07/2024  Referring physician: Jolynn Needy, PA  Patient Care Team: Jolynn Needy, [name] as PCP - General (Physician Assistant) Paulett Boros, MD as Medical Oncologist (Medical Oncology) Gerhard Knuckles, RN as Oncology Nurse Navigator (Medical Oncology)   ASSESSMENT & PLAN:   Assessment: 1.  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 - 30 pound weight loss in the last couple of years, weight stable over the last 6 months. - CT chest with contrast on 12/16/2021 showed bulky left hilar mass measuring 8.1 x 7.5 cm.  Numerous bilateral lung nodules of varying sizes.  Left adrenal nodule measuring 2.8 x 2.2 cm.  Mediastinal and bilateral hilar adenopathy with the largest pretracheal node measuring 4.1 x 3.1 cm. - MRI of the brain from 01/04/2022 which was negative for metastatic disease. - CTAP from 01/03/2022 which showed isolated left adrenal metastasis with no other evidence of metastatic disease in the abdomen or pelvis. - Pathology of left lung biopsy which shows adenocarcinoma with necrosis.  CK20 positive and CDX2 positive but negative for CK7 indicating colonic primary. 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  HER2 negative.  TMB low.  MSI-stable.  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  Other targetable mutations negative. - FOLFIRI started on 02/22/2022, bevacizumab  added with cycle 4  - CT CAP (05/17/2022): Mediastinal and hilar lymph nodes have decreased in size.  Largest perihilar left lower lobe lung mass has decreased in size.  Right upper lobe mass slightly increased in size.  Other nodules are stable.  Left adrenal mass decreased to 1.3 cm from 2.8 cm. - CT scan showed mixed response as it was compared to CT from 12/16/2021.  He did not start chemotherapy until 02/22/2022. - Maintenance 5-FU and bevacizumab  from September 2023 through  08/29/2023 with progression - FOLFIRI and bevacizumab  started on 09/12/2023   2. Social/family history: - Lives by himself.  He paints houses for living. - He quit smoking 12 years back and started back again 1 and half year ago and smoked half pack per day.  He quit again about 1 week ago. - Father had cancer, type unknown to the patient.  Brother died of brain tumor.  3.  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  Margins negative.  No lymphovascular or perineural invasion. - Received 3 cycles of XELOX followed by Xeloda for total of 6 months.  Oxaliplatin discontinued during cycle 4 secondary to transaminitis, elevated bilirubin and thrombocytopenia.  However he was also treated for hep C with Harvoni after that.    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 - Denies any worsening of chest pains or significant bleeding/hemoptysis. - Labs today: Normal LFTs and creatinine.  CBC is normal with mild thrombocytopenia.  CEA is 173, went up from 149.  UA shows protein 100. - CT CAP (03/31/2024): Stable lung metastasis and mediastinal and hilar lymph nodes.  Evidence of chronic liver disease with hepatosplenomegaly, varices.  No ascites.  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 - We have to be careful about potential variceal bleeding while on Avastin .  However Avastin  is really needed for his disease control at this time. - We will proceed  with treatment today and every 2 weeks.  RTC 6 weeks for follow-up. - At next visit, I will talk to him about GI evaluation for possible banding.  2.  Lower rib/epigastric pain: - Continue oxycodone  10 mg every 6 hours as needed.  Pain is well-controlled.  3.  Difficulty falling asleep: - Continue trazodone  daily as needed.  He is not requiring daily.  4.  Hypertension: -  Continue amlodipine  10 mg daily.  He forgets taking lisinopril  12 mg in the evenings.  Blood pressure today is 140/90.   5.  Hypokalemia: - Continue K-Dur 20 mill equivalents daily.  Potassium today is 3.3.    Orders Placed This Encounter  Procedures   CEA    Standing Status:   Future    Expected Date:   06/01/2024    Expiration Date:   06/01/2025   Magnesium     Standing Status:   Future    Expected Date:   06/01/2024    Expiration Date:   06/01/2025   CBC with Differential    Standing Status:   Future    Expected Date:   06/01/2024    Expiration Date:   06/01/2025   Comprehensive metabolic panel    Standing Status:   Future    Expected Date:   06/01/2024    Expiration Date:   06/01/2025   Urinalysis, dipstick only    Standing Status:   Future    Expected Date:   06/01/2024    Expiration Date:   06/01/2025   CEA    Standing Status:   Future    Expected Date:   06/15/2024    Expiration Date:   06/15/2025   Magnesium     Standing Status:   Future    Expected Date:   06/15/2024    Expiration Date:   06/15/2025   CBC with Differential    Standing Status:   Future    Expected Date:   06/15/2024    Expiration Date:   06/15/2025   Comprehensive metabolic panel    Standing Status:   Future    Expected Date:   06/15/2024    Expiration Date:   06/15/2025   Urinalysis, dipstick only    Standing Status:   Future    Expected Date:   06/15/2024    Expiration Date:   06/15/2025   CEA    Standing Status:   Future    Expected Date:   06/29/2024    Expiration Date:   06/29/2025   Magnesium     Standing Status:   Future    Expected Date:   06/29/2024    Expiration Date:   06/29/2025   CBC with Differential    Standing Status:   Future    Expected Date:   06/29/2024    Expiration Date:   06/29/2025   Comprehensive metabolic panel    Standing Status:   Future    Expected Date:   06/29/2024    Expiration Date:   06/29/2025   Urinalysis, dipstick only    Standing Status:   Future    Expected Date:   06/29/2024     Expiration Date:   06/29/2025   CEA    Standing Status:   Future    Expected Date:   07/13/2024    Expiration Date:   07/13/2025   Magnesium     Standing Status:   Future    Expected Date:   07/13/2024    Expiration Date:   07/13/2025   CBC  with Differential    Standing Status:   Future    Expected Date:   07/13/2024    Expiration Date:   07/13/2025   Comprehensive metabolic panel    Standing Status:   Future    Expected Date:   07/13/2024    Expiration Date:   07/13/2025   Urinalysis, dipstick only    Standing Status:   Future    Expected Date:   07/13/2024    Expiration Date:   07/13/2025      I,Katie Daubenspeck,acting as a scribe for Paulett Boros, MD.,have documented all relevant documentation on the behalf of Paulett Boros, MD,as directed by  Paulett Boros, MD while in the presence of Paulett Boros, MD.   I, Paulett Boros MD, have reviewed the above documentation for accuracy and completeness, and I agree with the above.   Paulett Boros, MD   4/28/202511:33 AM  CHIEF COMPLAINT:   Diagnosis: metastatic colon cancer to the lungs and left adrenal gland    Cancer Staging  Cecal cancer [facility]) Staging form: Colon and Rectum, AJCC 8th Edition - Clinical stage from 10/07/2019: Stage IIIB (cT3, cN1, cM0) - Signed by Paulett Boros, MD on 10/07/2019 - Pathologic stage from 01/23/2022: Stage IVB (rpTX, pN0, pM1b) - Unsigned    Prior Therapy: 1. FOLFIRI 02/22/22 - 08/08/22  2. Maintenance 5-FU and bevacizumab , 08/2022 - 08/29/23  Current Therapy:  FOLFIRI and bevacizumab     HISTORY OF PRESENT ILLNESS:   Oncology History  Cecal cancer (HCC)  10/07/2019 Initial Diagnosis   Cecal cancer (HCC)   10/07/2019 Cancer Staging   Staging form: Colon and Rectum, AJCC 8th Edition - Clinical stage from 10/07/2019: Stage IIIB (cT3, cN1, cM0) - Signed by Paulett Boros, MD on 10/07/2019   02/22/2022 - 08/08/2022 Chemotherapy   Patient is on Treatment Plan :  COLORECTAL FOLFIRI / BEVACIZUMAB  Q14D     02/22/2022 -  Chemotherapy   Patient is on Treatment Plan : COLORECTAL FOLFIRI + Bevacizumab  q14d        INTERVAL HISTORY:   Evan [name] is a 68 y.o. male presenting to clinic today for follow up of metastatic colon cancer to the lungs and left adrenal gland. He was last seen by me on 02/25/24.  Since his last visit, he underwent restaging CT C/A/P on 03/31/24 showing essentially stable disease.  Today, he states that he is doing well overall. His appetite level is at 100%. His energy level is at 50%.  PAST MEDICAL HISTORY:   Past Medical History: Past Medical History:  Diagnosis Date   Arthritis    Colon cancer (HCC)    colon   Hepatitis C    Hypertension    Port-A-Cath in place 02/15/2022    Surgical History: Past Surgical History:  Procedure Laterality Date   BIOPSY  03/12/2023   Procedure: BIOPSY;  Surgeon: Vinetta Greening, DO;  Location: [facility];  Service: Endoscopy;;   COLON SURGERY     ESOPHAGEAL BANDING N/A 03/12/2023   Procedure: ESOPHAGEAL BANDING;  Surgeon: Vinetta Greening, DO;  Location: [facility];  Service: Endoscopy;  Laterality: N/A;   ESOPHAGOGASTRODUODENOSCOPY (EGD) WITH PROPOFOL  N/A 03/12/2023   Procedure: ESOPHAGOGASTRODUODENOSCOPY (EGD) WITH PROPOFOL ;  Surgeon: Vinetta Greening, DO;  Location: [facility];  Service: Endoscopy;  Laterality: N/A;  11:30AM; ASA 3   PORTACATH PLACEMENT Right 02/08/2022   Procedure: INSERTION PORT-A-CATH- RIJ;  Surgeon: Marijo Shove, DO;  Location: [facility];  Service: General;  Laterality: Right;   REPLACEMENT  TOTAL KNEE Left    SHOULDER ARTHROSCOPY Bilateral    TOTAL HIP ARTHROPLASTY Right 11/09/2020   Procedure: TOTAL HIP ARTHROPLASTY ANTERIOR APPROACH;  Surgeon: Saundra Curl, MD;  Location: [facility];  Service: Orthopedics;  Laterality: Right;   WRIST SURGERY Left     Social History: Social History   Socioeconomic History   Marital status: Married    Spouse name:  Not on file   Number of children: 7   Years of education: Not on file   Highest education level: Not on file  Occupational History   Occupation: employed  Tobacco Use   Smoking status: Former    Current packs/day: 0.00    Average packs/day: 1.5 packs/day for 20.0 years (30.0 ttl pk-yrs)    Types: Cigarettes    Start date: 11/19/1985    Quit date: 11/19/2005    Years since quitting: 18.3   Smokeless tobacco: Never  Vaping Use   Vaping status: Never Used  Substance and Sexual Activity   Alcohol  use: Not Currently   Drug use: Never   Sexual activity: Yes  Other Topics Concern   Not on file  Social History Narrative   ** Merged History Encounter **       Separated from wife 11/2021   Social Drivers of Health   Financial Resource Strain: Low Risk  (10/07/2019)   Overall Financial Resource Strain (CARDIA)    Difficulty of Paying Living Expenses: Not very hard  Food Insecurity: No Food Insecurity (10/07/2019)   Hunger Vital Sign    Worried About Running Out of Food in the Last Year: Never true    Ran Out of Food in the Last Year: Never true  Transportation Needs: No Transportation Needs (10/07/2019)   PRAPARE - Administrator, Civil Service (Medical): No    Lack of Transportation (Non-Medical): No  Physical Activity: Inactive (10/07/2019)   Exercise Vital Sign    Days of Exercise per Week: 0 days    Minutes of Exercise per Session: 0 min  Stress: No Stress Concern Present (10/07/2019)   [employer] of Occupational Health - Occupational Stress Questionnaire    Feeling of Stress : Not at all  Social Connections: Moderately Integrated (10/07/2019)   Social Connection and Isolation Panel [NHANES]    Frequency of Communication with Friends and Family: Once a week    Frequency of Social Gatherings with Friends and Family: Once a week    Attends Religious Services: More than 4 times per year    Active Member of [employer] or Organizations: Yes    Attends Museum/gallery exhibitions officer: More than 4 times per year    Marital Status: Married  Catering manager Violence: Not At Risk (10/07/2019)   Humiliation, Afraid, Rape, and Kick questionnaire    Fear of Current or Ex-Partner: No    Emotionally Abused: No    Physically Abused: No    Sexually Abused: No    Family History: Family History  Problem Relation Age of Onset   Heart disease Mother    Dementia Father    Heart disease Sister    Cancer Brother    Hypertension Brother    Hypertension Brother    Healthy Son    Healthy Son    Healthy Son    Healthy Son    Healthy Daughter    Healthy Daughter    Healthy Daughter     Current Medications:  Current Outpatient Medications:    aluminum -magnesium  hydroxide-simethicone  (MAALOX) 200-200-20 MG/5ML  SUSP, Take 30 mLs by mouth 4 (four) times daily -  before meals and at bedtime., Disp: 1680 mL, Rfl: 2   amLODipine  (NORVASC ) 10 MG tablet, Take 1 tablet (10 mg total) by mouth daily., Disp: 90 tablet, Rfl: 3   Bevacizumab  (AVASTIN  IV), Inject into the vein every 14 (fourteen) days. *start date TBD, Disp: , Rfl:    ENULOSE  10 GM/15ML SOLN, Take 10 g by mouth daily as needed., Disp: , Rfl:    fluorouracil  CALGB 87564 2,400 mg/m2 in sodium chloride  0.9 % 150 mL, Inject 2,400 mg/m2 into the vein over 48 hr., Disp: , Rfl:    FLUOROURACIL  IV, Inject into the vein every 14 (fourteen) days., Disp: , Rfl:    Lactulose  20 GM/30ML SOLN, Take 15 mLs (10 g total) by mouth at bedtime. Take 15 ml at bedtime every night to assist with regular bowel movements.  Titrate down if having multiple bowel movements.  If a bowel movement has not occurred in 3 to 4 days or longer, then take 15 ml every 3 hours until a bowel movent has occurred., Disp: 450 mL, Rfl: 5   LEUCOVORIN  CALCIUM  IV, Inject into the vein every 14 (fourteen) days., Disp: , Rfl:    lidocaine -prilocaine  (EMLA ) cream, Apply 1 Application topically as needed (Apply to port site 30 mins- 1 hour prior to  treatment/labs.)., Disp: 30 g, Rfl: 0   lisinopril  (ZESTRIL ) 10 MG tablet, Take 1 tablet (10 mg total) by mouth daily., Disp: 30 tablet, Rfl: 3   loperamide  (IMODIUM ) 2 MG capsule, Take 1 capsule (2 mg total) by mouth as needed for diarrhea or loose stools (Take 2 capsules after first loose stool and then 1 capsule after each loose stool, but do not exceed more than 8 capsules in a 24 hour period)., Disp: 30 capsule, Rfl: 0   megestrol  (MEGACE ) 400 MG/10ML suspension, Take 10 mLs (400 mg total) by mouth 2 (two) times daily., Disp: 480 mL, Rfl: 3   naloxone  (NARCAN ) nasal spray 4 mg/0.1 mL, 0.1ml nasal spray in one nostril. May repeat the dose in other nostril in 2-3 minutes if needed., Disp: 2 each, Rfl: 3   Oxycodone  HCl 10 MG TABS, Take 1 tablet (10 mg total) by mouth every 4 (four) hours as needed., Disp: 180 tablet, Rfl: 0   potassium chloride  SA (KLOR-CON  M) 20 MEQ tablet, Take 1 tablet (20 mEq total) by mouth daily., Disp: 90 tablet, Rfl: 4   Misc. Devices MISC, Please provide patient with 1:1 ratio of magic mouthwash and lidocaine  to swish and swallow QID, Disp: 1 each, Rfl: 3   pantoprazole  (PROTONIX ) 40 MG tablet, Take 1 tablet (40 mg total) by mouth daily., Disp: 30 tablet, Rfl: 11 No current facility-administered medications for this visit.  Facility-Administered Medications Ordered in Other Visits:    atropine  injection 1 mg, 1 mg, Intravenous, Once, Valeda Corzine, MD   bevacizumab -awwb (MVASI ) 400 mg in sodium chloride  0.9 % 100 mL chemo infusion, 5 mg/kg (Order-Specific), Intravenous, Once, Paulett Boros, MD   fluorouracil  (ADRUCIL ) 3,500 mg in sodium chloride  0.9 % 80 mL chemo infusion, 1,920 mg/m2 (Treatment Plan Recorded), Intravenous, 1 day or 1 dose, Trudi Morgenthaler, MD   fluorouracil  (ADRUCIL ) chemo injection 600 mg, 320 mg/m2 (Treatment Plan Recorded), Intravenous, Once, Paulett Boros, MD   fosaprepitant  (EMEND) 150 mg in sodium chloride  0.9 % 145 mL  IVPB, 150 mg, Intravenous, Once, Paulett Boros, MD, Last Rate: 450 mL/hr at 04/07/24 1126, 150 mg at 04/07/24 1126  irinotecan  (CAMPTOSAR ) 300 mg in sodium chloride  0.9 % 500 mL chemo infusion, 144 mg/m2 (Treatment Plan Recorded), Intravenous, Once, Paulett Boros, MD   leucovorin  780 mg in sodium chloride  0.9 % 250 mL infusion, 400 mg/m2 (Treatment Plan Recorded), Intravenous, Once, Paulett Boros, MD   potassium chloride  SA (KLOR-CON  M) CR tablet 40 mEq, 40 mEq, Oral, Once, Paulett Boros, MD   Allergies: No Known Allergies  REVIEW OF SYSTEMS:   Review of Systems  Constitutional:  Negative for chills, fatigue and fever.  HENT:   Negative for lump/mass, mouth sores, nosebleeds, sore throat and trouble swallowing.   Eyes:  Negative for eye problems.  Respiratory:  Positive for cough and shortness of breath.   Cardiovascular:  Negative for chest pain, leg swelling and palpitations.  Gastrointestinal:  Positive for constipation and diarrhea. Negative for abdominal pain, nausea and vomiting.  Genitourinary:  Negative for bladder incontinence, difficulty urinating, dysuria, frequency, hematuria and nocturia.   Musculoskeletal:  Negative for arthralgias, back pain, flank pain, myalgias and neck pain.  Skin:  Negative for itching and rash.  Neurological:  Positive for headaches. Negative for dizziness and numbness.  Hematological:  Does not bruise/bleed easily.  Psychiatric/Behavioral:  Negative for depression, sleep disturbance and suicidal ideas. The patient is not nervous/anxious.   All other systems reviewed and are negative.    VITALS:   Blood pressure (!) 140/93, weight 186 lb 8 oz (84.6 kg).  Wt Readings from Last 3 Encounters:  04/07/24 186 lb 8 oz (84.6 kg)  03/24/24 182 lb 5.1 oz (82.7 kg)  03/10/24 187 lb 9.8 oz (85.1 kg)    Body mass index is 26.01 kg/m.  Performance status (ECOG): 1 - Symptomatic but completely ambulatory  PHYSICAL EXAM:    Physical Exam Vitals and nursing note reviewed. Exam conducted with a chaperone present.  Constitutional:      Appearance: Normal appearance.  Cardiovascular:     Rate and Rhythm: Normal rate and regular rhythm.     Pulses: Normal pulses.     Heart sounds: Normal heart sounds.  Pulmonary:     Effort: Pulmonary effort is normal.     Breath sounds: Normal breath sounds.  Abdominal:     Palpations: Abdomen is soft. There is no hepatomegaly, splenomegaly or mass.     Tenderness: There is no abdominal tenderness.  Musculoskeletal:     Right lower leg: No edema.     Left lower leg: No edema.  Lymphadenopathy:     Cervical: No cervical adenopathy.     Right cervical: No superficial, deep or posterior cervical adenopathy.    Left cervical: No superficial, deep or posterior cervical adenopathy.     Upper Body:     Right upper body: No supraclavicular or axillary adenopathy.     Left upper body: No supraclavicular or axillary adenopathy.  Neurological:     General: No focal deficit present.     Mental Status: He is alert and oriented to person, place, and time.  Psychiatric:        Mood and Affect: Mood normal.        Behavior: Behavior normal.     LABS:   CBC     Component Value Date/Time   WBC 10.2 04/07/2024 0937   RBC 3.63 (L) 04/07/2024 0937   HGB 10.8 (L) 04/07/2024 0937   HCT 34.1 (L) 04/07/2024 0937   PLT 136 (L) 04/07/2024 0937   MCV 93.9 04/07/2024 0937   MCH 29.8 04/07/2024 7846  MCHC 31.7 04/07/2024 0937   RDW 17.2 (H) 04/07/2024 0937   LYMPHSABS 2.3 04/07/2024 0937   MONOABS 0.3 04/07/2024 0937   EOSABS 0.4 04/07/2024 0937   BASOSABS 0.1 04/07/2024 0937    CMP      Component Value Date/Time   NA 136 04/07/2024 0937   K 3.3 (L) 04/07/2024 0937   CL 104 04/07/2024 0937   CO2 21 (L) 04/07/2024 0937   GLUCOSE 115 (H) 04/07/2024 0937   BUN 6 (L) 04/07/2024 0937   CREATININE 0.99 04/07/2024 0937   CALCIUM  8.9 04/07/2024 0937   PROT 7.0 04/07/2024  0937   ALBUMIN 3.4 (L) 04/07/2024 0937   AST 27 04/07/2024 0937   ALT 13 04/07/2024 0937   ALKPHOS 91 04/07/2024 0937   BILITOT 0.5 04/07/2024 0937   GFRNONAA >60 04/07/2024 4098     Lab Results  Component Value Date   CEA1 173.0 (H) 03/24/2024   /  CEA  Date Value Ref Range Status  03/24/2024 173.0 (H) 0.0 - 4.7 ng/mL Final    Comment:    (NOTE)                             Nonsmokers          <3.9                             Smokers             <5.6 [employer] Electrochemiluminescence Immunoassay (ECLIA) Values obtained with different assay methods or kits cannot be used interchangeably.  Results cannot be interpreted as absolute evidence of the presence or absence of malignant disease. Performed At: [facility] [address] [place], [place] [id number] Pearlean Botts MD FA:[id number]    No results found for: "PSA1" No results found for: "CAN199" No results found for: "CAN125"  No results found for: "TOTALPROTELP", "ALBUMINELP", "A1GS", "A2GS", "BETS", "BETA2SER", "GAMS", "MSPIKE", "SPEI" Lab Results  Component Value Date   TIBC 406 01/16/2023   TIBC 294 06/06/2022   TIBC 237 (L) 02/22/2022   FERRITIN 150 01/16/2023   FERRITIN 243 06/06/2022   FERRITIN 292 02/22/2022   IRONPCTSAT 18 01/16/2023   IRONPCTSAT 24 06/06/2022   IRONPCTSAT 11 (L) 02/22/2022   Lab Results  Component Value Date   LDH 258 (H) 12/26/2021     STUDIES:   CT CHEST ABDOMEN PELVIS W CONTRAST Result Date: 04/04/2024 CLINICAL DATA:  Cecal cancer. Metastatic disease evaluation. * Tracking Code: BO *. EXAM: CT CHEST, ABDOMEN, AND PELVIS WITH CONTRAST TECHNIQUE: Multidetector CT imaging of the chest, abdomen and pelvis was performed following the standard protocol during bolus administration of intravenous contrast. RADIATION DOSE REDUCTION: This exam was performed according to the departmental dose-optimization program which includes automated exposure control, adjustment of the  mA and/or kV according to patient size and/or use of iterative reconstruction technique. CONTRAST:  OMNIPAQUE  IOHEXOL  300 MG/ML  SOLN COMPARISON:  CT 01/09/2024 and older. FINDINGS: CT CHEST FINDINGS Cardiovascular: Heart is nonenlarged. No pericardial effusion. The thoracic aorta is normal course and caliber with slight atherosclerotic calcified and noncalcified plaque. Right IJ chest port. Tip of the catheter extends into the central SVC. Port is accessed. Mediastinum/Nodes: Small thyroid gland. Normal caliber thoracic esophagus. There are some prominent vessels are varices along the lower esophagus. Once again there are some enlarged nodes identified including the right lung hilum which previously  measured 2.4 cm in short axis and today 2.4 cm on image 30 of series 2. Subcarinal node has short axis of 11 mm on image 33 today and previously 11 mm. Right paratracheal node/precarinal node measures 17 mm short axis today and previously 18 mm. No new nodes in the left hilum. No axillary nodes. Lungs/Pleura: Multiple bilateral spiculated nodules are identified of varying size and extensive diffuse distribution. Specific lesions will be followed for continuity. The confluent nodule in the right upper lobe which previously measured 3.2 x 2.3 cm, today measures 3.3 by 2.4 cm on series 3, image 54. Juxtapleural nodule right lower lobe which measured 2.7 x 0.8 cm, today on image 110 of series 3 measures 2.9 by 0.8 cm. Large left lower lobe mass abutting the pleura and hilum on image 91 of series 3 today measures 6.7 x 3.8 cm and previously 6.9 x 3.8 cm. Associated bronchial narrowing or occlusion in this location is stable. Overall the nodules are very similar to previous. Trace left pleural fluid. No pneumothorax. No separate consolidation. Musculoskeletal: Diffuse degenerative changes along the spine. 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 Surgical changes along the shoulders. CT ABDOMEN  PELVIS FINDINGS Hepatobiliary: Slight nodular contour to the liver. Hepatomegaly. No space-occupying liver lesion. Contracted gallbladder patent portal vein. Pancreas: Unremarkable. No pancreatic ductal dilatation or surrounding inflammatory changes. Spleen: Spleen is enlarged. Previously the maximal diameter of 14 cm and today 13.5 cm. Preserved enhancement. Adrenals/Urinary Tract: Stable nodular thickening of the adrenal glands. Left adrenal focus measured 13 x 9 mm previously and 13 by 10 mm today on series 2, image 59. No enhancing renal mass or collecting system dilatation. The ureters have normal course and caliber extending down to the urinary bladder. Preserved contour to the urinary bladder. Stomach/Bowel: Oral contrast was administered. The large bowel has moderate diffuse colonic stool. Redundant course of the transverse colon. Surgical changes along the right side of the colon. Partial colectomy. The small bowel is nondilated. No abnormal soft tissue at the suture line. Fluid in the stomach. Vascular/Lymphatic: Aortic atherosclerosis. No enlarged abdominal or pelvic lymph nodes. Mild varices. Reproductive: Prostate is unremarkable. Other: No free air or free fluid. 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 Scattered degenerative changes of the spine and pelvis. 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 Numerous abnormal mediastinal and hilar nodes are also similar to previous. Trace left pleural effusion.  Stable left adrenal nodularity. Evidence of chronic liver disease with hepatosplenomegaly, varices. No ascites. Patent portal vein. 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 Surgical changes from partial right colonic resection. Electronically Signed   By: Adrianna Horde M.D.   On: 04/04/2024 11:20

## 2024-04-07 NOTE — Patient Instructions (Signed)
 [facility] [place] - A DEPT OF [facility]  Discharge Instructions: Thank you for choosing [facility] to provide your oncology and hematology care.  If you have a lab appointment with the Cancer Center - please note that after April 8th, 2024, all labs will be drawn in the cancer center.  You do not have to check in or register with the main entrance as you have in the past but will complete your check-in in the cancer center.  Wear comfortable clothing and clothing appropriate for easy access to any Portacath or PICC line.   We strive to give you quality time with your provider. You may need to reschedule your appointment if you arrive late (15 or more minutes).  Arriving late affects you and other patients whose appointments are after yours.  Also, if you miss three or more appointments without notifying the office, you may be dismissed from the clinic at the provider's discretion.      For prescription refill requests, have your pharmacy contact our office and allow 72 hours for refills to be completed.    Today you received the following chemotherapy and/or immunotherapy agents avastin, irinotecan, leucovorin, adrucil   To help prevent nausea and vomiting after your treatment, we encourage you to take your nausea medication as directed.  BELOW ARE SYMPTOMS THAT SHOULD BE REPORTED IMMEDIATELY: *FEVER GREATER THAN 100.4 F (38 C) OR HIGHER *CHILLS OR SWEATING *NAUSEA AND VOMITING THAT IS NOT CONTROLLED WITH YOUR NAUSEA MEDICATION *UNUSUAL SHORTNESS OF BREATH *UNUSUAL BRUISING OR BLEEDING *URINARY PROBLEMS (pain or burning when urinating, or frequent urination) *BOWEL PROBLEMS (unusual diarrhea, constipation, pain near the anus) TENDERNESS IN MOUTH AND THROAT WITH OR WITHOUT PRESENCE OF ULCERS (sore throat, sores in mouth, or a toothache) UNUSUAL RASH, SWELLING OR PAIN  UNUSUAL VAGINAL DISCHARGE OR ITCHING   Items with * indicate a potential  emergency and should be followed up as soon as possible or go to the Emergency Department if any problems should occur.  Please show the CHEMOTHERAPY ALERT CARD or IMMUNOTHERAPY ALERT CARD at check-in to the Emergency Department and triage nurse.  Should you have questions after your visit or need to cancel or reschedule your appointment, please contact [facility]CER CTR [place] - A DEPT OF [facility] [phone]  and follow the prompts.  Office hours are 8:00 a.m. to 4:30 p.m. Monday - Friday. Please note that voicemails left after 4:00 p.m. may not be returned until the following business day.  We are closed weekends and major holidays. You have access to a nurse at all times for urgent questions. Please call the main number to the clinic [phone] and follow the prompts.  For any non-urgent questions, you may also contact your provider using MyChart. We now offer e-Visits for anyone 46 and older to request care online for non-urgent symptoms. For details visit [email or web address].   Also download the MyChart app! Go to the app store, search "MyChart", open the app, select [place], and log in with your MyChart username and password.

## 2024-04-07 NOTE — Patient Instructions (Signed)
[facility] at [facility] Discharge Instructions   You were seen and examined today by Dr. Ellin Saba.  He reviewed the results of your lab work which are normal/stable.   He reviewed the results of your CT scan which is stable. The cancer has not grown or spread.   We will proceed with your treatment today.   Return as scheduled.    Thank you for choosing [facility] at [facility] to provide your oncology and hematology care.  To afford each patient quality time with our provider, please arrive at least 15 minutes before your scheduled appointment time.   If you have a lab appointment with the Cancer Center please come in thru the Main Entrance and check in at the main information desk.  You need to re-schedule your appointment should you arrive 10 or more minutes late.  We strive to give you quality time with our providers, and arriving late affects you and other patients whose appointments are after yours.  Also, if you no show three or more times for appointments you may be dismissed from the clinic at the providers discretion.     Again, thank you for choosing [facility].  Our hope is that these requests will decrease the amount of time that you wait before being seen by our physicians.       _____________________________________________________________  Should you have questions after your visit to [facility], please contact our office at [phone] and follow the prompts.  Our office hours are 8:00 a.m. and 4:30 p.m. Monday - Friday.  Please note that voicemails left after 4:00 p.m. may not be returned until the following business day.  We are closed weekends and major holidays.  You do have access to a nurse 24-7, just call the main number to the clinic [phone] and do not press any options, hold on the line and a nurse will answer the phone.    For prescription refill requests, have your pharmacy  contact our office and allow 72 hours.    Due to Covid, you will need to wear a mask upon entering the hospital. If you do not have a mask, a mask will be given to you at the Main Entrance upon arrival. For doctor visits, patients may have 1 support person age 45 or older with them. For treatment visits, patients can not have anyone with them due to social distancing guidelines and our immunocompromised population.

## 2024-04-07 NOTE — Addendum Note (Signed)
 Addended by: Evaline Hill on: 04/07/2024 11:23 AM   Modules accepted: Orders

## 2024-04-08 ENCOUNTER — Other Ambulatory Visit: Payer: Self-pay | Admitting: Hematology

## 2024-04-08 LAB — CEA: CEA: 182 ng/mL — ABNORMAL HIGH (ref 0.0–4.7)

## 2024-04-09 ENCOUNTER — Inpatient Hospital Stay

## 2024-04-09 ENCOUNTER — Other Ambulatory Visit: Payer: Self-pay

## 2024-04-09 VITALS — BP 132/86 | HR 75 | Temp 96.9°F | Resp 18

## 2024-04-09 DIAGNOSIS — Z5112 Encounter for antineoplastic immunotherapy: Secondary | ICD-10-CM | POA: Diagnosis not present

## 2024-04-09 DIAGNOSIS — C180 Malignant neoplasm of cecum: Secondary | ICD-10-CM

## 2024-04-09 DIAGNOSIS — Z95828 Presence of other vascular implants and grafts: Secondary | ICD-10-CM

## 2024-04-09 DIAGNOSIS — C7972 Secondary malignant neoplasm of left adrenal gland: Secondary | ICD-10-CM | POA: Diagnosis not present

## 2024-04-09 DIAGNOSIS — C7802 Secondary malignant neoplasm of left lung: Secondary | ICD-10-CM | POA: Diagnosis not present

## 2024-04-09 DIAGNOSIS — C7801 Secondary malignant neoplasm of right lung: Secondary | ICD-10-CM | POA: Diagnosis not present

## 2024-04-09 DIAGNOSIS — Z5111 Encounter for antineoplastic chemotherapy: Secondary | ICD-10-CM | POA: Diagnosis not present

## 2024-04-09 DIAGNOSIS — Z5189 Encounter for other specified aftercare: Secondary | ICD-10-CM | POA: Diagnosis not present

## 2024-04-09 MED ORDER — PEGFILGRASTIM-CBQV 6 MG/0.6ML ~~LOC~~ SOSY
6.0000 mg | PREFILLED_SYRINGE | Freq: Once | SUBCUTANEOUS | Status: AC
  Administered 2024-04-09: 6 mg via SUBCUTANEOUS
  Filled 2024-04-09: qty 0.6

## 2024-04-09 MED ORDER — HEPARIN SOD (PORK) LOCK FLUSH 100 UNIT/ML IV SOLN
500.0000 [IU] | Freq: Once | INTRAVENOUS | Status: AC | PRN
  Administered 2024-04-09: 500 [IU]

## 2024-04-09 MED ORDER — SODIUM CHLORIDE 0.9% FLUSH
10.0000 mL | INTRAVENOUS | Status: DC | PRN
  Administered 2024-04-09: 10 mL

## 2024-04-09 NOTE — Progress Notes (Signed)
Patients port flushed without difficulty. Good blood return noted with no bruising or swelling noted at site. Band aid applied. VSS with discharge and left in satisfactory condition with no s/s of distress noted. All follow ups as scheduled.  [name] [employer]

## 2024-04-11 ENCOUNTER — Other Ambulatory Visit: Payer: Self-pay | Admitting: Hematology

## 2024-04-11 DIAGNOSIS — R197 Diarrhea, unspecified: Secondary | ICD-10-CM

## 2024-04-11 DIAGNOSIS — C189 Malignant neoplasm of colon, unspecified: Secondary | ICD-10-CM

## 2024-04-11 DIAGNOSIS — C180 Malignant neoplasm of cecum: Secondary | ICD-10-CM

## 2024-04-14 ENCOUNTER — Ambulatory Visit

## 2024-04-14 ENCOUNTER — Encounter: Payer: Self-pay | Admitting: Hematology

## 2024-04-14 ENCOUNTER — Encounter (HOSPITAL_COMMUNITY): Payer: Self-pay | Admitting: Hematology

## 2024-04-14 ENCOUNTER — Ambulatory Visit: Admitting: Hematology

## 2024-04-14 ENCOUNTER — Other Ambulatory Visit

## 2024-04-16 ENCOUNTER — Encounter

## 2024-04-21 ENCOUNTER — Inpatient Hospital Stay: Admitting: Hematology

## 2024-04-21 ENCOUNTER — Inpatient Hospital Stay: Attending: Hematology

## 2024-04-21 ENCOUNTER — Inpatient Hospital Stay

## 2024-04-21 VITALS — BP 130/83 | HR 57 | Temp 98.3°F | Resp 18 | Wt 186.0 lb

## 2024-04-21 DIAGNOSIS — Z5189 Encounter for other specified aftercare: Secondary | ICD-10-CM | POA: Diagnosis not present

## 2024-04-21 DIAGNOSIS — C180 Malignant neoplasm of cecum: Secondary | ICD-10-CM | POA: Diagnosis not present

## 2024-04-21 DIAGNOSIS — C7801 Secondary malignant neoplasm of right lung: Secondary | ICD-10-CM | POA: Insufficient documentation

## 2024-04-21 DIAGNOSIS — Z5112 Encounter for antineoplastic immunotherapy: Secondary | ICD-10-CM | POA: Diagnosis not present

## 2024-04-21 DIAGNOSIS — C7802 Secondary malignant neoplasm of left lung: Secondary | ICD-10-CM | POA: Insufficient documentation

## 2024-04-21 DIAGNOSIS — Z95828 Presence of other vascular implants and grafts: Secondary | ICD-10-CM

## 2024-04-21 DIAGNOSIS — C7972 Secondary malignant neoplasm of left adrenal gland: Secondary | ICD-10-CM | POA: Insufficient documentation

## 2024-04-21 DIAGNOSIS — Z5111 Encounter for antineoplastic chemotherapy: Secondary | ICD-10-CM | POA: Diagnosis not present

## 2024-04-21 LAB — COMPREHENSIVE METABOLIC PANEL WITH GFR
ALT: 13 U/L (ref 0–44)
AST: 24 U/L (ref 15–41)
Albumin: 3.3 g/dL — ABNORMAL LOW (ref 3.5–5.0)
Alkaline Phosphatase: 107 U/L (ref 38–126)
Anion gap: 9 (ref 5–15)
BUN: 8 mg/dL (ref 8–23)
CO2: 24 mmol/L (ref 22–32)
Calcium: 8.6 mg/dL — ABNORMAL LOW (ref 8.9–10.3)
Chloride: 105 mmol/L (ref 98–111)
Creatinine, Ser: 0.9 mg/dL (ref 0.61–1.24)
GFR, Estimated: >60 mL/min (ref >60)
Glucose, Bld: 90 mg/dL (ref 70–99)
Potassium: 3.6 mmol/L (ref 3.5–5.1)
Sodium: 138 mmol/L (ref 135–145)
Total Bilirubin: 0.3 mg/dL (ref 0.0–1.2)
Total Protein: 6.9 g/dL (ref 6.5–8.1)

## 2024-04-21 LAB — CBC WITH DIFFERENTIAL/PLATELET
Abs Immature Granulocytes: 0.7 10*3/uL — ABNORMAL HIGH (ref 0.00–0.07)
Basophils Absolute: 0.1 10*3/uL (ref 0.0–0.1)
Basophils Relative: 1 %
Eosinophils Absolute: 0.1 10*3/uL (ref 0.0–0.5)
Eosinophils Relative: 1 %
HCT: 34.1 % — ABNORMAL LOW (ref 39.0–52.0)
Hemoglobin: 10.8 g/dL — ABNORMAL LOW (ref 13.0–17.0)
Lymphocytes Relative: 25 %
Lymphs Abs: 3.1 10*3/uL (ref 0.7–4.0)
MCH: 30.3 pg (ref 26.0–34.0)
MCHC: 31.7 g/dL (ref 30.0–36.0)
MCV: 95.8 fL (ref 80.0–100.0)
Metamyelocytes Relative: 5 %
Monocytes Absolute: 1 10*3/uL (ref 0.1–1.0)
Monocytes Relative: 8 %
Myelocytes: 1 %
Neutro Abs: 7.3 10*3/uL (ref 1.7–7.7)
Neutrophils Relative %: 59 %
Platelets: 133 10*3/uL — ABNORMAL LOW (ref 150–400)
RBC: 3.56 MIL/uL — ABNORMAL LOW (ref 4.22–5.81)
RDW: 17.8 % — ABNORMAL HIGH (ref 11.5–15.5)
WBC: 12.3 10*3/uL — ABNORMAL HIGH (ref 4.0–10.5)
nRBC: 0.3 % — ABNORMAL HIGH (ref 0.0–0.2)
nRBC: 1 /100{WBCs} — ABNORMAL HIGH

## 2024-04-21 LAB — URINALYSIS, DIPSTICK ONLY
Bilirubin Urine: NEGATIVE
Glucose, UA: NEGATIVE mg/dL
Hgb urine dipstick: NEGATIVE
Ketones, ur: NEGATIVE mg/dL
Leukocytes,Ua: NEGATIVE
Nitrite: NEGATIVE
Protein, ur: 100 mg/dL — AB
Specific Gravity, Urine: 1.018 (ref 1.005–1.030)
pH: 5 (ref 5.0–8.0)

## 2024-04-21 LAB — MAGNESIUM: Magnesium: 1.8 mg/dL (ref 1.7–2.4)

## 2024-04-21 MED ORDER — DEXAMETHASONE SODIUM PHOSPHATE 10 MG/ML IJ SOLN
10.0000 mg | Freq: Once | INTRAMUSCULAR | Status: AC
  Administered 2024-04-21: 10 mg via INTRAVENOUS
  Filled 2024-04-21: qty 1

## 2024-04-21 MED ORDER — FLUOROURACIL CHEMO INJECTION 2.5 GM/50ML
320.0000 mg/m2 | Freq: Once | INTRAVENOUS | Status: AC
  Administered 2024-04-21: 600 mg via INTRAVENOUS
  Filled 2024-04-21: qty 12

## 2024-04-21 MED ORDER — SODIUM CHLORIDE 0.9 % IV SOLN
1920.0000 mg/m2 | INTRAVENOUS | Status: DC
  Administered 2024-04-21: 3500 mg via INTRAVENOUS
  Filled 2024-04-21: qty 70

## 2024-04-21 MED ORDER — SODIUM CHLORIDE 0.9 % IV SOLN
Freq: Once | INTRAVENOUS | Status: AC
Start: 2024-04-21 — End: 2024-04-21

## 2024-04-21 MED ORDER — SODIUM CHLORIDE 0.9 % IV SOLN
150.0000 mg | Freq: Once | INTRAVENOUS | Status: AC
  Administered 2024-04-21: 150 mg via INTRAVENOUS
  Filled 2024-04-21: qty 150

## 2024-04-21 MED ORDER — ATROPINE SULFATE 1 MG/ML IV SOLN
1.0000 mg | Freq: Once | INTRAVENOUS | Status: AC
  Administered 2024-04-21: 1 mg via INTRAVENOUS
  Filled 2024-04-21: qty 1

## 2024-04-21 MED ORDER — PALONOSETRON HCL INJECTION 0.25 MG/5ML
0.2500 mg | Freq: Once | INTRAVENOUS | Status: AC
  Administered 2024-04-21: 0.25 mg via INTRAVENOUS
  Filled 2024-04-21: qty 5

## 2024-04-21 MED ORDER — SODIUM CHLORIDE 0.9 % IV SOLN
144.0000 mg/m2 | Freq: Once | INTRAVENOUS | Status: AC
  Administered 2024-04-21: 300 mg via INTRAVENOUS
  Filled 2024-04-21: qty 15

## 2024-04-21 MED ORDER — SODIUM CHLORIDE 0.9% FLUSH
10.0000 mL | Freq: Once | INTRAVENOUS | Status: AC
  Administered 2024-04-21: 10 mL via INTRAVENOUS

## 2024-04-21 MED ORDER — BEVACIZUMAB-AWWB CHEMO INJECTION 400 MG/16ML
5.0000 mg/kg | Freq: Once | INTRAVENOUS | Status: AC
  Administered 2024-04-21: 400 mg via INTRAVENOUS
  Filled 2024-04-21: qty 16

## 2024-04-21 MED ORDER — SODIUM CHLORIDE 0.9 % IV SOLN
400.0000 mg/m2 | Freq: Once | INTRAVENOUS | Status: AC
  Administered 2024-04-21: 780 mg via INTRAVENOUS
  Filled 2024-04-21: qty 39

## 2024-04-21 NOTE — Patient Instructions (Signed)
 [facility] [place] - A DEPT OF [place]  Discharge Instructions: Thank you for choosing [facility] to provide your oncology and hematology care.  If you have a lab appointment with the Cancer Center - please note that after April 8th, 2024, all labs will be drawn in the cancer center.  You do not have to check in or register with the main entrance as you have in the past but will complete your check-in in the cancer center.  Wear comfortable clothing and clothing appropriate for easy access to any Portacath or PICC line.   We strive to give you quality time with your provider. You may need to reschedule your appointment if you arrive late (15 or more minutes).  Arriving late affects you and other patients whose appointments are after yours.  Also, if you miss three or more appointments without notifying the office, you may be dismissed from the clinic at the provider's discretion.      For prescription refill requests, have your pharmacy contact our office and allow 72 hours for refills to be completed.    Today you received the following chemotherapy and/or immunotherapy agents MVASI /FOLFIRI   To help prevent nausea and vomiting after your treatment, we encourage you to take your nausea medication as directed.  BELOW ARE SYMPTOMS THAT SHOULD BE REPORTED IMMEDIATELY: *FEVER GREATER THAN 100.4 F (38 C) OR HIGHER *CHILLS OR SWEATING *NAUSEA AND VOMITING THAT IS NOT CONTROLLED WITH YOUR NAUSEA MEDICATION *UNUSUAL SHORTNESS OF BREATH *UNUSUAL BRUISING OR BLEEDING *URINARY PROBLEMS (pain or burning when urinating, or frequent urination) *BOWEL PROBLEMS (unusual diarrhea, constipation, pain near the anus) TENDERNESS IN MOUTH AND THROAT WITH OR WITHOUT PRESENCE OF ULCERS (sore throat, sores in mouth, or a toothache) UNUSUAL RASH, SWELLING OR PAIN  UNUSUAL VAGINAL DISCHARGE OR ITCHING   Items with * indicate a potential emergency and should be followed  up as soon as possible or go to the Emergency Department if any problems should occur.  Please show the CHEMOTHERAPY ALERT CARD or IMMUNOTHERAPY ALERT CARD at check-in to the Emergency Department and triage nurse.  Should you have questions after your visit or need to cancel or reschedule your appointment, please contact [facility] CANCER CTR [place] - A DEPT OF [facility] [phone]  and follow the prompts.  Office hours are 8:00 a.m. to 4:30 p.m. Monday - Friday. Please note that voicemails left after 4:00 p.m. may not be returned until the following business day.  We are closed weekends and major holidays. You have access to a nurse at all times for urgent questions. Please call the main number to the clinic [phone] and follow the prompts.  For any non-urgent questions, you may also contact your provider using MyChart. We now offer e-Visits for anyone 64 and older to request care online for non-urgent symptoms. For details visit [email or web address].   Also download the MyChart app! Go to the app store, search "MyChart", open the app, select [place], and log in with your MyChart username and password.

## 2024-04-21 NOTE — Progress Notes (Signed)
 Patient presents today for MVASI /Folfiri infusion. Patient is in satisfactory condition with no new complaints voiced.  Vital signs are stable.  Labs reviewed and all labs are within treatment parameters.  We will proceed with treatment per MD orders.    Treatment given today per MD orders. Tolerated infusion without adverse affects. Vital signs stable. No complaints at this time. Discharged from clinic ambulatory in stable condition. Alert and oriented x 3. F/U with [facility] as scheduled. 5FU ambulatory pump infusing with no alarms beeping.

## 2024-04-22 LAB — CEA: CEA: 193 ng/mL — ABNORMAL HIGH (ref 0.0–4.7)

## 2024-04-23 ENCOUNTER — Inpatient Hospital Stay

## 2024-04-23 VITALS — BP 154/98 | HR 64 | Temp 96.9°F | Resp 18

## 2024-04-23 DIAGNOSIS — C7802 Secondary malignant neoplasm of left lung: Secondary | ICD-10-CM | POA: Diagnosis not present

## 2024-04-23 DIAGNOSIS — Z95828 Presence of other vascular implants and grafts: Secondary | ICD-10-CM

## 2024-04-23 DIAGNOSIS — C180 Malignant neoplasm of cecum: Secondary | ICD-10-CM

## 2024-04-23 DIAGNOSIS — Z5189 Encounter for other specified aftercare: Secondary | ICD-10-CM | POA: Diagnosis not present

## 2024-04-23 DIAGNOSIS — Z5112 Encounter for antineoplastic immunotherapy: Secondary | ICD-10-CM | POA: Diagnosis not present

## 2024-04-23 DIAGNOSIS — C7972 Secondary malignant neoplasm of left adrenal gland: Secondary | ICD-10-CM | POA: Diagnosis not present

## 2024-04-23 DIAGNOSIS — Z5111 Encounter for antineoplastic chemotherapy: Secondary | ICD-10-CM | POA: Diagnosis not present

## 2024-04-23 DIAGNOSIS — C7801 Secondary malignant neoplasm of right lung: Secondary | ICD-10-CM | POA: Diagnosis not present

## 2024-04-23 MED ORDER — PEGFILGRASTIM-CBQV 6 MG/0.6ML ~~LOC~~ SOSY
6.0000 mg | PREFILLED_SYRINGE | Freq: Once | SUBCUTANEOUS | Status: AC
Start: 2024-04-23 — End: 2024-04-23
  Administered 2024-04-23: 6 mg via SUBCUTANEOUS
  Filled 2024-04-23: qty 0.6

## 2024-04-23 MED ORDER — SODIUM CHLORIDE 0.9% FLUSH
10.0000 mL | INTRAVENOUS | Status: DC | PRN
  Administered 2024-04-23: 10 mL

## 2024-04-23 MED ORDER — HEPARIN SOD (PORK) LOCK FLUSH 100 UNIT/ML IV SOLN
500.0000 [IU] | Freq: Once | INTRAVENOUS | Status: AC | PRN
  Administered 2024-04-23: 500 [IU]

## 2024-04-23 NOTE — Patient Instructions (Signed)

## 2024-04-23 NOTE — Progress Notes (Signed)
 Patient for chemotherapy pump disconnect with no complaints voiced.  Patients port flushed without difficulty.  Good blood return noted with no bruising or swelling noted at site.  Band aid applied.  VSS with discharge and left ambulatory with no s/s of distress noted.

## 2024-04-24 ENCOUNTER — Other Ambulatory Visit: Payer: Self-pay

## 2024-04-24 DIAGNOSIS — C189 Malignant neoplasm of colon, unspecified: Secondary | ICD-10-CM | POA: Diagnosis not present

## 2024-05-06 ENCOUNTER — Inpatient Hospital Stay

## 2024-05-06 ENCOUNTER — Inpatient Hospital Stay: Admitting: Hematology

## 2024-05-06 VITALS — BP 146/84 | HR 50 | Temp 97.8°F | Resp 17 | Ht 70.0 in | Wt 191.1 lb

## 2024-05-06 DIAGNOSIS — R030 Elevated blood-pressure reading, without diagnosis of hypertension: Secondary | ICD-10-CM

## 2024-05-06 DIAGNOSIS — Z5111 Encounter for antineoplastic chemotherapy: Secondary | ICD-10-CM | POA: Diagnosis not present

## 2024-05-06 DIAGNOSIS — Z95828 Presence of other vascular implants and grafts: Secondary | ICD-10-CM

## 2024-05-06 DIAGNOSIS — Z5189 Encounter for other specified aftercare: Secondary | ICD-10-CM | POA: Diagnosis not present

## 2024-05-06 DIAGNOSIS — C7802 Secondary malignant neoplasm of left lung: Secondary | ICD-10-CM | POA: Diagnosis not present

## 2024-05-06 DIAGNOSIS — C180 Malignant neoplasm of cecum: Secondary | ICD-10-CM

## 2024-05-06 DIAGNOSIS — C7801 Secondary malignant neoplasm of right lung: Secondary | ICD-10-CM | POA: Diagnosis not present

## 2024-05-06 DIAGNOSIS — Z5112 Encounter for antineoplastic immunotherapy: Secondary | ICD-10-CM | POA: Diagnosis not present

## 2024-05-06 DIAGNOSIS — C7972 Secondary malignant neoplasm of left adrenal gland: Secondary | ICD-10-CM | POA: Diagnosis not present

## 2024-05-06 LAB — COMPREHENSIVE METABOLIC PANEL WITH GFR
ALT: 10 U/L (ref 0–44)
AST: 21 U/L (ref 15–41)
Albumin: 3.1 g/dL — ABNORMAL LOW (ref 3.5–5.0)
Alkaline Phosphatase: 83 U/L (ref 38–126)
Anion gap: 10 (ref 5–15)
BUN: 5 mg/dL — ABNORMAL LOW (ref 8–23)
CO2: 22 mmol/L (ref 22–32)
Calcium: 8.5 mg/dL — ABNORMAL LOW (ref 8.9–10.3)
Chloride: 106 mmol/L (ref 98–111)
Creatinine, Ser: 1 mg/dL (ref 0.61–1.24)
GFR, Estimated: >60 mL/min (ref >60)
Glucose, Bld: 91 mg/dL (ref 70–99)
Potassium: 3.7 mmol/L (ref 3.5–5.1)
Sodium: 138 mmol/L (ref 135–145)
Total Bilirubin: 0.7 mg/dL (ref 0.0–1.2)
Total Protein: 6.7 g/dL (ref 6.5–8.1)

## 2024-05-06 LAB — CBC WITH DIFFERENTIAL/PLATELET
Abs Immature Granulocytes: 0.2 10*3/uL — ABNORMAL HIGH (ref 0.00–0.07)
Basophils Absolute: 0.1 10*3/uL (ref 0.0–0.1)
Basophils Relative: 1 %
Eosinophils Absolute: 0.3 10*3/uL (ref 0.0–0.5)
Eosinophils Relative: 3 %
HCT: 35.4 % — ABNORMAL LOW (ref 39.0–52.0)
Hemoglobin: 11 g/dL — ABNORMAL LOW (ref 13.0–17.0)
Lymphocytes Relative: 19 %
Lymphs Abs: 1.8 10*3/uL (ref 0.7–4.0)
MCH: 29.3 pg (ref 26.0–34.0)
MCHC: 31.1 g/dL (ref 30.0–36.0)
MCV: 94.1 fL (ref 80.0–100.0)
Metamyelocytes Relative: 2 %
Monocytes Absolute: 1.1 10*3/uL — ABNORMAL HIGH (ref 0.1–1.0)
Monocytes Relative: 11 %
Neutro Abs: 6.1 10*3/uL (ref 1.7–7.7)
Neutrophils Relative %: 64 %
Platelets: 165 10*3/uL (ref 150–400)
RBC: 3.76 MIL/uL — ABNORMAL LOW (ref 4.22–5.81)
RDW: 18.4 % — ABNORMAL HIGH (ref 11.5–15.5)
WBC: 9.6 10*3/uL (ref 4.0–10.5)
nRBC: 0 % (ref 0.0–0.2)

## 2024-05-06 LAB — URINALYSIS, DIPSTICK ONLY
Bilirubin Urine: NEGATIVE
Glucose, UA: NEGATIVE mg/dL
Hgb urine dipstick: NEGATIVE
Ketones, ur: NEGATIVE mg/dL
Leukocytes,Ua: NEGATIVE
Nitrite: NEGATIVE
Protein, ur: 30 mg/dL — AB
Specific Gravity, Urine: 1.01 (ref 1.005–1.030)
pH: 6 (ref 5.0–8.0)

## 2024-05-06 LAB — MAGNESIUM: Magnesium: 1.8 mg/dL (ref 1.7–2.4)

## 2024-05-06 MED ORDER — DEXAMETHASONE SODIUM PHOSPHATE 10 MG/ML IJ SOLN
10.0000 mg | Freq: Once | INTRAMUSCULAR | Status: AC
  Administered 2024-05-06: 10 mg via INTRAVENOUS
  Filled 2024-05-06: qty 1

## 2024-05-06 MED ORDER — SODIUM CHLORIDE 0.9 % IV SOLN
144.0000 mg/m2 | Freq: Once | INTRAVENOUS | Status: AC
  Administered 2024-05-06: 300 mg via INTRAVENOUS
  Filled 2024-05-06: qty 15

## 2024-05-06 MED ORDER — CLONIDINE HCL 0.1 MG PO TABS
0.2000 mg | ORAL_TABLET | Freq: Once | ORAL | Status: AC
  Administered 2024-05-06: 0.2 mg via ORAL
  Filled 2024-05-06: qty 2

## 2024-05-06 MED ORDER — SODIUM CHLORIDE 0.9 % IV SOLN
1920.0000 mg/m2 | INTRAVENOUS | Status: DC
  Administered 2024-05-06: 3500 mg via INTRAVENOUS
  Filled 2024-05-06: qty 70

## 2024-05-06 MED ORDER — SODIUM CHLORIDE 0.9 % IV SOLN: Freq: Once | INTRAVENOUS | Status: AC

## 2024-05-06 MED ORDER — FLUOROURACIL CHEMO INJECTION 2.5 GM/50ML
320.0000 mg/m2 | Freq: Once | INTRAVENOUS | Status: AC
  Administered 2024-05-06: 600 mg via INTRAVENOUS
  Filled 2024-05-06: qty 12

## 2024-05-06 MED ORDER — SODIUM CHLORIDE 0.9 % IV SOLN
5.0000 mg/kg | Freq: Once | INTRAVENOUS | Status: AC
  Administered 2024-05-06: 400 mg via INTRAVENOUS
  Filled 2024-05-06: qty 16

## 2024-05-06 MED ORDER — ATROPINE SULFATE 1 MG/ML IV SOLN
1.0000 mg | Freq: Once | INTRAVENOUS | Status: AC
  Administered 2024-05-06: 1 mg via INTRAVENOUS
  Filled 2024-05-06: qty 1

## 2024-05-06 MED ORDER — SODIUM CHLORIDE 0.9 % IV SOLN
150.0000 mg | Freq: Once | INTRAVENOUS | Status: AC
  Administered 2024-05-06: 150 mg via INTRAVENOUS
  Filled 2024-05-06: qty 150

## 2024-05-06 MED ORDER — SODIUM CHLORIDE 0.9% FLUSH: 10.0000 mL | INTRAVENOUS | Status: DC | PRN

## 2024-05-06 MED ORDER — SODIUM CHLORIDE 0.9 % IV SOLN
400.0000 mg/m2 | Freq: Once | INTRAVENOUS | Status: AC
  Administered 2024-05-06: 780 mg via INTRAVENOUS
  Filled 2024-05-06: qty 39

## 2024-05-06 MED ORDER — SODIUM CHLORIDE 0.9% FLUSH
10.0000 mL | Freq: Once | INTRAVENOUS | Status: AC
  Administered 2024-05-06: 10 mL via INTRAVENOUS

## 2024-05-06 MED ORDER — PALONOSETRON HCL INJECTION 0.25 MG/5ML
0.2500 mg | Freq: Once | INTRAVENOUS | Status: AC
  Administered 2024-05-06: 0.25 mg via INTRAVENOUS
  Filled 2024-05-06: qty 5

## 2024-05-06 NOTE — Patient Instructions (Signed)
 [facility] [place] - A DEPT OF [place]  Discharge Instructions: Thank you for choosing [facility] to provide your oncology and hematology care.  If you have a lab appointment with the Cancer Center - please note that after April 8th, 2024, all labs will be drawn in the cancer center.  You do not have to check in or register with the main entrance as you have in the past but will complete your check-in in the cancer center.  Wear comfortable clothing and clothing appropriate for easy access to any Portacath or PICC line.   We strive to give you quality time with your provider. You may need to reschedule your appointment if you arrive late (15 or more minutes).  Arriving late affects you and other patients whose appointments are after yours.  Also, if you miss three or more appointments without notifying the office, you may be dismissed from the clinic at the provider's discretion.      For prescription refill requests, have your pharmacy contact our office and allow 72 hours for refills to be completed.    Today you received the following chemotherapy and/or immunotherapy agents MVASI , Irinotecan  and adruicil.      To help prevent nausea and vomiting after your treatment, we encourage you to take your nausea medication as directed.  BELOW ARE SYMPTOMS THAT SHOULD BE REPORTED IMMEDIATELY: *FEVER GREATER THAN 100.4 F (38 C) OR HIGHER *CHILLS OR SWEATING *NAUSEA AND VOMITING THAT IS NOT CONTROLLED WITH YOUR NAUSEA MEDICATION *UNUSUAL SHORTNESS OF BREATH *UNUSUAL BRUISING OR BLEEDING *URINARY PROBLEMS (pain or burning when urinating, or frequent urination) *BOWEL PROBLEMS (unusual diarrhea, constipation, pain near the anus) TENDERNESS IN MOUTH AND THROAT WITH OR WITHOUT PRESENCE OF ULCERS (sore throat, sores in mouth, or a toothache) UNUSUAL RASH, SWELLING OR PAIN  UNUSUAL VAGINAL DISCHARGE OR ITCHING   Items with * indicate a potential emergency and  should be followed up as soon as possible or go to the Emergency Department if any problems should occur.  Please show the CHEMOTHERAPY ALERT CARD or IMMUNOTHERAPY ALERT CARD at check-in to the Emergency Department and triage nurse.  Should you have questions after your visit or need to cancel or reschedule your appointment, please contact [facility] CANCER CTR [place] - A DEPT OF [facility] [phone]  and follow the prompts.  Office hours are 8:00 a.m. to 4:30 p.m. Monday - Friday. Please note that voicemails left after 4:00 p.m. may not be returned until the following business day.  We are closed weekends and major holidays. You have access to a nurse at all times for urgent questions. Please call the main number to the clinic [phone] and follow the prompts.  For any non-urgent questions, you may also contact your provider using MyChart. We now offer e-Visits for anyone 69 and older to request care online for non-urgent symptoms. For details visit [email or web address].   Also download the MyChart app! Go to the app store, search "MyChart", open the app, select [place], and log in with your MyChart username and password.

## 2024-05-06 NOTE — Progress Notes (Signed)
 Elevated Blood pressures (see flowsheet) and oncologist made aware. Clonidine  0.2 mg x 1 and okay to tx per K.   Patient tolerated chemotherapy with no complaints voiced.  Side effects with management reviewed with understanding verbalized.  Port site clean and dry with no bruising or swelling noted at site.  Good blood return noted before and after administration of chemotherapy.  Chemo pump connected with no alarms noted. Patient left in satisfactory condition with VSS and no s/s of distress noted.

## 2024-05-07 ENCOUNTER — Other Ambulatory Visit: Payer: Self-pay

## 2024-05-07 LAB — CEA: CEA: 188 ng/mL — ABNORMAL HIGH (ref 0.0–4.7)

## 2024-05-08 ENCOUNTER — Other Ambulatory Visit: Payer: Self-pay | Admitting: *Deleted

## 2024-05-08 ENCOUNTER — Inpatient Hospital Stay

## 2024-05-08 VITALS — BP 151/95 | HR 62 | Temp 97.9°F | Resp 16

## 2024-05-08 DIAGNOSIS — C180 Malignant neoplasm of cecum: Secondary | ICD-10-CM | POA: Diagnosis not present

## 2024-05-08 DIAGNOSIS — Z5112 Encounter for antineoplastic immunotherapy: Secondary | ICD-10-CM | POA: Diagnosis not present

## 2024-05-08 DIAGNOSIS — C7802 Secondary malignant neoplasm of left lung: Secondary | ICD-10-CM | POA: Diagnosis not present

## 2024-05-08 DIAGNOSIS — C7801 Secondary malignant neoplasm of right lung: Secondary | ICD-10-CM | POA: Diagnosis not present

## 2024-05-08 DIAGNOSIS — Z5189 Encounter for other specified aftercare: Secondary | ICD-10-CM | POA: Diagnosis not present

## 2024-05-08 DIAGNOSIS — Z5111 Encounter for antineoplastic chemotherapy: Secondary | ICD-10-CM | POA: Diagnosis not present

## 2024-05-08 DIAGNOSIS — Z95828 Presence of other vascular implants and grafts: Secondary | ICD-10-CM

## 2024-05-08 DIAGNOSIS — C7972 Secondary malignant neoplasm of left adrenal gland: Secondary | ICD-10-CM | POA: Diagnosis not present

## 2024-05-08 MED ORDER — PROCHLORPERAZINE MALEATE 10 MG PO TABS
10.0000 mg | ORAL_TABLET | Freq: Four times a day (QID) | ORAL | 2 refills | Status: DC | PRN
Start: 2024-05-08 — End: 2024-08-21

## 2024-05-08 MED ORDER — SODIUM CHLORIDE 0.9% FLUSH
10.0000 mL | INTRAVENOUS | Status: DC | PRN
  Administered 2024-05-08: 10 mL

## 2024-05-08 MED ORDER — HEPARIN SOD (PORK) LOCK FLUSH 100 UNIT/ML IV SOLN
500.0000 [IU] | Freq: Once | INTRAVENOUS | Status: AC | PRN
  Administered 2024-05-08: 500 [IU]

## 2024-05-08 MED ORDER — PEGFILGRASTIM-CBQV 6 MG/0.6ML ~~LOC~~ SOSY
6.0000 mg | PREFILLED_SYRINGE | Freq: Once | SUBCUTANEOUS | Status: AC
  Administered 2024-05-08: 6 mg via SUBCUTANEOUS
  Filled 2024-05-08: qty 0.6

## 2024-05-08 NOTE — Progress Notes (Signed)
 Patient presents today for pump d/c. Per pt he has been feeling nauseous since his treatment on 05/06/24. MD and treatment team made aware. Per Margretta Shi, RN antiemetic prescription sent to his pharmacy. Pt updated and all questions answered.  Vital signs are stable. Port a cath site clean, dry, and intact. Port flushed with 10 mls of Normal Saline and 500 Units of Heparin . Needle removed intact. Band aid applied. Patient has no complaints at this time. Discharged from clinic ambulatory and in stable condition. Patient alert and oriented. All follow ups as scheduled.   Jeni Duling

## 2024-05-14 ENCOUNTER — Other Ambulatory Visit: Payer: Self-pay | Admitting: Hematology

## 2024-05-15 ENCOUNTER — Other Ambulatory Visit: Payer: Self-pay | Admitting: Hematology

## 2024-05-19 ENCOUNTER — Inpatient Hospital Stay (HOSPITAL_BASED_OUTPATIENT_CLINIC_OR_DEPARTMENT_OTHER): Admitting: Hematology

## 2024-05-19 ENCOUNTER — Inpatient Hospital Stay: Attending: Hematology

## 2024-05-19 ENCOUNTER — Inpatient Hospital Stay

## 2024-05-19 VITALS — BP 132/86 | Wt 180.3 lb

## 2024-05-19 VITALS — BP 129/78 | HR 57 | Resp 17

## 2024-05-19 DIAGNOSIS — C180 Malignant neoplasm of cecum: Secondary | ICD-10-CM | POA: Diagnosis not present

## 2024-05-19 DIAGNOSIS — C7801 Secondary malignant neoplasm of right lung: Secondary | ICD-10-CM | POA: Insufficient documentation

## 2024-05-19 DIAGNOSIS — Z79899 Other long term (current) drug therapy: Secondary | ICD-10-CM | POA: Diagnosis not present

## 2024-05-19 DIAGNOSIS — C7972 Secondary malignant neoplasm of left adrenal gland: Secondary | ICD-10-CM | POA: Insufficient documentation

## 2024-05-19 DIAGNOSIS — Z95828 Presence of other vascular implants and grafts: Secondary | ICD-10-CM

## 2024-05-19 DIAGNOSIS — Z5111 Encounter for antineoplastic chemotherapy: Secondary | ICD-10-CM | POA: Diagnosis not present

## 2024-05-19 DIAGNOSIS — C7802 Secondary malignant neoplasm of left lung: Secondary | ICD-10-CM | POA: Diagnosis not present

## 2024-05-19 DIAGNOSIS — E876 Hypokalemia: Secondary | ICD-10-CM | POA: Insufficient documentation

## 2024-05-19 DIAGNOSIS — Z5112 Encounter for antineoplastic immunotherapy: Secondary | ICD-10-CM | POA: Insufficient documentation

## 2024-05-19 DIAGNOSIS — Z87891 Personal history of nicotine dependence: Secondary | ICD-10-CM | POA: Diagnosis not present

## 2024-05-19 DIAGNOSIS — Z5189 Encounter for other specified aftercare: Secondary | ICD-10-CM | POA: Insufficient documentation

## 2024-05-19 DIAGNOSIS — D649 Anemia, unspecified: Secondary | ICD-10-CM | POA: Diagnosis not present

## 2024-05-19 LAB — COMPREHENSIVE METABOLIC PANEL WITH GFR
ALT: 9 U/L (ref 0–44)
AST: 20 U/L (ref 15–41)
Albumin: 3.3 g/dL — ABNORMAL LOW (ref 3.5–5.0)
Alkaline Phosphatase: 94 U/L (ref 38–126)
Anion gap: 8 (ref 5–15)
BUN: 14 mg/dL (ref 8–23)
CO2: 19 mmol/L — ABNORMAL LOW (ref 22–32)
Calcium: 8.7 mg/dL — ABNORMAL LOW (ref 8.9–10.3)
Chloride: 109 mmol/L (ref 98–111)
Creatinine, Ser: 1.05 mg/dL (ref 0.61–1.24)
GFR, Estimated: >60 mL/min (ref >60)
Glucose, Bld: 131 mg/dL — ABNORMAL HIGH (ref 70–99)
Potassium: 3.7 mmol/L (ref 3.5–5.1)
Sodium: 136 mmol/L (ref 135–145)
Total Bilirubin: 0.4 mg/dL (ref 0.0–1.2)
Total Protein: 7 g/dL (ref 6.5–8.1)

## 2024-05-19 LAB — CBC WITH DIFFERENTIAL/PLATELET
Abs Immature Granulocytes: 0.09 10*3/uL — ABNORMAL HIGH (ref 0.00–0.07)
Basophils Absolute: 0.1 10*3/uL (ref 0.0–0.1)
Basophils Relative: 1 %
Eosinophils Absolute: 0.1 10*3/uL (ref 0.0–0.5)
Eosinophils Relative: 1 %
HCT: 34.2 % — ABNORMAL LOW (ref 39.0–52.0)
Hemoglobin: 10.8 g/dL — ABNORMAL LOW (ref 13.0–17.0)
Immature Granulocytes: 1 %
Lymphocytes Relative: 22 %
Lymphs Abs: 1.9 10*3/uL (ref 0.7–4.0)
MCH: 30.1 pg (ref 26.0–34.0)
MCHC: 31.6 g/dL (ref 30.0–36.0)
MCV: 95.3 fL (ref 80.0–100.0)
Monocytes Absolute: 1.1 10*3/uL — ABNORMAL HIGH (ref 0.1–1.0)
Monocytes Relative: 12 %
Neutro Abs: 5.6 10*3/uL (ref 1.7–7.7)
Neutrophils Relative %: 63 %
Platelets: 176 10*3/uL (ref 150–400)
RBC: 3.59 MIL/uL — ABNORMAL LOW (ref 4.22–5.81)
RDW: 18.4 % — ABNORMAL HIGH (ref 11.5–15.5)
WBC: 8.8 10*3/uL (ref 4.0–10.5)
nRBC: 0 % (ref 0.0–0.2)

## 2024-05-19 LAB — URINALYSIS, DIPSTICK ONLY
Bilirubin Urine: NEGATIVE
Glucose, UA: NEGATIVE mg/dL
Hgb urine dipstick: NEGATIVE
Ketones, ur: NEGATIVE mg/dL
Leukocytes,Ua: NEGATIVE
Nitrite: NEGATIVE
Protein, ur: 30 mg/dL — AB
Specific Gravity, Urine: 1.018 (ref 1.005–1.030)
pH: 5 (ref 5.0–8.0)

## 2024-05-19 LAB — MAGNESIUM: Magnesium: 1.8 mg/dL (ref 1.7–2.4)

## 2024-05-19 MED ORDER — SODIUM CHLORIDE 0.9 % IV SOLN
400.0000 mg/m2 | Freq: Once | INTRAVENOUS | Status: AC
  Administered 2024-05-19: 780 mg via INTRAVENOUS
  Filled 2024-05-19: qty 39

## 2024-05-19 MED ORDER — SODIUM CHLORIDE 0.9 % IV SOLN
144.0000 mg/m2 | Freq: Once | INTRAVENOUS | Status: AC
  Administered 2024-05-19: 300 mg via INTRAVENOUS
  Filled 2024-05-19: qty 15

## 2024-05-19 MED ORDER — SODIUM CHLORIDE 0.9% FLUSH
10.0000 mL | Freq: Once | INTRAVENOUS | Status: AC
  Administered 2024-05-19: 10 mL via INTRAVENOUS

## 2024-05-19 MED ORDER — SODIUM CHLORIDE 0.9 % IV SOLN: Freq: Once | INTRAVENOUS | Status: AC

## 2024-05-19 MED ORDER — PALONOSETRON HCL INJECTION 0.25 MG/5ML
0.2500 mg | Freq: Once | INTRAVENOUS | Status: AC
  Administered 2024-05-19: 0.25 mg via INTRAVENOUS
  Filled 2024-05-19: qty 5

## 2024-05-19 MED ORDER — FLUOROURACIL CHEMO INJECTION 2.5 GM/50ML
320.0000 mg/m2 | Freq: Once | INTRAVENOUS | Status: AC
  Administered 2024-05-19: 600 mg via INTRAVENOUS
  Filled 2024-05-19: qty 12

## 2024-05-19 MED ORDER — DEXAMETHASONE SODIUM PHOSPHATE 10 MG/ML IJ SOLN
10.0000 mg | Freq: Once | INTRAMUSCULAR | Status: AC
  Administered 2024-05-19: 10 mg via INTRAVENOUS
  Filled 2024-05-19: qty 1

## 2024-05-19 MED ORDER — MEGESTROL ACETATE 400 MG/10ML PO SUSP: 400.0000 mg | Freq: Two times a day (BID) | ORAL | 3 refills | Status: DC

## 2024-05-19 MED ORDER — SODIUM CHLORIDE 0.9 % IV SOLN
150.0000 mg | Freq: Once | INTRAVENOUS | Status: AC
  Administered 2024-05-19: 150 mg via INTRAVENOUS
  Filled 2024-05-19: qty 150

## 2024-05-19 MED ORDER — SODIUM CHLORIDE 0.9 % IV SOLN
5.0000 mg/kg | Freq: Once | INTRAVENOUS | Status: AC
  Administered 2024-05-19: 400 mg via INTRAVENOUS
  Filled 2024-05-19: qty 16

## 2024-05-19 MED ORDER — SODIUM CHLORIDE 0.9 % IV SOLN
1920.0000 mg/m2 | INTRAVENOUS | Status: DC
  Administered 2024-05-19: 3500 mg via INTRAVENOUS
  Filled 2024-05-19: qty 70

## 2024-05-19 MED ORDER — ATROPINE SULFATE 1 MG/ML IV SOLN
1.0000 mg | Freq: Once | INTRAVENOUS | Status: AC
  Administered 2024-05-19: 1 mg via INTRAVENOUS
  Filled 2024-05-19: qty 1

## 2024-05-19 NOTE — Patient Instructions (Signed)
 [facility] at [facility] Discharge Instructions   You were seen and examined today by Dr. Cheree Cords.  He reviewed the results of your lab work which are normal/stable.   For diarrhea, you may take two diarrhea pills every 6 hours as needed for watery bowel movements.   We will proceed with your treatment today.   Return as scheduled.    Thank you for choosing [facility] at [facility] to provide your oncology and hematology care.  To afford each patient quality time with our provider, please arrive at least 15 minutes before your scheduled appointment time.   If you have a lab appointment with the Cancer Center please come in thru the Main Entrance and check in at the main information desk.  You need to re-schedule your appointment should you arrive 10 or more minutes late.  We strive to give you quality time with our providers, and arriving late affects you and other patients whose appointments are after yours.  Also, if you no show three or more times for appointments you may be dismissed from the clinic at the providers discretion.     Again, thank you for choosing [facility].  Our hope is that these requests will decrease the amount of time that you wait before being seen by our physicians.       _____________________________________________________________  Should you have questions after your visit to [facility], please contact our office at [phone] and follow the prompts.  Our office hours are 8:00 a.m. and 4:30 p.m. Monday - Friday.  Please note that voicemails left after 4:00 p.m. may not be returned until the following business day.  We are closed weekends and major holidays.  You do have access to a nurse 24-7, just call the main number to the clinic [phone] and do not press any options, hold on the line and a nurse will answer the phone.    For prescription refill requests, have your pharmacy  contact our office and allow 72 hours.    Due to Covid, you will need to wear a mask upon entering the hospital. If you do not have a mask, a mask will be given to you at the Main Entrance upon arrival. For doctor visits, patients may have 1 support person age 3 or older with them. For treatment visits, patients can not have anyone with them due to social distancing guidelines and our immunocompromised population.

## 2024-05-19 NOTE — Progress Notes (Signed)
 [facility] [address] [place] [address]    Clinic Day:  05/19/2024  Referring physician: Jolynn Needy, PA  Patient Care Team: Jolynn Needy, [name] as PCP - General (Physician Assistant) Paulett Boros, MD as Medical Oncologist (Medical Oncology) Gerhard Knuckles, RN as Oncology Nurse Navigator (Medical Oncology)   ASSESSMENT & PLAN:   Assessment: 1.  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 - 30 pound weight loss in the last couple of years, weight stable over the last 6 months. - CT chest with contrast on 12/16/2021 showed bulky left hilar mass measuring 8.1 x 7.5 cm.  Numerous bilateral lung nodules of varying sizes.  Left adrenal nodule measuring 2.8 x 2.2 cm.  Mediastinal and bilateral hilar adenopathy with the largest pretracheal node measuring 4.1 x 3.1 cm. - MRI of the brain from 01/04/2022 which was negative for metastatic disease. - CTAP from 01/03/2022 which showed isolated left adrenal metastasis with no other evidence of metastatic disease in the abdomen or pelvis. - Pathology of left lung biopsy which shows adenocarcinoma with necrosis.  CK20 positive and CDX2 positive but negative for CK7 indicating colonic primary. 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  HER2 negative.  TMB low.  MSI-stable.  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  Other targetable mutations negative. - FOLFIRI started on 02/22/2022, bevacizumab  added with cycle 4  - CT CAP (05/17/2022): Mediastinal and hilar lymph nodes have decreased in size.  Largest perihilar left lower lobe lung mass has decreased in size.  Right upper lobe mass slightly increased in size.  Other nodules are stable.  Left adrenal mass decreased to 1.3 cm from 2.8 cm. - CT scan showed mixed response as it was compared to CT from 12/16/2021.  He did not start chemotherapy until 02/22/2022. - Maintenance 5-FU and bevacizumab  from September 2023 through  08/29/2023 with progression - FOLFIRI and bevacizumab  started on 09/12/2023   2. Social/family history: - Lives by himself.  He paints houses for living. - He quit smoking 12 years back and started back again 1 and half year ago and smoked half pack per day.  He quit again about 1 week ago. - Father had cancer, type unknown to the patient.  Brother died of brain tumor.  3.  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  Margins negative.  No lymphovascular or perineural invasion. - Received 3 cycles of XELOX followed by Xeloda for total of 6 months.  Oxaliplatin discontinued during cycle 4 secondary to transaminitis, elevated bilirubin and thrombocytopenia.  However he was also treated for hep C with Harvoni after that.    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  Evidence of chronic liver disease with hepatosplenomegaly and varices.  No ascites.  Lower esophageal varices and gastric varices seen. - He is tolerating FOLFIRI and bevacizumab  reasonably well.  Denies any bleeding per rectum or melena. - He had diarrhea last week which was more severe.  He had to take Imodium  which helped some.  He was told to take Imodium  2 tablets at the first onset of diarrhea followed by 1 tablet after each watery bowel movement, limit 8/day. - I have recommended GI evaluation for EGD and banding.  Will make a referral to  Dr. Mordechai April. - Labs today: Normal LFTs.  CBC grossly normal with anemia from myelosuppression.  CEA is 188, down from 183. - Continue FOLFIRI and bevacizumab  every 2 weeks.  RTC 6 weeks for follow-up.  Plan to repeat CT CAP prior to next visit.   2.  Lower rib/epigastric pain: - Continue oxycodone  10 mg every 6 hours as needed.  Pain is well-controlled.  3.  Difficulty falling asleep: - Continue trazodone  daily  as needed.  He is not requiring lately.  4.  Hypertension: - Continue amlodipine  10 mg daily and lisinopril  10 mg in the evenings.   5.  Hypokalemia: - Continue K-Dur 20 milliequivalents daily.  Potassium is normal today.  6.  Weight loss: - He lost about 6 pounds since last visit.  Reports decrease in appetite.  We will start him back on Megace  twice daily which has helped him in the past.    Orders Placed This Encounter  Procedures   CT CHEST ABDOMEN PELVIS W CONTRAST    Standing Status:   Future    Expected Date:   06/18/2024    Expiration Date:   05/19/2025    If indicated for the ordered procedure, I authorize the administration of contrast media per Radiology protocol:   Yes    Does the patient have a contrast media/X-ray dye allergy?:   No    Preferred imaging location?:   [facility]    Release to patient:   Immediate    If indicated for the ordered procedure, I authorize the administration of oral contrast media per Radiology protocol:   Yes      I,Helena R Teague,acting as a scribe for Paulett Boros, MD.,have documented all relevant documentation on the behalf of Paulett Boros, MD,as directed by  Paulett Boros, MD while in the presence of Paulett Boros, MD.  I, Paulett Boros MD, have reviewed the above documentation for accuracy and completeness, and I agree with the above.    Paulett Boros, MD   6/9/202510:24 AM  CHIEF COMPLAINT:   Diagnosis: metastatic colon cancer to the lungs and left adrenal gland    Cancer Staging  Cecal cancer [employer]) Staging form: Colon and Rectum, AJCC 8th Edition - Clinical stage from 10/07/2019: Stage IIIB (cT3, cN1, cM0) - Signed by Paulett Boros, MD on 10/07/2019 - Pathologic stage from 01/23/2022: Stage IVB (rpTX, pN0, pM1b) - Unsigned    Prior Therapy: 1. FOLFIRI 02/22/22 - 08/08/22  2. Maintenance 5-FU and bevacizumab , 08/2022 - 08/29/23  Current Therapy:  FOLFIRI and bevacizumab      HISTORY OF PRESENT ILLNESS:   Oncology History  Cecal cancer (HCC)  10/07/2019 Initial Diagnosis   Cecal cancer (HCC)   10/07/2019 Cancer Staging   Staging form: Colon and Rectum, AJCC 8th Edition - Clinical stage from 10/07/2019: Stage IIIB (cT3, cN1, cM0) - Signed by Paulett Boros, MD on 10/07/2019   02/22/2022 - 08/08/2022 Chemotherapy   Patient is on Treatment Plan : COLORECTAL FOLFIRI / BEVACIZUMAB  Q14D     02/22/2022 -  Chemotherapy   Patient is on Treatment Plan : COLORECTAL FOLFIRI + Bevacizumab  q14d        INTERVAL HISTORY:   Evan [name] is a 68 y.o. male presenting to clinic today for follow up of metastatic colon cancer to the lungs and left adrenal gland. He was last seen by me on 04/07/24.  Today, he states that he is doing well overall. His appetite level is at 100%. His energy level is at 100%.  PAST  MEDICAL HISTORY:   Past Medical History: Past Medical History:  Diagnosis Date   Arthritis    Colon cancer (HCC)    colon   Hepatitis C    Hypertension    Port-A-Cath in place 02/15/2022    Surgical History: Past Surgical History:  Procedure Laterality Date   BIOPSY  03/12/2023   Procedure: BIOPSY;  Surgeon: Vinetta Greening, DO;  Location: [facility];  Service: Endoscopy;;   COLON SURGERY     ESOPHAGEAL BANDING N/A 03/12/2023   Procedure: ESOPHAGEAL BANDING;  Surgeon: Vinetta Greening, DO;  Location: [facility];  Service: Endoscopy;  Laterality: N/A;   ESOPHAGOGASTRODUODENOSCOPY (EGD) WITH PROPOFOL  N/A 03/12/2023   Procedure: ESOPHAGOGASTRODUODENOSCOPY (EGD) WITH PROPOFOL ;  Surgeon: Vinetta Greening, DO;  Location: [facility];  Service: Endoscopy;  Laterality: N/A;  11:30AM; ASA 3   PORTACATH PLACEMENT Right 02/08/2022   Procedure: INSERTION PORT-A-CATH- RIJ;  Surgeon: Marijo Shove, DO;  Location: [facility];  Service: General;  Laterality: Right;   REPLACEMENT TOTAL KNEE Left    SHOULDER ARTHROSCOPY Bilateral    TOTAL HIP ARTHROPLASTY  Right 11/09/2020   Procedure: TOTAL HIP ARTHROPLASTY ANTERIOR APPROACH;  Surgeon: Saundra Curl, MD;  Location: [facility];  Service: Orthopedics;  Laterality: Right;   WRIST SURGERY Left     Social History: Social History   Socioeconomic History   Marital status: Married    Spouse name: Not on file   Number of children: 7   Years of education: Not on file   Highest education level: Not on file  Occupational History   Occupation: employed  Tobacco Use   Smoking status: Former    Current packs/day: 0.00    Average packs/day: 1.5 packs/day for 20.0 years (30.0 ttl pk-yrs)    Types: Cigarettes    Start date: 11/19/1985    Quit date: 11/19/2005    Years since quitting: 18.5   Smokeless tobacco: Never  Vaping Use   Vaping status: Never Used  Substance and Sexual Activity   Alcohol  use: Not Currently   Drug use: Never   Sexual activity: Yes  Other Topics Concern   Not on file  Social History Narrative   ** Merged History Encounter **       Separated from wife 11/2021   Social Drivers of Health   Financial Resource Strain: Low Risk  (10/07/2019)   Overall Financial Resource Strain (CARDIA)    Difficulty of Paying Living Expenses: Not very hard  Food Insecurity: No Food Insecurity (10/07/2019)   Hunger Vital Sign    Worried About Running Out of Food in the Last Year: Never true    Ran Out of Food in the Last Year: Never true  Transportation Needs: No Transportation Needs (10/07/2019)   PRAPARE - Administrator, Civil Service (Medical): No    Lack of Transportation (Non-Medical): No  Physical Activity: Inactive (10/07/2019)   Exercise Vital Sign    Days of Exercise per Week: 0 days    Minutes of Exercise per Session: 0 min  Stress: No Stress Concern Present (10/07/2019)   [employer] of Occupational Health - Occupational Stress Questionnaire    Feeling of Stress : Not at all  Social Connections: Moderately Integrated (10/07/2019)   Social Connection  and Isolation Panel [NHANES]    Frequency of Communication with Friends and Family: Once a week    Frequency of Social Gatherings with Friends and Family: Once a week    Attends Religious  Services: More than 4 times per year    Active Member of Clubs or Organizations: Yes    Attends Banker Meetings: More than 4 times per year    Marital Status: Married  Catering manager Violence: Not At Risk (10/07/2019)   Humiliation, Afraid, Rape, and Kick questionnaire    Fear of Current or Ex-Partner: No    Emotionally Abused: No    Physically Abused: No    Sexually Abused: No    Family History: Family History  Problem Relation Age of Onset   Heart disease Mother    Dementia Father    Heart disease Sister    Cancer Brother    Hypertension Brother    Hypertension Brother    Healthy Son    Healthy Son    Healthy Son    Healthy Son    Healthy Daughter    Healthy Daughter    Healthy Daughter     Current Medications:  Current Outpatient Medications:    aluminum -magnesium  hydroxide-simethicone  (MAALOX) 200-200-20 MG/5ML SUSP, Take 30 mLs by mouth 4 (four) times daily -  before meals and at bedtime., Disp: 1680 mL, Rfl: 2   amLODipine  (NORVASC ) 10 MG tablet, Take 1 tablet (10 mg total) by mouth daily., Disp: 90 tablet, Rfl: 3   Bevacizumab  (AVASTIN  IV), Inject into the vein every 14 (fourteen) days. *start date TBD, Disp: , Rfl:    ENULOSE  10 GM/15ML SOLN, Take 10 g by mouth daily as needed., Disp: , Rfl:    fluorouracil  CALGB 13086 2,400 mg/m2 in sodium chloride  0.9 % 150 mL, Inject 2,400 mg/m2 into the vein over 48 hr., Disp: , Rfl:    FLUOROURACIL  IV, Inject into the vein every 14 (fourteen) days., Disp: , Rfl:    Lactulose  20 GM/30ML SOLN, Take 15 mLs (10 g total) by mouth at bedtime. Take 15 ml at bedtime every night to assist with regular bowel movements.  Titrate down if having multiple bowel movements.  If a bowel movement has not occurred in 3 to 4 days or longer, then  take 15 ml every 3 hours until a bowel movent has occurred., Disp: 450 mL, Rfl: 5   LEUCOVORIN  CALCIUM  IV, Inject into the vein every 14 (fourteen) days., Disp: , Rfl:    lidocaine -prilocaine  (EMLA ) cream, Apply 1 Application topically as needed (Apply to port site 30 mins- 1 hour prior to treatment/labs.)., Disp: 30 g, Rfl: 0   lisinopril  (ZESTRIL ) 10 MG tablet, TAKE ONE TABLET BY MOUTH DAILY, Disp: 30 tablet, Rfl: 3   loperamide  (IMODIUM ) 2 MG capsule, TAKE 2 CAPSULES BY MOUTH AFTER FIRST LOOSE STOOL,AND THEN 1 CAPSULE AFTER 2ND LOOSE STOOL. DO NOT EXCEED 8 CAPS IN 24 HOURS, Disp: 30 capsule, Rfl: 0   megestrol  (MEGACE ) 400 MG/10ML suspension, Take 10 mLs (400 mg total) by mouth 2 (two) times daily., Disp: 480 mL, Rfl: 3   Misc. Devices MISC, Please provide patient with 1:1 ratio of magic mouthwash and lidocaine  to swish and swallow QID, Disp: 1 each, Rfl: 3   naloxone  (NARCAN ) nasal spray 4 mg/0.1 mL, 0.1ml nasal spray in one nostril. May repeat the dose in other nostril in 2-3 minutes if needed., Disp: 2 each, Rfl: 3   Oxycodone  HCl 10 MG TABS, Take 1 tablet (10 mg total) by mouth every 4 (four) hours as needed., Disp: 180 tablet, Rfl: 0   pantoprazole  (PROTONIX ) 40 MG tablet, Take 1 tablet (40 mg total) by mouth daily., Disp: 30 tablet, Rfl: 11  potassium chloride  SA (KLOR-CON  M) 20 MEQ tablet, Take 1 tablet (20 mEq total) by mouth daily., Disp: 90 tablet, Rfl: 4   prochlorperazine  (COMPAZINE ) 10 MG tablet, Take 1 tablet (10 mg total) by mouth every 6 (six) hours as needed (NAUSEA)., Disp: 60 tablet, Rfl: 2 No current facility-administered medications for this visit.  Facility-Administered Medications Ordered in Other Visits:    potassium chloride  SA (KLOR-CON  M) CR tablet 40 mEq, 40 mEq, Oral, Once, Paulett Boros, MD   Allergies: No Known Allergies  REVIEW OF SYSTEMS:   Review of Systems  Constitutional:  Negative for chills, fatigue and fever.  HENT:   Negative for lump/mass,  mouth sores, nosebleeds, sore throat and trouble swallowing.   Eyes:  Negative for eye problems.  Respiratory:  Negative for cough and shortness of breath.   Cardiovascular:  Negative for chest pain, leg swelling and palpitations.  Gastrointestinal:  Positive for diarrhea. Negative for abdominal pain, constipation, nausea and vomiting.  Genitourinary:  Negative for bladder incontinence, difficulty urinating, dysuria, frequency, hematuria and nocturia.   Musculoskeletal:  Negative for arthralgias, back pain, flank pain, myalgias and neck pain.  Skin:  Negative for itching and rash.  Neurological:  Negative for dizziness, headaches and numbness.  Hematological:  Does not bruise/bleed easily.  Psychiatric/Behavioral:  Negative for depression, sleep disturbance and suicidal ideas. The patient is not nervous/anxious.   All other systems reviewed and are negative.    VITALS:   Blood pressure 132/86, weight 180 lb 5.4 oz (81.8 kg).  Wt Readings from Last 3 Encounters:  05/19/24 180 lb 5.4 oz (81.8 kg)  05/06/24 191 lb 1.6 oz (86.7 kg)  04/21/24 186 lb (84.4 kg)    Body mass index is 25.88 kg/m.  Performance status (ECOG): 1 - Symptomatic but completely ambulatory  PHYSICAL EXAM:   Physical Exam Vitals and nursing note reviewed. Exam conducted with a chaperone present.  Constitutional:      Appearance: Normal appearance.  Cardiovascular:     Rate and Rhythm: Normal rate and regular rhythm.     Pulses: Normal pulses.     Heart sounds: Normal heart sounds.  Pulmonary:     Effort: Pulmonary effort is normal.     Breath sounds: Normal breath sounds.  Abdominal:     Palpations: Abdomen is soft. There is no hepatomegaly, splenomegaly or mass.     Tenderness: There is no abdominal tenderness.  Musculoskeletal:     Right lower leg: No edema.     Left lower leg: No edema.  Lymphadenopathy:     Cervical: No cervical adenopathy.     Right cervical: No superficial, deep or posterior  cervical adenopathy.    Left cervical: No superficial, deep or posterior cervical adenopathy.     Upper Body:     Right upper body: No supraclavicular or axillary adenopathy.     Left upper body: No supraclavicular or axillary adenopathy.  Neurological:     General: No focal deficit present.     Mental Status: He is alert and oriented to person, place, and time.  Psychiatric:        Mood and Affect: Mood normal.        Behavior: Behavior normal.     LABS:   CBC     Component Value Date/Time   WBC 8.8 05/19/2024 0904   RBC 3.59 (L) 05/19/2024 0904   HGB 10.8 (L) 05/19/2024 0904   HCT 34.2 (L) 05/19/2024 0904   PLT 176 05/19/2024 0904  MCV 95.3 05/19/2024 0904   MCH 30.1 05/19/2024 0904   MCHC 31.6 05/19/2024 0904   RDW 18.4 (H) 05/19/2024 0904   LYMPHSABS 1.9 05/19/2024 0904   MONOABS 1.1 (H) 05/19/2024 0904   EOSABS 0.1 05/19/2024 0904   BASOSABS 0.1 05/19/2024 0904    CMP      Component Value Date/Time   NA 136 05/19/2024 0904   K 3.7 05/19/2024 0904   CL 109 05/19/2024 0904   CO2 19 (L) 05/19/2024 0904   GLUCOSE 131 (H) 05/19/2024 0904   BUN 14 05/19/2024 0904   CREATININE 1.05 05/19/2024 0904   CALCIUM  8.7 (L) 05/19/2024 0904   PROT 7.0 05/19/2024 0904   ALBUMIN 3.3 (L) 05/19/2024 0904   AST 20 05/19/2024 0904   ALT 9 05/19/2024 0904   ALKPHOS 94 05/19/2024 0904   BILITOT 0.4 05/19/2024 0904   GFRNONAA >60 05/19/2024 0904     Lab Results  Component Value Date   CEA1 188.0 (H) 05/06/2024   /  CEA  Date Value Ref Range Status  05/06/2024 188.0 (H) 0.0 - 4.7 ng/mL Final    Comment:    (NOTE)                             Nonsmokers          <3.9                             Smokers             <5.6 Roche [employer] Electrochemiluminescence Immunoassay (ECLIA) Values obtained with different assay methods or kits cannot be used interchangeably.  Results cannot be interpreted as absolute evidence of the presence or absence of malignant  disease. Performed At: [facility] [address] [place], [place] [id number] Pearlean Botts MD WU:[id number]    No results found for: "PSA1" No results found for: "CAN199" No results found for: "CAN125"  No results found for: "TOTALPROTELP", "ALBUMINELP", "A1GS", "A2GS", "BETS", "BETA2SER", "GAMS", "MSPIKE", "SPEI" Lab Results  Component Value Date   TIBC 406 01/16/2023   TIBC 294 06/06/2022   TIBC 237 (L) 02/22/2022   FERRITIN 150 01/16/2023   FERRITIN 243 06/06/2022   FERRITIN 292 02/22/2022   IRONPCTSAT 18 01/16/2023   IRONPCTSAT 24 06/06/2022   IRONPCTSAT 11 (L) 02/22/2022   Lab Results  Component Value Date   LDH 258 (H) 12/26/2021     STUDIES:   No results found.

## 2024-05-19 NOTE — Progress Notes (Signed)
 Patient tolerated chemotherapy with no complaints voiced.  Side effects with management reviewed with understanding verbalized.  Port site clean and dry with no bruising or swelling noted at site.  Good blood return noted before and after administration of chemotherapy.  5FU pump started for home use. Pt due to return to clinic on 05/21/24 for pump stop. Patient left in satisfactory condition with VSS and no s/s of distress noted. All follows up as scheduled.  Jamicah Anstead [employer]

## 2024-05-19 NOTE — Progress Notes (Signed)
 Patient has been examined by Dr. Ellin Saba. Vital signs and labs have been reviewed by MD - ANC, Creatinine, LFTs, hemoglobin, and platelets are within treatment parameters per M.D. - pt may proceed with treatment.  Primary RN and pharmacy notified.

## 2024-05-19 NOTE — Patient Instructions (Signed)
 [facility] [place] - A DEPT OF [facility]  Discharge Instructions: Thank you for choosing [facility] to provide your oncology and hematology care.  If you have a lab appointment with the Cancer Center - please note that after April 8th, 2024, all labs will be drawn in the cancer center.  You do not have to check in or register with the main entrance as you have in the past but will complete your check-in in the cancer center.  Wear comfortable clothing and clothing appropriate for easy access to any Portacath or PICC line.   We strive to give you quality time with your provider. You may need to reschedule your appointment if you arrive late (15 or more minutes).  Arriving late affects you and other patients whose appointments are after yours.  Also, if you miss three or more appointments without notifying the office, you may be dismissed from the clinic at the provider's discretion.      For prescription refill requests, have your pharmacy contact our office and allow 72 hours for refills to be completed.    Today you received the following chemotherapy and/or immunotherapy agents mvasi , irinotecan , leucovorin , adrucil    To help prevent nausea and vomiting after your treatment, we encourage you to take your nausea medication as directed.  BELOW ARE SYMPTOMS THAT SHOULD BE REPORTED IMMEDIATELY: *FEVER GREATER THAN 100.4 F (38 C) OR HIGHER *CHILLS OR SWEATING *NAUSEA AND VOMITING THAT IS NOT CONTROLLED WITH YOUR NAUSEA MEDICATION *UNUSUAL SHORTNESS OF BREATH *UNUSUAL BRUISING OR BLEEDING *URINARY PROBLEMS (pain or burning when urinating, or frequent urination) *BOWEL PROBLEMS (unusual diarrhea, constipation, pain near the anus) TENDERNESS IN MOUTH AND THROAT WITH OR WITHOUT PRESENCE OF ULCERS (sore throat, sores in mouth, or a toothache) UNUSUAL RASH, SWELLING OR PAIN  UNUSUAL VAGINAL DISCHARGE OR ITCHING   Items with * indicate a potential emergency  and should be followed up as soon as possible or go to the Emergency Department if any problems should occur.  Please show the CHEMOTHERAPY ALERT CARD or IMMUNOTHERAPY ALERT CARD at check-in to the Emergency Department and triage nurse.  Should you have questions after your visit or need to cancel or reschedule your appointment, please contact [facility] [place] - A DEPT OF [facility] [phone]  and follow the prompts.  Office hours are 8:00 a.m. to 4:30 p.m. Monday - Friday. Please note that voicemails left after 4:00 p.m. may not be returned until the following business day.  We are closed weekends and major holidays. You have access to a nurse at all times for urgent questions. Please call the main number to the clinic [phone] and follow the prompts.  For any non-urgent questions, you may also contact your provider using MyChart. We now offer e-Visits for anyone 55 and older to request care online for non-urgent symptoms. For details visit [email or web address].   Also download the MyChart app! Go to the app store, search "MyChart", open the app, select [facility], and log in with your MyChart username and password.

## 2024-05-20 ENCOUNTER — Other Ambulatory Visit: Payer: Self-pay

## 2024-05-20 LAB — CEA: CEA: 192 ng/mL — ABNORMAL HIGH (ref 0.0–4.7)

## 2024-05-21 ENCOUNTER — Inpatient Hospital Stay

## 2024-05-21 VITALS — BP 139/88 | HR 66 | Temp 98.4°F | Resp 16

## 2024-05-21 DIAGNOSIS — Z87891 Personal history of nicotine dependence: Secondary | ICD-10-CM | POA: Diagnosis not present

## 2024-05-21 DIAGNOSIS — Z79899 Other long term (current) drug therapy: Secondary | ICD-10-CM | POA: Diagnosis not present

## 2024-05-21 DIAGNOSIS — Z5111 Encounter for antineoplastic chemotherapy: Secondary | ICD-10-CM | POA: Diagnosis not present

## 2024-05-21 DIAGNOSIS — Z5112 Encounter for antineoplastic immunotherapy: Secondary | ICD-10-CM | POA: Diagnosis not present

## 2024-05-21 DIAGNOSIS — C7802 Secondary malignant neoplasm of left lung: Secondary | ICD-10-CM | POA: Diagnosis not present

## 2024-05-21 DIAGNOSIS — E876 Hypokalemia: Secondary | ICD-10-CM | POA: Diagnosis not present

## 2024-05-21 DIAGNOSIS — C180 Malignant neoplasm of cecum: Secondary | ICD-10-CM | POA: Diagnosis not present

## 2024-05-21 DIAGNOSIS — C7801 Secondary malignant neoplasm of right lung: Secondary | ICD-10-CM | POA: Diagnosis not present

## 2024-05-21 DIAGNOSIS — Z95828 Presence of other vascular implants and grafts: Secondary | ICD-10-CM

## 2024-05-21 DIAGNOSIS — Z5189 Encounter for other specified aftercare: Secondary | ICD-10-CM | POA: Diagnosis not present

## 2024-05-21 DIAGNOSIS — C7972 Secondary malignant neoplasm of left adrenal gland: Secondary | ICD-10-CM | POA: Diagnosis not present

## 2024-05-21 DIAGNOSIS — D649 Anemia, unspecified: Secondary | ICD-10-CM | POA: Diagnosis not present

## 2024-05-21 MED ORDER — HEPARIN SOD (PORK) LOCK FLUSH 100 UNIT/ML IV SOLN
500.0000 [IU] | Freq: Once | INTRAVENOUS | Status: AC | PRN
  Administered 2024-05-21: 500 [IU]

## 2024-05-21 MED ORDER — SODIUM CHLORIDE 0.9% FLUSH
10.0000 mL | INTRAVENOUS | Status: DC | PRN
  Administered 2024-05-21: 10 mL

## 2024-05-21 MED ORDER — PEGFILGRASTIM-CBQV 6 MG/0.6ML ~~LOC~~ SOSY
6.0000 mg | PREFILLED_SYRINGE | Freq: Once | SUBCUTANEOUS | Status: AC
  Administered 2024-05-21: 6 mg via SUBCUTANEOUS
  Filled 2024-05-21: qty 0.6

## 2024-05-21 NOTE — Progress Notes (Signed)
 Patient tolerated injection with no complaints voiced. Site clean and dry with no bruising or swelling noted at site. See MAR for details. Band aid applied.  Patient stable during and after injection.  Chemo pump disconnected with no alarms noted. Port flushed with good blood return noted. No bruising or swelling at site. Bandaid applied and patient discharged in satisfactory condition. VVS stable with no signs or symptoms of distressed noted.

## 2024-05-21 NOTE — Patient Instructions (Signed)
 [facility] [place] - A DEPT OF [facility]  Discharge Instructions: Thank you for choosing [facility] to provide your oncology and hematology care.  If you have a lab appointment with the Cancer Center - please note that after April 8th, 2024, all labs will be drawn in the cancer center.  You do not have to check in or register with the main entrance as you have in the past but will complete your check-in in the cancer center.  Wear comfortable clothing and clothing appropriate for easy access to any Portacath or PICC line.   We strive to give you quality time with your provider. You may need to reschedule your appointment if you arrive late (15 or more minutes).  Arriving late affects you and other patients whose appointments are after yours.  Also, if you miss three or more appointments without notifying the office, you may be dismissed from the clinic at the provider's discretion.      For prescription refill requests, have your pharmacy contact our office and allow 72 hours for refills to be completed.    Today you received the following Udencya, and chemo pump disconnected, return as scheduled.   To help prevent nausea and vomiting after your treatment, we encourage you to take your nausea medication as directed.  BELOW ARE SYMPTOMS THAT SHOULD BE REPORTED IMMEDIATELY: *FEVER GREATER THAN 100.4 F (38 C) OR HIGHER *CHILLS OR SWEATING *NAUSEA AND VOMITING THAT IS NOT CONTROLLED WITH YOUR NAUSEA MEDICATION *UNUSUAL SHORTNESS OF BREATH *UNUSUAL BRUISING OR BLEEDING *URINARY PROBLEMS (pain or burning when urinating, or frequent urination) *BOWEL PROBLEMS (unusual diarrhea, constipation, pain near the anus) TENDERNESS IN MOUTH AND THROAT WITH OR WITHOUT PRESENCE OF ULCERS (sore throat, sores in mouth, or a toothache) UNUSUAL RASH, SWELLING OR PAIN  UNUSUAL VAGINAL DISCHARGE OR ITCHING   Items with * indicate a potential emergency and should be  followed up as soon as possible or go to the Emergency Department if any problems should occur.  Please show the CHEMOTHERAPY ALERT CARD or IMMUNOTHERAPY ALERT CARD at check-in to the Emergency Department and triage nurse.  Should you have questions after your visit or need to cancel or reschedule your appointment, please contact [facility] [place] - A DEPT OF [facility] [phone]  and follow the prompts.  Office hours are 8:00 a.m. to 4:30 p.m. Monday - Friday. Please note that voicemails left after 4:00 p.m. may not be returned until the following business day.  We are closed weekends and major holidays. You have access to a nurse at all times for urgent questions. Please call the main number to the clinic [phone] and follow the prompts.  For any non-urgent questions, you may also contact your provider using MyChart. We now offer e-Visits for anyone 17 and older to request care online for non-urgent symptoms. For details visit [email or web address].   Also download the MyChart app! Go to the app store, search MyChart, open the app, select [place], and log in with your MyChart username and password.

## 2024-05-25 DIAGNOSIS — C189 Malignant neoplasm of colon, unspecified: Secondary | ICD-10-CM | POA: Diagnosis not present

## 2024-05-31 ENCOUNTER — Other Ambulatory Visit: Payer: Self-pay

## 2024-06-02 ENCOUNTER — Inpatient Hospital Stay: Admitting: Hematology

## 2024-06-02 ENCOUNTER — Inpatient Hospital Stay

## 2024-06-02 VITALS — BP 166/86 | HR 56 | Temp 97.8°F | Resp 18

## 2024-06-02 DIAGNOSIS — C7972 Secondary malignant neoplasm of left adrenal gland: Secondary | ICD-10-CM | POA: Diagnosis not present

## 2024-06-02 DIAGNOSIS — Z5111 Encounter for antineoplastic chemotherapy: Secondary | ICD-10-CM | POA: Diagnosis not present

## 2024-06-02 DIAGNOSIS — Z79899 Other long term (current) drug therapy: Secondary | ICD-10-CM | POA: Diagnosis not present

## 2024-06-02 DIAGNOSIS — Z95828 Presence of other vascular implants and grafts: Secondary | ICD-10-CM

## 2024-06-02 DIAGNOSIS — K1379 Other lesions of oral mucosa: Secondary | ICD-10-CM

## 2024-06-02 DIAGNOSIS — C180 Malignant neoplasm of cecum: Secondary | ICD-10-CM | POA: Diagnosis not present

## 2024-06-02 DIAGNOSIS — Z87891 Personal history of nicotine dependence: Secondary | ICD-10-CM | POA: Diagnosis not present

## 2024-06-02 DIAGNOSIS — Z5112 Encounter for antineoplastic immunotherapy: Secondary | ICD-10-CM | POA: Diagnosis not present

## 2024-06-02 DIAGNOSIS — Z5189 Encounter for other specified aftercare: Secondary | ICD-10-CM | POA: Diagnosis not present

## 2024-06-02 DIAGNOSIS — D649 Anemia, unspecified: Secondary | ICD-10-CM | POA: Diagnosis not present

## 2024-06-02 DIAGNOSIS — C7802 Secondary malignant neoplasm of left lung: Secondary | ICD-10-CM | POA: Diagnosis not present

## 2024-06-02 DIAGNOSIS — E876 Hypokalemia: Secondary | ICD-10-CM | POA: Diagnosis not present

## 2024-06-02 DIAGNOSIS — C7801 Secondary malignant neoplasm of right lung: Secondary | ICD-10-CM | POA: Diagnosis not present

## 2024-06-02 LAB — CBC WITH DIFFERENTIAL/PLATELET
Abs Immature Granulocytes: 0.8 10*3/uL — ABNORMAL HIGH (ref 0.00–0.07)
Band Neutrophils: 1 %
Basophils Absolute: 0 10*3/uL (ref 0.0–0.1)
Basophils Relative: 0 %
Eosinophils Absolute: 0 10*3/uL (ref 0.0–0.5)
Eosinophils Relative: 0 %
HCT: 33.4 % — ABNORMAL LOW (ref 39.0–52.0)
Hemoglobin: 10.2 g/dL — ABNORMAL LOW (ref 13.0–17.0)
Lymphocytes Relative: 16 %
Lymphs Abs: 1.9 10*3/uL (ref 0.7–4.0)
MCH: 29.1 pg (ref 26.0–34.0)
MCHC: 30.5 g/dL (ref 30.0–36.0)
MCV: 95.4 fL (ref 80.0–100.0)
Metamyelocytes Relative: 5 %
Monocytes Absolute: 0.7 10*3/uL (ref 0.1–1.0)
Monocytes Relative: 6 %
Myelocytes: 2 %
Neutro Abs: 8.6 10*3/uL — ABNORMAL HIGH (ref 1.7–7.7)
Neutrophils Relative %: 70 %
Platelets: 142 10*3/uL — ABNORMAL LOW (ref 150–400)
RBC: 3.5 MIL/uL — ABNORMAL LOW (ref 4.22–5.81)
RDW: 18.6 % — ABNORMAL HIGH (ref 11.5–15.5)
WBC: 12.1 10*3/uL — ABNORMAL HIGH (ref 4.0–10.5)
nRBC: 0.2 % (ref 0.0–0.2)

## 2024-06-02 LAB — COMPREHENSIVE METABOLIC PANEL WITH GFR
ALT: 9 U/L (ref 0–44)
AST: 19 U/L (ref 15–41)
Albumin: 2.9 g/dL — ABNORMAL LOW (ref 3.5–5.0)
Alkaline Phosphatase: 84 U/L (ref 38–126)
Anion gap: 11 (ref 5–15)
BUN: 9 mg/dL (ref 8–23)
CO2: 22 mmol/L (ref 22–32)
Calcium: 8.3 mg/dL — ABNORMAL LOW (ref 8.9–10.3)
Chloride: 107 mmol/L (ref 98–111)
Creatinine, Ser: 1.17 mg/dL (ref 0.61–1.24)
GFR, Estimated: >60 mL/min (ref >60)
Glucose, Bld: 83 mg/dL (ref 70–99)
Potassium: 3.8 mmol/L (ref 3.5–5.1)
Sodium: 140 mmol/L (ref 135–145)
Total Bilirubin: 0.5 mg/dL (ref 0.0–1.2)
Total Protein: 6.2 g/dL — ABNORMAL LOW (ref 6.5–8.1)

## 2024-06-02 LAB — URINALYSIS, DIPSTICK ONLY
Bilirubin Urine: NEGATIVE
Glucose, UA: NEGATIVE mg/dL
Hgb urine dipstick: NEGATIVE
Ketones, ur: NEGATIVE mg/dL
Leukocytes,Ua: NEGATIVE
Nitrite: NEGATIVE
Protein, ur: NEGATIVE mg/dL
Specific Gravity, Urine: 1.015 (ref 1.005–1.030)
pH: 5 (ref 5.0–8.0)

## 2024-06-02 LAB — MAGNESIUM: Magnesium: 1.9 mg/dL (ref 1.7–2.4)

## 2024-06-02 MED ORDER — DEXAMETHASONE SODIUM PHOSPHATE 10 MG/ML IJ SOLN
10.0000 mg | Freq: Once | INTRAMUSCULAR | Status: AC
  Administered 2024-06-02: 10 mg via INTRAVENOUS
  Filled 2024-06-02: qty 1

## 2024-06-02 MED ORDER — SODIUM CHLORIDE 0.9 % IV SOLN
144.0000 mg/m2 | Freq: Once | INTRAVENOUS | Status: AC
  Administered 2024-06-02: 300 mg via INTRAVENOUS
  Filled 2024-06-02: qty 15

## 2024-06-02 MED ORDER — ATROPINE SULFATE 1 MG/ML IV SOLN
1.0000 mg | Freq: Once | INTRAVENOUS | Status: AC
  Administered 2024-06-02: 1 mg via INTRAVENOUS
  Filled 2024-06-02: qty 1

## 2024-06-02 MED ORDER — FLUOROURACIL CHEMO INJECTION 2.5 GM/50ML
320.0000 mg/m2 | Freq: Once | INTRAVENOUS | Status: AC
  Administered 2024-06-02: 600 mg via INTRAVENOUS
  Filled 2024-06-02: qty 12

## 2024-06-02 MED ORDER — SODIUM CHLORIDE 0.9% FLUSH
10.0000 mL | Freq: Once | INTRAVENOUS | Status: AC
  Administered 2024-06-02: 10 mL via INTRAVENOUS

## 2024-06-02 MED ORDER — SODIUM CHLORIDE 0.9 % IV SOLN: Freq: Once | INTRAVENOUS | Status: AC

## 2024-06-02 MED ORDER — SODIUM CHLORIDE 0.9 % IV SOLN
1920.0000 mg/m2 | INTRAVENOUS | Status: DC
  Administered 2024-06-02: 3500 mg via INTRAVENOUS
  Filled 2024-06-02: qty 70

## 2024-06-02 MED ORDER — SODIUM CHLORIDE 0.9 % IV SOLN
150.0000 mg | Freq: Once | INTRAVENOUS | Status: AC
  Administered 2024-06-02: 150 mg via INTRAVENOUS
  Filled 2024-06-02: qty 5

## 2024-06-02 MED ORDER — SODIUM CHLORIDE 0.9 % IV SOLN
5.0000 mg/kg | Freq: Once | INTRAVENOUS | Status: AC
  Administered 2024-06-02: 400 mg via INTRAVENOUS
  Filled 2024-06-02: qty 16

## 2024-06-02 MED ORDER — SODIUM CHLORIDE 0.9 % IV SOLN
400.0000 mg/m2 | Freq: Once | INTRAVENOUS | Status: AC
  Administered 2024-06-02: 780 mg via INTRAVENOUS
  Filled 2024-06-02: qty 39

## 2024-06-02 MED ORDER — MISC. DEVICES MISC
3 refills | Status: AC
Start: 2024-06-02 — End: ?

## 2024-06-02 MED ORDER — PALONOSETRON HCL INJECTION 0.25 MG/5ML
0.2500 mg | Freq: Once | INTRAVENOUS | Status: AC
  Administered 2024-06-02: 0.25 mg via INTRAVENOUS
  Filled 2024-06-02: qty 5

## 2024-06-02 NOTE — Progress Notes (Signed)
 Maintain dose of fluorouracil  CIV at 3500 mg despite slight weight increase.  V.O. Dr Theadore Molt, PharmD

## 2024-06-02 NOTE — Progress Notes (Signed)
 Patient presents today for MVASI , and Folfiri infusion. Vital signs and lab work are within parameters for treatment. Patient has complaints of mouth soreness and requests medication refill for magic mouth wash and lidocaine  swish.   Treatment given today per MD orders. Tolerated infusion without adverse affects. Vital signs stable. 5FU pump infusing and RUN noted on screen. Verified with patient.  No complaints at this time. Discharged from clinic ambulatory in stable condition. Alert and oriented x 3. F/U with [facility] as scheduled.

## 2024-06-02 NOTE — Patient Instructions (Signed)
 [facility] [place] - A DEPT OF [facility]  Discharge Instructions: Thank you for choosing [facility] to provide your oncology and hematology care.  If you have a lab appointment with the Cancer Center - please note that after April 8th, 2024, all labs will be drawn in the cancer center.  You do not have to check in or register with the main entrance as you have in the past but will complete your check-in in the cancer center.  Wear comfortable clothing and clothing appropriate for easy access to any Portacath or PICC line.   We strive to give you quality time with your provider. You may need to reschedule your appointment if you arrive late (15 or more minutes).  Arriving late affects you and other patients whose appointments are after yours.  Also, if you miss three or more appointments without notifying the office, you may be dismissed from the clinic at the provider's discretion.      For prescription refill requests, have your pharmacy contact our office and allow 72 hours for refills to be completed.    Today you received the following chemotherapy and/or immunotherapy agents Adrucil , irinotecan , leucovorin . Leucovorin  Injection What is this medication? LEUCOVORIN  (loo koe VOR in) prevents side effects from certain medications, such as methotrexate. It works by increasing folate levels. This helps protect healthy cells in your body. It may also be used to treat anemia caused by low levels of folate. It can also be used with fluorouracil , a type of chemotherapy, to treat colorectal cancer. It works by increasing the effects of fluorouracil  in the body. This medicine may be used for other purposes; ask your health care provider or pharmacist if you have questions. What should I tell my care team before I take this medication? They need to know if you have any of these conditions: Anemia from low levels of vitamin B12 in the blood An unusual or allergic  reaction to leucovorin , folic acid , other medications, foods, dyes, or preservatives Pregnant or trying to get pregnant Breastfeeding How should I use this medication? This medication is injected into a vein or a muscle. It is given by your care team in a hospital or clinic setting. Talk to your care team about the use of this medication in children. Special care may be needed. Overdosage: If you think you have taken too much of this medicine contact a poison control center or emergency room at once. NOTE: This medicine is only for you. Do not share this medicine with others. What if I miss a dose? Keep appointments for follow-up doses. It is important not to miss your dose. Call your care team if you are unable to keep an appointment. What may interact with this medication? Capecitabine Fluorouracil  Phenobarbital Phenytoin Primidone Trimethoprim;sulfamethoxazole This list may not describe all possible interactions. Give your health care provider a list of all the medicines, herbs, non-prescription drugs, or dietary supplements you use. Also tell them if you smoke, drink alcohol , or use illegal drugs. Some items may interact with your medicine. What should I watch for while using this medication? Your condition will be monitored carefully while you are receiving this medication. This medication may increase the side effects of 5-fluorouracil . Tell your care team if you have diarrhea or mouth sores that do not get better or that get worse. What side effects may I notice from receiving this medication? Side effects that you should report to your care team as  soon as possible: Allergic reactions--skin rash, itching, hives, swelling of the face, lips, tongue, or throat This list may not describe all possible side effects. Call your doctor for medical advice about side effects. You may report side effects to FDA at 1-800-FDA-1088. Where should I keep my medication? This medication is given in a  hospital or clinic. It will not be stored at home. NOTE: This sheet is a summary. It may not cover all possible information. If you have questions about this medicine, talk to your doctor, pharmacist, or health care provider.  2024 Elsevier/Gold Standard (2022-05-02 00:00:00)Irinotecan  Injection What is this medication? IRINOTECAN  (ir in oh TEE kan) treats some types of cancer. It works by slowing down the growth of cancer cells. This medicine may be used for other purposes; ask your health care provider or pharmacist if you have questions. COMMON BRAND NAME(S): Camptosar  What should I tell my care team before I take this medication? They need to know if you have any of these conditions: Dehydration Diarrhea Infection, especially a viral infection, such as chickenpox, cold sores, herpes Liver disease Low blood cell levels (white cells, red cells, and platelets) Low levels of electrolytes, such as calcium , magnesium , or potassium in your blood Recent or ongoing radiation An unusual or allergic reaction to irinotecan , other medications, foods, dyes, or preservatives If you or your partner are pregnant or trying to get pregnant Breast-feeding How should I use this medication? This medication is injected into a vein. It is given by your care team in a hospital or clinic setting. Talk to your care team about the use of this medication in children. Special care may be needed. Overdosage: If you think you have taken too much of this medicine contact a poison control center or emergency room at once. NOTE: This medicine is only for you. Do not share this medicine with others. What if I miss a dose? Keep appointments for follow-up doses. It is important not to miss your dose. Call your care team if you are unable to keep an appointment. What may interact with this medication? Do not take this medication with any of the following: Cobicistat Itraconazole This medication may also interact with  the following: Certain antibiotics, such as clarithromycin, rifampin, rifabutin Certain antivirals for HIV or AIDS Certain medications for fungal infections, such as ketoconazole, posaconazole, voriconazole Certain medications for seizures, such as carbamazepine, phenobarbital, phenytoin Gemfibrozil Nefazodone St. John's wort This list may not describe all possible interactions. Give your health care provider a list of all the medicines, herbs, non-prescription drugs, or dietary supplements you use. Also tell them if you smoke, drink alcohol , or use illegal drugs. Some items may interact with your medicine. What should I watch for while using this medication? Your condition will be monitored carefully while you are receiving this medication. You may need blood work while taking this medication. This medication may make you feel generally unwell. This is not uncommon as chemotherapy can affect healthy cells as well as cancer cells. Report any side effects. Continue your course of treatment even though you feel ill unless your care team tells you to stop. This medication can cause serious side effects. To reduce the risk, your care team may give you other medications to take before receiving this one. Be sure to follow the directions from your care team. This medication may affect your coordination, reaction time, or judgement. Do not drive or operate machinery until you know how this medication affects you. Sit up or stand  slowly to reduce the risk of dizzy or fainting spells. Drinking alcohol  with this medication can increase the risk of these side effects. This medication may increase your risk of getting an infection. Call your care team for advice if you get a fever, chills, sore throat, or other symptoms of a cold or flu. Do not treat yourself. Try to avoid being around people who are sick. Avoid taking medications that contain aspirin , acetaminophen , ibuprofen , naproxen, or ketoprofen unless  instructed by your care team. These medications may hide a fever. This medication may increase your risk to bruise or bleed. Call your care team if you notice any unusual bleeding. Be careful brushing or flossing your teeth or using a toothpick because you may get an infection or bleed more easily. If you have any dental work done, tell your dentist you are receiving this medication. Talk to your care team if you or your partner are pregnant or think either of you might be pregnant. This medication can cause serious birth defects if taken during pregnancy and for 6 months after the last dose. You will need a negative pregnancy test before starting this medication. Contraception is recommended while taking this medication and for 6 months after the last dose. Your care team can help you find the option that works for you. Do not father a child while taking this medication and for 3 months after the last dose. Use a condom for contraception during this time period. Do not breastfeed while taking this medication and for 7 days after the last dose. This medication may cause infertility. Talk to your care team if you are concerned about your fertility. What side effects may I notice from receiving this medication? Side effects that you should report to your care team as soon as possible: Allergic reactions--skin rash, itching, hives, swelling of the face, lips, tongue, or throat Dry cough, shortness of breath or trouble breathing Increased saliva or tears, increased sweating, stomach cramping, diarrhea, small pupils, unusual weakness or fatigue, slow heartbeat Infection--fever, chills, cough, sore throat, wounds that don't heal, pain or trouble when passing urine, general feeling of discomfort or being unwell Kidney injury--decrease in the amount of urine, swelling of the ankles, hands, or feet Low red blood cell level--unusual weakness or fatigue, dizziness, headache, trouble breathing Severe or prolonged  diarrhea Unusual bruising or bleeding Side effects that usually do not require medical attention (report to your care team if they continue or are bothersome): Constipation Diarrhea Hair loss Loss of appetite Nausea Stomach pain This list may not describe all possible side effects. Call your doctor for medical advice about side effects. You may report side effects to FDA at 1-800-FDA-1088. Where should I keep my medication? This medication is given in a hospital or clinic. It will not be stored at home. NOTE: This sheet is a summary. It may not cover all possible information. If you have questions about this medicine, talk to your doctor, pharmacist, or health care provider.  2024 Elsevier/Gold Standard (2022-04-10 00:00:00)Fluorouracil  Injection What is this medication? FLUOROURACIL  (flure oh YOOR a sil) treats some types of cancer. It works by slowing down the growth of cancer cells. This medicine may be used for other purposes; ask your health care provider or pharmacist if you have questions. COMMON BRAND NAME(S): Adrucil  What should I tell my care team before I take this medication? They need to know if you have any of these conditions: Blood disorders Dihydropyrimidine dehydrogenase (DPD) deficiency Infection, such as chickenpox, cold  sores, herpes Kidney disease Liver disease Poor nutrition Recent or ongoing radiation therapy An unusual or allergic reaction to fluorouracil , other medications, foods, dyes, or preservatives If you or your partner are pregnant or trying to get pregnant Breast-feeding How should I use this medication? This medication is injected into a vein. It is administered by your care team in a hospital or clinic setting. Talk to your care team about the use of this medication in children. Special care may be needed. Overdosage: If you think you have taken too much of this medicine contact a poison control center or emergency room at once. NOTE: This  medicine is only for you. Do not share this medicine with others. What if I miss a dose? Keep appointments for follow-up doses. It is important not to miss your dose. Call your care team if you are unable to keep an appointment. What may interact with this medication? Do not take this medication with any of the following: Live virus vaccines This medication may also interact with the following: Medications that treat or prevent blood clots, such as warfarin, enoxaparin, dalteparin This list may not describe all possible interactions. Give your health care provider a list of all the medicines, herbs, non-prescription drugs, or dietary supplements you use. Also tell them if you smoke, drink alcohol , or use illegal drugs. Some items may interact with your medicine. What should I watch for while using this medication? Your condition will be monitored carefully while you are receiving this medication. This medication may make you feel generally unwell. This is not uncommon as chemotherapy can affect healthy cells as well as cancer cells. Report any side effects. Continue your course of treatment even though you feel ill unless your care team tells you to stop. In some cases, you may be given additional medications to help with side effects. Follow all directions for their use. This medication may increase your risk of getting an infection. Call your care team for advice if you get a fever, chills, sore throat, or other symptoms of a cold or flu. Do not treat yourself. Try to avoid being around people who are sick. This medication may increase your risk to bruise or bleed. Call your care team if you notice any unusual bleeding. Be careful brushing or flossing your teeth or using a toothpick because you may get an infection or bleed more easily. If you have any dental work done, tell your dentist you are receiving this medication. Avoid taking medications that contain aspirin , acetaminophen , ibuprofen ,  naproxen, or ketoprofen unless instructed by your care team. These medications may hide a fever. Do not treat diarrhea with over the counter products. Contact your care team if you have diarrhea that lasts more than 2 days or if it is severe and watery. This medication can make you more sensitive to the sun. Keep out of the sun. If you cannot avoid being in the sun, wear protective clothing and sunscreen. Do not use sun lamps, tanning beds, or tanning booths. Talk to your care team if you or your partner wish to become pregnant or think you might be pregnant. This medication can cause serious birth defects if taken during pregnancy and for 3 months after the last dose. A reliable form of contraception is recommended while taking this medication and for 3 months after the last dose. Talk to your care team about effective forms of contraception. Do not father a child while taking this medication and for 3 months after the last dose.  Use a condom while having sex during this time period. Do not breastfeed while taking this medication. This medication may cause infertility. Talk to your care team if you are concerned about your fertility. What side effects may I notice from receiving this medication? Side effects that you should report to your care team as soon as possible: Allergic reactions--skin rash, itching, hives, swelling of the face, lips, tongue, or throat Heart attack--pain or tightness in the chest, shoulders, arms, or jaw, nausea, shortness of breath, cold or clammy skin, feeling faint or lightheaded Heart failure--shortness of breath, swelling of the ankles, feet, or hands, sudden weight gain, unusual weakness or fatigue Heart rhythm changes--fast or irregular heartbeat, dizziness, feeling faint or lightheaded, chest pain, trouble breathing High ammonia level--unusual weakness or fatigue, confusion, loss of appetite, nausea, vomiting, seizures Infection--fever, chills, cough, sore throat,  wounds that don't heal, pain or trouble when passing urine, general feeling of discomfort or being unwell Low red blood cell level--unusual weakness or fatigue, dizziness, headache, trouble breathing Pain, tingling, or numbness in the hands or feet, muscle weakness, change in vision, confusion or trouble speaking, loss of balance or coordination, trouble walking, seizures Redness, swelling, and blistering of the skin over hands and feet Severe or prolonged diarrhea Unusual bruising or bleeding Side effects that usually do not require medical attention (report to your care team if they continue or are bothersome): Dry skin Headache Increased tears Nausea Pain, redness, or swelling with sores inside the mouth or throat Sensitivity to light Vomiting This list may not describe all possible side effects. Call your doctor for medical advice about side effects. You may report side effects to FDA at 1-800-FDA-1088. Where should I keep my medication? This medication is given in a hospital or clinic. It will not be stored at home. NOTE: This sheet is a summary. It may not cover all possible information. If you have questions about this medicine, talk to your doctor, pharmacist, or health care provider.  2024 Elsevier/Gold Standard (2022-04-04 00:00:00)      To help prevent nausea and vomiting after your treatment, we encourage you to take your nausea medication as directed.  BELOW ARE SYMPTOMS THAT SHOULD BE REPORTED IMMEDIATELY: *FEVER GREATER THAN 100.4 F (38 C) OR HIGHER *CHILLS OR SWEATING *NAUSEA AND VOMITING THAT IS NOT CONTROLLED WITH YOUR NAUSEA MEDICATION *UNUSUAL SHORTNESS OF BREATH *UNUSUAL BRUISING OR BLEEDING *URINARY PROBLEMS (pain or burning when urinating, or frequent urination) *BOWEL PROBLEMS (unusual diarrhea, constipation, pain near the anus) TENDERNESS IN MOUTH AND THROAT WITH OR WITHOUT PRESENCE OF ULCERS (sore throat, sores in mouth, or a toothache) UNUSUAL RASH,  SWELLING OR PAIN  UNUSUAL VAGINAL DISCHARGE OR ITCHING   Items with * indicate a potential emergency and should be followed up as soon as possible or go to the Emergency Department if any problems should occur.  Please show the CHEMOTHERAPY ALERT CARD or IMMUNOTHERAPY ALERT CARD at check-in to the Emergency Department and triage nurse.  Should you have questions after your visit or need to cancel or reschedule your appointment, please contact [facility] [place] - A DEPT OF [name] [phone]  and follow the prompts.  Office hours are 8:00 a.m. to 4:30 p.m. Monday - Friday. Please note that voicemails left after 4:00 p.m. may not be returned until the following business day.  We are closed weekends and major holidays. You have access to a nurse at all times for urgent questions. Please call the main number  to the clinic [phone] and follow the prompts.  For any non-urgent questions, you may also contact your provider using MyChart. We now offer e-Visits for anyone 44 and older to request care online for non-urgent symptoms. For details visit [email or web address].   Also download the MyChart app! Go to the app store, search MyChart, open the app, select [facility], and log in with your MyChart username and password. The chemotherapy medication bag should finish at 46 hours, 96 hours, or 7 days. For example, if your pump is scheduled for 46 hours and it was put on at 4:00 p.m., it should finish at 2:00 p.m. the day it is scheduled to come off regardless of your appointment time.     Estimated time to finish at 12:30 pm.   If the display on your pump reads Low Volume and it is beeping, take the batteries out of the pump and come to the cancer center for it to be taken off.   If the pump alarms go off prior to the pump reading Low Volume then call [phone] and someone can assist you.  If the plunger comes out and the chemotherapy medication is leaking  out, please use your home chemo spill kit to clean up the spill. Do NOT use paper towels or other household products.  If you have problems or questions regarding your pump, please call either [phone] (24 hours a day) or the cancer center Monday-Friday 8:00 a.m.- 4:30 p.m. at the clinic number and we will assist you. If you are unable to get assistance, then go to the nearest Emergency Department and ask the staff to contact the IV team for assistance.

## 2024-06-03 LAB — CEA: CEA: 171 ng/mL — ABNORMAL HIGH (ref 0.0–4.7)

## 2024-06-04 ENCOUNTER — Inpatient Hospital Stay

## 2024-06-04 VITALS — BP 150/90 | HR 64 | Temp 98.1°F | Resp 18

## 2024-06-04 DIAGNOSIS — Z5111 Encounter for antineoplastic chemotherapy: Secondary | ICD-10-CM | POA: Diagnosis not present

## 2024-06-04 DIAGNOSIS — C7802 Secondary malignant neoplasm of left lung: Secondary | ICD-10-CM | POA: Diagnosis not present

## 2024-06-04 DIAGNOSIS — D649 Anemia, unspecified: Secondary | ICD-10-CM | POA: Diagnosis not present

## 2024-06-04 DIAGNOSIS — C7972 Secondary malignant neoplasm of left adrenal gland: Secondary | ICD-10-CM | POA: Diagnosis not present

## 2024-06-04 DIAGNOSIS — C180 Malignant neoplasm of cecum: Secondary | ICD-10-CM | POA: Diagnosis not present

## 2024-06-04 DIAGNOSIS — C7801 Secondary malignant neoplasm of right lung: Secondary | ICD-10-CM | POA: Diagnosis not present

## 2024-06-04 DIAGNOSIS — Z87891 Personal history of nicotine dependence: Secondary | ICD-10-CM | POA: Diagnosis not present

## 2024-06-04 DIAGNOSIS — E876 Hypokalemia: Secondary | ICD-10-CM | POA: Diagnosis not present

## 2024-06-04 DIAGNOSIS — Z5189 Encounter for other specified aftercare: Secondary | ICD-10-CM | POA: Diagnosis not present

## 2024-06-04 DIAGNOSIS — Z5112 Encounter for antineoplastic immunotherapy: Secondary | ICD-10-CM | POA: Diagnosis not present

## 2024-06-04 DIAGNOSIS — Z95828 Presence of other vascular implants and grafts: Secondary | ICD-10-CM

## 2024-06-04 DIAGNOSIS — Z79899 Other long term (current) drug therapy: Secondary | ICD-10-CM | POA: Diagnosis not present

## 2024-06-04 MED ORDER — HEPARIN SOD (PORK) LOCK FLUSH 100 UNIT/ML IV SOLN
500.0000 [IU] | Freq: Once | INTRAVENOUS | Status: AC | PRN
  Administered 2024-06-04: 500 [IU]

## 2024-06-04 MED ORDER — PEGFILGRASTIM-CBQV 6 MG/0.6ML ~~LOC~~ SOSY
6.0000 mg | PREFILLED_SYRINGE | Freq: Once | SUBCUTANEOUS | Status: AC
  Administered 2024-06-04: 6 mg via SUBCUTANEOUS
  Filled 2024-06-04: qty 0.6

## 2024-06-04 MED ORDER — SODIUM CHLORIDE 0.9% FLUSH
10.0000 mL | INTRAVENOUS | Status: DC | PRN
  Administered 2024-06-04: 10 mL

## 2024-06-04 NOTE — Patient Instructions (Signed)
 [facility] [place] - A DEPT OF [facility]  Discharge Instructions: Thank you for choosing [facility] to provide your oncology and hematology care.  If you have a lab appointment with the Cancer Center - please note that after April 8th, 2024, all labs will be drawn in the cancer center.  You do not have to check in or register with the main entrance as you have in the past but will complete your check-in in the cancer center.  Wear comfortable clothing and clothing appropriate for easy access to any Portacath or PICC line.   We strive to give you quality time with your provider. You may need to reschedule your appointment if you arrive late (15 or more minutes).  Arriving late affects you and other patients whose appointments are after yours.  Also, if you miss three or more appointments without notifying the office, you may be dismissed from the clinic at the provider's discretion.      For prescription refill requests, have your pharmacy contact our office and allow 72 hours for refills to be completed.    Today you received the following chemotherapy and/or immunotherapy agents pump dc      To help prevent nausea and vomiting after your treatment, we encourage you to take your nausea medication as directed.  BELOW ARE SYMPTOMS THAT SHOULD BE REPORTED IMMEDIATELY: *FEVER GREATER THAN 100.4 F (38 C) OR HIGHER *CHILLS OR SWEATING *NAUSEA AND VOMITING THAT IS NOT CONTROLLED WITH YOUR NAUSEA MEDICATION *UNUSUAL SHORTNESS OF BREATH *UNUSUAL BRUISING OR BLEEDING *URINARY PROBLEMS (pain or burning when urinating, or frequent urination) *BOWEL PROBLEMS (unusual diarrhea, constipation, pain near the anus) TENDERNESS IN MOUTH AND THROAT WITH OR WITHOUT PRESENCE OF ULCERS (sore throat, sores in mouth, or a toothache) UNUSUAL RASH, SWELLING OR PAIN  UNUSUAL VAGINAL DISCHARGE OR ITCHING   Items with * indicate a potential emergency and should be followed up  as soon as possible or go to the Emergency Department if any problems should occur.  Please show the CHEMOTHERAPY ALERT CARD or IMMUNOTHERAPY ALERT CARD at check-in to the Emergency Department and triage nurse.  Should you have questions after your visit or need to cancel or reschedule your appointment, please contact [facility] CANCER CTR [place] - A DEPT OF [facility] [phone]  and follow the prompts.  Office hours are 8:00 a.m. to 4:30 p.m. Monday - Friday. Please note that voicemails left after 4:00 p.m. may not be returned until the following business day.  We are closed weekends and major holidays. You have access to a nurse at all times for urgent questions. Please call the main number to the clinic [phone] and follow the prompts.  For any non-urgent questions, you may also contact your provider using MyChart. We now offer e-Visits for anyone 56 and older to request care online for non-urgent symptoms. For details visit [email or web address].   Also download the MyChart app! Go to the app store, search "MyChart", open the app, select [place], and log in with your MyChart username and password.

## 2024-06-05 ENCOUNTER — Ambulatory Visit: Admitting: Internal Medicine

## 2024-06-10 ENCOUNTER — Encounter: Payer: Self-pay | Admitting: Internal Medicine

## 2024-06-16 ENCOUNTER — Inpatient Hospital Stay

## 2024-06-16 ENCOUNTER — Inpatient Hospital Stay: Attending: Hematology

## 2024-06-16 ENCOUNTER — Encounter (HOSPITAL_COMMUNITY): Payer: Self-pay | Admitting: Hematology

## 2024-06-16 ENCOUNTER — Inpatient Hospital Stay: Admitting: Hematology

## 2024-06-16 ENCOUNTER — Encounter: Payer: Self-pay | Admitting: Hematology

## 2024-06-16 VITALS — BP 163/93 | HR 64 | Temp 97.2°F | Resp 18 | Wt 182.0 lb

## 2024-06-16 DIAGNOSIS — Z5189 Encounter for other specified aftercare: Secondary | ICD-10-CM | POA: Insufficient documentation

## 2024-06-16 DIAGNOSIS — C7972 Secondary malignant neoplasm of left adrenal gland: Secondary | ICD-10-CM | POA: Insufficient documentation

## 2024-06-16 DIAGNOSIS — C7801 Secondary malignant neoplasm of right lung: Secondary | ICD-10-CM | POA: Insufficient documentation

## 2024-06-16 DIAGNOSIS — Z5112 Encounter for antineoplastic immunotherapy: Secondary | ICD-10-CM | POA: Insufficient documentation

## 2024-06-16 DIAGNOSIS — C7802 Secondary malignant neoplasm of left lung: Secondary | ICD-10-CM | POA: Diagnosis not present

## 2024-06-16 DIAGNOSIS — Z5111 Encounter for antineoplastic chemotherapy: Secondary | ICD-10-CM | POA: Diagnosis not present

## 2024-06-16 DIAGNOSIS — C180 Malignant neoplasm of cecum: Secondary | ICD-10-CM | POA: Diagnosis not present

## 2024-06-16 DIAGNOSIS — Z95828 Presence of other vascular implants and grafts: Secondary | ICD-10-CM

## 2024-06-16 DIAGNOSIS — C189 Malignant neoplasm of colon, unspecified: Secondary | ICD-10-CM | POA: Diagnosis not present

## 2024-06-16 LAB — COMPREHENSIVE METABOLIC PANEL WITH GFR
ALT: 11 U/L (ref 0–44)
AST: 22 U/L (ref 15–41)
Albumin: 3.2 g/dL — ABNORMAL LOW (ref 3.5–5.0)
Alkaline Phosphatase: 92 U/L (ref 38–126)
Anion gap: 5 (ref 5–15)
BUN: 8 mg/dL (ref 8–23)
CO2: 25 mmol/L (ref 22–32)
Calcium: 8.7 mg/dL — ABNORMAL LOW (ref 8.9–10.3)
Chloride: 107 mmol/L (ref 98–111)
Creatinine, Ser: 0.9 mg/dL (ref 0.61–1.24)
GFR, Estimated: >60 mL/min (ref >60)
Glucose, Bld: 84 mg/dL (ref 70–99)
Potassium: 3.9 mmol/L (ref 3.5–5.1)
Sodium: 137 mmol/L (ref 135–145)
Total Bilirubin: 0.6 mg/dL (ref 0.0–1.2)
Total Protein: 6.8 g/dL (ref 6.5–8.1)

## 2024-06-16 LAB — CBC WITH DIFFERENTIAL/PLATELET
Abs Immature Granulocytes: 0.45 K/uL — ABNORMAL HIGH (ref 0.00–0.07)
Basophils Absolute: 0.1 K/uL (ref 0.0–0.1)
Basophils Relative: 1 %
Eosinophils Absolute: 0.2 K/uL (ref 0.0–0.5)
Eosinophils Relative: 2 %
HCT: 34.1 % — ABNORMAL LOW (ref 39.0–52.0)
Hemoglobin: 10.9 g/dL — ABNORMAL LOW (ref 13.0–17.0)
Immature Granulocytes: 5 %
Lymphocytes Relative: 20 %
Lymphs Abs: 1.9 K/uL (ref 0.7–4.0)
MCH: 30.1 pg (ref 26.0–34.0)
MCHC: 32 g/dL (ref 30.0–36.0)
MCV: 94.2 fL (ref 80.0–100.0)
Monocytes Absolute: 1.2 K/uL — ABNORMAL HIGH (ref 0.1–1.0)
Monocytes Relative: 13 %
Neutro Abs: 5.7 K/uL (ref 1.7–7.7)
Neutrophils Relative %: 59 %
Platelets: 157 K/uL (ref 150–400)
RBC: 3.62 MIL/uL — ABNORMAL LOW (ref 4.22–5.81)
RDW: 18.3 % — ABNORMAL HIGH (ref 11.5–15.5)
WBC: 9.5 K/uL (ref 4.0–10.5)
nRBC: 0 % (ref 0.0–0.2)

## 2024-06-16 LAB — URINALYSIS, DIPSTICK ONLY
Bilirubin Urine: NEGATIVE
Glucose, UA: NEGATIVE mg/dL
Ketones, ur: NEGATIVE mg/dL
Leukocytes,Ua: NEGATIVE
Nitrite: NEGATIVE
Specific Gravity, Urine: 1.02 (ref 1.005–1.030)
pH: 6 (ref 5.0–8.0)

## 2024-06-16 LAB — MAGNESIUM: Magnesium: 1.8 mg/dL (ref 1.7–2.4)

## 2024-06-16 MED ORDER — SODIUM CHLORIDE 0.9 % IV SOLN
400.0000 mg/m2 | Freq: Once | INTRAVENOUS | Status: AC
  Administered 2024-06-16: 780 mg via INTRAVENOUS
  Filled 2024-06-16: qty 39

## 2024-06-16 MED ORDER — DEXAMETHASONE SODIUM PHOSPHATE 10 MG/ML IJ SOLN
10.0000 mg | Freq: Once | INTRAMUSCULAR | Status: AC
  Administered 2024-06-16: 10 mg via INTRAVENOUS
  Filled 2024-06-16: qty 1

## 2024-06-16 MED ORDER — ATROPINE SULFATE 1 MG/ML IV SOLN
1.0000 mg | Freq: Once | INTRAVENOUS | Status: AC
  Administered 2024-06-16: 1 mg via INTRAVENOUS
  Filled 2024-06-16: qty 1

## 2024-06-16 MED ORDER — PALONOSETRON HCL INJECTION 0.25 MG/5ML
0.2500 mg | Freq: Once | INTRAVENOUS | Status: AC
  Administered 2024-06-16: 0.25 mg via INTRAVENOUS
  Filled 2024-06-16: qty 5

## 2024-06-16 MED ORDER — FLUOROURACIL CHEMO INJECTION 2.5 GM/50ML
320.0000 mg/m2 | Freq: Once | INTRAVENOUS | Status: AC
  Administered 2024-06-16: 600 mg via INTRAVENOUS
  Filled 2024-06-16: qty 12

## 2024-06-16 MED ORDER — SODIUM CHLORIDE 0.9 % IV SOLN
5.0000 mg/kg | Freq: Once | INTRAVENOUS | Status: AC
  Administered 2024-06-16: 400 mg via INTRAVENOUS
  Filled 2024-06-16: qty 16

## 2024-06-16 MED ORDER — SODIUM CHLORIDE 0.9 % IV SOLN
150.0000 mg | Freq: Once | INTRAVENOUS | Status: AC
  Administered 2024-06-16: 150 mg via INTRAVENOUS
  Filled 2024-06-16: qty 150

## 2024-06-16 MED ORDER — SODIUM CHLORIDE 0.9 % IV SOLN: Freq: Once | INTRAVENOUS | Status: AC

## 2024-06-16 MED ORDER — SODIUM CHLORIDE 0.9 % IV SOLN
1920.0000 mg/m2 | INTRAVENOUS | Status: DC
  Administered 2024-06-16: 3500 mg via INTRAVENOUS
  Filled 2024-06-16: qty 70

## 2024-06-16 MED ORDER — SODIUM CHLORIDE 0.9 % IV SOLN
144.0000 mg/m2 | Freq: Once | INTRAVENOUS | Status: AC
  Administered 2024-06-16: 300 mg via INTRAVENOUS
  Filled 2024-06-16: qty 15

## 2024-06-16 MED ORDER — SODIUM CHLORIDE 0.9% FLUSH
10.0000 mL | INTRAVENOUS | Status: DC | PRN
  Administered 2024-06-16: 10 mL via INTRAVENOUS

## 2024-06-16 NOTE — Progress Notes (Addendum)
 Patient presents today for MVASI /Folfiri infusion. Patient is in satisfactory condition with no new complaints voiced.  Vital signs are stable.  Labs reviewed and all labs are within treatment parameters.  We will proceed with treatment per MD orders.    Treatment given today per MD orders. Tolerated infusion without adverse affects. Vital signs stable. No complaints at this time. Discharged from clinic ambulatory in stable condition. Alert and oriented x 3. F/U with [facility] as scheduled. 5FU ambulatory pump infusing with no alarms beeping.

## 2024-06-16 NOTE — Patient Instructions (Addendum)
 [facility] [place] - A DEPT OF [place]  Discharge Instructions: Thank you for choosing [facility] to provide your oncology and hematology care.  If you have a lab appointment with the Cancer Center - please note that after April 8th, 2024, all labs will be drawn in the cancer center.  You do not have to check in or register with the main entrance as you have in the past but will complete your check-in in the cancer center.  Wear comfortable clothing and clothing appropriate for easy access to any Portacath or PICC line.   We strive to give you quality time with your provider. You may need to reschedule your appointment if you arrive late (15 or more minutes).  Arriving late affects you and other patients whose appointments are after yours.  Also, if you miss three or more appointments without notifying the office, you may be dismissed from the clinic at the provider's discretion.      For prescription refill requests, have your pharmacy contact our office and allow 72 hours for refills to be completed.    Today you received the following chemotherapy and/or immunotherapy agents MVASI /Folfiri    To help prevent nausea and vomiting after your treatment, we encourage you to take your nausea medication as directed.  BELOW ARE SYMPTOMS THAT SHOULD BE REPORTED IMMEDIATELY: *FEVER GREATER THAN 100.4 F (38 C) OR HIGHER *CHILLS OR SWEATING *NAUSEA AND VOMITING THAT IS NOT CONTROLLED WITH YOUR NAUSEA MEDICATION *UNUSUAL SHORTNESS OF BREATH *UNUSUAL BRUISING OR BLEEDING *URINARY PROBLEMS (pain or burning when urinating, or frequent urination) *BOWEL PROBLEMS (unusual diarrhea, constipation, pain near the anus) TENDERNESS IN MOUTH AND THROAT WITH OR WITHOUT PRESENCE OF ULCERS (sore throat, sores in mouth, or a toothache) UNUSUAL RASH, SWELLING OR PAIN  UNUSUAL VAGINAL DISCHARGE OR ITCHING   Items with * indicate a potential emergency and should be followed  up as soon as possible or go to the Emergency Department if any problems should occur.  Please show the CHEMOTHERAPY ALERT CARD or IMMUNOTHERAPY ALERT CARD at check-in to the Emergency Department and triage nurse.  Should you have questions after your visit or need to cancel or reschedule your appointment, please contact [facility]CER CTR [place] - A DEPT OF [name] [phone]  and follow the prompts.  Office hours are 8:00 a.m. to 4:30 p.m. Monday - Friday. Please note that voicemails left after 4:00 p.m. may not be returned until the following business day.  We are closed weekends and major holidays. You have access to a nurse at all times for urgent questions. Please call the main number to the clinic [phone] and follow the prompts.  For any non-urgent questions, you may also contact your provider using MyChart. We now offer e-Visits for anyone 57 and older to request care online for non-urgent symptoms. For details visit [email or web address].   Also download the MyChart app! Go to the app store, search MyChart, open the app, select [place], and log in with your MyChart username and password.  [facility] [place] - A DEPT OF [place]  Discharge Instructions: Thank you for choosing [facility] to provide your oncology and hematology care.  If you have a lab appointment with the Cancer Center - please note that after April 8th, 2024, all labs will be drawn in the cancer center.  You do not have to check in or register  with the main entrance as you have in the past but will complete your check-in in the cancer center.  Wear comfortable clothing and clothing appropriate for easy access to any Portacath or PICC line.   We strive to give you quality time with your provider. You may need to reschedule your appointment if you arrive late (15 or more minutes).  Arriving late affects you and other patients whose appointments are  after yours.  Also, if you miss three or more appointments without notifying the office, you may be dismissed from the clinic at the provider's discretion.      For prescription refill requests, have your pharmacy contact our office and allow 72 hours for refills to be completed.

## 2024-06-16 NOTE — Progress Notes (Signed)
 Patients port flushed without difficulty.  Good blood return noted with no bruising or swelling noted at site.  Patient remains accessed for treatment.

## 2024-06-18 ENCOUNTER — Inpatient Hospital Stay

## 2024-06-18 VITALS — BP 159/98 | HR 72 | Temp 97.3°F | Resp 20

## 2024-06-18 DIAGNOSIS — Z5189 Encounter for other specified aftercare: Secondary | ICD-10-CM | POA: Diagnosis not present

## 2024-06-18 DIAGNOSIS — Z95828 Presence of other vascular implants and grafts: Secondary | ICD-10-CM

## 2024-06-18 DIAGNOSIS — C7802 Secondary malignant neoplasm of left lung: Secondary | ICD-10-CM | POA: Diagnosis not present

## 2024-06-18 DIAGNOSIS — C180 Malignant neoplasm of cecum: Secondary | ICD-10-CM | POA: Diagnosis not present

## 2024-06-18 DIAGNOSIS — C7801 Secondary malignant neoplasm of right lung: Secondary | ICD-10-CM | POA: Diagnosis not present

## 2024-06-18 DIAGNOSIS — Z5111 Encounter for antineoplastic chemotherapy: Secondary | ICD-10-CM | POA: Diagnosis not present

## 2024-06-18 DIAGNOSIS — C7972 Secondary malignant neoplasm of left adrenal gland: Secondary | ICD-10-CM | POA: Diagnosis not present

## 2024-06-18 DIAGNOSIS — Z5112 Encounter for antineoplastic immunotherapy: Secondary | ICD-10-CM | POA: Diagnosis not present

## 2024-06-18 LAB — CEA: CEA: 169 ng/mL — ABNORMAL HIGH (ref 0.0–4.7)

## 2024-06-18 MED ORDER — PEGFILGRASTIM-CBQV 6 MG/0.6ML ~~LOC~~ SOSY
6.0000 mg | PREFILLED_SYRINGE | Freq: Once | SUBCUTANEOUS | Status: AC
  Administered 2024-06-18: 6 mg via SUBCUTANEOUS
  Filled 2024-06-18: qty 0.6

## 2024-06-18 MED ORDER — SODIUM CHLORIDE 0.9% FLUSH: 10.0000 mL | INTRAVENOUS | Status: DC | PRN

## 2024-06-18 MED ORDER — HEPARIN SOD (PORK) LOCK FLUSH 100 UNIT/ML IV SOLN: 500.0000 [IU] | Freq: Once | INTRAVENOUS | Status: DC | PRN

## 2024-06-18 NOTE — Patient Instructions (Signed)
 [facility] [place] - A DEPT OF [place]  Discharge Instructions: Thank you for choosing [facility] to provide your oncology and hematology care.  If you have a lab appointment with the Cancer Center - please note that after April 8th, 2024, all labs will be drawn in the cancer center.  You do not have to check in or register with the main entrance as you have in the past but will complete your check-in in the cancer center.  Wear comfortable clothing and clothing appropriate for easy access to any Portacath or PICC line.   We strive to give you quality time with your provider. You may need to reschedule your appointment if you arrive late (15 or more minutes).  Arriving late affects you and other patients whose appointments are after yours.  Also, if you miss three or more appointments without notifying the office, you may be dismissed from the clinic at the provider's discretion.      For prescription refill requests, have your pharmacy contact our office and allow 72 hours for refills to be completed.    Today you received the following Filgrastim  injection, return as scheduled.   To help prevent nausea and vomiting after your treatment, we encourage you to take your nausea medication as directed.  BELOW ARE SYMPTOMS THAT SHOULD BE REPORTED IMMEDIATELY: *FEVER GREATER THAN 100.4 F (38 C) OR HIGHER *CHILLS OR SWEATING *NAUSEA AND VOMITING THAT IS NOT CONTROLLED WITH YOUR NAUSEA MEDICATION *UNUSUAL SHORTNESS OF BREATH *UNUSUAL BRUISING OR BLEEDING *URINARY PROBLEMS (pain or burning when urinating, or frequent urination) *BOWEL PROBLEMS (unusual diarrhea, constipation, pain near the anus) TENDERNESS IN MOUTH AND THROAT WITH OR WITHOUT PRESENCE OF ULCERS (sore throat, sores in mouth, or a toothache) UNUSUAL RASH, SWELLING OR PAIN  UNUSUAL VAGINAL DISCHARGE OR ITCHING   Items with * indicate a potential emergency and should be followed up as soon as  possible or go to the Emergency Department if any problems should occur.  Please show the CHEMOTHERAPY ALERT CARD or IMMUNOTHERAPY ALERT CARD at check-in to the Emergency Department and triage nurse.  Should you have questions after your visit or need to cancel or reschedule your appointment, please contact [facility]CER CTR [place] - A DEPT OF [name] [phone]  and follow the prompts.  Office hours are 8:00 a.m. to 4:30 p.m. Monday - Friday. Please note that voicemails left after 4:00 p.m. may not be returned until the following business day.  We are closed weekends and major holidays. You have access to a nurse at all times for urgent questions. Please call the main number to the clinic [phone] and follow the prompts.  For any non-urgent questions, you may also contact your provider using MyChart. We now offer e-Visits for anyone 34 and older to request care online for non-urgent symptoms. For details visit [email or web address].   Also download the MyChart app! Go to the app store, search MyChart, open the app, select [place], and log in with your MyChart username and password.

## 2024-06-18 NOTE — Progress Notes (Signed)
 Patient presents today for pump d/c patient states pump was finished today then about an hour later his needle came out. He states he didn't see any chemo leak out. No skin changes noted, no redness, itching, burning or pain at site. Patient's port site cleaned with soap and water . Dr. Katragadda made aware with no interventions noted. Patient instructed to call cancer center if changes to site occurs.   Patient tolerated injection with no complaints voiced. Site clean and dry with no bruising or swelling noted at site. See MAR for details. Band aid applied.  Patient stable during and after injection. VSS with discharge and left in satisfactory condition with no s/s of distress noted.

## 2024-06-20 ENCOUNTER — Other Ambulatory Visit: Payer: Self-pay | Admitting: Hematology

## 2024-06-25 ENCOUNTER — Ambulatory Visit (HOSPITAL_COMMUNITY)
Admission: RE | Admit: 2024-06-25 | Discharge: 2024-06-25 | Disposition: A | Discharge status: Home / Self Care | Source: Ambulatory Visit | Attending: Hematology | Admitting: Hematology

## 2024-06-25 DIAGNOSIS — C771 Secondary and unspecified malignant neoplasm of intrathoracic lymph nodes: Secondary | ICD-10-CM | POA: Diagnosis not present

## 2024-06-25 DIAGNOSIS — K7469 Other cirrhosis of liver: Secondary | ICD-10-CM | POA: Diagnosis not present

## 2024-06-25 DIAGNOSIS — C7802 Secondary malignant neoplasm of left lung: Secondary | ICD-10-CM | POA: Diagnosis not present

## 2024-06-25 DIAGNOSIS — C180 Malignant neoplasm of cecum: Secondary | ICD-10-CM | POA: Insufficient documentation

## 2024-06-25 MED ORDER — IOHEXOL 300 MG/ML  SOLN
100.0000 mL | Freq: Once | INTRAMUSCULAR | Status: AC | PRN
  Administered 2024-06-25: 100 mL via INTRAVENOUS

## 2024-06-26 ENCOUNTER — Encounter: Payer: Self-pay | Admitting: *Deleted

## 2024-06-26 ENCOUNTER — Ambulatory Visit: Admitting: Internal Medicine

## 2024-06-26 VITALS — BP 138/83 | HR 70 | Temp 98.4°F | Ht 70.0 in | Wt 174.6 lb

## 2024-06-26 DIAGNOSIS — I8500 Esophageal varices without bleeding: Secondary | ICD-10-CM | POA: Diagnosis not present

## 2024-06-26 DIAGNOSIS — K766 Portal hypertension: Secondary | ICD-10-CM

## 2024-06-26 DIAGNOSIS — C7800 Secondary malignant neoplasm of unspecified lung: Secondary | ICD-10-CM | POA: Diagnosis not present

## 2024-06-26 DIAGNOSIS — Z8619 Personal history of other infectious and parasitic diseases: Secondary | ICD-10-CM | POA: Diagnosis not present

## 2024-06-26 DIAGNOSIS — Z85038 Personal history of other malignant neoplasm of large intestine: Secondary | ICD-10-CM | POA: Diagnosis not present

## 2024-06-26 DIAGNOSIS — Z87891 Personal history of nicotine dependence: Secondary | ICD-10-CM | POA: Diagnosis not present

## 2024-06-26 DIAGNOSIS — C180 Malignant neoplasm of cecum: Secondary | ICD-10-CM

## 2024-06-26 NOTE — H&P (View-Only) (Signed)
 Primary Care Physician:  Jolee Elsie RAMAN, PA Primary Gastroenterologist:  Dr. Cindie  Chief Complaint  Patient presents with   New Patient (Initial Visit)    Pt referred by Dr MARLA    HPI:   Evan [name] is a 68 y.o. male who presents to clinic today for follow-up visit.  Last seen in our office 02/21/2023.  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    Initially referred for EGD for variceal screening due to portal hypertension seen on imaging.  Underwent EGD 03/12/23 which showed no evidence of esophageal varices or gastric varices.  CT chest abdomen pelvis 03/31/2024 which I personally reviewed showed stable extensive lung metastases, evidence of chronic liver disease with hepatosplenomegaly and lower esophageal and gastric varices, no ascites.  Due for repeat EGD now to reevaluate for variceal disease.  Also history of chronic hepatitis C treated with Harvoni with subsequent SVR.  Most recent imaging showed nodular contour of the liver.  Previous liver care: 1. Hepatitis B immunity: #1 HEPLISAV administered 11/12/2018, #2 HEPLISAV administered 01/06/2019 2. HIV negative 3. Harvoni start Date: 10/08/2018 - completion date: 12/02/2018. Viral load WNL May 2020 4. FIBROSCAN/2019: 8.6 kPa consistent with stage F2 moderate fibrosis. 5. Hepatitis A immunity status: Hep A IgG +    Past Medical History:  Diagnosis Date   Arthritis    Colon cancer (HCC)    colon   Hepatitis C    Hypertension    Port-A-Cath in place 02/15/2022    Past Surgical History:  Procedure Laterality Date   BIOPSY  03/12/2023   Procedure: BIOPSY;  Surgeon: Cindie Carlin MARLA, DO;  [facility];  Service: Endoscopy;;   COLON SURGERY     ESOPHAGEAL BANDING N/A 03/12/2023   Procedure: ESOPHAGEAL BANDING;  Surgeon: Cindie Carlin MARLA, DO;  Location: [facility] END[facility]  SUITE;  Service: Endoscopy;  Laterality: N/A;   ESOPHAGOGASTRODUODENOSCOPY (EGD) WITH PROPOFOL  N/A 03/12/2023   Procedure: ESOPHAGOGASTRODUODENOSCOPY (EGD) WITH PROPOFOL ;  Surgeon: Cindie Carlin MARLA, DO;  Location: [facility];  Service: Endoscopy;  Laterality: N/A;  11:30AM; ASA 3   PORTACATH PLACEMENT Right 02/08/2022   Procedure: INSERTION PORT-A-CATH- RIJ;  Surgeon: Evonnie Dorothyann LABOR, DO;  Location: [facility] ORS;  Service: General;  Laterality: Right;   REPLACEMENT TOTAL KNEE Left    SHOULDER ARTHROSCOPY Bilateral    TOTAL HIP ARTHROPLASTY Right 11/09/2020   Procedure: TOTAL HIP ARTHROPLASTY ANTERIOR APPROACH;  Surgeon: Beverley Evalene BIRCH, MD;  Location: [facility] ORS;  Service: Orthopedics;  Laterality: Right;   WRIST SURGERY Left     Current Outpatient Medications  Medication Sig Dispense Refill   aluminum -magnesium  hydroxide-simethicone  (MAALOX) 200-200-20 MG/5ML SUSP Take 30 mLs by mouth 4 (four) times daily -  before meals and at bedtime. 1680 mL 2   amLODipine  (NORVASC ) 10 MG tablet Take 1 tablet (10 mg total) by mouth daily. 90 tablet 3   Bevacizumab  (AVASTIN  IV) Inject into the vein every 14 (fourteen) days. *start date TBD     ENULOSE  10 GM/15ML SOLN Take 10 g by mouth daily as needed.     fluorouracil  CALGB 19297 2,400 mg/m2 in sodium chloride  0.9 % 150 mL Inject 2,400 mg/m2 into the vein over 48 hr.     FLUOROURACIL  IV Inject into the vein every 14 (fourteen) days.     Lactulose  20 GM/30ML SOLN Take 15 mLs (10  g total) by mouth at bedtime. Take 15 ml at bedtime every night to assist with regular bowel movements.  Titrate down if having multiple bowel movements.  If a bowel movement has not occurred in 3 to 4 days or longer, then take 15 ml every 3 hours until a bowel movent has occurred. 450 mL 5   LEUCOVORIN  CALCIUM  IV Inject into the vein every 14 (fourteen) days.     lidocaine -prilocaine  (EMLA ) cream Apply 1 Application topically as needed (Apply to port site 30 mins- 1 hour prior to  treatment/labs.). 30 g 0   lisinopril  (ZESTRIL ) 10 MG tablet TAKE ONE TABLET BY MOUTH DAILY 30 tablet 3   loperamide  (IMODIUM ) 2 MG capsule TAKE 2 CAPSULES BY MOUTH AFTER FIRST LOOSE STOOL,AND THEN 1 CAPSULE AFTER 2ND LOOSE STOOL. DO NOT EXCEED 8 CAPS IN 24 HOURS 30 capsule 0   megestrol  (MEGACE ) 400 MG/10ML suspension Take 10 mLs (400 mg total) by mouth 2 (two) times daily. 480 mL 3   Misc. Devices MISC Please provide patient with 1:1 ratio of magic mouthwash and lidocaine  to swish and swallow QID 1 each 3   naloxone  (NARCAN ) nasal spray 4 mg/0.1 mL 0.1ml nasal spray in one nostril. May repeat the dose in other nostril in 2-3 minutes if needed. 2 each 3   Oxycodone  HCl 10 MG TABS Take 1 tablet (10 mg total) by mouth every 4 (four) hours as needed. 180 tablet 0   pantoprazole  (PROTONIX ) 40 MG tablet Take 1 tablet (40 mg total) by mouth daily. 30 tablet 11   potassium chloride  SA (KLOR-CON  M) 20 MEQ tablet Take 1 tablet (20 mEq total) by mouth daily. 90 tablet 4   prochlorperazine  (COMPAZINE ) 10 MG tablet Take 1 tablet (10 mg total) by mouth every 6 (six) hours as needed (NAUSEA). 60 tablet 2   No current facility-administered medications for this visit.   Facility-Administered Medications Ordered in Other Visits  Medication Dose Route Frequency Provider Last Rate Last Admin   potassium chloride  SA (KLOR-CON  M) CR tablet 40 mEq  40 mEq Oral Once Katragadda, Sreedhar, MD        Allergies as of 06/26/2024   (No Known Allergies)    Family History  Problem Relation Age of Onset   Heart disease Mother    Dementia Father    Heart disease Sister    Cancer Brother    Hypertension Brother    Hypertension Brother    Healthy Son    Healthy Son    Healthy Son    Healthy Son    Healthy Daughter    Healthy Daughter    Healthy Daughter     Social History   Socioeconomic History   Marital status: Married    Spouse name: Not on file   Number of children: 7   Years of education: Not on file    Highest education level: Not on file  Occupational History   Occupation: employed  Tobacco Use   Smoking status: Former    Current packs/day: 0.00    Average packs/day: 1.5 packs/day for 20.0 years (30.0 ttl pk-yrs)    Types: Cigarettes    Start date: 11/19/1985    Quit date: 11/19/2005    Years since quitting: 18.6   Smokeless tobacco: Never  Vaping Use   Vaping status: Never Used  Substance and Sexual Activity   Alcohol  use: Not Currently   Drug use: Never   Sexual activity: Yes  Other Topics Concern   Not  on file  Social History Narrative   ** Merged History Encounter **       Separated from wife 11/2021   Social Drivers of Health   Financial Resource Strain: Low Risk  (10/07/2019)   Overall Financial Resource Strain (CARDIA)    Difficulty of Paying Living Expenses: Not very hard  Food Insecurity: No Food Insecurity (10/07/2019)   Hunger Vital Sign    Worried About Running Out of Food in the Last Year: Never true    Ran Out of Food in the Last Year: Never true  Transportation Needs: No Transportation Needs (10/07/2019)   PRAPARE - Administrator, Civil Service (Medical): No    Lack of Transportation (Non-Medical): No  Physical Activity: Inactive (10/07/2019)   Exercise Vital Sign    Days of Exercise per Week: 0 days    Minutes of Exercise per Session: 0 min  Stress: No Stress Concern Present (10/07/2019)   [employer] of Occupational Health - Occupational Stress Questionnaire    Feeling of Stress : Not at all  Social Connections: Moderately Integrated (10/07/2019)   Social Connection and Isolation Panel    Frequency of Communication with Friends and Family: Once a week    Frequency of Social Gatherings with Friends and Family: Once a week    Attends Religious Services: More than 4 times per year    Active Member of [employer] or Organizations: Yes    Attends Engineer, structural: More than 4 times per year    Marital Status: Married   Catering manager Violence: Not At Risk (10/07/2019)   Humiliation, Afraid, Rape, and Kick questionnaire    Fear of Current or Ex-Partner: No    Emotionally Abused: No    Physically Abused: No    Sexually Abused: No    Subjective: Review of Systems  Constitutional:  Negative for chills and fever.  HENT:  Negative for congestion and hearing loss.   Eyes:  Negative for blurred vision and double vision.  Respiratory:  Negative for cough and shortness of breath.   Cardiovascular:  Negative for chest pain and palpitations.  Gastrointestinal:  Negative for abdominal pain, blood in stool, constipation, diarrhea, heartburn, melena and vomiting.  Genitourinary:  Negative for dysuria and urgency.  Musculoskeletal:  Negative for joint pain and myalgias.  Skin:  Negative for itching and rash.  Neurological:  Negative for dizziness and headaches.  Psychiatric/Behavioral:  Negative for depression. The patient is not nervous/anxious.        Objective: There were no vitals taken for this visit. Physical Exam Constitutional:      Appearance: Normal appearance.  HENT:     Head: Normocephalic and atraumatic.  Eyes:     Extraocular Movements: Extraocular movements intact.     Conjunctiva/sclera: Conjunctivae normal.  Cardiovascular:     Rate and Rhythm: Normal rate and regular rhythm.  Pulmonary:     Effort: Pulmonary effort is normal.     Breath sounds: Normal breath sounds.  Abdominal:     General: Bowel sounds are normal.     Palpations: Abdomen is soft.  Musculoskeletal:        General: Normal range of motion.     Cervical back: Normal range of motion and neck supple.  Skin:    General: Skin is warm.  Neurological:     General: No focal deficit present.     Mental Status: He is alert and oriented to person, place, and time.  Psychiatric:  Mood and Affect: Mood normal.        Behavior: Behavior normal.      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  Possible he has esophageal varices/portal hypertension related to his Bevacizumab .  Discussed treatment options in depth today.  Will schedule for upper endoscopy to further evaluate.  If he has esophageal varices amenable to banding will proceed with EVL.   The risks including infection, bleed, or perforation as well as benefits, limitations, alternatives and imponderables have been reviewed with the patient. Potential for esophageal dilation, biopsy, etc. have also been reviewed.  Questions have been answered. All parties agreeable.  Patient denies having surveillance colonoscopy after his surgery.  I would be happy to perform this as well, patient to discuss with Dr. Rogers further.  Further recommendations after endoscopic evaluation.    06/26/2024 1:39 PM   Disclaimer: This note was dictated with voice recognition software. Similar sounding words can inadvertently be transcribed and may not be corrected upon review.

## 2024-06-26 NOTE — Patient Instructions (Signed)
 We will schedule you for upper endoscopy to evaluate for esophageal varices.  I may elect to treat these with banding versus medication depending on size if you do in fact have them.   It was very nice seeing you again today   Dr. Cindie

## 2024-06-26 NOTE — Progress Notes (Signed)
 Primary Care Physician:  Jolee Elsie RAMAN, PA Primary Gastroenterologist:  Dr. Cindie  Chief Complaint  Patient presents with   New Patient (Initial Visit)    Pt referred by Dr MARLA    HPI:   Evan [name] is a 68 y.o. male who presents to clinic today for follow-up visit.  Last seen in our office 02/21/2023.  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    Initially referred for EGD for variceal screening due to portal hypertension seen on imaging.  Underwent EGD 03/12/23 which showed no evidence of esophageal varices or gastric varices.  CT chest abdomen pelvis 03/31/2024 which I personally reviewed showed stable extensive lung metastases, evidence of chronic liver disease with hepatosplenomegaly and lower esophageal and gastric varices, no ascites.  Due for repeat EGD now to reevaluate for variceal disease.  Also history of chronic hepatitis C treated with Harvoni with subsequent SVR.  Most recent imaging showed nodular contour of the liver.  Previous liver care: 1. Hepatitis B immunity: #1 HEPLISAV administered 11/12/2018, #2 HEPLISAV administered 01/06/2019 2. HIV negative 3. Harvoni start Date: 10/08/2018 - completion date: 12/02/2018. Viral load WNL May 2020 4. FIBROSCAN/2019: 8.6 kPa consistent with stage F2 moderate fibrosis. 5. Hepatitis A immunity status: Hep A IgG +    Past Medical History:  Diagnosis Date   Arthritis    Colon cancer (HCC)    colon   Hepatitis C    Hypertension    Port-A-Cath in place 02/15/2022    Past Surgical History:  Procedure Laterality Date   BIOPSY  03/12/2023   Procedure: BIOPSY;  Surgeon: Cindie Carlin MARLA, DO;  Location: [facility];  Service: Endoscopy;;   COLON SURGERY     ESOPHAGEAL BANDING N/A 03/12/2023   Procedure: ESOPHAGEAL BANDING;  Surgeon: Cindie Carlin MARLA, DO;  Location: [facility];  Service: Endoscopy;  Laterality: N/A;   ESOPHAGOGASTRODUODENOSCOPY (EGD) WITH PROPOFOL  N/A 03/12/2023   Procedure: ESOPHAGOGASTRODUODENOSCOPY (EGD) WITH PROPOFOL ;  Surgeon: Cindie Carlin MARLA, DO;  Location: [facility];  Service: Endoscopy;  Laterality: N/A;  11:30AM; ASA 3   PORTACATH PLACEMENT Right 02/08/2022   Procedure: INSERTION PORT-A-CATH- RIJ;  Surgeon: Evonnie Dorothyann LABOR, DO;  Location: [facility];  Service: General;  Laterality: Right;   REPLACEMENT TOTAL KNEE Left    SHOULDER ARTHROSCOPY Bilateral    TOTAL HIP ARTHROPLASTY Right 11/09/2020   Procedure: TOTAL HIP ARTHROPLASTY ANTERIOR APPROACH;  Surgeon: Beverley Evalene BIRCH, MD;  Location: [facility];  Service: Orthopedics;  Laterality: Right;   WRIST SURGERY Left     Current Outpatient Medications  Medication Sig Dispense Refill   aluminum -magnesium  hydroxide-simethicone  (MAALOX) 200-200-20 MG/5ML SUSP Take 30 mLs by mouth 4 (four) times daily -  before meals and at bedtime. 1680 mL 2   amLODipine  (NORVASC ) 10 MG tablet Take 1 tablet (10 mg total) by mouth daily. 90 tablet 3   Bevacizumab  (AVASTIN  IV) Inject into the vein every 14 (fourteen) days. *start date TBD     ENULOSE  10 GM/15ML SOLN Take 10 g by mouth daily as needed.     fluorouracil  CALGB 19297 2,400 mg/m2 in sodium chloride  0.9 % 150 mL Inject 2,400 mg/m2 into the vein over 48 hr.     FLUOROURACIL  IV Inject into the vein every 14 (fourteen) days.     Lactulose  20 GM/30ML SOLN Take 15 mLs (10  g total) by mouth at bedtime. Take 15 ml at bedtime every night to assist with regular bowel movements.  Titrate down if having multiple bowel movements.  If a bowel movement has not occurred in 3 to 4 days or longer, then take 15 ml every 3 hours until a bowel movent has occurred. 450 mL 5   LEUCOVORIN  CALCIUM  IV Inject into the vein every 14 (fourteen) days.     lidocaine -prilocaine  (EMLA ) cream Apply 1 Application topically as needed (Apply to port site 30 mins- 1 hour prior to  treatment/labs.). 30 g 0   lisinopril  (ZESTRIL ) 10 MG tablet TAKE ONE TABLET BY MOUTH DAILY 30 tablet 3   loperamide  (IMODIUM ) 2 MG capsule TAKE 2 CAPSULES BY MOUTH AFTER FIRST LOOSE STOOL,AND THEN 1 CAPSULE AFTER 2ND LOOSE STOOL. DO NOT EXCEED 8 CAPS IN 24 HOURS 30 capsule 0   megestrol  (MEGACE ) 400 MG/10ML suspension Take 10 mLs (400 mg total) by mouth 2 (two) times daily. 480 mL 3   Misc. Devices MISC Please provide patient with 1:1 ratio of magic mouthwash and lidocaine  to swish and swallow QID 1 each 3   naloxone  (NARCAN ) nasal spray 4 mg/0.1 mL 0.1ml nasal spray in one nostril. May repeat the dose in other nostril in 2-3 minutes if needed. 2 each 3   Oxycodone  HCl 10 MG TABS Take 1 tablet (10 mg total) by mouth every 4 (four) hours as needed. 180 tablet 0   pantoprazole  (PROTONIX ) 40 MG tablet Take 1 tablet (40 mg total) by mouth daily. 30 tablet 11   potassium chloride  SA (KLOR-CON  M) 20 MEQ tablet Take 1 tablet (20 mEq total) by mouth daily. 90 tablet 4   prochlorperazine  (COMPAZINE ) 10 MG tablet Take 1 tablet (10 mg total) by mouth every 6 (six) hours as needed (NAUSEA). 60 tablet 2   No current facility-administered medications for this visit.   Facility-Administered Medications Ordered in Other Visits  Medication Dose Route Frequency Provider Last Rate Last Admin   potassium chloride  SA (KLOR-CON  M) CR tablet 40 mEq  40 mEq Oral Once Katragadda, Sreedhar, MD        Allergies as of 06/26/2024   (No Known Allergies)    Family History  Problem Relation Age of Onset   Heart disease Mother    Dementia Father    Heart disease Sister    Cancer Brother    Hypertension Brother    Hypertension Brother    Healthy Son    Healthy Son    Healthy Son    Healthy Son    Healthy Daughter    Healthy Daughter    Healthy Daughter     Social History   Socioeconomic History   Marital status: Married    Spouse name: Not on file   Number of children: 7   Years of education: Not on file    Highest education level: Not on file  Occupational History   Occupation: employed  Tobacco Use   Smoking status: Former    Current packs/day: 0.00    Average packs/day: 1.5 packs/day for 20.0 years (30.0 ttl pk-yrs)    Types: Cigarettes    Start date: 11/19/1985    Quit date: 11/19/2005    Years since quitting: 18.6   Smokeless tobacco: Never  Vaping Use   Vaping status: Never Used  Substance and Sexual Activity   Alcohol  use: Not Currently   Drug use: Never   Sexual activity: Yes  Other Topics Concern   Not  on file  Social History Narrative   ** Merged History Encounter **       Separated from wife 11/2021   Social Drivers of Health   Financial Resource Strain: Low Risk  (10/07/2019)   Overall Financial Resource Strain (CARDIA)    Difficulty of Paying Living Expenses: Not very hard  Food Insecurity: No Food Insecurity (10/07/2019)   Hunger Vital Sign    Worried About Running Out of Food in the Last Year: Never true    Ran Out of Food in the Last Year: Never true  Transportation Needs: No Transportation Needs (10/07/2019)   PRAPARE - Administrator, Civil Service (Medical): No    Lack of Transportation (Non-Medical): No  Physical Activity: Inactive (10/07/2019)   Exercise Vital Sign    Days of Exercise per Week: 0 days    Minutes of Exercise per Session: 0 min  Stress: No Stress Concern Present (10/07/2019)   [employer] of Occupational Health - Occupational Stress Questionnaire    Feeling of Stress : Not at all  Social Connections: Moderately Integrated (10/07/2019)   Social Connection and Isolation Panel    Frequency of Communication with Friends and Family: Once a week    Frequency of Social Gatherings with Friends and Family: Once a week    Attends Religious Services: More than 4 times per year    Active Member of [employer] or Organizations: Yes    Attends Engineer, structural: More than 4 times per year    Marital Status: Married   Catering manager Violence: Not At Risk (10/07/2019)   Humiliation, Afraid, Rape, and Kick questionnaire    Fear of Current or Ex-Partner: No    Emotionally Abused: No    Physically Abused: No    Sexually Abused: No    Subjective: Review of Systems  Constitutional:  Negative for chills and fever.  HENT:  Negative for congestion and hearing loss.   Eyes:  Negative for blurred vision and double vision.  Respiratory:  Negative for cough and shortness of breath.   Cardiovascular:  Negative for chest pain and palpitations.  Gastrointestinal:  Negative for abdominal pain, blood in stool, constipation, diarrhea, heartburn, melena and vomiting.  Genitourinary:  Negative for dysuria and urgency.  Musculoskeletal:  Negative for joint pain and myalgias.  Skin:  Negative for itching and rash.  Neurological:  Negative for dizziness and headaches.  Psychiatric/Behavioral:  Negative for depression. The patient is not nervous/anxious.        Objective: There were no vitals taken for this visit. Physical Exam Constitutional:      Appearance: Normal appearance.  HENT:     Head: Normocephalic and atraumatic.  Eyes:     Extraocular Movements: Extraocular movements intact.     Conjunctiva/sclera: Conjunctivae normal.  Cardiovascular:     Rate and Rhythm: Normal rate and regular rhythm.  Pulmonary:     Effort: Pulmonary effort is normal.     Breath sounds: Normal breath sounds.  Abdominal:     General: Bowel sounds are normal.     Palpations: Abdomen is soft.  Musculoskeletal:        General: Normal range of motion.     Cervical back: Normal range of motion and neck supple.  Skin:    General: Skin is warm.  Neurological:     General: No focal deficit present.     Mental Status: He is alert and oriented to person, place, and time.  Psychiatric:  Mood and Affect: Mood normal.        Behavior: Behavior normal.      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  Possible he has esophageal varices/portal hypertension related to his Bevacizumab .  Discussed treatment options in depth today.  Will schedule for upper endoscopy to further evaluate.  If he has esophageal varices amenable to banding will proceed with EVL.   The risks including infection, bleed, or perforation as well as benefits, limitations, alternatives and imponderables have been reviewed with the patient. Potential for esophageal dilation, biopsy, etc. have also been reviewed.  Questions have been answered. All parties agreeable.  Patient denies having surveillance colonoscopy after his surgery.  I would be happy to perform this as well, patient to discuss with Dr. Rogers further.  Further recommendations after endoscopic evaluation.    06/26/2024 1:39 PM   Disclaimer: This note was dictated with voice recognition software. Similar sounding words can inadvertently be transcribed and may not be corrected upon review.

## 2024-06-27 ENCOUNTER — Telehealth: Payer: Self-pay | Admitting: *Deleted

## 2024-06-27 NOTE — Telephone Encounter (Signed)
Called pt and is aware of pre-op appt details. 

## 2024-06-30 ENCOUNTER — Encounter: Payer: Self-pay | Admitting: Hematology

## 2024-06-30 ENCOUNTER — Inpatient Hospital Stay

## 2024-06-30 ENCOUNTER — Inpatient Hospital Stay (HOSPITAL_BASED_OUTPATIENT_CLINIC_OR_DEPARTMENT_OTHER): Admitting: Hematology

## 2024-06-30 VITALS — BP 155/87 | Wt 180.6 lb

## 2024-06-30 DIAGNOSIS — Z5111 Encounter for antineoplastic chemotherapy: Secondary | ICD-10-CM | POA: Diagnosis not present

## 2024-06-30 DIAGNOSIS — Z5112 Encounter for antineoplastic immunotherapy: Secondary | ICD-10-CM | POA: Diagnosis not present

## 2024-06-30 DIAGNOSIS — Z95828 Presence of other vascular implants and grafts: Secondary | ICD-10-CM

## 2024-06-30 DIAGNOSIS — C7802 Secondary malignant neoplasm of left lung: Secondary | ICD-10-CM | POA: Diagnosis not present

## 2024-06-30 DIAGNOSIS — C7801 Secondary malignant neoplasm of right lung: Secondary | ICD-10-CM | POA: Diagnosis not present

## 2024-06-30 DIAGNOSIS — I10 Essential (primary) hypertension: Secondary | ICD-10-CM

## 2024-06-30 DIAGNOSIS — C180 Malignant neoplasm of cecum: Secondary | ICD-10-CM

## 2024-06-30 DIAGNOSIS — C7972 Secondary malignant neoplasm of left adrenal gland: Secondary | ICD-10-CM | POA: Diagnosis not present

## 2024-06-30 DIAGNOSIS — Z5189 Encounter for other specified aftercare: Secondary | ICD-10-CM | POA: Diagnosis not present

## 2024-06-30 LAB — COMPREHENSIVE METABOLIC PANEL WITH GFR
ALT: 10 U/L (ref 0–44)
AST: 22 U/L (ref 15–41)
Albumin: 3.3 g/dL — ABNORMAL LOW (ref 3.5–5.0)
Alkaline Phosphatase: 106 U/L (ref 38–126)
Anion gap: 10 (ref 5–15)
BUN: 12 mg/dL (ref 8–23)
CO2: 23 mmol/L (ref 22–32)
Calcium: 8.5 mg/dL — ABNORMAL LOW (ref 8.9–10.3)
Chloride: 103 mmol/L (ref 98–111)
Creatinine, Ser: 0.99 mg/dL (ref 0.61–1.24)
GFR, Estimated: >60 mL/min (ref >60)
Glucose, Bld: 132 mg/dL — ABNORMAL HIGH (ref 70–99)
Potassium: 3.7 mmol/L (ref 3.5–5.1)
Sodium: 136 mmol/L (ref 135–145)
Total Bilirubin: 0.5 mg/dL (ref 0.0–1.2)
Total Protein: 6.9 g/dL (ref 6.5–8.1)

## 2024-06-30 LAB — CBC WITH DIFFERENTIAL/PLATELET
Abs Immature Granulocytes: 0.26 K/uL — ABNORMAL HIGH (ref 0.00–0.07)
Basophils Absolute: 0.1 K/uL (ref 0.0–0.1)
Basophils Relative: 1 %
Eosinophils Absolute: 0.2 K/uL (ref 0.0–0.5)
Eosinophils Relative: 2 %
HCT: 35.8 % — ABNORMAL LOW (ref 39.0–52.0)
Hemoglobin: 11.1 g/dL — ABNORMAL LOW (ref 13.0–17.0)
Immature Granulocytes: 2 %
Lymphocytes Relative: 17 %
Lymphs Abs: 2 K/uL (ref 0.7–4.0)
MCH: 29.9 pg (ref 26.0–34.0)
MCHC: 31 g/dL (ref 30.0–36.0)
MCV: 96.5 fL (ref 80.0–100.0)
Monocytes Absolute: 1.2 K/uL — ABNORMAL HIGH (ref 0.1–1.0)
Monocytes Relative: 10 %
Neutro Abs: 7.9 K/uL — ABNORMAL HIGH (ref 1.7–7.7)
Neutrophils Relative %: 68 %
Platelets: 152 K/uL (ref 150–400)
RBC: 3.71 MIL/uL — ABNORMAL LOW (ref 4.22–5.81)
RDW: 18 % — ABNORMAL HIGH (ref 11.5–15.5)
WBC: 11.6 K/uL — ABNORMAL HIGH (ref 4.0–10.5)
nRBC: 0 % (ref 0.0–0.2)

## 2024-06-30 LAB — URINALYSIS, DIPSTICK ONLY
Bilirubin Urine: NEGATIVE
Glucose, UA: NEGATIVE mg/dL
Hgb urine dipstick: NEGATIVE
Ketones, ur: NEGATIVE mg/dL
Leukocytes,Ua: NEGATIVE
Nitrite: NEGATIVE
Protein, ur: 100 mg/dL — AB
Specific Gravity, Urine: 1.017 (ref 1.005–1.030)
pH: 5 (ref 5.0–8.0)

## 2024-06-30 LAB — MAGNESIUM: Magnesium: 1.9 mg/dL (ref 1.7–2.4)

## 2024-06-30 MED ORDER — SODIUM CHLORIDE 0.9% FLUSH
10.0000 mL | INTRAVENOUS | Status: AC
  Administered 2024-06-30: 10 mL via INTRAVENOUS

## 2024-06-30 MED ORDER — SODIUM CHLORIDE 0.9 % IV SOLN: Freq: Once | INTRAVENOUS | Status: AC

## 2024-06-30 MED ORDER — FLUOROURACIL CHEMO INJECTION 2.5 GM/50ML
320.0000 mg/m2 | Freq: Once | INTRAVENOUS | Status: AC
  Administered 2024-06-30: 600 mg via INTRAVENOUS
  Filled 2024-06-30: qty 12

## 2024-06-30 MED ORDER — AMLODIPINE BESYLATE 10 MG PO TABS: 10.0000 mg | ORAL_TABLET | Freq: Every day | ORAL | 3 refills | Status: DC

## 2024-06-30 MED ORDER — PALONOSETRON HCL INJECTION 0.25 MG/5ML
0.2500 mg | Freq: Once | INTRAVENOUS | Status: AC
  Administered 2024-06-30: 0.25 mg via INTRAVENOUS
  Filled 2024-06-30: qty 5

## 2024-06-30 MED ORDER — MEGESTROL ACETATE 400 MG/10ML PO SUSP
400.0000 mg | Freq: Two times a day (BID) | ORAL | 3 refills | Status: DC
Start: 2024-06-30 — End: 2024-10-13

## 2024-06-30 MED ORDER — SODIUM CHLORIDE 0.9 % IV SOLN
1920.0000 mg/m2 | INTRAVENOUS | Status: DC
  Administered 2024-06-30: 3500 mg via INTRAVENOUS
  Filled 2024-06-30: qty 70

## 2024-06-30 MED ORDER — SODIUM CHLORIDE 0.9 % IV SOLN
400.0000 mg/m2 | Freq: Once | INTRAVENOUS | Status: AC
  Administered 2024-06-30: 780 mg via INTRAVENOUS
  Filled 2024-06-30: qty 39

## 2024-06-30 MED ORDER — SODIUM CHLORIDE 0.9 % IV SOLN
144.0000 mg/m2 | Freq: Once | INTRAVENOUS | Status: AC
  Administered 2024-06-30: 300 mg via INTRAVENOUS
  Filled 2024-06-30: qty 15

## 2024-06-30 MED ORDER — SODIUM CHLORIDE 0.9 % IV SOLN
150.0000 mg | Freq: Once | INTRAVENOUS | Status: AC
  Administered 2024-06-30: 150 mg via INTRAVENOUS
  Filled 2024-06-30: qty 150

## 2024-06-30 MED ORDER — ATROPINE SULFATE 1 MG/ML IV SOLN
1.0000 mg | Freq: Once | INTRAVENOUS | Status: AC
  Administered 2024-06-30: 1 mg via INTRAVENOUS
  Filled 2024-06-30: qty 1

## 2024-06-30 MED ORDER — DEXAMETHASONE SODIUM PHOSPHATE 10 MG/ML IJ SOLN
10.0000 mg | Freq: Once | INTRAMUSCULAR | Status: AC
  Administered 2024-06-30: 10 mg via INTRAVENOUS
  Filled 2024-06-30: qty 1

## 2024-06-30 MED ORDER — DEXAMETHASONE 0.5 MG/5ML PO SOLN
ORAL | 3 refills | Status: AC
Start: 2024-06-30 — End: ?

## 2024-06-30 NOTE — Progress Notes (Signed)
 Patient tolerated chemotherapy with no complaints voiced.  Side effects with management reviewed with understanding verbalized.  Port site clean and dry with no bruising or swelling noted at site.  Good blood return noted before and after administration of chemotherapy.  5 FU pump started for home use. Pt due to return to clinic 07/02/24 for pump stop. Patient left in satisfactory condition with VSS and no s/s of distress noted. All follow ups as scheduled.   Arnold Kester

## 2024-06-30 NOTE — Patient Instructions (Signed)
[facility] at [facility] Discharge Instructions   You were seen and examined today by Dr. Ellin Saba.  He reviewed the results of your lab work which are normal/stable.   He reviewed the results of your CT scan which shows the cancer is stable. It has not grown or spread.   We will proceed with your treatment today.   Return as scheduled.    Thank you for choosing [facility] at [facility] to provide your oncology and hematology care.  To afford each patient quality time with our provider, please arrive at least 15 minutes before your scheduled appointment time.   If you have a lab appointment with the Cancer Center please come in thru the Main Entrance and check in at the main information desk.  You need to re-schedule your appointment should you arrive 10 or more minutes late.  We strive to give you quality time with our providers, and arriving late affects you and other patients whose appointments are after yours.  Also, if you no show three or more times for appointments you may be dismissed from the clinic at the providers discretion.     Again, thank you for choosing [facility].  Our hope is that these requests will decrease the amount of time that you wait before being seen by our physicians.       _____________________________________________________________  Should you have questions after your visit to [facility], please contact our office at [phone] and follow the prompts.  Our office hours are 8:00 a.m. and 4:30 p.m. Monday - Friday.  Please note that voicemails left after 4:00 p.m. may not be returned until the following business day.  We are closed weekends and major holidays.  You do have access to a nurse 24-7, just call the main number to the clinic [phone] and do not press any options, hold on the line and a nurse will answer the phone.    For prescription refill requests, have your  pharmacy contact our office and allow 72 hours.    Due to Covid, you will need to wear a mask upon entering the hospital. If you do not have a mask, a mask will be given to you at the Main Entrance upon arrival. For doctor visits, patients may have 1 support person age 23 or older with them. For treatment visits, patients can not have anyone with them due to social distancing guidelines and our immunocompromised population.

## 2024-06-30 NOTE — Progress Notes (Signed)
 [facility] [address] [place] [address]    Clinic Day:  06/30/2024  Referring physician: Jolee Elsie RAMAN, PA  Patient Care Team: Jolee Elsie RAMAN, [name] as PCP - General (Physician Assistant) [name], MD as Medical Oncologist (Medical Oncology) Celestia Joesph SQUIBB, RN as Oncology Nurse Navigator (Medical Oncology)   ASSESSMENT & PLAN:   Assessment: 1.  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 - 30 pound weight loss in the last couple of years, weight stable over the last 6 months. - CT chest with contrast on 12/16/2021 showed bulky left hilar mass measuring 8.1 x 7.5 cm.  Numerous bilateral lung nodules of varying sizes.  Left adrenal nodule measuring 2.8 x 2.2 cm.  Mediastinal and bilateral hilar adenopathy with the largest pretracheal node measuring 4.1 x 3.1 cm. - MRI of the brain from 01/04/2022 which was negative for metastatic disease. - CTAP from 01/03/2022 which showed isolated left adrenal metastasis with no other evidence of metastatic disease in the abdomen or pelvis. - Pathology of left lung biopsy which shows adenocarcinoma with necrosis.  CK20 positive and CDX2 positive but negative for CK7 indicating colonic primary. 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  HER2 negative.  TMB low.  MSI-stable.  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  Other targetable mutations negative. - FOLFIRI started on 02/22/2022, bevacizumab  added with cycle 4  - CT CAP (05/17/2022): Mediastinal and hilar lymph nodes have decreased in size.  Largest perihilar left lower lobe lung mass has decreased in size.  Right upper lobe mass slightly increased in size.  Other nodules are stable.  Left adrenal mass decreased to 1.3 cm from 2.8 cm. - CT scan showed mixed response as it was compared to CT from 12/16/2021.  He did not start chemotherapy until 02/22/2022. - Maintenance 5-FU and bevacizumab  from September 2023 through  08/29/2023 with progression - FOLFIRI and bevacizumab  started on 09/12/2023   2. Social/family history: - Lives by himself.  He paints houses for living. - He quit smoking 12 years back and started back again 1 and half year ago and smoked half pack per day.  He quit again about 1 week ago. - Father had cancer, type unknown to the patient.  Brother died of brain tumor.  3.  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  Margins negative.  No lymphovascular or perineural invasion. - Received 3 cycles of XELOX followed by Xeloda for total of 6 months.  Oxaliplatin discontinued during cycle 4 secondary to transaminitis, elevated bilirubin and thrombocytopenia.  However he was also treated for hep C with Harvoni after that.    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 - He will continue Imodium  as needed for diarrhea. - We reviewed CT CAP from 06/25/2024: Stable lung nodules, thoracic lymph nodes and left adrenal metastasis.  Cirrhosis and portal hypertension is stable. - CEA is 169 on 06/16/2024, trending down.  CBC and LFTs are stable.  UA shows protein of 100. - I have recommended continuing FOLFIRI and bevacizumab  until progression.  FOLFIRI is already dose reduced by 20%. - He is having EGD and possible banding of the varices on 07/09/2024.  Will hold bevacizumab  with today's treatment. - He reports mucositis which starts about 2 days after each treatment.  He is using Magic mouthwash.  Mucositis typically lasts about 1 week. - Will start him on dexamethasone  oral rinses twice daily for 3 to 5 days after each treatment to prevent mucositis. - Continue treatment every 2 weeks.  RTC 6 weeks for MD visit.   2.  Lower rib/epigastric pain: - Continue oxycodone  10 mg every 6 hours as needed.  Pain is well-controlled.  3.  Hypertension: - He  is taking lisinopril  10 mg daily.  His blood pressure is high at 174/98, repeated at 155/87.  He was told to start back on amlodipine  10 mg daily.  4.  Hypokalemia: - Continue K-Dur 20 milliequivalents daily.  Potassium is 3.7 today.   5.  Decreased appetite/weight loss: - He reports decreased appetite.  We will start him on Megace  twice daily.     Orders Placed This Encounter  Procedures   CEA    Standing Status:   Future    Expected Date:   07/28/2024    Expiration Date:   07/28/2025   Magnesium     Standing Status:   Future    Expected Date:   07/28/2024    Expiration Date:   07/28/2025   CBC with Differential    Standing Status:   Future    Expected Date:   07/28/2024    Expiration Date:   07/28/2025   Comprehensive metabolic panel    Standing Status:   Future    Expected Date:   07/28/2024    Expiration Date:   07/28/2025   Urinalysis, dipstick only    Standing Status:   Future    Expected Date:   07/28/2024    Expiration Date:   07/28/2025      LILLETTE Hummingbird R Teague,acting as a scribe for Alean Stands, MD.,have documented all relevant documentation on the behalf of Alean Stands, MD,as directed by  Alean Stands, MD while in the presence of Alean Stands, MD.  I, Alean Stands MD, have reviewed the above documentation for accuracy and completeness, and I agree with the above.     Alean Stands, MD   7/21/202510:17 AM  CHIEF COMPLAINT:   Diagnosis: metastatic colon cancer to the lungs and left adrenal gland    Cancer Staging  Cecal cancer [facility]) Staging form: Colon and Rectum, AJCC 8th Edition - Clinical stage from 10/07/2019: Stage IIIB (cT3, cN1, cM0) - Signed by Stands Alean, MD on 10/07/2019 - Pathologic stage from 01/23/2022: Stage IVB (rpTX, rpN0, rpM1b) - Unsigned    Prior Therapy: 1. FOLFIRI 02/22/22 - 08/08/22  2. Maintenance 5-FU and bevacizumab , 08/2022 - 08/29/23  Current Therapy:  FOLFIRI and bevacizumab      HISTORY OF PRESENT ILLNESS:   Oncology History  Cecal cancer (HCC)  10/07/2019 Initial Diagnosis   Cecal cancer (HCC)   10/07/2019 Cancer Staging   Staging form: Colon and Rectum, AJCC 8th Edition - Clinical stage from 10/07/2019: Stage IIIB (cT3, cN1, cM0) - Signed by Stands Alean, MD on 10/07/2019   02/22/2022 - 08/08/2022 Chemotherapy   Patient is on Treatment Plan : COLORECTAL FOLFIRI / BEVACIZUMAB  Q14D     02/22/2022 -  Chemotherapy   Patient is on Treatment Plan : COLORECTAL FOLFIRI + Bevacizumab  q14d        INTERVAL HISTORY:   Evan [name] is a 68 y.o. male presenting to clinic today for follow up of metastatic colon cancer to the lungs and left adrenal gland. He was last seen by me on 05/19/2024.  Since his last visit, he underwent CT CAP  on 06/25/2024.   Today, he states that he is doing well overall. His appetite level is at 50%. His energy level is at 100%.  PAST MEDICAL HISTORY:   Past Medical History: Past Medical History:  Diagnosis Date   Arthritis    Colon cancer (HCC)    colon   Hepatitis C    Hypertension    Port-A-Cath in place 02/15/2022    Surgical History: Past Surgical History:  Procedure Laterality Date   BIOPSY  03/12/2023   Procedure: BIOPSY;  Surgeon: Cindie Carlin POUR, DO;  Location: [facility];  Service: Endoscopy;;   COLON SURGERY     ESOPHAGEAL BANDING N/A 03/12/2023   Procedure: ESOPHAGEAL BANDING;  Surgeon: Cindie Carlin POUR, DO;  Location: [facility];  Service: Endoscopy;  Laterality: N/A;   ESOPHAGOGASTRODUODENOSCOPY (EGD) WITH PROPOFOL  N/A 03/12/2023   Procedure: ESOPHAGOGASTRODUODENOSCOPY (EGD) WITH PROPOFOL ;  Surgeon: Cindie Carlin POUR, DO;  Location: [facility];  Service: Endoscopy;  Laterality: N/A;  11:30AM; ASA 3   PORTACATH PLACEMENT Right 02/08/2022   Procedure: INSERTION PORT-A-CATH- RIJ;  Surgeon: Evonnie Dorothyann LABOR, DO;  Location: [facility];  Service: General;  Laterality: Right;   REPLACEMENT TOTAL KNEE Left     SHOULDER ARTHROSCOPY Bilateral    TOTAL HIP ARTHROPLASTY Right 11/09/2020   Procedure: TOTAL HIP ARTHROPLASTY ANTERIOR APPROACH;  Surgeon: Beverley Evalene BIRCH, MD;  Location: [facility];  Service: Orthopedics;  Laterality: Right;   WRIST SURGERY Left     Social History: Social History   Socioeconomic History   Marital status: Married    Spouse name: Not on file   Number of children: 7   Years of education: Not on file   Highest education level: Not on file  Occupational History   Occupation: employed  Tobacco Use   Smoking status: Former    Current packs/day: 0.00    Average packs/day: 1.5 packs/day for 20.0 years (30.0 ttl pk-yrs)    Types: Cigarettes    Start date: 11/19/1985    Quit date: 11/19/2005    Years since quitting: 18.6   Smokeless tobacco: Never  Vaping Use   Vaping status: Never Used  Substance and Sexual Activity   Alcohol  use: Not Currently   Drug use: Never   Sexual activity: Yes  Other Topics Concern   Not on file  Social History Narrative   ** Merged History Encounter **       Separated from wife 11/2021   Social Drivers of Health   Financial Resource Strain: Low Risk  (10/07/2019)   Overall Financial Resource Strain (CARDIA)    Difficulty of Paying Living Expenses: Not very hard  Food Insecurity: No Food Insecurity (10/07/2019)   Hunger Vital Sign    Worried About Running Out of Food in the Last Year: Never true    Ran Out of Food in the Last Year: Never true  Transportation Needs: No Transportation Needs (10/07/2019)   PRAPARE - Administrator, Civil Service (Medical): No    Lack of Transportation (Non-Medical): No  Physical Activity: Inactive (10/07/2019)   Exercise Vital Sign    Days of Exercise per Week: 0 days    Minutes of Exercise per Session: 0 min  Stress: No Stress Concern Present (10/07/2019)   [employer] of Occupational Health - Occupational Stress Questionnaire    Feeling of Stress : Not at all  Social  Connections: Moderately Integrated (10/07/2019)   Social Connection and Isolation Panel    Frequency of Communication  with Friends and Family: Once a week    Frequency of Social Gatherings with Friends and Family: Once a week    Attends Religious Services: More than 4 times per year    Active Member of [employer] or Organizations: Yes    Attends Engineer, structural: More than 4 times per year    Marital Status: Married  Catering manager Violence: Not At Risk (10/07/2019)   Humiliation, Afraid, Rape, and Kick questionnaire    Fear of Current or Ex-Partner: No    Emotionally Abused: No    Physically Abused: No    Sexually Abused: No    Family History: Family History  Problem Relation Age of Onset   Heart disease Mother    Dementia Father    Heart disease Sister    Cancer Brother    Hypertension Brother    Hypertension Brother    Healthy Son    Healthy Son    Healthy Son    Healthy Son    Healthy Daughter    Healthy Daughter    Healthy Daughter     Current Medications:  Current Outpatient Medications:    aluminum -magnesium  hydroxide-simethicone  (MAALOX) 200-200-20 MG/5ML SUSP, Take 30 mLs by mouth 4 (four) times daily -  before meals and at bedtime., Disp: 1680 mL, Rfl: 2   Bevacizumab  (AVASTIN  IV), Inject into the vein every 14 (fourteen) days. *start date TBD, Disp: , Rfl:    dexamethasone  (DECADRON ) 0.5 MG/5ML solution, 10ml swish and spit twice daily for 5 days after each chemo treatment, Disp: 240 mL, Rfl: 3   ENULOSE  10 GM/15ML SOLN, Take 10 g by mouth daily as needed., Disp: , Rfl:    fluorouracil  CALGB 19297 2,400 mg/m2 in sodium chloride  0.9 % 150 mL, Inject 2,400 mg/m2 into the vein over 48 hr., Disp: , Rfl:    FLUOROURACIL  IV, Inject into the vein every 14 (fourteen) days., Disp: , Rfl:    Lactulose  20 GM/30ML SOLN, Take 15 mLs (10 g total) by mouth at bedtime. Take 15 ml at bedtime every night to assist with regular bowel movements.  Titrate down if having  multiple bowel movements.  If a bowel movement has not occurred in 3 to 4 days or longer, then take 15 ml every 3 hours until a bowel movent has occurred., Disp: 450 mL, Rfl: 5   LEUCOVORIN  CALCIUM  IV, Inject into the vein every 14 (fourteen) days., Disp: , Rfl:    lidocaine -prilocaine  (EMLA ) cream, Apply 1 Application topically as needed (Apply to port site 30 mins- 1 hour prior to treatment/labs.)., Disp: 30 g, Rfl: 0   lisinopril  (ZESTRIL ) 10 MG tablet, TAKE ONE TABLET BY MOUTH DAILY, Disp: 30 tablet, Rfl: 3   loperamide  (IMODIUM ) 2 MG capsule, TAKE 2 CAPSULES BY MOUTH AFTER FIRST LOOSE STOOL,AND THEN 1 CAPSULE AFTER 2ND LOOSE STOOL. DO NOT EXCEED 8 CAPS IN 24 HOURS, Disp: 30 capsule, Rfl: 0   Misc. Devices MISC, Please provide patient with 1:1 ratio of magic mouthwash and lidocaine  to swish and swallow QID, Disp: 1 each, Rfl: 3   naloxone  (NARCAN ) nasal spray 4 mg/0.1 mL, 0.1ml nasal spray in one nostril. May repeat the dose in other nostril in 2-3 minutes if needed., Disp: 2 each, Rfl: 3   Oxycodone  HCl 10 MG TABS, Take 1 tablet (10 mg total) by mouth every 4 (four) hours as needed., Disp: 180 tablet, Rfl: 0   pantoprazole  (PROTONIX ) 40 MG tablet, Take 1 tablet (40 mg total) by mouth daily.,  Disp: 30 tablet, Rfl: 11   potassium chloride  SA (KLOR-CON  M) 20 MEQ tablet, Take 1 tablet (20 mEq total) by mouth daily., Disp: 90 tablet, Rfl: 4   prochlorperazine  (COMPAZINE ) 10 MG tablet, Take 1 tablet (10 mg total) by mouth every 6 (six) hours as needed (NAUSEA)., Disp: 60 tablet, Rfl: 2   amLODipine  (NORVASC ) 10 MG tablet, Take 1 tablet (10 mg total) by mouth daily., Disp: 90 tablet, Rfl: 3   megestrol  (MEGACE ) 400 MG/10ML suspension, Take 10 mLs (400 mg total) by mouth 2 (two) times daily., Disp: 480 mL, Rfl: 3 No current facility-administered medications for this visit.  Facility-Administered Medications Ordered in Other Visits:    0.9 %  sodium chloride  infusion, , Intravenous, Once, Dameshia Seybold,  Suleyma Wafer, MD   atropine  injection 1 mg, 1 mg, Intravenous, Once PRN, Leman Martinek, MD   dexamethasone  (DECADRON ) injection 10 mg, 10 mg, Intravenous, Once, Meric Joye, MD   fluorouracil  (ADRUCIL ) 3,500 mg in sodium chloride  0.9 % 80 mL chemo infusion, 1,920 mg/m2 (Treatment Plan Recorded), Intravenous, 1 day or 1 dose, Zamzam Whinery, MD   fluorouracil  (ADRUCIL ) chemo injection 600 mg, 320 mg/m2 (Treatment Plan Recorded), Intravenous, Once, [name], MD   fosaprepitant  (EMEND) 150 mg in sodium chloride  0.9 % 145 mL IVPB, 150 mg, Intravenous, Once, [name], MD   irinotecan  (CAMPTOSAR ) 300 mg in sodium chloride  0.9 % 500 mL chemo infusion, 144 mg/m2 (Treatment Plan Recorded), Intravenous, Once, [name], MD   leucovorin  780 mg in sodium chloride  0.9 % 250 mL infusion, 400 mg/m2 (Treatment Plan Recorded), Intravenous, Once, [name], MD   palonosetron  (ALOXI ) injection 0.25 mg, 0.25 mg, Intravenous, Once, [name], MD   potassium chloride  SA (KLOR-CON  M) CR tablet 40 mEq, 40 mEq, Oral, Once, [name], MD   Allergies: No Known Allergies  REVIEW OF SYSTEMS:   Review of Systems  Constitutional:  Negative for chills, fatigue and fever.  HENT:   Negative for lump/mass, mouth sores, nosebleeds, sore throat and trouble swallowing.   Eyes:  Negative for eye problems.  Respiratory:  Negative for cough and shortness of breath.   Cardiovascular:  Positive for chest pain. Negative for leg swelling and palpitations.  Gastrointestinal:  Negative for abdominal pain, constipation, diarrhea, nausea and vomiting.  Genitourinary:  Negative for bladder incontinence, difficulty urinating, dysuria, frequency, hematuria and nocturia.   Musculoskeletal:  Negative for arthralgias, back pain, flank pain, myalgias and neck pain.  Skin:  Negative for itching and rash.  Neurological:  Negative for dizziness, headaches and  numbness.  Hematological:  Does not bruise/bleed easily.  Psychiatric/Behavioral:  Negative for depression, sleep disturbance and suicidal ideas. The patient is not nervous/anxious.   All other systems reviewed and are negative.    VITALS:   Blood pressure (!) 155/87, weight 180 lb 8.9 oz (81.9 kg).  Wt Readings from Last 3 Encounters:  06/30/24 180 lb 8.9 oz (81.9 kg)  06/26/24 174 lb 9.6 oz (79.2 kg)  06/16/24 182 lb (82.6 kg)    Body mass index is 25.91 kg/m.  Performance status (ECOG): 1 - Symptomatic but completely ambulatory  PHYSICAL EXAM:   Physical Exam Vitals and nursing note reviewed. Exam conducted with a chaperone present.  Constitutional:      Appearance: Normal appearance.  Cardiovascular:     Rate and Rhythm: Normal rate and regular rhythm.     Pulses: Normal pulses.     Heart sounds: Normal heart sounds.  Pulmonary:     Effort: Pulmonary  effort is normal.     Breath sounds: Normal breath sounds.  Abdominal:     Palpations: Abdomen is soft. There is no hepatomegaly, splenomegaly or mass.     Tenderness: There is no abdominal tenderness.  Musculoskeletal:     Right lower leg: No edema.     Left lower leg: No edema.  Lymphadenopathy:     Cervical: No cervical adenopathy.     Right cervical: No superficial, deep or posterior cervical adenopathy.    Left cervical: No superficial, deep or posterior cervical adenopathy.     Upper Body:     Right upper body: No supraclavicular or axillary adenopathy.     Left upper body: No supraclavicular or axillary adenopathy.  Neurological:     General: No focal deficit present.     Mental Status: He is alert and oriented to person, place, and time.  Psychiatric:        Mood and Affect: Mood normal.        Behavior: Behavior normal.     LABS:   CBC     Component Value Date/Time   WBC 11.6 (H) 06/30/2024 0902   RBC 3.71 (L) 06/30/2024 0902   HGB 11.1 (L) 06/30/2024 0902   HCT 35.8 (L) 06/30/2024 0902   PLT  152 06/30/2024 0902   MCV 96.5 06/30/2024 0902   MCH 29.9 06/30/2024 0902   MCHC 31.0 06/30/2024 0902   RDW 18.0 (H) 06/30/2024 0902   LYMPHSABS 2.0 06/30/2024 0902   MONOABS 1.2 (H) 06/30/2024 0902   EOSABS 0.2 06/30/2024 0902   BASOSABS 0.1 06/30/2024 0902    CMP      Component Value Date/Time   NA 136 06/30/2024 0902   K 3.7 06/30/2024 0902   CL 103 06/30/2024 0902   CO2 23 06/30/2024 0902   GLUCOSE 132 (H) 06/30/2024 0902   BUN 12 06/30/2024 0902   CREATININE 0.99 06/30/2024 0902   CALCIUM  8.5 (L) 06/30/2024 0902   PROT 6.9 06/30/2024 0902   ALBUMIN 3.3 (L) 06/30/2024 0902   AST 22 06/30/2024 0902   ALT 10 06/30/2024 0902   ALKPHOS 106 06/30/2024 0902   BILITOT 0.5 06/30/2024 0902   GFRNONAA >60 06/30/2024 0902     Lab Results  Component Value Date   CEA1 169.0 (H) 06/16/2024   /  CEA  Date Value Ref Range Status  06/16/2024 169.0 (H) 0.0 - 4.7 ng/mL Final    Comment:    (NOTE)                             Nonsmokers          <3.9                             Smokers             <5.6 [employer] Electrochemiluminescence Immunoassay (ECLIA) Values obtained with different assay methods or kits cannot be used interchangeably.  Results cannot be interpreted as absolute evidence of the presence or absence of malignant disease. Performed At: [facility] [address] [place], [place] [id number] Jennette Shorter MD Ey:[phone]    No results found for: PSA1 No results found for: CAN199 No results found for: CAN125  No results found for: TOTALPROTELP, ALBUMINELP, A1GS, A2GS, BETS, BETA2SER, GAMS, MSPIKE, SPEI Lab Results  Component Value Date   TIBC 406 01/16/2023   TIBC 294  06/06/2022   TIBC 237 (L) 02/22/2022   FERRITIN 150 01/16/2023   FERRITIN 243 06/06/2022   FERRITIN 292 02/22/2022   IRONPCTSAT 18 01/16/2023   IRONPCTSAT 24 06/06/2022   IRONPCTSAT 11 (L) 02/22/2022   Lab Results  Component Value Date   LDH  258 (H) 12/26/2021     STUDIES:   CT CHEST ABDOMEN PELVIS W CONTRAST Result Date: 06/27/2024 CLINICAL DATA:  Cecal cancer, metastatic disease evaluation. * Tracking Code: BO * EXAM: CT CHEST, ABDOMEN, AND PELVIS WITH CONTRAST TECHNIQUE: Multidetector CT imaging of the chest, abdomen and pelvis was performed following the standard protocol during bolus administration of intravenous contrast. RADIATION DOSE REDUCTION: This exam was performed according to the departmental dose-optimization program which includes automated exposure control, adjustment of the mA and/or kV according to patient size and/or use of iterative reconstruction technique. CONTRAST:  OMNIPAQUE  IOHEXOL  300 MG/ML  SOLN COMPARISON:  03/31/2024. FINDINGS: CT CHEST FINDINGS Cardiovascular: Right IJ Port-A-Cath terminates at the brachiocephalic vein junction. Atherosclerotic calcification of the aorta. Heart is enlarged. No pericardial effusion. Mediastinum/Nodes: Thoracic inlet lymph nodes measure up to 13 mm on the right (2/9), similar. Low right paratracheal lymph node measures 1.7 cm, similar. Right hilar lymph node measures 2.3 cm, similar. No left hilar or bilateral axillary adenopathy. Esophagus is grossly unremarkable. Lungs/Pleura: Left lower lobe mass measures 5.4 x 6.9 cm, stable. Numerous parenchymal and subpleural nodular metastases, also unchanged. No pleural fluid. Airway is unremarkable. Musculoskeletal: Degenerative changes in the spine. No worrisome lytic or sclerotic lesions. CT ABDOMEN PELVIS FINDINGS Hepatobiliary: Liver margin is slightly irregular. Liver is normal in size, 15.7 cm. Gallbladder is grossly unremarkable. No biliary ductal dilatation. Pancreas: Negative. Spleen: Enlarged, 14.4 cm. Adrenals/Urinary Tract: Right adrenal gland is unremarkable. Nodular thickening of the left adrenal gland, unchanged. Kidneys are unremarkable. Ureters are decompressed. Bladder is low in volume and largely obscured by streak  artifact from a right hip arthroplasty. Stomach/Bowel: Stomach and small bowel are grossly unremarkable. Right hemicolectomy. Colon is otherwise unremarkable. Vascular/Lymphatic: Gastroesophageal varices. Atherosclerotic calcification of the aorta. No pathologically enlarged lymph nodes. Reproductive: Prostate is visualized but somewhat obscured by streak artifact from a right hip arthroplasty. Other: No free fluid.  Mesenteries and peritoneum are unremarkable. Musculoskeletal: Right hip arthroplasty. Degenerative changes in the spine. No worrisome lytic or sclerotic lesions. IMPRESSION: 1. Stable pulmonary, thoracic nodal and left adrenal metastases. 2. Cirrhosis with portal hypertension. 3.  Aortic atherosclerosis (ICD10-I70.0). Electronically Signed   By: Newell Eke M.D.   On: 06/27/2024 08:46

## 2024-06-30 NOTE — Progress Notes (Signed)
 Patient presents today for chemotherapy infusion. Patient is in satisfactory condition with no new complaints voiced.  Vital signs are stable.  Labs reviewed by Dr. Rogers during the office visit and all labs are within treatment parameters. We will hold Avastin  today per Dr.Katragadda.  We will proceed with treatment per MD orders.

## 2024-06-30 NOTE — Patient Instructions (Addendum)
 [facility] [place] - A DEPT OF [place]  Discharge Instructions: Thank you for choosing [facility] to provide your oncology and hematology care.  If you have a lab appointment with the Cancer Center - please note that after April 8th, 2024, all labs will be drawn in the cancer center.  You do not have to check in or register with the main entrance as you have in the past but will complete your check-in in the cancer center.  Wear comfortable clothing and clothing appropriate for easy access to any Portacath or PICC line.   We strive to give you quality time with your provider. You may need to reschedule your appointment if you arrive late (15 or more minutes).  Arriving late affects you and other patients whose appointments are after yours.  Also, if you miss three or more appointments without notifying the office, you may be dismissed from the clinic at the provider's discretion.      For prescription refill requests, have your pharmacy contact our office and allow 72 hours for refills to be completed.    Today you received the following chemotherapy and/or immunotherapy agents irinotecan , adrucil      To help prevent nausea and vomiting after your treatment, we encourage you to take your nausea medication as directed.  BELOW ARE SYMPTOMS THAT SHOULD BE REPORTED IMMEDIATELY: *FEVER GREATER THAN 100.4 F (38 C) OR HIGHER *CHILLS OR SWEATING *NAUSEA AND VOMITING THAT IS NOT CONTROLLED WITH YOUR NAUSEA MEDICATION *UNUSUAL SHORTNESS OF BREATH *UNUSUAL BRUISING OR BLEEDING *URINARY PROBLEMS (pain or burning when urinating, or frequent urination) *BOWEL PROBLEMS (unusual diarrhea, constipation, pain near the anus) TENDERNESS IN MOUTH AND THROAT WITH OR WITHOUT PRESENCE OF ULCERS (sore throat, sores in mouth, or a toothache) UNUSUAL RASH, SWELLING OR PAIN  UNUSUAL VAGINAL DISCHARGE OR ITCHING   Items with * indicate a potential emergency and should be  followed up as soon as possible or go to the Emergency Department if any problems should occur.  Please show the CHEMOTHERAPY ALERT CARD or IMMUNOTHERAPY ALERT CARD at check-in to the Emergency Department and triage nurse.  Should you have questions after your visit or need to cancel or reschedule your appointment, please contact [facility] [place] - A DEPT OF [name] [phone]  and follow the prompts.  Office hours are 8:00 a.m. to 4:30 p.m. Monday - Friday. Please note that voicemails left after 4:00 p.m. may not be returned until the following business day.  We are closed weekends and major holidays. You have access to a nurse at all times for urgent questions. Please call the main number to the clinic [phone] and follow the prompts.  For any non-urgent questions, you may also contact your provider using MyChart. We now offer e-Visits for anyone 92 and older to request care online for non-urgent symptoms. For details visit [email or web address].   Also download the MyChart app! Go to the app store, search MyChart, open the app, select [place], and log in with your MyChart username and password.

## 2024-06-30 NOTE — Progress Notes (Signed)
 Patient has been examined by Dr. Ellin Saba. Vital signs and labs have been reviewed by MD - ANC, Creatinine, LFTs, hemoglobin, and platelets are within treatment parameters per M.D. - pt may proceed with treatment.  Primary RN and pharmacy notified.

## 2024-07-01 ENCOUNTER — Other Ambulatory Visit: Payer: Self-pay

## 2024-07-01 LAB — CEA: CEA: 176 ng/mL — ABNORMAL HIGH (ref 0.0–4.7)

## 2024-07-02 ENCOUNTER — Inpatient Hospital Stay

## 2024-07-02 VITALS — BP 155/93 | HR 73 | Temp 97.5°F | Resp 18

## 2024-07-02 DIAGNOSIS — Z95828 Presence of other vascular implants and grafts: Secondary | ICD-10-CM

## 2024-07-02 DIAGNOSIS — Z5111 Encounter for antineoplastic chemotherapy: Secondary | ICD-10-CM | POA: Diagnosis not present

## 2024-07-02 DIAGNOSIS — C7802 Secondary malignant neoplasm of left lung: Secondary | ICD-10-CM | POA: Diagnosis not present

## 2024-07-02 DIAGNOSIS — C180 Malignant neoplasm of cecum: Secondary | ICD-10-CM | POA: Diagnosis not present

## 2024-07-02 DIAGNOSIS — C7801 Secondary malignant neoplasm of right lung: Secondary | ICD-10-CM | POA: Diagnosis not present

## 2024-07-02 DIAGNOSIS — Z5189 Encounter for other specified aftercare: Secondary | ICD-10-CM | POA: Diagnosis not present

## 2024-07-02 DIAGNOSIS — C7972 Secondary malignant neoplasm of left adrenal gland: Secondary | ICD-10-CM | POA: Diagnosis not present

## 2024-07-02 DIAGNOSIS — Z5112 Encounter for antineoplastic immunotherapy: Secondary | ICD-10-CM | POA: Diagnosis not present

## 2024-07-02 MED ORDER — HEPARIN SOD (PORK) LOCK FLUSH 100 UNIT/ML IV SOLN
500.0000 [IU] | Freq: Once | INTRAVENOUS | Status: AC | PRN
Start: 2024-07-02 — End: 2024-07-02
  Administered 2024-07-02: 500 [IU]

## 2024-07-02 MED ORDER — PEGFILGRASTIM-CBQV 6 MG/0.6ML ~~LOC~~ SOSY
6.0000 mg | PREFILLED_SYRINGE | Freq: Once | SUBCUTANEOUS | Status: AC
  Administered 2024-07-02: 6 mg via SUBCUTANEOUS
  Filled 2024-07-02: qty 0.6

## 2024-07-02 MED ORDER — SODIUM CHLORIDE 0.9% FLUSH
10.0000 mL | INTRAVENOUS | Status: DC | PRN
  Administered 2024-07-02: 10 mL

## 2024-07-02 NOTE — Patient Instructions (Signed)
 [facility] [place] - A DEPT OF [place]  Discharge Instructions: Thank you for choosing [facility] to provide your oncology and hematology care.  If you have a lab appointment with the Cancer Center - please note that after April 8th, 2024, all labs will be drawn in the cancer center.  You do not have to check in or register with the main entrance as you have in the past but will complete your check-in in the cancer center.  Wear comfortable clothing and clothing appropriate for easy access to any Portacath or PICC line.   We strive to give you quality time with your provider. You may need to reschedule your appointment if you arrive late (15 or more minutes).  Arriving late affects you and other patients whose appointments are after yours.  Also, if you miss three or more appointments without notifying the office, you may be dismissed from the clinic at the provider's discretion.      For prescription refill requests, have your pharmacy contact our office and allow 72 hours for refills to be completed.    Today you received the following pump stop and Udenyca  injection.  Pegfilgrastim  Injection What is this medication? PEGFILGRASTIM  (PEG fil gra stim) lowers the risk of infection in people who are receiving chemotherapy. It works by Systems analyst make more white blood cells, which protects your body from infection. It may also be used to help people who have been exposed to high doses of radiation. This medicine may be used for other purposes; ask your health care provider or pharmacist if you have questions. COMMON BRAND NAME(S): Fulphila , Fylnetra , Neulasta , Nyvepria , Stimufend , UDENYCA , UDENYCA  ONBODY, Ziextenzo  What should I tell my care team before I take this medication? They need to know if you have any of these conditions: Kidney disease Latex allergy Ongoing radiation therapy Sickle cell disease Skin reactions to acrylic adhesives  (On-Body Injector only) An unusual or allergic reaction to pegfilgrastim , filgrastim , other medications, foods, dyes, or preservatives Pregnant or trying to get pregnant Breast-feeding How should I use this medication? This medication is for injection under the skin. If you get this medication at home, you will be taught how to prepare and give the pre-filled syringe or how to use the On-body Injector. Refer to the patient Instructions for Use for detailed instructions. Use exactly as directed. Tell your care team immediately if you suspect that the On-body Injector may not have performed as intended or if you suspect the use of the On-body Injector resulted in a missed or partial dose. It is important that you put your used needles and syringes in a special sharps container. Do not put them in a trash can. If you do not have a sharps container, call your pharmacist or care team to get one. Talk to your care team about the use of this medication in children. While this medication may be prescribed for selected conditions, precautions do apply. Overdosage: If you think you have taken too much of this medicine contact a poison control center or emergency room at once. NOTE: This medicine is only for you. Do not share this medicine with others. What if I miss a dose? It is important not to miss your dose. Call your care team if you miss your dose. If you miss a dose due to an On-body Injector failure or leakage, a new dose should be administered as soon as possible using a single prefilled syringe for  manual use. What may interact with this medication? Interactions have not been studied. This list may not describe all possible interactions. Give your health care provider a list of all the medicines, herbs, non-prescription drugs, or dietary supplements you use. Also tell them if you smoke, drink alcohol , or use illegal drugs. Some items may interact with your medicine. What should I watch for while using  this medication? Your condition will be monitored carefully while you are receiving this medication. You may need blood work done while you are taking this medication. Talk to your care team about your risk of cancer. You may be more at risk for certain types of cancer if you take this medication. If you are going to need a MRI, CT scan, or other procedure, tell your care team that you are using this medication (On-Body Injector only). What side effects may I notice from receiving this medication? Side effects that you should report to your care team as soon as possible: Allergic reactions--skin rash, itching, hives, swelling of the face, lips, tongue, or throat Capillary leak syndrome--stomach or muscle pain, unusual weakness or fatigue, feeling faint or lightheaded, decrease in the amount of urine, swelling of the ankles, hands, or feet, trouble breathing High white blood cell level--fever, fatigue, trouble breathing, night sweats, change in vision, weight loss Inflammation of the aorta--fever, fatigue, back, chest, or stomach pain, severe headache Kidney injury (glomerulonephritis)--decrease in the amount of urine, red or dark brown urine, foamy or bubbly urine, swelling of the ankles, hands, or feet Shortness of breath or trouble breathing Spleen injury--pain in upper left stomach or shoulder Unusual bruising or bleeding Side effects that usually do not require medical attention (report to your care team if they continue or are bothersome): Bone pain Pain in the hands or feet This list may not describe all possible side effects. Call your doctor for medical advice about side effects. You may report side effects to FDA at 1-800-FDA-1088. Where should I keep my medication? Keep out of the reach of children. If you are using this medication at home, you will be instructed on how to store it. Throw away any unused medication after the expiration date on the label. NOTE: This sheet is a summary.  It may not cover all possible information. If you have questions about this medicine, talk to your doctor, pharmacist, or health care provider.  2024 Elsevier/Gold Standard (2021-10-28 00:00:00)  To help prevent nausea and vomiting after your treatment, we encourage you to take your nausea medication as directed.  BELOW ARE SYMPTOMS THAT SHOULD BE REPORTED IMMEDIATELY: *FEVER GREATER THAN 100.4 F (38 C) OR HIGHER *CHILLS OR SWEATING *NAUSEA AND VOMITING THAT IS NOT CONTROLLED WITH YOUR NAUSEA MEDICATION *UNUSUAL SHORTNESS OF BREATH *UNUSUAL BRUISING OR BLEEDING *URINARY PROBLEMS (pain or burning when urinating, or frequent urination) *BOWEL PROBLEMS (unusual diarrhea, constipation, pain near the anus) TENDERNESS IN MOUTH AND THROAT WITH OR WITHOUT PRESENCE OF ULCERS (sore throat, sores in mouth, or a toothache) UNUSUAL RASH, SWELLING OR PAIN  UNUSUAL VAGINAL DISCHARGE OR ITCHING   Items with * indicate a potential emergency and should be followed up as soon as possible or go to the Emergency Department if any problems should occur.  Please show the CHEMOTHERAPY ALERT CARD or IMMUNOTHERAPY ALERT CARD at check-in to the Emergency Department and triage nurse.  Should you have questions after your visit or need to cancel or reschedule your appointment, please contact [facility] [place] - A DEPT OF [place].  [facility] [phone]  and follow the prompts.  Office hours are 8:00 a.m. to 4:30 p.m. Monday - Friday. Please note that voicemails left after 4:00 p.m. may not be returned until the following business day.  We are closed weekends and major holidays. You have access to a nurse at all times for urgent questions. Please call the main number to the clinic [phone] and follow the prompts.  For any non-urgent questions, you may also contact your provider using MyChart. We now offer e-Visits for anyone 67 and older to request care online for non-urgent symptoms. For details  visit [email or web address].   Also download the MyChart app! Go to the app store, search MyChart, open the app, select [place], and log in with your MyChart username and password.

## 2024-07-02 NOTE — Patient Instructions (Signed)
 20    Your procedure is scheduled on: 07/09/2024  Report to [facility] Main Entrance at 11:30    AM.  Call this number if you have problems the morning of surgery: [phone]   Remember:   Follow instructions on letter from office regarding when to stop eating and drinking        No Smoking the day of procedure      Take these medicines the morning of surgery with A SIP OF WATER : Amlodipine , pantoprazole , and oxycodone  if needed   Do not wear jewelry, make-up or nail polish.  Do not wear lotions, powders, or perfumes. You may wear deodorant.                Do not bring valuables to the hospital.  Contacts, dentures or bridgework may not be worn into surgery.  Leave suitcase in the car. After surgery it may be brought to your room.  For patients admitted to the hospital, checkout time is 11:00 AM the day of discharge.   Patients discharged the day of surgery will not be allowed to drive home. Upper Endoscopy, Adult Upper endoscopy is a procedure to look inside the upper GI (gastrointestinal) tract. The upper GI tract is made up of: The part of the body that moves food from your mouth to your stomach (esophagus). The stomach. The first part of your small intestine (duodenum). This procedure is also called esophagogastroduodenoscopy (EGD) or gastroscopy. In this procedure, your health care provider passes a thin, flexible tube (endoscope) through your mouth and down your esophagus into your stomach. A small camera is attached to the end of the tube. Images from the camera appear on a monitor in the exam room. During this procedure, your health care provider may also remove a small piece of tissue to be sent to a lab and examined under a microscope (biopsy). Your health care provider may do an upper endoscopy to diagnose cancers of the upper GI tract. You may also have this procedure to find the cause of other conditions, such as: Stomach pain. Heartburn. Pain or problems when  swallowing. Nausea and vomiting. Stomach bleeding. Stomach ulcers. Tell a health care provider about: Any allergies you have. All medicines you are taking, including vitamins, herbs, eye drops, creams, and over-the-counter medicines. Any problems you or family members have had with anesthetic medicines. Any blood disorders you have. Any surgeries you have had. Any medical conditions you have. Whether you are pregnant or may be pregnant. What are the risks? Generally, this is a safe procedure. However, problems may occur, including: Infection. Bleeding. Allergic reactions to medicines. A tear or hole (perforation) in the esophagus, stomach, or duodenum. What happens before the procedure? Staying hydrated Follow instructions from your health care provider about hydration, which may include: Up to 4 hours before the procedure - you may continue to drink clear liquids, such as water , clear fruit juice, black coffee, and plain tea.   Medicines Ask your health care provider about: Changing or stopping your regular medicines. This is especially important if you are taking diabetes medicines or blood thinners. Taking medicines such as aspirin  and ibuprofen . These medicines can thin your blood. Do not take these medicines unless your health care provider tells you to take them. Taking over-the-counter medicines, vitamins, herbs, and supplements. General instructions Plan to have someone take you home from the hospital or clinic. If you will be going home right after the procedure, plan to have someone with you for 24  hours. Ask your health care provider what steps will be taken to help prevent infection. What happens during the procedure?  An IV will be inserted into one of your veins. You may be given one or more of the following: A medicine to help you relax (sedative). A medicine to numb the throat (local anesthetic). You will lie on your left side on an exam table. Your health care  provider will pass the endoscope through your mouth and down your esophagus. Your health care provider will use the scope to check the inside of your esophagus, stomach, and duodenum. Biopsies may be taken. The endoscope will be removed. The procedure may vary among health care providers and hospitals. What happens after the procedure? Your blood pressure, heart rate, breathing rate, and blood oxygen level will be monitored until you leave the hospital or clinic. Do not drive for 24 hours if you were given a sedative during your procedure. When your throat is no longer numb, you may be given some fluids to drink. It is up to you to get the results of your procedure. Ask your health care provider, or the department that is doing the procedure, when your results will be ready. Summary Upper endoscopy is a procedure to look inside the upper GI tract. During the procedure, an IV will be inserted into one of your veins. You may be given a medicine to help you relax. A medicine will be used to numb your throat. The endoscope will be passed through your mouth and down your esophagus. This information is not intended to replace advice given to you by your health care provider. Make sure you discuss any questions you have with your health care provider. Document Revised: 05/22/2018 Document Reviewed: 04/29/2018 Elsevier Patient Education  2020 Elsevier Inc.                                                                                                                                      EndoscopyCare After  Please read the instructions outlined below and refer to this sheet in the next few weeks. These discharge instructions provide you with general information on caring for yourself after you leave the hospital. Your doctor may also give you specific instructions. While your treatment has been planned according to the most current medical practices available, unavoidable complications occasionally  occur. If you have any problems or questions after discharge, please call your doctor. HOME CARE INSTRUCTIONS Activity You may resume your regular activity but move at a slower pace for the next 24 hours.  Take frequent rest periods for the next 24 hours.  Walking will help expel (get rid of) the air and reduce the bloated feeling in your abdomen.  No driving for 24 hours (because of the anesthesia (medicine) used during the test).  You may shower.  Do not sign any important legal documents or operate any machinery for 24 hours (because of  the anesthesia used during the test).  Nutrition Drink plenty of fluids.  You may resume your normal diet.  Begin with a light meal and progress to your normal diet.  Avoid alcoholic beverages for 24 hours or as instructed by your caregiver.  Medications You may resume your normal medications unless your caregiver tells you otherwise. What you can expect today You may experience abdominal discomfort such as a feeling of fullness or gas pains.  You may experience a sore throat for 2 to 3 days. This is normal. Gargling with salt water  may help this.  Follow-up Your doctor will discuss the results of your test with you. SEEK IMMEDIATE MEDICAL CARE IF: You have excessive nausea (feeling sick to your stomach) and/or vomiting.  You have severe abdominal pain and distention (swelling).  You have trouble swallowing.  You have a temperature over 100 F (37.8 C).  You have rectal bleeding or vomiting of blood.  Document Released: 07/11/2004 Document Revised: 11/16/2011 Document Reviewed: 01/22/2008

## 2024-07-02 NOTE — Progress Notes (Signed)
 Patient presents for pump-stop and Udenyca  injection. Pump removed without issues. Patients port flushed without difficulty.  Good blood return noted with no bruising or swelling noted at site.  Band aid applied.  Patient tolerated injection in right arm with no complaints voiced.  Site clean and dry with no bruising or swelling noted.  No complaints of pain.  Discharged with vital signs stable and no signs or symptoms of distress noted.

## 2024-07-03 ENCOUNTER — Encounter (HOSPITAL_COMMUNITY)
Admission: RE | Admit: 2024-07-03 | Discharge: 2024-07-03 | Disposition: A | Discharge status: Home / Self Care | Source: Ambulatory Visit | Attending: Internal Medicine | Admitting: Internal Medicine

## 2024-07-03 DIAGNOSIS — K769 Liver disease, unspecified: Secondary | ICD-10-CM

## 2024-07-03 DIAGNOSIS — I10 Essential (primary) hypertension: Secondary | ICD-10-CM

## 2024-07-09 ENCOUNTER — Encounter (HOSPITAL_COMMUNITY)
Admission: RE | Disposition: A | Discharge status: Home / Self Care | Payer: Self-pay | Source: Home / Self Care | Attending: Internal Medicine

## 2024-07-09 ENCOUNTER — Ambulatory Visit (HOSPITAL_BASED_OUTPATIENT_CLINIC_OR_DEPARTMENT_OTHER): Admitting: Anesthesiology

## 2024-07-09 ENCOUNTER — Ambulatory Visit (HOSPITAL_COMMUNITY)
Admission: RE | Admit: 2024-07-09 | Discharge: 2024-07-09 | Disposition: A | Discharge status: Home / Self Care | Attending: Internal Medicine | Admitting: Internal Medicine

## 2024-07-09 ENCOUNTER — Encounter (HOSPITAL_COMMUNITY): Admitting: Anesthesiology

## 2024-07-09 ENCOUNTER — Other Ambulatory Visit: Payer: Self-pay

## 2024-07-09 ENCOUNTER — Encounter (HOSPITAL_COMMUNITY): Payer: Self-pay | Admitting: Internal Medicine

## 2024-07-09 DIAGNOSIS — K2100 Gastro-esophageal reflux disease with esophagitis, without bleeding: Secondary | ICD-10-CM | POA: Insufficient documentation

## 2024-07-09 DIAGNOSIS — K766 Portal hypertension: Secondary | ICD-10-CM | POA: Diagnosis not present

## 2024-07-09 DIAGNOSIS — Z87891 Personal history of nicotine dependence: Secondary | ICD-10-CM

## 2024-07-09 DIAGNOSIS — C7800 Secondary malignant neoplasm of unspecified lung: Secondary | ICD-10-CM | POA: Diagnosis not present

## 2024-07-09 DIAGNOSIS — Z9049 Acquired absence of other specified parts of digestive tract: Secondary | ICD-10-CM | POA: Diagnosis not present

## 2024-07-09 DIAGNOSIS — C180 Malignant neoplasm of cecum: Secondary | ICD-10-CM | POA: Insufficient documentation

## 2024-07-09 DIAGNOSIS — I10 Essential (primary) hypertension: Secondary | ICD-10-CM

## 2024-07-09 DIAGNOSIS — Z8619 Personal history of other infectious and parasitic diseases: Secondary | ICD-10-CM | POA: Insufficient documentation

## 2024-07-09 DIAGNOSIS — C7972 Secondary malignant neoplasm of left adrenal gland: Secondary | ICD-10-CM | POA: Diagnosis not present

## 2024-07-09 DIAGNOSIS — K2270 Barrett's esophagus without dysplasia: Secondary | ICD-10-CM

## 2024-07-09 HISTORY — PX: ESOPHAGOGASTRODUODENOSCOPY: SHX5428

## 2024-07-09 SURGERY — EGD (ESOPHAGOGASTRODUODENOSCOPY)
Anesthesia: General

## 2024-07-09 MED ORDER — LACTATED RINGERS IV SOLN: INTRAVENOUS | Status: DC

## 2024-07-09 MED ORDER — LIDOCAINE 2% (20 MG/ML) 5 ML SYRINGE
INTRAMUSCULAR | Status: DC | PRN
Start: 2024-07-09 — End: 2024-07-09
  Administered 2024-07-09: 100 mg via INTRAVENOUS

## 2024-07-09 MED ORDER — PROPOFOL 500 MG/50ML IV EMUL
INTRAVENOUS | Status: DC | PRN
  Administered 2024-07-09: 200 ug/kg/min via INTRAVENOUS

## 2024-07-09 MED ORDER — PROPOFOL 10 MG/ML IV BOLUS
INTRAVENOUS | Status: DC | PRN
  Administered 2024-07-09: 100 mg via INTRAVENOUS

## 2024-07-09 NOTE — Op Note (Signed)
 [facility] Patient Name: Evan [name] Procedure Date: 07/09/2024 12:19 PM MRN: [id number] Date of Birth: 30-Nov-1956 Attending MD: Carlin POUR. Cindie , [place], [id number] CSN: [id number] Age: 68 Admit Type: Outpatient Procedure:                Upper GI endoscopy Indications:              Portal hypertension rule out esophageal varices Providers:                Carlin POUR. Cindie, DO, Crystal Page, Daphne Mulch                            Technician, Technician Referring MD:              Medicines:                See the Anesthesia note for documentation of the                            administered medications Complications:            No immediate complications. Estimated Blood Loss:     Estimated blood loss: none. Procedure:                Pre-Anesthesia Assessment:                           - The anesthesia plan was to use monitored                            anesthesia care (MAC).                           After obtaining informed consent, the endoscope was                            passed under direct vision. Throughout the                            procedure, the patient's blood pressure, pulse, and                            oxygen saturations were monitored continuously. The                            [id number]-H190 ([id number]) scope was introduced through the                            mouth, and advanced to the second part of duodenum.                            The upper GI endoscopy was accomplished without                            difficulty. The patient tolerated the procedure                            well. Scope  In: 12:36:59 PM Scope Out: 12:41:28 PM Total Procedure Duration: 0 hours 4 minutes 29 seconds  Findings:      There is no endoscopic evidence of varices in the entire esophagus.      Mild reflux esophagitis with no bleeding was found in the lower third of       the esophagus. Previously biopsied.      The entire examined stomach was normal.      The  duodenal bulb, first portion of the duodenum and second portion of       the duodenum were normal. Impression:               - Reflux esophagitis with no bleeding.                           - Normal stomach.                           - Normal duodenal bulb, first portion of the                            duodenum and second portion of the duodenum.                           - No specimens collected. Moderate Sedation:      Per Anesthesia Care Recommendation:           - Patient has a contact number available for                            emergencies. The signs and symptoms of potential                            delayed complications were discussed with the                            patient. Return to normal activities tomorrow.                            Written discharge instructions were provided to the                            patient.                           - Resume previous diet.                           - Continue present medications.                           - Repeat upper endoscopy in 1 year for screening                            purposes.                           - Return to GI clinic PRN.                           -  Use Protonix  (pantoprazole ) 40 mg PO daily. Procedure Code(s):        --- Professional ---                           [phone], Esophagogastroduodenoscopy, flexible,                            transoral; diagnostic, including collection of                            specimen(s) by brushing or washing, when performed                            (separate procedure) Diagnosis Code(s):        --- Professional ---                           K21.00, Gastro-esophageal reflux disease with                            esophagitis, without bleeding                           K76.6, Portal hypertension CPT copyright 2022 American Medical Association. All rights reserved. The codes documented in this report are preliminary and upon coder review may  be revised to meet  current compliance requirements. Carlin POUR. Cindie, DO Carlin POUR. Cindie, DO 07/09/2024 12:48:46 PM This report has been signed electronically. Number of Addenda: 0

## 2024-07-09 NOTE — Interval H&P Note (Signed)
 History and Physical Interval Note:  07/09/2024 12:22 PM  Drako L [place]  has presented today for surgery, with the diagnosis of reevaluate for variceal disease.  The various methods of treatment have been discussed with the patient and family. After consideration of risks, benefits and other options for treatment, the patient has consented to  Procedure(s) with comments: EGD (ESOPHAGOGASTRODUODENOSCOPY) (N/A) - 130pm, asa 3 as a surgical intervention.  The patient's history has been reviewed, patient examined, no change in status, stable for surgery.  I have reviewed the patient's chart and labs.  Questions were answered to the patient's satisfaction.     Carlin MARLA Hasty

## 2024-07-09 NOTE — Transfer of Care (Signed)
 Immediate Anesthesia Transfer of Care Note  Patient: Evan [name]  Procedure(s) Performed: EGD (ESOPHAGOGASTRODUODENOSCOPY)  Patient Location: Short Stay  Anesthesia Type:General  Level of Consciousness: awake, alert , oriented, and patient cooperative  Airway & Oxygen Therapy: Patient Spontanous Breathing  Post-op Assessment: Report given to RN, Post -op Vital signs reviewed and stable, and Patient moving all extremities X 4  Post vital signs: Reviewed and stable  Last Vitals:  Vitals Value Taken Time  BP    Temp    Pulse    Resp    SpO2      Last Pain:  Vitals:   07/09/24 1234  TempSrc:   PainSc: 0-No pain         Complications: No notable events documented.

## 2024-07-09 NOTE — Anesthesia Preprocedure Evaluation (Signed)
 Anesthesia Evaluation  Patient identified by MRN, date of birth, ID band Patient awake    Reviewed: Allergy & Precautions, H&P , NPO status , Patient's Chart, lab work & pertinent test results, reviewed documented beta blocker date and time   Airway Mallampati: II  TM Distance: >3 FB Neck ROM: full    Dental no notable dental hx.    Pulmonary neg pulmonary ROS, former smoker   Pulmonary exam normal breath sounds clear to auscultation       Cardiovascular Exercise Tolerance: Good hypertension,  Rhythm:regular Rate:Normal     Neuro/Psych negative neurological ROS  negative psych ROS   GI/Hepatic negative GI ROS,,,(+) Hepatitis -  Endo/Other  negative endocrine ROS    Renal/GU negative Renal ROS  negative genitourinary   Musculoskeletal   Abdominal   Peds  Hematology negative hematology ROS (+)   Anesthesia Other Findings   Reproductive/Obstetrics negative OB ROS                              Anesthesia Physical Anesthesia Plan  ASA: 3  Anesthesia Plan: General   Post-op Pain Management:    Induction:   PONV Risk Score and Plan: Propofol  infusion  Airway Management Planned:   Additional Equipment:   Intra-op Plan:   Post-operative Plan:   Informed Consent: I have reviewed the patients History and Physical, chart, labs and discussed the procedure including the risks, benefits and alternatives for the proposed anesthesia with the patient or authorized representative who has indicated his/her understanding and acceptance.     Dental Advisory Given  Plan Discussed with: CRNA  Anesthesia Plan Comments:         Anesthesia Quick Evaluation

## 2024-07-09 NOTE — Discharge Instructions (Addendum)
 EGD Discharge instructions Please read the instructions outlined below and refer to this sheet in the next few weeks. These discharge instructions provide you with general information on caring for yourself after you leave the hospital. Your doctor may also give you specific instructions. While your treatment has been planned according to the most current medical practices available, unavoidable complications occasionally occur. If you have any problems or questions after discharge, please call your doctor. ACTIVITY You may resume your regular activity but move at a slower pace for the next 24 hours.  Take frequent rest periods for the next 24 hours.  Walking will help expel (get rid of) the air and reduce the bloated feeling in your abdomen.  No driving for 24 hours (because of the anesthesia (medicine) used during the test).  You may shower.  Do not sign any important legal documents or operate any machinery for 24 hours (because of the anesthesia used during the test).  NUTRITION Drink plenty of fluids.  You may resume your normal diet.  Begin with a light meal and progress to your normal diet.  Avoid alcoholic beverages for 24 hours or as instructed by your caregiver.  MEDICATIONS You may resume your normal medications unless your caregiver tells you otherwise.  WHAT YOU CAN EXPECT TODAY You may experience abdominal discomfort such as a feeling of fullness or "gas" pains.  FOLLOW-UP Your doctor will discuss the results of your test with you.  SEEK IMMEDIATE MEDICAL ATTENTION IF ANY OF THE FOLLOWING OCCUR: Excessive nausea (feeling sick to your stomach) and/or vomiting.  Severe abdominal pain and distention (swelling).  Trouble swallowing.  Temperature over 101 F (37.8 C).  Rectal bleeding or vomiting of blood.   I did not see any evidence of esophageal varices on your exam today.  You have mild reflux esophagitis.  Continue on pantoprazole  daily.  Recommend repeat upper endoscopy in  1 year.  Follow-up with GI as needed.   I hope you have a great rest of your week!  Carlin POUR. Cindie, D.O. Gastroenterology and Hepatology [facility]

## 2024-07-10 ENCOUNTER — Encounter (HOSPITAL_COMMUNITY): Payer: Self-pay | Admitting: Internal Medicine

## 2024-07-11 NOTE — Anesthesia Postprocedure Evaluation (Signed)
 Anesthesia Post Note  Patient: Evan [name]  Procedure(s) Performed: EGD (ESOPHAGOGASTRODUODENOSCOPY)  Patient location during evaluation: Phase II Anesthesia Type: General Level of consciousness: awake Pain management: pain level controlled Vital Signs Assessment: post-procedure vital signs reviewed and stable Respiratory status: spontaneous breathing and respiratory function stable Cardiovascular status: blood pressure returned to baseline and stable Postop Assessment: no headache and no apparent nausea or vomiting Anesthetic complications: no Comments: Late entry   No notable events documented.   Last Vitals:  Vitals:   07/09/24 1143 07/09/24 1245  BP: (!) 140/79 (!) 160/77  Pulse: (!) 59 88  Resp: 11 18  Temp: 36.9 C (!) 36.4 C  SpO2: 100% 100%    Last Pain:  Vitals:   07/10/24 1507  TempSrc:   PainSc: 0-No pain                 Yvonna JINNY Bosworth

## 2024-07-14 ENCOUNTER — Inpatient Hospital Stay: Attending: Oncology

## 2024-07-14 ENCOUNTER — Inpatient Hospital Stay: Admitting: Hematology

## 2024-07-14 ENCOUNTER — Inpatient Hospital Stay

## 2024-07-14 ENCOUNTER — Encounter: Payer: Self-pay | Admitting: Hematology

## 2024-07-14 VITALS — BP 128/82 | HR 63 | Temp 98.4°F | Resp 18

## 2024-07-14 VITALS — BP 141/85 | HR 79 | Temp 96.5°F | Resp 18 | Wt 186.7 lb

## 2024-07-14 DIAGNOSIS — C180 Malignant neoplasm of cecum: Secondary | ICD-10-CM | POA: Diagnosis not present

## 2024-07-14 DIAGNOSIS — Z95828 Presence of other vascular implants and grafts: Secondary | ICD-10-CM

## 2024-07-14 DIAGNOSIS — Z5189 Encounter for other specified aftercare: Secondary | ICD-10-CM | POA: Diagnosis not present

## 2024-07-14 DIAGNOSIS — Z5111 Encounter for antineoplastic chemotherapy: Secondary | ICD-10-CM | POA: Insufficient documentation

## 2024-07-14 DIAGNOSIS — C189 Malignant neoplasm of colon, unspecified: Secondary | ICD-10-CM | POA: Diagnosis not present

## 2024-07-14 DIAGNOSIS — Z5112 Encounter for antineoplastic immunotherapy: Secondary | ICD-10-CM | POA: Diagnosis not present

## 2024-07-14 LAB — URINALYSIS, DIPSTICK ONLY
Bilirubin Urine: NEGATIVE
Glucose, UA: NEGATIVE mg/dL
Ketones, ur: NEGATIVE mg/dL
Leukocytes,Ua: NEGATIVE
Nitrite: NEGATIVE
Protein, ur: 30 mg/dL — AB
Specific Gravity, Urine: 1.014 (ref 1.005–1.030)
pH: 6 (ref 5.0–8.0)

## 2024-07-14 LAB — COMPREHENSIVE METABOLIC PANEL WITH GFR
ALT: 10 U/L (ref 0–44)
AST: 20 U/L (ref 15–41)
Albumin: 3.2 g/dL — ABNORMAL LOW (ref 3.5–5.0)
Alkaline Phosphatase: 99 U/L (ref 38–126)
Anion gap: 9 (ref 5–15)
BUN: 9 mg/dL (ref 8–23)
CO2: 23 mmol/L (ref 22–32)
Calcium: 8.3 mg/dL — ABNORMAL LOW (ref 8.9–10.3)
Chloride: 107 mmol/L (ref 98–111)
Creatinine, Ser: 0.96 mg/dL (ref 0.61–1.24)
GFR, Estimated: >60 mL/min (ref >60)
Glucose, Bld: 122 mg/dL — ABNORMAL HIGH (ref 70–99)
Potassium: 3.5 mmol/L (ref 3.5–5.1)
Sodium: 139 mmol/L (ref 135–145)
Total Bilirubin: 0.6 mg/dL (ref 0.0–1.2)
Total Protein: 6.5 g/dL (ref 6.5–8.1)

## 2024-07-14 LAB — CBC WITH DIFFERENTIAL/PLATELET
Abs Immature Granulocytes: 0.3 K/uL — ABNORMAL HIGH (ref 0.00–0.07)
Band Neutrophils: 1 %
Basophils Absolute: 0.1 K/uL (ref 0.0–0.1)
Basophils Relative: 1 %
Eosinophils Absolute: 0.3 K/uL (ref 0.0–0.5)
Eosinophils Relative: 3 %
HCT: 33 % — ABNORMAL LOW (ref 39.0–52.0)
Hemoglobin: 10.5 g/dL — ABNORMAL LOW (ref 13.0–17.0)
Lymphocytes Relative: 30 %
Lymphs Abs: 2.8 K/uL (ref 0.7–4.0)
MCH: 30.8 pg (ref 26.0–34.0)
MCHC: 31.8 g/dL (ref 30.0–36.0)
MCV: 96.8 fL (ref 80.0–100.0)
Metamyelocytes Relative: 2 %
Monocytes Absolute: 0.5 K/uL (ref 0.1–1.0)
Monocytes Relative: 5 %
Myelocytes: 1 %
Neutro Abs: 5.5 K/uL (ref 1.7–7.7)
Neutrophils Relative %: 57 %
Platelets: 139 K/uL — ABNORMAL LOW (ref 150–400)
RBC: 3.41 MIL/uL — ABNORMAL LOW (ref 4.22–5.81)
RDW: 17.9 % — ABNORMAL HIGH (ref 11.5–15.5)
Smear Review: NORMAL
WBC: 9.4 K/uL (ref 4.0–10.5)
nRBC: 0 % (ref 0.0–0.2)

## 2024-07-14 LAB — MAGNESIUM: Magnesium: 1.8 mg/dL (ref 1.7–2.4)

## 2024-07-14 MED ORDER — SODIUM CHLORIDE 0.9 % IV SOLN
Freq: Once | INTRAVENOUS | Status: AC
Start: 2024-07-14 — End: 2024-07-14

## 2024-07-14 MED ORDER — SODIUM CHLORIDE 0.9 % IV SOLN
144.0000 mg/m2 | Freq: Once | INTRAVENOUS | Status: AC
  Administered 2024-07-14: 300 mg via INTRAVENOUS
  Filled 2024-07-14: qty 15

## 2024-07-14 MED ORDER — SODIUM CHLORIDE 0.9% FLUSH
10.0000 mL | Freq: Once | INTRAVENOUS | Status: AC
  Administered 2024-07-14: 10 mL

## 2024-07-14 MED ORDER — FLUOROURACIL CHEMO INJECTION 2.5 GM/50ML
320.0000 mg/m2 | Freq: Once | INTRAVENOUS | Status: AC
  Administered 2024-07-14: 600 mg via INTRAVENOUS
  Filled 2024-07-14: qty 12

## 2024-07-14 MED ORDER — SODIUM CHLORIDE 0.9 % IV SOLN
400.0000 mg/m2 | Freq: Once | INTRAVENOUS | Status: AC
  Administered 2024-07-14: 780 mg via INTRAVENOUS
  Filled 2024-07-14: qty 39

## 2024-07-14 MED ORDER — SODIUM CHLORIDE 0.9 % IV SOLN
5.0000 mg/kg | Freq: Once | INTRAVENOUS | Status: AC
  Administered 2024-07-14: 400 mg via INTRAVENOUS
  Filled 2024-07-14: qty 16

## 2024-07-14 MED ORDER — DEXAMETHASONE SODIUM PHOSPHATE 10 MG/ML IJ SOLN
10.0000 mg | Freq: Once | INTRAMUSCULAR | Status: AC
  Administered 2024-07-14: 10 mg via INTRAVENOUS
  Filled 2024-07-14: qty 1

## 2024-07-14 MED ORDER — SODIUM CHLORIDE 0.9 % IV SOLN
150.0000 mg | Freq: Once | INTRAVENOUS | Status: AC
  Administered 2024-07-14: 150 mg via INTRAVENOUS
  Filled 2024-07-14: qty 150

## 2024-07-14 MED ORDER — ATROPINE SULFATE 1 MG/ML IV SOLN
1.0000 mg | Freq: Once | INTRAVENOUS | Status: AC
  Administered 2024-07-14: 1 mg via INTRAVENOUS
  Filled 2024-07-14: qty 1

## 2024-07-14 MED ORDER — PALONOSETRON HCL INJECTION 0.25 MG/5ML
0.2500 mg | Freq: Once | INTRAVENOUS | Status: AC
  Administered 2024-07-14: 0.25 mg via INTRAVENOUS
  Filled 2024-07-14: qty 5

## 2024-07-14 MED ORDER — SODIUM CHLORIDE 0.9 % IV SOLN
1920.0000 mg/m2 | INTRAVENOUS | Status: DC
  Administered 2024-07-14: 3500 mg via INTRAVENOUS
  Filled 2024-07-14: qty 70

## 2024-07-14 NOTE — Patient Instructions (Addendum)
 [facility] [place] - A DEPT OF [place]. [facility]  Discharge Instructions: Thank you for choosing [facility] to provide your oncology and hematology care.  If you have a lab appointment with the Cancer Center - please note that after April 8th, 2024, all labs will be drawn in the cancer center.  You do not have to check in or register with the main entrance as you have in the past but will complete your check-in in the cancer center.  Wear comfortable clothing and clothing appropriate for easy access to any Portacath or PICC line.   We strive to give you quality time with your provider. You may need to reschedule your appointment if you arrive late (15 or more minutes).  Arriving late affects you and other patients whose appointments are after yours.  Also, if you miss three or more appointments without notifying the office, you may be dismissed from the clinic at the provider's discretion.      For prescription refill requests, have your pharmacy contact our office and allow 72 hours for refills to be completed.    Today you received the following chemotherapy and/or immunotherapy agents MVASI , Irinotecan , Leucovorin , and Adrucil .   Irinotecan  Injection What is this medication? IRINOTECAN  (ir in oh TEE kan) treats some types of cancer. It works by slowing down the growth of cancer cells. This medicine may be used for other purposes; ask your health care provider or pharmacist if you have questions. COMMON BRAND NAME(S): Camptosar  What should I tell my care team before I take this medication? They need to know if you have any of these conditions: Dehydration Diarrhea Infection, especially a viral infection, such as chickenpox, cold sores, herpes Liver disease Low blood cell levels (white cells, red cells, and platelets) Low levels of electrolytes, such as calcium , magnesium , or potassium in your blood Recent or ongoing radiation An unusual or allergic  reaction to irinotecan , other medications, foods, dyes, or preservatives If you or your partner are pregnant or trying to get pregnant Breast-feeding How should I use this medication? This medication is injected into a vein. It is given by your care team in a hospital or clinic setting. Talk to your care team about the use of this medication in children. Special care may be needed. Overdosage: If you think you have taken too much of this medicine contact a poison control center or emergency room at once. NOTE: This medicine is only for you. Do not share this medicine with others. What if I miss a dose? Keep appointments for follow-up doses. It is important not to miss your dose. Call your care team if you are unable to keep an appointment. What may interact with this medication? Do not take this medication with any of the following: Cobicistat Itraconazole This medication may also interact with the following: Certain antibiotics, such as clarithromycin, rifampin, rifabutin Certain antivirals for HIV or AIDS Certain medications for fungal infections, such as ketoconazole, posaconazole, voriconazole Certain medications for seizures, such as carbamazepine, phenobarbital, phenytoin Gemfibrozil Nefazodone St. John's wort This list may not describe all possible interactions. Give your health care provider a list of all the medicines, herbs, non-prescription drugs, or dietary supplements you use. Also tell them if you smoke, drink alcohol , or use illegal drugs. Some items may interact with your medicine. What should I watch for while using this medication? Your condition will be monitored carefully while you are receiving this medication. You may need blood work  while taking this medication. This medication may make you feel generally unwell. This is not uncommon as chemotherapy can affect healthy cells as well as cancer cells. Report any side effects. Continue your course of treatment even though  you feel ill unless your care team tells you to stop. This medication can cause serious side effects. To reduce the risk, your care team may give you other medications to take before receiving this one. Be sure to follow the directions from your care team. This medication may affect your coordination, reaction time, or judgement. Do not drive or operate machinery until you know how this medication affects you. Sit up or stand slowly to reduce the risk of dizzy or fainting spells. Drinking alcohol  with this medication can increase the risk of these side effects. This medication may increase your risk of getting an infection. Call your care team for advice if you get a fever, chills, sore throat, or other symptoms of a cold or flu. Do not treat yourself. Try to avoid being around people who are sick. Avoid taking medications that contain aspirin , acetaminophen , ibuprofen , naproxen, or ketoprofen unless instructed by your care team. These medications may hide a fever. This medication may increase your risk to bruise or bleed. Call your care team if you notice any unusual bleeding. Be careful brushing or flossing your teeth or using a toothpick because you may get an infection or bleed more easily. If you have any dental work done, tell your dentist you are receiving this medication. Talk to your care team if you or your partner are pregnant or think either of you might be pregnant. This medication can cause serious birth defects if taken during pregnancy and for 6 months after the last dose. You will need a negative pregnancy test before starting this medication. Contraception is recommended while taking this medication and for 6 months after the last dose. Your care team can help you find the option that works for you. Do not father a child while taking this medication and for 3 months after the last dose. Use a condom for contraception during this time period. Do not breastfeed while taking this medication  and for 7 days after the last dose. This medication may cause infertility. Talk to your care team if you are concerned about your fertility. What side effects may I notice from receiving this medication? Side effects that you should report to your care team as soon as possible: Allergic reactions--skin rash, itching, hives, swelling of the face, lips, tongue, or throat Dry cough, shortness of breath or trouble breathing Increased saliva or tears, increased sweating, stomach cramping, diarrhea, small pupils, unusual weakness or fatigue, slow heartbeat Infection--fever, chills, cough, sore throat, wounds that don't heal, pain or trouble when passing urine, general feeling of discomfort or being unwell Kidney injury--decrease in the amount of urine, swelling of the ankles, hands, or feet Low red blood cell level--unusual weakness or fatigue, dizziness, headache, trouble breathing Severe or prolonged diarrhea Unusual bruising or bleeding Side effects that usually do not require medical attention (report to your care team if they continue or are bothersome): Constipation Diarrhea Hair loss Loss of appetite Nausea Stomach pain This list may not describe all possible side effects. Call your doctor for medical advice about side effects. You may report side effects to FDA at 1-800-FDA-1088. Where should I keep my medication? This medication is given in a hospital or clinic. It will not be stored at home. NOTE: This sheet is a summary.  It may not cover all possible information. If you have questions about this medicine, talk to your doctor, pharmacist, or health care provider.  2024 Elsevier/Gold Standard (2022-04-10 00:00:00)  Leucovorin  Injection What is this medication? LEUCOVORIN  (loo koe VOR in) prevents side effects from certain medications, such as methotrexate. It works by increasing folate levels. This helps protect healthy cells in your body. It may also be used to treat anemia caused by  low levels of folate. It can also be used with fluorouracil , a type of chemotherapy, to treat colorectal cancer. It works by increasing the effects of fluorouracil  in the body. This medicine may be used for other purposes; ask your health care provider or pharmacist if you have questions. What should I tell my care team before I take this medication? They need to know if you have any of these conditions: Anemia from low levels of vitamin B12 in the blood An unusual or allergic reaction to leucovorin , folic acid , other medications, foods, dyes, or preservatives Pregnant or trying to get pregnant Breastfeeding How should I use this medication? This medication is injected into a vein or a muscle. It is given by your care team in a hospital or clinic setting. Talk to your care team about the use of this medication in children. Special care may be needed. Overdosage: If you think you have taken too much of this medicine contact a poison control center or emergency room at once. NOTE: This medicine is only for you. Do not share this medicine with others. What if I miss a dose? Keep appointments for follow-up doses. It is important not to miss your dose. Call your care team if you are unable to keep an appointment. What may interact with this medication? Capecitabine Fluorouracil  Phenobarbital Phenytoin Primidone Trimethoprim;sulfamethoxazole This list may not describe all possible interactions. Give your health care provider a list of all the medicines, herbs, non-prescription drugs, or dietary supplements you use. Also tell them if you smoke, drink alcohol , or use illegal drugs. Some items may interact with your medicine. What should I watch for while using this medication? Your condition will be monitored carefully while you are receiving this medication. This medication may increase the side effects of 5-fluorouracil . Tell your care team if you have diarrhea or mouth sores that do not get  better or that get worse. What side effects may I notice from receiving this medication? Side effects that you should report to your care team as soon as possible: Allergic reactions--skin rash, itching, hives, swelling of the face, lips, tongue, or throat This list may not describe all possible side effects. Call your doctor for medical advice about side effects. You may report side effects to FDA at 1-800-FDA-1088. Where should I keep my medication? This medication is given in a hospital or clinic. It will not be stored at home. NOTE: This sheet is a summary. It may not cover all possible information. If you have questions about this medicine, talk to your doctor, pharmacist, or health care provider.  2024 Elsevier/Gold Standard (2022-05-02 00:00:00)  Fluorouracil  Injection What is this medication? FLUOROURACIL  (flure oh YOOR a sil) treats some types of cancer. It works by slowing down the growth of cancer cells. This medicine may be used for other purposes; ask your health care provider or pharmacist if you have questions. COMMON BRAND NAME(S): Adrucil  What should I tell my care team before I take this medication? They need to know if you have any of these conditions: Blood disorders Dihydropyrimidine  dehydrogenase (DPD) deficiency Infection, such as chickenpox, cold sores, herpes Kidney disease Liver disease Poor nutrition Recent or ongoing radiation therapy An unusual or allergic reaction to fluorouracil , other medications, foods, dyes, or preservatives If you or your partner are pregnant or trying to get pregnant Breast-feeding How should I use this medication? This medication is injected into a vein. It is administered by your care team in a hospital or clinic setting. Talk to your care team about the use of this medication in children. Special care may be needed. Overdosage: If you think you have taken too much of this medicine contact a poison control center or emergency room  at once. NOTE: This medicine is only for you. Do not share this medicine with others. What if I miss a dose? Keep appointments for follow-up doses. It is important not to miss your dose. Call your care team if you are unable to keep an appointment. What may interact with this medication? Do not take this medication with any of the following: Live virus vaccines This medication may also interact with the following: Medications that treat or prevent blood clots, such as warfarin, enoxaparin, dalteparin This list may not describe all possible interactions. Give your health care provider a list of all the medicines, herbs, non-prescription drugs, or dietary supplements you use. Also tell them if you smoke, drink alcohol , or use illegal drugs. Some items may interact with your medicine. What should I watch for while using this medication? Your condition will be monitored carefully while you are receiving this medication. This medication may make you feel generally unwell. This is not uncommon as chemotherapy can affect healthy cells as well as cancer cells. Report any side effects. Continue your course of treatment even though you feel ill unless your care team tells you to stop. In some cases, you may be given additional medications to help with side effects. Follow all directions for their use. This medication may increase your risk of getting an infection. Call your care team for advice if you get a fever, chills, sore throat, or other symptoms of a cold or flu. Do not treat yourself. Try to avoid being around people who are sick. This medication may increase your risk to bruise or bleed. Call your care team if you notice any unusual bleeding. Be careful brushing or flossing your teeth or using a toothpick because you may get an infection or bleed more easily. If you have any dental work done, tell your dentist you are receiving this medication. Avoid taking medications that contain aspirin ,  acetaminophen , ibuprofen , naproxen, or ketoprofen unless instructed by your care team. These medications may hide a fever. Do not treat diarrhea with over the counter products. Contact your care team if you have diarrhea that lasts more than 2 days or if it is severe and watery. This medication can make you more sensitive to the sun. Keep out of the sun. If you cannot avoid being in the sun, wear protective clothing and sunscreen. Do not use sun lamps, tanning beds, or tanning booths. Talk to your care team if you or your partner wish to become pregnant or think you might be pregnant. This medication can cause serious birth defects if taken during pregnancy and for 3 months after the last dose. A reliable form of contraception is recommended while taking this medication and for 3 months after the last dose. Talk to your care team about effective forms of contraception. Do not father a child while taking this medication  and for 3 months after the last dose. Use a condom while having sex during this time period. Do not breastfeed while taking this medication. This medication may cause infertility. Talk to your care team if you are concerned about your fertility. What side effects may I notice from receiving this medication? Side effects that you should report to your care team as soon as possible: Allergic reactions--skin rash, itching, hives, swelling of the face, lips, tongue, or throat Heart attack--pain or tightness in the chest, shoulders, arms, or jaw, nausea, shortness of breath, cold or clammy skin, feeling faint or lightheaded Heart failure--shortness of breath, swelling of the ankles, feet, or hands, sudden weight gain, unusual weakness or fatigue Heart rhythm changes--fast or irregular heartbeat, dizziness, feeling faint or lightheaded, chest pain, trouble breathing High ammonia level--unusual weakness or fatigue, confusion, loss of appetite, nausea, vomiting, seizures Infection--fever, chills,  cough, sore throat, wounds that don't heal, pain or trouble when passing urine, general feeling of discomfort or being unwell Low red blood cell level--unusual weakness or fatigue, dizziness, headache, trouble breathing Pain, tingling, or numbness in the hands or feet, muscle weakness, change in vision, confusion or trouble speaking, loss of balance or coordination, trouble walking, seizures Redness, swelling, and blistering of the skin over hands and feet Severe or prolonged diarrhea Unusual bruising or bleeding Side effects that usually do not require medical attention (report to your care team if they continue or are bothersome): Dry skin Headache Increased tears Nausea Pain, redness, or swelling with sores inside the mouth or throat Sensitivity to light Vomiting This list may not describe all possible side effects. Call your doctor for medical advice about side effects. You may report side effects to FDA at 1-800-FDA-1088. Where should I keep my medication? This medication is given in a hospital or clinic. It will not be stored at home. NOTE: This sheet is a summary. It may not cover all possible information. If you have questions about this medicine, talk to your doctor, pharmacist, or health care provider.  2024 Elsevier/Gold Standard (2022-04-04 00:00:00)   To help prevent nausea and vomiting after your treatment, we encourage you to take your nausea medication as directed.  BELOW ARE SYMPTOMS THAT SHOULD BE REPORTED IMMEDIATELY: *FEVER GREATER THAN 100.4 F (38 C) OR HIGHER *CHILLS OR SWEATING *NAUSEA AND VOMITING THAT IS NOT CONTROLLED WITH YOUR NAUSEA MEDICATION *UNUSUAL SHORTNESS OF BREATH *UNUSUAL BRUISING OR BLEEDING *URINARY PROBLEMS (pain or burning when urinating, or frequent urination) *BOWEL PROBLEMS (unusual diarrhea, constipation, pain near the anus) TENDERNESS IN MOUTH AND THROAT WITH OR WITHOUT PRESENCE OF ULCERS (sore throat, sores in mouth, or a  toothache) UNUSUAL RASH, SWELLING OR PAIN  UNUSUAL VAGINAL DISCHARGE OR ITCHING   Items with * indicate a potential emergency and should be followed up as soon as possible or go to the Emergency Department if any problems should occur.  Please show the CHEMOTHERAPY ALERT CARD or IMMUNOTHERAPY ALERT CARD at check-in to the Emergency Department and triage nurse.  Should you have questions after your visit or need to cancel or reschedule your appointment, please contact [facility] [place] - A DEPT OF [name] [phone]  and follow the prompts.  Office hours are 8:00 a.m. to 4:30 p.m. Monday - Friday. Please note that voicemails left after 4:00 p.m. may not be returned until the following business day.  We are closed weekends and major holidays. You have access to a nurse at all times for urgent questions.  Please call the main number to the clinic [phone] and follow the prompts.  For any non-urgent questions, you may also contact your provider using MyChart. We now offer e-Visits for anyone 59 and older to request care online for non-urgent symptoms. For details visit [email or web address].   Also download the MyChart app! Go to the app store, search MyChart, open the app, select [place], and log in with your MyChart username and password.

## 2024-07-14 NOTE — Progress Notes (Signed)
 Patient presents today for chemotherapy infusion of MVASI , Irre.  Patient is in satisfactory condition with no new complaints voiced.  Vital signs are stable.  Labs reviewed and all labs are within treatment parameters.  We will proceed with treatment per MD orders.    Patient tolerated treatment well with no complaints voiced. 5FU pump connected without issue. Patient left ambulatory in stable condition.  Vital signs stable at discharge.  Will return on Wednesday for pump stop.

## 2024-07-15 ENCOUNTER — Encounter: Payer: Self-pay | Admitting: Hematology

## 2024-07-15 ENCOUNTER — Encounter (HOSPITAL_COMMUNITY): Payer: Self-pay | Admitting: Hematology

## 2024-07-15 LAB — CEA: CEA: 191 ng/mL — ABNORMAL HIGH (ref 0.0–4.7)

## 2024-07-15 NOTE — Progress Notes (Signed)
 SABRA

## 2024-07-16 ENCOUNTER — Inpatient Hospital Stay

## 2024-07-16 VITALS — BP 148/88 | HR 83 | Temp 98.4°F | Resp 18

## 2024-07-16 DIAGNOSIS — Z5189 Encounter for other specified aftercare: Secondary | ICD-10-CM | POA: Diagnosis not present

## 2024-07-16 DIAGNOSIS — C180 Malignant neoplasm of cecum: Secondary | ICD-10-CM | POA: Diagnosis not present

## 2024-07-16 DIAGNOSIS — Z5111 Encounter for antineoplastic chemotherapy: Secondary | ICD-10-CM | POA: Diagnosis not present

## 2024-07-16 DIAGNOSIS — Z95828 Presence of other vascular implants and grafts: Secondary | ICD-10-CM

## 2024-07-16 DIAGNOSIS — Z5112 Encounter for antineoplastic immunotherapy: Secondary | ICD-10-CM | POA: Diagnosis not present

## 2024-07-16 MED ORDER — HEPARIN SOD (PORK) LOCK FLUSH 100 UNIT/ML IV SOLN: 500.0000 [IU] | Freq: Once | INTRAVENOUS | Status: DC | PRN

## 2024-07-16 MED ORDER — PEGFILGRASTIM-CBQV 6 MG/0.6ML ~~LOC~~ SOSY
6.0000 mg | PREFILLED_SYRINGE | Freq: Once | SUBCUTANEOUS | Status: AC
  Administered 2024-07-16: 6 mg via SUBCUTANEOUS
  Filled 2024-07-16: qty 0.6

## 2024-07-16 MED ORDER — SODIUM CHLORIDE 0.9% FLUSH: 10.0000 mL | INTRAVENOUS | Status: DC | PRN

## 2024-07-16 NOTE — Progress Notes (Signed)
 Patient for chemotherapy pump disconnect with no complaints voiced.  Patients port flushed without difficulty.  Good blood return noted with no bruising or swelling noted at site.  Band aid applied.  VSS with discharge and left ambulatory with no s/s of distress noted.

## 2024-07-16 NOTE — Patient Instructions (Signed)

## 2024-07-21 ENCOUNTER — Other Ambulatory Visit: Payer: Self-pay | Admitting: *Deleted

## 2024-07-21 MED ORDER — MAGIC MOUTHWASH W/LIDOCAINE: 5.0000 mL | Freq: Four times a day (QID) | ORAL | 1 refills | Status: DC | PRN

## 2024-07-21 NOTE — Telephone Encounter (Signed)
 Patient called to advise that standard maalox and lidocaine  mouthwash is not helping mouth sores.  Per standing orders, will send in magic mouthwash to [facility].

## 2024-07-25 ENCOUNTER — Other Ambulatory Visit: Payer: Self-pay | Admitting: Oncology

## 2024-07-28 ENCOUNTER — Inpatient Hospital Stay

## 2024-07-28 ENCOUNTER — Encounter (HOSPITAL_COMMUNITY): Payer: Self-pay | Admitting: Oncology

## 2024-07-28 ENCOUNTER — Other Ambulatory Visit: Payer: Self-pay | Admitting: Oncology

## 2024-07-28 ENCOUNTER — Other Ambulatory Visit: Payer: Self-pay | Admitting: *Deleted

## 2024-07-28 ENCOUNTER — Encounter: Payer: Self-pay | Admitting: Oncology

## 2024-07-28 VITALS — BP 141/91 | HR 74 | Resp 19

## 2024-07-28 DIAGNOSIS — Z5111 Encounter for antineoplastic chemotherapy: Secondary | ICD-10-CM | POA: Diagnosis not present

## 2024-07-28 DIAGNOSIS — C180 Malignant neoplasm of cecum: Secondary | ICD-10-CM

## 2024-07-28 DIAGNOSIS — Z95828 Presence of other vascular implants and grafts: Secondary | ICD-10-CM

## 2024-07-28 DIAGNOSIS — Z5189 Encounter for other specified aftercare: Secondary | ICD-10-CM | POA: Diagnosis not present

## 2024-07-28 DIAGNOSIS — C189 Malignant neoplasm of colon, unspecified: Secondary | ICD-10-CM | POA: Diagnosis not present

## 2024-07-28 DIAGNOSIS — Z5112 Encounter for antineoplastic immunotherapy: Secondary | ICD-10-CM | POA: Diagnosis not present

## 2024-07-28 LAB — URINALYSIS, DIPSTICK ONLY
Bilirubin Urine: NEGATIVE
Glucose, UA: NEGATIVE mg/dL
Hgb urine dipstick: NEGATIVE
Ketones, ur: NEGATIVE mg/dL
Leukocytes,Ua: NEGATIVE
Nitrite: NEGATIVE
Protein, ur: 100 mg/dL — AB
Specific Gravity, Urine: 1.018 (ref 1.005–1.030)
pH: 6 (ref 5.0–8.0)

## 2024-07-28 LAB — CBC WITH DIFFERENTIAL/PLATELET
Abs Immature Granulocytes: 0.91 K/uL — ABNORMAL HIGH (ref 0.00–0.07)
Basophils Absolute: 0.2 K/uL — ABNORMAL HIGH (ref 0.0–0.1)
Basophils Relative: 1 %
Eosinophils Absolute: 0.1 K/uL (ref 0.0–0.5)
Eosinophils Relative: 1 %
HCT: 35.2 % — ABNORMAL LOW (ref 39.0–52.0)
Hemoglobin: 11.7 g/dL — ABNORMAL LOW (ref 13.0–17.0)
Immature Granulocytes: 7 %
Lymphocytes Relative: 14 %
Lymphs Abs: 1.8 K/uL (ref 0.7–4.0)
MCH: 31 pg (ref 26.0–34.0)
MCHC: 33.2 g/dL (ref 30.0–36.0)
MCV: 93.1 fL (ref 80.0–100.0)
Monocytes Absolute: 1.5 K/uL — ABNORMAL HIGH (ref 0.1–1.0)
Monocytes Relative: 11 %
Neutro Abs: 8.9 K/uL — ABNORMAL HIGH (ref 1.7–7.7)
Neutrophils Relative %: 66 %
Platelets: 198 K/uL (ref 150–400)
RBC: 3.78 MIL/uL — ABNORMAL LOW (ref 4.22–5.81)
RDW: 17 % — ABNORMAL HIGH (ref 11.5–15.5)
Smear Review: NORMAL
WBC: 13.5 K/uL — ABNORMAL HIGH (ref 4.0–10.5)
nRBC: 0 % (ref 0.0–0.2)

## 2024-07-28 LAB — COMPREHENSIVE METABOLIC PANEL WITH GFR
ALT: 9 U/L (ref 0–44)
AST: 18 U/L (ref 15–41)
Albumin: 3.5 g/dL (ref 3.5–5.0)
Alkaline Phosphatase: 100 U/L (ref 38–126)
Anion gap: 11 (ref 5–15)
BUN: 8 mg/dL (ref 8–23)
CO2: 21 mmol/L — ABNORMAL LOW (ref 22–32)
Calcium: 8.6 mg/dL — ABNORMAL LOW (ref 8.9–10.3)
Chloride: 101 mmol/L (ref 98–111)
Creatinine, Ser: 0.79 mg/dL (ref 0.61–1.24)
GFR, Estimated: >60 mL/min (ref >60)
Glucose, Bld: 107 mg/dL — ABNORMAL HIGH (ref 70–99)
Potassium: 3.3 mmol/L — ABNORMAL LOW (ref 3.5–5.1)
Sodium: 133 mmol/L — ABNORMAL LOW (ref 135–145)
Total Bilirubin: 0.6 mg/dL (ref 0.0–1.2)
Total Protein: 7.2 g/dL (ref 6.5–8.1)

## 2024-07-28 LAB — MAGNESIUM: Magnesium: 1.8 mg/dL (ref 1.7–2.4)

## 2024-07-28 MED ORDER — ATROPINE SULFATE 1 MG/ML IV SOLN
1.0000 mg | Freq: Once | INTRAVENOUS | Status: AC
  Administered 2024-07-28: 1 mg via INTRAVENOUS
  Filled 2024-07-28: qty 1

## 2024-07-28 MED ORDER — PALONOSETRON HCL INJECTION 0.25 MG/5ML
0.2500 mg | Freq: Once | INTRAVENOUS | Status: AC
Start: 2024-07-28 — End: 2024-07-28
  Administered 2024-07-28: 0.25 mg via INTRAVENOUS
  Filled 2024-07-28: qty 5

## 2024-07-28 MED ORDER — SODIUM CHLORIDE 0.9 % IV SOLN: Freq: Once | INTRAVENOUS | Status: AC

## 2024-07-28 MED ORDER — SODIUM CHLORIDE 0.9 % IV SOLN
400.0000 mg/m2 | Freq: Once | INTRAVENOUS | Status: AC
  Administered 2024-07-28: 780 mg via INTRAVENOUS
  Filled 2024-07-28: qty 39

## 2024-07-28 MED ORDER — OXYCODONE HCL 5 MG PO TABS
10.0000 mg | ORAL_TABLET | Freq: Once | ORAL | Status: AC
  Administered 2024-07-28: 10 mg via ORAL
  Filled 2024-07-28: qty 2

## 2024-07-28 MED ORDER — SODIUM CHLORIDE 0.9 % IV SOLN
144.0000 mg/m2 | Freq: Once | INTRAVENOUS | Status: AC
  Administered 2024-07-28: 300 mg via INTRAVENOUS
  Filled 2024-07-28: qty 15

## 2024-07-28 MED ORDER — SODIUM CHLORIDE 0.9 % IV SOLN
150.0000 mg | Freq: Once | INTRAVENOUS | Status: AC
  Administered 2024-07-28: 150 mg via INTRAVENOUS
  Filled 2024-07-28: qty 150

## 2024-07-28 MED ORDER — FLUOROURACIL CHEMO INJECTION 2.5 GM/50ML
320.0000 mg/m2 | Freq: Once | INTRAVENOUS | Status: AC
  Administered 2024-07-28: 600 mg via INTRAVENOUS
  Filled 2024-07-28: qty 12

## 2024-07-28 MED ORDER — POTASSIUM CHLORIDE CRYS ER 20 MEQ PO TBCR: 20.0000 meq | EXTENDED_RELEASE_TABLET | Freq: Every day | ORAL | 4 refills | Status: AC

## 2024-07-28 MED ORDER — CYCLOBENZAPRINE HCL 10 MG PO TABS: 10.0000 mg | ORAL_TABLET | Freq: Every day | ORAL | 0 refills | Status: DC

## 2024-07-28 MED ORDER — OXYCODONE HCL 10 MG PO TABS: 10.0000 mg | ORAL_TABLET | ORAL | 0 refills | Status: DC | PRN

## 2024-07-28 MED ORDER — DEXAMETHASONE SODIUM PHOSPHATE 10 MG/ML IJ SOLN
10.0000 mg | Freq: Once | INTRAMUSCULAR | Status: AC
  Administered 2024-07-28: 10 mg via INTRAVENOUS
  Filled 2024-07-28: qty 1

## 2024-07-28 MED ORDER — SODIUM CHLORIDE 0.9 % IV SOLN
1920.0000 mg/m2 | INTRAVENOUS | Status: DC
  Administered 2024-07-28: 3500 mg via INTRAVENOUS
  Filled 2024-07-28: qty 70

## 2024-07-28 MED ORDER — SODIUM CHLORIDE 0.9 % IV SOLN
5.0000 mg/kg | Freq: Once | INTRAVENOUS | Status: AC
  Administered 2024-07-28: 400 mg via INTRAVENOUS
  Filled 2024-07-28: qty 16

## 2024-07-28 MED ORDER — POTASSIUM CHLORIDE CRYS ER 20 MEQ PO TBCR
40.0000 meq | EXTENDED_RELEASE_TABLET | Freq: Once | ORAL | Status: AC
  Administered 2024-07-28: 40 meq via ORAL
  Filled 2024-07-28: qty 2

## 2024-07-28 NOTE — Patient Instructions (Signed)
 [facility] [place] - A DEPT OF [place]. [facility]  Discharge Instructions: Thank you for choosing [facility] to provide your oncology and hematology care.  If you have a lab appointment with the Cancer Center - please note that after April 8th, 2024, all labs will be drawn in the cancer center.  You do not have to check in or register with the main entrance as you have in the past but will complete your check-in in the cancer center.  Wear comfortable clothing and clothing appropriate for easy access to any Portacath or PICC line.   We strive to give you quality time with your provider. You may need to reschedule your appointment if you arrive late (15 or more minutes).  Arriving late affects you and other patients whose appointments are after yours.  Also, if you miss three or more appointments without notifying the office, you may be dismissed from the clinic at the provider's discretion.      For prescription refill requests, have your pharmacy contact our office and allow 72 hours for refills to be completed.    Today you received the following chemotherapy and/or immunotherapy agents MVASI , Irinotecan  and Adruicil.       To help prevent nausea and vomiting after your treatment, we encourage you to take your nausea medication as directed.  BELOW ARE SYMPTOMS THAT SHOULD BE REPORTED IMMEDIATELY: *FEVER GREATER THAN 100.4 F (38 C) OR HIGHER *CHILLS OR SWEATING *NAUSEA AND VOMITING THAT IS NOT CONTROLLED WITH YOUR NAUSEA MEDICATION *UNUSUAL SHORTNESS OF BREATH *UNUSUAL BRUISING OR BLEEDING *URINARY PROBLEMS (pain or burning when urinating, or frequent urination) *BOWEL PROBLEMS (unusual diarrhea, constipation, pain near the anus) TENDERNESS IN MOUTH AND THROAT WITH OR WITHOUT PRESENCE OF ULCERS (sore throat, sores in mouth, or a toothache) UNUSUAL RASH, SWELLING OR PAIN  UNUSUAL VAGINAL DISCHARGE OR ITCHING   Items with * indicate a potential emergency  and should be followed up as soon as possible or go to the Emergency Department if any problems should occur.  Please show the CHEMOTHERAPY ALERT CARD or IMMUNOTHERAPY ALERT CARD at check-in to the Emergency Department and triage nurse.  Should you have questions after your visit or need to cancel or reschedule your appointment, please contact [facility] [place] - A DEPT OF [name] [phone]  and follow the prompts.  Office hours are 8:00 a.m. to 4:30 p.m. Monday - Friday. Please note that voicemails left after 4:00 p.m. may not be returned until the following business day.  We are closed weekends and major holidays. You have access to a nurse at all times for urgent questions. Please call the main number to the clinic [phone] and follow the prompts.  For any non-urgent questions, you may also contact your provider using MyChart. We now offer e-Visits for anyone 80 and older to request care online for non-urgent symptoms. For details visit [email or web address].   Also download the MyChart app! Go to the app store, search MyChart, open the app, select [place], and log in with your MyChart username and password.

## 2024-07-28 NOTE — Progress Notes (Signed)
 Reviewed vital signs and labs, and urine protein with oncologist.  Manuelita to treat verbal order Dr. Davonna and see pre treatment assessment flowsheet.   Patient tolerated chemotherapy with no complaints voiced.  Side effects with management reviewed with understanding verbalized.  Port site clean and dry with no bruising or swelling noted at site.  Good blood return noted before and after administration of chemotherapy.  Chemo pump connected with no alarms noted.   Patient left in satisfactory condition with VSS and no s/s of distress noted.

## 2024-07-29 ENCOUNTER — Other Ambulatory Visit: Payer: Self-pay | Admitting: *Deleted

## 2024-07-29 LAB — CEA: CEA: 211 ng/mL — ABNORMAL HIGH (ref 0.0–4.7)

## 2024-07-29 MED ORDER — OXYCODONE HCL 10 MG PO TABS: 10.0000 mg | ORAL_TABLET | ORAL | 0 refills | Status: DC | PRN

## 2024-07-30 ENCOUNTER — Inpatient Hospital Stay

## 2024-07-30 VITALS — BP 157/91 | HR 74 | Temp 98.0°F | Resp 16

## 2024-07-30 DIAGNOSIS — Z5189 Encounter for other specified aftercare: Secondary | ICD-10-CM | POA: Diagnosis not present

## 2024-07-30 DIAGNOSIS — C180 Malignant neoplasm of cecum: Secondary | ICD-10-CM

## 2024-07-30 DIAGNOSIS — Z5111 Encounter for antineoplastic chemotherapy: Secondary | ICD-10-CM | POA: Diagnosis not present

## 2024-07-30 DIAGNOSIS — Z5112 Encounter for antineoplastic immunotherapy: Secondary | ICD-10-CM | POA: Diagnosis not present

## 2024-07-30 DIAGNOSIS — Z95828 Presence of other vascular implants and grafts: Secondary | ICD-10-CM

## 2024-07-30 MED ORDER — PEGFILGRASTIM-CBQV 6 MG/0.6ML ~~LOC~~ SOSY
6.0000 mg | PREFILLED_SYRINGE | Freq: Once | SUBCUTANEOUS | Status: AC
  Administered 2024-07-30: 6 mg via SUBCUTANEOUS
  Filled 2024-07-30: qty 0.6

## 2024-07-30 NOTE — Patient Instructions (Signed)
 [facility] [place] - A DEPT OF [place]  Discharge Instructions: Thank you for choosing [facility] to provide your oncology and hematology care.  If you have a lab appointment with the Cancer Center - please note that after April 8th, 2024, all labs will be drawn in the cancer center.  You do not have to check in or register with the main entrance as you have in the past but will complete your check-in in the cancer center.  Wear comfortable clothing and clothing appropriate for easy access to any Portacath or PICC line.   We strive to give you quality time with your provider. You may need to reschedule your appointment if you arrive late (15 or more minutes).  Arriving late affects you and other patients whose appointments are after yours.  Also, if you miss three or more appointments without notifying the office, you may be dismissed from the clinic at the provider's discretion.      For prescription refill requests, have your pharmacy contact our office and allow 72 hours for refills to be completed.    Today you received the following chemotherapy and/or immunotherapy agents Udenyca  and 5FU chemotherapy pump disconnection.      To help prevent nausea and vomiting after your treatment, we encourage you to take your nausea medication as directed.  BELOW ARE SYMPTOMS THAT SHOULD BE REPORTED IMMEDIATELY: *FEVER GREATER THAN 100.4 F (38 C) OR HIGHER *CHILLS OR SWEATING *NAUSEA AND VOMITING THAT IS NOT CONTROLLED WITH YOUR NAUSEA MEDICATION *UNUSUAL SHORTNESS OF BREATH *UNUSUAL BRUISING OR BLEEDING *URINARY PROBLEMS (pain or burning when urinating, or frequent urination) *BOWEL PROBLEMS (unusual diarrhea, constipation, pain near the anus) TENDERNESS IN MOUTH AND THROAT WITH OR WITHOUT PRESENCE OF ULCERS (sore throat, sores in mouth, or a toothache) UNUSUAL RASH, SWELLING OR PAIN  UNUSUAL VAGINAL DISCHARGE OR ITCHING   Items with * indicate a  potential emergency and should be followed up as soon as possible or go to the Emergency Department if any problems should occur.  Please show the CHEMOTHERAPY ALERT CARD or IMMUNOTHERAPY ALERT CARD at check-in to the Emergency Department and triage nurse.  Should you have questions after your visit or need to cancel or reschedule your appointment, please contact [facility] [place] - A DEPT OF [name] [phone]  and follow the prompts.  Office hours are 8:00 a.m. to 4:30 p.m. Monday - Friday. Please note that voicemails left after 4:00 p.m. may not be returned until the following business day.  We are closed weekends and major holidays. You have access to a nurse at all times for urgent questions. Please call the main number to the clinic [phone] and follow the prompts.  For any non-urgent questions, you may also contact your provider using MyChart. We now offer e-Visits for anyone 45 and older to request care online for non-urgent symptoms. For details visit [email or web address].   Also download the MyChart app! Go to the app store, search MyChart, open the app, select [place], and log in with your MyChart username and password.

## 2024-07-30 NOTE — Progress Notes (Signed)
 Patient presents today for 5FU chemotherapy pump disconnection and Udenyca  injection per provider's order. Vital signs stable and patient voiced no new complaints at this time.   Port flushed easily with 10 mL of normal saline and 5 mL of heparin . Good blood return noted and needle removed intact. No bruising or swelling noted at the site.  Discharged from clinic ambulatory in stable condition. Alert and oriented x 3. F/U with [facility] as scheduled.

## 2024-08-18 ENCOUNTER — Encounter: Payer: Self-pay | Admitting: Oncology

## 2024-08-18 ENCOUNTER — Inpatient Hospital Stay

## 2024-08-18 ENCOUNTER — Inpatient Hospital Stay: Attending: Hematology | Admitting: Physician Assistant

## 2024-08-18 VITALS — BP 123/80 | HR 60 | Temp 96.9°F | Resp 18

## 2024-08-18 DIAGNOSIS — Z95828 Presence of other vascular implants and grafts: Secondary | ICD-10-CM | POA: Diagnosis not present

## 2024-08-18 DIAGNOSIS — Z5189 Encounter for other specified aftercare: Secondary | ICD-10-CM | POA: Insufficient documentation

## 2024-08-18 DIAGNOSIS — Z87891 Personal history of nicotine dependence: Secondary | ICD-10-CM | POA: Diagnosis not present

## 2024-08-18 DIAGNOSIS — I10 Essential (primary) hypertension: Secondary | ICD-10-CM | POA: Diagnosis not present

## 2024-08-18 DIAGNOSIS — Z5112 Encounter for antineoplastic immunotherapy: Secondary | ICD-10-CM | POA: Insufficient documentation

## 2024-08-18 DIAGNOSIS — C189 Malignant neoplasm of colon, unspecified: Secondary | ICD-10-CM

## 2024-08-18 DIAGNOSIS — R071 Chest pain on breathing: Secondary | ICD-10-CM | POA: Insufficient documentation

## 2024-08-18 DIAGNOSIS — M6289 Other specified disorders of muscle: Secondary | ICD-10-CM

## 2024-08-18 DIAGNOSIS — C180 Malignant neoplasm of cecum: Secondary | ICD-10-CM | POA: Insufficient documentation

## 2024-08-18 DIAGNOSIS — Z5111 Encounter for antineoplastic chemotherapy: Secondary | ICD-10-CM | POA: Insufficient documentation

## 2024-08-18 DIAGNOSIS — Z79899 Other long term (current) drug therapy: Secondary | ICD-10-CM | POA: Insufficient documentation

## 2024-08-18 DIAGNOSIS — C7972 Secondary malignant neoplasm of left adrenal gland: Secondary | ICD-10-CM | POA: Diagnosis not present

## 2024-08-18 DIAGNOSIS — D696 Thrombocytopenia, unspecified: Secondary | ICD-10-CM | POA: Insufficient documentation

## 2024-08-18 DIAGNOSIS — C7802 Secondary malignant neoplasm of left lung: Secondary | ICD-10-CM | POA: Diagnosis not present

## 2024-08-18 DIAGNOSIS — K1230 Oral mucositis (ulcerative), unspecified: Secondary | ICD-10-CM

## 2024-08-18 DIAGNOSIS — E876 Hypokalemia: Secondary | ICD-10-CM | POA: Insufficient documentation

## 2024-08-18 DIAGNOSIS — C7801 Secondary malignant neoplasm of right lung: Secondary | ICD-10-CM | POA: Insufficient documentation

## 2024-08-18 LAB — COMPREHENSIVE METABOLIC PANEL WITH GFR
ALT: 9 U/L (ref 0–44)
AST: 19 U/L (ref 15–41)
Albumin: 3.1 g/dL — ABNORMAL LOW (ref 3.5–5.0)
Alkaline Phosphatase: 81 U/L (ref 38–126)
Anion gap: 10 (ref 5–15)
BUN: 12 mg/dL (ref 8–23)
CO2: 21 mmol/L — ABNORMAL LOW (ref 22–32)
Calcium: 8.4 mg/dL — ABNORMAL LOW (ref 8.9–10.3)
Chloride: 104 mmol/L (ref 98–111)
Creatinine, Ser: 1.25 mg/dL — ABNORMAL HIGH (ref 0.61–1.24)
GFR, Estimated: >60 mL/min (ref >60)
Glucose, Bld: 98 mg/dL (ref 70–99)
Potassium: 3.5 mmol/L (ref 3.5–5.1)
Sodium: 135 mmol/L (ref 135–145)
Total Bilirubin: 0.7 mg/dL (ref 0.0–1.2)
Total Protein: 7.1 g/dL (ref 6.5–8.1)

## 2024-08-18 LAB — URINALYSIS, DIPSTICK ONLY
Bilirubin Urine: NEGATIVE
Glucose, UA: NEGATIVE mg/dL
Ketones, ur: NEGATIVE mg/dL
Leukocytes,Ua: NEGATIVE
Nitrite: NEGATIVE
Protein, ur: 100 mg/dL — AB
Specific Gravity, Urine: 1.018 (ref 1.005–1.030)
pH: 5 (ref 5.0–8.0)

## 2024-08-18 LAB — CBC WITH DIFFERENTIAL/PLATELET
Abs Immature Granulocytes: 0.7 K/uL — ABNORMAL HIGH (ref 0.00–0.07)
Basophils Absolute: 0.2 K/uL — ABNORMAL HIGH (ref 0.0–0.1)
Basophils Relative: 1 %
Eosinophils Absolute: 0.4 K/uL (ref 0.0–0.5)
Eosinophils Relative: 3 %
HCT: 35.9 % — ABNORMAL LOW (ref 39.0–52.0)
Hemoglobin: 11.6 g/dL — ABNORMAL LOW (ref 13.0–17.0)
Immature Granulocytes: 5 %
Lymphocytes Relative: 20 %
Lymphs Abs: 2.8 K/uL (ref 0.7–4.0)
MCH: 30 pg (ref 26.0–34.0)
MCHC: 32.3 g/dL (ref 30.0–36.0)
MCV: 92.8 fL (ref 80.0–100.0)
Monocytes Absolute: 2.8 K/uL — ABNORMAL HIGH (ref 0.1–1.0)
Monocytes Relative: 20 %
Neutro Abs: 7.1 K/uL (ref 1.7–7.7)
Neutrophils Relative %: 51 %
Platelets: 176 K/uL (ref 150–400)
RBC: 3.87 MIL/uL — ABNORMAL LOW (ref 4.22–5.81)
RDW: 17 % — ABNORMAL HIGH (ref 11.5–15.5)
WBC: 14 K/uL — ABNORMAL HIGH (ref 4.0–10.5)
nRBC: 0 % (ref 0.0–0.2)

## 2024-08-18 LAB — MAGNESIUM: Magnesium: 1.9 mg/dL (ref 1.7–2.4)

## 2024-08-18 MED ORDER — PALONOSETRON HCL INJECTION 0.25 MG/5ML
0.2500 mg | Freq: Once | INTRAVENOUS | Status: AC
  Administered 2024-08-18: 0.25 mg via INTRAVENOUS
  Filled 2024-08-18: qty 5

## 2024-08-18 MED ORDER — SODIUM CHLORIDE 0.9 % IV SOLN: Freq: Once | INTRAVENOUS | Status: AC

## 2024-08-18 MED ORDER — SODIUM CHLORIDE 0.9 % IV SOLN
150.0000 mg | Freq: Once | INTRAVENOUS | Status: AC
  Administered 2024-08-18: 150 mg via INTRAVENOUS
  Filled 2024-08-18: qty 150

## 2024-08-18 MED ORDER — SODIUM CHLORIDE 0.9 % IV SOLN
5.0000 mg/kg | Freq: Once | INTRAVENOUS | Status: AC
  Administered 2024-08-18: 400 mg via INTRAVENOUS
  Filled 2024-08-18: qty 16

## 2024-08-18 MED ORDER — ATROPINE SULFATE 1 MG/ML IV SOLN
1.0000 mg | Freq: Once | INTRAVENOUS | Status: AC | PRN
  Administered 2024-08-18: 1 mg via INTRAVENOUS
  Filled 2024-08-18: qty 1

## 2024-08-18 MED ORDER — CYCLOBENZAPRINE HCL 10 MG PO TABS: 10.0000 mg | ORAL_TABLET | Freq: Every day | ORAL | 0 refills | Status: AC

## 2024-08-18 MED ORDER — DEXAMETHASONE SODIUM PHOSPHATE 10 MG/ML IJ SOLN
10.0000 mg | Freq: Once | INTRAMUSCULAR | Status: AC
  Administered 2024-08-18: 10 mg via INTRAVENOUS
  Filled 2024-08-18: qty 1

## 2024-08-18 MED ORDER — SODIUM CHLORIDE 0.9 % IV SOLN
144.0000 mg/m2 | Freq: Once | INTRAVENOUS | Status: AC
  Administered 2024-08-18: 300 mg via INTRAVENOUS
  Filled 2024-08-18: qty 15

## 2024-08-18 MED ORDER — SODIUM CHLORIDE 0.9 % IV SOLN
3100.0000 mg | INTRAVENOUS | Status: DC
  Administered 2024-08-18: 3100 mg via INTRAVENOUS
  Filled 2024-08-18: qty 62

## 2024-08-18 MED ORDER — MAGIC MOUTHWASH W/LIDOCAINE: 5.0000 mL | Freq: Four times a day (QID) | ORAL | 1 refills | Status: AC | PRN

## 2024-08-18 MED ORDER — FLUOROURACIL CHEMO INJECTION 2.5 GM/50ML
320.0000 mg/m2 | Freq: Once | INTRAVENOUS | Status: AC
  Administered 2024-08-18: 600 mg via INTRAVENOUS
  Filled 2024-08-18: qty 12

## 2024-08-18 MED ORDER — SODIUM CHLORIDE 0.9 % IV SOLN
400.0000 mg/m2 | Freq: Once | INTRAVENOUS | Status: AC
  Administered 2024-08-18: 780 mg via INTRAVENOUS
  Filled 2024-08-18: qty 39

## 2024-08-18 NOTE — Progress Notes (Signed)
 5FU pump decreased by ~10% to 3100 mg for mucositis per Dr. Davonna. Norleen JAYSON Sou, [facility] 08/18/24 10:25 AM

## 2024-08-18 NOTE — Patient Instructions (Signed)
 [facility] [place] - A DEPT OF [place]  Discharge Instructions: Thank you for choosing [facility] to provide your oncology and hematology care.  If you have a lab appointment with the Cancer Center - please note that after April 8th, 2024, all labs will be drawn in the cancer center.  You do not have to check in or register with the main entrance as you have in the past but will complete your check-in in the cancer center.  Wear comfortable clothing and clothing appropriate for easy access to any Portacath or PICC line.   We strive to give you quality time with your provider. You may need to reschedule your appointment if you arrive late (15 or more minutes).  Arriving late affects you and other patients whose appointments are after yours.  Also, if you miss three or more appointments without notifying the office, you may be dismissed from the clinic at the provider's discretion.      For prescription refill requests, have your pharmacy contact our office and allow 72 hours for refills to be completed.    Today you received the following chemotherapy and/or immunotherapy agents MVASI , Folfiri with 5FU pump start      To help prevent nausea and vomiting after your treatment, we encourage you to take your nausea medication as directed.  BELOW ARE SYMPTOMS THAT SHOULD BE REPORTED IMMEDIATELY: *FEVER GREATER THAN 100.4 F (38 C) OR HIGHER *CHILLS OR SWEATING *NAUSEA AND VOMITING THAT IS NOT CONTROLLED WITH YOUR NAUSEA MEDICATION *UNUSUAL SHORTNESS OF BREATH *UNUSUAL BRUISING OR BLEEDING *URINARY PROBLEMS (pain or burning when urinating, or frequent urination) *BOWEL PROBLEMS (unusual diarrhea, constipation, pain near the anus) TENDERNESS IN MOUTH AND THROAT WITH OR WITHOUT PRESENCE OF ULCERS (sore throat, sores in mouth, or a toothache) UNUSUAL RASH, SWELLING OR PAIN  UNUSUAL VAGINAL DISCHARGE OR ITCHING   Items with * indicate a potential emergency  and should be followed up as soon as possible or go to the Emergency Department if any problems should occur.  Please show the CHEMOTHERAPY ALERT CARD or IMMUNOTHERAPY ALERT CARD at check-in to the Emergency Department and triage nurse.  Should you have questions after your visit or need to cancel or reschedule your appointment, please contact [facility] [place] - A DEPT OF [name] [phone]  and follow the prompts.  Office hours are 8:00 a.m. to 4:30 p.m. Monday - Friday. Please note that voicemails left after 4:00 p.m. may not be returned until the following business day.  We are closed weekends and major holidays. You have access to a nurse at all times for urgent questions. Please call the main number to the clinic [phone] and follow the prompts.  For any non-urgent questions, you may also contact your provider using MyChart. We now offer e-Visits for anyone 57 and older to request care online for non-urgent symptoms. For details visit [email or web address].   Also download the MyChart app! Go to the app store, search MyChart, open the app, select [place], and log in with your MyChart username and password.

## 2024-08-18 NOTE — Progress Notes (Signed)
 Decrease 5FU pump by an additional 10% (1728 mg/m2) for mucositis.  T.O. Dr Ivery Molt, PharmD

## 2024-08-18 NOTE — Addendum Note (Signed)
 Addended by: Jami Ohlin on: 08/18/2024 09:49 PM   Modules accepted: Level of Service

## 2024-08-18 NOTE — Progress Notes (Signed)
 [facility]   PROGRESS NOTE  Patient Care Team: Jolee Elsie RAMAN, [name] as PCP - General (Physician Assistant) Celestia Joesph SQUIBB, RN as Oncology Nurse Navigator (Medical Oncology)   CHIEF COMPLAINTS/PURPOSE OF CONSULTATION:  Metastatic colon cancer to the lungs and left adrenal gland  ONCOLOGIC HISTORY: Presentation with dry cough for 6 months, 30 pound weight loss in the last couple of years, weight stable over the last 6 months. CT chest with contrast on 12/16/2021 showed bulky left hilar mass measuring 8.1 x 7.5 cm.  Numerous bilateral lung nodules of varying sizes.  Left adrenal nodule measuring 2.8 x 2.2 cm.  Mediastinal and bilateral hilar adenopathy with the largest pretracheal node measuring 4.1 x 3.1 cm. MRI of the brain from 01/04/2022 which was negative for metastatic disease.  CTAP from 01/03/2022 which showed isolated left adrenal metastasis with no other evidence of metastatic disease in the abdomen or pelvis. Pathology of left lung biopsy which shows adenocarcinoma with necrosis.  CK20 positive and CDX2 positive but negative for CK7 indicating colonic primary. 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  HER2 negative.  TMB low.  MSI-stable.  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  Other targetable mutations negative. FOLFIRI started on 02/22/2022, bevacizumab  added with cycle 4  CT CAP (05/17/2022): Mediastinal and hilar lymph nodes have decreased in size.  Largest perihilar left lower lobe lung mass has decreased in size.  Right upper lobe mass slightly increased in size.  Other nodules are stable.  Left adrenal mass decreased to 1.3 cm from 2.8 cm. CT scan showed mixed response as it was compared to CT from 12/16/2021.  He did not start chemotherapy until 02/22/2022. Maintenance 5-FU and bevacizumab  from September 2023 through 08/29/2023 with progression FOLFIRI and bevacizumab  started on 09/12/2023  CURRENT TREATMENT: FOLFIRI plus Bevacizumab   INTERVAL HISTORY:  Evan [name]  68 y.o. male returns for a follow up prior to next cycle of FOLFIRI plus bevacizumab . He was last seen by Dr. Rogers on 06/30/2024. In the interim, he denies any changes to his health. He is unaccompanied for this visit.  On exam today, Mr. Reports that his energy levels are fairly stable and he can complete his ADLs.  He woke up yesterday with right sided neck stiffness which affects his right upper extremity ambulation.  He adds that this happened at the last visit and symptoms resolved after taking Flexeril .  He reports having ongoing mucositis which affects his p.o. intake.  He reports the last cycle, the mucositis started shortly after treatment and lasted the entire 2 weeks.  He denies nausea, vomiting or abdominal pain.  He reports having episodes of constipation and diarrhea.  He takes supportive medications to manage both.  He denies easy bruising or signs of active bleeding.  He continues to take oxycodone  every 4 hours as needed to help with sternal/chest pain.  Denies fevers, chills, night sweats, shortness of breath, cough, headaches, dizziness or neuropathy.  Planes.  Rest of the 10 point ROS is below.   MEDICAL HISTORY:  Past Medical History:  Diagnosis Date   Arthritis    Colon cancer (HCC)    colon   Hepatitis C    Hypertension    Port-A-Cath in place 02/15/2022    SURGICAL HISTORY: Past Surgical History:  Procedure Laterality Date   BIOPSY  03/12/2023   Procedure: BIOPSY;  Surgeon: Cindie Carlin POUR, DO;  [facility]: [facility];  Service: Endoscopy;;   COLON SURGERY  ESOPHAGEAL BANDING N/A 03/12/2023   Procedure: ESOPHAGEAL BANDING;  Surgeon: Cindie Carlin POUR, DO;  Location: [facility];  Service: Endoscopy;  Laterality: N/A;   ESOPHAGOGASTRODUODENOSCOPY N/A 07/09/2024   Procedure: EGD (ESOPHAGOGASTRODUODENOSCOPY);  Surgeon: Cindie Carlin POUR, DO;  [facility]: [facility];  Service: Endoscopy;  Laterality: N/A;  130pm, asa 3   ESOPHAGOGASTRODUODENOSCOPY (EGD) WITH  PROPOFOL  N/A 03/12/2023   Procedure: ESOPHAGOGASTRODUODENOSCOPY (EGD) WITH PROPOFOL ;  Surgeon: Cindie Carlin POUR, DO;  [facility];  Service: Endoscopy;  Laterality: N/A;  11:30AM; ASA 3   PORTACATH PLACEMENT Right 02/08/2022   Procedure: INSERTION PORT-A-CATH- RIJ;  Surgeon: Evonnie Dorothyann LABOR, DO;  Location: [facility];  Service: General;  Laterality: Right;   REPLACEMENT TOTAL KNEE Left    SHOULDER ARTHROSCOPY Bilateral    TOTAL HIP ARTHROPLASTY Right 11/09/2020   Procedure: TOTAL HIP ARTHROPLASTY ANTERIOR APPROACH;  Surgeon: Beverley Evalene BIRCH, MD;  Location: [facility];  Service: Orthopedics;  Laterality: Right;   WRIST SURGERY Left     SOCIAL HISTORY: Social History   Socioeconomic History   Marital status: Married    Spouse name: Not on file   Number of children: 7   Years of education: Not on file   Highest education level: Not on file  Occupational History   Occupation: employed  Tobacco Use   Smoking status: Former    Current packs/day: 0.00    Average packs/day: 1.5 packs/day for 20.0 years (30.0 ttl pk-yrs)    Types: Cigarettes    Start date: 11/19/1985    Quit date: 11/19/2005    Years since quitting: 18.7   Smokeless tobacco: Never  Vaping Use   Vaping status: Never Used  Substance and Sexual Activity   Alcohol  use: Not Currently   Drug use: Never   Sexual activity: Yes  Other Topics Concern   Not on file  Social History Narrative   ** Merged History Encounter **       Separated from wife 11/2021   Social Drivers of Health   Financial Resource Strain: Low Risk  (10/07/2019)   Overall Financial Resource Strain (CARDIA)    Difficulty of Paying Living Expenses: Not very hard  Food Insecurity: No Food Insecurity (10/07/2019)   Hunger Vital Sign    Worried About Running Out of Food in the Last Year: Never true    Ran Out of Food in the Last Year: Never true  Transportation Needs: No Transportation Needs (10/07/2019)   PRAPARE - Therapist, art (Medical): No    Lack of Transportation (Non-Medical): No  Physical Activity: Inactive (10/07/2019)   Exercise Vital Sign    Days of Exercise per Week: 0 days    Minutes of Exercise per Session: 0 min  Stress: No Stress Concern Present (10/07/2019)   [employer] of Occupational Health - Occupational Stress Questionnaire    Feeling of Stress : Not at all  Social Connections: Moderately Integrated (10/07/2019)   Social Connection and Isolation Panel    Frequency of Communication with Friends and Family: Once a week    Frequency of Social Gatherings with Friends and Family: Once a week    Attends Religious Services: More than 4 times per year    Active Member of [employer] or Organizations: Yes    Attends Banker Meetings: More than 4 times per year    Marital Status: Married  Catering manager Violence: Not At Risk (10/07/2019)   Humiliation, Afraid, Rape, and Kick questionnaire  Fear of Current or Ex-Partner: No    Emotionally Abused: No    Physically Abused: No    Sexually Abused: No    FAMILY HISTORY: Family History  Problem Relation Age of Onset   Heart disease Mother    Dementia Father    Heart disease Sister    Cancer Brother    Hypertension Brother    Hypertension Brother    Healthy Son    Healthy Son    Healthy Son    Healthy Son    Healthy Daughter    Healthy Daughter    Healthy Daughter     ALLERGIES:  has no known allergies.  MEDICATIONS:  Current Outpatient Medications  Medication Sig Dispense Refill   aluminum -magnesium  hydroxide-simethicone  (MAALOX) 200-200-20 MG/5ML SUSP Take 30 mLs by mouth 4 (four) times daily -  before meals and at bedtime. 1680 mL 2   amLODipine  (NORVASC ) 10 MG tablet Take 1 tablet (10 mg total) by mouth daily. 90 tablet 3   Bevacizumab  (AVASTIN  IV) Inject into the vein every 14 (fourteen) days. *start date TBD     dexamethasone  (DECADRON ) 0.5 MG/5ML solution 10ml swish and spit twice daily for  5 days after each chemo treatment 240 mL 3   ENULOSE  10 GM/15ML SOLN Take 10 g by mouth daily as needed.     fluorouracil  CALGB 19297 2,400 mg/m2 in sodium chloride  0.9 % 150 mL Inject 2,400 mg/m2 into the vein over 48 hr.     FLUOROURACIL  IV Inject into the vein every 14 (fourteen) days.     Lactulose  20 GM/30ML SOLN Take 15 mLs (10 g total) by mouth at bedtime. Take 15 ml at bedtime every night to assist with regular bowel movements.  Titrate down if having multiple bowel movements.  If a bowel movement has not occurred in 3 to 4 days or longer, then take 15 ml every 3 hours until a bowel movent has occurred. 450 mL 5   LEUCOVORIN  CALCIUM  IV Inject into the vein every 14 (fourteen) days.     lidocaine -prilocaine  (EMLA ) cream Apply 1 Application topically as needed (Apply to port site 30 mins- 1 hour prior to treatment/labs.). 30 g 0   lisinopril  (ZESTRIL ) 10 MG tablet TAKE ONE TABLET BY MOUTH DAILY 30 tablet 3   loperamide  (IMODIUM ) 2 MG capsule TAKE 2 CAPSULES BY MOUTH AFTER FIRST LOOSE STOOL,AND THEN 1 CAPSULE AFTER 2ND LOOSE STOOL. DO NOT EXCEED 8 CAPS IN 24 HOURS 30 capsule 0   megestrol  (MEGACE ) 400 MG/10ML suspension Take 10 mLs (400 mg total) by mouth 2 (two) times daily. 480 mL 3   Misc. Devices MISC Please provide patient with 1:1 ratio of magic mouthwash and lidocaine  to swish and swallow QID 1 each 3   naloxone  (NARCAN ) nasal spray 4 mg/0.1 mL 0.1ml nasal spray in one nostril. May repeat the dose in other nostril in 2-3 minutes if needed. 2 each 3   Oxycodone  HCl 10 MG TABS Take 1 tablet (10 mg total) by mouth every 4 (four) hours as needed. 180 tablet 0   pantoprazole  (PROTONIX ) 40 MG tablet Take 1 tablet (40 mg total) by mouth daily. 30 tablet 11   potassium chloride  SA (KLOR-CON  M) 20 MEQ tablet Take 1 tablet (20 mEq total) by mouth daily. 90 tablet 4   prochlorperazine  (COMPAZINE ) 10 MG tablet Take 1 tablet (10 mg total) by mouth every 6 (six) hours as needed (NAUSEA). 60 tablet 2    cyclobenzaprine  (FLEXERIL ) 10 MG tablet Take  1 tablet (10 mg total) by mouth at bedtime. 5 tablet 0   magic mouthwash w/lidocaine  SOLN Take 5 mLs by mouth 4 (four) times daily as needed for mouth pain. 250 mL 1   No current facility-administered medications for this visit.   Facility-Administered Medications Ordered in Other Visits  Medication Dose Route Frequency Provider Last Rate Last Admin   fluorouracil  (ADRUCIL ) 3,100 mg in sodium chloride  0.9 % 88 mL chemo infusion  3,100 mg Intravenous 1 day or 1 dose Kandala, Hyndavi, MD       fluorouracil  (ADRUCIL ) chemo injection 600 mg  320 mg/m2 (Treatment Plan Recorded) Intravenous Once Kandala, Hyndavi, MD       irinotecan  (CAMPTOSAR ) 300 mg in sodium chloride  0.9 % 500 mL chemo infusion  144 mg/m2 (Treatment Plan Recorded) Intravenous Once Kandala, Hyndavi, MD 343 mL/hr at 08/18/24 1133 300 mg at 08/18/24 1133   leucovorin  780 mg in sodium chloride  0.9 % 250 mL infusion  400 mg/m2 (Treatment Plan Recorded) Intravenous Once Kandala, Hyndavi, MD 193 mL/hr at 08/18/24 1129 780 mg at 08/18/24 1129   potassium chloride  SA (KLOR-CON  M) CR tablet 40 mEq  40 mEq Oral Once Katragadda, Sreedhar, MD        REVIEW OF SYSTEMS:   Constitutional: ( - ) fevers, ( - )  chills , ( - ) night sweats Eyes: ( - ) blurriness of vision, ( - ) double vision, ( - ) watery eyes Ears, nose, mouth, throat, and face: ( +) mucositis, ( - ) sore throat Respiratory: ( - ) cough, ( - ) dyspnea, ( - ) wheezes Cardiovascular: ( - ) palpitation, ( - ) chest discomfort, ( - ) lower extremity swelling Gastrointestinal:  ( - ) nausea, ( - ) heartburn, ( + ) change in bowel habits Skin: ( - ) abnormal skin rashes Lymphatics: ( - ) new lymphadenopathy, ( - ) easy bruising Neurological: ( - ) numbness, ( - ) tingling, ( - ) new weaknesses Behavioral/Psych: ( - ) mood change, ( - ) new changes  All other systems were reviewed with the patient and are negative.  PHYSICAL  EXAMINATION: ECOG PERFORMANCE STATUS: 1 - Symptomatic but completely ambulatory  There were no vitals filed for this visit. There were no vitals filed for this visit.  GENERAL: well appearing male in NAD  SKIN: skin color, texture, turgor are normal, no rashes or significant lesions EYES: conjunctiva are pink and non-injected, sclera clear OROPHARYNX: no exudate; lips, buccal mucosa, and tongue normal. Mild erythema noted in back of the mouth without ulceration NECK: supple, non-tender. Muscle tightness noted in right neck.   LUNGS: clear to auscultation and percussion with normal breathing effort HEART: regular rate & rhythm and no murmurs and no lower extremity edema ABDOMEN: soft, non-tender, non-distended, normal bowel sounds Musculoskeletal: no cyanosis of digits and no clubbing  PSYCH: alert & oriented x 3, fluent speech NEURO: no focal motor/sensory deficits  LABORATORY DATA:  I have reviewed the data as listed    Latest Ref Rng & Units 08/18/2024    9:06 AM 07/28/2024    8:42 AM 07/14/2024    9:09 AM  CBC  WBC 4.0 - 10.5 K/uL 14.0  13.5  9.4   Hemoglobin 13.0 - 17.0 g/dL 88.3  88.2  89.4   Hematocrit 39.0 - 52.0 % 35.9  35.2  33.0   Platelets 150 - 400 K/uL 176  198  139        Latest Ref  Rng & Units 08/18/2024    9:06 AM 07/28/2024    8:42 AM 07/14/2024    9:09 AM  CMP  Glucose 70 - 99 mg/dL 98  892  877   BUN 8 - 23 mg/dL 12  8  9    Creatinine 0.61 - 1.24 mg/dL 8.74  9.20  9.03   Sodium 135 - 145 mmol/L 135  133  139   Potassium 3.5 - 5.1 mmol/L 3.5  3.3  3.5   Chloride 98 - 111 mmol/L 104  101  107   CO2 22 - 32 mmol/L 21  21  23    Calcium  8.9 - 10.3 mg/dL 8.4  8.6  8.3   Total Protein 6.5 - 8.1 g/dL 7.1  7.2  6.5   Total Bilirubin 0.0 - 1.2 mg/dL 0.7  0.6  0.6   Alkaline Phos 38 - 126 U/L 81  100  99   AST 15 - 41 U/L 19  18  20    ALT 0 - 44 U/L 9  9  10       RADIOGRAPHIC STUDIES: I have personally reviewed the radiological images as listed and agreed with the  findings in the report. No results found.  ASSESSMENT & PLAN Evan [name] is a 68 y.o. male who presents for continued management for metastatic colon cancer.   #Metastatic colon cancer involving the lungs and left adrenal gland --Oncologic history as above --Currently on FOLFIRI plus bevacizumab , started on 09/12/2023.  --Dose reduced infusional 5FU from 2400 mg/m2 to 1920 mg/m2 and irinotecan  from 180 mg/m2 to 144 mg/m2 on 10/29/2023 due to poor tolerance with diarrhea, weakness and decreased appetite.  PLAN: --Labs from today were reviewed and adequate for treatment. WBC 14.0 (secondary to Udenyca ), Hgb 11.6, Plt 176, creatinine 1.25, LFTs normal.  --Urine studies show proteinuria measuring 100 mg/dL. Proceed with bevacizumab  today but will check 24 hour urine today --Due to persistent mucositis not improved with magic mouthwash, will dose reduce infusional 5FU by another 10% to 1728 mg/m2. Remaining doses remain unchanged. --Due to rising CEA measuring 211 on 07/28/2024 increased from 191 on 07/14/2024, we will obtain CT CAP before next visit to evaluate treatment response.  --RTC in 2 weeks with labs and follow up with Dr. Davonna prior to next cycle of FOLFIRI/Bev.    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  Margins negative.  No lymphovascular or perineural invasion. - Received 3 cycles of XELOX followed by Xeloda for total of 6 months.  Oxaliplatin discontinued during cycle 4 secondary to transaminitis, elevated bilirubin and thrombocytopenia.  However he was also treated for hep C with Harvoni after that.   #Mucositis: --Currently on magic mouthwash TID with minimal improvement. Recommend to continue and refill sent.  --Will dose reduce 5FU chemo as well.   #Right neck muscle tightness: --Patient reports improper sleeping position causes the muscle tightness --Improved with flexeril  that was  prescribed two weeks ago, refill sent today --Will further assess with upcoming CT scan if there is another etiology  #Lower rib/epigastric pain: - Continue oxycodone  10 mg every 6 hours as needed.  Pain is well-controlled.   #Decreased appetite/weight loss: --Currently on megace  twice daily --Continued weight loss but contribute this to persistent mucositis  #Hypokalemia: --Potassium level is 3.5 today --Continue with potassium chloride  20 mEq daily  Orders Placed This Encounter  Procedures   CT CHEST ABDOMEN PELVIS W CONTRAST  Standing Status:   Future    Expected Date:   09/01/2024    Expiration Date:   08/18/2025    If indicated for the ordered procedure, I authorize the administration of contrast media per Radiology protocol:   Yes    Does the patient have a contrast media/X-ray dye allergy?:   No    Preferred imaging location?:   [facility]    If indicated for the ordered procedure, I authorize the administration of oral contrast media per Radiology protocol:   Yes   CEA    Standing Status:   Future    Expected Date:   08/18/2024    Expiration Date:   08/18/2025   Magnesium     Standing Status:   Future    Expected Date:   08/18/2024    Expiration Date:   08/18/2025   CBC with Differential    Standing Status:   Future    Expected Date:   08/18/2024    Expiration Date:   08/18/2025   Comprehensive metabolic panel    Standing Status:   Future    Expected Date:   08/18/2024    Expiration Date:   08/18/2025   Urinalysis, dipstick only    Standing Status:   Future    Expected Date:   08/18/2024    Expiration Date:   08/18/2025   CEA    Standing Status:   Future    Expected Date:   09/01/2024    Expiration Date:   09/01/2025   Magnesium     Standing Status:   Future    Expected Date:   09/01/2024    Expiration Date:   09/01/2025   CBC with Differential    Standing Status:   Future    Expected Date:   09/01/2024    Expiration Date:   09/01/2025   Comprehensive metabolic panel     Standing Status:   Future    Expected Date:   09/01/2024    Expiration Date:   09/01/2025   Urinalysis, dipstick only    Standing Status:   Future    Expected Date:   09/01/2024    Expiration Date:   09/01/2025   CEA    Standing Status:   Future    Expected Date:   09/15/2024    Expiration Date:   09/15/2025   Magnesium     Standing Status:   Future    Expected Date:   09/15/2024    Expiration Date:   09/15/2025   CBC with Differential    Standing Status:   Future    Expected Date:   09/15/2024    Expiration Date:   09/15/2025   Comprehensive metabolic panel    Standing Status:   Future    Expected Date:   09/15/2024    Expiration Date:   09/15/2025   Urinalysis, dipstick only    Standing Status:   Future    Expected Date:   09/15/2024    Expiration Date:   09/15/2025    All questions were answered. The patient knows to call the clinic with any problems, questions or concerns.  I have spent a total of 30 minutes minutes of face-to-face and non-face-to-face time, preparing to see the patient,  performing a medically appropriate examination, counseling and educating the patient, ordering medications/tests/procedures,  documenting clinical information in the electronic health record, independently interpreting results and communicating results to the patient, and care coordination.   Johnston Police, PA-C Department of Hematology/Oncology [facility]  [facility] Center at [facility]

## 2024-08-18 NOTE — Progress Notes (Signed)
 Patient presents today for MVASI /FOLFIRI infusions with 5FU pump start per providers order.  Vital signs and labs reviewed by PA.  Message received for Johnston Police PA, patient okay for treatment.  Per MD okay to treat with urine protein of 100.  24 hour urine was ordered per MD for urine protein base line, patient given the collection container and instructions.  Treatment given today per MD orders.  Stable during infusion without adverse affects.  Vital signs stable.  5FU pump connected and verified RUN on the screen with the patient.  No complaints at this time.  Discharge from clinic ambulatory in stable condition.  Alert and oriented X 3.  Follow up with [facility] as scheduled.

## 2024-08-19 ENCOUNTER — Other Ambulatory Visit: Payer: Self-pay

## 2024-08-20 ENCOUNTER — Inpatient Hospital Stay

## 2024-08-20 VITALS — BP 130/89 | HR 76 | Temp 97.9°F | Resp 16

## 2024-08-20 DIAGNOSIS — Z79899 Other long term (current) drug therapy: Secondary | ICD-10-CM | POA: Diagnosis not present

## 2024-08-20 DIAGNOSIS — E876 Hypokalemia: Secondary | ICD-10-CM | POA: Diagnosis not present

## 2024-08-20 DIAGNOSIS — C7801 Secondary malignant neoplasm of right lung: Secondary | ICD-10-CM | POA: Diagnosis not present

## 2024-08-20 DIAGNOSIS — C180 Malignant neoplasm of cecum: Secondary | ICD-10-CM

## 2024-08-20 DIAGNOSIS — Z5112 Encounter for antineoplastic immunotherapy: Secondary | ICD-10-CM | POA: Diagnosis not present

## 2024-08-20 DIAGNOSIS — I10 Essential (primary) hypertension: Secondary | ICD-10-CM | POA: Diagnosis not present

## 2024-08-20 DIAGNOSIS — Z5111 Encounter for antineoplastic chemotherapy: Secondary | ICD-10-CM | POA: Diagnosis not present

## 2024-08-20 DIAGNOSIS — Z5189 Encounter for other specified aftercare: Secondary | ICD-10-CM | POA: Diagnosis not present

## 2024-08-20 DIAGNOSIS — C7972 Secondary malignant neoplasm of left adrenal gland: Secondary | ICD-10-CM | POA: Diagnosis not present

## 2024-08-20 DIAGNOSIS — R071 Chest pain on breathing: Secondary | ICD-10-CM | POA: Diagnosis not present

## 2024-08-20 DIAGNOSIS — C7802 Secondary malignant neoplasm of left lung: Secondary | ICD-10-CM | POA: Diagnosis not present

## 2024-08-20 DIAGNOSIS — D696 Thrombocytopenia, unspecified: Secondary | ICD-10-CM | POA: Diagnosis not present

## 2024-08-20 DIAGNOSIS — Z95828 Presence of other vascular implants and grafts: Secondary | ICD-10-CM

## 2024-08-20 DIAGNOSIS — Z87891 Personal history of nicotine dependence: Secondary | ICD-10-CM | POA: Diagnosis not present

## 2024-08-20 MED ORDER — PEGFILGRASTIM-CBQV 6 MG/0.6ML ~~LOC~~ SOSY
6.0000 mg | PREFILLED_SYRINGE | Freq: Once | SUBCUTANEOUS | Status: AC
  Administered 2024-08-20: 6 mg via SUBCUTANEOUS
  Filled 2024-08-20: qty 0.6

## 2024-08-20 NOTE — Progress Notes (Signed)
 Evan [name] presents to have home infusion pump d/c'd and for port-a-cath deaccess with flush and also Udenyca  injection.   Portacath located left chest wall accessed with  H 20 needle.  Good blood return present. Portacath flushed with NS 20 mls and needle removed intact.  Procedure tolerated well and without incident.

## 2024-08-20 NOTE — Patient Instructions (Signed)
 [facility] [place] - A DEPT OF [facility]  Discharge Instructions: Thank you for choosing [facility] to provide your oncology and hematology care.  If you have a lab appointment with the Cancer Center - please note that after April 8th, 2024, all labs will be drawn in the cancer center.  You do not have to check in or register with the main entrance as you have in the past but will complete your check-in in the cancer center.  Wear comfortable clothing and clothing appropriate for easy access to any Portacath or PICC line.   We strive to give you quality time with your provider. You may need to reschedule your appointment if you arrive late (15 or more minutes).  Arriving late affects you and other patients whose appointments are after yours.  Also, if you miss three or more appointments without notifying the office, you may be dismissed from the clinic at the provider's discretion.      For prescription refill requests, have your pharmacy contact our office and allow 72 hours for refills to be completed.    Today you had your ambulatory pump disconnected per protocol and WBC injection : udenyca    To help prevent nausea and vomiting after your treatment, we encourage you to take your nausea medication as directed.  BELOW ARE SYMPTOMS THAT SHOULD BE REPORTED IMMEDIATELY: *FEVER GREATER THAN 100.4 F (38 C) OR HIGHER *CHILLS OR SWEATING *NAUSEA AND VOMITING THAT IS NOT CONTROLLED WITH YOUR NAUSEA MEDICATION *UNUSUAL SHORTNESS OF BREATH *UNUSUAL BRUISING OR BLEEDING *URINARY PROBLEMS (pain or burning when urinating, or frequent urination) *BOWEL PROBLEMS (unusual diarrhea, constipation, pain near the anus) TENDERNESS IN MOUTH AND THROAT WITH OR WITHOUT PRESENCE OF ULCERS (sore throat, sores in mouth, or a toothache) UNUSUAL RASH, SWELLING OR PAIN  UNUSUAL VAGINAL DISCHARGE OR ITCHING   Items with * indicate a potential emergency and should be followed  up as soon as possible or go to the Emergency Department if any problems should occur.  Please show the CHEMOTHERAPY ALERT CARD or IMMUNOTHERAPY ALERT CARD at check-in to the Emergency Department and triage nurse.  Should you have questions after your visit or need to cancel or reschedule your appointment, please contact [facility] [place] - A DEPT OF [name] [phone]  and follow the prompts.  Office hours are 8:00 a.m. to 4:30 p.m. Monday - Friday. Please note that voicemails left after 4:00 p.m. may not be returned until the following business day.  We are closed weekends and major holidays. You have access to a nurse at all times for urgent questions. Please call the main number to the clinic [phone] and follow the prompts.  For any non-urgent questions, you may also contact your provider using MyChart. We now offer e-Visits for anyone 35 and older to request care online for non-urgent symptoms. For details visit [email or web address].   Also download the MyChart app! Go to the app store, search MyChart, open the app, select [facility], and log in with your MyChart username and password.

## 2024-08-21 ENCOUNTER — Other Ambulatory Visit (HOSPITAL_COMMUNITY): Payer: Self-pay

## 2024-08-21 ENCOUNTER — Other Ambulatory Visit: Payer: Self-pay | Admitting: *Deleted

## 2024-08-21 DIAGNOSIS — C7801 Secondary malignant neoplasm of right lung: Secondary | ICD-10-CM | POA: Diagnosis not present

## 2024-08-21 DIAGNOSIS — Z5112 Encounter for antineoplastic immunotherapy: Secondary | ICD-10-CM | POA: Diagnosis not present

## 2024-08-21 DIAGNOSIS — C7972 Secondary malignant neoplasm of left adrenal gland: Secondary | ICD-10-CM | POA: Diagnosis not present

## 2024-08-21 DIAGNOSIS — C180 Malignant neoplasm of cecum: Secondary | ICD-10-CM

## 2024-08-21 DIAGNOSIS — E876 Hypokalemia: Secondary | ICD-10-CM | POA: Diagnosis not present

## 2024-08-21 DIAGNOSIS — Z95828 Presence of other vascular implants and grafts: Secondary | ICD-10-CM

## 2024-08-21 DIAGNOSIS — Z79899 Other long term (current) drug therapy: Secondary | ICD-10-CM | POA: Diagnosis not present

## 2024-08-21 DIAGNOSIS — I10 Essential (primary) hypertension: Secondary | ICD-10-CM | POA: Diagnosis not present

## 2024-08-21 DIAGNOSIS — R071 Chest pain on breathing: Secondary | ICD-10-CM | POA: Diagnosis not present

## 2024-08-21 DIAGNOSIS — D696 Thrombocytopenia, unspecified: Secondary | ICD-10-CM | POA: Diagnosis not present

## 2024-08-21 DIAGNOSIS — Z5111 Encounter for antineoplastic chemotherapy: Secondary | ICD-10-CM | POA: Diagnosis not present

## 2024-08-21 DIAGNOSIS — C7802 Secondary malignant neoplasm of left lung: Secondary | ICD-10-CM | POA: Diagnosis not present

## 2024-08-21 DIAGNOSIS — Z5189 Encounter for other specified aftercare: Secondary | ICD-10-CM | POA: Diagnosis not present

## 2024-08-21 DIAGNOSIS — Z87891 Personal history of nicotine dependence: Secondary | ICD-10-CM | POA: Diagnosis not present

## 2024-08-21 LAB — PROTEIN, URINE, 24 HOUR
Collection Interval-UPROT: 24 h
Protein, 24H Urine: 360 mg/d — ABNORMAL HIGH (ref 50–100)
Protein, Urine: 36 mg/dL
Urine Total Volume-UPROT: 1000 mL

## 2024-08-21 MED ORDER — LIDOCAINE-PRILOCAINE 2.5-2.5 % EX CREA: 1.0000 | TOPICAL_CREAM | CUTANEOUS | 0 refills | Status: AC | PRN

## 2024-08-21 MED ORDER — PROCHLORPERAZINE MALEATE 10 MG PO TABS: 10.0000 mg | ORAL_TABLET | Freq: Four times a day (QID) | ORAL | 2 refills | Status: AC | PRN

## 2024-08-27 ENCOUNTER — Ambulatory Visit (HOSPITAL_COMMUNITY)

## 2024-08-27 ENCOUNTER — Other Ambulatory Visit: Payer: Self-pay | Admitting: Oncology

## 2024-09-01 ENCOUNTER — Other Ambulatory Visit

## 2024-09-01 ENCOUNTER — Ambulatory Visit: Admitting: Physician Assistant

## 2024-09-01 ENCOUNTER — Ambulatory Visit

## 2024-09-02 ENCOUNTER — Encounter: Payer: Self-pay | Admitting: Oncology

## 2024-09-02 ENCOUNTER — Other Ambulatory Visit: Payer: Self-pay | Admitting: Oncology

## 2024-09-02 ENCOUNTER — Ambulatory Visit (HOSPITAL_COMMUNITY)
Admission: RE | Admit: 2024-09-02 | Discharge: 2024-09-02 | Disposition: A | Discharge status: Home / Self Care | Source: Ambulatory Visit | Attending: Oncology | Admitting: Oncology

## 2024-09-02 ENCOUNTER — Other Ambulatory Visit: Payer: Self-pay | Admitting: *Deleted

## 2024-09-02 ENCOUNTER — Inpatient Hospital Stay: Admitting: Oncology

## 2024-09-02 ENCOUNTER — Inpatient Hospital Stay

## 2024-09-02 VITALS — BP 122/82 | HR 71 | Temp 97.9°F | Resp 18

## 2024-09-02 DIAGNOSIS — C7802 Secondary malignant neoplasm of left lung: Secondary | ICD-10-CM | POA: Diagnosis not present

## 2024-09-02 DIAGNOSIS — R634 Abnormal weight loss: Secondary | ICD-10-CM

## 2024-09-02 DIAGNOSIS — R071 Chest pain on breathing: Secondary | ICD-10-CM | POA: Diagnosis not present

## 2024-09-02 DIAGNOSIS — I10 Essential (primary) hypertension: Secondary | ICD-10-CM | POA: Diagnosis not present

## 2024-09-02 DIAGNOSIS — E876 Hypokalemia: Secondary | ICD-10-CM | POA: Diagnosis not present

## 2024-09-02 DIAGNOSIS — C7972 Secondary malignant neoplasm of left adrenal gland: Secondary | ICD-10-CM | POA: Diagnosis not present

## 2024-09-02 DIAGNOSIS — D696 Thrombocytopenia, unspecified: Secondary | ICD-10-CM | POA: Diagnosis not present

## 2024-09-02 DIAGNOSIS — Z79899 Other long term (current) drug therapy: Secondary | ICD-10-CM | POA: Diagnosis not present

## 2024-09-02 DIAGNOSIS — C189 Malignant neoplasm of colon, unspecified: Secondary | ICD-10-CM | POA: Diagnosis not present

## 2024-09-02 DIAGNOSIS — Z5189 Encounter for other specified aftercare: Secondary | ICD-10-CM | POA: Diagnosis not present

## 2024-09-02 DIAGNOSIS — Z5112 Encounter for antineoplastic immunotherapy: Secondary | ICD-10-CM | POA: Diagnosis not present

## 2024-09-02 DIAGNOSIS — Z87891 Personal history of nicotine dependence: Secondary | ICD-10-CM | POA: Diagnosis not present

## 2024-09-02 DIAGNOSIS — C7801 Secondary malignant neoplasm of right lung: Secondary | ICD-10-CM | POA: Diagnosis not present

## 2024-09-02 DIAGNOSIS — C180 Malignant neoplasm of cecum: Secondary | ICD-10-CM

## 2024-09-02 DIAGNOSIS — Z5111 Encounter for antineoplastic chemotherapy: Secondary | ICD-10-CM | POA: Diagnosis not present

## 2024-09-02 DIAGNOSIS — Z95828 Presence of other vascular implants and grafts: Secondary | ICD-10-CM

## 2024-09-02 DIAGNOSIS — R918 Other nonspecific abnormal finding of lung field: Secondary | ICD-10-CM | POA: Diagnosis not present

## 2024-09-02 LAB — CBC WITH DIFFERENTIAL/PLATELET
Abs Immature Granulocytes: 0.55 K/uL — ABNORMAL HIGH (ref 0.00–0.07)
Basophils Absolute: 0.2 K/uL — ABNORMAL HIGH (ref 0.0–0.1)
Basophils Relative: 1 %
Eosinophils Absolute: 0.5 K/uL (ref 0.0–0.5)
Eosinophils Relative: 3 %
HCT: 34 % — ABNORMAL LOW (ref 39.0–52.0)
Hemoglobin: 11 g/dL — ABNORMAL LOW (ref 13.0–17.0)
Immature Granulocytes: 3 %
Lymphocytes Relative: 12 %
Lymphs Abs: 2.4 K/uL (ref 0.7–4.0)
MCH: 30.3 pg (ref 26.0–34.0)
MCHC: 32.4 g/dL (ref 30.0–36.0)
MCV: 93.7 fL (ref 80.0–100.0)
Monocytes Absolute: 1.6 K/uL — ABNORMAL HIGH (ref 0.1–1.0)
Monocytes Relative: 8 %
Neutro Abs: 14.9 K/uL — ABNORMAL HIGH (ref 1.7–7.7)
Neutrophils Relative %: 73 %
Platelets: 201 K/uL (ref 150–400)
RBC: 3.63 MIL/uL — ABNORMAL LOW (ref 4.22–5.81)
RDW: 16.9 % — ABNORMAL HIGH (ref 11.5–15.5)
WBC: 20.1 K/uL — ABNORMAL HIGH (ref 4.0–10.5)
nRBC: 0 % (ref 0.0–0.2)

## 2024-09-02 LAB — MAGNESIUM: Magnesium: 1.8 mg/dL (ref 1.7–2.4)

## 2024-09-02 LAB — URINALYSIS, DIPSTICK ONLY
Bilirubin Urine: NEGATIVE
Glucose, UA: NEGATIVE mg/dL
Ketones, ur: NEGATIVE mg/dL
Leukocytes,Ua: NEGATIVE
Nitrite: NEGATIVE
Protein, ur: 100 mg/dL — AB
Specific Gravity, Urine: 1.023 (ref 1.005–1.030)
pH: 5 (ref 5.0–8.0)

## 2024-09-02 LAB — COMPREHENSIVE METABOLIC PANEL WITH GFR
ALT: 12 U/L (ref 0–44)
AST: 23 U/L (ref 15–41)
Albumin: 3.1 g/dL — ABNORMAL LOW (ref 3.5–5.0)
Alkaline Phosphatase: 102 U/L (ref 38–126)
Anion gap: 12 (ref 5–15)
BUN: 14 mg/dL (ref 8–23)
CO2: 18 mmol/L — ABNORMAL LOW (ref 22–32)
Calcium: 8.2 mg/dL — ABNORMAL LOW (ref 8.9–10.3)
Chloride: 105 mmol/L (ref 98–111)
Creatinine, Ser: 0.9 mg/dL (ref 0.61–1.24)
GFR, Estimated: >60 mL/min (ref >60)
Glucose, Bld: 103 mg/dL — ABNORMAL HIGH (ref 70–99)
Potassium: 3.1 mmol/L — ABNORMAL LOW (ref 3.5–5.1)
Sodium: 135 mmol/L (ref 135–145)
Total Bilirubin: 0.5 mg/dL (ref 0.0–1.2)
Total Protein: 6.5 g/dL (ref 6.5–8.1)

## 2024-09-02 MED ORDER — DEXAMETHASONE SODIUM PHOSPHATE 10 MG/ML IJ SOLN
10.0000 mg | Freq: Once | INTRAMUSCULAR | Status: AC
  Administered 2024-09-02: 10 mg via INTRAVENOUS
  Filled 2024-09-02: qty 1

## 2024-09-02 MED ORDER — ATROPINE SULFATE 1 MG/ML IV SOLN
1.0000 mg | Freq: Once | INTRAVENOUS | Status: AC
  Administered 2024-09-02: 1 mg via INTRAVENOUS
  Filled 2024-09-02: qty 1

## 2024-09-02 MED ORDER — SODIUM CHLORIDE 0.9 % IV SOLN
5.0000 mg/kg | Freq: Once | INTRAVENOUS | Status: AC
  Administered 2024-09-02: 400 mg via INTRAVENOUS
  Filled 2024-09-02: qty 16

## 2024-09-02 MED ORDER — SODIUM CHLORIDE 0.9 % IV SOLN: Freq: Once | INTRAVENOUS | Status: AC

## 2024-09-02 MED ORDER — PALONOSETRON HCL INJECTION 0.25 MG/5ML
0.2500 mg | Freq: Once | INTRAVENOUS | Status: AC
  Administered 2024-09-02: 0.25 mg via INTRAVENOUS
  Filled 2024-09-02: qty 5

## 2024-09-02 MED ORDER — OXYCODONE HCL 5 MG PO TABS
5.0000 mg | ORAL_TABLET | Freq: Once | ORAL | Status: AC
  Administered 2024-09-02: 5 mg via ORAL
  Filled 2024-09-02: qty 1

## 2024-09-02 MED ORDER — SODIUM CHLORIDE 0.9 % IV SOLN
144.0000 mg/m2 | Freq: Once | INTRAVENOUS | Status: AC
  Administered 2024-09-02: 300 mg via INTRAVENOUS
  Filled 2024-09-02: qty 15

## 2024-09-02 MED ORDER — SODIUM CHLORIDE 0.9 % IV SOLN
400.0000 mg/m2 | Freq: Once | INTRAVENOUS | Status: AC
  Administered 2024-09-02: 780 mg via INTRAVENOUS
  Filled 2024-09-02: qty 39

## 2024-09-02 MED ORDER — LORAZEPAM 0.5 MG PO TABS: 0.5000 mg | ORAL_TABLET | Freq: Every day | ORAL | 2 refills | Status: AC

## 2024-09-02 MED ORDER — SODIUM CHLORIDE 0.9 % IV SOLN
150.0000 mg | Freq: Once | INTRAVENOUS | Status: AC
  Administered 2024-09-02: 150 mg via INTRAVENOUS
  Filled 2024-09-02: qty 150

## 2024-09-02 MED ORDER — OXYCODONE HCL 10 MG PO TABS: 10.0000 mg | ORAL_TABLET | ORAL | 0 refills | Status: DC | PRN

## 2024-09-02 MED ORDER — SODIUM CHLORIDE 0.9 % IV SOLN
3100.0000 mg | INTRAVENOUS | Status: DC
  Administered 2024-09-02: 3100 mg via INTRAVENOUS
  Filled 2024-09-02: qty 62

## 2024-09-02 MED ORDER — FLUOROURACIL CHEMO INJECTION 2.5 GM/50ML
320.0000 mg/m2 | Freq: Once | INTRAVENOUS | Status: AC
  Administered 2024-09-02: 600 mg via INTRAVENOUS
  Filled 2024-09-02: qty 12

## 2024-09-02 NOTE — Progress Notes (Signed)
 Maintain decreased dose of 5FU pump at a dose reduction of 1728 mg/m2 (rounding dose down to 3100 mg) for mucositis.

## 2024-09-02 NOTE — Progress Notes (Signed)
 Patient presents today for MVASI /Folfiri infusion and 5FU pump start per providers order.  Vital signs and labs within parameters for treatment.  Patient complaining of pain when he breaths in and out and that he is having to take shallower breaths due to the pain.  MD notified and patient will get a chest Xray today after treatment per providers order.  Nutrition consult was also put in for the patient due to a weight loss of 10 lbs since the beginning of August.  Per Dr. Davonna okay to proceed with treatment.  Treatment given today per MD orders.  Stable during infusion without adverse affects.  Vital signs stable.  5FU pump connected and verified RUN on the screen with the patient.  No complaints at this time.  Discharge from clinic ambulatory in stable condition.  Alert and oriented X 3.  Follow up with [facility] as scheduled.

## 2024-09-02 NOTE — Patient Instructions (Signed)
 [facility] [place] - A DEPT OF [facility]  Discharge Instructions: Thank you for choosing [facility] to provide your oncology and hematology care.  If you have a lab appointment with the Cancer Center - please note that after April 8th, 2024, all labs will be drawn in the cancer center.  You do not have to check in or register with the main entrance as you have in the past but will complete your check-in in the cancer center.  Wear comfortable clothing and clothing appropriate for easy access to any Portacath or PICC line.   We strive to give you quality time with your provider. You may need to reschedule your appointment if you arrive late (15 or more minutes).  Arriving late affects you and other patients whose appointments are after yours.  Also, if you miss three or more appointments without notifying the office, you may be dismissed from the clinic at the provider's discretion.      For prescription refill requests, have your pharmacy contact our office and allow 72 hours for refills to be completed.    Today you received the following chemotherapy and/or immunotherapy agents MVASI /Folfiri 5FU pump start      To help prevent nausea and vomiting after your treatment, we encourage you to take your nausea medication as directed.  BELOW ARE SYMPTOMS THAT SHOULD BE REPORTED IMMEDIATELY: *FEVER GREATER THAN 100.4 F (38 C) OR HIGHER *CHILLS OR SWEATING *NAUSEA AND VOMITING THAT IS NOT CONTROLLED WITH YOUR NAUSEA MEDICATION *UNUSUAL SHORTNESS OF BREATH *UNUSUAL BRUISING OR BLEEDING *URINARY PROBLEMS (pain or burning when urinating, or frequent urination) *BOWEL PROBLEMS (unusual diarrhea, constipation, pain near the anus) TENDERNESS IN MOUTH AND THROAT WITH OR WITHOUT PRESENCE OF ULCERS (sore throat, sores in mouth, or a toothache) UNUSUAL RASH, SWELLING OR PAIN  UNUSUAL VAGINAL DISCHARGE OR ITCHING   Items with * indicate a potential emergency and  should be followed up as soon as possible or go to the Emergency Department if any problems should occur.  Please show the CHEMOTHERAPY ALERT CARD or IMMUNOTHERAPY ALERT CARD at check-in to the Emergency Department and triage nurse.  Should you have questions after your visit or need to cancel or reschedule your appointment, please contact [facility] [place] - A DEPT OF [name] [phone]  and follow the prompts.  Office hours are 8:00 a.m. to 4:30 p.m. Monday - Friday. Please note that voicemails left after 4:00 p.m. may not be returned until the following business day.  We are closed weekends and major holidays. You have access to a nurse at all times for urgent questions. Please call the main number to the clinic [phone] and follow the prompts.  For any non-urgent questions, you may also contact your provider using MyChart. We now offer e-Visits for anyone 32 and older to request care online for non-urgent symptoms. For details visit [email or web address].   Also download the MyChart app! Go to the app store, search MyChart, open the app, select , and log in with your MyChart username and password.

## 2024-09-03 ENCOUNTER — Encounter

## 2024-09-03 LAB — CEA: CEA: 195 ng/mL — ABNORMAL HIGH (ref 0.0–4.7)

## 2024-09-04 ENCOUNTER — Inpatient Hospital Stay

## 2024-09-04 VITALS — BP 161/97 | HR 97 | Temp 98.1°F | Resp 19

## 2024-09-04 DIAGNOSIS — C180 Malignant neoplasm of cecum: Secondary | ICD-10-CM | POA: Diagnosis not present

## 2024-09-04 DIAGNOSIS — Z5112 Encounter for antineoplastic immunotherapy: Secondary | ICD-10-CM | POA: Diagnosis not present

## 2024-09-04 DIAGNOSIS — Z5189 Encounter for other specified aftercare: Secondary | ICD-10-CM | POA: Diagnosis not present

## 2024-09-04 DIAGNOSIS — Z79899 Other long term (current) drug therapy: Secondary | ICD-10-CM | POA: Diagnosis not present

## 2024-09-04 DIAGNOSIS — Z95828 Presence of other vascular implants and grafts: Secondary | ICD-10-CM

## 2024-09-04 DIAGNOSIS — I10 Essential (primary) hypertension: Secondary | ICD-10-CM | POA: Diagnosis not present

## 2024-09-04 DIAGNOSIS — Z5111 Encounter for antineoplastic chemotherapy: Secondary | ICD-10-CM | POA: Diagnosis not present

## 2024-09-04 DIAGNOSIS — C7972 Secondary malignant neoplasm of left adrenal gland: Secondary | ICD-10-CM | POA: Diagnosis not present

## 2024-09-04 DIAGNOSIS — C7802 Secondary malignant neoplasm of left lung: Secondary | ICD-10-CM | POA: Diagnosis not present

## 2024-09-04 DIAGNOSIS — C7801 Secondary malignant neoplasm of right lung: Secondary | ICD-10-CM | POA: Diagnosis not present

## 2024-09-04 DIAGNOSIS — D696 Thrombocytopenia, unspecified: Secondary | ICD-10-CM | POA: Diagnosis not present

## 2024-09-04 DIAGNOSIS — R071 Chest pain on breathing: Secondary | ICD-10-CM | POA: Diagnosis not present

## 2024-09-04 DIAGNOSIS — Z87891 Personal history of nicotine dependence: Secondary | ICD-10-CM | POA: Diagnosis not present

## 2024-09-04 DIAGNOSIS — E876 Hypokalemia: Secondary | ICD-10-CM | POA: Diagnosis not present

## 2024-09-04 MED ORDER — PEGFILGRASTIM-CBQV 6 MG/0.6ML ~~LOC~~ SOSY
6.0000 mg | PREFILLED_SYRINGE | Freq: Once | SUBCUTANEOUS | Status: AC
  Administered 2024-09-04: 6 mg via SUBCUTANEOUS

## 2024-09-04 NOTE — Progress Notes (Signed)
 Patient presents today for pump d/c. Vital signs are stable. Port a cath site clean, dry, and intact. Port flushed with 20 mls of Normal Saline. Needle removed intact. Band aid applied. Patient has no complaints at this time.    Patient tolerated Udenyca  injection with no complaints voiced.  Site clean and dry with no bruising or swelling noted at site.  See MAR for details.  Band aid applied.  Patient stable during and after injection.  Vss with discharge and left in satisfactory condition with no s/s of distress noted. All follow ups as scheduled.   Sahira Cataldi

## 2024-09-08 ENCOUNTER — Inpatient Hospital Stay: Admitting: Dietician

## 2024-09-08 ENCOUNTER — Ambulatory Visit (HOSPITAL_COMMUNITY)
Admission: RE | Admit: 2024-09-08 | Discharge: 2024-09-08 | Disposition: A | Discharge status: Home / Self Care | Source: Ambulatory Visit | Attending: Physician Assistant | Admitting: Physician Assistant

## 2024-09-08 ENCOUNTER — Telehealth: Payer: Self-pay | Admitting: Dietician

## 2024-09-08 DIAGNOSIS — C180 Malignant neoplasm of cecum: Secondary | ICD-10-CM | POA: Diagnosis not present

## 2024-09-08 DIAGNOSIS — C7802 Secondary malignant neoplasm of left lung: Secondary | ICD-10-CM | POA: Diagnosis not present

## 2024-09-08 DIAGNOSIS — C7801 Secondary malignant neoplasm of right lung: Secondary | ICD-10-CM | POA: Diagnosis not present

## 2024-09-08 DIAGNOSIS — K7460 Unspecified cirrhosis of liver: Secondary | ICD-10-CM | POA: Diagnosis not present

## 2024-09-08 MED ORDER — IOHEXOL 9 MG/ML PO SOLN
500.0000 mL | ORAL | Status: AC
Start: 2024-09-08 — End: 2024-09-08
  Administered 2024-09-08: 1000 mL via ORAL

## 2024-09-08 MED ORDER — IOHEXOL 300 MG/ML  SOLN
100.0000 mL | Freq: Once | INTRAMUSCULAR | Status: AC | PRN
  Administered 2024-09-08: 100 mL via INTRAVENOUS

## 2024-09-08 NOTE — Telephone Encounter (Signed)
 Briefly spoke with patient via telephone for nutrition assessment. Patient reports currently downstairs for CT scan. He is unable to talk with RD at this time.   Chart reviewed. Will reschedule appointment for Monday, October 6 during infusion.

## 2024-09-10 ENCOUNTER — Other Ambulatory Visit: Payer: Self-pay | Admitting: Oncology

## 2024-09-10 DIAGNOSIS — C180 Malignant neoplasm of cecum: Secondary | ICD-10-CM

## 2024-09-10 DIAGNOSIS — Z95828 Presence of other vascular implants and grafts: Secondary | ICD-10-CM

## 2024-09-12 ENCOUNTER — Other Ambulatory Visit: Payer: Self-pay

## 2024-09-15 ENCOUNTER — Inpatient Hospital Stay

## 2024-09-15 ENCOUNTER — Encounter: Payer: Self-pay | Admitting: Oncology

## 2024-09-15 ENCOUNTER — Inpatient Hospital Stay: Attending: Hematology | Admitting: Oncology

## 2024-09-15 ENCOUNTER — Inpatient Hospital Stay: Admitting: Dietician

## 2024-09-15 ENCOUNTER — Inpatient Hospital Stay: Attending: Hematology

## 2024-09-15 VITALS — Wt 174.0 lb

## 2024-09-15 VITALS — BP 149/86 | HR 80 | Temp 97.5°F | Resp 18

## 2024-09-15 DIAGNOSIS — Z95828 Presence of other vascular implants and grafts: Secondary | ICD-10-CM

## 2024-09-15 DIAGNOSIS — E876 Hypokalemia: Secondary | ICD-10-CM | POA: Insufficient documentation

## 2024-09-15 DIAGNOSIS — K1230 Oral mucositis (ulcerative), unspecified: Secondary | ICD-10-CM

## 2024-09-15 DIAGNOSIS — C180 Malignant neoplasm of cecum: Secondary | ICD-10-CM

## 2024-09-15 DIAGNOSIS — C7801 Secondary malignant neoplasm of right lung: Secondary | ICD-10-CM | POA: Diagnosis not present

## 2024-09-15 DIAGNOSIS — Z5112 Encounter for antineoplastic immunotherapy: Secondary | ICD-10-CM | POA: Diagnosis present

## 2024-09-15 DIAGNOSIS — F17210 Nicotine dependence, cigarettes, uncomplicated: Secondary | ICD-10-CM | POA: Insufficient documentation

## 2024-09-15 DIAGNOSIS — C782 Secondary malignant neoplasm of pleura: Secondary | ICD-10-CM | POA: Diagnosis not present

## 2024-09-15 DIAGNOSIS — E278 Other specified disorders of adrenal gland: Secondary | ICD-10-CM | POA: Insufficient documentation

## 2024-09-15 DIAGNOSIS — C7972 Secondary malignant neoplasm of left adrenal gland: Secondary | ICD-10-CM | POA: Diagnosis not present

## 2024-09-15 DIAGNOSIS — Z5189 Encounter for other specified aftercare: Secondary | ICD-10-CM | POA: Diagnosis not present

## 2024-09-15 DIAGNOSIS — D649 Anemia, unspecified: Secondary | ICD-10-CM | POA: Diagnosis not present

## 2024-09-15 DIAGNOSIS — C7802 Secondary malignant neoplasm of left lung: Secondary | ICD-10-CM | POA: Insufficient documentation

## 2024-09-15 DIAGNOSIS — R197 Diarrhea, unspecified: Secondary | ICD-10-CM | POA: Diagnosis not present

## 2024-09-15 DIAGNOSIS — Z79899 Other long term (current) drug therapy: Secondary | ICD-10-CM | POA: Insufficient documentation

## 2024-09-15 DIAGNOSIS — C7971 Secondary malignant neoplasm of right adrenal gland: Secondary | ICD-10-CM | POA: Insufficient documentation

## 2024-09-15 DIAGNOSIS — Z5111 Encounter for antineoplastic chemotherapy: Secondary | ICD-10-CM | POA: Insufficient documentation

## 2024-09-15 DIAGNOSIS — C189 Malignant neoplasm of colon, unspecified: Secondary | ICD-10-CM

## 2024-09-15 LAB — URINALYSIS, DIPSTICK ONLY
Bilirubin Urine: NEGATIVE
Glucose, UA: NEGATIVE mg/dL
Ketones, ur: NEGATIVE mg/dL
Leukocytes,Ua: NEGATIVE
Nitrite: NEGATIVE
Protein, ur: 100 mg/dL — AB
Specific Gravity, Urine: 1.013 (ref 1.005–1.030)
pH: 5 (ref 5.0–8.0)

## 2024-09-15 LAB — CBC WITH DIFFERENTIAL/PLATELET
Abs Immature Granulocytes: 0.8 K/uL — ABNORMAL HIGH (ref 0.00–0.07)
Band Neutrophils: 2 %
Basophils Absolute: 0 K/uL (ref 0.0–0.1)
Basophils Relative: 0 %
Eosinophils Absolute: 0.3 K/uL (ref 0.0–0.5)
Eosinophils Relative: 2 %
HCT: 33.7 % — ABNORMAL LOW (ref 39.0–52.0)
Hemoglobin: 10.9 g/dL — ABNORMAL LOW (ref 13.0–17.0)
Lymphocytes Relative: 18 %
Lymphs Abs: 2.9 K/uL (ref 0.7–4.0)
MCH: 30.7 pg (ref 26.0–34.0)
MCHC: 32.3 g/dL (ref 30.0–36.0)
MCV: 94.9 fL (ref 80.0–100.0)
Metamyelocytes Relative: 4 %
Monocytes Absolute: 2.3 K/uL — ABNORMAL HIGH (ref 0.1–1.0)
Monocytes Relative: 14 %
Myelocytes: 1 %
Neutro Abs: 9.8 K/uL — ABNORMAL HIGH (ref 1.7–7.7)
Neutrophils Relative %: 59 %
Platelets: 193 K/uL (ref 150–400)
RBC: 3.55 MIL/uL — ABNORMAL LOW (ref 4.22–5.81)
RDW: 17.2 % — ABNORMAL HIGH (ref 11.5–15.5)
Smear Review: NORMAL
WBC: 16.1 K/uL — ABNORMAL HIGH (ref 4.0–10.5)
nRBC: 0 % (ref 0.0–0.2)

## 2024-09-15 LAB — COMPREHENSIVE METABOLIC PANEL WITH GFR
ALT: 9 U/L (ref 0–44)
AST: 25 U/L (ref 15–41)
Albumin: 3.5 g/dL (ref 3.5–5.0)
Alkaline Phosphatase: 117 U/L (ref 38–126)
Anion gap: 12 (ref 5–15)
BUN: 6 mg/dL — ABNORMAL LOW (ref 8–23)
CO2: 19 mmol/L — ABNORMAL LOW (ref 22–32)
Calcium: 8.6 mg/dL — ABNORMAL LOW (ref 8.9–10.3)
Chloride: 106 mmol/L (ref 98–111)
Creatinine, Ser: 0.9 mg/dL (ref 0.61–1.24)
GFR, Estimated: >60 mL/min (ref >60)
Glucose, Bld: 135 mg/dL — ABNORMAL HIGH (ref 70–99)
Potassium: 3.3 mmol/L — ABNORMAL LOW (ref 3.5–5.1)
Sodium: 137 mmol/L (ref 135–145)
Total Bilirubin: 0.3 mg/dL (ref 0.0–1.2)
Total Protein: 6.4 g/dL — ABNORMAL LOW (ref 6.5–8.1)

## 2024-09-15 LAB — MAGNESIUM: Magnesium: 1.8 mg/dL (ref 1.7–2.4)

## 2024-09-15 MED ORDER — SODIUM CHLORIDE 0.9 % IV SOLN
150.0000 mg | Freq: Once | INTRAVENOUS | Status: AC
  Administered 2024-09-15: 150 mg via INTRAVENOUS
  Filled 2024-09-15: qty 150

## 2024-09-15 MED ORDER — PALONOSETRON HCL INJECTION 0.25 MG/5ML
0.2500 mg | Freq: Once | INTRAVENOUS | Status: AC
  Administered 2024-09-15: 0.25 mg via INTRAVENOUS
  Filled 2024-09-15: qty 5

## 2024-09-15 MED ORDER — POTASSIUM CHLORIDE CRYS ER 20 MEQ PO TBCR
40.0000 meq | EXTENDED_RELEASE_TABLET | Freq: Once | ORAL | Status: AC
  Administered 2024-09-15: 40 meq via ORAL
  Filled 2024-09-15: qty 2

## 2024-09-15 MED ORDER — MELATONIN 5 MG PO CAPS: 5.0000 mg | ORAL_CAPSULE | Freq: Every day | ORAL | 3 refills | Status: AC

## 2024-09-15 MED ORDER — SODIUM CHLORIDE 0.9 % IV SOLN
144.0000 mg/m2 | Freq: Once | INTRAVENOUS | Status: AC
  Administered 2024-09-15: 300 mg via INTRAVENOUS
  Filled 2024-09-15: qty 15

## 2024-09-15 MED ORDER — ATROPINE SULFATE 1 MG/ML IV SOLN
1.0000 mg | Freq: Once | INTRAVENOUS | Status: AC | PRN
  Administered 2024-09-15: 1 mg via INTRAVENOUS
  Filled 2024-09-15: qty 1

## 2024-09-15 MED ORDER — SODIUM CHLORIDE 0.9 % IV SOLN: Freq: Once | INTRAVENOUS | Status: AC

## 2024-09-15 MED ORDER — SODIUM CHLORIDE 0.9 % IV SOLN
400.0000 mg/m2 | Freq: Once | INTRAVENOUS | Status: AC
  Administered 2024-09-15: 780 mg via INTRAVENOUS
  Filled 2024-09-15: qty 39

## 2024-09-15 MED ORDER — SODIUM CHLORIDE 0.9 % IV SOLN
3100.0000 mg | INTRAVENOUS | Status: DC
  Administered 2024-09-15: 3100 mg via INTRAVENOUS
  Filled 2024-09-15: qty 62

## 2024-09-15 MED ORDER — DEXAMETHASONE SODIUM PHOSPHATE 10 MG/ML IJ SOLN
10.0000 mg | Freq: Once | INTRAMUSCULAR | Status: AC
  Administered 2024-09-15: 10 mg via INTRAVENOUS
  Filled 2024-09-15: qty 1

## 2024-09-15 MED ORDER — FLUOROURACIL CHEMO INJECTION 2.5 GM/50ML
320.0000 mg/m2 | Freq: Once | INTRAVENOUS | Status: AC
  Administered 2024-09-15: 600 mg via INTRAVENOUS
  Filled 2024-09-15: qty 12

## 2024-09-15 MED ORDER — SODIUM CHLORIDE 0.9 % IV SOLN
5.0000 mg/kg | Freq: Once | INTRAVENOUS | Status: AC
  Administered 2024-09-15: 400 mg via INTRAVENOUS
  Filled 2024-09-15: qty 16

## 2024-09-15 NOTE — Progress Notes (Signed)
 Patient has been examined by Dr. Davonna. Vital signs and labs have been reviewed by MD - ANC, Creatinine, LFTs, hemoglobin, and platelets have been reviewed by M.D. - pt may proceed with treatment.  Primary RN and pharmacy notified.

## 2024-09-15 NOTE — Progress Notes (Signed)
 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 male was transferred to my care after his prior physician has left.   ASSESSMENT & PLAN:   Assessment & Plan: Evan [name]  is a 68 y.o. 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 Extensive oncology history below. Patient currently on FOLFIRI and bevacizumab (1st line) and tolerating well.  5-FU and irinotecan  dose reduced for mucositis.  -We reviewed the recent CT scan findings together.  There is slight increase in the supraclavicular lymph node and one of the lung nodules but stable at all the other sites.  There are no new lesions. - Considering CEA trending down a little bit, will continue the same treatment for now - If CEA continues to trend up, will consider imaging sooner in 6- 8 weeks - Patient did not receive oxaliplatin before.  Will consider FOLFOX plus bevacizumab  on progression. - Labs reviewed today:CMP: Potassium: 3.3, normal creatinine, normal LFTs, CBC: WBC: 16.1, hemoglobin: 10.9, platelets: 193.  UA: Protein: 2+ blood 24-hour urine protein previously was 360 mg. - Physical exam stable today.  Proceed with chemotherapy today. - Continue chemotherapy every 2 weeks.  Return to clinic in 4 weeks for follow-up.  Diarrhea secondary to cancer treatment Recurrent diarrhea managed with Imodium .  - Advised to continue use of Imodium  as needed for diarrhea.  Insomnia Chronic insomnia. Ativan  ineffective. Screen time and late meals possible factors.  - Advise no TV or phone use before bed. - Recommend dinner at least one hour before bed. -  Prescribed melatonin 5 mg at 7 PM daily. - Continue Ativan  0.5 mg nightly - Reassess sleep quality in one month.  Chronic obstructive pulmonary disease (COPD) Mild wheezing likely due to smoking. No inhaler use.  - Advise smoking cessation to improve respiratory symptoms.  Nicotine dependence, current smoker Resumed smoking 1-2 cigarettes per day after quitting.  - Advise smoking cessation.  Mucositis Improved after dose reducing chemotherapy  -Continue to use Magic mouthwash as needed  Anemia Likely multifactorial-iron deficiency, anemia of chronic disease, immunosuppression from chemotherapy  - Will obtain anemia panel with next blood draw  Hypokalemia Will replace per protocol   The patient understands the plans discussed today and is in agreement with them.  He knows to contact our office if he develops concerns prior to his next appointment.  50 minutes of total time was spent for this patient encounter, including preparation,review of records,  face-to-face counseling with the patient and coordination of care, physical exam, and documentation of the encounter.   [name], MD  [facility] [place] - A DEPT OF [name] [address] [place] [address] Dept: [phone] Dept Fax: [phone]   Orders Placed This Encounter  Procedures   Ferritin    Standing Status:   Future    Expected Date:   09/29/2024    Expiration Date:   12/28/2024   Folate    Standing Status:   Future    Expected Date:   09/29/2024    Expiration Date:   12/28/2024   Vitamin B12    Standing Status:  Future    Expected Date:   09/29/2024    Expiration Date:   12/28/2024   Iron and TIBC    Standing Status:   Future    Expected Date:   09/29/2024    Expiration Date:   12/28/2024     ONCOLOGY HISTORY:   I have reviewed his chart and materials related to his cancer extensively and collaborated history with the patient.  Summary of oncologic history is as follows:   Metastatic colon cancer to lungs and adrenal gland  -Presentation: Dry cough for 6 months, 30 pound weight loss in 2 years -12/16/2021: CT chest with contrast: Bulky left hilar mass centered about the superior segment left lower lobe, measuring 8.1 x 7.5 cm. Numerous bilateral pulmonary masses and nodules of varying sizes. Numerous bulky mediastinal and bilateral hilar lymph nodes. New left adrenal nodule. -12/26/2021: CEA: 15.6 -01/03/2022: CT abdomen and pelvis: Isolated left adrenal metastasis, as on chest CT. No other evidence of metastatic disease within the abdomen or pelvis. -01/03/2022: MRI brain: No evidence of intracranial metastatic disease. -01/17/2022: Left lung core needle biopsy: Adenocarcinoma with associated necrosis  -By immunohistochemistry, the neoplastic cells are positive for CK20 and CDX2 but negative for cytokeratin 7.   -The morphology and immunophenotype are consistent with a primary colonic adenocarcinoma.  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  -09/12/2023: Added bevacizumab  secondary to progression  -5-FU and irinotecan  dose reduced for mucositis -05/17/2022: CT CAP: Mediastinal and hilar lymph nodes have decreased in size. Largest perihilar left lower lobe lung mass has decreased in size. Right upper lobe mass slightly increased in size. Other nodules are stable. Left adrenal mass decreased to 1.3 cm from 2.8 cm.  -07/2022-01/2023: Stable findings on CT CAP -09/05/2023: CT CAP: Increased size of dominant left lower lobe pulmonary nodule with new central occlusion of the superior left upper lobe segmental  bronchus. Additional bilateral irregular solid pulmonary nodules are stable or slightly increased in size. Increased size of right hilar lymph node. Stable pleural metastatic disease. Stable left adrenal gland nodule. - 11/07/2022: CEA: 62.6 - 10/29/2023: CEA: 159 - 12/2023-06/2024: Stable findings on CT CAP -07/28/2024: CEA: 211 - 09/02/2024: CEA: 195 - 09/08/2024: CT CAP: Slight interval increase in size of bilateral metastatic pulmonary nodules and masses (12 to 16 mm). Increased size of a subcarinal lymph node(9 mm to 14 mm) with similar size of the additional supraclavicular, mediastinal and hilar lymph nodes. Stable size of the left adrenal metastasis.   Current Treatment:  FOLFIRI + Bevacizumab   INTERVAL HISTORY:  Discussed the use of AI scribe software for clinical note transcription with the patient, who gave verbal consent to proceed.  History of Present Illness Evan [name] is a 68 year old male with metastatic colon cancer presenting today to establish care with me and for follow-up.  He experiences difficulty sleeping, characterized by tossing and turning throughout the night. Previously prescribed Ativan  has not been effective. He engages in watching TV and using his phone before bed but denies other activities when unable to sleep.  He has a history of colon cancer and recently underwent imaging. His cancer marker levels, previously elevated, have decreased.  He frequently experiences diarrhea, particularly following treatments, and uses Imodium   for relief. Mouth sores have improved and are no longer present. No nausea, vomiting, numbness, or tingling in his hands and feet. Neck pain, previously present, has improved with Flexeril .  He has a history of smoking, having quit and then restarted about two weeks ago, currently smoking one to two cigarettes a day. He does not use inhalers.   I have reviewed the past medical history, past surgical history, social history and  family history with the patient and they are unchanged from previous note.  ALLERGIES:  has no known allergies.  MEDICATIONS:  Current Outpatient Medications  Medication Sig Dispense Refill   aluminum -magnesium  hydroxide-simethicone  (MAALOX) 200-200-20 MG/5ML SUSP Take 30 mLs by mouth 4 (four) times daily -  before meals and at bedtime. 1680 mL 2   amLODipine  (NORVASC ) 10 MG tablet Take 1 tablet (10 mg total) by mouth daily. 90 tablet 3   Bevacizumab  (AVASTIN  IV) Inject into the vein every 14 (fourteen) days. *start date TBD     cyclobenzaprine  (FLEXERIL ) 10 MG tablet Take 1 tablet (10 mg total) by mouth at bedtime. 5 tablet 0   dexamethasone  (DECADRON ) 0.5 MG/5ML solution 10ml swish and spit twice daily for 5 days after each chemo treatment 240 mL 3   ENULOSE  10 GM/15ML SOLN Take 10 g by mouth daily as needed.     fluorouracil  CALGB 19297 2,400 mg/m2 in sodium chloride  0.9 % 150 mL Inject 2,400 mg/m2 into the vein over 48 hr.     FLUOROURACIL  IV Inject into the vein every 14 (fourteen) days.     Lactulose  20 GM/30ML SOLN Take 15 mLs (10 g total) by mouth at bedtime. Take 15 ml at bedtime every night to assist with regular bowel movements.  Titrate down if having multiple bowel movements.  If a bowel movement has not occurred in 3 to 4 days or longer, then take 15 ml every 3 hours until a bowel movent has occurred. 450 mL 5   LEUCOVORIN  CALCIUM  IV Inject into the vein every 14 (fourteen) days.     lidocaine -prilocaine  (EMLA ) cream Apply 1 Application topically as needed (Apply to port site 30 mins- 1 hour prior to treatment/labs.). 30 g 0   lisinopril  (ZESTRIL ) 10 MG tablet TAKE ONE TABLET BY MOUTH DAILY 30 tablet 3   loperamide  (IMODIUM ) 2 MG capsule TAKE 2 CAPSULES BY MOUTH AFTER FIRST LOOSE STOOL,AND THEN 1 CAPSULE AFTER 2ND LOOSE STOOL. DO NOT EXCEED 8 CAPS IN 24 HOURS 30 capsule 0   LORazepam  (ATIVAN ) 0.5 MG tablet Take 1 tablet (0.5 mg total) by mouth at bedtime. 30 tablet 2   magic  mouthwash w/lidocaine  SOLN Take 5 mLs by mouth 4 (four) times daily as needed for mouth pain. 250 mL 1   megestrol  (MEGACE ) 400 MG/10ML suspension Take 10 mLs (400 mg total) by mouth 2 (two) times daily. 480 mL 3   Melatonin 5 MG CAPS Take 1 capsule (5 mg total) by mouth at bedtime. Take one capsule by mouth every evening at 7 pm 30 capsule 3   Misc. Devices MISC Please provide patient with 1:1 ratio of magic mouthwash and lidocaine  to swish and swallow QID 1 each 3   naloxone  (NARCAN ) nasal spray 4 mg/0.1 mL 0.1ml nasal spray in one nostril. May repeat the dose in other nostril in 2-3 minutes if needed. 2 each 3   Oxycodone  HCl 10 MG TABS Take 1 tablet (10 mg total) by mouth every 4 (four) hours as needed. 180 tablet 0  pantoprazole  (PROTONIX ) 40 MG tablet Take 1 tablet (40 mg total) by mouth daily. 30 tablet 11   potassium chloride  SA (KLOR-CON  M) 20 MEQ tablet Take 1 tablet (20 mEq total) by mouth daily. 90 tablet 4   prochlorperazine  (COMPAZINE ) 10 MG tablet Take 1 tablet (10 mg total) by mouth every 6 (six) hours as needed (NAUSEA). 60 tablet 2   No current facility-administered medications for this visit.   Facility-Administered Medications Ordered in Other Visits  Medication Dose Route Frequency Provider Last Rate Last Admin   potassium chloride  SA (KLOR-CON  M) CR tablet 40 mEq  40 mEq Oral Once [name], MD        REVIEW OF SYSTEMS:   Constitutional: Denies fevers, chills or abnormal weight loss Eyes: Denies blurriness of vision Ears, nose, mouth, throat, and face: Denies mucositis or sore throat Respiratory: Denies cough, dyspnea or wheezes Cardiovascular: Denies palpitation, chest discomfort or lower extremity swelling Gastrointestinal:  Denies nausea, heartburn or change in bowel habits Skin: Denies abnormal skin rashes Lymphatics: Denies new lymphadenopathy or easy bruising Neurological:Denies numbness, tingling or new weaknesses Behavioral/Psych: Mood is stable,  no new changes  All other systems were reviewed with the patient and are negative.   VITALS:  Weight 174 lb (78.9 kg).  Wt Readings from Last 3 Encounters:  09/15/24 174 lb (78.9 kg)  09/02/24 174 lb 2.6 oz (79 kg)  08/18/24 176 lb 2.4 oz (79.9 kg)    Body mass index is 24.97 kg/m.  Performance status (ECOG): 1 - Symptomatic but completely ambulatory  PHYSICAL EXAM:   GENERAL:alert, no distress and comfortable SKIN: skin color, texture, turgor are normal, no rashes or significant lesions LYMPH:  no palpable lymphadenopathy in the cervical, axillary or inguinal LUNGS: clear to auscultation and percussion with normal breathing effort HEART: regular rate & rhythm and no murmurs and no lower extremity edema ABDOMEN:abdomen soft, non-tender and normal bowel sounds Musculoskeletal:no cyanosis of digits and no clubbing  NEURO: alert & oriented x 3 with fluent speech, no focal motor/sensory deficits  LABORATORY DATA:  I have reviewed the data as listed   Lab Results  Component Value Date   WBC 16.1 (H) 09/15/2024   NEUTROABS 9.8 (H) 09/15/2024   HGB 10.9 (L) 09/15/2024   HCT 33.7 (L) 09/15/2024   MCV 94.9 09/15/2024   PLT 193 09/15/2024      Chemistry      Component Value Date/Time   NA 137 09/15/2024 0924   K 3.3 (L) 09/15/2024 0924   CL 106 09/15/2024 0924   CO2 19 (L) 09/15/2024 0924   BUN 6 (L) 09/15/2024 0924   CREATININE 0.90 09/15/2024 0924      Component Value Date/Time   CALCIUM  8.6 (L) 09/15/2024 0924   ALKPHOS 117 09/15/2024 0924   AST 25 09/15/2024 0924   ALT 9 09/15/2024 0924   BILITOT 0.3 09/15/2024 0924        Latest Reference Range & Units 09/15/24 09:24  Appearance CLEAR  CLEAR  Bilirubin Urine NEGATIVE  NEGATIVE  Color, Urine YELLOW  YELLOW  Glucose, UA NEGATIVE mg/dL NEGATIVE  Hgb urine dipstick NEGATIVE  SMALL !  Ketones, ur NEGATIVE mg/dL NEGATIVE  Leukocytes,Ua NEGATIVE  NEGATIVE  Nitrite NEGATIVE  NEGATIVE  pH 5.0 - 8.0  5.0   Protein NEGATIVE mg/dL 899 !  Specific Gravity, Urine 1.005 - 1.030  1.013  !: Data is abnormal   Latest Reference Range & Units 08/21/24 10:31  Urine Total Volume-UPROT mL 1,000  Collection Interval-UPROT hours 24  Protein, Urine mg/dL 36  Protein, 75Y Urine 50 - 100 mg/day 360 (H)  (H): Data is abnormally high  RADIOGRAPHIC STUDIES: I have personally reviewed the radiological images as listed and agreed with the findings in the report.  CT CHEST ABDOMEN PELVIS W CONTRAST CLINICAL DATA:  Metastatic cecal colon cancer, rising CA marker, evaluate for treatment response. * Tracking Code: BO *  EXAM: CT CHEST, ABDOMEN, AND PELVIS WITH CONTRAST  TECHNIQUE: Multidetector CT imaging of the chest, abdomen and pelvis was performed following the standard protocol during bolus administration of intravenous contrast.  RADIATION DOSE REDUCTION: This exam was performed according to the departmental dose-optimization program which includes automated exposure control, adjustment of the mA and/or kV according to patient size and/or use of iterative reconstruction technique.  CONTRAST:  OMNIPAQUE  IOHEXOL  300 MG/ML  SOLN  COMPARISON:  Multiple priors including most recent CT June 25, 2024.  FINDINGS: CT CHEST FINDINGS  Cardiovascular: Right chest Port-A-Cath with tip near the superior cavoatrial junction. Normal size heart. No significant pericardial effusion/thickening.  Mediastinum/Nodes: No suspicious thyroid nodule.  Increased size of a subcarinal lymph node now measuring 14 mm in short axis on image 37/2 previously 9 mm.  Similar size of the additional supraclavicular, mediastinal and hilar lymph nodes. For reference:  -a right supraclavicular/low jugular lymph node measures 13 mm in short axis on image 6/2 previously 13 mm  -precarinal lymph node measures 14 mm in short axis on image 30/2 previously 12 mm  -right hilar lymph node measures 22 mm in short axis on  image 34/2 previously 23 mm.  Lungs/Pleura: Bilateral metastatic pulmonary nodules and masses some of which are slightly increased in size from prior examination. For reference:  -The left lower lobe mass previously indexed measures 7.4 x 4.8 cm on image 41/2 previously 7.4 x 4.5 cm when remeasured.  -right lower lobe lateral subpleural pulmonary nodule measures 16 mm on image 124/3 previously 12 mm.  -right lower lobe medial subpleural pulmonary nodule measures 2.7 x 0.9 cm on image 117/3, unchanged  Musculoskeletal: No aggressive lytic or blastic lesion of bone. Thoracic spondylosis. Surgical fixation screws in the bilateral shoulder girdle. Degenerative change of the bilateral glenohumeral joints.  CT ABDOMEN PELVIS FINDINGS  Hepatobiliary: No suspicious hepatic lesion. Subtle contour lobulations similar prior. Gallbladder is nondistended. No biliary ductal dilation.  Pancreas: No pancreatic ductal dilation or evidence of acute inflammation.  Spleen: Splenomegaly measuring 15.9 cm in maximum axial dimension on image 66/2.  Adrenals/Urinary Tract: Stable nodular thickening of the left adrenal gland measuring 13 mm on image 62/2, unchanged. Similar smooth thickening of the right adrenal gland.  No hydronephrosis. Kidneys demonstrate symmetric enhancement. Urinary bladder is unremarkable for degree of distension.  Stomach/Bowel: Radiopaque enteric contrast material traverses the rectum. Stomach is minimally distended. No pathologic dilation of small or large bowel.  Prior right hemicolectomy.  Vascular/Lymphatic: Normal caliber abdominal aorta. Smooth IVC contours. The portal, splenic and superior mesenteric veins are patent. Portosystemic collateral vessels.  No pathologically enlarged abdominal or pelvic lymph nodes.  Reproductive: Obscured by right hip arthroplasty.  Other: No significant abdominopelvic free fluid.  Musculoskeletal: No aggressive lytic or  blastic lesion of bone. Lumbar spondylosis. Right hip arthroplasty.  IMPRESSION: 1. Slight interval increase in size of bilateral metastatic pulmonary nodules and masses. 2. Increased size of a subcarinal lymph node with similar size of the additional supraclavicular, mediastinal and hilar lymph nodes. 3. Stable size of the left adrenal metastasis. 4.  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  Electronically Signed   By: Reyes Holder M.D.   On: 09/08/2024 13:47

## 2024-09-15 NOTE — Progress Notes (Signed)
 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 He is receiving dose reduced Folfiri + Beva q14d.   No pertinent past medical history   Met with patient in infusion. He reports poor appetite. Patient does not cook and has no one to cook for him. Usually will pick up a hot meal from meal service or has microwaved foods. Patient has been eating once a day. Patient did not eat yesterday as he accidentally purchased a spicy frozen spaghetti meal. He drinks Ensure occasionally when he can afford them. Nausea well controlled with antiemetics. Denies constipation or diarrhea.    Nutrition Focused Physical Exam:   Orbital Region: moderate Buccal Region: moderate Upper Arm Region: Air cabin crew and Lumbar Region: uta Temple Region: moderate  Clavicle Bone Region: moderate  Shoulder and Acromion Bone Region: uta Scapular Bone Region: uta Dorsal Hand: mild  Patellar Region: uta Anterior Thigh Region: uta Posterior Calf Region: uta Edema (RD assessment): uta Hair: reviewed  Eyes: reviewed  Mouth: reviewed  Skin: reviewed  Nails: reviewed    Medications: amlodipine , flexeril , lactulose , zestril , imodium , ativan , MMW, megace , melatonin, protonix , oxycodone , klor-con , compazine     Labs: glucose 135, K 3.3, BUN 6   Anthropometrics:   Height: 5'10 Weight: 174 lb  UBW: 192 lb (12/25/23) BMI: 24.97    NUTRITION DIAGNOSIS: Unintended wt loss related to cancer as evidenced by 9% wt loss in 9 months - significant    MALNUTRITION DIAGNOSIS: Patient meets criteria for moderate malnutrition in the context of chronic disease as evidenced by moderate fat/muscle depletions, 9% wt loss in 9 months    INTERVENTION:  Encourage high calorie high protein snacks during the day  Agreeable to cup of chicken noodle soup while here for infusion  Encourage 1-2 Ensure Complete/equivalent -  samples + coupons provided    MONITORING, EVALUATION, GOAL: Pt will tolerate adequate calories and protein to minimize further wt loss    Next Visit: Monday November 3 during infusion

## 2024-09-15 NOTE — Patient Instructions (Signed)

## 2024-09-15 NOTE — Patient Instructions (Signed)
 [facility] [place] - A DEPT OF [facility]  Discharge Instructions: Thank you for choosing [facility] to provide your oncology and hematology care.  If you have a lab appointment with the Cancer Center - please note that after April 8th, 2024, all labs will be drawn in the cancer center.  You do not have to check in or register with the main entrance as you have in the past but will complete your check-in in the cancer center.  Wear comfortable clothing and clothing appropriate for easy access to any Portacath or PICC line.   We strive to give you quality time with your provider. You may need to reschedule your appointment if you arrive late (15 or more minutes).  Arriving late affects you and other patients whose appointments are after yours.  Also, if you miss three or more appointments without notifying the office, you may be dismissed from the clinic at the provider's discretion.      For prescription refill requests, have your pharmacy contact our office and allow 72 hours for refills to be completed.    Today you received the following chemotherapy and/or immunotherapy agents MVASI/Folfiri   To help prevent nausea and vomiting after your treatment, we encourage you to take your nausea medication as directed.   BELOW ARE SYMPTOMS THAT SHOULD BE REPORTED IMMEDIATELY: *FEVER GREATER THAN 100.4 F (38 C) OR HIGHER *CHILLS OR SWEATING *NAUSEA AND VOMITING THAT IS NOT CONTROLLED WITH YOUR NAUSEA MEDICATION *UNUSUAL SHORTNESS OF BREATH *UNUSUAL BRUISING OR BLEEDING *URINARY PROBLEMS (pain or burning when urinating, or frequent urination) *BOWEL PROBLEMS (unusual diarrhea, constipation, pain near the anus) TENDERNESS IN MOUTH AND THROAT WITH OR WITHOUT PRESENCE OF ULCERS (sore throat, sores in mouth, or a toothache) UNUSUAL RASH, SWELLING OR PAIN  UNUSUAL VAGINAL DISCHARGE OR ITCHING   Items with * indicate a potential emergency and should be followed  up as soon as possible or go to the Emergency Department if any problems should occur.  Please show the CHEMOTHERAPY ALERT CARD or IMMUNOTHERAPY ALERT CARD at check-in to the Emergency Department and triage nurse.  Should you have questions after your visit or need to cancel or reschedule your appointment, please contact [facility] [place] - A DEPT OF Eligha Bridegroom [facility] [phone]  and follow the prompts.  Office hours are 8:00 a.m. to 4:30 p.m. Monday - Friday. Please note that voicemails left after 4:00 p.m. may not be returned until the following business day.  We are closed weekends and major holidays. You have access to a nurse at all times for urgent questions. Please call the main number to the clinic [phone] and follow the prompts.  For any non-urgent questions, you may also contact your provider using MyChart. We now offer e-Visits for anyone 8 and older to request care online for non-urgent symptoms. For details visit [email or web address].   Also download the MyChart app! Go to the app store, search "MyChart", open the app, select [place], and log in with your MyChart username and password.

## 2024-09-15 NOTE — Progress Notes (Signed)
 Patient presents today for MVASI /Folfiri infusion. Patient is in satisfactory condition with no new complaints voiced.  Vital signs are stable.  Labs reviewed by Dr. Davonna during the office visit and all other labs are within treatment parameters. Patient's urine protein noted to be 100. Patient will receive 40 mEq potassium chloride  p.o x 1 dose.   We will proceed with treatment per MD orders.   Treatment given today per MD orders. Tolerated infusion without adverse affects. Vital signs stable. No complaints at this time. Discharged from clinic ambulatory in stable condition. Alert and oriented x 3. F/U with [facility] as scheduled.  5FU ambulatory pump infusing with no alarms beeping.

## 2024-09-16 ENCOUNTER — Other Ambulatory Visit: Payer: Self-pay

## 2024-09-16 LAB — CEA: CEA: 230 ng/mL — ABNORMAL HIGH (ref 0.0–4.7)

## 2024-09-17 ENCOUNTER — Inpatient Hospital Stay

## 2024-09-17 VITALS — BP 138/92 | HR 94 | Temp 97.6°F | Resp 18

## 2024-09-17 DIAGNOSIS — Z95828 Presence of other vascular implants and grafts: Secondary | ICD-10-CM

## 2024-09-17 DIAGNOSIS — C180 Malignant neoplasm of cecum: Secondary | ICD-10-CM

## 2024-09-17 MED ORDER — PEGFILGRASTIM-CBQV 6 MG/0.6ML ~~LOC~~ SOSY
6.0000 mg | PREFILLED_SYRINGE | Freq: Once | SUBCUTANEOUS | Status: AC
  Administered 2024-09-17: 6 mg via SUBCUTANEOUS
  Filled 2024-09-17: qty 0.6

## 2024-09-17 NOTE — Progress Notes (Signed)
 Patient presents today for pump d/c. Vital signs are stable. Port a cath site clean, dry, and intact. Port flushed with 20 mls of Normal Saline. Needle removed intact. Band aid applied. .Patient has no complaints at this time. Discharged from clinic ambulatory and in stable condition. Patient alert and oriented.   Evan [name] presents today for injection per the provider's orders.  Udenyca  administration without incident; injection site WNL; see MAR for injection details.  Patient tolerated procedure well and without incident.  No questions or complaints noted at this time.

## 2024-09-29 ENCOUNTER — Inpatient Hospital Stay

## 2024-09-29 ENCOUNTER — Encounter: Payer: Self-pay | Admitting: Oncology

## 2024-09-29 VITALS — BP 122/80 | HR 66 | Temp 97.4°F | Resp 18

## 2024-09-29 DIAGNOSIS — Z95828 Presence of other vascular implants and grafts: Secondary | ICD-10-CM

## 2024-09-29 DIAGNOSIS — D649 Anemia, unspecified: Secondary | ICD-10-CM

## 2024-09-29 DIAGNOSIS — C180 Malignant neoplasm of cecum: Secondary | ICD-10-CM

## 2024-09-29 LAB — CBC WITH DIFFERENTIAL/PLATELET
Abs Immature Granulocytes: 1.53 K/uL — ABNORMAL HIGH (ref 0.00–0.07)
Basophils Absolute: 0.2 K/uL — ABNORMAL HIGH (ref 0.0–0.1)
Basophils Relative: 2 %
Eosinophils Absolute: 0.1 K/uL (ref 0.0–0.5)
Eosinophils Relative: 1 %
HCT: 34.4 % — ABNORMAL LOW (ref 39.0–52.0)
Hemoglobin: 10.7 g/dL — ABNORMAL LOW (ref 13.0–17.0)
Immature Granulocytes: 12 %
Lymphocytes Relative: 15 %
Lymphs Abs: 2 K/uL (ref 0.7–4.0)
MCH: 30.7 pg (ref 26.0–34.0)
MCHC: 31.1 g/dL (ref 30.0–36.0)
MCV: 98.9 fL (ref 80.0–100.0)
Monocytes Absolute: 1.4 K/uL — ABNORMAL HIGH (ref 0.1–1.0)
Monocytes Relative: 10 %
Neutro Abs: 7.9 K/uL — ABNORMAL HIGH (ref 1.7–7.7)
Neutrophils Relative %: 60 %
Platelets: 148 K/uL — ABNORMAL LOW (ref 150–400)
RBC: 3.48 MIL/uL — ABNORMAL LOW (ref 4.22–5.81)
RDW: 18.6 % — ABNORMAL HIGH (ref 11.5–15.5)
Smear Review: NORMAL
WBC: 13.1 K/uL — ABNORMAL HIGH (ref 4.0–10.5)
nRBC: 0.2 % (ref 0.0–0.2)

## 2024-09-29 LAB — URINALYSIS, DIPSTICK ONLY
Bilirubin Urine: NEGATIVE
Glucose, UA: NEGATIVE mg/dL
Hgb urine dipstick: NEGATIVE
Ketones, ur: NEGATIVE mg/dL
Leukocytes,Ua: NEGATIVE
Nitrite: NEGATIVE
Protein, ur: 100 mg/dL — AB
Specific Gravity, Urine: 1.016 (ref 1.005–1.030)
pH: 5 (ref 5.0–8.0)

## 2024-09-29 LAB — COMPREHENSIVE METABOLIC PANEL WITH GFR
ALT: 9 U/L (ref 0–44)
AST: 26 U/L (ref 15–41)
Albumin: 3.5 g/dL (ref 3.5–5.0)
Alkaline Phosphatase: 131 U/L — ABNORMAL HIGH (ref 38–126)
Anion gap: 11 (ref 5–15)
BUN: 6 mg/dL — ABNORMAL LOW (ref 8–23)
CO2: 24 mmol/L (ref 22–32)
Calcium: 8.3 mg/dL — ABNORMAL LOW (ref 8.9–10.3)
Chloride: 107 mmol/L (ref 98–111)
Creatinine, Ser: 0.98 mg/dL (ref 0.61–1.24)
GFR, Estimated: >60 mL/min (ref >60)
Glucose, Bld: 99 mg/dL (ref 70–99)
Potassium: 3.4 mmol/L — ABNORMAL LOW (ref 3.5–5.1)
Sodium: 142 mmol/L (ref 135–145)
Total Bilirubin: 0.3 mg/dL (ref 0.0–1.2)
Total Protein: 6.3 g/dL — ABNORMAL LOW (ref 6.5–8.1)

## 2024-09-29 LAB — MAGNESIUM: Magnesium: 1.9 mg/dL (ref 1.7–2.4)

## 2024-09-29 LAB — VITAMIN B12: Vitamin B-12: 1983 pg/mL — ABNORMAL HIGH (ref 180–914)

## 2024-09-29 LAB — IRON AND TIBC
Iron: 54 ug/dL (ref 45–182)
Saturation Ratios: 19 % (ref 17.9–39.5)
TIBC: 280 ug/dL (ref 250–450)
UIBC: 226 ug/dL

## 2024-09-29 LAB — FERRITIN: Ferritin: 589 ng/mL — ABNORMAL HIGH (ref 24–336)

## 2024-09-29 LAB — FOLATE: Folate: >20 ng/mL (ref >5.9)

## 2024-09-29 MED ORDER — SODIUM CHLORIDE 0.9 % IV SOLN
144.0000 mg/m2 | Freq: Once | INTRAVENOUS | Status: AC
  Administered 2024-09-29: 300 mg via INTRAVENOUS
  Filled 2024-09-29: qty 15

## 2024-09-29 MED ORDER — PALONOSETRON HCL INJECTION 0.25 MG/5ML
0.2500 mg | Freq: Once | INTRAVENOUS | Status: AC
  Administered 2024-09-29: 0.25 mg via INTRAVENOUS
  Filled 2024-09-29: qty 5

## 2024-09-29 MED ORDER — SODIUM CHLORIDE 0.9 % IV SOLN
150.0000 mg | Freq: Once | INTRAVENOUS | Status: AC
  Administered 2024-09-29: 150 mg via INTRAVENOUS
  Filled 2024-09-29: qty 5

## 2024-09-29 MED ORDER — DEXAMETHASONE SODIUM PHOSPHATE 10 MG/ML IJ SOLN
10.0000 mg | Freq: Once | INTRAMUSCULAR | Status: AC
  Administered 2024-09-29: 10 mg via INTRAVENOUS

## 2024-09-29 MED ORDER — ATROPINE SULFATE 1 MG/ML IV SOLN
1.0000 mg | Freq: Once | INTRAVENOUS | Status: AC
  Administered 2024-09-29: 1 mg via INTRAVENOUS
  Filled 2024-09-29: qty 1

## 2024-09-29 MED ORDER — FLUOROURACIL CHEMO INJECTION 2.5 GM/50ML
320.0000 mg/m2 | Freq: Once | INTRAVENOUS | Status: AC
  Administered 2024-09-29: 600 mg via INTRAVENOUS
  Filled 2024-09-29: qty 12

## 2024-09-29 MED ORDER — SODIUM CHLORIDE 0.9 % IV SOLN: Freq: Once | INTRAVENOUS | Status: AC

## 2024-09-29 MED ORDER — SODIUM CHLORIDE 0.9 % IV SOLN
400.0000 mg/m2 | Freq: Once | INTRAVENOUS | Status: AC
  Administered 2024-09-29: 780 mg via INTRAVENOUS
  Filled 2024-09-29: qty 39

## 2024-09-29 MED ORDER — SODIUM CHLORIDE 0.9 % IV SOLN
5.0000 mg/kg | Freq: Once | INTRAVENOUS | Status: AC
  Administered 2024-09-29: 400 mg via INTRAVENOUS
  Filled 2024-09-29: qty 16

## 2024-09-29 MED ORDER — SODIUM CHLORIDE 0.9 % IV SOLN
3100.0000 mg | INTRAVENOUS | Status: DC
  Administered 2024-09-29: 3100 mg via INTRAVENOUS
  Filled 2024-09-29: qty 62

## 2024-09-29 NOTE — Progress Notes (Signed)
 Labs reviewed with MD today, urine protein is 100 , ok to proceed with treatment today as planned per MD  Treatment given per orders. Patient tolerated it well without problems. Vitals stable and discharged home from clinic ambulatory. Follow up as scheduled.

## 2024-09-29 NOTE — Patient Instructions (Signed)
 [facility] [place] - A DEPT OF [facility]  Discharge Instructions: Thank you for choosing [facility] to provide your oncology and hematology care.  If you have a lab appointment with the Cancer Center - please note that after April 8th, 2024, all labs will be drawn in the cancer center.  You do not have to check in or register with the main entrance as you have in the past but will complete your check-in in the cancer center.  Wear comfortable clothing and clothing appropriate for easy access to any Portacath or PICC line.   We strive to give you quality time with your provider. You may need to reschedule your appointment if you arrive late (15 or more minutes).  Arriving late affects you and other patients whose appointments are after yours.  Also, if you miss three or more appointments without notifying the office, you may be dismissed from the clinic at the provider's discretion.      For prescription refill requests, have your pharmacy contact our office and allow 72 hours for refills to be completed.    Today you received the following chemotherapy and/or immunotherapy bevacizumab , irinotecan , leucovorin , adruicil   To help prevent nausea and vomiting after your treatment, we encourage you to take your nausea medication as directed.  BELOW ARE SYMPTOMS THAT SHOULD BE REPORTED IMMEDIATELY: *FEVER GREATER THAN 100.4 F (38 C) OR HIGHER *CHILLS OR SWEATING *NAUSEA AND VOMITING THAT IS NOT CONTROLLED WITH YOUR NAUSEA MEDICATION *UNUSUAL SHORTNESS OF BREATH *UNUSUAL BRUISING OR BLEEDING *URINARY PROBLEMS (pain or burning when urinating, or frequent urination) *BOWEL PROBLEMS (unusual diarrhea, constipation, pain near the anus) TENDERNESS IN MOUTH AND THROAT WITH OR WITHOUT PRESENCE OF ULCERS (sore throat, sores in mouth, or a toothache) UNUSUAL RASH, SWELLING OR PAIN  UNUSUAL VAGINAL DISCHARGE OR ITCHING   Items with * indicate a potential emergency  and should be followed up as soon as possible or go to the Emergency Department if any problems should occur.  Please show the CHEMOTHERAPY ALERT CARD or IMMUNOTHERAPY ALERT CARD at check-in to the Emergency Department and triage nurse.  Should you have questions after your visit or need to cancel or reschedule your appointment, please contact [facility] [place] - A DEPT OF [name] [phone]  and follow the prompts.  Office hours are 8:00 a.m. to 4:30 p.m. Monday - Friday. Please note that voicemails left after 4:00 p.m. may not be returned until the following business day.  We are closed weekends and major holidays. You have access to a nurse at all times for urgent questions. Please call the main number to the clinic [phone] and follow the prompts.  For any non-urgent questions, you may also contact your provider using MyChart. We now offer e-Visits for anyone 32 and older to request care online for non-urgent symptoms. For details visit [email or web address].   Also download the MyChart app! Go to the app store, search MyChart, open the app, select [place], and log in with your MyChart username and password.

## 2024-09-30 ENCOUNTER — Encounter (HOSPITAL_COMMUNITY): Payer: Self-pay | Admitting: Oncology

## 2024-09-30 ENCOUNTER — Encounter: Payer: Self-pay | Admitting: Oncology

## 2024-09-30 LAB — CEA: CEA: 203 ng/mL — ABNORMAL HIGH (ref 0.0–4.7)

## 2024-10-01 ENCOUNTER — Inpatient Hospital Stay

## 2024-10-01 VITALS — BP 149/98 | HR 85 | Temp 97.2°F | Resp 18

## 2024-10-01 DIAGNOSIS — C180 Malignant neoplasm of cecum: Secondary | ICD-10-CM | POA: Diagnosis not present

## 2024-10-01 DIAGNOSIS — Z95828 Presence of other vascular implants and grafts: Secondary | ICD-10-CM

## 2024-10-01 MED ORDER — PEGFILGRASTIM-CBQV 6 MG/0.6ML ~~LOC~~ SOSY
6.0000 mg | PREFILLED_SYRINGE | Freq: Once | SUBCUTANEOUS | Status: AC
  Administered 2024-10-01: 6 mg via SUBCUTANEOUS
  Filled 2024-10-01: qty 0.6

## 2024-10-01 NOTE — Progress Notes (Signed)
 Patient came into clinic today, upon pump d/c and after port a cath needle was removed. RN was disposing of chemo bag and noticed chemotherapy bag was still full of chemotherapy.   RN made checks on the ambulatory pump: verified clamp was open and settings were correct, and they were.   MD and pharmacy notified. Will re access port with new needle and assign a new ambulatory pump and chemotherapy will be administered over the next 46 hours per orders. Patient has a new appt to come in on Friday to have pump disconnected per protocol.   Encouraged pt to look at chemotherapy bag periodically to make sure the chemotherapy bag is emptying appropriately.

## 2024-10-01 NOTE — Patient Instructions (Signed)
 [facility] [place] - A DEPT OF [place]  Discharge Instructions: Thank you for choosing [facility] to provide your oncology and hematology care.  If you have a lab appointment with the Cancer Center - please note that after April 8th, 2024, all labs will be drawn in the cancer center.  You do not have to check in or register with the main entrance as you have in the past but will complete your check-in in the cancer center.  Wear comfortable clothing and clothing appropriate for easy access to any Portacath or PICC line.   We strive to give you quality time with your provider. You may need to reschedule your appointment if you arrive late (15 or more minutes).  Arriving late affects you and other patients whose appointments are after yours.  Also, if you miss three or more appointments without notifying the office, you may be dismissed from the clinic at the provider's discretion.      For prescription refill requests, have your pharmacy contact our office and allow 72 hours for refills to be completed.    Today you received the following injection: udenyca , ambulatory pump had to be reconnected today     To help prevent nausea and vomiting after your treatment, we encourage you to take your nausea medication as directed.  BELOW ARE SYMPTOMS THAT SHOULD BE REPORTED IMMEDIATELY: *FEVER GREATER THAN 100.4 F (38 C) OR HIGHER *CHILLS OR SWEATING *NAUSEA AND VOMITING THAT IS NOT CONTROLLED WITH YOUR NAUSEA MEDICATION *UNUSUAL SHORTNESS OF BREATH *UNUSUAL BRUISING OR BLEEDING *URINARY PROBLEMS (pain or burning when urinating, or frequent urination) *BOWEL PROBLEMS (unusual diarrhea, constipation, pain near the anus) TENDERNESS IN MOUTH AND THROAT WITH OR WITHOUT PRESENCE OF ULCERS (sore throat, sores in mouth, or a toothache) UNUSUAL RASH, SWELLING OR PAIN  UNUSUAL VAGINAL DISCHARGE OR ITCHING   Items with * indicate a potential emergency and should be  followed up as soon as possible or go to the Emergency Department if any problems should occur.  Please show the CHEMOTHERAPY ALERT CARD or IMMUNOTHERAPY ALERT CARD at check-in to the Emergency Department and triage nurse.  Should you have questions after your visit or need to cancel or reschedule your appointment, please contact [facility]  - A DEPT OF [name] [phone]  and follow the prompts.  Office hours are 8:00 a.m. to 4:30 p.m. Monday - Friday. Please note that voicemails left after 4:00 p.m. may not be returned until the following business day.  We are closed weekends and major holidays. You have access to a nurse at all times for urgent questions. Please call the main number to the clinic [phone] and follow the prompts.  For any non-urgent questions, you may also contact your provider using MyChart. We now offer e-Visits for anyone 31 and older to request care online for non-urgent symptoms. For details visit [email or web address].   Also download the MyChart app! Go to the app store, search MyChart, open the app, select [place], and log in with your MyChart username and password.

## 2024-10-03 ENCOUNTER — Inpatient Hospital Stay

## 2024-10-03 NOTE — Patient Instructions (Signed)

## 2024-10-03 NOTE — Progress Notes (Signed)
 Patient for chemotherapy pump disconnect with no complaints voiced.  Patients port flushed without difficulty.  Good blood return noted with no bruising or swelling noted at site.  Band aid applied.  VSS with discharge and left ambulatory with no s/s of distress noted.

## 2024-10-06 ENCOUNTER — Other Ambulatory Visit: Payer: Self-pay | Admitting: Oncology

## 2024-10-06 DIAGNOSIS — Z95828 Presence of other vascular implants and grafts: Secondary | ICD-10-CM

## 2024-10-06 DIAGNOSIS — C180 Malignant neoplasm of cecum: Secondary | ICD-10-CM

## 2024-10-07 ENCOUNTER — Other Ambulatory Visit: Payer: Self-pay

## 2024-10-10 ENCOUNTER — Other Ambulatory Visit: Payer: Self-pay | Admitting: Oncology

## 2024-10-10 MED ORDER — OXYCODONE HCL 10 MG PO TABS: 10.0000 mg | ORAL_TABLET | ORAL | 0 refills | Status: DC | PRN

## 2024-10-13 ENCOUNTER — Inpatient Hospital Stay

## 2024-10-13 ENCOUNTER — Inpatient Hospital Stay: Attending: Hematology

## 2024-10-13 ENCOUNTER — Other Ambulatory Visit: Payer: Self-pay | Admitting: *Deleted

## 2024-10-13 ENCOUNTER — Inpatient Hospital Stay (HOSPITAL_BASED_OUTPATIENT_CLINIC_OR_DEPARTMENT_OTHER): Admitting: Oncology

## 2024-10-13 ENCOUNTER — Inpatient Hospital Stay: Admitting: Dietician

## 2024-10-13 ENCOUNTER — Encounter: Payer: Self-pay | Admitting: Oncology

## 2024-10-13 VITALS — BP 140/81 | HR 70 | Temp 97.8°F | Resp 18

## 2024-10-13 DIAGNOSIS — Z95828 Presence of other vascular implants and grafts: Secondary | ICD-10-CM

## 2024-10-13 DIAGNOSIS — C180 Malignant neoplasm of cecum: Secondary | ICD-10-CM

## 2024-10-13 DIAGNOSIS — C7802 Secondary malignant neoplasm of left lung: Secondary | ICD-10-CM | POA: Insufficient documentation

## 2024-10-13 DIAGNOSIS — C782 Secondary malignant neoplasm of pleura: Secondary | ICD-10-CM | POA: Diagnosis not present

## 2024-10-13 DIAGNOSIS — E876 Hypokalemia: Secondary | ICD-10-CM

## 2024-10-13 DIAGNOSIS — C7971 Secondary malignant neoplasm of right adrenal gland: Secondary | ICD-10-CM | POA: Diagnosis not present

## 2024-10-13 DIAGNOSIS — Z79899 Other long term (current) drug therapy: Secondary | ICD-10-CM | POA: Insufficient documentation

## 2024-10-13 DIAGNOSIS — F17210 Nicotine dependence, cigarettes, uncomplicated: Secondary | ICD-10-CM | POA: Diagnosis not present

## 2024-10-13 DIAGNOSIS — D72829 Elevated white blood cell count, unspecified: Secondary | ICD-10-CM | POA: Insufficient documentation

## 2024-10-13 DIAGNOSIS — Z5112 Encounter for antineoplastic immunotherapy: Secondary | ICD-10-CM | POA: Diagnosis present

## 2024-10-13 DIAGNOSIS — C7801 Secondary malignant neoplasm of right lung: Secondary | ICD-10-CM | POA: Insufficient documentation

## 2024-10-13 DIAGNOSIS — Z5189 Encounter for other specified aftercare: Secondary | ICD-10-CM | POA: Insufficient documentation

## 2024-10-13 DIAGNOSIS — C7972 Secondary malignant neoplasm of left adrenal gland: Secondary | ICD-10-CM | POA: Diagnosis not present

## 2024-10-13 DIAGNOSIS — Z5111 Encounter for antineoplastic chemotherapy: Secondary | ICD-10-CM | POA: Diagnosis present

## 2024-10-13 DIAGNOSIS — D649 Anemia, unspecified: Secondary | ICD-10-CM | POA: Diagnosis not present

## 2024-10-13 LAB — CBC WITH DIFFERENTIAL/PLATELET
Abs Immature Granulocytes: 0.29 K/uL — ABNORMAL HIGH (ref 0.00–0.07)
Basophils Absolute: 0.1 K/uL (ref 0.0–0.1)
Basophils Relative: 1 %
Eosinophils Absolute: 0.3 K/uL (ref 0.0–0.5)
Eosinophils Relative: 3 %
HCT: 36.4 % — ABNORMAL LOW (ref 39.0–52.0)
Hemoglobin: 11.5 g/dL — ABNORMAL LOW (ref 13.0–17.0)
Immature Granulocytes: 3 %
Lymphocytes Relative: 19 %
Lymphs Abs: 2.1 K/uL (ref 0.7–4.0)
MCH: 30.2 pg (ref 26.0–34.0)
MCHC: 31.6 g/dL (ref 30.0–36.0)
MCV: 95.5 fL (ref 80.0–100.0)
Monocytes Absolute: 1.2 K/uL — ABNORMAL HIGH (ref 0.1–1.0)
Monocytes Relative: 11 %
Neutro Abs: 6.8 K/uL (ref 1.7–7.7)
Neutrophils Relative %: 63 %
Platelets: 179 K/uL (ref 150–400)
RBC: 3.81 MIL/uL — ABNORMAL LOW (ref 4.22–5.81)
RDW: 18.1 % — ABNORMAL HIGH (ref 11.5–15.5)
WBC: 10.9 K/uL — ABNORMAL HIGH (ref 4.0–10.5)
nRBC: 0 % (ref 0.0–0.2)

## 2024-10-13 LAB — URINALYSIS, DIPSTICK ONLY
Bilirubin Urine: NEGATIVE
Glucose, UA: NEGATIVE mg/dL
Ketones, ur: NEGATIVE mg/dL
Leukocytes,Ua: NEGATIVE
Nitrite: NEGATIVE
Protein, ur: 100 mg/dL — AB
Specific Gravity, Urine: 1.016 (ref 1.005–1.030)
pH: 5 (ref 5.0–8.0)

## 2024-10-13 LAB — COMPREHENSIVE METABOLIC PANEL WITH GFR
ALT: 9 U/L (ref 0–44)
AST: 29 U/L (ref 15–41)
Albumin: 3.6 g/dL (ref 3.5–5.0)
Alkaline Phosphatase: 138 U/L — ABNORMAL HIGH (ref 38–126)
Anion gap: 12 (ref 5–15)
BUN: 5 mg/dL — ABNORMAL LOW (ref 8–23)
CO2: 24 mmol/L (ref 22–32)
Calcium: 8.4 mg/dL — ABNORMAL LOW (ref 8.9–10.3)
Chloride: 102 mmol/L (ref 98–111)
Creatinine, Ser: 0.86 mg/dL (ref 0.61–1.24)
GFR, Estimated: >60 mL/min (ref >60)
Glucose, Bld: 136 mg/dL — ABNORMAL HIGH (ref 70–99)
Potassium: 3 mmol/L — ABNORMAL LOW (ref 3.5–5.1)
Sodium: 137 mmol/L (ref 135–145)
Total Bilirubin: 0.4 mg/dL (ref 0.0–1.2)
Total Protein: 6.7 g/dL (ref 6.5–8.1)

## 2024-10-13 LAB — MAGNESIUM: Magnesium: 2 mg/dL (ref 1.7–2.4)

## 2024-10-13 MED ORDER — FLUOROURACIL CHEMO INJECTION 2.5 GM/50ML
320.0000 mg/m2 | Freq: Once | INTRAVENOUS | Status: AC
  Administered 2024-10-13: 600 mg via INTRAVENOUS
  Filled 2024-10-13: qty 12

## 2024-10-13 MED ORDER — SODIUM CHLORIDE 0.9 % IV SOLN: Freq: Once | INTRAVENOUS | Status: AC

## 2024-10-13 MED ORDER — POTASSIUM CHLORIDE CRYS ER 20 MEQ PO TBCR: 20.0000 meq | EXTENDED_RELEASE_TABLET | Freq: Two times a day (BID) | ORAL | 0 refills | Status: AC

## 2024-10-13 MED ORDER — SODIUM CHLORIDE 0.9 % IV SOLN
400.0000 mg/m2 | Freq: Once | INTRAVENOUS | Status: AC
  Administered 2024-10-13: 780 mg via INTRAVENOUS
  Filled 2024-10-13: qty 39

## 2024-10-13 MED ORDER — DEXAMETHASONE SODIUM PHOSPHATE 10 MG/ML IJ SOLN
10.0000 mg | Freq: Once | INTRAMUSCULAR | Status: AC
  Administered 2024-10-13: 10 mg via INTRAVENOUS

## 2024-10-13 MED ORDER — SODIUM CHLORIDE 0.9 % IV SOLN
5.0000 mg/kg | Freq: Once | INTRAVENOUS | Status: AC
  Administered 2024-10-13: 400 mg via INTRAVENOUS
  Filled 2024-10-13: qty 16

## 2024-10-13 MED ORDER — SODIUM CHLORIDE 0.9 % IV SOLN
150.0000 mg | Freq: Once | INTRAVENOUS | Status: AC
  Administered 2024-10-13: 150 mg via INTRAVENOUS
  Filled 2024-10-13: qty 150

## 2024-10-13 MED ORDER — PALONOSETRON HCL INJECTION 0.25 MG/5ML
0.2500 mg | Freq: Once | INTRAVENOUS | Status: AC
  Administered 2024-10-13: 0.25 mg via INTRAVENOUS
  Filled 2024-10-13: qty 5

## 2024-10-13 MED ORDER — ATROPINE SULFATE 1 MG/ML IV SOLN
1.0000 mg | Freq: Once | INTRAVENOUS | Status: AC
  Administered 2024-10-13: 1 mg via INTRAVENOUS
  Filled 2024-10-13: qty 1

## 2024-10-13 MED ORDER — MEGESTROL ACETATE 400 MG/10ML PO SUSP: 400.0000 mg | Freq: Two times a day (BID) | ORAL | 3 refills | Status: AC

## 2024-10-13 MED ORDER — SODIUM CHLORIDE 0.9 % IV SOLN
3100.0000 mg | INTRAVENOUS | Status: DC
  Administered 2024-10-13: 3100 mg via INTRAVENOUS
  Filled 2024-10-13: qty 62

## 2024-10-13 MED ORDER — SODIUM CHLORIDE 0.9 % IV SOLN
144.0000 mg/m2 | Freq: Once | INTRAVENOUS | Status: AC
  Administered 2024-10-13: 300 mg via INTRAVENOUS
  Filled 2024-10-13: qty 15

## 2024-10-13 NOTE — Progress Notes (Signed)
 Patient presents today for MVASI /Folfiri with 5FU pump start per providers order.  Vital signs and labs reviewed by NP.  Urine protein noted to be 100, NP aware.  Message received from Delon Hope NP, patient okay for treatment.    Treatment given today per MD orders.  Tolerated infusion without adverse affects.  Vital signs stable.  5FU pump connected and verified RUN on the screen with the patient.  No complaints at this time.  Discharge from clinic ambulatory in stable condition.  Alert and oriented X 3.  Follow up with [facility] as scheduled.

## 2024-10-13 NOTE — Progress Notes (Signed)
 Proceed with Fluorouracil  pump at 3100 mg for today.  V.O. Delon Hope, NP/Byrant Valent Joshua, PharmD

## 2024-10-13 NOTE — Progress Notes (Signed)
 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 He is receiving dose reduced Folfiri + Beva q14d.   Met with patient in infusion. He is doing okay today. Appetite is about the same. Says eating is challenging. Patient does not have an appetite and does not cook. He doesn't think he ate anything yesterday. Patient prescribed Megace  in July. He is agreeable to start taking again. Patient has been drinking Ensure, however does not consume regularly. He is asking if drinking daily would make that much of a difference.   Patient taking colace for constipation which worsens after treatment. This works pretty good. He does not drink hardly any water . Likes soda and fruit punch. He does like milk, but hasn't picked up any in a while. Says he will stop by the store today.    Medications: oxycodone  (10/31)  Labs: K 3.0, glucose 136, BUN 5, alk phos 138  Anthropometrics: Wt 175 lb 0.7 oz today -   10/20 - 180 lb  10/6 - 174 lb  9/23 - 174 lb 2.6 oz  8/18 - 178 lb 6.4 oz    NUTRITION DIAGNOSIS: Unintended wt loss - continues; Severe malnutrition continues    INTERVENTION:  Reinforced importance of adequate calorie and protein energy intake to maintain weights/strength Encourage daily Ensure Plus/equivalent - pt politely declines samples at this time Discussed importance of hydration - suggested diluting fruit punch, adding flavoring drops to water  to give taste, switching to clear/uncaffeinated sodas, drinking milk Agreeable to restarting megace  for appetite - refill sent to pt pharmacy  Support and encouragement     MONITORING, EVALUATION, GOAL: wt trends, intake    NEXT VISIT: Monday November 17 during infusion

## 2024-10-13 NOTE — Patient Instructions (Signed)
 [facility] [place] - A DEPT OF [place]  Discharge Instructions: Thank you for choosing [facility] to provide your oncology and hematology care.  If you have a lab appointment with the Cancer Center - please note that after April 8th, 2024, all labs will be drawn in the cancer center.  You do not have to check in or register with the main entrance as you have in the past but will complete your check-in in the cancer center.  Wear comfortable clothing and clothing appropriate for easy access to any Portacath or PICC line.   We strive to give you quality time with your provider. You may need to reschedule your appointment if you arrive late (15 or more minutes).  Arriving late affects you and other patients whose appointments are after yours.  Also, if you miss three or more appointments without notifying the office, you may be dismissed from the clinic at the provider's discretion.      For prescription refill requests, have your pharmacy contact our office and allow 72 hours for refills to be completed.    Today you received the following chemotherapy and/or immunotherapy agents MVASI ,Leucovorin ,Irinotecan ,Fluorouracil , 5FU pump      To help prevent nausea and vomiting after your treatment, we encourage you to take your nausea medication as directed.  BELOW ARE SYMPTOMS THAT SHOULD BE REPORTED IMMEDIATELY: *FEVER GREATER THAN 100.4 F (38 C) OR HIGHER *CHILLS OR SWEATING *NAUSEA AND VOMITING THAT IS NOT CONTROLLED WITH YOUR NAUSEA MEDICATION *UNUSUAL SHORTNESS OF BREATH *UNUSUAL BRUISING OR BLEEDING *URINARY PROBLEMS (pain or burning when urinating, or frequent urination) *BOWEL PROBLEMS (unusual diarrhea, constipation, pain near the anus) TENDERNESS IN MOUTH AND THROAT WITH OR WITHOUT PRESENCE OF ULCERS (sore throat, sores in mouth, or a toothache) UNUSUAL RASH, SWELLING OR PAIN  UNUSUAL VAGINAL DISCHARGE OR ITCHING   Items with * indicate a  potential emergency and should be followed up as soon as possible or go to the Emergency Department if any problems should occur.  Please show the CHEMOTHERAPY ALERT CARD or IMMUNOTHERAPY ALERT CARD at check-in to the Emergency Department and triage nurse.  Should you have questions after your visit or need to cancel or reschedule your appointment, please contact [facility]CER CTR [place] - A DEPT OF [name] [phone]  and follow the prompts.  Office hours are 8:00 a.m. to 4:30 p.m. Monday - Friday. Please note that voicemails left after 4:00 p.m. may not be returned until the following business day.  We are closed weekends and major holidays. You have access to a nurse at all times for urgent questions. Please call the main number to the clinic [phone] and follow the prompts.  For any non-urgent questions, you may also contact your provider using MyChart. We now offer e-Visits for anyone 91 and older to request care online for non-urgent symptoms. For details visit [email or web address].   Also download the MyChart app! Go to the app store, search MyChart, open the app, select [place], and log in with your MyChart username and password.

## 2024-10-13 NOTE — Progress Notes (Signed)
 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 male was transferred to my care after his prior physician has left.   ASSESSMENT & PLAN:   Assessment & Plan: Evan [name]  is a 68 y.o. 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 Extensive oncology history below. Patient currently on FOLFIRI and bevacizumab (1st line) and tolerating well.  5-FU and irinotecan  dose reduced for mucositis. CT scan from 09/08/2024 shows slight increase in the supraclavicular lymph node and one of the lung nodules but stable at all the other sites.  There are no new lesions. CEA trended down and treatment was continued. Repeat CT scan if CEA trends up. Patient did not receive oxaliplatin before.  Will consider FOLFOX plus bevacizumab  on progression. Labs from today show stable elevated white blood cell count, stable hemoglobin with normal platelets. Continue Chemotherapy every 2 weeks.   -Labs from today show normal magnesium , potassium 3.0, white blood cell count 10.9, hemoglobin 11.5 with normal platelet count.  CEA is pending.  UA shows small amount of blood and protein. - Physical exam stable today.  Proceed with chemotherapy today. - Continue chemotherapy every 2 weeks.  Return to clinic in 4 weeks for follow-up.  Diarrhea secondary to cancer treatment Recurrent diarrhea managed with Imodium .  - Advised to continue use of Imodium  as needed for diarrhea.  Insomnia Chronic insomnia. Ativan  ineffective. Screen time and late meals possible factors.  - Advise no TV or phone use before bed. - Recommend dinner at least  one hour before bed. - Prescribed melatonin 5 mg at 7 PM daily. - Continue Ativan  0.5 mg nightly - Reassess sleep quality in one month.  Chronic obstructive pulmonary disease (COPD) Mild wheezing likely due to smoking. No inhaler use.  - Advise smoking cessation to improve respiratory symptoms.  Nicotine dependence, current smoker Resumed smoking 1-2 cigarettes per day after quitting.  - Advise smoking cessation.  Mucositis Improved after dose reducing chemotherapy  -Continue to use Magic mouthwash as needed  Anemia Likely multifactorial-iron deficiency, anemia of chronic disease, immunosuppression from chemotherapy  - Will obtain anemia panel with next blood draw  Hypokalemia Will replace per protocol Rx sent for 40 mill equivalents daily x 5 days.  Recheck labs at next chemo.   The patient understands the plans discussed today and is in agreement with them.  He knows to contact our office if he develops concerns prior to his next appointment.  I spent 25 minutes dedicated to the care of this patient (face-to-face and non-face-to-face) on the date of the encounter to include what is described in the assessment and plan.  Evan FORBES Hope, NP  [facility] [place] - A DEPT OF [name] [address] [place] [address] Dept: [phone] Dept Fax: [phone]   No orders of the defined types were placed in this encounter.    ONCOLOGY HISTORY:   I have reviewed his chart and materials related to his cancer extensively and collaborated history with the patient. Summary of oncologic history is as follows:   Metastatic colon cancer to lungs and adrenal gland  -  Presentation: Dry cough for 6 months, 30 pound weight loss in 2 years -12/16/2021: CT chest with contrast: Bulky left hilar mass centered about the superior segment left lower lobe, measuring 8.1 x 7.5 cm. Numerous bilateral pulmonary masses and  nodules of varying sizes. Numerous bulky mediastinal and bilateral hilar lymph nodes. New left adrenal nodule. -12/26/2021: CEA: 15.6 -01/03/2022: CT abdomen and pelvis: Isolated left adrenal metastasis, as on chest CT. No other evidence of metastatic disease within the abdomen or pelvis. -01/03/2022: MRI brain: No evidence of intracranial metastatic disease. -01/17/2022: Left lung core needle biopsy: Adenocarcinoma with associated necrosis  -By immunohistochemistry, the neoplastic cells are positive for CK20 and CDX2 but negative for cytokeratin 7.   -The morphology and immunophenotype are consistent with a primary colonic adenocarcinoma.  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  -09/12/2023: Added bevacizumab  secondary to progression  -5-FU and irinotecan  dose reduced for mucositis -05/17/2022: CT CAP: Mediastinal and hilar lymph nodes have decreased in size. Largest perihilar left lower lobe lung mass has decreased in size. Right upper lobe mass slightly increased in size. Other nodules are stable. Left adrenal mass decreased to 1.3 cm from 2.8 cm.  -07/2022-01/2023: Stable findings on CT CAP -09/05/2023: CT CAP: Increased size of dominant left lower lobe pulmonary nodule with new central occlusion of the superior left upper lobe segmental bronchus. Additional bilateral irregular solid pulmonary nodules are stable or slightly increased in size. Increased size of right hilar lymph node. Stable pleural metastatic disease. Stable left adrenal gland nodule. - 11/07/2022: CEA: 62.6 - 10/29/2023: CEA: 159 - 12/2023-06/2024: Stable findings on CT CAP -07/28/2024: CEA: 211 - 09/02/2024: CEA:  195 - 09/08/2024: CT CAP: Slight interval increase in size of bilateral metastatic pulmonary nodules and masses (12 to 16 mm). Increased size of a subcarinal lymph node(9 mm to 14 mm) with similar size of the additional supraclavicular, mediastinal and hilar lymph nodes. Stable size of the left adrenal metastasis.   Current Treatment:  FOLFIRI + Bevacizumab   INTERVAL HISTORY:  Discussed the use of AI scribe software for clinical note transcription with the patient, who gave verbal consent to proceed.  History of Present Illness Evan [name] is a 67 year old male with metastatic colon cancer who presents to clinic today for his next chemotherapy.    Reports he continues to tolerate treatment well.  Reports chest pain, constipation and diarrhea.  Rates chest pain 4 out of 10 in severity and it comes and goes.  Has an appetite of 40% energy levels are 25%.  Report his weight is maintaining but he is down about 25 pounds from his initial diagnosis.  He experiences difficulty sleeping, characterized by tossing and turning throughout the night. Previously prescribed Ativan  has not been effective. He engages in watching TV and using his phone before bed but denies other activities when unable to sleep.  He has a history of colon cancer and recently underwent imaging. His cancer marker levels, previously elevated, have decreased.  He frequently experiences diarrhea, particularly following treatments, and uses Imodium  for relief. Mouth sores have improved and are no longer present. No nausea, vomiting, numbness, or tingling in his hands and feet. Neck pain, previously present, has improved with Flexeril .  He  has a history of smoking, having quit and then restarted about two weeks ago, currently smoking one to two cigarettes a day. He does not use inhalers.  I have reviewed the past medical history, past surgical history, social history and family history with the patient and they are unchanged from  previous note.  ALLERGIES:  has no known allergies.  MEDICATIONS:  Current Outpatient Medications  Medication Sig Dispense Refill   aluminum -magnesium  hydroxide-simethicone  (MAALOX) 200-200-20 MG/5ML SUSP Take 30 mLs by mouth 4 (four) times daily -  before meals and at bedtime. 1680 mL 2   amLODipine  (NORVASC ) 10 MG tablet Take 1 tablet (10 mg total) by mouth daily. 90 tablet 3   Bevacizumab  (AVASTIN  IV) Inject into the vein every 14 (fourteen) days. *start date TBD     cyclobenzaprine  (FLEXERIL ) 10 MG tablet Take 1 tablet (10 mg total) by mouth at bedtime. 5 tablet 0   dexamethasone  (DECADRON ) 0.5 MG/5ML solution 10ml swish and spit twice daily for 5 days after each chemo treatment 240 mL 3   ENULOSE  10 GM/15ML SOLN Take 10 g by mouth daily as needed.     fluorouracil  CALGB 19297 2,400 mg/m2 in sodium chloride  0.9 % 150 mL Inject 2,400 mg/m2 into the vein over 48 hr.     FLUOROURACIL  IV Inject into the vein every 14 (fourteen) days.     Lactulose  20 GM/30ML SOLN Take 15 mLs (10 g total) by mouth at bedtime. Take 15 ml at bedtime every night to assist with regular bowel movements.  Titrate down if having multiple bowel movements.  If a bowel movement has not occurred in 3 to 4 days or longer, then take 15 ml every 3 hours until a bowel movent has occurred. 450 mL 5   LEUCOVORIN  CALCIUM  IV Inject into the vein every 14 (fourteen) days.     lidocaine -prilocaine  (EMLA ) cream Apply 1 Application topically as needed (Apply to port site 30 mins- 1 hour prior to treatment/labs.). 30 g 0   lisinopril  (ZESTRIL ) 10 MG tablet TAKE ONE TABLET BY MOUTH DAILY 30 tablet 3   loperamide  (IMODIUM ) 2 MG capsule TAKE 2 CAPSULES BY MOUTH AFTER FIRST LOOSE STOOL,AND THEN 1 CAPSULE AFTER 2ND LOOSE STOOL. DO NOT EXCEED 8 CAPS IN 24 HOURS 30 capsule 0   LORazepam  (ATIVAN ) 0.5 MG tablet Take 1 tablet (0.5 mg total) by mouth at bedtime. 30 tablet 2   magic mouthwash w/lidocaine  SOLN Take 5 mLs by mouth 4 (four) times  daily as needed for mouth pain. 250 mL 1   Melatonin 5 MG CAPS Take 1 capsule (5 mg total) by mouth at bedtime. Take one capsule by mouth every evening at 7 pm 30 capsule 3   Misc. Devices MISC Please provide patient with 1:1 ratio of magic mouthwash and lidocaine  to swish and swallow QID 1 each 3   naloxone  (NARCAN ) nasal spray 4 mg/0.1 mL 0.1ml nasal spray in one nostril. May repeat the dose in other nostril in 2-3 minutes if needed. 2 each 3   Oxycodone  HCl 10 MG TABS Take 1 tablet (10 mg total) by mouth every 4 (four) hours as needed. 180 tablet 0   pantoprazole  (PROTONIX ) 40 MG tablet Take 1 tablet (40 mg total) by mouth daily. 30 tablet 11   potassium chloride  SA (KLOR-CON  M) 20 MEQ tablet Take 1 tablet (20 mEq total) by mouth daily. 90 tablet 4   potassium chloride  SA (KLOR-CON  M) 20 MEQ tablet Take 1 tablet (20 mEq total)  by mouth 2 (two) times daily. 14 tablet 0   prochlorperazine  (COMPAZINE ) 10 MG tablet Take 1 tablet (10 mg total) by mouth every 6 (six) hours as needed (NAUSEA). 60 tablet 2   megestrol  (MEGACE ) 400 MG/10ML suspension Take 10 mLs (400 mg total) by mouth 2 (two) times daily. 480 mL 3   No current facility-administered medications for this visit.   Facility-Administered Medications Ordered in Other Visits  Medication Dose Route Frequency Provider Last Rate Last Admin   fluorouracil  (ADRUCIL ) 3,100 mg in sodium chloride  0.9 % 88 mL chemo infusion  3,100 mg Intravenous 1 day or 1 dose Kandala, Hyndavi, MD   Infusion Verify at 10/13/24 1418   potassium chloride  SA (KLOR-CON  M) CR tablet 40 mEq  40 mEq Oral Once Katragadda, Sreedhar, MD        REVIEW OF SYSTEMS:   Review of Systems  Constitutional:  Positive for malaise/fatigue and weight loss.  Cardiovascular:  Positive for chest pain.  Gastrointestinal:  Positive for constipation and diarrhea.  Psychiatric/Behavioral:  Positive for depression.       VITALS:  There were no vitals taken for this visit.  Wt  Readings from Last 3 Encounters:  10/13/24 175 lb 0.7 oz (79.4 kg)  09/29/24 180 lb (81.6 kg)  09/15/24 174 lb (78.9 kg)    There is no height or weight on file to calculate BMI.  Performance status (ECOG): 1 - Symptomatic but completely ambulatory  PHYSICAL EXAM:   GENERAL:alert, no distress and comfortable SKIN: skin color, texture, turgor are normal, no rashes or significant lesions LYMPH:  no palpable lymphadenopathy in the cervical, axillary or inguinal LUNGS: clear to auscultation and percussion with normal breathing effort HEART: regular rate & rhythm and no murmurs and no lower extremity edema ABDOMEN:abdomen soft, non-tender and normal bowel sounds Musculoskeletal:no cyanosis of digits and no clubbing  NEURO: alert & oriented x 3 with fluent speech, no focal motor/sensory deficits  LABORATORY DATA:  I have reviewed the data as listed   Lab Results  Component Value Date   WBC 10.9 (H) 10/13/2024   NEUTROABS 6.8 10/13/2024   HGB 11.5 (L) 10/13/2024   HCT 36.4 (L) 10/13/2024   MCV 95.5 10/13/2024   PLT 179 10/13/2024      Chemistry      Component Value Date/Time   NA 137 10/13/2024 0855   K 3.0 (L) 10/13/2024 0855   CL 102 10/13/2024 0855   CO2 24 10/13/2024 0855   BUN 5 (L) 10/13/2024 0855   CREATININE 0.86 10/13/2024 0855      Component Value Date/Time   CALCIUM  8.4 (L) 10/13/2024 0855   ALKPHOS 138 (H) 10/13/2024 0855   AST 29 10/13/2024 0855   ALT 9 10/13/2024 0855   BILITOT 0.4 10/13/2024 0855        Latest Reference Range & Units 09/15/24 09:24  Appearance CLEAR  CLEAR  Bilirubin Urine NEGATIVE  NEGATIVE  Color, Urine YELLOW  YELLOW  Glucose, UA NEGATIVE mg/dL NEGATIVE  Hgb urine dipstick NEGATIVE  SMALL !  Ketones, ur NEGATIVE mg/dL NEGATIVE  Leukocytes,Ua NEGATIVE  NEGATIVE  Nitrite NEGATIVE  NEGATIVE  pH 5.0 - 8.0  5.0  Protein NEGATIVE mg/dL 899 !  Specific Gravity, Urine 1.005 - 1.030  1.013  !: Data is abnormal   Latest Reference  Range & Units 08/21/24 10:31  Urine Total Volume-UPROT mL 1,000  Collection Interval-UPROT hours 24  Protein, Urine mg/dL 36  Protein, 75Y Urine 50 - 100 mg/day 360 (  H)  (H): Data is abnormally high  RADIOGRAPHIC STUDIES: I have personally reviewed the radiological images as listed and agreed with the findings in the report.  CT CHEST ABDOMEN PELVIS W CONTRAST CLINICAL DATA:  Metastatic cecal colon cancer, rising CA marker, evaluate for treatment response. * Tracking Code: BO *  EXAM: CT CHEST, ABDOMEN, AND PELVIS WITH CONTRAST  TECHNIQUE: Multidetector CT imaging of the chest, abdomen and pelvis was performed following the standard protocol during bolus administration of intravenous contrast.  RADIATION DOSE REDUCTION: This exam was performed according to the departmental dose-optimization program which includes automated exposure control, adjustment of the mA and/or kV according to patient size and/or use of iterative reconstruction technique.  CONTRAST:  OMNIPAQUE  IOHEXOL  300 MG/ML  SOLN  COMPARISON:  Multiple priors including most recent CT June 25, 2024.  FINDINGS: CT CHEST FINDINGS  Cardiovascular: Right chest Port-A-Cath with tip near the superior cavoatrial junction. Normal size heart. No significant pericardial effusion/thickening.  Mediastinum/Nodes: No suspicious thyroid nodule.  Increased size of a subcarinal lymph node now measuring 14 mm in short axis on image 37/2 previously 9 mm.  Similar size of the additional supraclavicular, mediastinal and hilar lymph nodes. For reference:  -a right supraclavicular/low jugular lymph node measures 13 mm in short axis on image 6/2 previously 13 mm  -precarinal lymph node measures 14 mm in short axis on image 30/2 previously 12 mm  -right hilar lymph node measures 22 mm in short axis on image 34/2 previously 23 mm.  Lungs/Pleura: Bilateral metastatic pulmonary nodules and masses some of which are slightly  increased in size from prior examination. For reference:  -The left lower lobe mass previously indexed measures 7.4 x 4.8 cm on image 41/2 previously 7.4 x 4.5 cm when remeasured.  -right lower lobe lateral subpleural pulmonary nodule measures 16 mm on image 124/3 previously 12 mm.  -right lower lobe medial subpleural pulmonary nodule measures 2.7 x 0.9 cm on image 117/3, unchanged  Musculoskeletal: No aggressive lytic or blastic lesion of bone. Thoracic spondylosis. Surgical fixation screws in the bilateral shoulder girdle. Degenerative change of the bilateral glenohumeral joints.  CT ABDOMEN PELVIS FINDINGS  Hepatobiliary: No suspicious hepatic lesion. Subtle contour lobulations similar prior. Gallbladder is nondistended. No biliary ductal dilation.  Pancreas: No pancreatic ductal dilation or evidence of acute inflammation.  Spleen: Splenomegaly measuring 15.9 cm in maximum axial dimension on image 66/2.  Adrenals/Urinary Tract: Stable nodular thickening of the left adrenal gland measuring 13 mm on image 62/2, unchanged. Similar smooth thickening of the right adrenal gland.  No hydronephrosis. Kidneys demonstrate symmetric enhancement. Urinary bladder is unremarkable for degree of distension.  Stomach/Bowel: Radiopaque enteric contrast material traverses the rectum. Stomach is minimally distended. No pathologic dilation of small or large bowel.  Prior right hemicolectomy.  Vascular/Lymphatic: Normal caliber abdominal aorta. Smooth IVC contours. The portal, splenic and superior mesenteric veins are patent. Portosystemic collateral vessels.  No pathologically enlarged abdominal or pelvic lymph nodes.  Reproductive: Obscured by right hip arthroplasty.  Other: No significant abdominopelvic free fluid.  Musculoskeletal: No aggressive lytic or blastic lesion of bone. Lumbar spondylosis. Right hip arthroplasty.  IMPRESSION: 1. Slight interval increase in size of  bilateral metastatic pulmonary nodules and masses. 2. Increased size of a subcarinal lymph node with similar size of the additional supraclavicular, mediastinal and hilar lymph nodes. 3. Stable size of the left adrenal metastasis. 4. 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  Electronically Signed   By: Reyes Raymondo HERO.D.  On: 09/08/2024 13:47

## 2024-10-14 ENCOUNTER — Other Ambulatory Visit: Payer: Self-pay

## 2024-10-14 LAB — CEA: CEA: 199 ng/mL — ABNORMAL HIGH (ref 0.0–4.7)

## 2024-10-15 ENCOUNTER — Inpatient Hospital Stay

## 2024-10-15 VITALS — BP 128/81 | HR 75 | Temp 96.5°F | Resp 18

## 2024-10-15 DIAGNOSIS — C180 Malignant neoplasm of cecum: Secondary | ICD-10-CM

## 2024-10-15 DIAGNOSIS — Z95828 Presence of other vascular implants and grafts: Secondary | ICD-10-CM

## 2024-10-15 DIAGNOSIS — Z5112 Encounter for antineoplastic immunotherapy: Secondary | ICD-10-CM | POA: Diagnosis not present

## 2024-10-15 MED ORDER — PEGFILGRASTIM-CBQV 6 MG/0.6ML ~~LOC~~ SOSY
6.0000 mg | PREFILLED_SYRINGE | Freq: Once | SUBCUTANEOUS | Status: AC
  Administered 2024-10-15: 6 mg via SUBCUTANEOUS
  Filled 2024-10-15: qty 0.6

## 2024-10-15 NOTE — Progress Notes (Signed)
Patient presents today for 5FU pump stop and disconnection after 46 hour continous infusion.   5FU pump deaccessed.  Patients port flushed without difficulty.  Good blood return noted with no bruising or swelling noted at site.  Needle removed intact.  Band aid applied.  Stable during administration without incident; injection site WNL; see MAR for injection details.  Patient tolerated procedure well and without incident.  No questions or complaints noted at this time. VSS with discharge and left in satisfactory condition via wheelchair with no s/s of distress noted.

## 2024-10-15 NOTE — Patient Instructions (Signed)
 [facility] [place] - A DEPT OF [place]  Discharge Instructions: Thank you for choosing [facility] to provide your oncology and hematology care.  If you have a lab appointment with the Cancer Center - please note that after April 8th, 2024, all labs will be drawn in the cancer center.  You do not have to check in or register with the main entrance as you have in the past but will complete your check-in in the cancer center.  Wear comfortable clothing and clothing appropriate for easy access to any Portacath or PICC line.   We strive to give you quality time with your provider. You may need to reschedule your appointment if you arrive late (15 or more minutes).  Arriving late affects you and other patients whose appointments are after yours.  Also, if you miss three or more appointments without notifying the office, you may be dismissed from the clinic at the provider's discretion.      For prescription refill requests, have your pharmacy contact our office and allow 72 hours for refills to be completed.    Today you received the following chemotherapy and/or immunotherapy agents 5FU pump DC and Udenyca  injection      To help prevent nausea and vomiting after your treatment, we encourage you to take your nausea medication as directed.  BELOW ARE SYMPTOMS THAT SHOULD BE REPORTED IMMEDIATELY: *FEVER GREATER THAN 100.4 F (38 C) OR HIGHER *CHILLS OR SWEATING *NAUSEA AND VOMITING THAT IS NOT CONTROLLED WITH YOUR NAUSEA MEDICATION *UNUSUAL SHORTNESS OF BREATH *UNUSUAL BRUISING OR BLEEDING *URINARY PROBLEMS (pain or burning when urinating, or frequent urination) *BOWEL PROBLEMS (unusual diarrhea, constipation, pain near the anus) TENDERNESS IN MOUTH AND THROAT WITH OR WITHOUT PRESENCE OF ULCERS (sore throat, sores in mouth, or a toothache) UNUSUAL RASH, SWELLING OR PAIN  UNUSUAL VAGINAL DISCHARGE OR ITCHING   Items with * indicate a potential emergency  and should be followed up as soon as possible or go to the Emergency Department if any problems should occur.  Please show the CHEMOTHERAPY ALERT CARD or IMMUNOTHERAPY ALERT CARD at check-in to the Emergency Department and triage nurse.  Should you have questions after your visit or need to cancel or reschedule your appointment, please contact [facility] [place] - A DEPT OF [name] [phone]  and follow the prompts.  Office hours are 8:00 a.m. to 4:30 p.m. Monday - Friday. Please note that voicemails left after 4:00 p.m. may not be returned until the following business day.  We are closed weekends and major holidays. You have access to a nurse at all times for urgent questions. Please call the main number to the clinic [phone] and follow the prompts.  For any non-urgent questions, you may also contact your provider using MyChart. We now offer e-Visits for anyone 50 and older to request care online for non-urgent symptoms. For details visit [email or web address].   Also download the MyChart app! Go to the app store, search MyChart, open the app, select [facility], and log in with your MyChart username and password.

## 2024-10-27 ENCOUNTER — Inpatient Hospital Stay

## 2024-10-27 ENCOUNTER — Other Ambulatory Visit: Payer: Self-pay

## 2024-10-27 ENCOUNTER — Encounter: Payer: Self-pay | Admitting: Oncology

## 2024-10-27 ENCOUNTER — Inpatient Hospital Stay: Admitting: Dietician

## 2024-10-27 VITALS — BP 136/90 | HR 74 | Temp 97.8°F | Resp 18

## 2024-10-27 DIAGNOSIS — C180 Malignant neoplasm of cecum: Secondary | ICD-10-CM

## 2024-10-27 DIAGNOSIS — Z95828 Presence of other vascular implants and grafts: Secondary | ICD-10-CM

## 2024-10-27 DIAGNOSIS — Z5112 Encounter for antineoplastic immunotherapy: Secondary | ICD-10-CM | POA: Diagnosis not present

## 2024-10-27 LAB — COMPREHENSIVE METABOLIC PANEL WITH GFR
ALT: 9 U/L (ref 0–44)
AST: 29 U/L (ref 15–41)
Albumin: 3.6 g/dL (ref 3.5–5.0)
Alkaline Phosphatase: 136 U/L — ABNORMAL HIGH (ref 38–126)
Anion gap: 11 (ref 5–15)
BUN: 8 mg/dL (ref 8–23)
CO2: 23 mmol/L (ref 22–32)
Calcium: 8.4 mg/dL — ABNORMAL LOW (ref 8.9–10.3)
Chloride: 104 mmol/L (ref 98–111)
Creatinine, Ser: 0.9 mg/dL (ref 0.61–1.24)
GFR, Estimated: >60 mL/min (ref >60)
Glucose, Bld: 88 mg/dL (ref 70–99)
Potassium: 3.8 mmol/L (ref 3.5–5.1)
Sodium: 137 mmol/L (ref 135–145)
Total Bilirubin: 0.3 mg/dL (ref 0.0–1.2)
Total Protein: 6.7 g/dL (ref 6.5–8.1)

## 2024-10-27 LAB — CBC WITH DIFFERENTIAL/PLATELET
Abs Immature Granulocytes: 0.6 K/uL — ABNORMAL HIGH (ref 0.00–0.07)
Basophils Absolute: 0.2 K/uL — ABNORMAL HIGH (ref 0.0–0.1)
Basophils Relative: 1 %
Eosinophils Absolute: 0.3 K/uL (ref 0.0–0.5)
Eosinophils Relative: 2 %
HCT: 36 % — ABNORMAL LOW (ref 39.0–52.0)
Hemoglobin: 11.7 g/dL — ABNORMAL LOW (ref 13.0–17.0)
Lymphocytes Relative: 24 %
Lymphs Abs: 3.6 K/uL (ref 0.7–4.0)
MCH: 30.2 pg (ref 26.0–34.0)
MCHC: 32.5 g/dL (ref 30.0–36.0)
MCV: 92.8 fL (ref 80.0–100.0)
Metamyelocytes Relative: 4 %
Monocytes Absolute: 1.1 K/uL — ABNORMAL HIGH (ref 0.1–1.0)
Monocytes Relative: 7 %
Neutro Abs: 9.3 K/uL — ABNORMAL HIGH (ref 1.7–7.7)
Neutrophils Relative %: 62 %
Platelets: 160 K/uL (ref 150–400)
RBC: 3.88 MIL/uL — ABNORMAL LOW (ref 4.22–5.81)
RDW: 18.6 % — ABNORMAL HIGH (ref 11.5–15.5)
Smear Review: NORMAL
WBC: 15 K/uL — ABNORMAL HIGH (ref 4.0–10.5)
nRBC: 0 % (ref 0.0–0.2)

## 2024-10-27 LAB — URINALYSIS, DIPSTICK ONLY
Bilirubin Urine: NEGATIVE
Glucose, UA: NEGATIVE mg/dL
Hgb urine dipstick: NEGATIVE
Ketones, ur: NEGATIVE mg/dL
Leukocytes,Ua: NEGATIVE
Nitrite: NEGATIVE
Protein, ur: 100 mg/dL — AB
Specific Gravity, Urine: 1.016 (ref 1.005–1.030)
pH: 7 (ref 5.0–8.0)

## 2024-10-27 LAB — MAGNESIUM: Magnesium: 1.9 mg/dL (ref 1.7–2.4)

## 2024-10-27 MED ORDER — ATROPINE SULFATE 1 MG/ML IV SOLN
1.0000 mg | Freq: Once | INTRAVENOUS | Status: AC
  Administered 2024-10-27: 1 mg via INTRAVENOUS
  Filled 2024-10-27: qty 1

## 2024-10-27 MED ORDER — SODIUM CHLORIDE 0.9 % IV SOLN
5.0000 mg/kg | Freq: Once | INTRAVENOUS | Status: AC
  Administered 2024-10-27: 400 mg via INTRAVENOUS
  Filled 2024-10-27: qty 16

## 2024-10-27 MED ORDER — SODIUM CHLORIDE 0.9 % IV SOLN: Freq: Once | INTRAVENOUS | Status: AC

## 2024-10-27 MED ORDER — SODIUM CHLORIDE 0.9 % IV SOLN
3100.0000 mg | INTRAVENOUS | Status: DC
  Administered 2024-10-27: 3100 mg via INTRAVENOUS
  Filled 2024-10-27: qty 62

## 2024-10-27 MED ORDER — SODIUM CHLORIDE 0.9 % IV SOLN
144.0000 mg/m2 | Freq: Once | INTRAVENOUS | Status: AC
  Administered 2024-10-27: 300 mg via INTRAVENOUS
  Filled 2024-10-27: qty 15

## 2024-10-27 MED ORDER — DEXAMETHASONE SODIUM PHOSPHATE 10 MG/ML IJ SOLN
10.0000 mg | Freq: Once | INTRAMUSCULAR | Status: AC
  Administered 2024-10-27: 10 mg via INTRAVENOUS

## 2024-10-27 MED ORDER — SODIUM CHLORIDE 0.9 % IV SOLN
150.0000 mg | Freq: Once | INTRAVENOUS | Status: AC
  Administered 2024-10-27: 150 mg via INTRAVENOUS
  Filled 2024-10-27: qty 150

## 2024-10-27 MED ORDER — SODIUM CHLORIDE 0.9 % IV SOLN
400.0000 mg/m2 | Freq: Once | INTRAVENOUS | Status: AC
  Administered 2024-10-27: 780 mg via INTRAVENOUS
  Filled 2024-10-27: qty 39

## 2024-10-27 MED ORDER — FLUOROURACIL CHEMO INJECTION 2.5 GM/50ML
320.0000 mg/m2 | Freq: Once | INTRAVENOUS | Status: AC
  Administered 2024-10-27: 600 mg via INTRAVENOUS
  Filled 2024-10-27: qty 12

## 2024-10-27 MED ORDER — PALONOSETRON HCL INJECTION 0.25 MG/5ML
0.2500 mg | Freq: Once | INTRAVENOUS | Status: AC
  Administered 2024-10-27: 0.25 mg via INTRAVENOUS
  Filled 2024-10-27: qty 5

## 2024-10-27 NOTE — Progress Notes (Signed)
 Continue 5FU pump at 3100 mg per Dr Davonna Niels Molt, PharmD Clinical Pharmacist [facility]

## 2024-10-27 NOTE — Progress Notes (Signed)
 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 He is receiving dose reduced Folfiri + Beva q14d.   Met with patient in infusion. Reports persistent anorexia. Patient has megace  at home. Says it is on the kitchen table, but he forgets to take it. He did not eat yesterday. Patient did drink an Ensure this morning. He denies nausea, vomiting, diarrhea, constipation.    Medications: reviewed   Labs: reviewed   Anthropometrics: Wt 175 lb 3.2 oz today - stable   11/3 - 175 lb 0.7 oz  10/6 - 174 lb  9/8 - 176 lb 2.4 oz   NUTRITION DIAGNOSIS: Unintended wt loss - stable    INTERVENTION:  Encourage taking appetite stimulant as prescribed - suggested setting alarm on phone for reminder Continue drinking Ensure Plus/equivalent - recommend 2-3 daily  Encourage high calorie high protein snacks - pt politely declined RD offer for infusion snack/Ensure Support and encouragement     MONITORING, EVALUATION, GOAL: wt trends, intake   NEXT VISIT: Monday December 15 during infusion

## 2024-10-27 NOTE — Progress Notes (Signed)
 Patient presents today for chemotherapy MVASI /Folfiri infusion.  Patient is in satisfactory condition patient voiced complaints of chest pain when breathing in but no complains of dizziness or shortness of breath. Dr.Kandala made aware and stated to do an EGK. EGK results are normal okay to proceed with treatment today per Dr.Kandala Vital signs are stable.  Labs reviewed and all labs are within treatment parameters.  We will proceed with treatment per MD orders.    Treatment given today per MD orders. Tolerated infusion without adverse affects. Vital signs stable. No complaints at this time. Discharged from clinic ambulatory in stable condition. Alert and oriented x 3. F/U with [facility] as scheduled. 5FU ambulatory pump infusing with no alarms beeping.

## 2024-10-27 NOTE — Patient Instructions (Signed)
 [facility] [place] - A DEPT OF [facility]  Discharge Instructions: Thank you for choosing [facility] to provide your oncology and hematology care.  If you have a lab appointment with the Cancer Center - please note that after April 8th, 2024, all labs will be drawn in the cancer center.  You do not have to check in or register with the main entrance as you have in the past but will complete your check-in in the cancer center.  Wear comfortable clothing and clothing appropriate for easy access to any Portacath or PICC line.   We strive to give you quality time with your provider. You may need to reschedule your appointment if you arrive late (15 or more minutes).  Arriving late affects you and other patients whose appointments are after yours.  Also, if you miss three or more appointments without notifying the office, you may be dismissed from the clinic at the provider's discretion.      For prescription refill requests, have your pharmacy contact our office and allow 72 hours for refills to be completed.    Today you received the following chemotherapy and/or immunotherapy agents MVASI/Folfiri   To help prevent nausea and vomiting after your treatment, we encourage you to take your nausea medication as directed.   BELOW ARE SYMPTOMS THAT SHOULD BE REPORTED IMMEDIATELY: *FEVER GREATER THAN 100.4 F (38 C) OR HIGHER *CHILLS OR SWEATING *NAUSEA AND VOMITING THAT IS NOT CONTROLLED WITH YOUR NAUSEA MEDICATION *UNUSUAL SHORTNESS OF BREATH *UNUSUAL BRUISING OR BLEEDING *URINARY PROBLEMS (pain or burning when urinating, or frequent urination) *BOWEL PROBLEMS (unusual diarrhea, constipation, pain near the anus) TENDERNESS IN MOUTH AND THROAT WITH OR WITHOUT PRESENCE OF ULCERS (sore throat, sores in mouth, or a toothache) UNUSUAL RASH, SWELLING OR PAIN  UNUSUAL VAGINAL DISCHARGE OR ITCHING   Items with * indicate a potential emergency and should be followed  up as soon as possible or go to the Emergency Department if any problems should occur.  Please show the CHEMOTHERAPY ALERT CARD or IMMUNOTHERAPY ALERT CARD at check-in to the Emergency Department and triage nurse.  Should you have questions after your visit or need to cancel or reschedule your appointment, please contact [facility] [place] - A DEPT OF Eligha Bridegroom [facility] [phone]  and follow the prompts.  Office hours are 8:00 a.m. to 4:30 p.m. Monday - Friday. Please note that voicemails left after 4:00 p.m. may not be returned until the following business day.  We are closed weekends and major holidays. You have access to a nurse at all times for urgent questions. Please call the main number to the clinic [phone] and follow the prompts.  For any non-urgent questions, you may also contact your provider using MyChart. We now offer e-Visits for anyone 8 and older to request care online for non-urgent symptoms. For details visit [email or web address].   Also download the MyChart app! Go to the app store, search "MyChart", open the app, select [place], and log in with your MyChart username and password.

## 2024-10-28 LAB — CEA: CEA: 193 ng/mL — ABNORMAL HIGH (ref 0.0–4.7)

## 2024-10-29 ENCOUNTER — Inpatient Hospital Stay

## 2024-10-29 VITALS — BP 116/80 | HR 65 | Temp 98.0°F | Resp 16

## 2024-10-29 DIAGNOSIS — C180 Malignant neoplasm of cecum: Secondary | ICD-10-CM

## 2024-10-29 DIAGNOSIS — Z5112 Encounter for antineoplastic immunotherapy: Secondary | ICD-10-CM | POA: Diagnosis not present

## 2024-10-29 DIAGNOSIS — Z95828 Presence of other vascular implants and grafts: Secondary | ICD-10-CM

## 2024-10-29 MED ORDER — PEGFILGRASTIM-CBQV 6 MG/0.6ML ~~LOC~~ SOSY
6.0000 mg | PREFILLED_SYRINGE | Freq: Once | SUBCUTANEOUS | Status: AC
  Administered 2024-10-29: 6 mg via SUBCUTANEOUS

## 2024-10-29 NOTE — Progress Notes (Signed)
 Patient presents today for pump d/c. Vital signs are stable. Port a cath site clean, dry, and intact. Port flushed with 20 mls of Normal Saline. Needle removed intact. Band aid applied. Patient has no complaints at this time. Discharged from clinic ambulatory and in stable condition. Patient alert and oriented.

## 2024-10-29 NOTE — Patient Instructions (Signed)
 [facility] [place] - A DEPT OF [place]  Discharge Instructions: Thank you for choosing [facility] to provide your oncology and hematology care.  If you have a lab appointment with the Cancer Center - please note that after April 8th, 2024, all labs will be drawn in the cancer center.  You do not have to check in or register with the main entrance as you have in the past but will complete your check-in in the cancer center.  Wear comfortable clothing and clothing appropriate for easy access to any Portacath or PICC line.   We strive to give you quality time with your provider. You may need to reschedule your appointment if you arrive late (15 or more minutes).  Arriving late affects you and other patients whose appointments are after yours.  Also, if you miss three or more appointments without notifying the office, you may be dismissed from the clinic at the provider's discretion.      For prescription refill requests, have your pharmacy contact our office and allow 72 hours for refills to be completed.    Today you received the following chemotherapy and/or immunotherapy agents Udenyca  injection and Adrucil  5FU pump d/c'd. Fluorouracil  Injection What is this medication? FLUOROURACIL  (flure oh YOOR a sil) treats some types of cancer. It works by slowing down the growth of cancer cells. This medicine may be used for other purposes; ask your health care provider or pharmacist if you have questions. COMMON BRAND NAME(S): Adrucil  What should I tell my care team before I take this medication? They need to know if you have any of these conditions: Blood disorders Dihydropyrimidine dehydrogenase (DPD) deficiency Infection, such as chickenpox, cold sores, herpes Kidney disease Liver disease Poor nutrition Recent or ongoing radiation therapy An unusual or allergic reaction to fluorouracil , other medications, foods, dyes, or preservatives If you or your  partner are pregnant or trying to get pregnant Breast-feeding How should I use this medication? This medication is injected into a vein. It is administered by your care team in a hospital or clinic setting. Talk to your care team about the use of this medication in children. Special care may be needed. Overdosage: If you think you have taken too much of this medicine contact a poison control center or emergency room at once. NOTE: This medicine is only for you. Do not share this medicine with others. What if I miss a dose? Keep appointments for follow-up doses. It is important not to miss your dose. Call your care team if you are unable to keep an appointment. What may interact with this medication? Do not take this medication with any of the following: Live virus vaccines This medication may also interact with the following: Medications that treat or prevent blood clots, such as warfarin, enoxaparin, dalteparin This list may not describe all possible interactions. Give your health care provider a list of all the medicines, herbs, non-prescription drugs, or dietary supplements you use. Also tell them if you smoke, drink alcohol , or use illegal drugs. Some items may interact with your medicine. What should I watch for while using this medication? Your condition will be monitored carefully while you are receiving this medication. This medication may make you feel generally unwell. This is not uncommon as chemotherapy can affect healthy cells as well as cancer cells. Report any side effects. Continue your course of treatment even though you feel ill unless your care team tells you to stop. In some  cases, you may be given additional medications to help with side effects. Follow all directions for their use. This medication may increase your risk of getting an infection. Call your care team for advice if you get a fever, chills, sore throat, or other symptoms of a cold or flu. Do not treat yourself.  Try to avoid being around people who are sick. This medication may increase your risk to bruise or bleed. Call your care team if you notice any unusual bleeding. Be careful brushing or flossing your teeth or using a toothpick because you may get an infection or bleed more easily. If you have any dental work done, tell your dentist you are receiving this medication. Avoid taking medications that contain aspirin , acetaminophen , ibuprofen , naproxen, or ketoprofen unless instructed by your care team. These medications may hide a fever. Do not treat diarrhea with over the counter products. Contact your care team if you have diarrhea that lasts more than 2 days or if it is severe and watery. This medication can make you more sensitive to the sun. Keep out of the sun. If you cannot avoid being in the sun, wear protective clothing and sunscreen. Do not use sun lamps, tanning beds, or tanning booths. Talk to your care team if you or your partner wish to become pregnant or think you might be pregnant. This medication can cause serious birth defects if taken during pregnancy and for 3 months after the last dose. A reliable form of contraception is recommended while taking this medication and for 3 months after the last dose. Talk to your care team about effective forms of contraception. Do not father a child while taking this medication and for 3 months after the last dose. Use a condom while having sex during this time period. Do not breastfeed while taking this medication. This medication may cause infertility. Talk to your care team if you are concerned about your fertility. What side effects may I notice from receiving this medication? Side effects that you should report to your care team as soon as possible: Allergic reactions--skin rash, itching, hives, swelling of the face, lips, tongue, or throat Heart attack--pain or tightness in the chest, shoulders, arms, or jaw, nausea, shortness of breath, cold or  clammy skin, feeling faint or lightheaded Heart failure--shortness of breath, swelling of the ankles, feet, or hands, sudden weight gain, unusual weakness or fatigue Heart rhythm changes--fast or irregular heartbeat, dizziness, feeling faint or lightheaded, chest pain, trouble breathing High ammonia level--unusual weakness or fatigue, confusion, loss of appetite, nausea, vomiting, seizures Infection--fever, chills, cough, sore throat, wounds that don't heal, pain or trouble when passing urine, general feeling of discomfort or being unwell Low red blood cell level--unusual weakness or fatigue, dizziness, headache, trouble breathing Pain, tingling, or numbness in the hands or feet, muscle weakness, change in vision, confusion or trouble speaking, loss of balance or coordination, trouble walking, seizures Redness, swelling, and blistering of the skin over hands and feet Severe or prolonged diarrhea Unusual bruising or bleeding Side effects that usually do not require medical attention (report to your care team if they continue or are bothersome): Dry skin Headache Increased tears Nausea Pain, redness, or swelling with sores inside the mouth or throat Sensitivity to light Vomiting This list may not describe all possible side effects. Call your doctor for medical advice about side effects. You may report side effects to FDA at 1-800-FDA-1088. Where should I keep my medication? This medication is given in a hospital or clinic. It  will not be stored at home. NOTE: This sheet is a summary. It may not cover all possible information. If you have questions about this medicine, talk to your doctor, pharmacist, or health care provider.  2024 Elsevier/Gold Standard (2022-04-04 00:00:00)Pegfilgrastim  Injection What is this medication? PEGFILGRASTIM  (PEG fil gra stim) lowers the risk of infection in people who are receiving chemotherapy. It works by systems analyst make more white blood cells, which  protects your body from infection. It may also be used to help people who have been exposed to high doses of radiation. This medicine may be used for other purposes; ask your health care provider or pharmacist if you have questions. COMMON BRAND NAME(S): Fulphila , Fylnetra , Neulasta , Nyvepria , Stimufend , UDENYCA , UDENYCA  ONBODY, Ziextenzo  What should I tell my care team before I take this medication? They need to know if you have any of these conditions: Kidney disease Latex allergy Ongoing radiation therapy Sickle cell disease Skin reactions to acrylic adhesives (On-Body Injector only) An unusual or allergic reaction to pegfilgrastim , filgrastim , other medications, foods, dyes, or preservatives Pregnant or trying to get pregnant Breast-feeding How should I use this medication? This medication is for injection under the skin. If you get this medication at home, you will be taught how to prepare and give the pre-filled syringe or how to use the On-body Injector. Refer to the patient Instructions for Use for detailed instructions. Use exactly as directed. Tell your care team immediately if you suspect that the On-body Injector may not have performed as intended or if you suspect the use of the On-body Injector resulted in a missed or partial dose. It is important that you put your used needles and syringes in a special sharps container. Do not put them in a trash can. If you do not have a sharps container, call your pharmacist or care team to get one. Talk to your care team about the use of this medication in children. While this medication may be prescribed for selected conditions, precautions do apply. Overdosage: If you think you have taken too much of this medicine contact a poison control center or emergency room at once. NOTE: This medicine is only for you. Do not share this medicine with others. What if I miss a dose? It is important not to miss your dose. Call your care team if you miss  your dose. If you miss a dose due to an On-body Injector failure or leakage, a new dose should be administered as soon as possible using a single prefilled syringe for manual use. What may interact with this medication? Interactions have not been studied. This list may not describe all possible interactions. Give your health care provider a list of all the medicines, herbs, non-prescription drugs, or dietary supplements you use. Also tell them if you smoke, drink alcohol , or use illegal drugs. Some items may interact with your medicine. What should I watch for while using this medication? Your condition will be monitored carefully while you are receiving this medication. You may need blood work done while you are taking this medication. Talk to your care team about your risk of cancer. You may be more at risk for certain types of cancer if you take this medication. If you are going to need a MRI, CT scan, or other procedure, tell your care team that you are using this medication (On-Body Injector only). What side effects may I notice from receiving this medication? Side effects that you should report to your care team as soon  as possible: Allergic reactions--skin rash, itching, hives, swelling of the face, lips, tongue, or throat Capillary leak syndrome--stomach or muscle pain, unusual weakness or fatigue, feeling faint or lightheaded, decrease in the amount of urine, swelling of the ankles, hands, or feet, trouble breathing High white blood cell level--fever, fatigue, trouble breathing, night sweats, change in vision, weight loss Inflammation of the aorta--fever, fatigue, back, chest, or stomach pain, severe headache Kidney injury (glomerulonephritis)--decrease in the amount of urine, red or dark brown urine, foamy or bubbly urine, swelling of the ankles, hands, or feet Shortness of breath or trouble breathing Spleen injury--pain in upper left stomach or shoulder Unusual bruising or bleeding Side  effects that usually do not require medical attention (report to your care team if they continue or are bothersome): Bone pain Pain in the hands or feet This list may not describe all possible side effects. Call your doctor for medical advice about side effects. You may report side effects to FDA at 1-800-FDA-1088. Where should I keep my medication? Keep out of the reach of children. If you are using this medication at home, you will be instructed on how to store it. Throw away any unused medication after the expiration date on the label. NOTE: This sheet is a summary. It may not cover all possible information. If you have questions about this medicine, talk to your doctor, pharmacist, or health care provider.  2024 Elsevier/Gold Standard (2021-10-28 00:00:00)      To help prevent nausea and vomiting after your treatment, we encourage you to take your nausea medication as directed.  BELOW ARE SYMPTOMS THAT SHOULD BE REPORTED IMMEDIATELY: *FEVER GREATER THAN 100.4 F (38 C) OR HIGHER *CHILLS OR SWEATING *NAUSEA AND VOMITING THAT IS NOT CONTROLLED WITH YOUR NAUSEA MEDICATION *UNUSUAL SHORTNESS OF BREATH *UNUSUAL BRUISING OR BLEEDING *URINARY PROBLEMS (pain or burning when urinating, or frequent urination) *BOWEL PROBLEMS (unusual diarrhea, constipation, pain near the anus) TENDERNESS IN MOUTH AND THROAT WITH OR WITHOUT PRESENCE OF ULCERS (sore throat, sores in mouth, or a toothache) UNUSUAL RASH, SWELLING OR PAIN  UNUSUAL VAGINAL DISCHARGE OR ITCHING   Items with * indicate a potential emergency and should be followed up as soon as possible or go to the Emergency Department if any problems should occur.  Please show the CHEMOTHERAPY ALERT CARD or IMMUNOTHERAPY ALERT CARD at check-in to the Emergency Department and triage nurse.  Should you have questions after your visit or need to cancel or reschedule your appointment, please contact [facility] [place] - A DEPT OF [name] [phone]  and follow the prompts.  Office hours are 8:00 a.m. to 4:30 p.m. Monday - Friday. Please note that voicemails left after 4:00 p.m. may not be returned until the following business day.  We are closed weekends and major holidays. You have access to a nurse at all times for urgent questions. Please call the main number to the clinic [phone] and follow the prompts.  For any non-urgent questions, you may also contact your provider using MyChart. We now offer e-Visits for anyone 19 and older to request care online for non-urgent symptoms. For details visit [email or web address].   Also download the MyChart app! Go to the app store, search MyChart, open the app, select [place], and log in with your MyChart username and password.

## 2024-10-30 ENCOUNTER — Telehealth: Payer: Self-pay | Admitting: *Deleted

## 2024-11-04 ENCOUNTER — Other Ambulatory Visit: Payer: Self-pay | Admitting: Oncology

## 2024-11-04 DIAGNOSIS — C180 Malignant neoplasm of cecum: Secondary | ICD-10-CM

## 2024-11-04 DIAGNOSIS — Z95828 Presence of other vascular implants and grafts: Secondary | ICD-10-CM

## 2024-11-11 ENCOUNTER — Inpatient Hospital Stay: Attending: Hematology

## 2024-11-11 ENCOUNTER — Other Ambulatory Visit: Payer: Self-pay | Admitting: Oncology

## 2024-11-11 ENCOUNTER — Inpatient Hospital Stay (HOSPITAL_BASED_OUTPATIENT_CLINIC_OR_DEPARTMENT_OTHER): Admitting: Oncology

## 2024-11-11 ENCOUNTER — Inpatient Hospital Stay

## 2024-11-11 VITALS — BP 145/89 | HR 76 | Temp 97.5°F | Resp 18

## 2024-11-11 DIAGNOSIS — Z79899 Other long term (current) drug therapy: Secondary | ICD-10-CM | POA: Insufficient documentation

## 2024-11-11 DIAGNOSIS — C180 Malignant neoplasm of cecum: Secondary | ICD-10-CM

## 2024-11-11 DIAGNOSIS — C7801 Secondary malignant neoplasm of right lung: Secondary | ICD-10-CM | POA: Diagnosis not present

## 2024-11-11 DIAGNOSIS — C782 Secondary malignant neoplasm of pleura: Secondary | ICD-10-CM | POA: Insufficient documentation

## 2024-11-11 DIAGNOSIS — G8929 Other chronic pain: Secondary | ICD-10-CM

## 2024-11-11 DIAGNOSIS — Z5189 Encounter for other specified aftercare: Secondary | ICD-10-CM | POA: Insufficient documentation

## 2024-11-11 DIAGNOSIS — F17210 Nicotine dependence, cigarettes, uncomplicated: Secondary | ICD-10-CM | POA: Insufficient documentation

## 2024-11-11 DIAGNOSIS — D649 Anemia, unspecified: Secondary | ICD-10-CM

## 2024-11-11 DIAGNOSIS — G4709 Other insomnia: Secondary | ICD-10-CM

## 2024-11-11 DIAGNOSIS — Z95828 Presence of other vascular implants and grafts: Secondary | ICD-10-CM

## 2024-11-11 DIAGNOSIS — C7972 Secondary malignant neoplasm of left adrenal gland: Secondary | ICD-10-CM | POA: Diagnosis not present

## 2024-11-11 DIAGNOSIS — R197 Diarrhea, unspecified: Secondary | ICD-10-CM

## 2024-11-11 DIAGNOSIS — C7802 Secondary malignant neoplasm of left lung: Secondary | ICD-10-CM | POA: Diagnosis not present

## 2024-11-11 DIAGNOSIS — Z5111 Encounter for antineoplastic chemotherapy: Secondary | ICD-10-CM | POA: Diagnosis present

## 2024-11-11 DIAGNOSIS — Z5112 Encounter for antineoplastic immunotherapy: Secondary | ICD-10-CM | POA: Diagnosis present

## 2024-11-11 DIAGNOSIS — Z720 Tobacco use: Secondary | ICD-10-CM

## 2024-11-11 LAB — CBC WITH DIFFERENTIAL/PLATELET
Abs Immature Granulocytes: 0.77 K/uL — ABNORMAL HIGH (ref 0.00–0.07)
Basophils Absolute: 0.2 K/uL — ABNORMAL HIGH (ref 0.0–0.1)
Basophils Relative: 1 %
Eosinophils Absolute: 0.3 K/uL (ref 0.0–0.5)
Eosinophils Relative: 2 %
HCT: 35.2 % — ABNORMAL LOW (ref 39.0–52.0)
Hemoglobin: 11.1 g/dL — ABNORMAL LOW (ref 13.0–17.0)
Immature Granulocytes: 6 %
Lymphocytes Relative: 17 %
Lymphs Abs: 2.1 K/uL (ref 0.7–4.0)
MCH: 29.3 pg (ref 26.0–34.0)
MCHC: 31.5 g/dL (ref 30.0–36.0)
MCV: 92.9 fL (ref 80.0–100.0)
Monocytes Absolute: 1.4 K/uL — ABNORMAL HIGH (ref 0.1–1.0)
Monocytes Relative: 11 %
Neutro Abs: 8.2 K/uL — ABNORMAL HIGH (ref 1.7–7.7)
Neutrophils Relative %: 63 %
Platelets: 219 K/uL (ref 150–400)
RBC: 3.79 MIL/uL — ABNORMAL LOW (ref 4.22–5.81)
RDW: 19.2 % — ABNORMAL HIGH (ref 11.5–15.5)
Smear Review: NORMAL
WBC: 13 K/uL — ABNORMAL HIGH (ref 4.0–10.5)
nRBC: 0 % (ref 0.0–0.2)

## 2024-11-11 LAB — COMPREHENSIVE METABOLIC PANEL WITH GFR
ALT: 10 U/L (ref 0–44)
AST: 33 U/L (ref 15–41)
Albumin: 3.8 g/dL (ref 3.5–5.0)
Alkaline Phosphatase: 134 U/L — ABNORMAL HIGH (ref 38–126)
Anion gap: 13 (ref 5–15)
BUN: 16 mg/dL (ref 8–23)
CO2: 19 mmol/L — ABNORMAL LOW (ref 22–32)
Calcium: 8.7 mg/dL — ABNORMAL LOW (ref 8.9–10.3)
Chloride: 107 mmol/L (ref 98–111)
Creatinine, Ser: 0.96 mg/dL (ref 0.61–1.24)
GFR, Estimated: >60 mL/min (ref >60)
Glucose, Bld: 86 mg/dL (ref 70–99)
Potassium: 4.1 mmol/L (ref 3.5–5.1)
Sodium: 140 mmol/L (ref 135–145)
Total Bilirubin: 0.3 mg/dL (ref 0.0–1.2)
Total Protein: 7.1 g/dL (ref 6.5–8.1)

## 2024-11-11 LAB — URINALYSIS, DIPSTICK ONLY
Bilirubin Urine: NEGATIVE
Glucose, UA: NEGATIVE mg/dL
Hgb urine dipstick: NEGATIVE
Ketones, ur: NEGATIVE mg/dL
Leukocytes,Ua: NEGATIVE
Nitrite: NEGATIVE
Protein, ur: 100 mg/dL — AB
Specific Gravity, Urine: 1.018 (ref 1.005–1.030)
pH: 6 (ref 5.0–8.0)

## 2024-11-11 LAB — MAGNESIUM: Magnesium: 2.3 mg/dL (ref 1.7–2.4)

## 2024-11-11 MED ORDER — SODIUM CHLORIDE 0.9 % IV SOLN
400.0000 mg/m2 | Freq: Once | INTRAVENOUS | Status: AC
  Administered 2024-11-11: 780 mg via INTRAVENOUS
  Filled 2024-11-11: qty 39

## 2024-11-11 MED ORDER — SODIUM CHLORIDE 0.9 % IV SOLN
150.0000 mg | Freq: Once | INTRAVENOUS | Status: AC
  Administered 2024-11-11: 150 mg via INTRAVENOUS
  Filled 2024-11-11: qty 150

## 2024-11-11 MED ORDER — FLUOROURACIL CHEMO INJECTION 2.5 GM/50ML
320.0000 mg/m2 | Freq: Once | INTRAVENOUS | Status: AC
  Administered 2024-11-11: 600 mg via INTRAVENOUS
  Filled 2024-11-11: qty 12

## 2024-11-11 MED ORDER — SODIUM CHLORIDE 0.9 % IV SOLN
3100.0000 mg | INTRAVENOUS | Status: DC
  Administered 2024-11-11: 3100 mg via INTRAVENOUS
  Filled 2024-11-11: qty 62

## 2024-11-11 MED ORDER — SODIUM CHLORIDE 0.9 % IV SOLN: Freq: Once | INTRAVENOUS | Status: AC

## 2024-11-11 MED ORDER — ATROPINE SULFATE 1 MG/ML IV SOLN
1.0000 mg | Freq: Once | INTRAVENOUS | Status: AC
  Administered 2024-11-11: 1 mg via INTRAVENOUS
  Filled 2024-11-11: qty 1

## 2024-11-11 MED ORDER — SODIUM CHLORIDE 0.9 % IV SOLN
5.0000 mg/kg | Freq: Once | INTRAVENOUS | Status: AC
  Administered 2024-11-11: 400 mg via INTRAVENOUS
  Filled 2024-11-11: qty 16

## 2024-11-11 MED ORDER — DEXAMETHASONE SODIUM PHOSPHATE 10 MG/ML IJ SOLN
10.0000 mg | Freq: Once | INTRAMUSCULAR | Status: AC
  Administered 2024-11-11: 10 mg via INTRAVENOUS

## 2024-11-11 MED ORDER — SODIUM CHLORIDE 0.9 % IV SOLN
144.0000 mg/m2 | Freq: Once | INTRAVENOUS | Status: AC
  Administered 2024-11-11: 300 mg via INTRAVENOUS
  Filled 2024-11-11: qty 15

## 2024-11-11 MED ORDER — PALONOSETRON HCL INJECTION 0.25 MG/5ML
0.2500 mg | Freq: Once | INTRAVENOUS | Status: AC
  Administered 2024-11-11: 0.25 mg via INTRAVENOUS
  Filled 2024-11-11: qty 5

## 2024-11-11 NOTE — Progress Notes (Signed)
 Patient presents today for MVASI /Folfiri infusion with 5FU pump start per providers order.  Labs and vital signs reviewed by MD.  Message received from Dr. Davonna patient okay for treatment.  Treatment given today per MD orders.  Stable during infusion without adverse affects.  Vital signs stable.  No complaints at this time.  5FU pump connected and verified RUN on the screen with the patient.   Discharge from clinic ambulatory in stable condition.  Alert and oriented X 3.  Follow up with [facility] as scheduled.

## 2024-11-11 NOTE — Progress Notes (Signed)
 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 Extensive oncology history below. Patient currently on FOLFIRI and bevacizumab (1st line) and tolerating well.  5-FU and irinotecan  dose reduced for mucositis.  -Labs reviewed today: CMP: Creatinine: Normal, normal LFTs.  CBC: WBC: 13, hemoglobin: 11.1, platelets: 219.  CEA pending from today. -Last CT scan with some enlarging lymph nodes.  CEA trending down.  Will repeat CT scan in 3 months I.e due in 11/2024.  Will order today. - Patient did not receive oxaliplatin before.  Will consider FOLFOX plus bevacizumab  on progression. -UA with 2+ proteinuria.  Previous 24-hour urine protein is 360. -Physical exam stable today.  Proceed with treatment today. - Continue chemotherapy every 2 weeks.  Return to clinic in 4 weeks for follow-up and CT scan.  Diarrhea secondary to cancer treatment Recurrent diarrhea managed with Imodium .  - Advised to continue use of Imodium  as needed for diarrhea.  Insomnia Chronic insomnia. Ativan  ineffective. Screen time and late meals possible factors.  - Improved sleep hygiene - Has Ativan  available as needed.  Patient is not taking it.  Chronic obstructive pulmonary disease (COPD) Mild wheezing likely due to smoking. No inhaler use.  - Advise smoking cessation to improve respiratory symptoms.  Nicotine dependence, current smoker Resumed smoking 1-2 cigarettes per day after quitting.  - Advise smoking cessation.  Mucositis Improved after dose  reducing chemotherapy  -Continue to use Magic mouthwash as needed  Anemia Likely multifactorial-iron deficiency, anemia of chronic disease, immunosuppression from chemotherapy.  Last nutritional panel did not show iron deficiency  - Need to monitor for now.  Chronic pain - Continue oxycodone  10 mg every 4 hours as needed   The patient understands the plans discussed today and is in agreement with them.  He knows to contact our office if he develops concerns prior to his next appointment.  22 minutes of total time was spent for this patient encounter, including preparation,review of records,  face-to-face counseling with the patient and coordination of care, physical exam, and documentation of the encounter.    LILLETTE Verneta SAUNDERS Teague,acting as a neurosurgeon for [name], MD.,have documented all relevant documentation on the behalf of [name], MD,as directed by  [name], MD while in the presence of [name], MD.  I, [name] MD, have reviewed the above documentation for accuracy and completeness, and I agree with the above.    Verneta SAUNDERS Ege  [facility] [place] - A DEPT OF [name] [address] [place] [address] Dept: [phone] Dept Fax: [phone]   No orders of the defined types were placed in this encounter.    ONCOLOGY HISTORY:   I have reviewed his chart and materials related to his cancer extensively and collaborated history with the patient. Summary of oncologic history is as follows:   Diagnosis: Metastatic colon cancer to lungs and adrenal gland  -Presentation: Dry cough for 6 months, 30 pound weight loss in 2 years -12/16/2021: CT chest with contrast: Bulky left  hilar mass centered about the superior segment left lower lobe, measuring 8.1 x 7.5 cm. Numerous bilateral pulmonary masses and nodules of varying sizes. Numerous bulky mediastinal and bilateral hilar lymph  nodes. New left adrenal nodule. -12/26/2021: CEA: 15.6 -01/03/2022: CT abdomen and pelvis: Isolated left adrenal metastasis, as on chest CT. No other evidence of metastatic disease within the abdomen or pelvis. -01/03/2022: MRI brain: No evidence of intracranial metastatic disease. -01/17/2022: Left lung core needle biopsy: Adenocarcinoma with associated necrosis  -By immunohistochemistry, the neoplastic cells are positive for CK20 and CDX2 but negative for cytokeratin 7.   -The morphology and immunophenotype are consistent with a primary colonic adenocarcinoma.  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  -09/12/2023: Added bevacizumab  secondary to progression  -5-FU and irinotecan  dose reduced for mucositis -05/17/2022: CT CAP: Mediastinal and hilar lymph nodes have decreased in size. Largest perihilar left lower lobe lung mass has decreased in size. Right upper lobe mass slightly increased in size. Other nodules are stable. Left adrenal mass decreased to 1.3 cm from 2.8 cm.  -07/2022-01/2023: Stable findings on CT CAP -09/05/2023: CT CAP: Increased size of dominant left lower lobe pulmonary nodule with new central occlusion of the superior left upper lobe segmental bronchus. Additional bilateral irregular solid pulmonary nodules are stable or slightly increased in size. Increased size of right hilar lymph node. Stable pleural metastatic disease. Stable left adrenal gland nodule. - 11/07/2022: CEA: 62.6 - 10/29/2023: CEA: 159 - 12/2023-06/2024: Stable findings on CT CAP -07/28/2024: CEA: 211 - 09/02/2024: CEA: 195 - 09/08/2024: CT CAP: Slight interval increase in size of bilateral  metastatic pulmonary nodules and masses (12 to 16 mm). Increased size of a subcarinal lymph node(9 mm to 14 mm) with similar size of the additional supraclavicular, mediastinal and hilar lymph nodes. Stable size of the left adrenal metastasis.   Current Treatment:  FOLFIRI + Bevacizumab   INTERVAL HISTORY:  Evan [name] is a 68 y.o. male presenting to the clinic today for follow-up of metastatic colon cancer and for chemotherapy. Patient is unaccompanied today.   Taevyn reports he is doing well overall. He denies any nausea or vomiting. Sathvik states he recently ran out of oxycodone  10 mg for chronic pain, which he is taking 4 times a day.   Asar notes he has diarrhea after chemotherapy that is well controlled with Imodium . His sleeping has improved after changing sleep hygiene and is not requiring Ativan  at this time.    He has started smoking again.   I have reviewed the past medical history, past surgical history, social history and family history with the patient and they are unchanged from previous note.  ALLERGIES:  has no known allergies.  MEDICATIONS:  Current Outpatient Medications  Medication Sig Dispense Refill   aluminum -magnesium  hydroxide-simethicone  (MAALOX) 200-200-20 MG/5ML SUSP Take 30 mLs by mouth 4 (four) times daily -  before meals and at bedtime. 1680 mL 2   amLODipine  (NORVASC ) 10 MG tablet Take 1 tablet (10 mg total) by mouth daily. 90 tablet 3   Bevacizumab  (AVASTIN  IV) Inject into the vein every 14 (fourteen) days. *start date TBD     cyclobenzaprine  (FLEXERIL ) 10 MG tablet Take 1 tablet (10 mg total) by mouth at bedtime. 5  tablet 0   dexamethasone  (DECADRON ) 0.5 MG/5ML solution 10ml swish and spit twice daily for 5 days after each chemo treatment 240 mL 3   ENULOSE  10 GM/15ML SOLN Take 10 g by mouth daily as needed.     fluorouracil  CALGB 19297 2,400 mg/m2 in sodium chloride  0.9 % 150 mL Inject 2,400 mg/m2 into the vein over 48 hr.     FLUOROURACIL  IV  Inject into the vein every 14 (fourteen) days.     Lactulose  20 GM/30ML SOLN Take 15 mLs (10 g total) by mouth at bedtime. Take 15 ml at bedtime every night to assist with regular bowel movements.  Titrate down if having multiple bowel movements.  If a bowel movement has not occurred in 3 to 4 days or longer, then take 15 ml every 3 hours until a bowel movent has occurred. 450 mL 5   LEUCOVORIN  CALCIUM  IV Inject into the vein every 14 (fourteen) days.     lidocaine -prilocaine  (EMLA ) cream Apply 1 Application topically as needed (Apply to port site 30 mins- 1 hour prior to treatment/labs.). 30 g 0   lisinopril  (ZESTRIL ) 10 MG tablet TAKE ONE TABLET BY MOUTH DAILY 30 tablet 3   loperamide  (IMODIUM ) 2 MG capsule TAKE 2 CAPSULES BY MOUTH AFTER FIRST LOOSE STOOL,AND THEN 1 CAPSULE AFTER 2ND LOOSE STOOL. DO NOT EXCEED 8 CAPS IN 24 HOURS 30 capsule 0   LORazepam  (ATIVAN ) 0.5 MG tablet Take 1 tablet (0.5 mg total) by mouth at bedtime. 30 tablet 2   magic mouthwash w/lidocaine  SOLN Take 5 mLs by mouth 4 (four) times daily as needed for mouth pain. 250 mL 1   megestrol  (MEGACE ) 400 MG/10ML suspension Take 10 mLs (400 mg total) by mouth 2 (two) times daily. 480 mL 3   Melatonin 5 MG CAPS Take 1 capsule (5 mg total) by mouth at bedtime. Take one capsule by mouth every evening at 7 pm 30 capsule 3   Misc. Devices MISC Please provide patient with 1:1 ratio of magic mouthwash and lidocaine  to swish and swallow QID 1 each 3   naloxone  (NARCAN ) nasal spray 4 mg/0.1 mL 0.1ml nasal spray in one nostril. May repeat the dose in other nostril in 2-3 minutes if needed. 2 each 3   Oxycodone  HCl 10 MG TABS Take 1 tablet (10 mg total) by mouth every 4 (four) hours as needed. 180 tablet 0   pantoprazole  (PROTONIX ) 40 MG tablet Take 1 tablet (40 mg total) by mouth daily. 30 tablet 11   potassium chloride  SA (KLOR-CON  M) 20 MEQ tablet Take 1 tablet (20 mEq total) by mouth daily. 90 tablet 4   potassium chloride  SA (KLOR-CON  M) 20  MEQ tablet Take 1 tablet (20 mEq total) by mouth 2 (two) times daily. 14 tablet 0   prochlorperazine  (COMPAZINE ) 10 MG tablet Take 1 tablet (10 mg total) by mouth every 6 (six) hours as needed (NAUSEA). 60 tablet 2   No current facility-administered medications for this visit.   Facility-Administered Medications Ordered in Other Visits  Medication Dose Route Frequency Provider Last Rate Last Admin   potassium chloride  SA (KLOR-CON  M) CR tablet 40 mEq  40 mEq Oral Once Katragadda, Sreedhar, MD        VITALS:  There were no vitals taken for this visit.  Wt Readings from Last 3 Encounters:  11/11/24 178 lb 8 oz (81 kg)  10/27/24 175 lb 3.2 oz (79.5 kg)  10/13/24 175 lb 0.7 oz (79.4 kg)  There is no height or weight on file to calculate BMI.  Performance status (ECOG): 1 - Symptomatic but completely ambulatory  PHYSICAL EXAM:   GENERAL:alert, no distress and comfortable SKIN: skin color, texture, turgor are normal, no rashes or significant lesions LYMPH:  no palpable lymphadenopathy in the cervical, axillary or inguinal LUNGS: clear to auscultation and percussion with normal breathing effort HEART: regular rate & rhythm and no murmurs and no lower extremity edema ABDOMEN:abdomen soft, non-tender and normal bowel sounds Musculoskeletal:no cyanosis of digits and no clubbing  NEURO: alert & oriented x 3 with fluent speech  LABORATORY DATA:  I have reviewed the data as listed   Lab Results  Component Value Date   WBC 15.0 (H) 10/27/2024   NEUTROABS 9.3 (H) 10/27/2024   HGB 11.7 (L) 10/27/2024   HCT 36.0 (L) 10/27/2024   MCV 92.8 10/27/2024   PLT 160 10/27/2024      Chemistry      Component Value Date/Time   NA 137 10/27/2024 0755   K 3.8 10/27/2024 0755   CL 104 10/27/2024 0755   CO2 23 10/27/2024 0755   BUN 8 10/27/2024 0755   CREATININE 0.90 10/27/2024 0755      Component Value Date/Time   CALCIUM  8.4 (L) 10/27/2024 0755   ALKPHOS 136 (H) 10/27/2024 0755   AST  29 10/27/2024 0755   ALT 9 10/27/2024 0755   BILITOT 0.3 10/27/2024 0755        Latest Reference Range & Units 11/11/24 08:10  Appearance CLEAR  CLEAR  Bilirubin Urine NEGATIVE  NEGATIVE  Color, Urine YELLOW  YELLOW  Glucose, UA NEGATIVE mg/dL NEGATIVE  Hgb urine dipstick NEGATIVE  NEGATIVE  Ketones, ur NEGATIVE mg/dL NEGATIVE  Leukocytes,Ua NEGATIVE  NEGATIVE  Nitrite NEGATIVE  NEGATIVE  pH 5.0 - 8.0  6.0  Protein NEGATIVE mg/dL 899 !  Specific Gravity, Urine 1.005 - 1.030  1.018  !: Data is abnormal   Latest Reference Range & Units 08/21/24 10:31  Urine Total Volume-UPROT mL 1,000  Collection Interval-UPROT hours 24  Protein, Urine mg/dL 36  Protein, 75Y Urine 50 - 100 mg/day 360 (H)  (H): Data is abnormally high  RADIOGRAPHIC STUDIES: I have personally reviewed the radiological images as listed and agreed with the findings in the report.

## 2024-11-11 NOTE — Patient Instructions (Signed)
 [facility] [place] - A DEPT OF [facility]  Discharge Instructions: Thank you for choosing [facility] to provide your oncology and hematology care.  If you have a lab appointment with the Cancer Center - please note that after April 8th, 2024, all labs will be drawn in the cancer center.  You do not have to check in or register with the main entrance as you have in the past but will complete your check-in in the cancer center.  Wear comfortable clothing and clothing appropriate for easy access to any Portacath or PICC line.   We strive to give you quality time with your provider. You may need to reschedule your appointment if you arrive late (15 or more minutes).  Arriving late affects you and other patients whose appointments are after yours.  Also, if you miss three or more appointments without notifying the office, you may be dismissed from the clinic at the provider's discretion.      For prescription refill requests, have your pharmacy contact our office and allow 72 hours for refills to be completed.    Today you received the following chemotherapy and/or immunotherapy agents MVASI /Folfiri/5FU      To help prevent nausea and vomiting after your treatment, we encourage you to take your nausea medication as directed.  BELOW ARE SYMPTOMS THAT SHOULD BE REPORTED IMMEDIATELY: *FEVER GREATER THAN 100.4 F (38 C) OR HIGHER *CHILLS OR SWEATING *NAUSEA AND VOMITING THAT IS NOT CONTROLLED WITH YOUR NAUSEA MEDICATION *UNUSUAL SHORTNESS OF BREATH *UNUSUAL BRUISING OR BLEEDING *URINARY PROBLEMS (pain or burning when urinating, or frequent urination) *BOWEL PROBLEMS (unusual diarrhea, constipation, pain near the anus) TENDERNESS IN MOUTH AND THROAT WITH OR WITHOUT PRESENCE OF ULCERS (sore throat, sores in mouth, or a toothache) UNUSUAL RASH, SWELLING OR PAIN  UNUSUAL VAGINAL DISCHARGE OR ITCHING   Items with * indicate a potential emergency and should be  followed up as soon as possible or go to the Emergency Department if any problems should occur.  Please show the CHEMOTHERAPY ALERT CARD or IMMUNOTHERAPY ALERT CARD at check-in to the Emergency Department and triage nurse.  Should you have questions after your visit or need to cancel or reschedule your appointment, please contact [facility] [place] - A DEPT OF [name] [phone]  and follow the prompts.  Office hours are 8:00 a.m. to 4:30 p.m. Monday - Friday. Please note that voicemails left after 4:00 p.m. may not be returned until the following business day.  We are closed weekends and major holidays. You have access to a nurse at all times for urgent questions. Please call the main number to the clinic [phone] and follow the prompts.  For any non-urgent questions, you may also contact your provider using MyChart. We now offer e-Visits for anyone 55 and older to request care online for non-urgent symptoms. For details visit [email or web address].   Also download the MyChart app! Go to the app store, search MyChart, open the app, select , and log in with your MyChart username and password.

## 2024-11-11 NOTE — Progress Notes (Signed)
 Per Dr Davonna - received ok to proceed with urine protein 100.  Previous 24 hour protein was 360  Continue 5FU pump at 3100 mg per Dr Davonna - prior mucositis   Niels Molt, PharmD Clinical Pharmacist [facility]

## 2024-11-12 LAB — CEA: CEA: 203 ng/mL — ABNORMAL HIGH (ref 0.0–4.7)

## 2024-11-13 ENCOUNTER — Inpatient Hospital Stay

## 2024-11-13 ENCOUNTER — Other Ambulatory Visit: Payer: Self-pay | Admitting: *Deleted

## 2024-11-13 VITALS — BP 151/95 | HR 100 | Temp 97.8°F

## 2024-11-13 DIAGNOSIS — C180 Malignant neoplasm of cecum: Secondary | ICD-10-CM

## 2024-11-13 DIAGNOSIS — Z95828 Presence of other vascular implants and grafts: Secondary | ICD-10-CM

## 2024-11-13 DIAGNOSIS — Z5112 Encounter for antineoplastic immunotherapy: Secondary | ICD-10-CM | POA: Diagnosis not present

## 2024-11-13 MED ORDER — OXYCODONE HCL 10 MG PO TABS: 10.0000 mg | ORAL_TABLET | ORAL | 0 refills | Status: DC | PRN

## 2024-11-13 MED ORDER — PEGFILGRASTIM-CBQV 6 MG/0.6ML ~~LOC~~ SOSY
6.0000 mg | PREFILLED_SYRINGE | Freq: Once | SUBCUTANEOUS | Status: AC
  Administered 2024-11-13: 6 mg via SUBCUTANEOUS
  Filled 2024-11-13: qty 0.6

## 2024-11-13 NOTE — Patient Instructions (Signed)
 [facility] [place] - A DEPT OF [facility]  Discharge Instructions: Thank you for choosing [facility] to provide your oncology and hematology care.  If you have a lab appointment with the Cancer Center - please note that after April 8th, 2024, all labs will be drawn in the cancer center.  You do not have to check in or register with the main entrance as you have in the past but will complete your check-in in the cancer center.  Wear comfortable clothing and clothing appropriate for easy access to any Portacath or PICC line.   We strive to give you quality time with your provider. You may need to reschedule your appointment if you arrive late (15 or more minutes).  Arriving late affects you and other patients whose appointments are after yours.  Also, if you miss three or more appointments without notifying the office, you may be dismissed from the clinic at the provider's discretion.      For prescription refill requests, have your pharmacy contact our office and allow 72 hours for refills to be completed.    Today you received the following disconnection of you continuous 5FU pump and injection   To help prevent nausea and vomiting after your treatment, we encourage you to take your nausea medication as directed.  BELOW ARE SYMPTOMS THAT SHOULD BE REPORTED IMMEDIATELY: *FEVER GREATER THAN 100.4 F (38 C) OR HIGHER *CHILLS OR SWEATING *NAUSEA AND VOMITING THAT IS NOT CONTROLLED WITH YOUR NAUSEA MEDICATION *UNUSUAL SHORTNESS OF BREATH *UNUSUAL BRUISING OR BLEEDING *URINARY PROBLEMS (pain or burning when urinating, or frequent urination) *BOWEL PROBLEMS (unusual diarrhea, constipation, pain near the anus) TENDERNESS IN MOUTH AND THROAT WITH OR WITHOUT PRESENCE OF ULCERS (sore throat, sores in mouth, or a toothache) UNUSUAL RASH, SWELLING OR PAIN  UNUSUAL VAGINAL DISCHARGE OR ITCHING   Items with * indicate a potential emergency and should be followed  up as soon as possible or go to the Emergency Department if any problems should occur.  Please show the CHEMOTHERAPY ALERT CARD or IMMUNOTHERAPY ALERT CARD at check-in to the Emergency Department and triage nurse.  Should you have questions after your visit or need to cancel or reschedule your appointment, please contact [facility] [place] - A DEPT OF [name] [phone]  and follow the prompts.  Office hours are 8:00 a.m. to 4:30 p.m. Monday - Friday. Please note that voicemails left after 4:00 p.m. may not be returned until the following business day.  We are closed weekends and major holidays. You have access to a nurse at all times for urgent questions. Please call the main number to the clinic [phone] and follow the prompts.  For any non-urgent questions, you may also contact your provider using MyChart. We now offer e-Visits for anyone 62 and older to request care online for non-urgent symptoms. For details visit [email or web address].   Also download the MyChart app! Go to the app store, search MyChart, open the app, select [place], and log in with your MyChart username and password.

## 2024-11-13 NOTE — Progress Notes (Signed)
 Blood pressure is elevated , patient just took his BP medication. Blood pressure rechecked, slightly better, patient will monitor.   Treatment given per orders. Patient tolerated it well without problems. Vitals stable and discharged home from clinic ambulatory. Follow up as scheduled.

## 2024-11-17 ENCOUNTER — Other Ambulatory Visit: Payer: Self-pay | Admitting: *Deleted

## 2024-11-17 DIAGNOSIS — I10 Essential (primary) hypertension: Secondary | ICD-10-CM

## 2024-11-17 MED ORDER — AMLODIPINE BESYLATE 10 MG PO TABS: 10.0000 mg | ORAL_TABLET | Freq: Every day | ORAL | 3 refills | Status: AC

## 2024-11-24 ENCOUNTER — Inpatient Hospital Stay: Admitting: Dietician

## 2024-11-24 ENCOUNTER — Inpatient Hospital Stay

## 2024-11-24 VITALS — BP 130/70 | HR 62 | Temp 96.8°F | Resp 18

## 2024-11-24 DIAGNOSIS — Z5112 Encounter for antineoplastic immunotherapy: Secondary | ICD-10-CM | POA: Diagnosis not present

## 2024-11-24 DIAGNOSIS — C180 Malignant neoplasm of cecum: Secondary | ICD-10-CM

## 2024-11-24 DIAGNOSIS — Z95828 Presence of other vascular implants and grafts: Secondary | ICD-10-CM

## 2024-11-24 LAB — CBC WITH DIFFERENTIAL/PLATELET
Abs Immature Granulocytes: 1.1 K/uL — ABNORMAL HIGH (ref 0.00–0.07)
Band Neutrophils: 1 %
Basophils Absolute: 0.2 K/uL — ABNORMAL HIGH (ref 0.0–0.1)
Basophils Relative: 1 %
Eosinophils Absolute: 0.2 K/uL (ref 0.0–0.5)
Eosinophils Relative: 1 %
HCT: 34.8 % — ABNORMAL LOW (ref 39.0–52.0)
Hemoglobin: 11 g/dL — ABNORMAL LOW (ref 13.0–17.0)
Lymphocytes Relative: 10 %
Lymphs Abs: 2.1 K/uL (ref 0.7–4.0)
MCH: 29.6 pg (ref 26.0–34.0)
MCHC: 31.6 g/dL (ref 30.0–36.0)
MCV: 93.8 fL (ref 80.0–100.0)
Metamyelocytes Relative: 2 %
Monocytes Absolute: 1.1 K/uL — ABNORMAL HIGH (ref 0.1–1.0)
Monocytes Relative: 5 %
Myelocytes: 1 %
Neutro Abs: 16.5 K/uL — ABNORMAL HIGH (ref 1.7–7.7)
Neutrophils Relative %: 77 %
Platelets: 177 K/uL (ref 150–400)
Promyelocytes Relative: 2 %
RBC: 3.71 MIL/uL — ABNORMAL LOW (ref 4.22–5.81)
RDW: 19.4 % — ABNORMAL HIGH (ref 11.5–15.5)
Smear Review: NORMAL
WBC: 21.1 K/uL — ABNORMAL HIGH (ref 4.0–10.5)
nRBC: 0 % (ref 0.0–0.2)

## 2024-11-24 LAB — URINALYSIS, DIPSTICK ONLY
Bilirubin Urine: NEGATIVE
Glucose, UA: NEGATIVE mg/dL
Hgb urine dipstick: NEGATIVE
Ketones, ur: NEGATIVE mg/dL
Leukocytes,Ua: NEGATIVE
Nitrite: NEGATIVE
Protein, ur: 30 mg/dL — AB
Specific Gravity, Urine: 1.018 (ref 1.005–1.030)
pH: 6 (ref 5.0–8.0)

## 2024-11-24 LAB — COMPREHENSIVE METABOLIC PANEL WITH GFR
ALT: 10 U/L (ref 0–44)
AST: 27 U/L (ref 15–41)
Albumin: 3.6 g/dL (ref 3.5–5.0)
Alkaline Phosphatase: 153 U/L — ABNORMAL HIGH (ref 38–126)
Anion gap: 12 (ref 5–15)
BUN: 7 mg/dL — ABNORMAL LOW (ref 8–23)
CO2: 22 mmol/L (ref 22–32)
Calcium: 8.6 mg/dL — ABNORMAL LOW (ref 8.9–10.3)
Chloride: 109 mmol/L (ref 98–111)
Creatinine, Ser: 1.05 mg/dL (ref 0.61–1.24)
GFR, Estimated: >60 mL/min (ref >60)
Glucose, Bld: 102 mg/dL — ABNORMAL HIGH (ref 70–99)
Potassium: 4 mmol/L (ref 3.5–5.1)
Sodium: 143 mmol/L (ref 135–145)
Total Bilirubin: 0.2 mg/dL (ref 0.0–1.2)
Total Protein: 6.5 g/dL (ref 6.5–8.1)

## 2024-11-24 LAB — MAGNESIUM: Magnesium: 1.9 mg/dL (ref 1.7–2.4)

## 2024-11-24 MED ORDER — FLUOROURACIL CHEMO INJECTION 2.5 GM/50ML
320.0000 mg/m2 | Freq: Once | INTRAVENOUS | Status: AC
  Administered 2024-11-24: 15:00:00 600 mg via INTRAVENOUS
  Filled 2024-11-24: qty 12

## 2024-11-24 MED ORDER — DEXAMETHASONE SODIUM PHOSPHATE 10 MG/ML IJ SOLN
10.0000 mg | Freq: Once | INTRAMUSCULAR | Status: AC
  Administered 2024-11-24: 11:00:00 10 mg via INTRAVENOUS

## 2024-11-24 MED ORDER — SODIUM CHLORIDE 0.9 % IV SOLN
150.0000 mg | Freq: Once | INTRAVENOUS | Status: AC
  Administered 2024-11-24: 11:00:00 150 mg via INTRAVENOUS
  Filled 2024-11-24: qty 150

## 2024-11-24 MED ORDER — SODIUM CHLORIDE 0.9 % IV SOLN
144.0000 mg/m2 | Freq: Once | INTRAVENOUS | Status: AC
  Administered 2024-11-24: 13:00:00 300 mg via INTRAVENOUS
  Filled 2024-11-24: qty 15

## 2024-11-24 MED ORDER — SODIUM CHLORIDE 0.9 % IV SOLN: Freq: Once | INTRAVENOUS | Status: AC

## 2024-11-24 MED ORDER — SODIUM CHLORIDE 0.9 % IV SOLN
400.0000 mg/m2 | Freq: Once | INTRAVENOUS | Status: AC
  Administered 2024-11-24: 13:00:00 780 mg via INTRAVENOUS
  Filled 2024-11-24: qty 39

## 2024-11-24 MED ORDER — ATROPINE SULFATE 1 MG/ML IV SOLN
1.0000 mg | Freq: Once | INTRAVENOUS | Status: AC
  Administered 2024-11-24: 12:00:00 1 mg via INTRAVENOUS
  Filled 2024-11-24: qty 1

## 2024-11-24 MED ORDER — SODIUM CHLORIDE 0.9 % IV SOLN
5.0000 mg/kg | Freq: Once | INTRAVENOUS | Status: AC
  Administered 2024-11-24: 12:00:00 400 mg via INTRAVENOUS
  Filled 2024-11-24: qty 16

## 2024-11-24 MED ORDER — PALONOSETRON HCL INJECTION 0.25 MG/5ML
0.2500 mg | Freq: Once | INTRAVENOUS | Status: AC
  Administered 2024-11-24: 11:00:00 0.25 mg via INTRAVENOUS
  Filled 2024-11-24: qty 5

## 2024-11-24 MED ORDER — SODIUM CHLORIDE 0.9 % IV SOLN
3100.0000 mg | INTRAVENOUS | Status: DC
  Administered 2024-11-24: 15:00:00 3100 mg via INTRAVENOUS
  Filled 2024-11-24: qty 62

## 2024-11-24 NOTE — Progress Notes (Signed)
 Patient presents today for chemotherapy MVASI /Folfiri infusion.  Patient is in satisfactory condition with no new complaints voiced.  Vital signs are stable.  Labs reviewed and all labs are within treatment parameters.  We will proceed with treatment per MD orders.    Treatment given today per MD orders. Tolerated infusion without adverse affects. Vital signs stable. No complaints at this time. Discharged from clinic ambulatory in stable condition. Alert and oriented x 3. F/U with [facility] as scheduled. 5FU ambulatory pump infusing with no alarms beeping.

## 2024-11-24 NOTE — Progress Notes (Signed)
 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 He is receiving dose reduced Folfiri + Beva q14d.    Met with patient in infusion. He reports doing well today. Patient says he restarted appetite stimulant as RD suggested and has been eating non-stop since. Patient had turkey, eggs, toast, and an Ensure for breakfast. Recalls salmon cakes, mashed potatoes, and string beans for dinner. Patient brother is a good cook and preparing meals. Patient request strawberry Ensure to drink while receiving treatment. Antidiarrheals working well for post treatment diarrhea. He denies nausea or vomiting.    Medications: reviewed   Labs: reviewed   Anthropometrics: Wt 180 lb 12.4 oz today - trending up  12/2 - 178 lb 8 oz 11/17 - 175 lb 3.2 oz   NUTRITION DIAGNOSIS: Unintended wt loss - improving    INTERVENTION:  Continue megace  for appetite Encourage high calorie high protein foods Continue daily Ensure Plus/equivalent - coupons provided     MONITORING, EVALUATION, GOAL: wt trends, intake   NEXT VISIT: To be scheduled as needed

## 2024-11-24 NOTE — Progress Notes (Signed)
 Continue 5FU pump at 3100 mg per Dr Davonna - prior mucositis   Niels Molt, PharmD Clinical Pharmacist [facility]

## 2024-11-24 NOTE — Patient Instructions (Signed)
 [facility] [place] - A DEPT OF [facility]  Discharge Instructions: Thank you for choosing [facility] to provide your oncology and hematology care.  If you have a lab appointment with the Cancer Center - please note that after April 8th, 2024, all labs will be drawn in the cancer center.  You do not have to check in or register with the main entrance as you have in the past but will complete your check-in in the cancer center.  Wear comfortable clothing and clothing appropriate for easy access to any Portacath or PICC line.   We strive to give you quality time with your provider. You may need to reschedule your appointment if you arrive late (15 or more minutes).  Arriving late affects you and other patients whose appointments are after yours.  Also, if you miss three or more appointments without notifying the office, you may be dismissed from the clinic at the provider's discretion.      For prescription refill requests, have your pharmacy contact our office and allow 72 hours for refills to be completed.    Today you received the following chemotherapy and/or immunotherapy agents MVASI/Folfiri   To help prevent nausea and vomiting after your treatment, we encourage you to take your nausea medication as directed.   BELOW ARE SYMPTOMS THAT SHOULD BE REPORTED IMMEDIATELY: *FEVER GREATER THAN 100.4 F (38 C) OR HIGHER *CHILLS OR SWEATING *NAUSEA AND VOMITING THAT IS NOT CONTROLLED WITH YOUR NAUSEA MEDICATION *UNUSUAL SHORTNESS OF BREATH *UNUSUAL BRUISING OR BLEEDING *URINARY PROBLEMS (pain or burning when urinating, or frequent urination) *BOWEL PROBLEMS (unusual diarrhea, constipation, pain near the anus) TENDERNESS IN MOUTH AND THROAT WITH OR WITHOUT PRESENCE OF ULCERS (sore throat, sores in mouth, or a toothache) UNUSUAL RASH, SWELLING OR PAIN  UNUSUAL VAGINAL DISCHARGE OR ITCHING   Items with * indicate a potential emergency and should be followed  up as soon as possible or go to the Emergency Department if any problems should occur.  Please show the CHEMOTHERAPY ALERT CARD or IMMUNOTHERAPY ALERT CARD at check-in to the Emergency Department and triage nurse.  Should you have questions after your visit or need to cancel or reschedule your appointment, please contact [facility] [place] - A DEPT OF Eligha Bridegroom [facility] [phone]  and follow the prompts.  Office hours are 8:00 a.m. to 4:30 p.m. Monday - Friday. Please note that voicemails left after 4:00 p.m. may not be returned until the following business day.  We are closed weekends and major holidays. You have access to a nurse at all times for urgent questions. Please call the main number to the clinic [phone] and follow the prompts.  For any non-urgent questions, you may also contact your provider using MyChart. We now offer e-Visits for anyone 8 and older to request care online for non-urgent symptoms. For details visit [email or web address].   Also download the MyChart app! Go to the app store, search "MyChart", open the app, select [place], and log in with your MyChart username and password.

## 2024-11-25 LAB — CEA: CEA: 200 ng/mL — ABNORMAL HIGH (ref 0.0–4.7)

## 2024-11-26 ENCOUNTER — Inpatient Hospital Stay

## 2024-11-26 VITALS — BP 127/84 | HR 76 | Resp 16

## 2024-11-26 DIAGNOSIS — Z5112 Encounter for antineoplastic immunotherapy: Secondary | ICD-10-CM | POA: Diagnosis not present

## 2024-11-26 DIAGNOSIS — C180 Malignant neoplasm of cecum: Secondary | ICD-10-CM

## 2024-11-26 DIAGNOSIS — Z95828 Presence of other vascular implants and grafts: Secondary | ICD-10-CM

## 2024-11-26 MED ORDER — PEGFILGRASTIM-CBQV 6 MG/0.6ML ~~LOC~~ SOSY
6.0000 mg | PREFILLED_SYRINGE | Freq: Once | SUBCUTANEOUS | Status: AC
  Administered 2024-11-26: 15:00:00 6 mg via SUBCUTANEOUS
  Filled 2024-11-26: qty 0.6

## 2024-11-26 NOTE — Patient Instructions (Signed)
 [facility] - A DEPT OF [facility]  Discharge Instructions: Thank you for choosing [facility] to provide your oncology and hematology care.  If you have a lab appointment with the Cancer Center - please note that after April 8th, 2024, all labs will be drawn in the cancer center.  You do not have to check in or register with the main entrance as you have in the past but will complete your check-in in the cancer center.  Wear comfortable clothing and clothing appropriate for easy access to any Portacath or PICC line.   We strive to give you quality time with your provider. You may need to reschedule your appointment if you arrive late (15 or more minutes).  Arriving late affects you and other patients whose appointments are after yours.  Also, if you miss three or more appointments without notifying the office, you may be dismissed from the clinic at the providers discretion.      For prescription refill requests, have your pharmacy contact our office and allow 72 hours for refills to be completed.    Today you received the following Pump stop and Udenyca .  Pegfilgrastim  Injection What is this medication? PEGFILGRASTIM  (PEG fil gra stim) lowers the risk of infection in people who are receiving chemotherapy. It works by systems analyst make more white blood cells, which protects your body from infection. It may also be used to help people who have been exposed to high doses of radiation. This medicine may be used for other purposes; ask your health care provider or pharmacist if you have questions. COMMON BRAND NAME(S): Fulphila , Fylnetra , Neulasta , Nyvepria , Stimufend , UDENYCA , UDENYCA  ONBODY, Ziextenzo  What should I tell my care team before I take this medication? They need to know if you have any of these conditions: Kidney disease Latex allergy Ongoing radiation therapy Sickle cell disease Skin reactions to acrylic adhesives (On-Body  Injector only) An unusual or allergic reaction to pegfilgrastim , filgrastim , other medications, foods, dyes, or preservatives Pregnant or trying to get pregnant Breast-feeding How should I use this medication? This medication is for injection under the skin. If you get this medication at home, you will be taught how to prepare and give the pre-filled syringe or how to use the On-body Injector. Refer to the patient Instructions for Use for detailed instructions. Use exactly as directed. Tell your care team immediately if you suspect that the On-body Injector may not have performed as intended or if you suspect the use of the On-body Injector resulted in a missed or partial dose. It is important that you put your used needles and syringes in a special sharps container. Do not put them in a trash can. If you do not have a sharps container, call your pharmacist or care team to get one. Talk to your care team about the use of this medication in children. While this medication may be prescribed for selected conditions, precautions do apply. Overdosage: If you think you have taken too much of this medicine contact a poison control center or emergency room at once. NOTE: This medicine is only for you. Do not share this medicine with others. What if I miss a dose? It is important not to miss your dose. Call your care team if you miss your dose. If you miss a dose due to an On-body Injector failure or leakage, a new dose should be administered as soon as possible using a single prefilled syringe for manual  use. What may interact with this medication? Interactions have not been studied. This list may not describe all possible interactions. Give your health care provider a list of all the medicines, herbs, non-prescription drugs, or dietary supplements you use. Also tell them if you smoke, drink alcohol , or use illegal drugs. Some items may interact with your medicine. What should I watch for while using this  medication? Your condition will be monitored carefully while you are receiving this medication. You may need blood work done while you are taking this medication. Talk to your care team about your risk of cancer. You may be more at risk for certain types of cancer if you take this medication. If you are going to need a MRI, CT scan, or other procedure, tell your care team that you are using this medication (On-Body Injector only). What side effects may I notice from receiving this medication? Side effects that you should report to your care team as soon as possible: Allergic reactions--skin rash, itching, hives, swelling of the face, lips, tongue, or throat Capillary leak syndrome--stomach or muscle pain, unusual weakness or fatigue, feeling faint or lightheaded, decrease in the amount of urine, swelling of the ankles, hands, or feet, trouble breathing High white blood cell level--fever, fatigue, trouble breathing, night sweats, change in vision, weight loss Inflammation of the aorta--fever, fatigue, back, chest, or stomach pain, severe headache Kidney injury (glomerulonephritis)--decrease in the amount of urine, red or dark brown urine, foamy or bubbly urine, swelling of the ankles, hands, or feet Shortness of breath or trouble breathing Spleen injury--pain in upper left stomach or shoulder Unusual bruising or bleeding Side effects that usually do not require medical attention (report to your care team if they continue or are bothersome): Bone pain Pain in the hands or feet This list may not describe all possible side effects. Call your doctor for medical advice about side effects. You may report side effects to FDA at 1-800-FDA-1088. Where should I keep my medication? Keep out of the reach of children. If you are using this medication at home, you will be instructed on how to store it. Throw away any unused medication after the expiration date on the label. NOTE: This sheet is a summary. It  may not cover all possible information. If you have questions about this medicine, talk to your doctor, pharmacist, or health care provider.  2024 Elsevier/Gold Standard (2021-10-28 00:00:00)   To help prevent nausea and vomiting after your treatment, we encourage you to take your nausea medication as directed.  BELOW ARE SYMPTOMS THAT SHOULD BE REPORTED IMMEDIATELY: *FEVER GREATER THAN 100.4 F (38 C) OR HIGHER *CHILLS OR SWEATING *NAUSEA AND VOMITING THAT IS NOT CONTROLLED WITH YOUR NAUSEA MEDICATION *UNUSUAL SHORTNESS OF BREATH *UNUSUAL BRUISING OR BLEEDING *URINARY PROBLEMS (pain or burning when urinating, or frequent urination) *BOWEL PROBLEMS (unusual diarrhea, constipation, pain near the anus) TENDERNESS IN MOUTH AND THROAT WITH OR WITHOUT PRESENCE OF ULCERS (sore throat, sores in mouth, or a toothache) UNUSUAL RASH, SWELLING OR PAIN  UNUSUAL VAGINAL DISCHARGE OR ITCHING   Items with * indicate a potential emergency and should be followed up as soon as possible or go to the Emergency Department if any problems should occur.  Please show the CHEMOTHERAPY ALERT CARD or IMMUNOTHERAPY ALERT CARD at check-in to the Emergency Department and triage nurse.  Should you have questions after your visit or need to cancel or reschedule your appointment, please contact [facility] - A DEPT OF [facility] [phone]  and follow the prompts.  Office hours are 8:00 a.m. to 4:30 p.m. Monday - Friday. Please note that voicemails left after 4:00 p.m. may not be returned until the following business day.  We are closed weekends and major holidays. You have access to a nurse at all times for urgent questions. Please call the main number to the clinic [phone] and follow the prompts.  For any non-urgent questions, you may also contact your provider using MyChart. We now offer e-Visits for anyone 67 and older to request care online for non-urgent symptoms. For details  visit [email or web address].   Also download the MyChart app! Go to the app store, search MyChart, open the app, select [facility], and log in with your MyChart username and password.

## 2024-11-26 NOTE — Progress Notes (Signed)
 Patient presents today for pump stop and Udenyca  injection. Patients port flushed without difficulty.  Good blood return noted with no bruising or swelling noted at site.  Band aid applied.    Patient tolerated injection in right arm with no complaints voiced.  Site clean and dry with no bruising or swelling noted.  No complaints of pain.  Discharged with vital signs stable and no signs or symptoms of distress noted.

## 2024-12-01 ENCOUNTER — Ambulatory Visit (HOSPITAL_COMMUNITY)
Admission: RE | Admit: 2024-12-01 | Discharge: 2024-12-01 | Disposition: A | Discharge status: Home / Self Care | Source: Ambulatory Visit | Attending: Oncology | Admitting: Oncology

## 2024-12-01 DIAGNOSIS — C180 Malignant neoplasm of cecum: Secondary | ICD-10-CM | POA: Diagnosis present

## 2024-12-01 MED ORDER — HEPARIN SOD (PORK) LOCK FLUSH 100 UNIT/ML IV SOLN
500.0000 [IU] | Freq: Once | INTRAVENOUS | Status: AC
  Administered 2024-12-01: 500 [IU] via INTRAVENOUS

## 2024-12-01 MED ORDER — IOHEXOL 9 MG/ML PO SOLN
ORAL | Status: AC
  Filled 2024-12-01: qty 1000

## 2024-12-01 MED ORDER — IOHEXOL 350 MG/ML SOLN
75.0000 mL | Freq: Once | INTRAVENOUS | Status: AC | PRN
  Administered 2024-12-01: 75 mL via INTRAVENOUS

## 2024-12-01 MED ORDER — IOHEXOL 300 MG/ML  SOLN: 100.0000 mL | Freq: Once | INTRAMUSCULAR | Status: DC | PRN

## 2024-12-01 MED ORDER — HEPARIN SOD (PORK) LOCK FLUSH 100 UNIT/ML IV SOLN
INTRAVENOUS | Status: AC
  Filled 2024-12-01: qty 5

## 2024-12-01 MED ORDER — IOHEXOL 9 MG/ML PO SOLN
500.0000 mL | ORAL | Status: AC
  Administered 2024-12-01: 1000 mL via ORAL

## 2024-12-08 ENCOUNTER — Encounter: Payer: Self-pay | Admitting: *Deleted

## 2024-12-08 ENCOUNTER — Inpatient Hospital Stay: Admitting: Oncology

## 2024-12-08 ENCOUNTER — Inpatient Hospital Stay

## 2024-12-08 VITALS — BP 147/89 | HR 74 | Temp 97.6°F | Resp 18

## 2024-12-08 DIAGNOSIS — G8929 Other chronic pain: Secondary | ICD-10-CM | POA: Diagnosis not present

## 2024-12-08 DIAGNOSIS — C180 Malignant neoplasm of cecum: Secondary | ICD-10-CM

## 2024-12-08 DIAGNOSIS — Z95828 Presence of other vascular implants and grafts: Secondary | ICD-10-CM

## 2024-12-08 DIAGNOSIS — R197 Diarrhea, unspecified: Secondary | ICD-10-CM | POA: Diagnosis not present

## 2024-12-08 DIAGNOSIS — Z720 Tobacco use: Secondary | ICD-10-CM

## 2024-12-08 DIAGNOSIS — K1230 Oral mucositis (ulcerative), unspecified: Secondary | ICD-10-CM | POA: Diagnosis not present

## 2024-12-08 DIAGNOSIS — Z5112 Encounter for antineoplastic immunotherapy: Secondary | ICD-10-CM | POA: Diagnosis not present

## 2024-12-08 DIAGNOSIS — D649 Anemia, unspecified: Secondary | ICD-10-CM | POA: Diagnosis not present

## 2024-12-08 DIAGNOSIS — G4709 Other insomnia: Secondary | ICD-10-CM

## 2024-12-08 LAB — CBC WITH DIFFERENTIAL/PLATELET
Abs Immature Granulocytes: 1.28 K/uL — ABNORMAL HIGH (ref 0.00–0.07)
Basophils Absolute: 0.2 K/uL — ABNORMAL HIGH (ref 0.0–0.1)
Basophils Relative: 2 %
Eosinophils Absolute: 0.2 K/uL (ref 0.0–0.5)
Eosinophils Relative: 2 %
HCT: 36.2 % — ABNORMAL LOW (ref 39.0–52.0)
Hemoglobin: 11.5 g/dL — ABNORMAL LOW (ref 13.0–17.0)
Immature Granulocytes: 9 %
Lymphocytes Relative: 17 %
Lymphs Abs: 2.4 K/uL (ref 0.7–4.0)
MCH: 29.9 pg (ref 26.0–34.0)
MCHC: 31.8 g/dL (ref 30.0–36.0)
MCV: 94 fL (ref 80.0–100.0)
Monocytes Absolute: 1.8 K/uL — ABNORMAL HIGH (ref 0.1–1.0)
Monocytes Relative: 13 %
Neutro Abs: 8.2 K/uL — ABNORMAL HIGH (ref 1.7–7.7)
Neutrophils Relative %: 57 %
Platelets: 229 K/uL (ref 150–400)
RBC: 3.85 MIL/uL — ABNORMAL LOW (ref 4.22–5.81)
RDW: 20.3 % — ABNORMAL HIGH (ref 11.5–15.5)
Smear Review: NORMAL
WBC: 14.1 K/uL — ABNORMAL HIGH (ref 4.0–10.5)
nRBC: 0.1 % (ref 0.0–0.2)

## 2024-12-08 LAB — COMPREHENSIVE METABOLIC PANEL WITH GFR
ALT: <5 U/L (ref 0–44)
AST: 30 U/L (ref 15–41)
Albumin: 3.6 g/dL (ref 3.5–5.0)
Alkaline Phosphatase: 100 U/L (ref 38–126)
Anion gap: 14 (ref 5–15)
BUN: 11 mg/dL (ref 8–23)
CO2: 20 mmol/L — ABNORMAL LOW (ref 22–32)
Calcium: 8.7 mg/dL — ABNORMAL LOW (ref 8.9–10.3)
Chloride: 105 mmol/L (ref 98–111)
Creatinine, Ser: 0.97 mg/dL (ref 0.61–1.24)
GFR, Estimated: >60 mL/min
Glucose, Bld: 89 mg/dL (ref 70–99)
Potassium: 3.8 mmol/L (ref 3.5–5.1)
Sodium: 138 mmol/L (ref 135–145)
Total Bilirubin: 0.4 mg/dL (ref 0.0–1.2)
Total Protein: 6.8 g/dL (ref 6.5–8.1)

## 2024-12-08 LAB — URINALYSIS, DIPSTICK ONLY
Bilirubin Urine: NEGATIVE
Glucose, UA: NEGATIVE mg/dL
Hgb urine dipstick: NEGATIVE
Ketones, ur: NEGATIVE mg/dL
Leukocytes,Ua: NEGATIVE
Nitrite: NEGATIVE
Protein, ur: 100 mg/dL — AB
Specific Gravity, Urine: 1.016 (ref 1.005–1.030)
pH: 6 (ref 5.0–8.0)

## 2024-12-08 LAB — MAGNESIUM: Magnesium: 2 mg/dL (ref 1.7–2.4)

## 2024-12-08 MED ORDER — FLUOROURACIL CHEMO INJECTION 2.5 GM/50ML
320.0000 mg/m2 | Freq: Once | INTRAVENOUS | Status: AC
  Administered 2024-12-08: 650 mg via INTRAVENOUS
  Filled 2024-12-08: qty 13

## 2024-12-08 MED ORDER — LEUCOVORIN CALCIUM INJECTION 350 MG
400.0000 mg/m2 | Freq: Once | INTRAVENOUS | Status: AC
  Administered 2024-12-08: 804 mg via INTRAVENOUS
  Filled 2024-12-08: qty 40.2

## 2024-12-08 MED ORDER — DEXAMETHASONE 4 MG PO TABS: 8.0000 mg | ORAL_TABLET | Freq: Every day | ORAL | 1 refills | Status: AC

## 2024-12-08 MED ORDER — OXALIPLATIN CHEMO INJECTION 100 MG/20ML
85.0000 mg/m2 | Freq: Once | INTRAVENOUS | Status: AC
  Administered 2024-12-08: 170 mg via INTRAVENOUS
  Filled 2024-12-08: qty 34

## 2024-12-08 MED ORDER — ONDANSETRON HCL 8 MG PO TABS: 8.0000 mg | ORAL_TABLET | Freq: Three times a day (TID) | ORAL | 1 refills | Status: AC | PRN

## 2024-12-08 MED ORDER — SODIUM CHLORIDE 0.9 % IV SOLN
1920.0000 mg/m2 | INTRAVENOUS | Status: DC
  Administered 2024-12-08: 3850 mg via INTRAVENOUS
  Filled 2024-12-08: qty 77

## 2024-12-08 MED ORDER — DEXTROSE 5 % IV SOLN: INTRAVENOUS | Status: DC

## 2024-12-08 MED ORDER — SODIUM CHLORIDE 0.9 % IV SOLN
150.0000 mg | Freq: Once | INTRAVENOUS | Status: AC
  Administered 2024-12-08: 150 mg via INTRAVENOUS
  Filled 2024-12-08: qty 150

## 2024-12-08 MED ORDER — BEVACIZUMAB-AWWB CHEMO INJECTION 400 MG/16ML
5.0000 mg/kg | Freq: Once | INTRAVENOUS | Status: AC
  Administered 2024-12-08: 400 mg via INTRAVENOUS
  Filled 2024-12-08: qty 16

## 2024-12-08 MED ORDER — PROCHLORPERAZINE MALEATE 10 MG PO TABS: 10.0000 mg | ORAL_TABLET | Freq: Four times a day (QID) | ORAL | 1 refills | Status: AC | PRN

## 2024-12-08 MED ORDER — LIDOCAINE-PRILOCAINE 2.5-2.5 % EX CREA: TOPICAL_CREAM | CUTANEOUS | 3 refills | Status: AC

## 2024-12-08 MED ORDER — DEXAMETHASONE SOD PHOSPHATE PF 10 MG/ML IJ SOLN
10.0000 mg | Freq: Once | INTRAMUSCULAR | Status: AC
  Administered 2024-12-08: 10 mg via INTRAVENOUS

## 2024-12-08 MED ORDER — PALONOSETRON HCL INJECTION 0.25 MG/5ML
0.2500 mg | Freq: Once | INTRAVENOUS | Status: AC
  Administered 2024-12-08: 0.25 mg via INTRAVENOUS
  Filled 2024-12-08: qty 5

## 2024-12-08 MED ORDER — SODIUM CHLORIDE 0.9 % IV SOLN: INTRAVENOUS | Status: DC

## 2024-12-08 NOTE — Progress Notes (Signed)
 DISCONTINUE ON PATHWAY REGIMEN - Colorectal     A cycle is every 14 days:     Bevacizumab -xxxx      Irinotecan       Leucovorin       Fluorouracil       Fluorouracil    **Always confirm dose/schedule in your pharmacy ordering system**  PRIOR TREATMENT: [id number]: FOLFIRI + Bevacizumab  q14 Days  START ON PATHWAY REGIMEN - Colorectal     A cycle is every 14 days:     Bevacizumab -xxxx      Oxaliplatin       Leucovorin       Fluorouracil       Fluorouracil    **Always confirm dose/schedule in your pharmacy ordering system**  Patient Characteristics: Distant Metastases, Nonsurgical Candidate, Non-KRAS G12C, 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

## 2024-12-08 NOTE — Patient Instructions (Signed)
 [facility] - A DEPT OF [facility]  Discharge Instructions: Thank you for choosing [facility] to provide your oncology and hematology care.  If you have a lab appointment with the Cancer Center - please note that after April 8th, 2024, all labs will be drawn in the cancer center.  You do not have to check in or register with the main entrance as you have in the past but will complete your check-in in the cancer center.  Wear comfortable clothing and clothing appropriate for easy access to any Portacath or PICC line.   We strive to give you quality time with your provider. You may need to reschedule your appointment if you arrive late (15 or more minutes).  Arriving late affects you and other patients whose appointments are after yours.  Also, if you miss three or more appointments without notifying the office, you may be dismissed from the clinic at the providers discretion.      For prescription refill requests, have your pharmacy contact our office and allow 72 hours for refills to be completed.    Today you received the following chemotherapy and/or immunotherapy agents MVASI , Leucovorin , Oxaliplatin , and Adrucil .  Bevacizumab  Injection What is this medication? BEVACIZUMAB  (be va SIZ yoo mab) treats some types of cancer. It works by blocking a protein that causes cancer cells to grow and multiply. This helps to slow or stop the spread of cancer cells. It is a monoclonal antibody. This medicine may be used for other purposes; ask your health care provider or pharmacist if you have questions. COMMON BRAND NAME(S): Alymsys , Avastin , MVASI , Vegzalma, Zirabev  What should I tell my care team before I take this medication? They need to know if you have any of these conditions: Blood clots Coughing up blood Having or recent surgery Heart failure High blood pressure History of a connection between 2 or more body parts that do not usually connect  (fistula) History of a tear in your stomach or intestines Protein in your urine An unusual or allergic reaction to bevacizumab , other medications, foods, dyes, or preservatives Pregnant or trying to get pregnant Breast-feeding How should I use this medication? This medication is injected into a vein. It is given by your care team in a hospital or clinic setting. Talk to your care team the use of this medication in children. Special care may be needed. Overdosage: If you think you have taken too much of this medicine contact a poison control center or emergency room at once. NOTE: This medicine is only for you. Do not share this medicine with others. What if I miss a dose? Keep appointments for follow-up doses. It is important not to miss your dose. Call your care team if you are unable to keep an appointment. What may interact with this medication? Interactions are not expected. This list may not describe all possible interactions. Give your health care provider a list of all the medicines, herbs, non-prescription drugs, or dietary supplements you use. Also tell them if you smoke, drink alcohol , or use illegal drugs. Some items may interact with your medicine. What should I watch for while using this medication? Your condition will be monitored carefully while you are receiving this medication. You may need blood work while taking this medication. This medication may make you feel generally unwell. This is not uncommon as chemotherapy can affect healthy cells as well as cancer cells. Report any side effects. Continue your course of treatment  even though you feel ill unless your care team tells you to stop. This medication may increase your risk to bruise or bleed. Call your care team if you notice any unusual bleeding. Before having surgery, talk to your care team to make sure it is ok. This medication can increase the risk of poor healing of your surgical site or wound. You will need to stop  this medication for 28 days before surgery. After surgery, wait at least 28 days before restarting this medication. Make sure the surgical site or wound is healed enough before restarting this medication. Talk to your care team if questions. Talk to your care team if you may be pregnant. Serious birth defects can occur if you take this medication during pregnancy and for 6 months after the last dose. Contraception is recommended while taking this medication and for 6 months after the last dose. Your care team can help you find the option that works for you. Do not breastfeed while taking this medication and for 6 months after the last dose. This medication can cause infertility. Talk to your care team if you are concerned about your fertility. What side effects may I notice from receiving this medication? Side effects that you should report to your care team as soon as possible: Allergic reactions--skin rash, itching, hives, swelling of the face, lips, tongue, or throat Bleeding--bloody or black, tar-like stools, vomiting blood or brown material that looks like coffee grounds, red or dark brown urine, small red or purple spots on skin, unusual bruising or bleeding Blood clot--pain, swelling, or warmth in the leg, shortness of breath, chest pain Heart attack--pain or tightness in the chest, shoulders, arms, or jaw, nausea, shortness of breath, cold or clammy skin, feeling faint or lightheaded Heart failure--shortness of breath, swelling of the ankles, feet, or hands, sudden weight gain, unusual weakness or fatigue Increase in blood pressure Infection--fever, chills, cough, sore throat, wounds that don't heal, pain or trouble when passing urine, general feeling of discomfort or being unwell Infusion reactions--chest pain, shortness of breath or trouble breathing, feeling faint or lightheaded Kidney injury--decrease in the amount of urine, swelling of the ankles, hands, or feet Stomach pain that is  severe, does not go away, or gets worse Stroke--sudden numbness or weakness of the face, arm, or leg, trouble speaking, confusion, trouble walking, loss of balance or coordination, dizziness, severe headache, change in vision Sudden and severe headache, confusion, change in vision, seizures, which may be signs of posterior reversible encephalopathy syndrome (PRES) Side effects that usually do not require medical attention (report to your care team if they continue or are bothersome): Back pain Change in taste Diarrhea Dry skin Increased tears Nosebleed This list may not describe all possible side effects. Call your doctor for medical advice about side effects. You may report side effects to FDA at 1-800-FDA-1088. Where should I keep my medication? This medication is given in a hospital or clinic. It will not be stored at home. NOTE: This sheet is a summary. It may not cover all possible information. If you have questions about this medicine, talk to your doctor, pharmacist, or health care provider.  2024 Elsevier/Gold Standard (2022-04-14 00:00:00)  Leucovorin  Injection What is this medication? LEUCOVORIN  (loo koe VOR in) prevents side effects from certain medications, such as methotrexate. It works by increasing folate levels. This helps protect healthy cells in your body. It may also be used to treat anemia caused by low levels of folate. It can also be used with  fluorouracil , a type of chemotherapy, to treat colorectal cancer. It works by increasing the effects of fluorouracil  in the body. This medicine may be used for other purposes; ask your health care provider or pharmacist if you have questions. What should I tell my care team before I take this medication? They need to know if you have any of these conditions: Anemia from low levels of vitamin B12 in the blood An unusual or allergic reaction to leucovorin , folic acid , other medications, foods, dyes, or preservatives Pregnant or  trying to get pregnant Breastfeeding How should I use this medication? This medication is injected into a vein or a muscle. It is given by your care team in a hospital or clinic setting. Talk to your care team about the use of this medication in children. Special care may be needed. Overdosage: If you think you have taken too much of this medicine contact a poison control center or emergency room at once. NOTE: This medicine is only for you. Do not share this medicine with others. What if I miss a dose? Keep appointments for follow-up doses. It is important not to miss your dose. Call your care team if you are unable to keep an appointment. What may interact with this medication? Capecitabine Fluorouracil  Phenobarbital Phenytoin Primidone Trimethoprim;sulfamethoxazole This list may not describe all possible interactions. Give your health care provider a list of all the medicines, herbs, non-prescription drugs, or dietary supplements you use. Also tell them if you smoke, drink alcohol , or use illegal drugs. Some items may interact with your medicine. What should I watch for while using this medication? Your condition will be monitored carefully while you are receiving this medication. This medication may increase the side effects of 5-fluorouracil . Tell your care team if you have diarrhea or mouth sores that do not get better or that get worse. What side effects may I notice from receiving this medication? Side effects that you should report to your care team as soon as possible: Allergic reactions--skin rash, itching, hives, swelling of the face, lips, tongue, or throat This list may not describe all possible side effects. Call your doctor for medical advice about side effects. You may report side effects to FDA at 1-800-FDA-1088. Where should I keep my medication? This medication is given in a hospital or clinic. It will not be stored at home. NOTE: This sheet is a summary. It may not  cover all possible information. If you have questions about this medicine, talk to your doctor, pharmacist, or health care provider.  2024 Elsevier/Gold Standard (2022-05-02 00:00:00)  Oxaliplatin  Injection What is this medication? OXALIPLATIN  (ox AL i PLA tin) treats colorectal cancer. It works by slowing down the growth of cancer cells. This medicine may be used for other purposes; ask your health care provider or pharmacist if you have questions. COMMON BRAND NAME(S): Eloxatin  What should I tell my care team before I take this medication? They need to know if you have any of these conditions: Heart disease History of irregular heartbeat or rhythm Liver disease Low blood cell levels (white cells, red cells, and platelets) Lung or breathing disease, such as asthma Take medications that treat or prevent blood clots Tingling of the fingers, toes, or other nerve disorder An unusual or allergic reaction to oxaliplatin , other medications, foods, dyes, or preservatives If you or your partner are pregnant or trying to get pregnant Breast-feeding How should I use this medication? This medication is injected into a vein. It is given by your care  team in a hospital or clinic setting. Talk to your care team about the use of this medication in children. Special care may be needed. Overdosage: If you think you have taken too much of this medicine contact a poison control center or emergency room at once. NOTE: This medicine is only for you. Do not share this medicine with others. What if I miss a dose? Keep appointments for follow-up doses. It is important not to miss a dose. Call your care team if you are unable to keep an appointment. What may interact with this medication? Do not take this medication with any of the following: Cisapride Dronedarone Pimozide Thioridazine This medication may also interact with the following: Aspirin  and aspirin -like medications Certain medications that treat  or prevent blood clots, such as warfarin, apixaban, dabigatran, and rivaroxaban Cisplatin Cyclosporine Diuretics Medications for infection, such as acyclovir, adefovir, amphotericin B, bacitracin, cidofovir, foscarnet, ganciclovir, gentamicin, pentamidine, vancomycin NSAIDs, medications for pain and inflammation, such as ibuprofen  or naproxen Other medications that cause heart rhythm changes Pamidronate Zoledronic acid This list may not describe all possible interactions. Give your health care provider a list of all the medicines, herbs, non-prescription drugs, or dietary supplements you use. Also tell them if you smoke, drink alcohol , or use illegal drugs. Some items may interact with your medicine. What should I watch for while using this medication? Your condition will be monitored carefully while you are receiving this medication. You may need blood work while taking this medication. This medication may make you feel generally unwell. This is not uncommon as chemotherapy can affect healthy cells as well as cancer cells. Report any side effects. Continue your course of treatment even though you feel ill unless your care team tells you to stop. This medication may increase your risk of getting an infection. Call your care team for advice if you get a fever, chills, sore throat, or other symptoms of a cold or flu. Do not treat yourself. Try to avoid being around people who are sick. Avoid taking medications that contain aspirin , acetaminophen , ibuprofen , naproxen, or ketoprofen unless instructed by your care team. These medications may hide a fever. Be careful brushing or flossing your teeth or using a toothpick because you may get an infection or bleed more easily. If you have any dental work done, tell your dentist you are receiving this medication. This medication can make you more sensitive to cold. Do not drink cold drinks or use ice. Cover exposed skin before coming in contact with cold  temperatures or cold objects. When out in cold weather wear warm clothing and cover your mouth and nose to warm the air that goes into your lungs. Tell your care team if you get sensitive to the cold. Talk to your care team if you or your partner are pregnant or think either of you might be pregnant. This medication can cause serious birth defects if taken during pregnancy and for 9 months after the last dose. A negative pregnancy test is required before starting this medication. A reliable form of contraception is recommended while taking this medication and for 9 months after the last dose. Talk to your care team about effective forms of contraception. Do not father a child while taking this medication and for 6 months after the last dose. Use a condom while having sex during this time period. Do not breastfeed while taking this medication and for 3 months after the last dose. This medication may cause infertility. Talk to your care team  if you are concerned about your fertility. What side effects may I notice from receiving this medication? Side effects that you should report to your care team as soon as possible: Allergic reactions--skin rash, itching, hives, swelling of the face, lips, tongue, or throat Bleeding--bloody or black, tar-like stools, vomiting blood or brown material that looks like coffee grounds, red or dark brown urine, small red or purple spots on skin, unusual bruising or bleeding Dry cough, shortness of breath or trouble breathing Heart rhythm changes--fast or irregular heartbeat, dizziness, feeling faint or lightheaded, chest pain, trouble breathing Infection--fever, chills, cough, sore throat, wounds that don't heal, pain or trouble when passing urine, general feeling of discomfort or being unwell Liver injury--right upper belly pain, loss of appetite, nausea, light-colored stool, dark yellow or brown urine, yellowing skin or eyes, unusual weakness or fatigue Low red blood cell  level--unusual weakness or fatigue, dizziness, headache, trouble breathing Muscle injury--unusual weakness or fatigue, muscle pain, dark yellow or brown urine, decrease in amount of urine Pain, tingling, or numbness in the hands or feet Sudden and severe headache, confusion, change in vision, seizures, which may be signs of posterior reversible encephalopathy syndrome (PRES) Unusual bruising or bleeding Side effects that usually do not require medical attention (report to your care team if they continue or are bothersome): Diarrhea Nausea Pain, redness, or swelling with sores inside the mouth or throat Unusual weakness or fatigue Vomiting This list may not describe all possible side effects. Call your doctor for medical advice about side effects. You may report side effects to FDA at 1-800-FDA-1088. Where should I keep my medication? This medication is given in a hospital or clinic. It will not be stored at home. NOTE: This sheet is a summary. It may not cover all possible information. If you have questions about this medicine, talk to your doctor, pharmacist, or health care provider.  2024 Elsevier/Gold Standard (2023-11-09 00:00:00)  Fluorouracil  Injection What is this medication? FLUOROURACIL  (flure oh YOOR a sil) treats some types of cancer. It works by slowing down the growth of cancer cells. This medicine may be used for other purposes; ask your health care provider or pharmacist if you have questions. COMMON BRAND NAME(S): Adrucil  What should I tell my care team before I take this medication? They need to know if you have any of these conditions: Blood disorders Dihydropyrimidine dehydrogenase (DPD) deficiency Infection, such as chickenpox, cold sores, herpes Kidney disease Liver disease Poor nutrition Recent or ongoing radiation therapy An unusual or allergic reaction to fluorouracil , other medications, foods, dyes, or preservatives If you or your partner are pregnant or  trying to get pregnant Breast-feeding How should I use this medication? This medication is injected into a vein. It is administered by your care team in a hospital or clinic setting. Talk to your care team about the use of this medication in children. Special care may be needed. Overdosage: If you think you have taken too much of this medicine contact a poison control center or emergency room at once. NOTE: This medicine is only for you. Do not share this medicine with others. What if I miss a dose? Keep appointments for follow-up doses. It is important not to miss your dose. Call your care team if you are unable to keep an appointment. What may interact with this medication? Do not take this medication with any of the following: Live virus vaccines This medication may also interact with the following: Medications that treat or prevent blood clots, such as  warfarin, enoxaparin, dalteparin This list may not describe all possible interactions. Give your health care provider a list of all the medicines, herbs, non-prescription drugs, or dietary supplements you use. Also tell them if you smoke, drink alcohol , or use illegal drugs. Some items may interact with your medicine. What should I watch for while using this medication? Your condition will be monitored carefully while you are receiving this medication. This medication may make you feel generally unwell. This is not uncommon as chemotherapy can affect healthy cells as well as cancer cells. Report any side effects. Continue your course of treatment even though you feel ill unless your care team tells you to stop. In some cases, you may be given additional medications to help with side effects. Follow all directions for their use. This medication may increase your risk of getting an infection. Call your care team for advice if you get a fever, chills, sore throat, or other symptoms of a cold or flu. Do not treat yourself. Try to avoid being around  people who are sick. This medication may increase your risk to bruise or bleed. Call your care team if you notice any unusual bleeding. Be careful brushing or flossing your teeth or using a toothpick because you may get an infection or bleed more easily. If you have any dental work done, tell your dentist you are receiving this medication. Avoid taking medications that contain aspirin , acetaminophen , ibuprofen , naproxen, or ketoprofen unless instructed by your care team. These medications may hide a fever. Do not treat diarrhea with over the counter products. Contact your care team if you have diarrhea that lasts more than 2 days or if it is severe and watery. This medication can make you more sensitive to the sun. Keep out of the sun. If you cannot avoid being in the sun, wear protective clothing and sunscreen. Do not use sun lamps, tanning beds, or tanning booths. Talk to your care team if you or your partner wish to become pregnant or think you might be pregnant. This medication can cause serious birth defects if taken during pregnancy and for 3 months after the last dose. A reliable form of contraception is recommended while taking this medication and for 3 months after the last dose. Talk to your care team about effective forms of contraception. Do not father a child while taking this medication and for 3 months after the last dose. Use a condom while having sex during this time period. Do not breastfeed while taking this medication. This medication may cause infertility. Talk to your care team if you are concerned about your fertility. What side effects may I notice from receiving this medication? Side effects that you should report to your care team as soon as possible: Allergic reactions--skin rash, itching, hives, swelling of the face, lips, tongue, or throat Heart attack--pain or tightness in the chest, shoulders, arms, or jaw, nausea, shortness of breath, cold or clammy skin, feeling faint or  lightheaded Heart failure--shortness of breath, swelling of the ankles, feet, or hands, sudden weight gain, unusual weakness or fatigue Heart rhythm changes--fast or irregular heartbeat, dizziness, feeling faint or lightheaded, chest pain, trouble breathing High ammonia level--unusual weakness or fatigue, confusion, loss of appetite, nausea, vomiting, seizures Infection--fever, chills, cough, sore throat, wounds that don't heal, pain or trouble when passing urine, general feeling of discomfort or being unwell Low red blood cell level--unusual weakness or fatigue, dizziness, headache, trouble breathing Pain, tingling, or numbness in the hands or feet, muscle weakness, change  in vision, confusion or trouble speaking, loss of balance or coordination, trouble walking, seizures Redness, swelling, and blistering of the skin over hands and feet Severe or prolonged diarrhea Unusual bruising or bleeding Side effects that usually do not require medical attention (report to your care team if they continue or are bothersome): Dry skin Headache Increased tears Nausea Pain, redness, or swelling with sores inside the mouth or throat Sensitivity to light Vomiting This list may not describe all possible side effects. Call your doctor for medical advice about side effects. You may report side effects to FDA at 1-800-FDA-1088. Where should I keep my medication? This medication is given in a hospital or clinic. It will not be stored at home. NOTE: This sheet is a summary. It may not cover all possible information. If you have questions about this medicine, talk to your doctor, pharmacist, or health care provider.  2024 Elsevier/Gold Standard (2022-04-04 00:00:00)   To help prevent nausea and vomiting after your treatment, we encourage you to take your nausea medication as directed.  BELOW ARE SYMPTOMS THAT SHOULD BE REPORTED IMMEDIATELY: *FEVER GREATER THAN 100.4 F (38 C) OR HIGHER *CHILLS OR  SWEATING *NAUSEA AND VOMITING THAT IS NOT CONTROLLED WITH YOUR NAUSEA MEDICATION *UNUSUAL SHORTNESS OF BREATH *UNUSUAL BRUISING OR BLEEDING *URINARY PROBLEMS (pain or burning when urinating, or frequent urination) *BOWEL PROBLEMS (unusual diarrhea, constipation, pain near the anus) TENDERNESS IN MOUTH AND THROAT WITH OR WITHOUT PRESENCE OF ULCERS (sore throat, sores in mouth, or a toothache) UNUSUAL RASH, SWELLING OR PAIN  UNUSUAL VAGINAL DISCHARGE OR ITCHING   Items with * indicate a potential emergency and should be followed up as soon as possible or go to the Emergency Department if any problems should occur.  Please show the CHEMOTHERAPY ALERT CARD or IMMUNOTHERAPY ALERT CARD at check-in to the Emergency Department and triage nurse.  Should you have questions after your visit or need to cancel or reschedule your appointment, please contact [facility]CER CTR [place] - A DEPT OF [name] [phone]  and follow the prompts.  Office hours are 8:00 a.m. to 4:30 p.m. Monday - Friday. Please note that voicemails left after 4:00 p.m. may not be returned until the following business day.  We are closed weekends and major holidays. You have access to a nurse at all times for urgent questions. Please call the main number to the clinic [phone] and follow the prompts.  For any non-urgent questions, you may also contact your provider using MyChart. We now offer e-Visits for anyone 69 and older to request care online for non-urgent symptoms. For details visit [email or web address].   Also download the MyChart app! Go to the app store, search MyChart, open the app, select , and log in with your MyChart username and password.

## 2024-12-08 NOTE — Progress Notes (Signed)
 Patient presents today for chemotherapy/immunotherapy infusion of MVASI , Leucovorin , irinotecan , and Adrucil . Patient is in satisfactory condition with no new complaints voiced.  Vital signs are stable.  Labs reviewed by Dr. Davonna during the office visit and all labs are within treatment parameters. Per Dr Davonna patient has progression of cancer, treatment to be changed to Folfox. Consent signed, education done. We will proceed with treatment per MD orders.   Patient tolerated treatment well with no complaints voiced.  Patient left ambulatory in stable condition.  Vital signs stable at discharge.  Follow up as scheduled.

## 2024-12-08 NOTE — Patient Instructions (Signed)
 [facility] at [facility] Discharge Instructions   You were seen and examined today by Dr. Davonna.  She reviewed the results of your lab work which are normal/stable.   She reviewed the results of your CT scan which is showing the cancer is growing. We will need to change treatment. We will switch out the irinotecan  you have been receiving with a drug called oxaliplatin . This is still given in the clinic every 2 weeks and wear the pump.   We will proceed with your treatment today.   Return as scheduled.    Thank you for choosing [facility] to provide your oncology and hematology care.  To afford each patient quality time with our provider, please arrive at least 15 minutes before your scheduled appointment time.   If you have a lab appointment with the Cancer Center please come in thru the Main Entrance and check in at the main information desk.  You need to re-schedule your appointment should you arrive 10 or more minutes late.  We strive to give you quality time with our providers, and arriving late affects you and other patients whose appointments are after yours.  Also, if you no show three or more times for appointments you may be dismissed from the clinic at the providers discretion.     Again, thank you for choosing [facility].  Our hope is that these requests will decrease the amount of time that you wait before being seen by our physicians.       _____________________________________________________________  Should you have questions after your visit to [facility], please contact our office at [phone] and follow the prompts.  Our office hours are 8:00 a.m. and 4:30 p.m. Monday - Friday.  Please note that voicemails left after 4:00 p.m. may not be returned until the following business day.  We are closed weekends and major holidays.  You do have access to a nurse 24-7, just call the main  number to the clinic [phone] and do not press any options, hold on the line and a nurse will answer the phone.    For prescription refill requests, have your pharmacy contact our office and allow 72 hours.    Due to Covid, you will need to wear a mask upon entering the hospital. If you do not have a mask, a mask will be given to you at the Main Entrance upon arrival. For doctor visits, patients may have 1 support person age 31 or older with them. For treatment visits, patients can not have anyone with them due to social distancing guidelines and our immunocompromised population.

## 2024-12-08 NOTE — Progress Notes (Unsigned)
 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 male with metastatic colon cancer  Assessment and Plan  Assessment and Plan Assessment & Plan Metastatic colorectal cancer with progression in lung and intrathoracic lymph nodes Disease progression in lungs and intrathoracic lymph nodes on recent CT after over two years of control on current chemotherapy. Initiation of oxaliplatin -based regimen anticipated to provide benefit. No neuropathy symptoms. - Changed chemotherapy regimen to include oxaliplatin  due to progression. - Discussed risks of oxaliplatin , including cold sensitivity, numbness, and paresthesias; advised to avoid cold drinks for one week and to wear gloves and a scarf in cold weather. - Planned to administer new chemotherapy regimen today pending insurance authorization. - Scheduled follow-up in one month. - Planned repeat imaging in three months to assess response.  Neoplasm-related pain Chest pain well controlled with scheduled oxycodone . - Reviewed pain management regimen and confirmed continued use of oxycodone  every four hours.  Oral mucositis secondary to chemotherapy Intermittent oral mucositis secondary to chemotherapy. Currently out of Magic Mouthwash. - Sent prescription for [employer].  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 Extensive oncology history below. Patient currently on FOLFIRI and bevacizumab (1st line) and tolerating well.  5-FU and irinotecan  dose reduced for mucositis.  -Labs reviewed today: CMP: Creatinine: Normal, normal LFTs.  CBC: WBC: 13, hemoglobin: 11.1, platelets: 219.  CEA  pending from today. -Last CT scan with some enlarging lymph nodes.  CEA trending down.  Will repeat CT scan in 3 months I.e due in 11/2024.  Will order today. - Patient did not receive oxaliplatin  before.  Will consider FOLFOX plus bevacizumab  on progression. -UA with 2+ proteinuria.  Previous 24-hour urine protein is 360. -Physical exam stable today.  Proceed with treatment today. - Continue chemotherapy every 2 weeks.  Return to clinic in 4 weeks for follow-up and CT scan.  Diarrhea secondary to cancer treatment Recurrent diarrhea managed with Imodium .  - Advised to continue use of Imodium  as needed for diarrhea.  Insomnia Chronic insomnia. Ativan  ineffective. Screen time and late meals possible factors.  - Improved sleep hygiene - Has Ativan  available as needed.  Patient is not taking it.  Chronic obstructive pulmonary disease (COPD) Mild wheezing likely due to smoking. No inhaler use.  - Advise smoking cessation to improve respiratory symptoms.  Nicotine dependence, current smoker Resumed smoking 1-2 cigarettes per day after quitting.  - Advise smoking cessation.  Mucositis Improved after dose reducing chemotherapy  -Continue to use Magic mouthwash as needed  Anemia Likely multifactorial-iron deficiency, anemia of chronic disease, immunosuppression from chemotherapy.  Last nutritional panel did not show iron deficiency  - Need to monitor for now.  Chronic pain - Continue oxycodone  10 mg every 4 hours as needed   The patient understands the plans discussed today and is in agreement with them.  He knows to contact our office if he develops concerns prior to his next appointment.  The total time spent in the appointment was 20 minutes for the encounter with patient, including review of chart and various tests results, discussions about plan of care and coordination of care plan   Evan Dry, MD  [place]  [facility] CANC[facility] CTR [place] - A DEPT OF  Evan [name] [facility] [address] [place] [address] Dept: [phone] Dept Fax: [phone]   No orders of the defined types were placed in this encounter.    ONCOLOGY HISTORY:   I have reviewed his chart and materials related to his cancer extensively and collaborated history with the patient. Summary of oncologic history is as follows:   Diagnosis: Metastatic colon cancer to lungs and adrenal gland  -Presentation: [name] cough for 6 months, 30 pound weight loss in 2 years -12/16/2021: CT chest with contrast: Bulky left hilar mass centered about the superior segment left lower lobe, measuring 8.1 x 7.5 cm. Numerous bilateral pulmonary masses and nodules of varying sizes. Numerous bulky mediastinal and bilateral hilar lymph nodes. New left adrenal nodule. -12/26/2021: CEA: 15.6 -01/03/2022: CT abdomen and pelvis: Isolated left adrenal metastasis, as on chest CT. No other evidence of metastatic disease within the abdomen or pelvis. -01/03/2022: MRI brain: No evidence of intracranial metastatic disease. -01/17/2022: Left lung core needle biopsy: Adenocarcinoma with associated necrosis  -By immunohistochemistry, the neoplastic cells are positive for CK20 and CDX2 but negative for cytokeratin 7.   -The morphology and immunophenotype are consistent with a primary colonic adenocarcinoma.  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  -09/12/2023: Added bevacizumab  secondary to progression  -5-FU and irinotecan  dose reduced for mucositis -05/17/2022: CT CAP: Mediastinal and hilar lymph nodes have decreased in size. Largest perihilar left lower lobe lung mass has  decreased in size. Right upper lobe mass slightly increased in size. Other nodules are stable. Left adrenal mass decreased to 1.3 cm from 2.8 cm.  -07/2022-01/2023: Stable findings on CT CAP -09/05/2023: CT CAP: Increased size of dominant left lower lobe pulmonary nodule with new central occlusion of the superior left upper lobe segmental bronchus. Additional bilateral irregular solid pulmonary nodules are stable or slightly increased in size. Increased size of right hilar lymph node. Stable pleural metastatic disease. Stable left adrenal gland nodule. - 11/07/2022: CEA: 62.6 - 10/29/2023: CEA: 159 - 12/2023-06/2024: Stable findings on CT CAP -07/28/2024: CEA: 211 - 09/02/2024: CEA: 195 - 09/08/2024: CT CAP: Slight interval increase in size of bilateral metastatic pulmonary nodules and masses (12 to 16 mm). Increased size of a subcarinal lymph node(9 mm to 14 mm) with similar size of the additional supraclavicular, mediastinal and hilar lymph nodes. Stable size of the left adrenal metastasis.   Current Treatment:  FOLFIRI + Bevacizumab   INTERVAL HISTORY:  Discussed the use of AI scribe software for clinical note transcription with the patient, who gave verbal consent to proceed.  History of Present Illness Evan [name] is a 68 year old male with metastatic colorectal cancer involving the lungs and intrathoracic lymph nodes who presents for oncology follow-up and management of neoplasm-related pain and oral mucositis.  He remains on infusion and pump-based chemotherapy.  He reports chest pain localized to the chest, which is well controlled with oxycodone  every four hours. He denies any new or worsening pain symptoms.  He intermittently develops oral mucositis with recurrent mouth sores. He uses Magic Mouthwash for symptomatic relief but has recently run out and requests a refill.  He denies numbness or tingling in  his hands and feet. Sleep is currently good and improved compared to  prior visits, and he is not taking Ativan  for sleep. He continues to smoke approximately one pack every two days and is attempting cessation.    I have reviewed the past medical history, past surgical history, social history and family history with the patient and they are unchanged from previous note.  ALLERGIES:  has no allergies on file.  MEDICATIONS:  Current Outpatient Medications  Medication Sig Dispense Refill   aluminum -magnesium  hydroxide-simethicone  (MAALOX) 200-200-20 MG/5ML SUSP Take 30 mLs by mouth 4 (four) times daily -  before meals and at bedtime. 1680 mL 2   amLODipine  (NORVASC ) 10 MG tablet Take 1 tablet (10 mg total) by mouth daily. 90 tablet 3   Bevacizumab  (AVASTIN  IV) Inject into the vein every 14 (fourteen) days. *start date TBD     cyclobenzaprine  (FLEXERIL ) 10 MG tablet Take 1 tablet (10 mg total) by mouth at bedtime. 5 tablet 0   dexamethasone  (DECADRON ) 0.5 MG/5ML solution 10ml swish and spit twice daily for 5 days after each chemo treatment 240 mL 3   ENULOSE  10 GM/15ML SOLN Take 10 g by mouth daily as needed.     fluorouracil  CALGB 19297 2,400 mg/m2 in sodium chloride  0.9 % 150 mL Inject 2,400 mg/m2 into the vein over 48 hr.     FLUOROURACIL  IV Inject into the vein every 14 (fourteen) days.     Lactulose  20 GM/30ML SOLN Take 15 mLs (10 g total) by mouth at bedtime. Take 15 ml at bedtime every night to assist with regular bowel movements.  Titrate down if having multiple bowel movements.  If a bowel movement has not occurred in 3 to 4 days or longer, then take 15 ml every 3 hours until a bowel movent has occurred. 450 mL 5   LEUCOVORIN  CALCIUM  IV Inject into the vein every 14 (fourteen) days.     lidocaine -prilocaine  (EMLA ) cream Apply 1 Application topically as needed (Apply to port site 30 mins- 1 hour prior to treatment/labs.). 30 g 0   lisinopril  (ZESTRIL ) 10 MG tablet TAKE ONE TABLET BY MOUTH DAILY 30 tablet 3   loperamide  (IMODIUM ) 2 MG capsule TAKE 2 CAPSULES  BY MOUTH AFTER FIRST LOOSE STOOL,AND THEN 1 CAPSULE AFTER 2ND LOOSE STOOL. DO NOT EXCEED 8 CAPS IN 24 HOURS 30 capsule 0   LORazepam  (ATIVAN ) 0.5 MG tablet Take 1 tablet (0.5 mg total) by mouth at bedtime. 30 tablet 2   magic mouthwash w/lidocaine  SOLN Take 5 mLs by mouth 4 (four) times daily as needed for mouth pain. 250 mL 1   megestrol  (MEGACE ) 400 MG/10ML suspension Take 10 mLs (400 mg total) by mouth 2 (two) times daily. 480 mL 3   Melatonin 5 MG CAPS Take 1 capsule (5 mg total) by mouth at bedtime. Take one capsule by mouth every evening at 7 pm 30 capsule 3   Misc. Devices MISC Please provide patient with 1:1 ratio of magic mouthwash and lidocaine  to swish and swallow QID 1 each 3   naloxone  (NARCAN ) nasal spray 4 mg/0.1 mL 0.1ml nasal spray in one nostril. May repeat the dose in other nostril in 2-3 minutes if needed. 2 each 3   Oxycodone  HCl 10 MG TABS Take 1 tablet (10 mg total) by mouth every 4 (four) hours as needed. 180 tablet 0   pantoprazole  (PROTONIX ) 40 MG tablet Take 1 tablet (40 mg total) by mouth daily. 30 tablet 11   potassium chloride   SA (KLOR-CON  M) 20 MEQ tablet Take 1 tablet (20 mEq total) by mouth daily. 90 tablet 4   potassium chloride  SA (KLOR-CON  M) 20 MEQ tablet Take 1 tablet (20 mEq total) by mouth 2 (two) times daily. 14 tablet 0   prochlorperazine  (COMPAZINE ) 10 MG tablet Take 1 tablet (10 mg total) by mouth every 6 (six) hours as needed (NAUSEA). 60 tablet 2   No current facility-administered medications for this visit.   Facility-Administered Medications Ordered in Other Visits  Medication Dose Route Frequency Provider Last Rate Last Admin   potassium chloride  SA (KLOR-CON  M) CR tablet 40 mEq  40 mEq Oral Once Katragadda, Sreedhar, MD        VITALS:  There were no vitals taken for this visit.  Wt Readings from Last 3 Encounters:  11/24/24 180 lb 12.4 oz (82 kg)  11/11/24 178 lb 8 oz (81 kg)  10/27/24 175 lb 3.2 oz (79.5 kg)    There is no height or weight  on file to calculate BMI.  Performance status (ECOG): 1 - Symptomatic but completely ambulatory  PHYSICAL EXAM:   GENERAL:alert, no distress and comfortable SKIN: skin color, texture, turgor are normal, no rashes or significant lesions LYMPH:  no palpable lymphadenopathy in the cervical, axillary or inguinal LUNGS: clear to auscultation and percussion with normal breathing effort HEART: regular rate & rhythm and no murmurs and no lower extremity edema ABDOMEN:abdomen soft, non-tender and normal bowel sounds Musculoskeletal:no cyanosis of digits and no clubbing  NEURO: alert & oriented x 3 with fluent speech  LABORATORY DATA:  I have reviewed the data as listed   Lab Results  Component Value Date   WBC 21.1 (H) 11/24/2024   NEUTROABS 16.5 (H) 11/24/2024   HGB 11.0 (L) 11/24/2024   HCT 34.8 (L) 11/24/2024   MCV 93.8 11/24/2024   PLT 177 11/24/2024      Chemistry      Component Value Date/Time   NA 143 11/24/2024 0954   K 4.0 11/24/2024 0954   CL 109 11/24/2024 0954   CO2 22 11/24/2024 0954   BUN 7 (L) 11/24/2024 0954   CREATININE 1.05 11/24/2024 0954      Component Value Date/Time   CALCIUM  8.6 (L) 11/24/2024 0954   ALKPHOS 153 (H) 11/24/2024 0954   AST 27 11/24/2024 0954   ALT 10 11/24/2024 0954   BILITOT 0.2 11/24/2024 0954        Latest Reference Range & Units 11/11/24 08:10  Appearance CLEAR  CLEAR  Bilirubin Urine NEGATIVE  NEGATIVE  Color, Urine YELLOW  YELLOW  Glucose, UA NEGATIVE mg/dL NEGATIVE  Hgb urine dipstick NEGATIVE  NEGATIVE  Ketones, ur NEGATIVE mg/dL NEGATIVE  Leukocytes,Ua NEGATIVE  NEGATIVE  Nitrite NEGATIVE  NEGATIVE  pH 5.0 - 8.0  6.0  Protein NEGATIVE mg/dL 899 !  Specific Gravity, Urine 1.005 - 1.030  1.018  !: Data is abnormal   Latest Reference Range & Units 08/21/24 10:31  Urine Total Volume-UPROT mL 1,000  Collection Interval-UPROT hours 24  Protein, Urine mg/dL 36  Protein, 75Y Urine 50 - 100 mg/day 360 (H)  (H): Data is  abnormally high  RADIOGRAPHIC STUDIES: I have personally reviewed the radiological images as listed and agreed with the findings in the report.  CT CHEST ABDOMEN PELVIS W CONTRAST CLINICAL DATA:  Colon cancer, stage IV, monitor. * Tracking Code: BO *  EXAM: CT CHEST, ABDOMEN, AND PELVIS WITH CONTRAST  TECHNIQUE: Multidetector CT imaging of the chest, abdomen and pelvis  was performed following the standard protocol during bolus administration of intravenous contrast.  RADIATION DOSE REDUCTION: This exam was performed according to the departmental dose-optimization program which includes automated exposure control, adjustment of the mA and/or kV according to patient size and/or use of iterative reconstruction technique.  CONTRAST:  75mL OMNIPAQUE  IOHEXOL  350 MG/ML SOLN  COMPARISON:  Multiple priors including CT September 08, 2024  FINDINGS: CT CHEST FINDINGS  Cardiovascular: Accessed right chest Port-A-Cath with tip in the SVC. Aortic atherosclerosis. Normal size heart. No significant pericardial effusion/thickening.  Mediastinum/Nodes: Increased size of the supraclavicular mediastinal and right hilar lymph nodes. For reference:  -right supraclavicular lymph node measures 16 mm in short axis on image 8/2 previously 10 mm  -right hilar lymph node measures 18 mm in short axis on image 29/2 previously 16 mm  -subcarinal lymph node measures 2.6 cm in short axis on image 34/2 previously 18 mm when remeasured for consistency.  Lungs/Pleura: Increase in size and number of the bilateral pulmonary nodules and masses. For reference:  -left lower lobe pulmonary mass measures 8.3 x 6.1 cm on image 93/5 previously 8.0 x 4.7 cm  -new 7 mm pulmonary nodule in the lingula on image 93/5.  -right upper lobe pulmonary mass measures 3.2 x 2.5 cm on image 53/5 previously 2.6 x 2.5 cm.  Musculoskeletal: No aggressive lytic or blastic lesion of bone. Thoracic spondylosis. Surgical  fixation screws in the bilateral shoulder girdle. Degenerative change of the bilateral glenohumeral joints.  CT ABDOMEN PELVIS FINDINGS  Hepatobiliary: No suspicious hepatic lesion. Subtle contour lobulations similar prior. Gallbladder is unremarkable. No biliary ductal dilation.  Pancreas: No pancreatic ductal dilation or evidence of acute inflammation.  Spleen: Similar mild splenomegaly.  Adrenals/Urinary Tract: Similar nodular thickening of the left adrenal gland measuring 13 mm on image 58/2. Similar smooth thickening of the right adrenal gland. No hydronephrosis. Kidneys demonstrate symmetric enhancement. Urinary bladder is unremarkable for degree of distension.  Stomach/Bowel: Radiopaque enteric contrast material traverses the descending colon. Stomach is unremarkable for degree of distension. No pathologic dilation of small or large bowel.  Prior right hemicolectomy with right upper quadrant ileocolic anastomosis. No new suspicious nodularity along the suture line.  Vascular/Lymphatic: Aortic atherosclerosis. Normal caliber abdominal aorta. Smooth IVC contours. The portal, splenic and superior mesenteric veins are patent. No pathologically enlarged abdominal or pelvic lymph nodes.  Reproductive: Obscured by streak artifact from right hip arthroplasty.  Other: No significant abdominopelvic free fluid. No discrete peritoneal or omental nodularity identified.  Musculoskeletal: No aggressive lytic or blastic lesion of bone. Right total hip arthroplasty. Spondylosis. Degenerative change of the left hip.  IMPRESSION: 1. Increased burden of pulmonary metastatic disease. 2. Increased burden of supraclavicular/thoracic metastatic adenopathy. 3. Similar size of the left adrenal metastasis. 4. Cirrhosis with sequela portal venous hypertension.  Electronically Signed   By: Reyes Holder M.D.   On: 12/02/2024 08:16   "

## 2024-12-08 NOTE — Progress Notes (Signed)
 Pharmacist Chemotherapy Monitoring - Initial Assessment    Anticipated start date: 12/08/24   The following has been reviewed per standard work regarding the patient's treatment regimen: The patient's diagnosis, treatment plan and drug doses, and organ/hematologic function Lab orders and baseline tests specific to treatment regimen  The treatment plan start date, drug sequencing, and pre-medications Prior authorization status  Patient's documented medication list, including drug-drug interaction screen and prescriptions for anti-emetics and supportive care specific to the treatment regimen The drug concentrations, fluid compatibility, administration routes, and timing of the medications to be used The patient's access for treatment and lifetime cumulative dose history, if applicable  The patient's medication allergies and previous infusion related reactions, if applicable   Changes made to treatment plan:  No change  Follow up needed:  N/A  PA approved for today. Proceed with urine protein 100 - previous 24 hour urine protein at 360- Ok per Dr Davonna.  Adding Emend 150 mg IVPB with premedication due to previous nausea/vomiting.  Maintain 5FU CIV at 1920 mg/m2 due to previous issue with mucositis and 5FU push at 320 mg/m2 - V.O. Dr Davonna.     Evan [name], [facility], 12/08/2024  10:34 AM

## 2024-12-09 ENCOUNTER — Other Ambulatory Visit: Payer: Self-pay

## 2024-12-09 LAB — CEA: CEA: 239 ng/mL — ABNORMAL HIGH (ref 0.0–4.7)

## 2024-12-10 ENCOUNTER — Inpatient Hospital Stay

## 2024-12-10 VITALS — BP 162/96 | HR 84 | Temp 97.3°F | Resp 20

## 2024-12-10 DIAGNOSIS — Z95828 Presence of other vascular implants and grafts: Secondary | ICD-10-CM

## 2024-12-10 DIAGNOSIS — C180 Malignant neoplasm of cecum: Secondary | ICD-10-CM

## 2024-12-10 NOTE — Patient Instructions (Signed)
 [facility] [place] - A DEPT OF [facility]  Discharge Instructions: Thank you for choosing [facility] to provide your oncology and hematology care.  If you have a lab appointment with the Cancer Center - please note that after April 8th, 2024, all labs will be drawn in the cancer center.  You do not have to check in or register with the main entrance as you have in the past but will complete your check-in in the cancer center.  Wear comfortable clothing and clothing appropriate for easy access to any Portacath or PICC line.   We strive to give you quality time with your provider. You may need to reschedule your appointment if you arrive late (15 or more minutes).  Arriving late affects you and other patients whose appointments are after yours.  Also, if you miss three or more appointments without notifying the office, you may be dismissed from the clinic at the provider's discretion.      For prescription refill requests, have your pharmacy contact our office and allow 72 hours for refills to be completed.    Today you received the following chemotherapy and/or immunotherapy agents pump dc      To help prevent nausea and vomiting after your treatment, we encourage you to take your nausea medication as directed.  BELOW ARE SYMPTOMS THAT SHOULD BE REPORTED IMMEDIATELY: *FEVER GREATER THAN 100.4 F (38 C) OR HIGHER *CHILLS OR SWEATING *NAUSEA AND VOMITING THAT IS NOT CONTROLLED WITH YOUR NAUSEA MEDICATION *UNUSUAL SHORTNESS OF BREATH *UNUSUAL BRUISING OR BLEEDING *URINARY PROBLEMS (pain or burning when urinating, or frequent urination) *BOWEL PROBLEMS (unusual diarrhea, constipation, pain near the anus) TENDERNESS IN MOUTH AND THROAT WITH OR WITHOUT PRESENCE OF ULCERS (sore throat, sores in mouth, or a toothache) UNUSUAL RASH, SWELLING OR PAIN  UNUSUAL VAGINAL DISCHARGE OR ITCHING   Items with * indicate a potential emergency and should be followed up  as soon as possible or go to the Emergency Department if any problems should occur.  Please show the CHEMOTHERAPY ALERT CARD or IMMUNOTHERAPY ALERT CARD at check-in to the Emergency Department and triage nurse.  Should you have questions after your visit or need to cancel or reschedule your appointment, please contact [facility] CANCER CTR [place] - A DEPT OF [facility] [phone]  and follow the prompts.  Office hours are 8:00 a.m. to 4:30 p.m. Monday - Friday. Please note that voicemails left after 4:00 p.m. may not be returned until the following business day.  We are closed weekends and major holidays. You have access to a nurse at all times for urgent questions. Please call the main number to the clinic [phone] and follow the prompts.  For any non-urgent questions, you may also contact your provider using MyChart. We now offer e-Visits for anyone 56 and older to request care online for non-urgent symptoms. For details visit [email or web address].   Also download the MyChart app! Go to the app store, search "MyChart", open the app, select [place], and log in with your MyChart username and password.

## 2024-12-10 NOTE — Progress Notes (Signed)
 Patient presents today for pump d/c. Vital signs are stable. Port a cath site clean, dry, and intact. Port flushed with 20mls of normal saline and needle removed intact. Band aid applied. Patient has no complaints at this time. Discharged from clinic ambulatory and in stable condition. Patient alert and oriented.

## 2024-12-11 ENCOUNTER — Encounter (HOSPITAL_COMMUNITY): Payer: Self-pay | Admitting: Oncology

## 2024-12-11 ENCOUNTER — Encounter: Payer: Self-pay | Admitting: Oncology

## 2024-12-18 ENCOUNTER — Other Ambulatory Visit: Payer: Self-pay | Admitting: Oncology

## 2024-12-22 ENCOUNTER — Other Ambulatory Visit: Payer: Self-pay | Admitting: *Deleted

## 2024-12-22 DIAGNOSIS — K5900 Constipation, unspecified: Secondary | ICD-10-CM

## 2024-12-22 DIAGNOSIS — C180 Malignant neoplasm of cecum: Secondary | ICD-10-CM

## 2024-12-22 DIAGNOSIS — C189 Malignant neoplasm of colon, unspecified: Secondary | ICD-10-CM

## 2024-12-22 MED ORDER — LACTULOSE 20 GM/30ML PO SOLN: 10.0000 g | Freq: Every day | ORAL | 5 refills | Status: AC

## 2024-12-22 MED ORDER — OXYCODONE HCL 10 MG PO TABS: 10.0000 mg | ORAL_TABLET | ORAL | 0 refills | Status: DC | PRN

## 2024-12-23 ENCOUNTER — Other Ambulatory Visit: Payer: Self-pay

## 2024-12-24 ENCOUNTER — Inpatient Hospital Stay

## 2024-12-24 ENCOUNTER — Inpatient Hospital Stay: Attending: Hematology

## 2024-12-24 VITALS — BP 139/89 | HR 98 | Temp 98.0°F | Resp 18 | Wt 179.0 lb

## 2024-12-24 VITALS — BP 145/83 | HR 93 | Temp 97.9°F | Resp 19

## 2024-12-24 DIAGNOSIS — Z95828 Presence of other vascular implants and grafts: Secondary | ICD-10-CM

## 2024-12-24 DIAGNOSIS — C7801 Secondary malignant neoplasm of right lung: Secondary | ICD-10-CM | POA: Diagnosis not present

## 2024-12-24 DIAGNOSIS — C189 Malignant neoplasm of colon, unspecified: Secondary | ICD-10-CM

## 2024-12-24 DIAGNOSIS — C7972 Secondary malignant neoplasm of left adrenal gland: Secondary | ICD-10-CM | POA: Insufficient documentation

## 2024-12-24 DIAGNOSIS — C7802 Secondary malignant neoplasm of left lung: Secondary | ICD-10-CM | POA: Diagnosis not present

## 2024-12-24 DIAGNOSIS — C180 Malignant neoplasm of cecum: Secondary | ICD-10-CM | POA: Diagnosis present

## 2024-12-24 DIAGNOSIS — Z5112 Encounter for antineoplastic immunotherapy: Secondary | ICD-10-CM | POA: Insufficient documentation

## 2024-12-24 DIAGNOSIS — Z5111 Encounter for antineoplastic chemotherapy: Secondary | ICD-10-CM | POA: Insufficient documentation

## 2024-12-24 DIAGNOSIS — C778 Secondary and unspecified malignant neoplasm of lymph nodes of multiple regions: Secondary | ICD-10-CM | POA: Insufficient documentation

## 2024-12-24 LAB — CBC WITH DIFFERENTIAL/PLATELET
Abs Immature Granulocytes: 0.03 K/uL (ref 0.00–0.07)
Basophils Absolute: 0.1 K/uL (ref 0.0–0.1)
Basophils Relative: 1 %
Eosinophils Absolute: 0.3 K/uL (ref 0.0–0.5)
Eosinophils Relative: 4 %
HCT: 34.5 % — ABNORMAL LOW (ref 39.0–52.0)
Hemoglobin: 11 g/dL — ABNORMAL LOW (ref 13.0–17.0)
Immature Granulocytes: 0 %
Lymphocytes Relative: 23 %
Lymphs Abs: 1.7 K/uL (ref 0.7–4.0)
MCH: 29.2 pg (ref 26.0–34.0)
MCHC: 31.9 g/dL (ref 30.0–36.0)
MCV: 91.5 fL (ref 80.0–100.0)
Monocytes Absolute: 1.5 K/uL — ABNORMAL HIGH (ref 0.1–1.0)
Monocytes Relative: 19 %
Neutro Abs: 4 K/uL (ref 1.7–7.7)
Neutrophils Relative %: 53 %
Platelets: 274 K/uL (ref 150–400)
RBC: 3.77 MIL/uL — ABNORMAL LOW (ref 4.22–5.81)
RDW: 19.1 % — ABNORMAL HIGH (ref 11.5–15.5)
WBC: 7.5 K/uL (ref 4.0–10.5)
nRBC: 0 % (ref 0.0–0.2)

## 2024-12-24 LAB — COMPREHENSIVE METABOLIC PANEL WITH GFR
ALT: 7 U/L (ref 0–44)
AST: 33 U/L (ref 15–41)
Albumin: 3.6 g/dL (ref 3.5–5.0)
Alkaline Phosphatase: 67 U/L (ref 38–126)
Anion gap: 13 (ref 5–15)
BUN: 31 mg/dL — ABNORMAL HIGH (ref 8–23)
CO2: 22 mmol/L (ref 22–32)
Calcium: 8.9 mg/dL (ref 8.9–10.3)
Chloride: 101 mmol/L (ref 98–111)
Creatinine, Ser: 1.34 mg/dL — ABNORMAL HIGH (ref 0.61–1.24)
GFR, Estimated: 58 mL/min — ABNORMAL LOW
Glucose, Bld: 97 mg/dL (ref 70–99)
Potassium: 4.7 mmol/L (ref 3.5–5.1)
Sodium: 137 mmol/L (ref 135–145)
Total Bilirubin: 0.4 mg/dL (ref 0.0–1.2)
Total Protein: 7.3 g/dL (ref 6.5–8.1)

## 2024-12-24 LAB — URINALYSIS, DIPSTICK ONLY
Bilirubin Urine: NEGATIVE
Glucose, UA: NEGATIVE mg/dL
Hgb urine dipstick: NEGATIVE
Ketones, ur: NEGATIVE mg/dL
Leukocytes,Ua: NEGATIVE
Nitrite: NEGATIVE
Protein, ur: 100 mg/dL — AB
Specific Gravity, Urine: 1.024 (ref 1.005–1.030)
pH: 5 (ref 5.0–8.0)

## 2024-12-24 MED ORDER — SODIUM CHLORIDE 0.9 % IV SOLN: INTRAVENOUS | Status: DC

## 2024-12-24 MED ORDER — DEXAMETHASONE SOD PHOSPHATE PF 10 MG/ML IJ SOLN
10.0000 mg | Freq: Once | INTRAMUSCULAR | Status: AC
  Administered 2024-12-24: 10 mg via INTRAVENOUS
  Filled 2024-12-24: qty 1

## 2024-12-24 MED ORDER — FLUOROURACIL CHEMO INJECTION 2.5 GM/50ML
320.0000 mg/m2 | Freq: Once | INTRAVENOUS | Status: AC
  Administered 2024-12-24: 650 mg via INTRAVENOUS
  Filled 2024-12-24: qty 13

## 2024-12-24 MED ORDER — PALONOSETRON HCL INJECTION 0.25 MG/5ML
0.2500 mg | Freq: Once | INTRAVENOUS | Status: AC
  Administered 2024-12-24: 0.25 mg via INTRAVENOUS
  Filled 2024-12-24: qty 5

## 2024-12-24 MED ORDER — DEXTROSE 5 % IV SOLN: INTRAVENOUS | Status: DC

## 2024-12-24 MED ORDER — SODIUM CHLORIDE 0.9 % IV SOLN
150.0000 mg | Freq: Once | INTRAVENOUS | Status: AC
  Administered 2024-12-24: 150 mg via INTRAVENOUS
  Filled 2024-12-24: qty 150

## 2024-12-24 MED ORDER — OXALIPLATIN CHEMO INJECTION 100 MG/20ML
85.0000 mg/m2 | Freq: Once | INTRAVENOUS | Status: AC
  Administered 2024-12-24: 170 mg via INTRAVENOUS
  Filled 2024-12-24: qty 34

## 2024-12-24 MED ORDER — SODIUM CHLORIDE 0.9 % IV SOLN
1920.0000 mg/m2 | INTRAVENOUS | Status: DC
  Administered 2024-12-24: 3850 mg via INTRAVENOUS
  Filled 2024-12-24: qty 77

## 2024-12-24 MED ORDER — LEUCOVORIN CALCIUM INJECTION 350 MG
400.0000 mg/m2 | Freq: Once | INTRAVENOUS | Status: AC
  Administered 2024-12-24: 804 mg via INTRAVENOUS
  Filled 2024-12-24: qty 40.2

## 2024-12-24 MED ORDER — SODIUM CHLORIDE 0.9 % IV SOLN
5.0000 mg/kg | Freq: Once | INTRAVENOUS | Status: AC
  Administered 2024-12-24: 400 mg via INTRAVENOUS
  Filled 2024-12-24: qty 16

## 2024-12-24 NOTE — Progress Notes (Signed)
 Patient presents today for MVASI .Folfox infusion per providers order.  Vital signs and labs within parameters for treatment.  Patient has no new complaints at this time.  Treatment given today per MD orders.  Stable during infusion without adverse affects.  Vital signs stable.  5FU pump connected and verified RUN on the screen with the patient.  No complaints at this time.  Discharge from clinic ambulatory in stable condition.  Alert and oriented X 3.  Follow up with [facility] as scheduled.

## 2024-12-24 NOTE — Progress Notes (Signed)
 Maintain 5FU CIV at 1920 mg/m2 = 3840 mg 5FU push at 320 mg/m2 = 650 mg  Patient with mucositis.  OK to proceed with bevacizumab  - 24 hour protein 360  V.O. Dr Ivery Molt, PharmD

## 2024-12-24 NOTE — Patient Instructions (Signed)
 [facility] [place] - A DEPT OF [facility]  Discharge Instructions: Thank you for choosing [facility] to provide your oncology and hematology care.  If you have a lab appointment with the Cancer Center - please note that after April 8th, 2024, all labs will be drawn in the cancer center.  You do not have to check in or register with the main entrance as you have in the past but will complete your check-in in the cancer center.  Wear comfortable clothing and clothing appropriate for easy access to any Portacath or PICC line.   We strive to give you quality time with your provider. You may need to reschedule your appointment if you arrive late (15 or more minutes).  Arriving late affects you and other patients whose appointments are after yours.  Also, if you miss three or more appointments without notifying the office, you may be dismissed from the clinic at the provider's discretion.      For prescription refill requests, have your pharmacy contact our office and allow 72 hours for refills to be completed.    Today you received the following chemotherapy and/or immunotherapy agents MVASI/Folfox      To help prevent nausea and vomiting after your treatment, we encourage you to take your nausea medication as directed.  BELOW ARE SYMPTOMS THAT SHOULD BE REPORTED IMMEDIATELY: *FEVER GREATER THAN 100.4 F (38 C) OR HIGHER *CHILLS OR SWEATING *NAUSEA AND VOMITING THAT IS NOT CONTROLLED WITH YOUR NAUSEA MEDICATION *UNUSUAL SHORTNESS OF BREATH *UNUSUAL BRUISING OR BLEEDING *URINARY PROBLEMS (pain or burning when urinating, or frequent urination) *BOWEL PROBLEMS (unusual diarrhea, constipation, pain near the anus) TENDERNESS IN MOUTH AND THROAT WITH OR WITHOUT PRESENCE OF ULCERS (sore throat, sores in mouth, or a toothache) UNUSUAL RASH, SWELLING OR PAIN  UNUSUAL VAGINAL DISCHARGE OR ITCHING   Items with * indicate a potential emergency and should be followed  up as soon as possible or go to the Emergency Department if any problems should occur.  Please show the CHEMOTHERAPY ALERT CARD or IMMUNOTHERAPY ALERT CARD at check-in to the Emergency Department and triage nurse.  Should you have questions after your visit or need to cancel or reschedule your appointment, please contact [facility] [place] - A DEPT OF [facility] [phone]  and follow the prompts.  Office hours are 8:00 a.m. to 4:30 p.m. Monday - Friday. Please note that voicemails left after 4:00 p.m. may not be returned until the following business day.  We are closed weekends and major holidays. You have access to a nurse at all times for urgent questions. Please call the main number to the clinic [phone] and follow the prompts.  For any non-urgent questions, you may also contact your provider using MyChart. We now offer e-Visits for anyone 73 and older to request care online for non-urgent symptoms. For details visit [email or web address].   Also download the MyChart app! Go to the app store, search "MyChart", open the app, select [place], and log in with your MyChart username and password.

## 2024-12-26 ENCOUNTER — Inpatient Hospital Stay

## 2024-12-26 VITALS — BP 117/84 | HR 91 | Temp 97.7°F | Resp 18

## 2024-12-26 DIAGNOSIS — C180 Malignant neoplasm of cecum: Secondary | ICD-10-CM

## 2024-12-26 DIAGNOSIS — Z95828 Presence of other vascular implants and grafts: Secondary | ICD-10-CM

## 2024-12-26 NOTE — Patient Instructions (Signed)
 [facility] [place] [facility]  Discharge Instructions: Thank you for choosing [facility] to provide your oncology and hematology care.  If you have a lab appointment with the Cancer Center - please note that after April 8th, 2024, all labs will be drawn in the cancer center.  You do not have to check in or register with the main entrance as you have in the past but will complete your check-in in the cancer center.  Wear comfortable clothing and clothing appropriate for easy access to any Portacath or PICC line.   We strive to give you quality time with your provider. You may need to reschedule your appointment if you arrive late (15 or more minutes).  Arriving late affects you and other patients whose appointments are after yours.  Also, if you miss three or more appointments without notifying the office, you may be dismissed from the clinic at the provider's discretion.      For prescription refill requests, have your pharmacy contact our office and allow 72 hours for refills to be completed.    Today you received the following chemotherapy and/or immunotherapy agents Adrucil. Fluorouracil Injection What is this medication? FLUOROURACIL (flure oh YOOR a sil) treats some types of cancer. It works by slowing down the growth of cancer cells. This medicine may be used for other purposes; ask your health care provider or pharmacist if you have questions. COMMON BRAND NAME(S): Adrucil What should I tell my care team before I take this medication? They need to know if you have any of these conditions: Blood disorders Dihydropyrimidine dehydrogenase (DPD) deficiency Infection, such as chickenpox, cold sores, herpes Kidney disease Liver disease Poor nutrition Recent or ongoing radiation therapy An unusual or allergic reaction to fluorouracil, other medications, foods, dyes, or preservatives If you or your partner are pregnant or trying to get  pregnant Breast-feeding How should I use this medication? This medication is injected into a vein. It is administered by your care team in a hospital or clinic setting. Talk to your care team about the use of this medication in children. Special care may be needed. Overdosage: If you think you have taken too much of this medicine contact a poison control center or emergency room at once. NOTE: This medicine is only for you. Do not share this medicine with others. What if I miss a dose? Keep appointments for follow-up doses. It is important not to miss your dose. Call your care team if you are unable to keep an appointment. What may interact with this medication? Do not take this medication with any of the following: Live virus vaccines This medication may also interact with the following: Medications that treat or prevent blood clots, such as warfarin, enoxaparin, dalteparin This list may not describe all possible interactions. Give your health care provider a list of all the medicines, herbs, non-prescription drugs, or dietary supplements you use. Also tell them if you smoke, drink alcohol, or use illegal drugs. Some items may interact with your medicine. What should I watch for while using this medication? Your condition will be monitored carefully while you are receiving this medication. This medication may make you feel generally unwell. This is not uncommon as chemotherapy can affect healthy cells as well as cancer cells. Report any side effects. Continue your course of treatment even though you feel ill unless your care team tells you to stop. In some cases, you may be given additional  medications to help with side effects. Follow all directions for their use. This medication may increase your risk of getting an infection. Call your care team for advice if you get a fever, chills, sore throat, or other symptoms of a cold or flu. Do not treat yourself. Try to avoid being around people who are  sick. This medication may increase your risk to bruise or bleed. Call your care team if you notice any unusual bleeding. Be careful brushing or flossing your teeth or using a toothpick because you may get an infection or bleed more easily. If you have any dental work done, tell your dentist you are receiving this medication. Avoid taking medications that contain aspirin, acetaminophen, ibuprofen, naproxen, or ketoprofen unless instructed by your care team. These medications may hide a fever. Do not treat diarrhea with over the counter products. Contact your care team if you have diarrhea that lasts more than 2 days or if it is severe and watery. This medication can make you more sensitive to the sun. Keep out of the sun. If you cannot avoid being in the sun, wear protective clothing and sunscreen. Do not use sun lamps, tanning beds, or tanning booths. Talk to your care team if you or your partner wish to become pregnant or think you might be pregnant. This medication can cause serious birth defects if taken during pregnancy and for 3 months after the last dose. A reliable form of contraception is recommended while taking this medication and for 3 months after the last dose. Talk to your care team about effective forms of contraception. Do not father a child while taking this medication and for 3 months after the last dose. Use a condom while having sex during this time period. Do not breastfeed while taking this medication. This medication may cause infertility. Talk to your care team if you are concerned about your fertility. What side effects may I notice from receiving this medication? Side effects that you should report to your care team as soon as possible: Allergic reactions--skin rash, itching, hives, swelling of the face, lips, tongue, or throat Heart attack--pain or tightness in the chest, shoulders, arms, or jaw, nausea, shortness of breath, cold or clammy skin, feeling faint or  lightheaded Heart failure--shortness of breath, swelling of the ankles, feet, or hands, sudden weight gain, unusual weakness or fatigue Heart rhythm changes--fast or irregular heartbeat, dizziness, feeling faint or lightheaded, chest pain, trouble breathing High ammonia level--unusual weakness or fatigue, confusion, loss of appetite, nausea, vomiting, seizures Infection--fever, chills, cough, sore throat, wounds that don't heal, pain or trouble when passing urine, general feeling of discomfort or being unwell Low red blood cell level--unusual weakness or fatigue, dizziness, headache, trouble breathing Pain, tingling, or numbness in the hands or feet, muscle weakness, change in vision, confusion or trouble speaking, loss of balance or coordination, trouble walking, seizures Redness, swelling, and blistering of the skin over hands and feet Severe or prolonged diarrhea Unusual bruising or bleeding Side effects that usually do not require medical attention (report to your care team if they continue or are bothersome): Dry skin Headache Increased tears Nausea Pain, redness, or swelling with sores inside the mouth or throat Sensitivity to light Vomiting This list may not describe all possible side effects. Call your doctor for medical advice about side effects. You may report side effects to FDA at 1-800-FDA-1088. Where should I keep my medication? This medication is given in a hospital or clinic. It will not be stored at home.  NOTE: This sheet is a summary. It may not cover all possible information. If you have questions about this medicine, talk to your doctor, pharmacist, or health care provider.  2024 Elsevier/Gold Standard (2022-04-04 00:00:00)      To help prevent nausea and vomiting after your treatment, we encourage you to take your nausea medication as directed.  BELOW ARE SYMPTOMS THAT SHOULD BE REPORTED IMMEDIATELY: *FEVER GREATER THAN 100.4 F (38 C) OR HIGHER *CHILLS OR  SWEATING *NAUSEA AND VOMITING THAT IS NOT CONTROLLED WITH YOUR NAUSEA MEDICATION *UNUSUAL SHORTNESS OF BREATH *UNUSUAL BRUISING OR BLEEDING *URINARY PROBLEMS (pain or burning when urinating, or frequent urination) *BOWEL PROBLEMS (unusual diarrhea, constipation, pain near the anus) TENDERNESS IN MOUTH AND THROAT WITH OR WITHOUT PRESENCE OF ULCERS (sore throat, sores in mouth, or a toothache) UNUSUAL RASH, SWELLING OR PAIN  UNUSUAL VAGINAL DISCHARGE OR ITCHING   Items with * indicate a potential emergency and should be followed up as soon as possible or go to the Emergency Department if any problems should occur.  Please show the CHEMOTHERAPY ALERT CARD or IMMUNOTHERAPY ALERT CARD at check-in to the Emergency Department and triage nurse.  Should you have questions after your visit or need to cancel or reschedule your appointment, please contact [facility] [place] - A DEPT OF Eligha Bridegroom [facility] [phone]  and follow the prompts.  Office hours are 8:00 a.m. to 4:30 p.m. Monday - Friday. Please note that voicemails left after 4:00 p.m. may not be returned until the following business day.  We are closed weekends and major holidays. You have access to a nurse at all times for urgent questions. Please call the main number to the clinic [phone] and follow the prompts.  For any non-urgent questions, you may also contact your provider using MyChart. We now offer e-Visits for anyone 74 and older to request care online for non-urgent symptoms. For details visit [email or web address].   Also download the MyChart app! Go to the app store, search "MyChart", open the app, select [place], and log in with your MyChart username and password.

## 2024-12-26 NOTE — Progress Notes (Signed)
 Patient presents today for pump d/c. Vital signs are stable. Port a cath site clean, dry, and intact. Port flushed with 20 mls of normal saline. Needle removed intact. Band aid applied. Patient has no concerns or questions at this time. Discharged from clinic ambulatory and in stable condition. Patient alert and oriented.

## 2024-12-31 ENCOUNTER — Other Ambulatory Visit: Payer: Self-pay | Admitting: Oncology

## 2024-12-31 DIAGNOSIS — Z95828 Presence of other vascular implants and grafts: Secondary | ICD-10-CM

## 2024-12-31 DIAGNOSIS — C180 Malignant neoplasm of cecum: Secondary | ICD-10-CM

## 2025-01-06 ENCOUNTER — Other Ambulatory Visit: Payer: Self-pay

## 2025-01-07 ENCOUNTER — Inpatient Hospital Stay

## 2025-01-07 ENCOUNTER — Inpatient Hospital Stay: Admitting: Oncology

## 2025-01-07 DIAGNOSIS — C180 Malignant neoplasm of cecum: Secondary | ICD-10-CM

## 2025-01-07 DIAGNOSIS — Z95828 Presence of other vascular implants and grafts: Secondary | ICD-10-CM

## 2025-01-07 NOTE — Progress Notes (Unsigned)
 Created in error, patient canceled

## 2025-01-08 ENCOUNTER — Encounter (HOSPITAL_COMMUNITY): Payer: Self-pay | Admitting: Oncology

## 2025-01-08 ENCOUNTER — Other Ambulatory Visit: Payer: Self-pay

## 2025-01-08 ENCOUNTER — Encounter: Payer: Self-pay | Admitting: Oncology

## 2025-01-09 ENCOUNTER — Inpatient Hospital Stay

## 2025-01-11 ENCOUNTER — Encounter: Payer: Self-pay | Admitting: *Deleted

## 2025-01-12 ENCOUNTER — Inpatient Hospital Stay: Attending: Hematology

## 2025-01-12 ENCOUNTER — Inpatient Hospital Stay: Admitting: Oncology

## 2025-01-12 ENCOUNTER — Inpatient Hospital Stay

## 2025-01-14 ENCOUNTER — Other Ambulatory Visit: Payer: Self-pay | Admitting: *Deleted

## 2025-01-14 ENCOUNTER — Telehealth: Payer: Self-pay | Admitting: Pharmacy Technician

## 2025-01-14 ENCOUNTER — Inpatient Hospital Stay

## 2025-01-14 ENCOUNTER — Inpatient Hospital Stay: Attending: Hematology

## 2025-01-14 ENCOUNTER — Encounter: Payer: Self-pay | Admitting: Oncology

## 2025-01-14 ENCOUNTER — Other Ambulatory Visit: Payer: Self-pay | Admitting: Oncology

## 2025-01-14 ENCOUNTER — Ambulatory Visit (HOSPITAL_COMMUNITY)
Admission: RE | Admit: 2025-01-14 | Discharge: 2025-01-14 | Disposition: A | Source: Ambulatory Visit | Attending: Oncology

## 2025-01-14 ENCOUNTER — Inpatient Hospital Stay: Admitting: Oncology

## 2025-01-14 ENCOUNTER — Encounter (HOSPITAL_COMMUNITY): Payer: Self-pay | Admitting: Oncology

## 2025-01-14 ENCOUNTER — Other Ambulatory Visit (HOSPITAL_COMMUNITY): Payer: Self-pay

## 2025-01-14 VITALS — HR 112

## 2025-01-14 VITALS — BP 114/75 | HR 122 | Temp 96.2°F | Resp 24 | Wt 170.4 lb

## 2025-01-14 DIAGNOSIS — R0600 Dyspnea, unspecified: Secondary | ICD-10-CM | POA: Diagnosis not present

## 2025-01-14 DIAGNOSIS — Z720 Tobacco use: Secondary | ICD-10-CM

## 2025-01-14 DIAGNOSIS — K1230 Oral mucositis (ulcerative), unspecified: Secondary | ICD-10-CM

## 2025-01-14 DIAGNOSIS — R197 Diarrhea, unspecified: Secondary | ICD-10-CM | POA: Diagnosis not present

## 2025-01-14 DIAGNOSIS — R000 Tachycardia, unspecified: Secondary | ICD-10-CM | POA: Diagnosis not present

## 2025-01-14 DIAGNOSIS — Z95828 Presence of other vascular implants and grafts: Secondary | ICD-10-CM

## 2025-01-14 DIAGNOSIS — J189 Pneumonia, unspecified organism: Secondary | ICD-10-CM

## 2025-01-14 DIAGNOSIS — G4709 Other insomnia: Secondary | ICD-10-CM

## 2025-01-14 DIAGNOSIS — D649 Anemia, unspecified: Secondary | ICD-10-CM

## 2025-01-14 DIAGNOSIS — C180 Malignant neoplasm of cecum: Secondary | ICD-10-CM

## 2025-01-14 DIAGNOSIS — C189 Malignant neoplasm of colon, unspecified: Secondary | ICD-10-CM | POA: Diagnosis not present

## 2025-01-14 DIAGNOSIS — I2699 Other pulmonary embolism without acute cor pulmonale: Secondary | ICD-10-CM

## 2025-01-14 LAB — CBC WITH DIFFERENTIAL/PLATELET
Abs Immature Granulocytes: 0.12 10*3/uL — ABNORMAL HIGH (ref 0.00–0.07)
Basophils Absolute: 0.2 10*3/uL — ABNORMAL HIGH (ref 0.0–0.1)
Basophils Relative: 2 %
Eosinophils Absolute: 0.6 10*3/uL — ABNORMAL HIGH (ref 0.0–0.5)
Eosinophils Relative: 7 %
HCT: 34.9 % — ABNORMAL LOW (ref 39.0–52.0)
Hemoglobin: 10.7 g/dL — ABNORMAL LOW (ref 13.0–17.0)
Immature Granulocytes: 2 %
Lymphocytes Relative: 28 %
Lymphs Abs: 2.3 10*3/uL (ref 0.7–4.0)
MCH: 27.9 pg (ref 26.0–34.0)
MCHC: 30.7 g/dL (ref 30.0–36.0)
MCV: 90.9 fL (ref 80.0–100.0)
Monocytes Absolute: 1.3 10*3/uL — ABNORMAL HIGH (ref 0.1–1.0)
Monocytes Relative: 15 %
Neutro Abs: 3.8 10*3/uL (ref 1.7–7.7)
Neutrophils Relative %: 46 %
Platelets: 247 10*3/uL (ref 150–400)
RBC: 3.84 MIL/uL — ABNORMAL LOW (ref 4.22–5.81)
RDW: 20 % — ABNORMAL HIGH (ref 11.5–15.5)
WBC: 8.2 10*3/uL (ref 4.0–10.5)
nRBC: 0 % (ref 0.0–0.2)

## 2025-01-14 LAB — URINALYSIS, DIPSTICK ONLY
Bilirubin Urine: NEGATIVE
Glucose, UA: NEGATIVE mg/dL
Hgb urine dipstick: NEGATIVE
Ketones, ur: NEGATIVE mg/dL
Leukocytes,Ua: NEGATIVE
Nitrite: NEGATIVE
Protein, ur: 100 mg/dL — AB
Specific Gravity, Urine: 1.025 (ref 1.005–1.030)
pH: 5 (ref 5.0–8.0)

## 2025-01-14 LAB — COMPREHENSIVE METABOLIC PANEL WITH GFR
ALT: 8 U/L (ref 0–44)
AST: 49 U/L — ABNORMAL HIGH (ref 15–41)
Albumin: 3.3 g/dL — ABNORMAL LOW (ref 3.5–5.0)
Alkaline Phosphatase: 74 U/L (ref 38–126)
Anion gap: 20 — ABNORMAL HIGH (ref 5–15)
BUN: 32 mg/dL — ABNORMAL HIGH (ref 8–23)
CO2: 17 mmol/L — ABNORMAL LOW (ref 22–32)
Calcium: 9.1 mg/dL (ref 8.9–10.3)
Chloride: 103 mmol/L (ref 98–111)
Creatinine, Ser: 1.77 mg/dL — ABNORMAL HIGH (ref 0.61–1.24)
GFR, Estimated: 42 mL/min — ABNORMAL LOW
Glucose, Bld: 92 mg/dL (ref 70–99)
Potassium: 3.9 mmol/L (ref 3.5–5.1)
Sodium: 139 mmol/L (ref 135–145)
Total Bilirubin: 0.5 mg/dL (ref 0.0–1.2)
Total Protein: 8.2 g/dL — ABNORMAL HIGH (ref 6.5–8.1)

## 2025-01-14 LAB — MAGNESIUM: Magnesium: 2.5 mg/dL — ABNORMAL HIGH (ref 1.7–2.4)

## 2025-01-14 MED ORDER — OXYCODONE HCL 10 MG PO TABS: 10.0000 mg | ORAL_TABLET | ORAL | 0 refills | Status: AC | PRN

## 2025-01-14 MED ORDER — IOHEXOL 350 MG/ML SOLN
60.0000 mL | Freq: Once | INTRAVENOUS | Status: AC | PRN
  Administered 2025-01-14: 60 mL via INTRAVENOUS

## 2025-01-14 MED ORDER — APIXABAN (ELIQUIS) VTE STARTER PACK (10MG AND 5MG): ORAL_TABLET | ORAL | 0 refills | Status: AC

## 2025-01-14 MED ORDER — HEPARIN SOD (PORK) LOCK FLUSH 100 UNIT/ML IV SOLN
INTRAVENOUS | Status: AC
  Filled 2025-01-14: qty 5

## 2025-01-14 MED ORDER — AZITHROMYCIN 250 MG PO TABS: ORAL_TABLET | ORAL | 0 refills | Status: AC

## 2025-01-14 MED ORDER — AMOXICILLIN-POT CLAVULANATE 875-125 MG PO TABS: 1.0000 | ORAL_TABLET | Freq: Two times a day (BID) | ORAL | 0 refills | Status: AC

## 2025-01-14 NOTE — Addendum Note (Signed)
 Addended by: JOSHUA IVANOFF E on: 01/14/2025 11:57 AM   Modules accepted: Orders

## 2025-01-14 NOTE — Progress Notes (Signed)
 Phoned patient to notify of CT angio scan results of PE and probable pneumonia. Informed that a prescription for Eliquis  has been sent for the blood clot, as well as Zithromax  and Augmentin  to treat pneumonia. Pt verbalizes understanding. Also informed to return to the clinic on 01/19/25 at 0830 for tx.

## 2025-01-14 NOTE — Progress Notes (Signed)
 No treatment today per Dr. Davonna.  Patient will go to radiology for a stat CT to rule out PE.

## 2025-01-14 NOTE — Progress Notes (Signed)
 Patients port flushed without difficulty.  Good blood return noted with no bruising or swelling noted at site. Patient remains accessed for treatment.

## 2025-01-14 NOTE — Telephone Encounter (Signed)
 Oral Oncology Patient Advocate Encounter  After completing a benefits investigation, prior authorization for APIXABAN  (ELIQUIS ) VTE STARTER PACK (10MG  AND 5MG )  is not required at this time through [facility] Part D.  Patient should be able to fill through his preferred pharmacy.   Shateria Paternostro (Patty) Chet Burnet, CPhT  [facility], Zelda Salmon, [facility] - Oral Chemotherapy Pharmacy Technician III Certified Patient Advocate Phone: [phone]  Fax: [phone]

## 2025-01-15 LAB — CEA: CEA: 301 ng/mL — ABNORMAL HIGH (ref 0.0–4.7)

## 2025-01-16 ENCOUNTER — Inpatient Hospital Stay

## 2025-01-20 ENCOUNTER — Inpatient Hospital Stay

## 2025-01-20 ENCOUNTER — Inpatient Hospital Stay: Attending: Hematology

## 2025-01-21 ENCOUNTER — Inpatient Hospital Stay

## 2025-01-21 ENCOUNTER — Inpatient Hospital Stay: Attending: Hematology

## 2025-01-22 ENCOUNTER — Inpatient Hospital Stay

## 2025-01-23 ENCOUNTER — Inpatient Hospital Stay

## 2025-01-28 ENCOUNTER — Inpatient Hospital Stay

## 2025-01-30 ENCOUNTER — Inpatient Hospital Stay

## 2025-02-04 ENCOUNTER — Inpatient Hospital Stay

## 2025-02-04 ENCOUNTER — Inpatient Hospital Stay: Admitting: Oncology

## 2025-02-06 ENCOUNTER — Inpatient Hospital Stay

## 2025-02-11 ENCOUNTER — Inpatient Hospital Stay

## 2025-02-11 ENCOUNTER — Inpatient Hospital Stay: Admitting: Oncology

## 2025-02-11 ENCOUNTER — Inpatient Hospital Stay: Attending: Hematology

## 2025-02-13 ENCOUNTER — Inpatient Hospital Stay

## 2025-02-18 ENCOUNTER — Inpatient Hospital Stay

## 2025-02-18 ENCOUNTER — Inpatient Hospital Stay: Attending: Hematology

## 2025-02-20 ENCOUNTER — Inpatient Hospital Stay

## 2025-02-25 ENCOUNTER — Inpatient Hospital Stay

## 2025-02-27 ENCOUNTER — Inpatient Hospital Stay
